# Patient Record
Sex: Female | Born: 1975
Health system: Southern US, Community
[De-identification: ages and names within clinical notes are randomized; demographics above are authoritative.]

## PROBLEM LIST (undated history)

## (undated) DIAGNOSIS — F419 Anxiety disorder, unspecified: Secondary | ICD-10-CM

## (undated) DIAGNOSIS — K648 Other hemorrhoids: Secondary | ICD-10-CM

## (undated) DIAGNOSIS — K219 Gastro-esophageal reflux disease without esophagitis: Secondary | ICD-10-CM

## (undated) DIAGNOSIS — A498 Other bacterial infections of unspecified site: Secondary | ICD-10-CM

## (undated) DIAGNOSIS — F329 Major depressive disorder, single episode, unspecified: Secondary | ICD-10-CM

## (undated) DIAGNOSIS — J45909 Unspecified asthma, uncomplicated: Secondary | ICD-10-CM

## (undated) DIAGNOSIS — N189 Chronic kidney disease, unspecified: Secondary | ICD-10-CM

## (undated) DIAGNOSIS — R87619 Unspecified abnormal cytological findings in specimens from cervix uteri: Secondary | ICD-10-CM

## (undated) DIAGNOSIS — K859 Acute pancreatitis without necrosis or infection, unspecified: Secondary | ICD-10-CM

## (undated) DIAGNOSIS — D219 Benign neoplasm of connective and other soft tissue, unspecified: Secondary | ICD-10-CM

## (undated) DIAGNOSIS — M1990 Unspecified osteoarthritis, unspecified site: Secondary | ICD-10-CM

## (undated) DIAGNOSIS — A879 Viral meningitis, unspecified: Secondary | ICD-10-CM

## (undated) DIAGNOSIS — N390 Urinary tract infection, site not specified: Secondary | ICD-10-CM

## (undated) DIAGNOSIS — T7840XA Allergy, unspecified, initial encounter: Secondary | ICD-10-CM

## (undated) DIAGNOSIS — G43909 Migraine, unspecified, not intractable, without status migrainosus: Secondary | ICD-10-CM

## (undated) DIAGNOSIS — I700 Atherosclerosis of aorta: Secondary | ICD-10-CM

## (undated) DIAGNOSIS — N809 Endometriosis, unspecified: Secondary | ICD-10-CM

## (undated) DIAGNOSIS — N8003 Adenomyosis of the uterus: Secondary | ICD-10-CM

## (undated) DIAGNOSIS — K449 Diaphragmatic hernia without obstruction or gangrene: Secondary | ICD-10-CM

## (undated) DIAGNOSIS — E785 Hyperlipidemia, unspecified: Secondary | ICD-10-CM

## (undated) DIAGNOSIS — I10 Essential (primary) hypertension: Secondary | ICD-10-CM

## (undated) DIAGNOSIS — D649 Anemia, unspecified: Secondary | ICD-10-CM

## (undated) DIAGNOSIS — K5790 Diverticulosis of intestine, part unspecified, without perforation or abscess without bleeding: Secondary | ICD-10-CM

## (undated) DIAGNOSIS — F32A Depression, unspecified: Secondary | ICD-10-CM

## (undated) DIAGNOSIS — M797 Fibromyalgia: Secondary | ICD-10-CM

## (undated) DIAGNOSIS — I639 Cerebral infarction, unspecified: Secondary | ICD-10-CM

## (undated) DIAGNOSIS — D126 Benign neoplasm of colon, unspecified: Secondary | ICD-10-CM

## (undated) DIAGNOSIS — E119 Type 2 diabetes mellitus without complications: Secondary | ICD-10-CM

## (undated) HISTORY — PX: UPPER GASTROINTESTINAL ENDOSCOPY: SHX188

## (undated) HISTORY — DX: Other bacterial infections of unspecified site: A49.8

## (undated) HISTORY — DX: Hyperlipidemia, unspecified: E78.5

## (undated) HISTORY — DX: Unspecified asthma, uncomplicated: J45.909

## (undated) HISTORY — DX: Allergy, unspecified, initial encounter: T78.40XA

## (undated) HISTORY — DX: Atherosclerosis of aorta: I70.0

## (undated) HISTORY — DX: Diverticulosis of intestine, part unspecified, without perforation or abscess without bleeding: K57.90

## (undated) HISTORY — DX: Viral meningitis, unspecified: A87.9

## (undated) HISTORY — DX: Other hemorrhoids: K64.8

## (undated) HISTORY — PX: CERVICAL BIOPSY  W/ LOOP ELECTRODE EXCISION: SUR135

## (undated) HISTORY — DX: Anxiety disorder, unspecified: F41.9

## (undated) HISTORY — DX: Cerebral infarction, unspecified: I63.9

## (undated) HISTORY — DX: Gastro-esophageal reflux disease without esophagitis: K21.9

## (undated) HISTORY — DX: Fibromyalgia: M79.7

## (undated) HISTORY — DX: Endometriosis, unspecified: N80.9

## (undated) HISTORY — DX: Diaphragmatic hernia without obstruction or gangrene: K44.9

## (undated) HISTORY — PX: WISDOM TOOTH EXTRACTION: SHX21

## (undated) HISTORY — DX: Acute pancreatitis without necrosis or infection, unspecified: K85.90

## (undated) HISTORY — PX: COLONOSCOPY: SHX174

## (undated) HISTORY — DX: Anemia, unspecified: D64.9

## (undated) HISTORY — DX: Depression, unspecified: F32.A

## (undated) HISTORY — DX: Major depressive disorder, single episode, unspecified: F32.9

## (undated) HISTORY — DX: Benign neoplasm of colon, unspecified: D12.6

## (undated) HISTORY — DX: Adenomyosis of the uterus: N80.03

## (undated) HISTORY — DX: Benign neoplasm of connective and other soft tissue, unspecified: D21.9

## (undated) HISTORY — DX: Unspecified abnormal cytological findings in specimens from cervix uteri: R87.619

## (undated) HISTORY — DX: Chronic kidney disease, unspecified: N18.9

## (undated) HISTORY — DX: Migraine, unspecified, not intractable, without status migrainosus: G43.909

## (undated) HISTORY — PX: URINARY SURGERY: SHX2626

---
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
## 2000-10-26 HISTORY — PX: OTHER SURGICAL HISTORY: SHX169

## 2009-10-26 HISTORY — PX: CHOLECYSTECTOMY: SHX55

## 2014-06-06 DIAGNOSIS — I10 Essential (primary) hypertension: Secondary | ICD-10-CM | POA: Insufficient documentation

## 2014-06-06 DIAGNOSIS — E559 Vitamin D deficiency, unspecified: Secondary | ICD-10-CM | POA: Insufficient documentation

## 2014-07-03 DIAGNOSIS — R760 Raised antibody titer: Secondary | ICD-10-CM | POA: Insufficient documentation

## 2014-09-12 DIAGNOSIS — IMO0002 Reserved for concepts with insufficient information to code with codable children: Secondary | ICD-10-CM | POA: Insufficient documentation

## 2014-09-12 DIAGNOSIS — E119 Type 2 diabetes mellitus without complications: Secondary | ICD-10-CM | POA: Insufficient documentation

## 2014-09-12 DIAGNOSIS — E1165 Type 2 diabetes mellitus with hyperglycemia: Secondary | ICD-10-CM | POA: Insufficient documentation

## 2016-10-12 ENCOUNTER — Emergency Department (HOSPITAL_COMMUNITY)
Admission: EM | Admit: 2016-10-12 | Discharge: 2016-10-12 | Disposition: A | Discharge status: Home / Self Care | Payer: Self-pay | Attending: Emergency Medicine | Admitting: Emergency Medicine

## 2016-10-12 ENCOUNTER — Telehealth: Payer: Self-pay | Admitting: *Deleted

## 2016-10-12 ENCOUNTER — Encounter (HOSPITAL_COMMUNITY): Payer: Self-pay | Admitting: Emergency Medicine

## 2016-10-12 DIAGNOSIS — L739 Follicular disorder, unspecified: Secondary | ICD-10-CM | POA: Insufficient documentation

## 2016-10-12 DIAGNOSIS — M5431 Sciatica, right side: Secondary | ICD-10-CM | POA: Insufficient documentation

## 2016-10-12 DIAGNOSIS — E119 Type 2 diabetes mellitus without complications: Secondary | ICD-10-CM | POA: Insufficient documentation

## 2016-10-12 DIAGNOSIS — I10 Essential (primary) hypertension: Secondary | ICD-10-CM | POA: Insufficient documentation

## 2016-10-12 DIAGNOSIS — Z79899 Other long term (current) drug therapy: Secondary | ICD-10-CM | POA: Insufficient documentation

## 2016-10-12 DIAGNOSIS — F17210 Nicotine dependence, cigarettes, uncomplicated: Secondary | ICD-10-CM | POA: Insufficient documentation

## 2016-10-12 HISTORY — DX: Essential (primary) hypertension: I10

## 2016-10-12 HISTORY — DX: Type 2 diabetes mellitus without complications: E11.9

## 2016-10-12 HISTORY — DX: Unspecified osteoarthritis, unspecified site: M19.90

## 2016-10-12 HISTORY — DX: Urinary tract infection, site not specified: N39.0

## 2016-10-12 LAB — BASIC METABOLIC PANEL
Anion gap: 7 (ref 5–15)
BUN: 9 mg/dL (ref 6–20)
CO2: 23 mmol/L (ref 22–32)
Calcium: 8.6 mg/dL — ABNORMAL LOW (ref 8.9–10.3)
Chloride: 110 mmol/L (ref 101–111)
Creatinine, Ser: 0.64 mg/dL (ref 0.44–1.00)
GFR calc Af Amer: >60 mL/min (ref >60)
GFR calc non Af Amer: >60 mL/min (ref >60)
Glucose, Bld: 144 mg/dL — ABNORMAL HIGH (ref 65–99)
Potassium: 4.1 mmol/L (ref 3.5–5.1)
Sodium: 140 mmol/L (ref 135–145)

## 2016-10-12 LAB — URINALYSIS, ROUTINE W REFLEX MICROSCOPIC
Bilirubin Urine: NEGATIVE
Glucose, UA: NEGATIVE mg/dL
Hgb urine dipstick: NEGATIVE
Ketones, ur: NEGATIVE mg/dL
Leukocytes, UA: NEGATIVE
Nitrite: NEGATIVE
Protein, ur: NEGATIVE mg/dL
Specific Gravity, Urine: 1.017 (ref 1.005–1.030)
pH: 6 (ref 5.0–8.0)

## 2016-10-12 LAB — CBC WITH DIFFERENTIAL/PLATELET
Basophils Absolute: 0 10*3/uL (ref 0.0–0.1)
Basophils Relative: 0 %
Eosinophils Absolute: 0.1 10*3/uL (ref 0.0–0.7)
Eosinophils Relative: 1 %
HCT: 34.7 % — ABNORMAL LOW (ref 36.0–46.0)
Hemoglobin: 11.5 g/dL — ABNORMAL LOW (ref 12.0–15.0)
Lymphocytes Relative: 30 %
Lymphs Abs: 2.9 10*3/uL (ref 0.7–4.0)
MCH: 28 pg (ref 26.0–34.0)
MCHC: 33.1 g/dL (ref 30.0–36.0)
MCV: 84.6 fL (ref 78.0–100.0)
Monocytes Absolute: 0.6 10*3/uL (ref 0.1–1.0)
Monocytes Relative: 6 %
Neutro Abs: 5.9 10*3/uL (ref 1.7–7.7)
Neutrophils Relative %: 63 %
Platelets: 279 10*3/uL (ref 150–400)
RBC: 4.1 MIL/uL (ref 3.87–5.11)
RDW: 17.2 % — ABNORMAL HIGH (ref 11.5–15.5)
WBC: 9.4 10*3/uL (ref 4.0–10.5)

## 2016-10-12 LAB — I-STAT CG4 LACTIC ACID, ED: Lactic Acid, Venous: 1.34 mmol/L (ref 0.5–1.9)

## 2016-10-12 MED ORDER — OXYCODONE-ACETAMINOPHEN 5-325 MG PO TABS
2.0000 | ORAL_TABLET | Freq: Once | ORAL | Status: AC
  Administered 2016-10-12: 2 via ORAL
  Filled 2016-10-12: qty 2

## 2016-10-12 MED ORDER — FLUCONAZOLE 150 MG PO TABS: 150.0000 mg | ORAL_TABLET | Freq: Once | ORAL | 0 refills | Status: AC

## 2016-10-12 MED ORDER — CHLORHEXIDINE GLUCONATE 4 % EX LIQD: Freq: Every day | CUTANEOUS | 0 refills | Status: DC | PRN

## 2016-10-12 MED ORDER — OXYCODONE-ACETAMINOPHEN 5-325 MG PO TABS: 1.0000 | ORAL_TABLET | Freq: Four times a day (QID) | ORAL | 0 refills | Status: DC | PRN

## 2016-10-12 MED ORDER — DOXYCYCLINE HYCLATE 100 MG PO CAPS: 100.0000 mg | ORAL_CAPSULE | Freq: Two times a day (BID) | ORAL | 0 refills | Status: DC

## 2016-10-12 MED ORDER — CLINDAMYCIN HCL 300 MG PO CAPS: 300.0000 mg | ORAL_CAPSULE | Freq: Three times a day (TID) | ORAL | 0 refills | Status: DC

## 2016-10-12 MED FILL — DOXYCYCLINE 100 MG TABLET: 100 | 7 days supply | Qty: 14 | Fill #0

## 2016-10-12 MED FILL — FLUCONAZOLE 150 MG TABLET: 150 | 1 days supply | Qty: 1 | Fill #0

## 2016-10-12 NOTE — ED Provider Notes (Signed)
TIME SEEN: 5:00 AM  CHIEF COMPLAINT: Multiple complaints  HPI: Pt is a 40 y.o. obese female with history of non-insulin dependent diabetes, hypertension, recurrent urinary tract infections who presents to the emergency department with multiple different complaints. Patient reports that she is here visiting her daughter who is in school here. She states she is here from [place] and states that she thinks that she may establish care here. She is asking for resources to help get her insurance in [place]. States that she had Medicaid in [place].   Patient also complaining of multiple small abscesses to her buttock area that have been present for several months. She has had multiple prescriptions of antibiotics that she has had leftover has tried Bactrim, clindamycin, Cipro. She feels like these areas are getting smaller but that "a new one popped up last night". She denies any fevers, chills. No drainage from these areas. She states that her blood sugars have been under control. She is not on insulin.   Patient also complaining of lower back pain that radiates down her right leg. No numbness, tingling, focal weakness, bowel or bladder incontinence, urinary retention, fever. No history of back surgery or epidural injections. No injury to her back that she is aware of. She is requesting an MRI.   Patient also reports that she has chronic bacterial vaginosis but denies any vaginal discharge or bleeding at this time. She states that metronidazole and clindamycin no longer work for her and she wants another option.  ROS: See HPI Constitutional: no fever  Eyes: no drainage  ENT: no runny nose   Cardiovascular:  no chest pain  Resp: no SOB  GI: no vomiting GU: no hematuria Integumentary: no rash  Allergy: no hives  Musculoskeletal: no leg swelling  Neurological: no slurred speech ROS otherwise negative  PAST MEDICAL HISTORY/PAST SURGICAL HISTORY:  Past Medical History:  Diagnosis Date   . Arthritis   . Chronic UTI   . Diabetes mellitus without complication ([place])   . Hypertension     MEDICATIONS:  Prior to Admission medications   Not on File    ALLERGIES:  Allergies  Allergen Reactions  . Metformin And Related Nausea And Vomiting  . Penicillins   . Xanax [Alprazolam]     SOCIAL HISTORY:  Social History  Substance Use Topics  . Smoking status: Current Every Day Smoker    Packs/day: 1.00    Years: 20.00    Types: Cigarettes  . Smokeless tobacco: Never Used  . Alcohol use Yes    FAMILY HISTORY: History reviewed. No pertinent family history.  EXAM: BP 136/87 (BP Location: Left Arm)   Pulse 83   Temp 98.5 F (36.9 C) (Oral)   Resp 18   Ht 5\' 4"  (1.626 m)   Wt 185 lb (83.9 kg)   LMP 10/02/2016 (Exact Date)   SpO2 100%   BMI 31.76 kg/m  CONSTITUTIONAL: Alert and oriented and responds appropriately to questions. Well-appearing; well-nourished, Obese, afebrile, nontoxic HEAD: Normocephalic EYES: Conjunctivae clear, PERRL, EOMI ENT: normal nose; no rhinorrhea; moist mucous membranes NECK: Supple, no meningismus, no nuchal rigidity, no LAD  CARD: RRR; S1 and S2 appreciated; no murmurs, no clicks, no rubs, no gallops RESP: Normal chest excursion without splinting or tachypnea; breath sounds clear and equal bilaterally; no wheezes, no rhonchi, no rales, no hypoxia or respiratory distress, speaking full sentences ABD/GI: Normal bowel sounds; non-distended; soft, non-tender, no rebound, no guarding, no peritoneal signs, no hepatosplenomegaly BACK:  The back appears normal  and is non-tender to palpation, there is no CVA tenderness EXT: Normal ROM in all joints; non-tender to palpation; no edema; normal capillary refill; no cyanosis, no calf tenderness or swelling    SKIN: Normal color for age and race; warm; no rash, patient has 3 less than 0.5 cm slightly raised erythematous areas to her buttocks with no fluctuance or induration, these areas do not go near  the rectum, no pilonidal cyst NEURO: Moves all extremities equally, sensation to light touch intact diffusely, cranial nerves II through XII intact, normal speech, ambulates with a normal gait without any assistance, no saddle anesthesia PSYCH: The patient's mood and manner are appropriate. Grooming and personal hygiene are appropriate.  MEDICAL DECISION MAKING: Patient here with multiple various complaints that all seem to be nonemergent. Patient states that she is concerned about her back pain. It seems to be sciatica. She has no injury to suggest fracture and no neurologic deficits to suggest cauda equina, epidural abscess or hematoma, discitis, transverse myelitis. I do not feel she needs an emergent MRI but have recommended she follow-up with a PCP for further follow-up. Have advised she continue ibuprofen 800 mg every 8 hours and I will discharge her with a short prescription for Percocet. She drove herself to the emergency department therefore cannot provide her narcotics at this time. She seems very upset that I will not obtain an MRI but have discussed with her at length why she does not need it emergently.   Patient also complaining of these small areas to her buttocks that appear to be folliculitis. She thinks that she is resistant to clindamycin. We'll give her prescription for doxycycline and Hibiclens. There are no areas that need to be incised and drained. No surrounding cellulitis. It does not appear to involve the rectum. There is no soft tissue gas. She is very well-appearing. She has no fevers or systemic symptoms. She denies nausea, vomiting or diarrhea. She states that she thinks that her infection could have spread throughout her body. Discussed with her that we can check her labs today but I have low suspicion for bacteremia.    Patient also asking for something else for her chronic bacterial vaginosis. No current GU complaints. Have recommended over-the-counter boric acid vaginal  suppositories. Abdominal exam is benign.    ED PROGRESS: Patient's labs are reassuring. No leukocytosis. Glucose is 144. Lactate 1.34. I went back into the room to discuss this with patient and reassure her and she now states that she needs her urine evaluated because she thinks she has a UTI. States she has had dysuria but no hematuria or urinary frequency or urgency. We'll add on a urine sample.     Patient's urine shows no ketones, no blood and no sign of infection. Patient's nurse has attempted to discharge her and she has presented with multiple excuses of why she cannot leave including issues with paying for these prescriptions, follow-up. I did discuss with her previously that I will put in a case management and social work consult to help her establish a primary care provider in this area and help her with any outpatient needs. I have given her a list of primary care physicians in this area. Again I suspect that there is no life-threatening illness present and I do not feel she needs further emergent workup. I have previously discussed with her return precautions especially for her back. She verbalized understanding and is comfortable with this plan. I feel she is safe for discharge home.  At this time, I do not feel there is any life-threatening condition present. I have reviewed and discussed all results (EKG, imaging, lab, urine as appropriate) and exam findings with patient/family. I have reviewed nursing notes and appropriate previous records.  I feel the patient is safe to be discharged home without further emergent workup and can continue workup as an outpatient as needed. Discussed usual and customary return precautions. Patient/family verbalize understanding and are comfortable with this plan.  Outpatient follow-up has been provided. All questions have been answered.    [place], DO 10/12/16 [phone]

## 2016-10-12 NOTE — Telephone Encounter (Signed)
Spoke with pt regarding insurance options while visiting [place] and getting Rx filled.  EDCM advised pt that until she becomes a resident to of [place], she can not apply for [place]. [employer]; she may visit the [facility] to have Rx filled.  EDCM advised that she will need to discontinue Medicaid in [place] prior to moving to [facility] and applying for Medicaid in [place].  Pt verbalized understandingby repeating instructions given.

## 2016-10-12 NOTE — Discharge Instructions (Signed)
To find a primary care or specialty doctor please call [phone] or [phone] to access "[facility] Find a Doctor Service." ° °You may also go on the [facility] website at [email or web address] ° °There are also multiple [facility], [facility] and [facility] practices throughout the [facility] that are frequently accepting new patients. You may find a clinic that is close to your home and contact them. ° ° [facility] -  °[address] °[place] [address] °[phone] ° ° °[facility] -  °[address] °[place] [address] °[phone] ° ° °[facility] - °[address]  °[place]  [address] °[phone] ° ° °

## 2016-10-12 NOTE — ED Triage Notes (Signed)
Pt has chronic hx of lower back/R leg pain. Has diabetic sore to R foot and buttocks. Has been on clindamyacin and cipro that have seemed to be helping somewhat for leg pain and one sore to butttocks, but now developing another. Pt has extensive hx and additional complaints. Recently moved from [place].

## 2016-10-20 ENCOUNTER — Emergency Department (HOSPITAL_COMMUNITY)
Admission: EM | Admit: 2016-10-20 | Discharge: 2016-10-20 | Disposition: A | Discharge status: Home / Self Care | Payer: Medicaid - Out of State | Attending: Emergency Medicine | Admitting: Emergency Medicine

## 2016-10-20 DIAGNOSIS — M5441 Lumbago with sciatica, right side: Secondary | ICD-10-CM | POA: Diagnosis not present

## 2016-10-20 DIAGNOSIS — Z760 Encounter for issue of repeat prescription: Secondary | ICD-10-CM | POA: Diagnosis not present

## 2016-10-20 DIAGNOSIS — E119 Type 2 diabetes mellitus without complications: Secondary | ICD-10-CM | POA: Insufficient documentation

## 2016-10-20 DIAGNOSIS — F17210 Nicotine dependence, cigarettes, uncomplicated: Secondary | ICD-10-CM | POA: Diagnosis not present

## 2016-10-20 DIAGNOSIS — I10 Essential (primary) hypertension: Secondary | ICD-10-CM | POA: Diagnosis not present

## 2016-10-20 DIAGNOSIS — M5431 Sciatica, right side: Secondary | ICD-10-CM

## 2016-10-20 MED ORDER — EXENATIDE ER 2 MG ~~LOC~~ PEN: 2.0000 mg | PEN_INJECTOR | SUBCUTANEOUS | 3 refills | Status: DC

## 2016-10-20 MED ORDER — EXENATIDE ER 2 MG/0.85ML ~~LOC~~ AUIJ: 2.0000 mg | AUTO-INJECTOR | SUBCUTANEOUS | 2 refills | Status: DC

## 2016-10-20 MED ORDER — METHYLPREDNISOLONE 4 MG PO TBPK: ORAL_TABLET | ORAL | 0 refills | Status: DC

## 2016-10-20 NOTE — ED Provider Notes (Signed)
[facility] DEPT Provider Note   CSN: [id number] Arrival date & time: 10/20/16  [id number]     History   Chief Complaint Chief Complaint  Patient presents with  . Sciatica    HPI Amber Rose is a 40 y.o. female.  HPI Patient reports she has had several months of lower back pain. It radiates to her right buttock and right leg. It is aching in quality. Pain is worse lying flat. Less painful and the patient is upright and moving about. No weakness numbness to the leg. Patient also has gotten a sporadic lesion on her buttock. She reports she has gotten multiple different antibiotics but it always comes back. Patient is primarily worried about an infection and possibly infection around her spinal cord. She reports that she has had extensive abdominal surgery after a bladder sling that she responded poorly to. She reports after having had such extensive medical problems now she gets concerned about possible complications. The patient has not had any fevers or chills. Blood sugars and A1 C's have been well-controlled. No nausea vomiting diarrhea or abdominal pain. Patient reports recurrent problems with bacterial vaginosis and vaginal yeast. Not acutely having discharge or drainage. No respiratory symptoms. Past Medical History:  Diagnosis Date  . Arthritis   . Chronic UTI   . Diabetes mellitus without complication ([place])   . Hypertension     There are no active problems to display for this patient.   No past surgical history on file.  OB History    No data available       Home Medications    Prior to Admission medications   Medication Sig Start Date End Date Taking? Authorizing Provider  chlorhexidine (HIBICLENS) 4 % external liquid Apply topically daily as needed. 10/12/16   Kristen N Ward, DO  doxycycline (VIBRAMYCIN) 100 MG capsule Take 1 capsule (100 mg total) by mouth 2 (two) times daily. 10/12/16   Kristen N Ward, DO  Exenatide ER (BYDUREON BCISE) 2 MG/0.85ML AUIJ  Inject 2 mg into the skin once a week. 10/20/16   Charlesetta Shanks, MD  Exenatide ER 2 MG PEN Inject 2 mg into the skin once a week. 10/20/16   Charlesetta Shanks, MD  methylPREDNISolone (MEDROL DOSEPAK) 4 MG TBPK tablet Per pack 10/20/16   Charlesetta Shanks, MD  oxyCODONE-acetaminophen (PERCOCET/ROXICET) 5-325 MG tablet Take 1-2 tablets by mouth every 6 (six) hours as needed. 10/12/16   Tama, DO    Family History No family history on file.  Social History Social History  Substance Use Topics  . Smoking status: Current Every Day Smoker    Packs/day: 1.00    Years: 20.00    Types: Cigarettes  . Smokeless tobacco: Never Used  . Alcohol use Yes     Allergies   Metformin and related; Penicillins; and Xanax [alprazolam]   Review of Systems Review of Systems  10 Systems reviewed and are negative for acute change except as noted in the HPI.  Physical Exam Updated Vital Signs BP 114/65 (BP Location: Right Arm)   Pulse 84   Temp 98.5 F (36.9 C)   Resp 16   LMP 10/02/2016 (Exact Date)   SpO2 96%   Physical Exam  Constitutional: She is oriented to person, place, and time. She appears well-developed and well-nourished. No distress.  Patient is alert and well appearance. No respiratory distress.  HENT:  Head: Normocephalic and atraumatic.  Eyes: Conjunctivae and EOM are normal.  Neck: Neck supple.  Cardiovascular: Normal rate, regular  rhythm, normal heart sounds and intact distal pulses.   No murmur heard. Pulmonary/Chest: Effort normal and breath sounds normal. No respiratory distress.  Abdominal: Soft. She exhibits no distension. There is no tenderness. There is no guarding.  Musculoskeletal: Normal range of motion. She exhibits tenderness. She exhibits no edema or deformity.  Patient endorses mild tenderness to palpation at approximately L3 to L5. Also endorses some tenderness to palpation of the paraspinous muscle bodies lateral on the right. No palpable anomaly. The  patient does not seem exquisitely tender. She is able to lie flat without expression of significant discomfort. The patient can go from supine to sitting up without significant discomfort. Strength testing and range of motion of the lower extremities is normal. No peripheral edema or swelling of the extremities. Skin condition of muscle quality of the lower extremities is very good.  Neurological: She is alert and oriented to person, place, and time. She exhibits normal muscle tone. Coordination normal.  Skin: Skin is warm and dry.  Examination of buttocks in the area of the patient's concern, shows only minor areas of hyper pigmentation suggestive of prior folliculitis. All at this time are well-healed without any pustule.  Psychiatric: She has a normal mood and affect.  Nursing note and vitals reviewed.    ED Treatments / Results  Labs (all labs ordered are listed, but only abnormal results are displayed) Labs Reviewed - No data to display  EKG  EKG Interpretation None       Radiology No results found.  Procedures Procedures (including critical care time)  Medications Ordered in ED Medications - No data to display   Initial Impression / Assessment and Plan / ED Course  I have reviewed the triage vital signs and the nursing notes.  Pertinent labs & imaging results that were available during my care of the patient were reviewed by me and considered in my medical decision making (see chart for details).  Clinical Course     Final Clinical Impressions(s) / ED Diagnoses   Final diagnoses:  Sciatica of right side  Medication refill  Patient's pain description is very consistent with sciatica. On physical examination there are no soft tissue anomalies or neurologic anomalies to suggest complications. Patient states primary concern is for infection because she gets this recurrent lesion on her buttock. At this time, there is only hyperpigmentation scarring. Based on the picture  she showed me on the condition of the lesion now, this is very consistent with a recurrent folliculitis. I have very low suspicion for spinal, epidural abscess. The patient has had pain for over several months without deteriorating condition and very well exam at this time. Diagnostic studies were done earlier this week without leukocytosis or UTI. Patient also needed a refill of her diabetes medication. She will try Solu-Medrol. She reports she already has scheduled follow-up on the 27th and the beginning of January.  New Prescriptions New Prescriptions   EXENATIDE ER (BYDUREON BCISE) 2 MG/0.85ML AUIJ    Inject 2 mg into the skin once a week.   EXENATIDE ER 2 MG PEN    Inject 2 mg into the skin once a week.   METHYLPREDNISOLONE (MEDROL DOSEPAK) 4 MG TBPK TABLET    Per pack     Charlesetta Shanks, MD 10/20/16 0830

## 2016-10-23 ENCOUNTER — Ambulatory Visit: Payer: Medicaid - Out of State | Attending: Family Medicine | Admitting: Family Medicine

## 2016-10-23 ENCOUNTER — Encounter: Payer: Self-pay | Admitting: Family Medicine

## 2016-10-23 VITALS — BP 130/82 | HR 74 | Temp 98.5°F | Ht 63.5 in | Wt 185.8 lb

## 2016-10-23 DIAGNOSIS — Z888 Allergy status to other drugs, medicaments and biological substances status: Secondary | ICD-10-CM | POA: Diagnosis not present

## 2016-10-23 DIAGNOSIS — E0800 Diabetes mellitus due to underlying condition with hyperosmolarity without nonketotic hyperglycemic-hyperosmolar coma (NKHHC): Secondary | ICD-10-CM

## 2016-10-23 DIAGNOSIS — E119 Type 2 diabetes mellitus without complications: Secondary | ICD-10-CM | POA: Insufficient documentation

## 2016-10-23 DIAGNOSIS — I10 Essential (primary) hypertension: Secondary | ICD-10-CM | POA: Diagnosis not present

## 2016-10-23 DIAGNOSIS — Z880 Allergy status to penicillin: Secondary | ICD-10-CM | POA: Diagnosis not present

## 2016-10-23 DIAGNOSIS — L0292 Furuncle, unspecified: Secondary | ICD-10-CM | POA: Diagnosis not present

## 2016-10-23 DIAGNOSIS — L0232 Furuncle of buttock: Secondary | ICD-10-CM | POA: Insufficient documentation

## 2016-10-23 DIAGNOSIS — M79604 Pain in right leg: Secondary | ICD-10-CM | POA: Insufficient documentation

## 2016-10-23 DIAGNOSIS — R232 Flushing: Secondary | ICD-10-CM | POA: Insufficient documentation

## 2016-10-23 DIAGNOSIS — L732 Hidradenitis suppurativa: Secondary | ICD-10-CM | POA: Insufficient documentation

## 2016-10-23 LAB — POCT GLYCOSYLATED HEMOGLOBIN (HGB A1C): Hemoglobin A1C: 5.6

## 2016-10-23 LAB — GLUCOSE, POCT (MANUAL RESULT ENTRY): POC Glucose: 117 mg/dl — AB (ref 70–99)

## 2016-10-23 MED ORDER — ACYCLOVIR 400 MG PO TABS: 400.0000 mg | ORAL_TABLET | Freq: Two times a day (BID) | ORAL | 3 refills | Status: DC

## 2016-10-23 MED ORDER — HYDROCHLOROTHIAZIDE 25 MG PO TABS: 25.0000 mg | ORAL_TABLET | Freq: Every day | ORAL | 3 refills | Status: DC

## 2016-10-23 MED ORDER — AMLODIPINE BESYLATE 10 MG PO TABS: 10.0000 mg | ORAL_TABLET | Freq: Every day | ORAL | 3 refills | Status: DC

## 2016-10-23 MED FILL — HYDROCHLOROTHIAZIDE 25 MG T: 25 | 30 days supply | Qty: 30 | Fill #0

## 2016-10-23 NOTE — Progress Notes (Signed)
Subjective:  Patient ID: Amber Rose, female    DOB: 12-May-1976  Age: 40 y.o. MRN: [id number]  CC: Leg Pain (right sided); lesion on left buttocks (present for 2 months); Facial Swelling; Diabetes; and Hypertension   HPI Amber Rose is a 40 year old female with a history of diabetes mellitus (A1c 5.6), hypertension who presents today to get established here; she recently moved from [place]. She was seen in the ED for sciatica and received a Medrol Dosepak which she is yet to commence. Describes the pain as in the right side of her lower back and radiates down her thigh medially.  She also complains of flushing and swelling of her face whenever she lies down and this begins as a sensation of pulsation then warms and subsequent swelling of the face but this resolved with she sits up. This has been intermittent and has been going on for years. She informs me that workup by her previous physicians have been nonrevealing.  Also concerned about a bump on her left butt cheek, left arm which have imprisoned for the last 1 month and seemed to improve when she was on an antibiotic but have not healed completely. She also has a mole on her face which has happened for years but thinks it is getting bigger; she does not want excision of the lesion but just thought she should mention it.  Past Medical History:  Diagnosis Date  . Arthritis   . Chronic UTI   . Diabetes mellitus without complication ([facility])   . Hypertension     History reviewed. No pertinent surgical history.  Allergies  Allergen Reactions  . Metformin And Related Nausea And Vomiting  . Penicillins   . Xanax [Alprazolam]      Outpatient Medications Prior to Visit  Medication Sig Dispense Refill  . Exenatide ER (BYDUREON BCISE) 2 MG/0.85ML AUIJ Inject 2 mg into the skin once a week. 4 pen 2  . chlorhexidine (HIBICLENS) 4 % external liquid Apply topically daily as needed. (Patient not taking: Reported on 10/23/2016)  120 mL 0  . methylPREDNISolone (MEDROL DOSEPAK) 4 MG TBPK tablet Per pack (Patient not taking: Reported on 10/23/2016) 21 tablet 0  . oxyCODONE-acetaminophen (PERCOCET/ROXICET) 5-325 MG tablet Take 1-2 tablets by mouth every 6 (six) hours as needed. (Patient not taking: Reported on 10/23/2016) 15 tablet 0  . doxycycline (VIBRAMYCIN) 100 MG capsule Take 1 capsule (100 mg total) by mouth 2 (two) times daily. 14 capsule 0  . Exenatide ER 2 MG PEN Inject 2 mg into the skin once a week. 4 each 3   No facility-administered medications prior to visit.     ROS Review of Systems  Constitutional: Negative for activity change, appetite change and fatigue.  HENT: Negative for congestion, sinus pressure and sore throat.   Eyes: Negative for visual disturbance.  Respiratory: Negative for cough, chest tightness, shortness of breath and wheezing.   Cardiovascular: Negative for chest pain and palpitations.  Gastrointestinal: Negative for abdominal distention, abdominal pain and constipation.  Endocrine: Negative for polydipsia.  Genitourinary: Negative for dysuria and frequency.  Musculoskeletal: Negative for arthralgias and back pain.  Skin: Positive for rash.  Neurological: Negative for tremors, light-headedness and numbness.  Hematological: Does not bruise/bleed easily.  Psychiatric/Behavioral: Negative for agitation and behavioral problems.    Objective:  BP 130/82 (BP Location: Right Arm, Patient Position: Sitting, Cuff Size: Small)   Pulse 74   Temp 98.5 F (36.9 C) (Oral)   Ht 5' 3.5" (1.613 m)  Wt 185 lb 12.8 oz (84.3 kg)   LMP 10/02/2016 (Exact Date)   SpO2 98%   BMI 32.40 kg/m   BP/Weight 10/23/2016 10/20/2016 [id number]  Systolic BP [id number]  Diastolic BP 82 65 71  Wt. (Lbs) 185.8 - 185  BMI 32.4 - 31.76      Physical Exam  Constitutional: She is oriented to person, place, and time. She appears well-developed and well-nourished.  Cardiovascular: Normal rate, normal  heart sounds and intact distal pulses.   No murmur heard. Pulmonary/Chest: Effort normal and breath sounds normal. She has no wheezes. She has no rales. She exhibits no tenderness.  Abdominal: Soft. Bowel sounds are normal. She exhibits no distension and no mass. There is no tenderness.  Musculoskeletal: Normal range of motion.  Neurological: She is alert and oriented to person, place, and time.  Skin:  Papule on the medial aspect of left butt check and in the upper arm with no discharge Hyperpigmented mole on right side of cheek which is pedunculated and a second smaller mole above that     Lab Results  Component Value Date   HGBA1C 5.6 10/23/2016    Assessment & Plan:   1. Diabetes mellitus due to underlying condition with hyperosmolarity without coma, without long-term current use of insulin (HCC) Controlled with A1c of 5.6 Continue medications and diabetic diet - Glucose (CBG) - HgB A1c - Microalbumin / creatinine urine ratio; Future - Lipid Panel w/reflex Direct LDL; Future - COMPLETE METABOLIC PANEL WITH GFR; Future - acyclovir (ZOVIRAX) 400 MG tablet; Take 1 tablet (400 mg total) by mouth 2 (two) times daily.  Dispense: 60 tablet; Refill: 3  2. Essential hypertension Controlled Continue lisinopril Switched from amlodipine to hydrochlorothiazide due to flushing and edema  3. Furunculosis Patient advised to use warm compress, bleach baths Will need to be monitored to exclude hydradenitis  4. Flushing Could be secondary to amlodipine which I have discontinued Will exclude underlying autoimmune condition. - Sedimentation Rate; Future - Rheumatoid factor; Future - ANA; Future - Anti-DNA antibody, double-stranded; Future   Advised to apply for the [facility] card which will help facilitate referral to dermatology for evaluation of the moles  Meds ordered this encounter  Medications  . DISCONTD: amLODipine (NORVASC) 10 MG tablet    Sig: Take 1 tablet (10 mg total) by  mouth daily.    Dispense:  30 tablet    Refill:  3  . DISCONTD: acyclovir (ZOVIRAX) 400 MG tablet    Sig: Take 1 tablet (400 mg total) by mouth 2 (two) times daily.    Dispense:  60 tablet    Refill:  3  . hydrochlorothiazide (HYDRODIURIL) 25 MG tablet    Sig: Take 1 tablet (25 mg total) by mouth daily.    Dispense:  30 tablet    Refill:  3  . acyclovir (ZOVIRAX) 400 MG tablet    Sig: Take 1 tablet (400 mg total) by mouth 2 (two) times daily.    Dispense:  60 tablet    Refill:  3    Follow-up: Return in about 3 weeks (around 11/13/2016) for follow up of chronic medical conditions.   Arnoldo Morale MD

## 2016-10-28 ENCOUNTER — Ambulatory Visit: Payer: Medicaid Other | Attending: Family Medicine

## 2016-10-28 ENCOUNTER — Other Ambulatory Visit: Payer: Self-pay | Admitting: Family Medicine

## 2016-10-28 ENCOUNTER — Telehealth: Payer: Self-pay | Admitting: Family Medicine

## 2016-10-28 DIAGNOSIS — R232 Flushing: Secondary | ICD-10-CM | POA: Insufficient documentation

## 2016-10-28 DIAGNOSIS — E0800 Diabetes mellitus due to underlying condition with hyperosmolarity without nonketotic hyperglycemic-hyperosmolar coma (NKHHC): Secondary | ICD-10-CM | POA: Insufficient documentation

## 2016-10-28 LAB — COMPLETE METABOLIC PANEL WITH GFR
ALT: 9 U/L (ref 6–29)
AST: 11 U/L (ref 10–30)
Albumin: 4 g/dL (ref 3.6–5.1)
Alkaline Phosphatase: 75 U/L (ref 33–115)
BUN: 13 mg/dL (ref 7–25)
CO2: 23 mmol/L (ref 20–31)
Calcium: 9.1 mg/dL (ref 8.6–10.2)
Chloride: 110 mmol/L (ref 98–110)
Creat: 0.64 mg/dL (ref 0.50–1.10)
GFR, Est African American: >89 mL/min (ref >=60)
GFR, Est Non African American: >89 mL/min (ref >=60)
Glucose, Bld: 95 mg/dL (ref 65–99)
Potassium: 4.5 mmol/L (ref 3.5–5.3)
Sodium: 142 mmol/L (ref 135–146)
Total Bilirubin: 0.2 mg/dL (ref 0.2–1.2)
Total Protein: 6.6 g/dL (ref 6.1–8.1)

## 2016-10-28 LAB — LIPID PANEL W/REFLEX DIRECT LDL
Cholesterol: 198 mg/dL (ref <200)
HDL: 45 mg/dL — ABNORMAL LOW (ref >50)
LDL-Cholesterol: 134 mg/dL — ABNORMAL HIGH
Non-HDL Cholesterol (Calc): 153 mg/dL — ABNORMAL HIGH (ref <130)
Total CHOL/HDL Ratio: 4.4 Ratio (ref <5.0)
Triglycerides: 93 mg/dL (ref <150)

## 2016-10-28 MED ORDER — METHYLPREDNISOLONE 4 MG PO TBPK: ORAL_TABLET | ORAL | 0 refills | Status: DC

## 2016-10-28 MED ORDER — EXENATIDE ER 2 MG/0.85ML ~~LOC~~ AUIJ: 2.0000 mg | AUTO-INJECTOR | SUBCUTANEOUS | 2 refills | Status: DC

## 2016-10-28 MED FILL — METHYLPREDNISOLONE 4 MG TAB: 4 | 6 days supply | Qty: 21 | Fill #0

## 2016-10-28 NOTE — Telephone Encounter (Signed)
Error

## 2016-10-28 NOTE — Progress Notes (Signed)
Patient here for lab visit only 

## 2016-10-29 ENCOUNTER — Ambulatory Visit: Payer: PRIVATE HEALTH INSURANCE

## 2016-10-29 LAB — MICROALBUMIN / CREATININE URINE RATIO
Creatinine, Urine: 158 mg/dL (ref 20–320)
Microalb Creat Ratio: 10 mcg/mg creat (ref <30)
Microalb, Ur: 1.6 mg/dL

## 2016-10-29 LAB — ANA: Anti Nuclear Antibody(ANA): NEGATIVE

## 2016-10-29 LAB — ANTI-DNA ANTIBODY, DOUBLE-STRANDED: ds DNA Ab: 1 IU/mL

## 2016-10-29 LAB — RHEUMATOID FACTOR: Rhuematoid fact SerPl-aCnc: <14 IU/mL (ref <14)

## 2016-10-29 LAB — SEDIMENTATION RATE: Sed Rate: 11 mm/hr (ref 0–20)

## 2016-11-02 ENCOUNTER — Other Ambulatory Visit: Payer: Self-pay | Admitting: Family Medicine

## 2016-11-02 MED ORDER — ATORVASTATIN CALCIUM 40 MG PO TABS: 40.0000 mg | ORAL_TABLET | Freq: Every day | ORAL | 3 refills | Status: DC

## 2016-11-05 ENCOUNTER — Telehealth: Payer: Self-pay

## 2016-11-05 NOTE — Telephone Encounter (Signed)
Writer contacted patient per Dr. Jarold Song to discuss lab results.  Patient stated understanding and will pick medication up and start it.

## 2016-11-05 NOTE — Telephone Encounter (Signed)
-----   Message from Arnoldo Morale, MD sent at 11/02/2016  8:49 AM EST ----- Autoimmune labs are negative; LDL is elevated, which increases her cardiovascular risk and I have sent a rx for atorvastatin to the pharmacy in house for her.

## 2016-11-18 ENCOUNTER — Ambulatory Visit: Payer: Medicaid Other | Attending: Family Medicine | Admitting: Physician Assistant

## 2016-11-18 VITALS — BP 109/66 | HR 74 | Temp 99.0°F | Resp 18 | Ht 63.0 in | Wt 182.6 lb

## 2016-11-18 DIAGNOSIS — R300 Dysuria: Secondary | ICD-10-CM | POA: Insufficient documentation

## 2016-11-18 DIAGNOSIS — E119 Type 2 diabetes mellitus without complications: Secondary | ICD-10-CM | POA: Insufficient documentation

## 2016-11-18 DIAGNOSIS — M1990 Unspecified osteoarthritis, unspecified site: Secondary | ICD-10-CM | POA: Insufficient documentation

## 2016-11-18 DIAGNOSIS — Z880 Allergy status to penicillin: Secondary | ICD-10-CM | POA: Diagnosis not present

## 2016-11-18 DIAGNOSIS — Z79899 Other long term (current) drug therapy: Secondary | ICD-10-CM | POA: Insufficient documentation

## 2016-11-18 DIAGNOSIS — L03317 Cellulitis of buttock: Secondary | ICD-10-CM | POA: Diagnosis not present

## 2016-11-18 DIAGNOSIS — I10 Essential (primary) hypertension: Secondary | ICD-10-CM | POA: Insufficient documentation

## 2016-11-18 DIAGNOSIS — Z888 Allergy status to other drugs, medicaments and biological substances status: Secondary | ICD-10-CM | POA: Insufficient documentation

## 2016-11-18 DIAGNOSIS — J3489 Other specified disorders of nose and nasal sinuses: Secondary | ICD-10-CM | POA: Insufficient documentation

## 2016-11-18 DIAGNOSIS — Z87440 Personal history of urinary (tract) infections: Secondary | ICD-10-CM | POA: Insufficient documentation

## 2016-11-18 DIAGNOSIS — K1370 Unspecified lesions of oral mucosa: Secondary | ICD-10-CM | POA: Diagnosis present

## 2016-11-18 LAB — POCT URINALYSIS DIPSTICK
Bilirubin, UA: NEGATIVE
Glucose, UA: NEGATIVE
Ketones, UA: NEGATIVE
Leukocytes, UA: NEGATIVE
Nitrite, UA: NEGATIVE
Protein, UA: NEGATIVE
Spec Grav, UA: 1.01
Urobilinogen, UA: 0.2
pH, UA: 5.5

## 2016-11-18 MED ORDER — AMLODIPINE BESYLATE 10 MG PO TABS: 10.0000 mg | ORAL_TABLET | Freq: Every day | ORAL | 3 refills | Status: DC

## 2016-11-18 MED ORDER — MUPIROCIN 2 % EX OINT: TOPICAL_OINTMENT | CUTANEOUS | 0 refills | Status: DC

## 2016-11-18 MED FILL — MUPIROCIN 2% OINTMENT: 2 | 22 days supply | Qty: 22 | Fill #0

## 2016-11-18 MED FILL — AMLODIPINE BESYLATE 10 MG T: 10 | 30 days supply | Qty: 30 | Fill #0

## 2016-11-18 MED FILL — BYDUREON 2 MG PEN INJECT: 2 | 30 days supply | Qty: 4 | Fill #0

## 2016-11-18 NOTE — Progress Notes (Signed)
Patient is here for sore throat symptoms  Patient is here for Uti symptoms  Also Patient stated that she has a sore in her buttocks

## 2016-11-18 NOTE — Progress Notes (Signed)
Amber Rose, is a 41 y.o. female  [id number]  [id number]  DOB - 26-Apr-1976  Subjective:  Chief Complaint and HPI: Amber Rose is a 41 y.o. female here today for several concerns. 1) sores in her mouth under the L side of her tongue and L sided ST on and off for about 2 weeks.  Salt water gargles have been helpful.  No f/c.  No other URI s/sx.  Blood sugars controlled-last A1C a few weeks ago and was 5.6.  2)Dysuria on and off for a few days.  She is currently on her period.  She has had a lot of bladder problems over the last year and a half.  She has had a bladder sling  And multiple other surgeries with ultimate removal of the sling.  She has had recurrent UTIs and bladder pain since these problems began.  3) since around November, she has had several boils on her buttocks.  She is s smoker.  She resumed amlodipine because she was not tolerating HCTZ.  She needs a RF on the amlodipine.  She denies any side effects or further facial swelling since resuming it.    PHQ9 and GAD 7 scores are reviewed.  #9=1.  She is not having current SI.  She has passive suicidal thoughts without any plan or intent.  She is actually working with a counselor who has been very helpful for her and has an appointment with her today.  She is not interested in meeting with our social worker today but our services were offered.  There are no weapons in the home.     ROS:   Constitutional:  No f/c, No night sweats, No unexplained weight loss. EENT:  No vision changes, No blurry vision, No hearing changes. +mouth sore and ST Respiratory: No cough, No SOB Cardiac: No CP, no palpitations GI:  No abd pain, No N/V/D. GU: + Urinary s/sx as above Musculoskeletal: No joint pain Neuro: No headache, no dizziness, no motor weakness.  Skin: No rash Endocrine:  No polydipsia. No polyuria.  Psych: Denies SI/HI  No problems updated.  ALLERGIES: Allergies  Allergen Reactions  . Metformin And Related  Nausea And Vomiting  . Penicillins   . Xanax [Alprazolam]     PAST MEDICAL HISTORY: Past Medical History:  Diagnosis Date  . Arthritis   . Chronic UTI   . Diabetes mellitus without complication ([place])   . Hypertension     MEDICATIONS AT HOME: Prior to Admission medications   Medication Sig Start Date End Date Taking? Authorizing Provider  acyclovir (ZOVIRAX) 400 MG tablet Take 1 tablet (400 mg total) by mouth 2 (two) times daily. 10/23/16   Arnoldo Morale, MD  amLODipine (NORVASC) 10 MG tablet Take 1 tablet (10 mg total) by mouth daily. 11/18/16   Argentina Donovan, PA-C  atorvastatin (LIPITOR) 40 MG tablet Take 1 tablet (40 mg total) by mouth daily. 11/02/16   Arnoldo Morale, MD  chlorhexidine (HIBICLENS) 4 % external liquid Apply topically daily as needed. Patient not taking: Reported on 10/23/2016 10/12/16   Kristen N Ward, DO  diazepam (VALIUM) 5 MG tablet Take 5 mg by mouth every 6 (six) hours as needed for anxiety.    Historical Provider, MD  Exenatide ER (BYDUREON BCISE) 2 MG/0.85ML AUIJ Inject 2 mg into the skin once a week. 10/28/16   Arnoldo Morale, MD  hydrochlorothiazide (HYDRODIURIL) 25 MG tablet Take 1 tablet (25 mg total) by mouth daily. 10/23/16   Arnoldo Morale, MD  ibuprofen (ADVIL,MOTRIN) 800 MG tablet Take 800 mg by mouth every 8 (eight) hours as needed.    Historical Provider, MD  lisinopril (PRINIVIL,ZESTRIL) 20 MG tablet Take 20 mg by mouth daily.    Historical Provider, MD  methylPREDNISolone (MEDROL DOSEPAK) 4 MG TBPK tablet Per pack 10/28/16   Arnoldo Morale, MD  mupirocin ointment (BACTROBAN) 2 % Apply to AA bid X 1 week 11/18/16   Argentina Donovan, PA-C  oxyCODONE-acetaminophen (PERCOCET/ROXICET) 5-325 MG tablet Take 1-2 tablets by mouth every 6 (six) hours as needed. Patient not taking: Reported on 10/23/2016 10/12/16   Delice Bison Ward, DO     Objective:  EXAM:   Vitals:   11/18/16 1113  BP: 109/66  Pulse: 74  Resp: 18  Temp: 99 F (37.2 C)  TempSrc: Oral  SpO2:  98%  Weight: 182 lb 9.6 oz (82.8 kg)  Height: 5\' 3"  (1.6 m)    General appearance : A&OX3. NAD. Non-toxic-appearing HEENT: Atraumatic and Normocephalic.  PERRLA. EOM intact.  TM clear B. Mouth-MMM, post pharynx WNL w/o erythema, uvula midline.  Tonsils appear normal.  There is an apthous ulcer under the L tongue.  No PND. Neck: supple, no JVD. No cervical lymphadenopathy. No thyromegaly Chest/Lungs:  Breathing-non-labored, Good air entry bilaterally, breath sounds normal without rales, rhonchi, or wheezing  CVS: S1 S2 regular, no murmurs, gallops, rubs  2 non-infected firm boils in the gluteal cleft. Extremities: Bilateral Lower Ext shows no edema, both legs are warm to touch with = pulse throughout Neurology:  CN II-XII grossly intact, Non focal.   Psych:  TP linear. J/I WNL. Normal speech. Appropriate eye contact and affect.  Skin:  No Rash  Data Review Lab Results  Component Value Date   HGBA1C 5.6 10/23/2016     Assessment & Plan   1. Hypertension, unspecified type Controlled - amLODipine (NORVASC) 10 MG tablet; Take 1 tablet (10 mg total) by mouth daily.  Dispense: 90 tablet; Refill: 3  2. Cellulitis of buttock These are not actively infected.  Appear more as supprative hydradenitis - mupirocin ointment (BACTROBAN) 2 %; Apply to AA bid X 1 week  Dispense: 22 g; Refill: 0 I have advised her to use this on the 2 skin lesions as well as intranasally. Smoking cessation imperative for skin health.  3. Dysuria UA WNL (except blood but on menses) - Urinalysis Dipstick   F/up Dr Jarold Song for DM and Htn in 10 weeks  Patient have been counseled extensively about nutrition and exercise  The patient was given clear instructions to go to ER or return to medical center if symptoms don't improve, worsen or new problems develop. The patient verbalized understanding. The patient was told to call to get lab results if they haven't heard anything in the next week.     Freeman Caldron,  PA-C [facility] and [place], [place]   11/18/2016, 11:37 AMPatient ID: Amber Rose, female   DOB: 1976-07-06, 41 y.o.   MRN: [id number]

## 2016-11-24 ENCOUNTER — Ambulatory Visit: Payer: Self-pay | Admitting: Family Medicine

## 2016-12-07 ENCOUNTER — Encounter: Payer: Self-pay | Admitting: Physician Assistant

## 2016-12-07 ENCOUNTER — Other Ambulatory Visit (INDEPENDENT_AMBULATORY_CARE_PROVIDER_SITE_OTHER): Payer: Self-pay | Admitting: Physician Assistant

## 2016-12-07 ENCOUNTER — Ambulatory Visit: Payer: Medicaid Other | Attending: Physician Assistant | Admitting: Physician Assistant

## 2016-12-07 VITALS — BP 103/68 | HR 108 | Temp 98.6°F | Resp 18 | Ht 63.0 in | Wt 176.2 lb

## 2016-12-07 DIAGNOSIS — X58XXXD Exposure to other specified factors, subsequent encounter: Secondary | ICD-10-CM | POA: Insufficient documentation

## 2016-12-07 DIAGNOSIS — L0232 Furuncle of buttock: Secondary | ICD-10-CM | POA: Diagnosis not present

## 2016-12-07 DIAGNOSIS — T783XXD Angioneurotic edema, subsequent encounter: Secondary | ICD-10-CM

## 2016-12-07 DIAGNOSIS — E119 Type 2 diabetes mellitus without complications: Secondary | ICD-10-CM | POA: Insufficient documentation

## 2016-12-07 DIAGNOSIS — I10 Essential (primary) hypertension: Secondary | ICD-10-CM | POA: Diagnosis not present

## 2016-12-07 DIAGNOSIS — N898 Other specified noninflammatory disorders of vagina: Secondary | ICD-10-CM

## 2016-12-07 LAB — POCT URINE PREGNANCY: Preg Test, Ur: NEGATIVE

## 2016-12-07 MED ORDER — DOXYCYCLINE HYCLATE 100 MG PO TABS
100.0000 mg | ORAL_TABLET | Freq: Two times a day (BID) | ORAL | 0 refills | Status: DC
Start: 2016-12-07 — End: 2016-12-25

## 2016-12-07 MED ORDER — AZITHROMYCIN 500 MG PO TABS: 500.0000 mg | ORAL_TABLET | Freq: Once | ORAL | 0 refills | Status: AC

## 2016-12-07 MED ORDER — METRONIDAZOLE 500 MG PO TABS: 500.0000 mg | ORAL_TABLET | Freq: Three times a day (TID) | ORAL | 0 refills | Status: DC

## 2016-12-07 MED FILL — ?ACYCLOVIR 400 MG TABLET: 400 | 30 days supply | Qty: 60 | Fill #0

## 2016-12-07 NOTE — Progress Notes (Signed)
Patient is here for Sores  Patient complains of site on her buttocks getting bigger. Patient complains of right leg pain being present.  Patient does complain of a pasty white discharge being present for the past week. Patient denies any itching, irritation or odor at this time.  Patient has taken medication today. Patient has eaten today.

## 2016-12-07 NOTE — Patient Instructions (Signed)
Angioedema  Angioedema is the sudden swelling of tissue in the body. Angioedema can affect any part of the body, but it most often affects the deeper parts of the skin, causing red, itchy patches (hives) to appear over the affected area. It often begins during the night and is found in the morning. Depending on the cause, angioedema may happen:  Only once.  Several times. It may come back in unpredictable patterns.  Repeatedly for several years. Over time, it may gradually stop coming back.  Angioedema can be life-threatening if it affects the air passages that you breathe through. What are the causes? This condition may be caused by:  Foods, such as milk, eggs, shellfish, wheat, or nuts.  Certain medicines, such as ACE inhibitors, antibiotics, nonsteroidal anti-inflammatory drugs, birth control pills, or dyes used in X-rays.  Insect stings.  Infections.  Angioedema can be inherited, and episodes can be triggered by:  Mild injury.  Dental work.  Surgery.  Stress.  Sudden changes in temperature.  Exercise.  In some cases, the cause of this condition is not known. What are the signs or symptoms? Symptoms of this condition depend on where the swelling happens. Symptoms may include:  Swollen skin.  Red, itchy patches of skin (hives).  Redness in the affected area.  Pain in the affected area.  Swollen lips or tongue.  Wheezing.  Breathing problems.  If your internal organs are involved, symptoms may also include:  Nausea.  Abdominal pain.  Vomiting.  Difficulty swallowing.  Difficulty passing urine.  How is this diagnosed? This condition may be diagnosed based on:  An exam of the affected area.  Your medical history.  Whether anyone in your family has had this condition before.  A review of any medicines you have been taking.  Tests, including: ? Allergy skin tests to see if the condition was caused by an allergic reaction. ? Blood tests to  see if the condition was caused by a gene. ? Tests to check for underlying diseases that could cause the condition.  How is this treated? Treatment for this condition depends on the cause. It may involve any of the following:  If something triggered the condition, making changes to keep it from triggering the condition again.  If the condition affects your breathing, having tubes placed in your airway to keep it open.  Taking medicines to treat symptoms or prevent future episodes. These may include: ? Antihistamines. ? Epinephrine injections. ? Steroids.  If your condition is severe, you may need to be treated at the hospital. Angioedema usually gets better in 24-48 hours. Follow these instructions at home:  Take over-the-counter and prescription medicines only as told by your health care provider.  If you were given medicines for emergency allergy treatment, always carry them with you.  Wear a medical bracelet as told by your health care provider.  If something triggers your condition, avoid the trigger, if possible.  If your condition is inherited and you are thinking about having children, talk to your health care provider. It is important to discuss the risks of passing on the condition to your children. Contact a health care provider if:  You have repeated episodes of angioedema.  Episodes of angioedema start to happen more often than they used to, even after you take steps to prevent them.  You have episodes of angioedema that are more severe than they have been before, even after you take steps to prevent them.  You are thinking about having children.   severe swelling of your mouth, tongue, or lips.  You have trouble breathing.  You have trouble swallowing.  You faint. This information is not intended to replace advice given to you by your health care provider. Make sure you discuss any questions you have with your health care provider. Document  Released: 12/21/2001 Document Revised: 05/09/2016 Document Reviewed: 04/21/2016 Elsevier Interactive Patient Education  2017 Elsevier Inc.  

## 2016-12-07 NOTE — Progress Notes (Signed)
Patient ID: Dariona Postma, female   DOB: 02-04-1976, 41 y.o.   MRN: [id number]    Subjective:  Patient ID: Venita Seng, female    DOB: [birth year]  Age: 41 y.o. MRN: [id number]  CC:  Lesion on left buttock  HPI Kalisa Girtman is a 41 y.o. female with a PMH of DM and HTN that presents with a skin lesion on the left buttocks that has been resolving slowly over time with the use of oral and topical antibiotics, but is concerned of a new lesion that does not seem to be resolving. The new lesion is nodular and dark in color. Does not endorse surrounding erythema, pain, streaking, or fever. She would also like to have testing to figure out the cause of her angioedema. She has moved here from [place] and has noticed that she has an increased frequency of angioedema to the face approximately once a month which has increased from approximately 1 episode every 3-6 months. Denies anaphylaxis. Has not identified any triggers. Previous ANA and ESR negative/normal. Lastly she is having vaginal discharge and believes she has BV. She has had multiple episodes of BV, however, she notes the vaginal discharge is somewhat different in color and viscosity. Denies fever, chills, bleeding, genital lesions, abdominal pain, rash, GI/GU symptoms   Outpatient Medications Prior to Visit  Medication Sig Dispense Refill  . acyclovir (ZOVIRAX) 400 MG tablet Take 1 tablet (400 mg total) by mouth 2 (two) times daily. 60 tablet 3  . amLODipine (NORVASC) 10 MG tablet Take 1 tablet (10 mg total) by mouth daily. 90 tablet 3  . atorvastatin (LIPITOR) 40 MG tablet Take 1 tablet (40 mg total) by mouth daily. 30 tablet 3  . diazepam (VALIUM) 5 MG tablet Take 5 mg by mouth every 6 (six) hours as needed for anxiety.    . Exenatide ER (BYDUREON BCISE) 2 MG/0.85ML AUIJ Inject 2 mg into the skin once a week. 4 pen 2  . ibuprofen (ADVIL,MOTRIN) 800 MG tablet Take 800 mg by mouth every 8 (eight) hours as needed.    Marland Kitchen lisinopril  (PRINIVIL,ZESTRIL) 20 MG tablet Take 20 mg by mouth daily.    . mupirocin ointment (BACTROBAN) 2 % Apply to AA bid X 1 week 22 g 0  . chlorhexidine (HIBICLENS) 4 % external liquid Apply topically daily as needed. (Patient not taking: Reported on 10/23/2016) 120 mL 0  . hydrochlorothiazide (HYDRODIURIL) 25 MG tablet Take 1 tablet (25 mg total) by mouth daily. (Patient not taking: Reported on 12/07/2016) 30 tablet 3  . methylPREDNISolone (MEDROL DOSEPAK) 4 MG TBPK tablet Per pack (Patient not taking: Reported on 12/07/2016) 21 tablet 0  . oxyCODONE-acetaminophen (PERCOCET/ROXICET) 5-325 MG tablet Take 1-2 tablets by mouth every 6 (six) hours as needed. (Patient not taking: Reported on 10/23/2016) 15 tablet 0   No facility-administered medications prior to visit.      ROS Review of Systems  Constitutional: Negative for chills, fever and malaise/fatigue.  Eyes: Negative for blurred vision.  Respiratory: Negative for cough and shortness of breath.   Cardiovascular: Negative for chest pain and palpitations.  Gastrointestinal: Negative for abdominal pain and nausea.  Genitourinary: Negative for dysuria and hematuria.       Vaginal discharge  Musculoskeletal: Negative for joint pain and myalgias.  Skin: Negative for rash.       Skin lesion on left buttocks  Neurological: Negative for tingling and headaches.  Psychiatric/Behavioral: Negative for depression. The patient is not nervous/anxious.     Objective:  BP 103/68 (BP Location: Left Arm, Patient Position: Sitting, Cuff Size: Large)   Pulse (!) 108   Temp 98.6 F (37 C) (Oral)   Resp 18   Ht '5\' 3"'  (1.6 m)   Wt 176 lb 3.2 oz (79.9 kg)   LMP 11/16/2016   SpO2 99%   BMI 31.21 kg/m   BP/Weight 12/07/2016 11/18/2016 30/06/2329  Systolic BP 076 226 333  Diastolic BP 68 66 82  Wt. (Lbs) 176.2 182.6 185.8  BMI 31.21 32.35 32.4      Physical Exam  Constitutional: She is oriented to person, place, and time.  Well developed, well  nourished, NAD, polite  HENT:  Head: Normocephalic and atraumatic.  Eyes: No scleral icterus.  Neck: Normal range of motion. Neck supple. No thyromegaly present.  Cardiovascular: Normal rate, regular rhythm and normal heart sounds.   Pulmonary/Chest: Effort normal and breath sounds normal.  Abdominal: Soft. Bowel sounds are normal. There is no tenderness.  Genitourinary:  Genitourinary Comments: No genital lesions, vagina normal, yellowish non-odorous discharge that is moderately viscous. No cervicitis or cervical motion tenderness. Mild-to-moderate right sided adnexal tenderness to palpation.  Musculoskeletal: She exhibits no edema.  Neurological: She is alert and oriented to person, place, and time.  Skin: Skin is warm and dry. No rash noted. No erythema. No pallor.  Left buttocks with 2 postinflammatory hyperpigmented lesions. One lesion with a small nodule. No surrounding erythema, increased warmth, or streaking. No suppuration or bleeding  Psychiatric: She has a normal mood and affect. Her behavior is normal. Thought content normal.  Vitals reviewed.    Assessment & Plan:   1. Furuncle of buttock -Patient has used several oral and topical antibiotics. Furuncles are now postinflammatory. Patient requests referral to dermatology. - Ambulatory referral to Dermatology  2. Angioedema, subsequent encounter -No angioedema apparent in clinic today. However, she shows multiple pictures from her cell phone in which her face and eyelids are swollen. Previous ANA and ESR negative/normal. Patient takes antihistamines sporadically. I have advised that she take 1 Zyrtec daily for the next 30 days. - C4 complement - Food Allergy Panel - Allergy profile region II-[place], [place], [place], [place], [place] - POCT urine pregnancy  3. Vaginal discharge -Azithromycin and doxycycline - Cervicovaginal ancillary only - POCT urine pregnancy   Meds ordered this encounter  Medications  . azithromycin (ZITHROMAX) 500 MG  tablet    Sig: Take 1 tablet (500 mg total) by mouth once. Take two tablet at once today.    Dispense:  2 tablet    Refill:  0    Order Specific Question:   Supervising Provider    Answer:   Tresa Garter [id number]  . metroNIDAZOLE (FLAGYL) 500 MG tablet    Sig: Take 1 tablet (500 mg total) by mouth 3 (three) times daily.    Dispense:  21 tablet    Refill:  0    Order Specific Question:   Supervising Provider    Answer:   Tresa Garter [id number]  . doxycycline (VIBRA-TABS) 100 MG tablet    Sig: Take 1 tablet (100 mg total) by mouth 2 (two) times daily.    Dispense:  20 tablet    Refill:  0    Order Specific Question:   Supervising Provider    Answer:   Tresa Garter [id number]    Follow-up: Return in about 1 week (around 12/14/2016) for f/u vag discharge, labs, and furuncle of buttock.Clent Demark PA

## 2016-12-08 LAB — RESPIRATORY ALLERGY PROFILE REGION II ~~LOC~~
Allergen, A. alternata, m6: <0.1 kU/L
Allergen, C. Herbarum, M2: <0.1 kU/L
Allergen, Cedar tree, t12: <0.1 kU/L
Allergen, Comm Silver Birch, t9: <0.1 kU/L
Allergen, Cottonwood, t14: <0.1 kU/L
Allergen, D pternoyssinus,d7: <0.1 kU/L
Allergen, Mouse Urine Protein, e78: <0.1 kU/L
Allergen, Mulberry, t76: <0.1 kU/L
Allergen, Oak,t7: <0.1 kU/L
Allergen, P. notatum, m1: <0.1 kU/L
Aspergillus fumigatus, m3: <0.1 kU/L
Bermuda Grass: <0.1 kU/L
Box Elder IgE: <0.1 kU/L
Cat Dander: <0.1 kU/L
Cockroach: <0.1 kU/L
Common Ragweed: <0.1 kU/L
D. farinae: <0.1 kU/L
Dog Dander: <0.1 kU/L
Elm IgE: <0.1 kU/L
IgE (Immunoglobulin E), Serum: 21 kU/L (ref <115)
Johnson Grass: <0.1 kU/L
Pecan/Hickory Tree IgE: <0.1 kU/L
Rough Pigweed  IgE: <0.1 kU/L
Sheep Sorrel IgE: <0.1 kU/L
Timothy Grass: <0.1 kU/L

## 2016-12-08 LAB — C4 COMPLEMENT: C4 Complement: 36 mg/dL (ref 15–57)

## 2016-12-08 LAB — FOOD ALLERGY PANEL
Clams: <0.1 kU/L
Corn: <0.1 kU/L
Egg White IgE: <0.1 kU/L
Fish Cod: <0.1 kU/L
Milk IgE: <0.1 kU/L
Peanut IgE: <0.1 kU/L
Shrimp IgE: <0.1 kU/L
Soybean IgE: <0.1 kU/L
Walnut: <0.1 kU/L
Wheat IgE: <0.1 kU/L

## 2016-12-08 MED FILL — ?METRONIDAZOLE 500 MG TABLE: 500 | 7 days supply | Qty: 21 | Fill #0

## 2016-12-09 LAB — CERVICOVAGINAL ANCILLARY ONLY
Chlamydia: NEGATIVE
Neisseria Gonorrhea: NEGATIVE
Wet Prep (BD Affirm): POSITIVE — AB

## 2016-12-11 ENCOUNTER — Telehealth: Payer: Self-pay | Admitting: Family Medicine

## 2016-12-11 NOTE — Telephone Encounter (Signed)
I have spent approximately 15-20 minutes speaking to patient about her results. She says that she continues to get sporadic angioedema. Reports that she has "many more" medical records where providers have studied the possible cause of her angioedema. Says she will bring her outside records in for review.

## 2016-12-11 NOTE — Progress Notes (Signed)
I called patient and left a message. I told her to call back or schedule an appointment.

## 2016-12-11 NOTE — Telephone Encounter (Signed)
Pt. Called stating she received a call regarding her results. Please f/u

## 2016-12-14 ENCOUNTER — Other Ambulatory Visit: Payer: Medicaid - Out of State | Admitting: Family Medicine

## 2016-12-25 ENCOUNTER — Other Ambulatory Visit: Payer: Self-pay | Admitting: Pharmacist

## 2016-12-25 ENCOUNTER — Encounter: Payer: Self-pay | Admitting: Family Medicine

## 2016-12-25 ENCOUNTER — Ambulatory Visit: Payer: Medicaid Other | Attending: Family Medicine | Admitting: Family Medicine

## 2016-12-25 VITALS — BP 128/76 | HR 83 | Temp 98.7°F | Resp 18 | Ht 63.0 in | Wt 177.0 lb

## 2016-12-25 DIAGNOSIS — E119 Type 2 diabetes mellitus without complications: Secondary | ICD-10-CM | POA: Insufficient documentation

## 2016-12-25 DIAGNOSIS — L732 Hidradenitis suppurativa: Secondary | ICD-10-CM | POA: Insufficient documentation

## 2016-12-25 DIAGNOSIS — I10 Essential (primary) hypertension: Secondary | ICD-10-CM | POA: Insufficient documentation

## 2016-12-25 DIAGNOSIS — Z124 Encounter for screening for malignant neoplasm of cervix: Secondary | ICD-10-CM | POA: Insufficient documentation

## 2016-12-25 DIAGNOSIS — Z0000 Encounter for general adult medical examination without abnormal findings: Secondary | ICD-10-CM | POA: Insufficient documentation

## 2016-12-25 DIAGNOSIS — Z79899 Other long term (current) drug therapy: Secondary | ICD-10-CM | POA: Diagnosis not present

## 2016-12-25 DIAGNOSIS — R609 Edema, unspecified: Secondary | ICD-10-CM | POA: Insufficient documentation

## 2016-12-25 DIAGNOSIS — N9489 Other specified conditions associated with female genital organs and menstrual cycle: Secondary | ICD-10-CM | POA: Diagnosis not present

## 2016-12-25 DIAGNOSIS — Z1231 Encounter for screening mammogram for malignant neoplasm of breast: Secondary | ICD-10-CM

## 2016-12-25 DIAGNOSIS — Z1239 Encounter for other screening for malignant neoplasm of breast: Secondary | ICD-10-CM

## 2016-12-25 DIAGNOSIS — Z880 Allergy status to penicillin: Secondary | ICD-10-CM | POA: Diagnosis not present

## 2016-12-25 LAB — TSH: TSH: 1.28 mIU/L

## 2016-12-25 MED ORDER — EXENATIDE ER 2 MG ~~LOC~~ PEN: PEN_INJECTOR | SUBCUTANEOUS | 0 refills | Status: DC

## 2016-12-25 MED ORDER — FUROSEMIDE 20 MG PO TABS: 20.0000 mg | ORAL_TABLET | Freq: Every day | ORAL | 3 refills | Status: DC

## 2016-12-25 MED FILL — BYDUREON 2 MG PEN INJECT: 2 | 28 days supply | Qty: 4 | Fill #0

## 2016-12-25 MED FILL — FUROSEMIDE 20 MG TABLET: 20 | 30 days supply | Qty: 30 | Fill #0

## 2016-12-25 NOTE — Progress Notes (Signed)
Patient is here for PAP  Patient denies pain at this time.  Patient has taken medication today. Patient has eaten today.

## 2016-12-25 NOTE — Progress Notes (Signed)
Subjective:  Patient ID: Amber Rose, female    DOB: Oct 25, 1976  Age: 41 y.o. MRN: [id number]  CC: Gynecologic Exam   HPI Amber Rose presents For complete physical exam. She complains of facial edema which occur only during her periods and are worse on lying down but resolves when she sits up but denies shortness of breath on a daily basis, pedal edema or chest pains. She has also noticed that she breaks out in boils a lot.  Past Medical History:  Diagnosis Date  . Arthritis   . Chronic UTI   . Diabetes mellitus without complication ([place])   . Hypertension     History reviewed. No pertinent surgical history.  Allergies  Allergen Reactions  . Metformin And Related Nausea And Vomiting  . Penicillins   . Xanax [Alprazolam]       Outpatient Medications Prior to Visit  Medication Sig Dispense Refill  . acyclovir (ZOVIRAX) 400 MG tablet Take 1 tablet (400 mg total) by mouth 2 (two) times daily. 60 tablet 3  . amLODipine (NORVASC) 10 MG tablet Take 1 tablet (10 mg total) by mouth daily. 90 tablet 3  . atorvastatin (LIPITOR) 40 MG tablet Take 1 tablet (40 mg total) by mouth daily. 30 tablet 3  . diazepam (VALIUM) 5 MG tablet Take 5 mg by mouth every 6 (six) hours as needed for anxiety.    . Exenatide ER (BYDUREON BCISE) 2 MG/0.85ML AUIJ Inject 2 mg into the skin once a week. 4 pen 2  . ibuprofen (ADVIL,MOTRIN) 800 MG tablet Take 800 mg by mouth every 8 (eight) hours as needed.    Marland Kitchen lisinopril (PRINIVIL,ZESTRIL) 20 MG tablet Take 20 mg by mouth daily.    . mupirocin ointment (BACTROBAN) 2 % Apply to AA bid X 1 week 22 g 0  . chlorhexidine (HIBICLENS) 4 % external liquid Apply topically daily as needed. (Patient not taking: Reported on 10/23/2016) 120 mL 0  . doxycycline (VIBRA-TABS) 100 MG tablet Take 1 tablet (100 mg total) by mouth 2 (two) times daily. (Patient not taking: Reported on 12/25/2016) 20 tablet 0  . metroNIDAZOLE (FLAGYL) 500 MG tablet Take 1 tablet (500  mg total) by mouth 3 (three) times daily. 21 tablet 0   No facility-administered medications prior to visit.     ROS Review of Systems  Constitutional: Negative for activity change, appetite change and fatigue.  HENT: Negative for congestion, sinus pressure and sore throat.   Eyes: Negative for visual disturbance.  Respiratory: Negative for cough, chest tightness, shortness of breath and wheezing.   Cardiovascular: Negative for chest pain and palpitations.  Gastrointestinal: Negative for abdominal distention, abdominal pain and constipation.  Endocrine: Negative for polydipsia.  Genitourinary: Negative for dysuria and frequency.  Musculoskeletal: Negative for arthralgias and back pain.  Skin: Positive for rash (boils).  Neurological: Negative for tremors, light-headedness and numbness.  Hematological: Does not bruise/bleed easily.  Psychiatric/Behavioral: Negative for agitation and behavioral problems.    Objective:  BP 128/76 (BP Location: Left Arm, Patient Position: Sitting, Cuff Size: Normal)   Pulse 83   Temp 98.7 F (37.1 C) (Oral)   Resp 18   Ht 5\' 3"  (1.6 m)   Wt 177 lb (80.3 kg)   SpO2 99%   BMI 31.35 kg/m   BP/Weight 12/25/2016 12/07/2016 [id number]  Systolic BP 0000000 [id number]  Diastolic BP 76 68 66  Wt. (Lbs) 177 176.2 182.6  BMI 31.35 31.21 32.35      Physical Exam  Constitutional:  She is oriented to person, place, and time. She appears well-developed and well-nourished. No distress.  HENT:  Head: Normocephalic.  Right Ear: External ear normal.  Left Ear: External ear normal.  Nose: Nose normal.  Mouth/Throat: Oropharynx is clear and moist.  Eyes: Conjunctivae and EOM are normal. Pupils are equal, round, and reactive to light.  Neck: Normal range of motion. No JVD present.  Cardiovascular: Normal rate, regular rhythm, normal heart sounds and intact distal pulses.  Exam reveals no gallop.   No murmur heard. Pulmonary/Chest: Effort normal and breath sounds  normal. No respiratory distress. She has no wheezes. She has no rales. She exhibits no tenderness. Right breast exhibits no mass, no nipple discharge and no tenderness. Left breast exhibits no mass, no nipple discharge and no tenderness.  Abdominal: Soft. Bowel sounds are normal. She exhibits no distension and no mass. There is no tenderness.  Genitourinary:  Genitourinary Comments: Normal external genitalia, whitish discharge in vagina, normal cervix, normal adnexa, no CMT  Musculoskeletal: Normal range of motion. She exhibits no edema or tenderness.  Neurological: She is alert and oriented to person, place, and time. She has normal reflexes.  Skin: She is not diaphoretic.  Hyperpigmented mole on cheek Furuncle on medial aspect of right butt cheek in early stage, no discharge  Psychiatric: She has a normal mood and affect.     Assessment & Plan:   1. Annual physical exam   2. Screening for breast cancer - MM Digital Screening; Future  3. Screening for cervical cancer - Cytology - PAP [place]  4. Menstrual edema Trial of low-dose Lasix only to be taken during her periods for edema  - furosemide (LASIX) 20 MG tablet; Take 1 tablet (20 mg total) by mouth daily.  Dispense: 30 tablet; Refill: 3 - TSH - FSH/LH  5. Hidradenitis No acute flare at this time Advised to use bleach baths.   Meds ordered this encounter  Medications  . furosemide (LASIX) 20 MG tablet    Sig: Take 1 tablet (20 mg total) by mouth daily.    Dispense:  30 tablet    Refill:  3    Follow-up: Return in about 6 weeks (around 02/05/2017) for Follow-up on chronic medical conditions.Arnoldo Morale MD

## 2016-12-26 LAB — FSH/LH
FSH: 5.4 m[IU]/mL
LH: 10.7 m[IU]/mL

## 2016-12-29 ENCOUNTER — Telehealth: Payer: Self-pay

## 2016-12-29 LAB — CYTOLOGY - PAP
Adequacy: ABSENT
Bacterial vaginitis: NEGATIVE
Candida vaginitis: NEGATIVE
Diagnosis: NEGATIVE
HPV: NOT DETECTED

## 2016-12-29 NOTE — Telephone Encounter (Signed)
-----   Message from Arnoldo Morale, MD sent at 12/28/2016  8:22 AM EST ----- Please inform the patient that labs are normal. Thank you.

## 2016-12-29 NOTE — Telephone Encounter (Signed)
Writer called patient and discussed lab results.  Patient stated understanding. 

## 2017-01-01 ENCOUNTER — Telehealth: Payer: Self-pay

## 2017-01-01 NOTE — Telephone Encounter (Signed)
-----   Message from Arnoldo Morale, MD sent at 12/30/2016  1:15 PM EST ----- Please inform the patient that labs are normal. Thank you.

## 2017-01-01 NOTE — Telephone Encounter (Signed)
Writer called patient with lab results.  Patient stated an understanding.

## 2017-01-12 MED FILL — ?ACYCLOVIR 400 MG TABLET: 400 | 30 days supply | Qty: 60 | Fill #1

## 2017-01-15 ENCOUNTER — Ambulatory Visit: Payer: Self-pay | Attending: Family Medicine

## 2017-01-18 ENCOUNTER — Telehealth: Payer: Self-pay | Admitting: Family Medicine

## 2017-01-18 DIAGNOSIS — E0800 Diabetes mellitus due to underlying condition with hyperosmolarity without nonketotic hyperglycemic-hyperosmolar coma (NKHHC): Secondary | ICD-10-CM

## 2017-01-18 MED ORDER — LISINOPRIL 20 MG PO TABS: 20.0000 mg | ORAL_TABLET | Freq: Every day | ORAL | 2 refills | Status: DC

## 2017-01-18 MED FILL — ?LISINOPRIL 20 MG TABLET: 20 | 30 days supply | Qty: 30 | Fill #0

## 2017-01-18 NOTE — Telephone Encounter (Signed)
Pt. Called requesting a refill on Lisinopril. Pt. Would like her Rx sent to [facility] pharmacy. Pt. States she is out of medication.  Please f/u

## 2017-01-18 NOTE — Telephone Encounter (Signed)
Lisinopril refilled.

## 2017-01-21 ENCOUNTER — Other Ambulatory Visit: Payer: Self-pay | Admitting: Family Medicine

## 2017-01-25 ENCOUNTER — Other Ambulatory Visit: Payer: Self-pay

## 2017-01-25 MED ORDER — EXENATIDE ER 2 MG ~~LOC~~ PEN: PEN_INJECTOR | SUBCUTANEOUS | 3 refills | Status: DC

## 2017-01-25 MED FILL — BYDUREON 2 MG PEN INJECT: 2 | 28 days supply | Qty: 4 | Fill #0

## 2017-02-02 ENCOUNTER — Encounter: Payer: Self-pay | Admitting: Family Medicine

## 2017-02-02 ENCOUNTER — Ambulatory Visit: Payer: Medicaid Other | Attending: Family Medicine | Admitting: Family Medicine

## 2017-02-02 VITALS — BP 122/78 | HR 76 | Temp 98.6°F | Ht 63.5 in | Wt 176.8 lb

## 2017-02-02 DIAGNOSIS — M79652 Pain in left thigh: Secondary | ICD-10-CM | POA: Diagnosis not present

## 2017-02-02 DIAGNOSIS — Z720 Tobacco use: Secondary | ICD-10-CM | POA: Diagnosis not present

## 2017-02-02 DIAGNOSIS — R232 Flushing: Secondary | ICD-10-CM | POA: Insufficient documentation

## 2017-02-02 DIAGNOSIS — M79651 Pain in right thigh: Secondary | ICD-10-CM | POA: Insufficient documentation

## 2017-02-02 DIAGNOSIS — F329 Major depressive disorder, single episode, unspecified: Secondary | ICD-10-CM | POA: Insufficient documentation

## 2017-02-02 DIAGNOSIS — M1990 Unspecified osteoarthritis, unspecified site: Secondary | ICD-10-CM | POA: Insufficient documentation

## 2017-02-02 DIAGNOSIS — E1100 Type 2 diabetes mellitus with hyperosmolarity without nonketotic hyperglycemic-hyperosmolar coma (NKHHC): Secondary | ICD-10-CM | POA: Insufficient documentation

## 2017-02-02 DIAGNOSIS — Z87440 Personal history of urinary (tract) infections: Secondary | ICD-10-CM | POA: Diagnosis not present

## 2017-02-02 DIAGNOSIS — Z880 Allergy status to penicillin: Secondary | ICD-10-CM | POA: Diagnosis not present

## 2017-02-02 DIAGNOSIS — Z888 Allergy status to other drugs, medicaments and biological substances status: Secondary | ICD-10-CM | POA: Insufficient documentation

## 2017-02-02 DIAGNOSIS — F419 Anxiety disorder, unspecified: Secondary | ICD-10-CM | POA: Insufficient documentation

## 2017-02-02 DIAGNOSIS — L732 Hidradenitis suppurativa: Secondary | ICD-10-CM | POA: Insufficient documentation

## 2017-02-02 DIAGNOSIS — F17200 Nicotine dependence, unspecified, uncomplicated: Secondary | ICD-10-CM | POA: Insufficient documentation

## 2017-02-02 DIAGNOSIS — F32A Anxiety disorder, unspecified: Secondary | ICD-10-CM

## 2017-02-02 DIAGNOSIS — M79659 Pain in unspecified thigh: Secondary | ICD-10-CM | POA: Insufficient documentation

## 2017-02-02 DIAGNOSIS — I10 Essential (primary) hypertension: Secondary | ICD-10-CM | POA: Diagnosis not present

## 2017-02-02 DIAGNOSIS — F418 Other specified anxiety disorders: Secondary | ICD-10-CM

## 2017-02-02 LAB — GLUCOSE, POCT (MANUAL RESULT ENTRY): POC Glucose: 86 mg/dl (ref 70–99)

## 2017-02-02 LAB — POCT GLYCOSYLATED HEMOGLOBIN (HGB A1C): Hemoglobin A1C: 5.8

## 2017-02-02 MED ORDER — METHOCARBAMOL 750 MG PO TABS: 750.0000 mg | ORAL_TABLET | Freq: Four times a day (QID) | ORAL | 2 refills | Status: DC

## 2017-02-02 MED ORDER — ACYCLOVIR 400 MG PO TABS: 400.0000 mg | ORAL_TABLET | Freq: Two times a day (BID) | ORAL | 3 refills | Status: DC

## 2017-02-02 MED ORDER — TRAZODONE HCL 50 MG PO TABS: 25.0000 mg | ORAL_TABLET | Freq: Every evening | ORAL | 3 refills | Status: DC | PRN

## 2017-02-02 MED ORDER — MELOXICAM 7.5 MG PO TABS: 7.5000 mg | ORAL_TABLET | Freq: Every day | ORAL | 0 refills | Status: DC

## 2017-02-02 MED ORDER — TRAZODONE HCL 100 MG PO TABS: 100.0000 mg | ORAL_TABLET | Freq: Every evening | ORAL | 3 refills | Status: DC | PRN

## 2017-02-02 MED ORDER — SERTRALINE HCL 50 MG PO TABS: 50.0000 mg | ORAL_TABLET | Freq: Every day | ORAL | 3 refills | Status: DC

## 2017-02-02 MED FILL — MELOXICAM 7.5 MG TABLET: 7.5 | 30 days supply | Qty: 30 | Fill #0

## 2017-02-02 MED FILL — SERTRALINE HCL 50 MG TABLET: 50 | 30 days supply | Qty: 30 | Fill #0

## 2017-02-02 MED FILL — METHOCARBAMOL 750 MG TABLET: 750 | 15 days supply | Qty: 60 | Fill #0

## 2017-02-02 MED FILL — AMLODIPINE BESYLATE 10 MG T: 10 | 30 days supply | Qty: 30 | Fill #1

## 2017-02-02 MED FILL — traZODone HCL 50 MG TABS: 50 | 30 days supply | Qty: 30 | Fill #0

## 2017-02-02 NOTE — Progress Notes (Signed)
Subjective:  Patient ID: Amber Rose, female    DOB: 12-May-1976  Age: 41 y.o. MRN: [id number]  CC: Leg Pain (bilateral, bruising, right worse then left); Hypertension; Angioedema; and Diabetes   HPI Amber Rose is a 41 year old female with type 2 diabetes mellitus (A1c 5.8), hypertension, depression and anxiety, hydradenitis, tobacco abuse who presents for a follow-up visit.  She complains of chronic facial edema and flushing which occur only during her periods and are worse on lying down but resolves when she sits up but denies shortness of breath on a daily basis, pedal edema or chest pains. Autoimmune labs so far have been negative; workup from her previous PCP in [place] has been unrevealing. She has pictures to this effect with associated periorbital edema.   Also complains of bilateral thigh pain, right worse than left which is worse at night and feels like an ache. She was previously given diazepam while in [place] for this symptoms. Has also noticed bruises on her thighs with no history of trauma. Symptoms are worse on days when she has been more active and has walked a lot.  She is also frustrated about the recurrent boils which are more frequent in her perineum; she has been using chlorhexidine. Denies fevers.  Currently sees a psychologist for counseling but has been out of her antidepressant which she would like a refill-trazodone and Zoloft; states she did not do well on Cymbalta in the past.  Past Medical History:  Diagnosis Date  . Arthritis   . Chronic UTI   . Diabetes mellitus without complication ([place])   . Hypertension     History reviewed. No pertinent surgical history.  Allergies  Allergen Reactions  . Metformin And Related Nausea And Vomiting  . Penicillins   . Xanax [Alprazolam]      Outpatient Medications Prior to Visit  Medication Sig Dispense Refill  . amLODipine (NORVASC) 10 MG tablet Take 1 tablet (10 mg total) by mouth daily. 90 tablet  3  . chlorhexidine (HIBICLENS) 4 % external liquid Apply topically daily as needed. 120 mL 0  . diazepam (VALIUM) 5 MG tablet Take 5 mg by mouth every 6 (six) hours as needed for anxiety.    . Exenatide ER (BYDUREON) 2 MG PEN INJECT 2 MG INTO THE SKIN ONCE A WEEK. 12 each 3  . lisinopril (PRINIVIL,ZESTRIL) 20 MG tablet Take 1 tablet (20 mg total) by mouth daily. 30 tablet 2  . mupirocin ointment (BACTROBAN) 2 % Apply to AA bid X 1 week 22 g 0  . acyclovir (ZOVIRAX) 400 MG tablet Take 1 tablet (400 mg total) by mouth 2 (two) times daily. 60 tablet 3  . ibuprofen (ADVIL,MOTRIN) 800 MG tablet Take 800 mg by mouth every 8 (eight) hours as needed.    Marland Kitchen atorvastatin (LIPITOR) 40 MG tablet Take 1 tablet (40 mg total) by mouth daily. (Patient not taking: Reported on 02/02/2017) 30 tablet 3  . furosemide (LASIX) 20 MG tablet Take 1 tablet (20 mg total) by mouth daily. (Patient not taking: Reported on 02/02/2017) 30 tablet 3   No facility-administered medications prior to visit.     ROS Review of Systems Constitutional: Negative for activity change, appetite change and fatigue.  HENT: Negative for congestion, sinus pressure and sore throat.   Eyes: Negative for visual disturbance.  Respiratory: Negative for cough, chest tightness, shortness of breath and wheezing.   Cardiovascular: Negative for chest pain and palpitations.  Gastrointestinal: Negative for abdominal distention, abdominal pain and constipation.  Endocrine: Negative for polydipsia.  Genitourinary: Negative for dysuria and frequency.  Musculoskeletal: See history of present illness  Skin: Positive for rash (boils).  Neurological: Negative for tremors, light-headedness and numbness.  Hematological: See history of present illness.  Psychiatric/Behavioral: Negative for agitation and behavioral problems.   Objective:  BP 122/78 (BP Location: Right Arm, Patient Position: Sitting, Cuff Size: Small)   Pulse 76   Temp 98.6 F (37 C) (Oral)    Ht 5' 3.5" (1.613 m)   Wt 176 lb 12.8 oz (80.2 kg)   SpO2 98%   BMI 30.83 kg/m   BP/Weight 02/02/2017 12/25/2016 2/80/0349  Systolic BP 179 150 569  Diastolic BP 78 76 68  Wt. (Lbs) 176.8 177 176.2  BMI 30.83 31.35 31.21      Physical Exam Constitutional: She is oriented to person, place, and time. She appears well-developed and well-nourished. No distress.  HENT:  Head: Normocephalic.  Right Ear: External ear normal.  Left Ear: External ear normal.  Nose: Nose normal.  Mouth/Throat: Oropharynx is clear and moist.  Eyes: Conjunctivae and EOM are normal. Pupils are equal, round, and reactive to light.  Neck: Normal range of motion. No JVD present.  Cardiovascular: Normal rate, regular rhythm, normal heart sounds and intact distal pulses.  Exam reveals no gallop.   No murmur heard. Pulmonary/Chest: Effort normal and breath sounds normal. No respiratory distress. She has no wheezes. She has no rales. She exhibits no tenderness. Right breast exhibits no mass, no nipple discharge and no tenderness. Left breast exhibits no mass, no nipple discharge and no tenderness.  Abdominal: Soft. Bowel sounds are normal. She exhibits no distension and no mass. There is no tenderness.  Musculoskeletal: Normal range of motion. She exhibits no edema or tenderness.  Neurological: She is alert and oriented to person, place, and time. She has normal reflexes.  Skin: She is not diaphoretic.  Hyperpigmented mole on cheek Psychiatric: She has a normal mood and affect.    Lab Results  Component Value Date   HGBA1C 5.8 02/02/2017     Assessment & Plan:   1. Type 2 diabetes mellitus with hyperosmolarity without coma, without long-term current use of insulin (HCC) Controlled with A1c of 5.8 Continue Bydureon - HgB A1c - Glucose (CBG)  2. Hidradenitis Recurrent boils more in perineum Use chlorhexidine as needed  3. Essential hypertension Controlled Continue antihypertensives  4. Pain in both  thighs Labs negative for autoimmune process, unexplainable bruising We'll place on muscle relaxant - meloxicam (MOBIC) 7.5 MG tablet; Take 1 tablet (7.5 mg total) by mouth daily.  Dispense: 30 tablet; Refill: 0 - methocarbamol (ROBAXIN-750) 750 MG tablet; Take 1 tablet (750 mg total) by mouth 4 (four) times daily.  Dispense: 60 tablet; Refill: 2  5. Anxiety and depression Currently undergoing Psychotherapy - sertraline (ZOLOFT) 50 MG tablet; Take 1 tablet (50 mg total) by mouth daily.  Dispense: 30 tablet; Refill: 3 - traZODone (DESYREL) 100 MG tablet; Take 1 tablet (100 mg total) by mouth at bedtime as needed for sleep.  Dispense: 30 tablet; Refill: 3  6. Tobacco abuse Need to r/o pancoast tumor given facial edema and flushing - DG Chest 2 View; Future  7. Facial flushing Will need to rule out SVC syndrome Referred for CXR Will consider carotid dopplers   Meds ordered this encounter  Medications  . DISCONTD: traZODone (DESYREL) 50 MG tablet    Sig: Take 0.5-1 tablets (25-50 mg total) by mouth at bedtime as needed for sleep.  Dispense:  30 tablet    Refill:  3  . methocarbamol (ROBAXIN-750) 750 MG tablet    Sig: Take 1 tablet (750 mg total) by mouth 4 (four) times daily.    Dispense:  60 tablet    Refill:  2  . sertraline (ZOLOFT) 50 MG tablet    Sig: Take 1 tablet (50 mg total) by mouth daily.    Dispense:  30 tablet    Refill:  3  . acyclovir (ZOVIRAX) 400 MG tablet    Sig: Take 1 tablet (400 mg total) by mouth 2 (two) times daily.    Dispense:  60 tablet    Refill:  3  . meloxicam (MOBIC) 7.5 MG tablet    Sig: Take 1 tablet (7.5 mg total) by mouth daily.    Dispense:  30 tablet    Refill:  0  . traZODone (DESYREL) 100 MG tablet    Sig: Take 1 tablet (100 mg total) by mouth at bedtime as needed for sleep.    Dispense:  30 tablet    Refill:  3    Discontinue previous dose    Follow-up: Return in about 1 month (around 03/04/2017) for Follow-up on chronic medical  conditions.Arnoldo Morale MD

## 2017-02-03 MED FILL — ACYCLOVIR 400 MG TABLET: 400 | 30 days supply | Qty: 60 | Fill #0

## 2017-02-24 ENCOUNTER — Ambulatory Visit (HOSPITAL_COMMUNITY)
Admission: RE | Admit: 2017-02-24 | Discharge: 2017-02-24 | Disposition: A | Discharge status: Home / Self Care | Payer: Medicaid Other | Source: Ambulatory Visit | Attending: Family Medicine | Admitting: Family Medicine

## 2017-02-24 ENCOUNTER — Telehealth: Payer: Self-pay

## 2017-02-24 DIAGNOSIS — R600 Localized edema: Secondary | ICD-10-CM | POA: Insufficient documentation

## 2017-02-24 DIAGNOSIS — Z720 Tobacco use: Secondary | ICD-10-CM | POA: Diagnosis not present

## 2017-02-24 NOTE — Telephone Encounter (Signed)
Clld pt - advsd of chest x-ray results. Pt stated she understood.

## 2017-02-24 NOTE — Telephone Encounter (Signed)
-----   Message from Arnoldo Morale, MD sent at 02/24/2017  4:38 PM EDT ----- Chest x-rays normal

## 2017-02-25 ENCOUNTER — Telehealth: Payer: Self-pay | Admitting: Family Medicine

## 2017-02-25 NOTE — Telephone Encounter (Signed)
Do you advise the patient schedule an appointment to discuss the needs of disability paperwork and specifics?

## 2017-02-25 NOTE — Telephone Encounter (Signed)
Patient called the office asking to speak with nurse in regards to documentation and questions  that she needs and has for  PCP to fill out for her disability. Please follow up.  Thank you.

## 2017-02-26 ENCOUNTER — Encounter: Payer: Self-pay | Admitting: Family Medicine

## 2017-02-26 ENCOUNTER — Ambulatory Visit: Payer: Medicaid Other | Attending: Family Medicine | Admitting: Family Medicine

## 2017-02-26 ENCOUNTER — Other Ambulatory Visit: Payer: Self-pay | Admitting: Family Medicine

## 2017-02-26 VITALS — BP 118/76 | HR 72 | Temp 98.6°F | Resp 16 | Ht 63.5 in | Wt 182.2 lb

## 2017-02-26 DIAGNOSIS — F329 Major depressive disorder, single episode, unspecified: Secondary | ICD-10-CM | POA: Diagnosis not present

## 2017-02-26 DIAGNOSIS — Z888 Allergy status to other drugs, medicaments and biological substances status: Secondary | ICD-10-CM | POA: Diagnosis not present

## 2017-02-26 DIAGNOSIS — F419 Anxiety disorder, unspecified: Secondary | ICD-10-CM | POA: Diagnosis not present

## 2017-02-26 DIAGNOSIS — Z880 Allergy status to penicillin: Secondary | ICD-10-CM | POA: Insufficient documentation

## 2017-02-26 DIAGNOSIS — R32 Unspecified urinary incontinence: Secondary | ICD-10-CM | POA: Diagnosis not present

## 2017-02-26 DIAGNOSIS — M1990 Unspecified osteoarthritis, unspecified site: Secondary | ICD-10-CM | POA: Diagnosis not present

## 2017-02-26 DIAGNOSIS — I10 Essential (primary) hypertension: Secondary | ICD-10-CM | POA: Insufficient documentation

## 2017-02-26 DIAGNOSIS — R232 Flushing: Secondary | ICD-10-CM | POA: Insufficient documentation

## 2017-02-26 DIAGNOSIS — Z87440 Personal history of urinary (tract) infections: Secondary | ICD-10-CM | POA: Diagnosis not present

## 2017-02-26 DIAGNOSIS — M79651 Pain in right thigh: Secondary | ICD-10-CM | POA: Diagnosis not present

## 2017-02-26 DIAGNOSIS — R278 Other lack of coordination: Secondary | ICD-10-CM | POA: Diagnosis not present

## 2017-02-26 DIAGNOSIS — E119 Type 2 diabetes mellitus without complications: Secondary | ICD-10-CM | POA: Diagnosis not present

## 2017-02-26 DIAGNOSIS — R760 Raised antibody titer: Secondary | ICD-10-CM

## 2017-02-26 MED ORDER — EXENATIDE ER 2 MG ~~LOC~~ PEN: PEN_INJECTOR | SUBCUTANEOUS | 3 refills | Status: DC

## 2017-02-26 MED ORDER — ACETAMINOPHEN-CODEINE #3 300-30 MG PO TABS: 1.0000 | ORAL_TABLET | Freq: Two times a day (BID) | ORAL | 0 refills | Status: DC | PRN

## 2017-02-26 MED FILL — ?LISINOPRIL 20 MG TABLET: 20 | 30 days supply | Qty: 30 | Fill #1

## 2017-02-26 NOTE — Telephone Encounter (Signed)
Patient verified DOB Patient was informed of an appointment being needed to discuss disability needs. Patient transferred to front desk scheduler.

## 2017-02-26 NOTE — Telephone Encounter (Signed)
Yes

## 2017-02-26 NOTE — Progress Notes (Signed)
Subjective:  Patient ID: Amber Rose, female    DOB: 15-Dec-1975  Age: 41 y.o. MRN: [id number]  CC: Hip Pain (Right - radiating down leg to top of foot x 2 wks)   HPI Amber Rose is a 41 year old female with type 2 diabetes mellitus (A1c 5.8), hypertension, depression and anxiety, hydradenitis, tobacco abuse who presents for An acute visit.  She complains of chronic facial edema and flushing which occur only during her periods and are worse on lying down but resolves when she sits up but denies shortness of breath on a daily basis, pedal edema or chest pains. Autoimmune labs so far have been negative; workup from previous physicians revealed elevated lupus anticoagulant but other hypercoagulable labs were negative. She has pictures to this effect with associated periorbital edema. She complains of coordination problems and sometimes feels her hands cramp up on her at other times she has back pain and sometimes visual disturbances. She does have a history of urinary incontinence status post transurethral intradetrusor botox  injections and trans-obturator sling.   Also complains of right thigh pain described as throbbing which radiates from her hip down to her toe and is worse when she sleeps. She previously complained of bilateral thigh pain, right worse than left which is worse at night and feels like an ache. She was previously given diazepam while in [place] for this symptoms. Has also noticed bruises on her thighs with no history of trauma. Symptoms are worse on days when she has been more active and has walked a lot.  Past Medical History:  Diagnosis Date  . Arthritis   . Chronic UTI   . Diabetes mellitus without complication ([place])   . Hypertension     No past surgical history on file.  Allergies  Allergen Reactions  . Metformin And Related Nausea And Vomiting  . Penicillins   . Xanax [Alprazolam]   . Glyburide Other (See Comments)    "blood sugar dropped  uncontrollably"  . Linagliptin Diarrhea and Other (See Comments)    Stomach pain, sinus infection  . Moxifloxacin Diarrhea     Outpatient Medications Prior to Visit  Medication Sig Dispense Refill  . acyclovir (ZOVIRAX) 400 MG tablet Take 1 tablet (400 mg total) by mouth 2 (two) times daily. 60 tablet 3  . amLODipine (NORVASC) 10 MG tablet Take 1 tablet (10 mg total) by mouth daily. 90 tablet 3  . chlorhexidine (HIBICLENS) 4 % external liquid Apply topically daily as needed. 120 mL 0  . diazepam (VALIUM) 5 MG tablet Take 5 mg by mouth every 6 (six) hours as needed for anxiety.    Marland Kitchen lisinopril (PRINIVIL,ZESTRIL) 20 MG tablet Take 1 tablet (20 mg total) by mouth daily. 30 tablet 2  . meloxicam (MOBIC) 7.5 MG tablet Take 1 tablet (7.5 mg total) by mouth daily. 30 tablet 0  . methocarbamol (ROBAXIN-750) 750 MG tablet Take 1 tablet (750 mg total) by mouth 4 (four) times daily. 60 tablet 2  . mupirocin ointment (BACTROBAN) 2 % Apply to AA bid X 1 week 22 g 0  . sertraline (ZOLOFT) 50 MG tablet Take 1 tablet (50 mg total) by mouth daily. 30 tablet 3  . traZODone (DESYREL) 100 MG tablet Take 1 tablet (100 mg total) by mouth at bedtime as needed for sleep. 30 tablet 3  . Exenatide ER (BYDUREON) 2 MG PEN INJECT 2 MG INTO THE SKIN ONCE A WEEK. 12 each 3  . atorvastatin (LIPITOR) 40 MG tablet Take 1 tablet (  40 mg total) by mouth daily. (Patient not taking: Reported on 02/02/2017) 30 tablet 3  . furosemide (LASIX) 20 MG tablet Take 1 tablet (20 mg total) by mouth daily. (Patient not taking: Reported on 02/02/2017) 30 tablet 3   No facility-administered medications prior to visit.     ROS Review of Systems Constitutional: Negative for activity change, appetite change and fatigue.  HENT: Negative for congestion, sinus pressure and sore throat.   Eyes: Negative for visual disturbance.  Respiratory: Negative for cough, chest tightness, shortness of breath and wheezing.   Cardiovascular: Negative for  chest pain and palpitations.  Gastrointestinal: Negative for abdominal distention, abdominal pain and constipation.  Endocrine: Negative for polydipsia.  Genitourinary: Negative for dysuria and frequency.  Musculoskeletal: See history of present illness  Skin: Positive for rash (boils).  Neurological: Negative for tremors, light-headedness and numbness.  Hematological: See history of present illness.  Psychiatric/Behavioral: Negative for agitation and behavioral problems.   Objective:  BP 118/76 (BP Location: Left Arm, Patient Position: Sitting, Cuff Size: Large)   Pulse 72   Temp 98.6 F (37 C) (Oral)   Resp 16   Ht 5' 3.5" (1.613 m)   Wt 182 lb 3.2 oz (82.6 kg)   LMP 02/03/2017 (Exact Date)   SpO2 98%   BMI 31.77 kg/m   BP/Weight 02/26/2017 3/38/2505 01/02/7672  Systolic BP 419 379 024  Diastolic BP 76 78 76  Wt. (Lbs) 182.2 176.8 177  BMI 31.77 30.83 31.35      Physical Exam  Constitutional: She is oriented to person, place, and time. She appears well-developed and well-nourished.  Cardiovascular: Normal rate, normal heart sounds and intact distal pulses.   No murmur heard. Pulmonary/Chest: Effort normal and breath sounds normal. She has no wheezes. She has no rales. She exhibits no tenderness.  Abdominal: Soft. Bowel sounds are normal. She exhibits no distension and no mass. There is no tenderness.  Musculoskeletal: Normal range of motion.  Neurological: She is alert and oriented to person, place, and time.  Skin: Skin is warm and dry.  Psychiatric: She has a normal mood and affect.    Lab Results  Component Value Date   HGBA1C 5.8 02/02/2017    Assessment & Plan:   1. Pain of right thigh Given coordination problems, urinary incontinence we will need to exclude multiple sclerosis with MRI - acetaminophen-codeine (TYLENOL #3) 300-30 MG tablet; Take 1 tablet by mouth every 12 (twelve) hours as needed for moderate pain.  Dispense: 60 tablet; Refill: 0 - MR Lumbar  Spine Wo Contrast; Future - MR Thoracic Spine Wo Contrast; Future  2. Facial flushing Chest x-ray unrevealing - US Carotid Duplex Bilateral; Future  3. Coordination impairment - MR Cervical Spine Wo Contrast; Future - MR Lumbar Spine Wo Contrast; Future - MR Thoracic Spine Wo Contrast; Future  4. Diabetes mellitus without complication ([place]) Controlled with A1c of 5.8 - Exenatide ER (BYDUREON) 2 MG PEN; INJECT 2 MG INTO THE SKIN ONCE A WEEK.  Dispense: 12 each; Refill: 3   Meds ordered this encounter  Medications  . Exenatide ER (BYDUREON) 2 MG PEN    Sig: INJECT 2 MG INTO THE SKIN ONCE A WEEK.    Dispense:  12 each    Refill:  3  . acetaminophen-codeine (TYLENOL #3) 300-30 MG tablet    Sig: Take 1 tablet by mouth every 12 (twelve) hours as needed for moderate pain.    Dispense:  60 tablet    Refill:  0  Follow-up: Return in about 1 month (around 03/29/2017) for Follow-up of chronic medical conditions.   Arnoldo Morale MD

## 2017-03-01 MED FILL — ACETAMINOPHEN/COD #3 TABLET: 300-30 | 30 days supply | Qty: 60 | Fill #0

## 2017-03-02 ENCOUNTER — Other Ambulatory Visit: Payer: Self-pay | Admitting: Family Medicine

## 2017-03-02 DIAGNOSIS — F329 Major depressive disorder, single episode, unspecified: Secondary | ICD-10-CM

## 2017-03-02 DIAGNOSIS — F32A Anxiety disorder, unspecified: Secondary | ICD-10-CM

## 2017-03-02 DIAGNOSIS — F419 Anxiety disorder, unspecified: Secondary | ICD-10-CM

## 2017-03-02 MED FILL — BYDUREON 2 MG PEN INJECT: 2 | 7 days supply | Qty: 1 | Fill #0

## 2017-03-03 MED FILL — ?MELOXICAM 7.5 MG TABLET: 7.5 | 30 days supply | Qty: 30 | Fill #0

## 2017-03-05 ENCOUNTER — Ambulatory Visit (HOSPITAL_COMMUNITY)
Admission: RE | Admit: 2017-03-05 | Discharge: 2017-03-05 | Disposition: A | Discharge status: Home / Self Care | Payer: Medicaid Other | Source: Ambulatory Visit | Attending: Family Medicine | Admitting: Family Medicine

## 2017-03-05 ENCOUNTER — Ambulatory Visit (HOSPITAL_BASED_OUTPATIENT_CLINIC_OR_DEPARTMENT_OTHER)
Admission: RE | Admit: 2017-03-05 | Discharge: 2017-03-05 | Disposition: A | Discharge status: Home / Self Care | Payer: Medicaid Other | Source: Ambulatory Visit | Attending: Family Medicine | Admitting: Family Medicine

## 2017-03-05 ENCOUNTER — Telehealth: Payer: Self-pay | Admitting: *Deleted

## 2017-03-05 ENCOUNTER — Ambulatory Visit (HOSPITAL_COMMUNITY): Payer: Medicaid Other

## 2017-03-05 ENCOUNTER — Encounter (INDEPENDENT_AMBULATORY_CARE_PROVIDER_SITE_OTHER): Payer: Self-pay

## 2017-03-05 DIAGNOSIS — R232 Flushing: Secondary | ICD-10-CM | POA: Insufficient documentation

## 2017-03-05 DIAGNOSIS — M47894 Other spondylosis, thoracic region: Secondary | ICD-10-CM | POA: Diagnosis not present

## 2017-03-05 DIAGNOSIS — I6523 Occlusion and stenosis of bilateral carotid arteries: Secondary | ICD-10-CM | POA: Diagnosis not present

## 2017-03-05 DIAGNOSIS — R278 Other lack of coordination: Secondary | ICD-10-CM | POA: Insufficient documentation

## 2017-03-05 DIAGNOSIS — M47896 Other spondylosis, lumbar region: Secondary | ICD-10-CM | POA: Diagnosis not present

## 2017-03-05 DIAGNOSIS — M79651 Pain in right thigh: Secondary | ICD-10-CM | POA: Insufficient documentation

## 2017-03-05 DIAGNOSIS — M47892 Other spondylosis, cervical region: Secondary | ICD-10-CM | POA: Diagnosis not present

## 2017-03-05 LAB — VAS US CAROTID
LEFT ECA DIAS: -11 cm/s
LEFT VERTEBRAL DIAS: -13 cm/s
Left CCA dist dias: -21 cm/s
Left CCA dist sys: -68 cm/s
Left CCA prox dias: 16 cm/s
Left CCA prox sys: 98 cm/s
Left ICA dist dias: -30 cm/s
Left ICA dist sys: -77 cm/s
Left ICA prox dias: -22 cm/s
Left ICA prox sys: -70 cm/s
RIGHT ECA DIAS: -8 cm/s
RIGHT VERTEBRAL DIAS: -14 cm/s
Right CCA prox dias: 6 cm/s
Right CCA prox sys: 87 cm/s
Right cca dist sys: -75 cm/s

## 2017-03-05 MED FILL — BYDUREON 2 MG PEN INJECT: 2 | 7 days supply | Qty: 1 | Fill #1

## 2017-03-05 NOTE — Progress Notes (Signed)
VASCULAR LAB PRELIMINARY  PRELIMINARY  PRELIMINARY  PRELIMINARY  Carotid duplex completed.    Preliminary report:  Bilateral:  1-39% ICA stenosis.  Vertebral artery flow is antegrade.     Sharis Keeran, RVS 03/05/2017, 9:49 AM

## 2017-03-05 NOTE — Telephone Encounter (Signed)
Pt arrived to [facility], alert and oriented. Writer noticed 2 hospital identification band on her wrist. Pt states she just left hospital after having a MRI this morning. She wanted to get results. Pt informed that Dr. Brita Romp has not had a chance to look at the results that resulted at 1324. She would review the impression of MRI and give her a call.   She adds she has a new bruise to her left leg.  Noticed that this bruise appeared after working 2 days ago. She c/o left leg weakness and numbeness, leg "locks up" while walking. States she stumbles d/t this and may loose balance but denies falls.  Ecchymosis assessed to left lateral thigh.  She states she has Incontinence episodes and back pain which is constant and not acute She has generalized pain. Pain increases at night, 10/10. Pain medication taken which takes the edge off. She is taking Tylenol #3  VS: 142/92  P: 75  SpO2: 99%  R:16   Pt. Phone number: [phone]  Dr. Jarold Song notified of patient office visit.

## 2017-03-05 NOTE — Telephone Encounter (Signed)
Noted  

## 2017-03-10 ENCOUNTER — Encounter: Payer: Self-pay | Admitting: Family Medicine

## 2017-03-10 ENCOUNTER — Ambulatory Visit: Payer: Medicaid Other | Attending: Family Medicine | Admitting: Family Medicine

## 2017-03-10 VITALS — BP 121/79 | HR 87 | Temp 97.7°F | Wt 180.8 lb

## 2017-03-10 DIAGNOSIS — E119 Type 2 diabetes mellitus without complications: Secondary | ICD-10-CM | POA: Diagnosis present

## 2017-03-10 DIAGNOSIS — F329 Major depressive disorder, single episode, unspecified: Secondary | ICD-10-CM | POA: Insufficient documentation

## 2017-03-10 DIAGNOSIS — M79651 Pain in right thigh: Secondary | ICD-10-CM | POA: Diagnosis not present

## 2017-03-10 DIAGNOSIS — F419 Anxiety disorder, unspecified: Secondary | ICD-10-CM | POA: Diagnosis not present

## 2017-03-10 DIAGNOSIS — I10 Essential (primary) hypertension: Secondary | ICD-10-CM | POA: Diagnosis not present

## 2017-03-10 DIAGNOSIS — R232 Flushing: Secondary | ICD-10-CM

## 2017-03-10 DIAGNOSIS — F32A Anxiety disorder, unspecified: Secondary | ICD-10-CM

## 2017-03-10 LAB — GLUCOSE, POCT (MANUAL RESULT ENTRY): POC Glucose: 119 mg/dl — AB (ref 70–99)

## 2017-03-11 NOTE — Progress Notes (Signed)
Subjective:    Patient ID: Amber Rose, female    DOB: Sep 23, 1976, 41 y.o.   MRN: [id number]  HPI She is a 41 year old female with type 2 diabetes mellitus (A1c 5.8), hypertension, depression and anxiety, hydradenitis, tobacco abuse who presents To follow-up on MRI and carotid Doppler which were performed as a result of complaints of thigh pains and facial flushing and swelling. MRI revealed degenerative changes but no evidence of demyelinating disease , carotid Doppler was negative for stenosis.  She complains of chronic facial edema and flushing which occur only during her periods and are worse on lying down but resolves when she sits up but denies shortness of breath on a daily basis, pedal edema or chest pains. Autoimmune labs so far have been negative; workup from previous physicians revealed elevated lupus anticoagulant but other hypercoagulable labs were negative. She has pictures to this effect with associated periorbital edema. She complains of coordination problems and sometimes feels her hands cramp up on her at other times she has back pain and sometimes visual disturbances. She does have a history of urinary incontinence status post transurethral intradetrusor botox  injections and trans-obturator sling.   Also complains of right thigh pain described as throbbing which radiates from her hip down to her toe and is worse when she sleeps. She previously complained of bilateral thigh pain, right worse than left which is worse at night and feels like an ache. She was previously given diazepam while in [place] for this symptoms. Has also noticed bruises on her thighs with no history of trauma. Symptoms are worse on days when she has been more active and has walked a lot. Use of Tylenol No. 3 haven't helped her symptoms. She is requesting a letter stating her medical conditions and how this affects her job as she walks a lot at the the [facility] but has to work 3 days every other week -  she is trying to apply for disability.  Past Medical History:  Diagnosis Date  . Arthritis   . Chronic UTI   . Diabetes mellitus without complication ([place])   . Hypertension     No past surgical history on file.  Allergies  Allergen Reactions  . Metformin And Related Nausea And Vomiting  . Penicillins   . Xanax [Alprazolam]   . Glyburide Other (See Comments)    "blood sugar dropped uncontrollably"  . Linagliptin Diarrhea and Other (See Comments)    Stomach pain, sinus infection  . Moxifloxacin Diarrhea       Review of Systems Constitutional: Negative for activity change, appetite change and fatigue.  HENT: Negative for congestion, sinus pressure and sore throat.   Eyes: Negative for visual disturbance.  Respiratory: Negative for cough, chest tightness, shortness of breath and wheezing.   Cardiovascular: Negative for chest pain and palpitations.  Gastrointestinal: Negative for abdominal distention, abdominal pain and constipation.  Endocrine: Negative for polydipsia.  Genitourinary: Negative for dysuria and frequency.  Musculoskeletal: See history of present illness  Skin: Negative  Neurological: Negative for tremors, light-headedness and numbness.  Hematological: See history of present illness.  Psychiatric/Behavioral: Negative for agitation and behavioral problems.     Objective: Vitals:   03/10/17 1545  BP: 121/79  Pulse: 87  Temp: 97.7 F (36.5 C)  TempSrc: Oral  SpO2: 98%  Weight: 180 lb 12.8 oz (82 kg)      Physical Exam Constitutional: She is oriented to person, place, and time. She appears well-developed and well-nourished.  Cardiovascular: Normal rate, normal  heart sounds and intact distal pulses.   No murmur heard. Pulmonary/Chest: Effort normal and breath sounds normal. She has no wheezes. She has no rales. She exhibits no tenderness.  Abdominal: Soft. Bowel sounds are normal. She exhibits no distension and no mass. There is no tenderness.    Musculoskeletal: Tenderness on palpation of right thigh muscle  Neurological: She is alert and oriented to person, place, and time.  normal gait and coordination. Skin: Skin is warm and dry. No evidence of coronary bruising  Psychiatric: She has a normal mood and affect.        Assessment & Plan:  1. Pain of right thigh Unclear etiology We'll send off a hypercoagulable panel The plan is to refer her to a rheumatologist especially in the setting of bruising and I have given her the option of self pay given she has no medical coverage. She would like to wait until Medicaid is approved prior to referral. I have provided a letter regarding limitations at her job due to her medical conditions as she is currently hoping to begin the process of disability application. - acetaminophen-codeine (TYLENOL #3) 300-30 MG tablet; Take 1 tablet by mouth every 12 (twelve) hours as needed for moderate pain.  Dispense: 60 tablet; Refill: 0  2. Facial flushing Chest x-ray, carotid Doppler unrevealing.  3. Coordination impairment MRI of cervical, thoracic and lumbar spine negative for demyelinating disease but reveals degenerative changes which have discussed with the patient There is no explanation for her coordination problems which is absent on my exam.   4. Diabetes mellitus without complication ([place]) Controlled with A1c of 5.8 - Exenatide ER (BYDUREON) 2 MG PEN; INJECT 2 MG INTO THE SKIN ONCE A WEEK.  Dispense: 12 each; Refill: 3

## 2017-03-15 MED FILL — ACYCLOVIR 400 MG TABLET: 400 | 30 days supply | Qty: 60 | Fill #1

## 2017-03-15 MED FILL — BYDUREON 2 MG PEN INJECT: 2 | 30 days supply | Qty: 4 | Fill #2

## 2017-03-15 MED FILL — AMLODIPINE BESYLATE 10 MG T: 10 | 30 days supply | Qty: 30 | Fill #2

## 2017-03-17 ENCOUNTER — Encounter: Payer: Self-pay | Admitting: Pharmacist

## 2017-03-17 LAB — FACTOR V LEIDEN

## 2017-03-17 LAB — PROTEIN C ACTIVITY: Protein C Activity: 133 % (ref 73–180)

## 2017-03-17 LAB — HOMOCYSTEINE: Homocysteine: 9.8 umol/L (ref 0.0–15.0)

## 2017-03-17 LAB — LUPUS ANTICOAGULANT PANEL
Dilute Viper Venom Time: 30.3 s (ref 0.0–47.0)
PTT Lupus Anticoagulant: 35.3 s (ref 0.0–51.9)

## 2017-03-17 LAB — PROTEIN C, TOTAL: Protein C Antigen: 103 % (ref 60–150)

## 2017-03-17 LAB — PROTEIN S, TOTAL AND FREE
Protein S Ag, Free: 84 % (ref 57–157)
Protein S Ag, Total: 87 % (ref 60–150)

## 2017-03-17 LAB — FACTOR 2 ASSAY: Factor II Activity: 133 % (ref 50–154)

## 2017-03-17 NOTE — Progress Notes (Signed)
Prior authorization completed for Bydureon through [facility] Medicaid. Approval #[id number]

## 2017-04-05 ENCOUNTER — Encounter: Payer: Self-pay | Admitting: Family Medicine

## 2017-04-05 ENCOUNTER — Other Ambulatory Visit (HOSPITAL_COMMUNITY)
Admission: RE | Admit: 2017-04-05 | Discharge: 2017-04-05 | Disposition: A | Discharge status: Home / Self Care | Payer: Medicaid Other | Source: Ambulatory Visit | Attending: Family Medicine | Admitting: Family Medicine

## 2017-04-05 ENCOUNTER — Ambulatory Visit: Payer: Medicaid Other | Attending: Family Medicine | Admitting: Family Medicine

## 2017-04-05 VITALS — BP 124/83 | HR 70 | Temp 97.9°F | Resp 16 | Wt 183.4 lb

## 2017-04-05 DIAGNOSIS — E119 Type 2 diabetes mellitus without complications: Secondary | ICD-10-CM | POA: Diagnosis not present

## 2017-04-05 DIAGNOSIS — R609 Edema, unspecified: Secondary | ICD-10-CM | POA: Insufficient documentation

## 2017-04-05 DIAGNOSIS — R32 Unspecified urinary incontinence: Secondary | ICD-10-CM | POA: Insufficient documentation

## 2017-04-05 DIAGNOSIS — N898 Other specified noninflammatory disorders of vagina: Secondary | ICD-10-CM | POA: Diagnosis not present

## 2017-04-05 DIAGNOSIS — R0982 Postnasal drip: Secondary | ICD-10-CM | POA: Diagnosis not present

## 2017-04-05 DIAGNOSIS — N393 Stress incontinence (female) (male): Secondary | ICD-10-CM | POA: Diagnosis not present

## 2017-04-05 DIAGNOSIS — F329 Major depressive disorder, single episode, unspecified: Secondary | ICD-10-CM | POA: Diagnosis not present

## 2017-04-05 DIAGNOSIS — Z87440 Personal history of urinary (tract) infections: Secondary | ICD-10-CM | POA: Diagnosis not present

## 2017-04-05 DIAGNOSIS — F419 Anxiety disorder, unspecified: Secondary | ICD-10-CM | POA: Insufficient documentation

## 2017-04-05 DIAGNOSIS — H539 Unspecified visual disturbance: Secondary | ICD-10-CM | POA: Diagnosis not present

## 2017-04-05 DIAGNOSIS — M549 Dorsalgia, unspecified: Secondary | ICD-10-CM | POA: Insufficient documentation

## 2017-04-05 DIAGNOSIS — R600 Localized edema: Secondary | ICD-10-CM | POA: Diagnosis not present

## 2017-04-05 DIAGNOSIS — R05 Cough: Secondary | ICD-10-CM | POA: Insufficient documentation

## 2017-04-05 DIAGNOSIS — Z880 Allergy status to penicillin: Secondary | ICD-10-CM | POA: Insufficient documentation

## 2017-04-05 DIAGNOSIS — I10 Essential (primary) hypertension: Secondary | ICD-10-CM | POA: Diagnosis not present

## 2017-04-05 DIAGNOSIS — R300 Dysuria: Secondary | ICD-10-CM

## 2017-04-05 DIAGNOSIS — M79651 Pain in right thigh: Secondary | ICD-10-CM

## 2017-04-05 DIAGNOSIS — N3946 Mixed incontinence: Secondary | ICD-10-CM | POA: Insufficient documentation

## 2017-04-05 LAB — POCT URINALYSIS DIPSTICK
Bilirubin, UA: NEGATIVE
Glucose, UA: NEGATIVE
Ketones, UA: NEGATIVE
Leukocytes, UA: NEGATIVE
Nitrite, UA: NEGATIVE
Protein, UA: NEGATIVE
Spec Grav, UA: 1.025 (ref 1.010–1.025)
Urobilinogen, UA: 0.2 E.U./dL
pH, UA: 6 (ref 5.0–8.0)

## 2017-04-05 MED ORDER — OXYBUTYNIN CHLORIDE 5 MG PO TABS: 5.0000 mg | ORAL_TABLET | Freq: Two times a day (BID) | ORAL | 3 refills | Status: DC

## 2017-04-05 MED ORDER — CETIRIZINE HCL 10 MG PO TABS: 10.0000 mg | ORAL_TABLET | Freq: Every day | ORAL | 1 refills | Status: DC

## 2017-04-05 MED FILL — ALL DAY ALLERGY 10 MG TAB: 10 | 30 days supply | Qty: 30 | Fill #0

## 2017-04-05 MED FILL — OXYBUTYNIN 5 MG TABLET: 5 | 30 days supply | Qty: 60 | Fill #0

## 2017-04-05 MED FILL — traZODone HCL 100 MG TABS: 100 | 30 days supply | Qty: 30 | Fill #0

## 2017-04-05 MED FILL — SERTRALINE HCL 50 MG TABLET: 50 | 30 days supply | Qty: 30 | Fill #1 | Status: TO

## 2017-04-05 NOTE — Patient Instructions (Signed)
Urinary Incontinence Urinary incontinence is the involuntary loss of urine from your bladder. What are the causes? There are many causes of urinary incontinence. They include:  Medicines.  Infections.  Prostatic enlargement, leading to overflow of urine from your bladder.  Surgery.  Neurological diseases.  Emotional factors.  What are the signs or symptoms? Urinary Incontinence can be divided into four types: 1. Urge incontinence. Urge incontinence is the involuntary loss of urine before you have the opportunity to go to the bathroom. There is a sudden urge to void but not enough time to reach a bathroom. 2. Stress incontinence. Stress incontinence is the sudden loss of urine with any activity that forces urine to pass. It is commonly caused by anatomical changes to the pelvis and sphincter areas of your body. 3. Overflow incontinence. Overflow incontinence is the loss of urine from an obstructed opening to your bladder. This results in a backup of urine and a resultant buildup of pressure within the bladder. When the pressure within the bladder exceeds the closing pressure of the sphincter, the urine overflows, which causes incontinence, similar to water overflowing a dam. 4. Total incontinence. Total incontinence is the loss of urine as a result of the inability to store urine within your bladder.  How is this diagnosed? Evaluating the cause of incontinence may require:  A thorough and complete medical and obstetric history.  A complete physical exam.  Laboratory tests such as a urine culture and sensitivities.  When additional tests are indicated, they can include:  An ultrasound exam.  Kidney and bladder X-rays.  Cystoscopy. This is an exam of the bladder using a narrow scope.  Urodynamic testing to test the nerve function to the bladder and sphincter areas.  How is this treated? Treatment for urinary incontinence depends on the cause:  For urge incontinence caused  by a bacterial infection, antibiotics will be prescribed. If the urge incontinence is related to medicines you take, your health care provider may have you change the medicine.  For stress incontinence, surgery to re-establish anatomical support to the bladder or sphincter, or both, will often correct the condition.  For overflow incontinence caused by an enlarged prostate, an operation to open the channel through the enlarged prostate will allow the flow of urine out of the bladder. In women with fibroids, a hysterectomy may be recommended.  For total incontinence, surgery on your urinary sphincter may help. An artificial urinary sphincter (an inflatable cuff placed around the urethra) may be required. In women who have developed a hole-like passage between their bladder and vagina (vesicovaginal fistula), surgery to close the fistula often is required.  Follow these instructions at home:  Normal daily hygiene and the use of pads or adult diapers that are changed regularly will help prevent odors and skin damage.  Avoid caffeine. It can overstimulate your bladder.  Use the bathroom regularly. Try about every 2-3 hours to go to the bathroom, even if you do not feel the need to do so. Take time to empty your bladder completely. After urinating, wait a minute. Then try to urinate again.  For causes involving nerve dysfunction, keep a log of the medicines you take and a journal of the times you go to the bathroom. Contact a health care provider if:  You experience worsening of pain instead of improvement in pain after your procedure.  Your incontinence becomes worse instead of better. Get help right away if:  You experience fever or shaking chills.  You are unable to   pass your urine.  You have redness spreading into your groin or down into your thighs. This information is not intended to replace advice given to you by your health care provider. Make sure you discuss any questions you have  with your health care provider. Document Released: 11/19/2004 Document Revised: 05/22/2016 Document Reviewed: 03/21/2013 Elsevier Interactive Patient Education  2018 [employer]  

## 2017-04-05 NOTE — Progress Notes (Signed)
Subjective:  Patient ID: Amber Rose, female    DOB: July 24, 1976  Age: 41 y.o. MRN: [id number]  CC: Cough; Dysuria; and Vaginal Discharge   HPI Amber Rose She is a 41 year old female with type 2 diabetes mellitus (A1c 5.8), hypertension, depression and anxiety, hydradenitis, tobacco abuse who presents with acute complains of a four-day history of light yellowish discharge, dysuria and noticed at the end of urination with no vaginal itching ,mild lower abdominal pain. She has one sexual partner. She also complains of a 2 week history of cough which is sometimes productive of light yellowish sputum and she has associated stress incontinence whenever she coughs. She denies facial pain, fever, wheezing or shortness of.   I had completed a disability form for her due to complains of  thigh pains and facial flushing and swelling. MRI revealed degenerative changes but no evidence of demyelinating disease , carotid Doppler was negative for stenosis.  Facial edema and flushing are worse on lying down but resolves when she sits up but denies shortness of breath on a daily basis, pedal edema or chest pains. Autoimmune labs so far have been negative; workup from previous physicians revealed elevated lupus anticoagulant but other hypercoagulable labs were negative. She has pictures to this effect with associated periorbital edema. She complains of coordination problems and sometimes feels her hands cramp up on her at other times she has back pain and sometimes visual disturbances. The plan had been to refer her to rheumatology once her insurance came through.  She does have a history of urinary incontinence status post transurethral intradetrusor botox  injections and trans-obturator sling.   Right thigh pain is described as throbbing and radiates from her hip down to her toe and is worse when she sleeps. She previously complained of bilateral thigh pain, right worse than left which is worse  at night and feels like an ache. She was previously given diazepam while in [place] for this symptoms. Has also noticed bruises on her thighs with no history of trauma. Symptoms are worse on days when she has been more active and has walked a lot.  Past Medical History:  Diagnosis Date  . Arthritis   . Chronic UTI   . Diabetes mellitus without complication ([place])   . Hypertension     No past surgical history on file.  Allergies  Allergen Reactions  . Metformin And Related Nausea And Vomiting  . Penicillins   . Xanax [Alprazolam]   . Glyburide Other (See Comments)    "blood sugar dropped uncontrollably"  . Linagliptin Diarrhea and Other (See Comments)    Stomach pain, sinus infection  . Moxifloxacin Diarrhea     Outpatient Medications Prior to Visit  Medication Sig Dispense Refill  . acetaminophen-codeine (TYLENOL #3) 300-30 MG tablet Take 1 tablet by mouth every 12 (twelve) hours as needed for moderate pain. 60 tablet 0  . acyclovir (ZOVIRAX) 400 MG tablet Take 1 tablet (400 mg total) by mouth 2 (two) times daily. 60 tablet 3  . amLODipine (NORVASC) 10 MG tablet Take 1 tablet (10 mg total) by mouth daily. 90 tablet 3  . diazepam (VALIUM) 5 MG tablet Take 5 mg by mouth every 6 (six) hours as needed for anxiety.    . Exenatide ER (BYDUREON) 2 MG PEN INJECT 2 MG INTO THE SKIN ONCE A WEEK. 12 each 3  . lisinopril (PRINIVIL,ZESTRIL) 20 MG tablet Take 1 tablet (20 mg total) by mouth daily. 30 tablet 2  . meloxicam (MOBIC) 7.5  MG tablet TAKE 1 TABLET BY MOUTH DAILY. 30 tablet 0  . methocarbamol (ROBAXIN-750) 750 MG tablet Take 1 tablet (750 mg total) by mouth 4 (four) times daily. 60 tablet 2  . sertraline (ZOLOFT) 50 MG tablet Take 1 tablet (50 mg total) by mouth daily. 30 tablet 3  . traZODone (DESYREL) 100 MG tablet Take 1 tablet (100 mg total) by mouth at bedtime as needed for sleep. 30 tablet 3  . atorvastatin (LIPITOR) 40 MG tablet Take 1 tablet (40 mg total) by mouth daily.  (Patient not taking: Reported on 02/02/2017) 30 tablet 3  . chlorhexidine (HIBICLENS) 4 % external liquid Apply topically daily as needed. 120 mL 0  . furosemide (LASIX) 20 MG tablet Take 1 tablet (20 mg total) by mouth daily. (Patient not taking: Reported on 04/05/2017) 30 tablet 3  . mupirocin ointment (BACTROBAN) 2 % Apply to AA bid X 1 week 22 g 0   No facility-administered medications prior to visit.     ROS Review of Systems Constitutional: Negative for activity change, appetite change and fatigue.  HENT: Negative for congestion, sinus pressure and sore throat.   Eyes: Negative for visual disturbance.  Respiratory: Positive for cough, negative for chest tightness, shortness of breath and wheezing.   Cardiovascular: Negative for chest pain and palpitations.  Gastrointestinal: Negative for abdominal distention, abdominal pain and constipation.  Endocrine: Negative for polydipsia.  Genitourinary: see hpi.  Musculoskeletal: See history of present illness  Skin: Negative  Neurological: Negative for tremors, light-headedness and numbness.  Hematological: See history of present illness.  Psychiatric/Behavioral: Negative for agitation and behavioral problems.     Objective:  BP 124/83 (BP Location: Left Arm, Patient Position: Sitting, Cuff Size: Large)   Pulse 70   Temp 97.9 F (36.6 C) (Oral)   Resp 16   Wt 183 lb 6.4 oz (83.2 kg)   LMP 03/26/2017   SpO2 98%   BMI 31.98 kg/m   BP/Weight 04/05/2017 3/41/9622 12/05/7987  Systolic BP 211 941 740  Diastolic BP 83 79 76  Wt. (Lbs) 183.4 180.8 182.2  BMI 31.98 31.52 31.77      Physical Exam Constitutional: She is oriented to person, place, and time. She appears well-developed and well-nourished.  HEENT: Periorbital and facial edema. Oropharynx with postnasal drip. Normal tympanic membranes bilaterally Cardiovascular: Normal rate, normal heart sounds and intact distal pulses.   No murmur heard. Pulmonary/Chest: Effort normal and  breath sounds normal. She has no wheezes. She has no rales. She exhibits no tenderness.  Abdominal: Soft. Bowel sounds are normal. She exhibits no distension and no mass. There is no tenderness.  Genitourinary: Normal external genitalia, off whitish discharge in vagina  Musculoskeletal: Tenderness on palpation of right thigh muscle; limited range of motion of both hips Neurological: She is alert and oriented to person, place, and time.  normal gait and coordination. Skin: Skin is warm and dry. No evidence of bruising  Psychiatric: She has a normal mood and affect.   Lab Results  Component Value Date   HGBA1C 5.8 02/02/2017    Assessment & Plan:   1. Dysuria Urinalysis is negative for UTI - POCT urinalysis dipstick  2. Post-nasal drip - cetirizine (ZYRTEC) 10 MG tablet; Take 1 tablet (10 mg total) by mouth daily.  Dispense: 30 tablet; Refill: 1  3. Facial edema Workup so far unrevealing - Ambulatory referral to Rheumatology  4. Pain of right thigh Currently taking muscle relaxants and advised to perform activity as tolerated - Ambulatory  referral to Rheumatology  5. Stress incontinence of urine Status post previous bladder surgeries - oxybutynin (DITROPAN) 5 MG tablet; Take 1 tablet (5 mg total) by mouth 2 (two) times daily.  Dispense: 60 tablet; Refill: 3  6. Vaginal discharge - Cervicovaginal ancillary only  7. Diabetes mellitus without complication ([place]) Controlled with A1c of 5.8 - Ambulatory referral to Ophthalmology   Meds ordered this encounter  Medications  . cetirizine (ZYRTEC) 10 MG tablet    Sig: Take 1 tablet (10 mg total) by mouth daily.    Dispense:  30 tablet    Refill:  1  . oxybutynin (DITROPAN) 5 MG tablet    Sig: Take 1 tablet (5 mg total) by mouth 2 (two) times daily.    Dispense:  60 tablet    Refill:  3    Follow-up: Return in about 3 months (around 07/06/2017) for Follow-up on chronic medical conditions.   Arnoldo Morale MD

## 2017-04-06 ENCOUNTER — Encounter: Payer: Self-pay | Admitting: Family Medicine

## 2017-04-06 ENCOUNTER — Other Ambulatory Visit: Payer: Self-pay | Admitting: Family Medicine

## 2017-04-06 LAB — CERVICOVAGINAL ANCILLARY ONLY
Chlamydia: NEGATIVE
Neisseria Gonorrhea: NEGATIVE
Wet Prep (BD Affirm): POSITIVE — AB

## 2017-04-06 MED ORDER — METRONIDAZOLE 0.75 % VA GEL: 1.0000 | Freq: Every day | VAGINAL | 0 refills | Status: DC

## 2017-04-07 ENCOUNTER — Encounter: Payer: Self-pay | Admitting: Family Medicine

## 2017-04-07 ENCOUNTER — Other Ambulatory Visit: Payer: Self-pay | Admitting: Family Medicine

## 2017-04-07 MED ORDER — CLINDAMYCIN HCL 300 MG PO CAPS: 300.0000 mg | ORAL_CAPSULE | Freq: Two times a day (BID) | ORAL | 0 refills | Status: DC

## 2017-04-07 MED FILL — CLINDAMYCIN HCL 300 MG CAP: 300 | 7 days supply | Qty: 14 | Fill #0

## 2017-04-07 NOTE — Progress Notes (Signed)
Patient complaints of intolerance of metronidazole or MetroGel. We'll place on clindamycin.

## 2017-04-07 NOTE — Telephone Encounter (Signed)
Patient is requesting an alternative antibiotic. Please advise.

## 2017-04-07 NOTE — Telephone Encounter (Signed)
Patient concerns regarding her disability denial over 5 years and new medical conditions causing limitations and concerns. Please advise.

## 2017-04-12 ENCOUNTER — Other Ambulatory Visit: Payer: Self-pay | Admitting: Family Medicine

## 2017-04-12 DIAGNOSIS — E0800 Diabetes mellitus due to underlying condition with hyperosmolarity without nonketotic hyperglycemic-hyperosmolar coma (NKHHC): Secondary | ICD-10-CM

## 2017-04-12 MED FILL — LISINOPRIL 20 MG TABLET: 20 | 30 days supply | Qty: 30 | Fill #2

## 2017-04-12 MED FILL — AMLODIPINE BESYLATE 10 MG T: 10 | 30 days supply | Qty: 30 | Fill #3

## 2017-04-12 MED FILL — METHOCARBAMOL 750 MG TABLET: 750 | 15 days supply | Qty: 60 | Fill #1 | Status: TO

## 2017-04-14 MED FILL — ACYCLOVIR 400 MG TABLET: 400 | 30 days supply | Qty: 60 | Fill #2

## 2017-04-19 ENCOUNTER — Encounter: Payer: Self-pay | Admitting: Family Medicine

## 2017-04-20 ENCOUNTER — Other Ambulatory Visit: Payer: Self-pay | Admitting: Pharmacist

## 2017-04-20 ENCOUNTER — Other Ambulatory Visit: Payer: Self-pay | Admitting: Family Medicine

## 2017-04-20 DIAGNOSIS — E119 Type 2 diabetes mellitus without complications: Secondary | ICD-10-CM

## 2017-04-20 DIAGNOSIS — N393 Stress incontinence (female) (male): Secondary | ICD-10-CM

## 2017-04-20 MED ORDER — EXENATIDE ER 2 MG ~~LOC~~ PEN: PEN_INJECTOR | SUBCUTANEOUS | 3 refills | Status: DC

## 2017-04-20 NOTE — Telephone Encounter (Signed)
Patient request

## 2017-04-21 ENCOUNTER — Encounter: Payer: Self-pay | Admitting: Family Medicine

## 2017-04-21 ENCOUNTER — Ambulatory Visit: Payer: Medicaid Other | Attending: Family Medicine | Admitting: Family Medicine

## 2017-04-21 VITALS — BP 112/68 | HR 76 | Temp 98.2°F | Wt 183.0 lb

## 2017-04-21 DIAGNOSIS — E119 Type 2 diabetes mellitus without complications: Secondary | ICD-10-CM | POA: Diagnosis not present

## 2017-04-21 DIAGNOSIS — F329 Major depressive disorder, single episode, unspecified: Secondary | ICD-10-CM | POA: Diagnosis not present

## 2017-04-21 DIAGNOSIS — Z880 Allergy status to penicillin: Secondary | ICD-10-CM | POA: Insufficient documentation

## 2017-04-21 DIAGNOSIS — Z79899 Other long term (current) drug therapy: Secondary | ICD-10-CM | POA: Diagnosis not present

## 2017-04-21 DIAGNOSIS — G629 Polyneuropathy, unspecified: Secondary | ICD-10-CM | POA: Insufficient documentation

## 2017-04-21 DIAGNOSIS — I10 Essential (primary) hypertension: Secondary | ICD-10-CM | POA: Insufficient documentation

## 2017-04-21 DIAGNOSIS — N760 Acute vaginitis: Secondary | ICD-10-CM | POA: Insufficient documentation

## 2017-04-21 DIAGNOSIS — Z888 Allergy status to other drugs, medicaments and biological substances status: Secondary | ICD-10-CM | POA: Diagnosis not present

## 2017-04-21 DIAGNOSIS — B9689 Other specified bacterial agents as the cause of diseases classified elsewhere: Secondary | ICD-10-CM | POA: Insufficient documentation

## 2017-04-21 DIAGNOSIS — F419 Anxiety disorder, unspecified: Secondary | ICD-10-CM | POA: Insufficient documentation

## 2017-04-21 LAB — GLUCOSE, POCT (MANUAL RESULT ENTRY): POC Glucose: 91 mg/dl (ref 70–99)

## 2017-04-21 MED ORDER — GABAPENTIN 300 MG PO CAPS: 300.0000 mg | ORAL_CAPSULE | Freq: Two times a day (BID) | ORAL | 3 refills | Status: DC

## 2017-04-21 MED ORDER — METRONIDAZOLE 0.75 % VA GEL: 1.0000 | Freq: Every day | VAGINAL | 3 refills | Status: DC

## 2017-04-21 MED FILL — GABAPENTIN 300 MG CAPSULE: 300 | 30 days supply | Qty: 60 | Fill #0 | Status: TO

## 2017-04-21 NOTE — Patient Instructions (Signed)

## 2017-04-21 NOTE — Progress Notes (Signed)
Subjective:  Patient ID: Amber Rose, female    DOB: 01-19-1976  Age: 41 y.o. MRN: [id number]  CC: Hand Pain   HPI Amber Rose is a 41 year old female with type 2 diabetes mellitus (A1c 5.8), hypertension, depression and anxiety, hydradenitis, tobacco abuse who presents today with complaints of recurrent bacterial vaginosis infections.  She had been given a prescription for MetroGel 2 weeks ago but had called the clinic complaining of intolerance to both MetroGel and oral metronidazole. This was switched to oral clindamycin and she was informed that the latter might be less effective. She presents today stating she has had a recurrence of bacterial vaginosis evidenced by a light yellowish discharge which started yesterday prior to her period which she started today.  She also complains of pain on the palmar aspect of the right hand which shoots up onto her shoulder especially when she applies pressure on her right hand or reaches behind to pickup a weight. She noticed this 10 days ago and has had a history of such in the past.  Past Medical History:  Diagnosis Date  . Arthritis   . Chronic UTI   . Diabetes mellitus without complication ([place])   . Hypertension     No past surgical history on file.  Allergies  Allergen Reactions  . Metformin And Related Nausea And Vomiting  . Penicillins   . Xanax [Alprazolam]   . Glyburide Other (See Comments)    "blood sugar dropped uncontrollably"  . Linagliptin Diarrhea and Other (See Comments)    Stomach pain, sinus infection  . Moxifloxacin Diarrhea     Outpatient Medications Prior to Visit  Medication Sig Dispense Refill  . acetaminophen-codeine (TYLENOL #3) 300-30 MG tablet Take 1 tablet by mouth every 12 (twelve) hours as needed for moderate pain. 60 tablet 0  . acyclovir (ZOVIRAX) 400 MG tablet Take 1 tablet (400 mg total) by mouth 2 (two) times daily. 60 tablet 3  . amLODipine (NORVASC) 10 MG tablet Take 1 tablet (10  mg total) by mouth daily. 90 tablet 3  . diazepam (VALIUM) 5 MG tablet Take 5 mg by mouth every 6 (six) hours as needed for anxiety.    . Exenatide ER (BYDUREON) 2 MG PEN INJECT 2 MG INTO THE SKIN ONCE A WEEK. 12 each 3  . lisinopril (PRINIVIL,ZESTRIL) 20 MG tablet Take 1 tablet (20 mg total) by mouth daily. 30 tablet 2  . meloxicam (MOBIC) 7.5 MG tablet TAKE 1 TABLET BY MOUTH DAILY. 30 tablet 0  . methocarbamol (ROBAXIN-750) 750 MG tablet Take 1 tablet (750 mg total) by mouth 4 (four) times daily. 60 tablet 2  . mupirocin ointment (BACTROBAN) 2 % Apply to AA bid X 1 week 22 g 0  . sertraline (ZOLOFT) 50 MG tablet Take 1 tablet (50 mg total) by mouth daily. 30 tablet 3  . traZODone (DESYREL) 100 MG tablet Take 1 tablet (100 mg total) by mouth at bedtime as needed for sleep. 30 tablet 3  . atorvastatin (LIPITOR) 40 MG tablet Take 1 tablet (40 mg total) by mouth daily. (Patient not taking: Reported on 02/02/2017) 30 tablet 3  . cetirizine (ZYRTEC) 10 MG tablet Take 1 tablet (10 mg total) by mouth daily. (Patient not taking: Reported on 04/21/2017) 30 tablet 1  . chlorhexidine (HIBICLENS) 4 % external liquid Apply topically daily as needed. (Patient not taking: Reported on 04/21/2017) 120 mL 0  . oxybutynin (DITROPAN) 5 MG tablet Take 1 tablet (5 mg total) by mouth 2 (two)  times daily. (Patient not taking: Reported on 04/21/2017) 60 tablet 3  . clindamycin (CLEOCIN) 300 MG capsule Take 1 capsule (300 mg total) by mouth 2 (two) times daily. (Patient not taking: Reported on 04/21/2017) 14 capsule 0  . furosemide (LASIX) 20 MG tablet Take 1 tablet (20 mg total) by mouth daily. (Patient not taking: Reported on 04/05/2017) 30 tablet 3  . metroNIDAZOLE (METROGEL VAGINAL) 0.75 % vaginal gel Place 1 Applicatorful vaginally at bedtime. (Patient not taking: Reported on 04/21/2017) 70 g 0   No facility-administered medications prior to visit.     ROS Review of Systems Constitutional: Negative for activity change,  appetite change and fatigue.  HENT: Negative for congestion, sinus pressure and sore throat.   Eyes: Negative for visual disturbance.  Respiratory: Positive for cough, negative for chest tightness, shortness of breath and wheezing.   Cardiovascular: Negative for chest pain and palpitations.  Gastrointestinal: Negative for abdominal distention, abdominal pain and constipation.  Endocrine: Negative for polydipsia.  Genitourinary: see hpi.  Musculoskeletal: See history of present illness  Skin: Negative  Neurological: Negative for tremors, light-headedness and numbness.  Hematological: See history of present illness.  Psychiatric/Behavioral: Negative for agitation and behavioral problems.  Objective:  BP 112/68   Pulse 76   Temp 98.2 F (36.8 C) (Oral)   Wt 183 lb (83 kg)   LMP 03/26/2017   SpO2 97%   BMI 31.91 kg/m   BP/Weight 04/21/2017 04/05/2017 3/49/1791  Systolic BP 505 697 948  Diastolic BP 68 83 79  Wt. (Lbs) 183 183.4 180.8  BMI 31.91 31.98 31.52      Physical Exam Constitutional: She is oriented to person, place, and time. She appears well-developed and well-nourished.  HEENT: Periorbital and facial edema. Oropharynx with postnasal drip. Normal tympanic membranes bilaterally Cardiovascular: Normal rate, normal heart sounds and intact distal pulses.   No murmur heard. Pulmonary/Chest: Effort normal and breath sounds normal. She has no wheezes. She has no rales. She exhibits no tenderness.  Abdominal: Soft. Bowel sounds are normal. She exhibits no distension and no mass. There is no tenderness.  Genitourinary: Normal external genitalia, off whitish discharge in vagina  Musculoskeletal: Tenderness on palpation of right thigh muscle; limited range of motion of both hips Normal appearance of both hand, no thenar or hyperthenar atrophic negative phalen and Tinel signs. Positive Neer Hawkins sign on the right upper extremity.  Neurological: She is alert and oriented to  person, place, and time.  normal gait and coordination. Skin: Skin is warm and dry. No evidence of bruising  Psychiatric: She has a normal mood and affect.    Lab Results  Component Value Date   HGBA1C 5.8 02/02/2017    Assessment & Plan:   1. Diabetes mellitus without complication ([place]) Controlled with A1c of 5.8 Continue current management POCT glucose (manual entry)  2. Bacterial vaginosis Recurrent MetroNIDAZOLE (METROGEL VAGINAL) 0.75 % vaginal gel; Place 1 Applicatorful vaginally at bedtime.  Dispense: 70 g; Refill: 3  3. Neuropathy If symptoms persist, will refer to Orthopedics to evaluate for possible hand injections - gabapentin (NEURONTIN) 300 MG capsule; Take 1 capsule (300 mg total) by mouth 2 (two) times daily.  Dispense: 60 capsule; Refill: 3   Meds ordered this encounter  Medications  . metroNIDAZOLE (METROGEL VAGINAL) 0.75 % vaginal gel    Sig: Place 1 Applicatorful vaginally at bedtime.    Dispense:  70 g    Refill:  3  . gabapentin (NEURONTIN) 300 MG capsule  Sig: Take 1 capsule (300 mg total) by mouth 2 (two) times daily.    Dispense:  60 capsule    Refill:  3    Follow-up: Return for Follow-up of chronic medical conditions, keep previously scheduled appointment.   Arnoldo Morale MD

## 2017-04-22 ENCOUNTER — Encounter: Payer: Self-pay | Admitting: Family Medicine

## 2017-04-26 ENCOUNTER — Encounter: Payer: Self-pay | Admitting: Family Medicine

## 2017-04-26 NOTE — Telephone Encounter (Signed)
Patient request

## 2017-04-27 ENCOUNTER — Other Ambulatory Visit: Payer: Self-pay | Admitting: Family Medicine

## 2017-04-27 DIAGNOSIS — F988 Other specified behavioral and emotional disorders with onset usually occurring in childhood and adolescence: Secondary | ICD-10-CM

## 2017-04-27 LAB — HM DIABETES EYE EXAM

## 2017-04-30 ENCOUNTER — Encounter: Payer: Self-pay | Admitting: Family Medicine

## 2017-04-30 NOTE — Telephone Encounter (Signed)
Please advise on this request

## 2017-05-03 NOTE — Telephone Encounter (Signed)
Please inform patient of letter being available for pickup.

## 2017-05-04 ENCOUNTER — Telehealth: Payer: Self-pay | Admitting: *Deleted

## 2017-05-04 NOTE — Telephone Encounter (Signed)
Pt advised to p/u requested letter.

## 2017-05-07 NOTE — Telephone Encounter (Signed)
Pt was called and LVM inform her that the letter for work is ready for pick up

## 2017-05-14 ENCOUNTER — Encounter: Payer: Self-pay | Admitting: Family Medicine

## 2017-05-14 ENCOUNTER — Other Ambulatory Visit: Payer: Self-pay | Admitting: Family Medicine

## 2017-05-14 ENCOUNTER — Ambulatory Visit: Payer: Medicaid Other | Attending: Family Medicine | Admitting: Family Medicine

## 2017-05-14 VITALS — BP 112/71 | HR 76 | Temp 98.9°F | Resp 18 | Ht 60.0 in | Wt 185.0 lb

## 2017-05-14 DIAGNOSIS — E119 Type 2 diabetes mellitus without complications: Secondary | ICD-10-CM | POA: Diagnosis not present

## 2017-05-14 DIAGNOSIS — F419 Anxiety disorder, unspecified: Secondary | ICD-10-CM | POA: Diagnosis not present

## 2017-05-14 DIAGNOSIS — F32A Depression, unspecified: Secondary | ICD-10-CM

## 2017-05-14 DIAGNOSIS — E0800 Diabetes mellitus due to underlying condition with hyperosmolarity without nonketotic hyperglycemic-hyperosmolar coma (NKHHC): Secondary | ICD-10-CM

## 2017-05-14 DIAGNOSIS — M797 Fibromyalgia: Secondary | ICD-10-CM | POA: Diagnosis not present

## 2017-05-14 DIAGNOSIS — F329 Major depressive disorder, single episode, unspecified: Secondary | ICD-10-CM | POA: Insufficient documentation

## 2017-05-14 DIAGNOSIS — M5136 Other intervertebral disc degeneration, lumbar region: Secondary | ICD-10-CM | POA: Diagnosis not present

## 2017-05-14 DIAGNOSIS — R202 Paresthesia of skin: Secondary | ICD-10-CM

## 2017-05-14 DIAGNOSIS — F988 Other specified behavioral and emotional disorders with onset usually occurring in childhood and adolescence: Secondary | ICD-10-CM

## 2017-05-14 DIAGNOSIS — M5137 Other intervertebral disc degeneration, lumbosacral region: Secondary | ICD-10-CM | POA: Insufficient documentation

## 2017-05-14 DIAGNOSIS — B079 Viral wart, unspecified: Secondary | ICD-10-CM | POA: Diagnosis not present

## 2017-05-14 DIAGNOSIS — I10 Essential (primary) hypertension: Secondary | ICD-10-CM | POA: Diagnosis not present

## 2017-05-14 DIAGNOSIS — M51379 Other intervertebral disc degeneration, lumbosacral region without mention of lumbar back pain or lower extremity pain: Secondary | ICD-10-CM | POA: Insufficient documentation

## 2017-05-14 LAB — GLUCOSE, POCT (MANUAL RESULT ENTRY): POC Glucose: 163 mg/dl — AB (ref 70–99)

## 2017-05-14 LAB — POCT GLYCOSYLATED HEMOGLOBIN (HGB A1C): Hemoglobin A1C: 5.9

## 2017-05-14 MED ORDER — ACCU-CHEK SOFTCLIX LANCET DEV MISC: 5 refills | Status: AC

## 2017-05-14 MED ORDER — DULOXETINE HCL 60 MG PO CPEP
60.0000 mg | ORAL_CAPSULE | Freq: Every day | ORAL | 3 refills | Status: DC
Start: 2017-05-14 — End: 2017-12-13

## 2017-05-14 MED ORDER — PODOPHYLLUM RESIN 25 % EX SOLN: 1.0000 mL | CUTANEOUS | 0 refills | Status: DC

## 2017-05-14 MED ORDER — ACCU-CHEK AVIVA DEVI: 0 refills | Status: DC

## 2017-05-14 MED ORDER — GLUCOSE BLOOD VI STRP: ORAL_STRIP | 12 refills | Status: DC

## 2017-05-14 NOTE — Patient Instructions (Signed)
Myofascial Pain Syndrome and Fibromyalgia Myofascial pain syndrome and fibromyalgia are both pain disorders. This pain may be felt mainly in your muscles.  Myofascial pain syndrome: ? Always has trigger points or tender points in the muscle that will cause pain when pressed. The pain may come and go. ? Usually affects your neck, upper back, and shoulder areas. The pain often radiates into your arms and hands.  Fibromyalgia: ? Has muscle pains and tenderness that come and go. ? Is often associated with fatigue and sleep disturbances. ? Has trigger points. ? Tends to be long-lasting (chronic), but is not life-threatening.  Fibromyalgia and myofascial pain are not the same. However, they often occur together. If you have both conditions, each can make the other worse. Both are common and can cause enough pain and fatigue to make day-to-day activities difficult. What are the causes? The exact causes of fibromyalgia and myofascial pain are not known. People with certain gene types may be more likely to develop fibromyalgia. Some factors can be triggers for both conditions, such as:  Spine disorders.  Arthritis.  Severe injury (trauma) and other physical stressors.  Being under a lot of stress.  A medical illness.  What are the signs or symptoms? Fibromyalgia The main symptom of fibromyalgia is widespread pain and tenderness in your muscles. This can vary over time. Pain is sometimes described as stabbing, shooting, or burning. You may have tingling or numbness, too. You may also have sleep problems and fatigue. You may wake up feeling tired and groggy (fibro fog). Other symptoms may include:  Bowel and bladder problems.  Headaches.  Visual problems.  Problems with odors and noises.  Depression or mood changes.  Painful menstrual periods (dysmenorrhea).  Dry skin or eyes.  Myofascial pain syndrome Symptoms of myofascial pain syndrome include:  Tight, ropy bands of  muscle.  Uncomfortable sensations in muscular areas, such as: ? Aching. ? Cramping. ? Burning. ? Numbness. ? Tingling. ? Muscle weakness.  Trouble moving certain muscles freely (range of motion).  How is this diagnosed? There are no specific tests to diagnose fibromyalgia or myofascial pain syndrome. Both can be hard to diagnose because their symptoms are common in many other conditions. Your health care provider may suspect one or both of these conditions based on your symptoms and medical history. Your health care provider will also do a physical exam. The key to diagnosing fibromyalgia is having pain, fatigue, and other symptoms for more than three months that cannot be explained by another condition. The key to diagnosing myofascial pain syndrome is finding trigger points in muscles that are tender and cause pain elsewhere in your body (referred pain). How is this treated? Treating fibromyalgia and myofascial pain often requires a team of health care providers. This usually starts with your primary provider and a physical therapist. You may also find it helpful to work with alternative health care providers, such as massage therapists or acupuncturists. Treatment for fibromyalgia may include medicines. This may include nonsteroidal anti-inflammatory drugs (NSAIDs), along with other medicines. Treatment for myofascial pain may also include:  NSAIDs.  Cooling and stretching of muscles.  Trigger point injections.  Sound wave (ultrasound) treatments to stimulate muscles.  Follow these instructions at home:  Take medicines only as directed by your health care provider.  Exercise as directed by your health care provider or physical therapist.  Try to avoid stressful situations.  Practice relaxation techniques to control your stress. You may want to try: ? Biofeedback. ? Visual   imagery. ? Hypnosis. ? Muscle relaxation. ? Yoga. ? Meditation.  Talk to your health care provider  about alternative treatments, such as acupuncture or massage treatment.  Maintain a healthy lifestyle. This includes eating a healthy diet and getting enough sleep.  Consider joining a support group.  Do not do activities that stress or strain your muscles. That includes repetitive motions and heavy lifting. Where to find more information:  National Fibromyalgia Association: www.fmaware.org  Arthritis Foundation: www.arthritis.org  American Chronic Pain Association: www.theacpa.org/condition/myofascial-pain Contact a health care provider if:  You have new symptoms.  Your symptoms get worse.  You have side effects from your medicines.  You have trouble sleeping.  Your condition is causing depression or anxiety. This information is not intended to replace advice given to you by your health care provider. Make sure you discuss any questions you have with your health care provider. Document Released: 10/12/2005 Document Revised: 03/19/2016 Document Reviewed: 07/18/2014 Elsevier Interactive Patient Education  2018 Elsevier Inc.  

## 2017-05-14 NOTE — Progress Notes (Signed)
Subjective:  Patient ID: Amber Rose, female    DOB: 07-Aug-1976  Age: 41 y.o. MRN: [id number]  CC: [facility]   HPI Amber Rose is a 41 year old female with type 2 diabetes mellitus (A1c 5.8), hypertension, depression and anxiety, hydradenitis, tobacco abuse Who is currently being worked up for myalgias, arthralgias, chronic back pain, intermittent bruising, paresthesia facial flushing whom I had referred to rheumatology to exclude connective tissue disorder.  She was seen by Dr. Posey Pronto, a rheumatologist with [facility] and was diagnosed with fibromyalgia. She was placed on diazepam and recommendations were for referral to neurology to evaluate her paresthesias and referral for possible epidural spinal injection due to multilevel degenerative disease seen on MRI in the setting of chronic back pain which radiates to her legs. Autoimmune labs have been negative, vitamin D also negative, MRI revealed degenerative changes in cervical, thoracic and lumbar spine.   She has been taking her gabapentin and Robaxin with relief in her symptoms but this is associated with somnolence and so she skips her gabapentin when she has to drive.  She does have a 2 month history of lesion on her left index finger which is not painful but annoying and has not changed in size. She is also concerned about her weight gain but she endorses not exercising and has been less active.  Requests a referral to psych for treatment of ADD as she was previously referred but it went to psychology.  Past Medical History:  Diagnosis Date  . Arthritis   . Chronic UTI   . Diabetes mellitus without complication ([place])   . Hypertension     History reviewed. No pertinent surgical history.  Allergies  Allergen Reactions  . Metformin And Related Nausea And Vomiting  . Penicillins   . Xanax [Alprazolam]   . Glyburide Other (See Comments)    "blood sugar dropped uncontrollably"  . Linagliptin Diarrhea and Other  (See Comments)    Stomach pain, sinus infection  . Moxifloxacin Diarrhea     Outpatient Medications Prior to Visit  Medication Sig Dispense Refill  . acetaminophen-codeine (TYLENOL #3) 300-30 MG tablet Take 1 tablet by mouth every 12 (twelve) hours as needed for moderate pain. 60 tablet 0  . acyclovir (ZOVIRAX) 400 MG tablet Take 1 tablet (400 mg total) by mouth 2 (two) times daily. 60 tablet 3  . amLODipine (NORVASC) 10 MG tablet Take 1 tablet (10 mg total) by mouth daily. 90 tablet 3  . diazepam (VALIUM) 5 MG tablet Take 5 mg by mouth every 6 (six) hours as needed for anxiety.    . Exenatide ER (BYDUREON) 2 MG PEN INJECT 2 MG INTO THE SKIN ONCE A WEEK. 12 each 3  . gabapentin (NEURONTIN) 300 MG capsule Take 1 capsule (300 mg total) by mouth 2 (two) times daily. 60 capsule 3  . meloxicam (MOBIC) 7.5 MG tablet TAKE 1 TABLET BY MOUTH DAILY. 30 tablet 0  . methocarbamol (ROBAXIN-750) 750 MG tablet Take 1 tablet (750 mg total) by mouth 4 (four) times daily. 60 tablet 2  . mupirocin ointment (BACTROBAN) 2 % Apply to AA bid X 1 week 22 g 0  . traZODone (DESYREL) 100 MG tablet Take 1 tablet (100 mg total) by mouth at bedtime as needed for sleep. 30 tablet 3  . lisinopril (PRINIVIL,ZESTRIL) 20 MG tablet Take 1 tablet (20 mg total) by mouth daily. 30 tablet 2  . sertraline (ZOLOFT) 50 MG tablet Take 1 tablet (50 mg total) by mouth daily.  30 tablet 3  . atorvastatin (LIPITOR) 40 MG tablet Take 1 tablet (40 mg total) by mouth daily. (Patient not taking: Reported on 02/02/2017) 30 tablet 3  . cetirizine (ZYRTEC) 10 MG tablet Take 1 tablet (10 mg total) by mouth daily. (Patient not taking: Reported on 04/21/2017) 30 tablet 1  . chlorhexidine (HIBICLENS) 4 % external liquid Apply topically daily as needed. (Patient not taking: Reported on 04/21/2017) 120 mL 0  . oxybutynin (DITROPAN) 5 MG tablet Take 1 tablet (5 mg total) by mouth 2 (two) times daily. (Patient not taking: Reported on 04/21/2017) 60 tablet 3    . metroNIDAZOLE (METROGEL VAGINAL) 0.75 % vaginal gel Place 1 Applicatorful vaginally at bedtime. 70 g 3   No facility-administered medications prior to visit.     ROS Review of Systems  Constitutional: Positive for unexpected weight change. Negative for activity change, appetite change and fatigue.  HENT: Negative for congestion, sinus pressure and sore throat.   Eyes: Negative for visual disturbance.  Respiratory: Negative for cough, chest tightness, shortness of breath and wheezing.   Cardiovascular: Negative for chest pain and palpitations.  Gastrointestinal: Negative for abdominal distention, abdominal pain and constipation.  Endocrine: Negative for polydipsia.  Genitourinary: Negative for dysuria and frequency.  Musculoskeletal:       See hpi  Skin: Positive for rash.  Neurological: Positive for numbness. Negative for tremors and light-headedness.  Hematological: Bruises/bleeds easily.  Psychiatric/Behavioral: Negative for agitation and behavioral problems.    Objective:  BP 112/71 (BP Location: Left Arm, Patient Position: Sitting, Cuff Size: Large)   Pulse 76   Temp 98.9 F (37.2 C) (Oral)   Resp 18   Ht 5' (1.524 m)   Wt 185 lb (83.9 kg)   LMP 04/14/2017   SpO2 99%   BMI 36.13 kg/m   BP/Weight 05/14/2017 04/21/2017 2/33/0076  Systolic BP 226 333 545  Diastolic BP 71 68 83  Wt. (Lbs) 185 183 183.4  BMI 36.13 31.91 31.98      Physical Exam  Skin: Rash (Wart on the medial aspect of left index finger) noted.   Constitutional: She is oriented to person, place, and time. She appears well-developed and well-nourished.  HEENT:  Oropharynx with postnasal drip. Normal tympanic membranes bilaterally Cardiovascular: Normal rate, normal heart sounds and intact distal pulses.   No murmur heard. Pulmonary/Chest: Effort normal and breath sounds normal. She has no wheezes. She has no rales. She exhibits no tenderness.  Abdominal: Soft. Bowel sounds are normal. She exhibits  no distension and no mass. There is no tenderness.  Musculoskeletal: Tenderness on palpation of right thigh muscle; limited range of motion of both hips Normal appearance of both hand, no thenar or hyperthenar atrophic negative phalen and Tinel signs. Positive Neer Hawkins sign on the right upper extremity.  Neurological: She is alert and oriented to person, place, and time.  normal gait and coordination. Psychiatric: She has a normal mood and affect.   Lab Results  Component Value Date   HGBA1C 5.9 05/14/2017   CMP Latest Ref Rng & Units 10/28/2016 10/12/2016  Glucose 65 - 99 mg/dL 95 144(H)  BUN 7 - 25 mg/dL 13 9  Creatinine 0.50 - 1.10 mg/dL 0.64 0.64  Sodium 135 - 146 mmol/L 142 140  Potassium 3.5 - 5.3 mmol/L 4.5 4.1  Chloride 98 - 110 mmol/L 110 110  CO2 20 - 31 mmol/L 23 23  Calcium 8.6 - 10.2 mg/dL 9.1 8.6(L)  Total Protein 6.1 - 8.1 g/dL 6.6 -  Total Bilirubin 0.2 - 1.2 mg/dL 0.2 -  Alkaline Phos 33 - 115 U/L 75 -  AST 10 - 30 U/L 11 -  ALT 6 - 29 U/L 9 -    Lipid Panel     Component Value Date/Time   CHOL 198 10/28/2016 0851   TRIG 93 10/28/2016 0851   HDL 45 (L) 10/28/2016 0851   CHOLHDL 4.4 10/28/2016 0851    Assessment & Plan:   1. Diabetes mellitus without complication ([place]) UnControlled with A1c of 5.9 Encouraged to reduce portion sizes, increase physical activity, lifestyle modification to curb weight gain - POCT A1C - Glucose (CBG) - glucose blood (ACCU-CHEK AVIVA) test strip; Use as instructed daily  Dispense: 100 each; Refill: 12 - Blood Glucose Monitoring Suppl (ACCU-CHEK AVIVA) device; Use as instructed daily.  Dispense: 1 each; Refill: 0 - Lancet Devices (ACCU-CHEK SOFTCLIX) lancets; Use as instructed daily.  Dispense: 1 each; Refill: 5  2. Degenerative disc disease at L5-S1 Continue Gabapentin, Robaxin Cymbalta added to regimen - Ambulatory referral to Spine Surgery  3. 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 Essential hypertension Controlled  6. Anxiety and depression Switched from Zoloft to Cymbalta due to analgesic effect of the latter - DULoxetine (CYMBALTA) 60 MG capsule; Take 1 capsule (60 mg total) by mouth daily.  Dispense: 30 capsule; Refill: 3  7. Paresthesia - Ambulatory referral to Neurology  8. Wart Placed on Podocon - 25  Meds ordered this encounter  Medications  . glucose blood (ACCU-CHEK AVIVA) test strip    Sig: Use as instructed daily    Dispense:  100 each    Refill:  12  . Blood Glucose Monitoring Suppl (ACCU-CHEK AVIVA) device    Sig: Use as instructed daily.    Dispense:  1 each    Refill:  0  . Lancet Devices (ACCU-CHEK SOFTCLIX) lancets    Sig: Use as instructed daily.    Dispense:  1 each    Refill:  5  . DULoxetine (CYMBALTA) 60 MG capsule    Sig: Take 1 capsule (60 mg total) by mouth daily.    Dispense:  30 capsule    Refill:  3    Discontinue Zoloft    Follow-up: Return in about 3 months (around 08/14/2017) for Follow-up on chronic medical conditions.   This note has been created with Surveyor, quantity. Any transcriptional errors are unintentional.     Arnoldo Morale MD

## 2017-06-04 ENCOUNTER — Ambulatory Visit (HOSPITAL_COMMUNITY): Payer: Self-pay | Admitting: Psychiatry

## 2017-06-17 ENCOUNTER — Telehealth: Payer: Self-pay | Admitting: Licensed Clinical Social Worker

## 2017-06-17 ENCOUNTER — Other Ambulatory Visit: Payer: Self-pay | Admitting: Family Medicine

## 2017-06-17 DIAGNOSIS — E1100 Type 2 diabetes mellitus with hyperosmolarity without nonketotic hyperglycemic-hyperosmolar coma (NKHHC): Secondary | ICD-10-CM

## 2017-06-17 NOTE — Telephone Encounter (Signed)
LCSWA attempted to contact pt to follow up on behavioral health. A HIPPA compliant message was left requesting a return call.

## 2017-06-23 ENCOUNTER — Telehealth: Payer: Self-pay | Admitting: Licensed Clinical Social Worker

## 2017-06-23 NOTE — Telephone Encounter (Signed)
LCSWA contacted pt via telephone. LCSWA introduced self and explained role at [facility] disclosed that she received a consult from Dr. Jarold Song to address behavioral health needs.  Pt scheduled an appointment with [facility] for Friday, August 31 to complete behavioral health assessment.

## 2017-06-25 ENCOUNTER — Ambulatory Visit: Payer: Medicaid Other | Attending: Family Medicine | Admitting: Licensed Clinical Social Worker

## 2017-06-25 DIAGNOSIS — F329 Major depressive disorder, single episode, unspecified: Secondary | ICD-10-CM

## 2017-06-25 DIAGNOSIS — F419 Anxiety disorder, unspecified: Secondary | ICD-10-CM

## 2017-06-25 DIAGNOSIS — F32A Anxiety disorder, unspecified: Secondary | ICD-10-CM

## 2017-06-25 NOTE — BH Specialist Note (Signed)
Integrated Behavioral Health Initial Visit  MRN: [id number] Name: Amber Rose   Session Start time: 3:35 PM Session End time: 4:05 PM Total time: 30 minutes  Type of Service: [place] Interpretor:No. Interpretor Name and Language: N/A   Warm Hand Off Completed.       SUBJECTIVE: Amber Rose is a 41 y.o. female accompanied by patient. Patient was referred by Dr. Jarold Song for depression and anxiety. Patient reports the following symptoms/concerns: feelings of sadness and worry, difficulty sleeping, low energy, difficulty concentrating, racing thoughts, chronic pain, panic attacks, and hx of suicidal ideations Duration of problem: "years"; Severity of problem: moderate  OBJECTIVE: Mood: Depressed and Affect: Appropriate Risk of harm to self or others: No plan to harm self or others   LIFE CONTEXT: Family and Social: Pt receives support from her adult niece and her children (39, 57, and 92 yo) She has strained relationship with mother, who resides in [place], [place] School/Work: Pt has applied for disability and has an upcoming hearing scheduled in October. She receives child support and financial assistance from family Self-Care: Pt smokes cigarettes (1 ppd) Life Changes: Pt has ongoing medical concerns that result in chronic pain  GOALS ADDRESSED: Patient will reduce symptoms of: anxiety and depression and increase knowledge and/or ability of: coping skills and also: Increase adequate support systems for patient/family   INTERVENTIONS: Solution-Focused Strategies, Supportive Counseling, Psychoeducation and/or Health Education and Link to [employer]  Standardized Assessments completed: Pt was not provided screenings  ASSESSMENT: Patient currently experiencing depression and anxiety triggered by ongoing medical concerns. She reports feelings of sadness and worry, difficulty sleeping, low energy, difficulty concentrating,  racing thoughts, chronic pain, panic attacks, and hx of suicidal ideations. Pt denies current SI/HI/AVH. She receives emotional and financial support from family. LCSWA educated pt on the correlation between one's physical and mental health. LCSWA discussed benefits of applying healthy coping skills to decrease symptoms. Pt is participating in psychotherapy through [facility] and medication management with PCP. Pt reported that she is having difficulty managing medications due to confusion. LCSWA strongly encouraged pt to schedule appointment with CPP, Theda Sers. Pt was provided resources for crisis intervention, psychotherapy, and medication management to assist with ADD medications. LCSWA will consult with PCP to assess whether referrals to [facility] and [facility] are appropriate.   PLAN: 1. Follow up with behavioral health clinician on : Pt was encouraged to contact LCSWA if symptoms worsen or fail to improve to schedule behavioral appointments at [facility]. 2. Behavioral recommendations: LCSWA recommends that pt apply healthy coping skills discussed, comply with medication management, and utilize provided resources. Pt is encouraged to schedule follow up appointment with LCSWA 3. Referral(s): [facility] (In Clinic), [facility] (LME/Outside Clinic) and Psychiatrist 4. "From scale of 1-10, how likely are you to follow plan?": 9/10  Rebekah Chesterfield, LCSW 06/29/17 4:56 PM

## 2017-07-06 ENCOUNTER — Ambulatory Visit: Payer: Medicaid Other | Attending: Family Medicine | Admitting: Pharmacist

## 2017-07-06 DIAGNOSIS — R609 Edema, unspecified: Secondary | ICD-10-CM | POA: Insufficient documentation

## 2017-07-06 DIAGNOSIS — Z79899 Other long term (current) drug therapy: Secondary | ICD-10-CM | POA: Diagnosis not present

## 2017-07-06 DIAGNOSIS — F419 Anxiety disorder, unspecified: Secondary | ICD-10-CM | POA: Insufficient documentation

## 2017-07-06 DIAGNOSIS — E119 Type 2 diabetes mellitus without complications: Secondary | ICD-10-CM | POA: Insufficient documentation

## 2017-07-06 NOTE — Patient Instructions (Addendum)
Thanks for coming to see Amber Rose  Take the Cymbalta - the sertraline was stopped.  The furosemide was stopped in June so if you feel like you need this, follow up with Dr. Linton Ham can try the oxybutynin to see if it helps with the incontinence. Continue to drink water while you are awake but it is ok if you are asleep and unable to drink water.  Follow up with Dr. Jarold Song next week

## 2017-07-06 NOTE — Progress Notes (Signed)
S:    Patient presents for medication management. She was referred by Christa See, LCSW, due to confusion over medications.   Patient denies adherence with medications. She reports that she only consistently takes her blood pressure medications, her diabetes medication, and her acyclovir.   She reports that she stopped her gabapentin because she didn't feel like it was helping. She has not started the Cymbalta because she didn't know if she was supposed to be Cymbalta or sertraline though she thought Dr. Jarold Song said Cymbalta. She did not start the oxybutynin because she believes it will dehydrate her and she cannot be dehydrated if she has diabetes. She does not take allergy medications because she feels that she does not have allergies.  She reports continued swelling in her face and she doesn't understand what could be causing it.   Medication Adherence Questionnaire (A score of 2 or more points indicates risk for nonadherence)  Do you know what each of your medicines is for? Yes (1 point if no)  Do you ever have trouble remembering to take your medicine? No  (2 points if yes)  Do you ever not take a medicine because you feel you do not need it?  Yes (1 point if yes)  Do you think that any of your medicines is not helping you? Yes (1 point if yes)  Do you have any physical problems such as vision loss that keep you from taking your medicines as prescribed?   No (2 points if yes)  Do you think any of your medicine is causing a side effect? Yes (1 point if yes)  Do you know the names of ALL of your medicines? Yes (1 point if no)  Do you think that you need ALL of your medicines? No(1 point if no)  In the past 6 months, have you missed getting a refill or a new prescription filled on time?  Yes(1 point if yes)  How often do you miss taking a dose of medicine? 2 or more times a week.  Never (0 points), 1 or 2 times a month (0 points), 1 time a week (2 points), 2 or more times a week (2 points).   TOTAL SCORE  7    O:    A/P: 1. Medication Adherence: Patient has known adherence challenges (perceived lack of benefit and anxiety related to side effects and drug-drug interactions). Barriers include: lack of belief in necessity of treatment, concern for more harm than good. Instructed patient to follow up with Dr. Jarold Song concerning her medications but that she needs to tries the medications prescribed by Dr. Jarold Song before assuming that they are not going to work.   2. Medication Reconciliation: medication list reviewed and updated. The following issues were noted - patient not taking Cymbalta, gabapentin, or oxybutynin at all. Intermittent compliance with other medications. Patient was provided with a printed medication list.   3. Medication reactions: patient with swelling in her face. Though lisinopril can cause facial swelling, this is not a typical reaction. Could consider trial off to see if it improves. Encouraged patient to stay hydrated and drink water throughout the day but that she does not need to not take the medications prescribed by Dr. Jarold Song due to fear over dehydration. Dr. Jarold Song will monitor her for dehydration and can adjust her medications if she feels that it is needed. Instructed patient to use caution when taking her medications that are sedative. She should not drive or do any activities that require alertness when  taking these medications.     Overall, reviewed medications with patient and instructed her to take her medications as prescribed and to consult Dr. Jarold Song if she does have any adverse reactions.   Written patient instructions provided.  Total time in face to face counseling 20 minutes.   Follow up with Dr. Jarold Song next week.   Patient seen with Waverly Ferrari, PharmD Candidate

## 2017-07-07 ENCOUNTER — Ambulatory Visit (INDEPENDENT_AMBULATORY_CARE_PROVIDER_SITE_OTHER): Payer: Medicaid Other | Admitting: Diagnostic Neuroimaging

## 2017-07-07 ENCOUNTER — Encounter: Payer: Self-pay | Admitting: Diagnostic Neuroimaging

## 2017-07-07 VITALS — BP 137/95 | HR 78 | Ht 60.0 in | Wt 191.0 lb

## 2017-07-07 DIAGNOSIS — M791 Myalgia, unspecified site: Secondary | ICD-10-CM

## 2017-07-07 DIAGNOSIS — R200 Anesthesia of skin: Secondary | ICD-10-CM

## 2017-07-07 DIAGNOSIS — M2550 Pain in unspecified joint: Secondary | ICD-10-CM | POA: Diagnosis not present

## 2017-07-07 DIAGNOSIS — R202 Paresthesia of skin: Secondary | ICD-10-CM | POA: Diagnosis not present

## 2017-07-07 DIAGNOSIS — R413 Other amnesia: Secondary | ICD-10-CM

## 2017-07-07 DIAGNOSIS — R220 Localized swelling, mass and lump, head: Secondary | ICD-10-CM

## 2017-07-07 NOTE — Progress Notes (Signed)
[facility]  PATIENT: Amber Rose DOB: 1976/03/29  REFERRING CLINICIAN: Raquel James, MD  HISTORY FROM: patient  REASON FOR VISIT: new consult    HISTORICAL  CHIEF COMPLAINT:  Chief Complaint  Patient presents with  . NP AMAO  . Numbness    Pt here for headaches, facila swelling, R>L LE numbness/pain, brain fog. Pharmacist, went over her meds yesterday    HISTORY OF PRESENT ILLNESS:   41 year old female here for evaluation of combination of symptoms including headaches, facial swelling, pain, brain fog, gait difficulty, swallowing difficulty, numbness and pain.  In 2009 patient had diagnosis of diabetes. Her hemoglobin A1c ranged from 8-9 at that time. She was also having intermittent numbness and tingling in her fingers, arms, legs, typically when she placed pressure on her extremities. Over time numbness and tingling became more constant.   In 2010 patient had viral meningitis and was hospitalized for several weeks. Also around the same time she was also diagnosed with gallstone pancreatitis treated with surgery.  In 2011 patient started to have unexplained neck and joint pain. She was evaluated by rheumatology who found no specific cause for symptoms. Patient did have history of positive lupus anticoagulant test but confirmatory testing was negative. She had a positive ANA as well but no specific autoimmune condition was found.  Patient also has had intermittent, position dependent facial swelling. When patient lays down for 30 or 45 minutes, she will develop facial swelling in her eyelids, face and cheeks. This seems to improve if she stands up or sits up. She has been tested for numerous allergies which have been negative.  Patient also have decreased strength in her right arm and right leg. She feels decreased sensation in the right side.  Patient has had urinary tract infections and incontinence.  Patient has family history of multiple sclerosis in  her paternal grandmother. Patient was concerned about similar diagnosis and PCP ordered MRI of the cervical, thoracic and lumbar spine which were negative. Patient has not had MRI of the brain yet.  Patient previously lived in [place] and more recently moved to [place]. I was able to review notes through care everywhere in [facility].   REVIEW OF SYSTEMS: Full 14 system review of systems performed and negative with exception of: Fevers chills weight gain fatigue I pain easy bruising memory loss confusion headache numbness difficult swallowing dizziness sleepiness restless legs depression anxiety suicidal thoughts disinterest after his racing thoughts consistently incontinence rash trouble swallowing.  ALLERGIES: Allergies  Allergen Reactions  . Metformin And Related Nausea And Vomiting  . Penicillins   . Xanax [Alprazolam]   . Glyburide Other (See Comments)    "blood sugar dropped uncontrollably"  . Linagliptin Diarrhea and Other (See Comments)    Stomach pain, sinus infection  . Moxifloxacin Diarrhea    HOME MEDICATIONS: Outpatient Medications Prior to Visit  Medication Sig Dispense Refill  . acetaminophen-codeine (TYLENOL #3) 300-30 MG tablet Take 1 tablet by mouth every 12 (twelve) hours as needed for moderate pain. 60 tablet 0  . acyclovir (ZOVIRAX) 400 MG tablet TAKE 1 TABLET BY MOUTH TWICE A DAY 60 tablet 0  . amLODipine (NORVASC) 10 MG tablet Take 1 tablet (10 mg total) by mouth daily. 90 tablet 3  . Blood Glucose Monitoring Suppl (ACCU-CHEK AVIVA) device Use as instructed daily. 1 each 0  . diazepam (VALIUM) 5 MG tablet Take 5 mg by mouth every 6 (six) hours as needed for anxiety.    . Exenatide ER (BYDUREON)  2 MG PEN INJECT 2 MG INTO THE SKIN ONCE A WEEK. 12 each 3  . glucose blood (ACCU-CHEK AVIVA) test strip Use as instructed daily 100 each 12  . Lancet Devices (ACCU-CHEK SOFTCLIX) lancets Use as instructed daily. 1 each 5  . lisinopril (PRINIVIL,ZESTRIL) 20 MG tablet TAKE  1 TABLET BY MOUTH DAILY. 30 tablet 2  . meloxicam (MOBIC) 7.5 MG tablet TAKE 1 TABLET BY MOUTH DAILY. 30 tablet 0  . methocarbamol (ROBAXIN-750) 750 MG tablet Take 1 tablet (750 mg total) by mouth 4 (four) times daily. 60 tablet 2  . traZODone (DESYREL) 100 MG tablet Take 1 tablet (100 mg total) by mouth at bedtime as needed for sleep. 30 tablet 3  . atorvastatin (LIPITOR) 40 MG tablet Take 1 tablet (40 mg total) by mouth daily. (Patient not taking: Reported on 02/02/2017) 30 tablet 3  . cetirizine (ZYRTEC) 10 MG tablet Take 1 tablet (10 mg total) by mouth daily. (Patient not taking: Reported on 04/21/2017) 30 tablet 1  . chlorhexidine (HIBICLENS) 4 % external liquid Apply topically daily as needed. (Patient not taking: Reported on 04/21/2017) 120 mL 0  . DULoxetine (CYMBALTA) 60 MG capsule Take 1 capsule (60 mg total) by mouth daily. (Patient not taking: Reported on 07/06/2017) 30 capsule 3  . gabapentin (NEURONTIN) 300 MG capsule Take 1 capsule (300 mg total) by mouth 2 (two) times daily. (Patient not taking: Reported on 07/06/2017) 60 capsule 3  . mupirocin ointment (BACTROBAN) 2 % Apply to AA bid X 1 week (Patient not taking: Reported on 07/06/2017) 22 g 0  . oxybutynin (DITROPAN) 5 MG tablet Take 1 tablet (5 mg total) by mouth 2 (two) times daily. (Patient not taking: Reported on 04/21/2017) 60 tablet 3  . podophyllum resin (PODOCON-25) 25 % SOLN Apply 1 mL topically once a week. To wart on L index finger (Patient not taking: Reported on 07/06/2017) 15 mL 0   No facility-administered medications prior to visit.     PAST MEDICAL HISTORY: Past Medical History:  Diagnosis Date  . Anxiety   . Arthritis   . Chronic UTI   . Depression   . Diabetes mellitus without complication ([place])   . Fibromyalgia   . Hypertension   . Meningitis, viral   . Pancreatitis     PAST SURGICAL HISTORY: Past Surgical History:  Procedure Laterality Date  . APPENDECTOMY  1990  . ceasarian  2002  . CHOLECYSTECTOMY   2011  . URINARY SURGERY     urethra sling, removal, and then revision 2016 (4 surgeries)    FAMILY HISTORY: Family History  Problem Relation Age of Onset  . Cancer Mother        cervical, breast, lungm skin  . Diabetes Father   . Multiple sclerosis Paternal Grandmother     SOCIAL HISTORY:  Social History   Social History  . Marital status: Single    Spouse name: N/A  . Number of children: N/A  . Years of education: N/A   Occupational History  . Not on file.   Social History Main Topics  . Smoking status: Current Every Day Smoker    Packs/day: 1.00    Years: 20.00    Types: Cigarettes  . Smokeless tobacco: Never Used  . Alcohol use Yes     Comment: socially  . Drug use: No     Comment: last week hx 07-07-17  . Sexual activity: Not on file   Other Topics Concern  . Not on file  Social History Narrative   Lives home with adult niece.  Not working.  Disability pending.  Pt is single.  Education GED.  3 children.      PHYSICAL EXAM  GENERAL EXAM/CONSTITUTIONAL: Vitals:  Vitals:   07/07/17 1042  BP: (!) 137/95  Pulse: 78  Weight: 191 lb (86.6 kg)  Height: 5' (1.524 m)     Body mass index is 37.3 kg/m.  Visual Acuity Screening   Right eye Left eye Both eyes  Without correction: 20/40 20/50   With correction:        Patient is in no distress; well developed, nourished and groomed; neck is supple  CARDIOVASCULAR:  Examination of carotid arteries is normal; no carotid bruits  Regular rate and rhythm, no murmurs  Examination of peripheral vascular system by observation and palpation is normal  EYES:  Ophthalmoscopic exam of optic discs and posterior segments is normal; no papilledema or hemorrhages  MUSCULOSKELETAL:  Gait, strength, tone, movements noted in Neurologic exam below  NEUROLOGIC: MENTAL STATUS:  No flowsheet data found.  awake, alert, oriented to person, place and time  recent and remote memory intact  normal attention  and concentration  language fluent, comprehension intact, naming intact,   fund of knowledge appropriate  CRANIAL NERVE:   2nd - no papilledema on fundoscopic exam  2nd, 3rd, 4th, 6th - pupils equal and reactive to light, visual fields full to confrontation, extraocular muscles intact, no nystagmus  5th - facial sensation symmetric  7th - facial strength symmetric  8th - hearing intact  9th - palate elevates symmetrically, uvula midline  11th - shoulder shrug symmetric  12th - tongue protrusion midline  MOTOR:   normal bulk and tone, full strength in the BUE, BLE  EXCEPT DECR IN RIGHT SHOULDER AND RIGHT HIP FLEXION  SENSORY:   normal and symmetric to light touch; DECR TEMP IN RIGHT HAND AND FOOT  COORDINATION:   finger-nose-finger, fine finger movements --> MILD POSTURAL / ACTION TREMOR  REFLEXES:   deep tendon reflexes present and symmetric; TRACE AT ANKLES  GAIT/STATION:   narrow based gait; romberg is negative    DIAGNOSTIC DATA (LABS, IMAGING, TESTING) - I reviewed patient records, labs, notes, testing and imaging myself where available.  Lab Results  Component Value Date   WBC 9.4 10/12/2016   HGB 11.5 (L) 10/12/2016   HCT 34.7 (L) 10/12/2016   MCV 84.6 10/12/2016   PLT 279 10/12/2016      Component Value Date/Time   NA 142 10/28/2016 0851   K 4.5 10/28/2016 0851   CL 110 10/28/2016 0851   CO2 23 10/28/2016 0851   GLUCOSE 95 10/28/2016 0851   BUN 13 10/28/2016 0851   CREATININE 0.64 10/28/2016 0851   CALCIUM 9.1 10/28/2016 0851   PROT 6.6 10/28/2016 0851   ALBUMIN 4.0 10/28/2016 0851   AST 11 10/28/2016 0851   ALT 9 10/28/2016 0851   ALKPHOS 75 10/28/2016 0851   BILITOT 0.2 10/28/2016 0851   GFRNONAA >89 10/28/2016 0851   GFRAA >89 10/28/2016 0851   Lab Results  Component Value Date   CHOL 198 10/28/2016   HDL 45 (L) 10/28/2016   TRIG 93 10/28/2016   CHOLHDL 4.4 10/28/2016   Lab Results  Component Value Date   HGBA1C 5.9  05/14/2017   No results found for: [id number] Lab Results  Component Value Date   TSH 1.28 12/25/2016     03/05/17 carotid u/s - Bilateral - 1% to 39% ICA senosis. vertebral  artery flow is antegrade.  03/05/17 MRI cervical / thoracic / lumbar spine (without) [I reviewed images myself and agree with interpretation. -VRP]  - Negative for cord abnormality. Negative for cord compression, cord lesion, or demyelinating disease. - Degenerative changes in the cervical, thoracic, and lumbar spine as described above.     ASSESSMENT AND PLAN  41 y.o. year old female here with constellation of symptoms including headaches, unexplained facial swelling, chronic pain, numbness and tingling on the right side, weakness in the right side, gait difficulty, swallowing difficulty brain fog, memory loss, depression and anxiety. Symptoms started around 2008 but have been progressive over the past 10 years.   H/o lupus anticoagulant + test x 1; follow up testing negative.  H/o positive ANA, but no specific autoimmune condition diagnosed.    Dx:  1. Numbness and tingling   2. Arthralgia, unspecified joint   3. Muscle pain   4. Memory loss   5. Facial swelling      PLAN:  NUMBNESS / PAIN / SWELLING / BRUISING - MRI brain - lab testing  FACIAL SWELLING - unclear etiology; consider allergy / immunology referral  DECONDITIONING / PAIN - encouraged improvements in nutrition, fitness and stress mgmt  Orders Placed This Encounter  Procedures  . MR BRAIN W WO CONTRAST  . Angiotensin converting enzyme  . Pan-ANCA  . Multiple Myeloma Panel (SPEP&IFE w/QIG)  . CK  . Aldolase   Return in about 3 months (around 10/06/2017).  I reviewed images, labs, notes, records myself. I summarized findings and reviewed with patient, for this high risk condition (numbness, weakness, possible autoimmune condition) requiring high complexity decision making.     Penni Bombard, MD 8/73/7308, 16:83  AM Certified in Neurology, Neurophysiology and Neuroimaging  [facility] [address] [place], [place] [address] [phone]

## 2017-07-07 NOTE — Patient Instructions (Signed)
Thank you for coming to see [place] at [facility]. I hope we have been able to provide you high quality care today.  You may receive a patient satisfaction survey over the next few weeks. We would appreciate your feedback and comments so that we may continue to improve ourselves and the health of our patients.  NUMBNESS / PAIN / SWELLING / BRUISING - MRI brain - lab testing  FACIAL SWELLING - consider allergy / immunology referral  DECONDITIONING / PAIN - encouraged improvements in nutrition, fitness and stress mgmt   ~~~~~~~~~~~~~~~~~~~~~~~~~~~~~~~~~~~~~~~~~~~~~~~~~~~~~~~~~~~~~~~~~  DR. Journiee Feldkamp'S GUIDE TO HAPPY AND HEALTHY LIVING These are some of my general health and wellness recommendations. Some of them may apply to you better than others. Please use common sense as you try these suggestions and feel free to ask me any questions.   ACTIVITY/FITNESS Mental, social, emotional and physical stimulation are very important for brain and body health. Try learning a new activity (arts, music, language, sports, games).  Keep moving your body to the best of your abilities. You can do this at home, inside or outside, the park, community center, gym or anywhere you like. Consider a physical therapist or personal trainer to get started. Consider the app Sworkit. Fitness trackers such as smart-watches, smart-phones or Fitbits can help as well.   NUTRITION Eat more plants: colorful vegetables, nuts, seeds and berries.  Eat less sugar, salt, preservatives and processed foods.  Avoid toxins such as cigarettes and alcohol.  Drink water when you are thirsty. Warm water with a slice of lemon is an excellent morning drink to start the day.  Consider these websites for more information The Nutrition Source (https://www.henry-hernandez.biz/) [employer] Nutrition (WindowBlog.ch)   RELAXATION Consider practicing mindfulness meditation  or other relaxation techniques such as deep breathing, prayer, yoga, tai chi, massage. See website mindful.org or the apps Headspace or Calm to help get started.   SLEEP Try to get at least 7-8+ hours sleep per day. Regular exercise and reduced caffeine will help you sleep better. Practice good sleep hygeine techniques. See website sleep.org for more information.   PLANNING Prepare estate planning, living will, healthcare POA documents. Sometimes this is best planned with the help of an attorney. Theconversationproject.org and agingwithdignity.org are excellent resources.

## 2017-07-09 LAB — MULTIPLE MYELOMA PANEL, SERUM
Albumin SerPl Elph-Mcnc: 3.6 g/dL (ref 2.9–4.4)
Albumin/Glob SerPl: 1.1 (ref 0.7–1.7)
Alpha 1: 0.3 g/dL (ref 0.0–0.4)
Alpha2 Glob SerPl Elph-Mcnc: 0.8 g/dL (ref 0.4–1.0)
B-Globulin SerPl Elph-Mcnc: 1.4 g/dL — ABNORMAL HIGH (ref 0.7–1.3)
Gamma Glob SerPl Elph-Mcnc: 0.9 g/dL (ref 0.4–1.8)
Globulin, Total: 3.4 g/dL (ref 2.2–3.9)
IgA/Immunoglobulin A, Serum: 412 mg/dL — ABNORMAL HIGH (ref 87–352)
IgG (Immunoglobin G), Serum: 996 mg/dL (ref 700–1600)
IgM (Immunoglobulin M), Srm: 59 mg/dL (ref 26–217)
Total Protein: 7 g/dL (ref 6.0–8.5)

## 2017-07-09 LAB — ANGIOTENSIN CONVERTING ENZYME: Angio Convert Enzyme: <15 U/L (ref 14–82)

## 2017-07-09 LAB — PAN-ANCA
ANCA Proteinase 3: <3.5 U/mL (ref 0.0–3.5)
Atypical pANCA: <1:20 {titer}
C-ANCA: <1:20 {titer}
Myeloperoxidase Ab: <9 U/mL (ref 0.0–9.0)
P-ANCA: <1:20 {titer}

## 2017-07-09 LAB — CK: Total CK: 66 U/L (ref 24–173)

## 2017-07-09 LAB — ALDOLASE: Aldolase: 4.5 U/L (ref 3.3–10.3)

## 2017-07-12 ENCOUNTER — Encounter: Payer: Self-pay | Admitting: Family Medicine

## 2017-07-12 ENCOUNTER — Telehealth: Payer: Self-pay | Admitting: Diagnostic Neuroimaging

## 2017-07-12 ENCOUNTER — Ambulatory Visit: Payer: Self-pay | Admitting: Family Medicine

## 2017-07-12 ENCOUNTER — Ambulatory Visit: Payer: Medicaid Other | Attending: Family Medicine | Admitting: Family Medicine

## 2017-07-12 VITALS — BP 116/74 | HR 73 | Temp 98.0°F | Ht 60.0 in | Wt 194.8 lb

## 2017-07-12 DIAGNOSIS — Z880 Allergy status to penicillin: Secondary | ICD-10-CM | POA: Insufficient documentation

## 2017-07-12 DIAGNOSIS — F329 Major depressive disorder, single episode, unspecified: Secondary | ICD-10-CM | POA: Insufficient documentation

## 2017-07-12 DIAGNOSIS — K859 Acute pancreatitis without necrosis or infection, unspecified: Secondary | ICD-10-CM | POA: Insufficient documentation

## 2017-07-12 DIAGNOSIS — R51 Headache: Secondary | ICD-10-CM | POA: Insufficient documentation

## 2017-07-12 DIAGNOSIS — Z888 Allergy status to other drugs, medicaments and biological substances status: Secondary | ICD-10-CM | POA: Diagnosis not present

## 2017-07-12 DIAGNOSIS — F419 Anxiety disorder, unspecified: Secondary | ICD-10-CM | POA: Diagnosis not present

## 2017-07-12 DIAGNOSIS — Z9889 Other specified postprocedural states: Secondary | ICD-10-CM | POA: Diagnosis not present

## 2017-07-12 DIAGNOSIS — R220 Localized swelling, mass and lump, head: Secondary | ICD-10-CM | POA: Diagnosis not present

## 2017-07-12 DIAGNOSIS — R600 Localized edema: Secondary | ICD-10-CM

## 2017-07-12 DIAGNOSIS — Z8661 Personal history of infections of the central nervous system: Secondary | ICD-10-CM | POA: Insufficient documentation

## 2017-07-12 DIAGNOSIS — Z79899 Other long term (current) drug therapy: Secondary | ICD-10-CM | POA: Insufficient documentation

## 2017-07-12 DIAGNOSIS — K219 Gastro-esophageal reflux disease without esophagitis: Secondary | ICD-10-CM | POA: Diagnosis not present

## 2017-07-12 DIAGNOSIS — I10 Essential (primary) hypertension: Secondary | ICD-10-CM | POA: Diagnosis not present

## 2017-07-12 DIAGNOSIS — M797 Fibromyalgia: Secondary | ICD-10-CM | POA: Insufficient documentation

## 2017-07-12 DIAGNOSIS — Z9049 Acquired absence of other specified parts of digestive tract: Secondary | ICD-10-CM | POA: Insufficient documentation

## 2017-07-12 DIAGNOSIS — R21 Rash and other nonspecific skin eruption: Secondary | ICD-10-CM | POA: Diagnosis not present

## 2017-07-12 DIAGNOSIS — E119 Type 2 diabetes mellitus without complications: Secondary | ICD-10-CM | POA: Insufficient documentation

## 2017-07-12 LAB — GLUCOSE, POCT (MANUAL RESULT ENTRY): POC Glucose: 108 mg/dl — AB (ref 70–99)

## 2017-07-12 MED ORDER — OMEPRAZOLE 20 MG PO CPDR: 20.0000 mg | DELAYED_RELEASE_CAPSULE | Freq: Every day | ORAL | 3 refills | Status: DC

## 2017-07-12 NOTE — Telephone Encounter (Signed)
Patient calling to get lab results.

## 2017-07-12 NOTE — Progress Notes (Signed)
Subjective:  Patient ID: Amber Rose, female    DOB: [birth year]  Age: 41 y.o. MRN: [id number]  CC: Diabetes and Medication Management   HPI Amber Rose is a 41 year old female with type 2 diabetes mellitus (A1c 5.9), hypertension, depression and anxiety, hydradenitis, tobacco abuse, fibromyalgia here for an acute visit complaining of two non pruritic anterior chest wall rash which she noticed a month ago and has applied topical antibiotics with no relief.  Seen by Neurology last week due to complaints of facial flushing, facial edema, intermittent numbness, tingling , memory loss and labs ordered which revealed slightly elevated Ig A, multiple myeloma panel was neg, MRI brain pending.  She continues to complain of persisting symptoms but thigh pains and bruising have been absent as she has not been as active. Facial swelling and flushing occur with periorbital edema, headaches and are more noticed with her periods.  Past Medical History:  Diagnosis Date  . Anxiety   . Arthritis   . Chronic UTI   . Depression   . Diabetes mellitus without complication ([place])   . Fibromyalgia   . Hypertension   . Meningitis, viral   . Pancreatitis     Past Surgical History:  Procedure Laterality Date  . APPENDECTOMY  1990  . ceasarian  2002  . CHOLECYSTECTOMY  2011  . URINARY SURGERY     urethra sling, removal, and then revision 2016 (4 surgeries)    Allergies  Allergen Reactions  . Metformin And Related Nausea And Vomiting  . Penicillins   . Xanax [Alprazolam]   . Glyburide Other (See Comments)    "blood sugar dropped uncontrollably"  . Linagliptin Diarrhea and Other (See Comments)    Stomach pain, sinus infection  . Moxifloxacin Diarrhea     Outpatient Medications Prior to Visit  Medication Sig Dispense Refill  . acyclovir (ZOVIRAX) 400 MG tablet TAKE 1 TABLET BY MOUTH TWICE A DAY 60 tablet 0  . amLODipine (NORVASC) 10 MG tablet Take 1 tablet (10 mg total) by mouth  daily. 90 tablet 3  . Blood Glucose Monitoring Suppl (ACCU-CHEK AVIVA) device Use as instructed daily. 1 each 0  . diazepam (VALIUM) 5 MG tablet Take 5 mg by mouth every 6 (six) hours as needed for anxiety.    . DULoxetine (CYMBALTA) 60 MG capsule Take 1 capsule (60 mg total) by mouth daily. 30 capsule 3  . Exenatide ER (BYDUREON) 2 MG PEN INJECT 2 MG INTO THE SKIN ONCE A WEEK. 12 each 3  . glucose blood (ACCU-CHEK AVIVA) test strip Use as instructed daily 100 each 12  . Lancet Devices (ACCU-CHEK SOFTCLIX) lancets Use as instructed daily. 1 each 5  . lisinopril (PRINIVIL,ZESTRIL) 20 MG tablet TAKE 1 TABLET BY MOUTH DAILY. 30 tablet 2  . meloxicam (MOBIC) 7.5 MG tablet TAKE 1 TABLET BY MOUTH DAILY. 30 tablet 0  . methocarbamol (ROBAXIN-750) 750 MG tablet Take 1 tablet (750 mg total) by mouth 4 (four) times daily. 60 tablet 2  . oxybutynin (DITROPAN) 5 MG tablet Take 1 tablet (5 mg total) by mouth 2 (two) times daily. 60 tablet 3  . traZODone (DESYREL) 100 MG tablet Take 1 tablet (100 mg total) by mouth at bedtime as needed for sleep. 30 tablet 3  . atorvastatin (LIPITOR) 40 MG tablet Take 1 tablet (40 mg total) by mouth daily. (Patient not taking: Reported on 02/02/2017) 30 tablet 3  . cetirizine (ZYRTEC) 10 MG tablet Take 1 tablet (10 mg total) by mouth daily. (Patient  not taking: Reported on 04/21/2017) 30 tablet 1  . chlorhexidine (HIBICLENS) 4 % external liquid Apply topically daily as needed. (Patient not taking: Reported on 04/21/2017) 120 mL 0  . mupirocin ointment (BACTROBAN) 2 % Apply to AA bid X 1 week (Patient not taking: Reported on 07/06/2017) 22 g 0  . podophyllum resin (PODOCON-25) 25 % SOLN Apply 1 mL topically once a week. To wart on L index finger (Patient not taking: Reported on 07/06/2017) 15 mL 0  . acetaminophen-codeine (TYLENOL #3) 300-30 MG tablet Take 1 tablet by mouth every 12 (twelve) hours as needed for moderate pain. (Patient not taking: Reported on 07/12/2017) 60 tablet 0  .  gabapentin (NEURONTIN) 300 MG capsule Take 1 capsule (300 mg total) by mouth 2 (two) times daily. (Patient not taking: Reported on 07/06/2017) 60 capsule 3   No facility-administered medications prior to visit.     ROS Review of Systems  Constitutional: Negative for activity change, appetite change and fatigue.  HENT: Negative for congestion, sinus pressure and sore throat.   Eyes: Negative for visual disturbance.  Respiratory: Negative for cough, chest tightness, shortness of breath and wheezing.   Cardiovascular: Negative for chest pain and palpitations.  Gastrointestinal: Negative for abdominal distention, abdominal pain and constipation.  Endocrine: Negative for polydipsia.  Genitourinary: Negative for dysuria and frequency.  Musculoskeletal: Positive for myalgias. Negative for arthralgias and back pain.  Skin: Positive for rash.  Neurological: Negative for tremors, light-headedness and numbness.  Hematological: Does not bruise/bleed easily.  Psychiatric/Behavioral: Negative for agitation and behavioral problems.    Objective:  BP 116/74   Pulse 73   Temp 98 F (36.7 C) (Oral)   Ht 5' (1.524 m)   Wt 194 lb 12.8 oz (88.4 kg)   SpO2 99%   BMI 38.04 kg/m   BP/Weight 07/12/2017 07/07/2017 1/68/3729  Systolic BP 021 115 520  Diastolic BP 74 95 71  Wt. (Lbs) 194.8 191 185  BMI 38.04 37.3 36.13      Physical Exam  Constitutional: She is oriented to person, place, and time. She appears well-developed and well-nourished.  Cardiovascular: Normal rate, normal heart sounds and intact distal pulses.   No murmur heard. Pulmonary/Chest: Effort normal and breath sounds normal. She has no wheezes. She has no rales. She exhibits no tenderness.  Abdominal: Soft. Bowel sounds are normal. She exhibits no distension and no mass. There is no tenderness.  Neurological: She is alert and oriented to person, place, and time.  Skin:  Two cyst like lesions on anterior chest wall which is not  tender     Lab Results  Component Value Date   HGBA1C 5.9 05/14/2017    Assessment & Plan:   1. Diabetes mellitus without complication ([place]) Controlled with A1c of 5.9 Diabetic diet, continue Bydureon - POCT glucose (manual entry)  2. Fibromyalgia Discontinued Gabapentin due to intolerance Continue Cymbalta and Robaxin  3. Facial edema Unknown etiology, work up so far unrevealing - Ambulatory referral to Allergy  4. GERD without esophagitis Commence Omeprazole  5. Non speciific rash Suspicious for hardened cysts. Reassurance   Meds ordered this encounter  Medications  . omeprazole (PRILOSEC) 20 MG capsule    Sig: Take 1 capsule (20 mg total) by mouth daily.    Dispense:  30 capsule    Refill:  3    Follow-up: Return for Follow-up of chronic medical conditions, keep previously scheduled appointment.Arnoldo Morale MD

## 2017-07-15 ENCOUNTER — Telehealth: Payer: Self-pay | Admitting: *Deleted

## 2017-07-15 NOTE — Telephone Encounter (Signed)
Spoke with patient and informed her that her lab results are unremarkable. Advised Dr Leta Baptist will continue with her current plan.  Advised that after her MRI next Monday, she will get a call with results when they are available. She verbalized understanding, appreciation.

## 2017-07-18 ENCOUNTER — Other Ambulatory Visit: Payer: Self-pay

## 2017-07-19 ENCOUNTER — Ambulatory Visit
Admission: RE | Admit: 2017-07-19 | Discharge: 2017-07-19 | Disposition: A | Discharge status: Home / Self Care | Payer: Medicaid Other | Source: Ambulatory Visit | Attending: Diagnostic Neuroimaging | Admitting: Diagnostic Neuroimaging

## 2017-07-19 ENCOUNTER — Other Ambulatory Visit: Payer: Self-pay

## 2017-07-19 DIAGNOSIS — R200 Anesthesia of skin: Secondary | ICD-10-CM | POA: Diagnosis not present

## 2017-07-19 DIAGNOSIS — R413 Other amnesia: Secondary | ICD-10-CM | POA: Diagnosis not present

## 2017-07-19 DIAGNOSIS — R202 Paresthesia of skin: Principal | ICD-10-CM

## 2017-07-19 MED ORDER — GADOBENATE DIMEGLUMINE 529 MG/ML IV SOLN
18.0000 mL | Freq: Once | INTRAVENOUS | Status: AC | PRN
  Administered 2017-07-19: 18 mL via INTRAVENOUS

## 2017-07-21 ENCOUNTER — Telehealth: Payer: Self-pay | Admitting: *Deleted

## 2017-07-21 NOTE — Telephone Encounter (Signed)
LMVM for pt on mobile that her MRI brain result was unremarkable imaging results.  She is to call back if questions.

## 2017-07-21 NOTE — Telephone Encounter (Signed)
-----   Message from Penni Bombard, MD sent at 07/20/2017  2:56 PM EDT ----- Unremarkable imaging results. Please call patient. Continue current plan. -VRP

## 2017-07-24 ENCOUNTER — Other Ambulatory Visit: Payer: Self-pay | Admitting: Family Medicine

## 2017-07-24 DIAGNOSIS — E1100 Type 2 diabetes mellitus with hyperosmolarity without nonketotic hyperglycemic-hyperosmolar coma (NKHHC): Secondary | ICD-10-CM

## 2017-07-26 ENCOUNTER — Other Ambulatory Visit: Payer: Self-pay | Admitting: Family Medicine

## 2017-07-26 ENCOUNTER — Ambulatory Visit: Payer: Self-pay | Admitting: Family Medicine

## 2017-07-26 ENCOUNTER — Telehealth: Payer: Self-pay | Admitting: Family Medicine

## 2017-07-26 DIAGNOSIS — E1100 Type 2 diabetes mellitus with hyperosmolarity without nonketotic hyperglycemic-hyperosmolar coma (NKHHC): Secondary | ICD-10-CM

## 2017-07-26 DIAGNOSIS — E119 Type 2 diabetes mellitus without complications: Secondary | ICD-10-CM

## 2017-07-26 NOTE — Telephone Encounter (Signed)
Pt called to request a refill for acyclovir (ZOVIRAX) 400 MG tablet  Please sent it to the [facility] on [address], please follow up

## 2017-07-26 NOTE — Telephone Encounter (Signed)
Refill request

## 2017-07-26 NOTE — Telephone Encounter (Signed)
Will forward to PCP 

## 2017-07-27 MED ORDER — ACCU-CHEK AVIVA DEVI: 0 refills | Status: DC

## 2017-07-27 MED ORDER — ACYCLOVIR 400 MG PO TABS: 400.0000 mg | ORAL_TABLET | Freq: Two times a day (BID) | ORAL | 2 refills | Status: DC

## 2017-07-27 NOTE — Telephone Encounter (Signed)
Refilled

## 2017-07-27 NOTE — Telephone Encounter (Signed)
I was out of the office yesterday and sent in this refill on returning this morning.

## 2017-07-27 NOTE — Telephone Encounter (Signed)
Patient called checking on status of medication refill on acyclovir (ZOVIRAX) 400 MG tablet , pt is upset because her pharmacy has been requesting it with no response. [facility] [address] Please f/up

## 2017-08-27 ENCOUNTER — Telehealth: Payer: Self-pay | Admitting: Family Medicine

## 2017-08-27 DIAGNOSIS — E0800 Diabetes mellitus due to underlying condition with hyperosmolarity without nonketotic hyperglycemic-hyperosmolar coma (NKHHC): Secondary | ICD-10-CM

## 2017-08-27 MED ORDER — LISINOPRIL 20 MG PO TABS: 20.0000 mg | ORAL_TABLET | Freq: Every day | ORAL | 2 refills | Status: DC

## 2017-08-27 NOTE — Telephone Encounter (Signed)
Pt called to request the following referral A GYN A Podiatry exam for Diabetes And Mammogram  please if you need to the the Pt before can be auth the referral let her know, please follow up

## 2017-08-27 NOTE — Telephone Encounter (Signed)
Refilled

## 2017-08-27 NOTE — Telephone Encounter (Signed)
Pt called to request a refill for lisinopril (PRINIVIL,ZESTRIL) 20 MG tablet [  please follow up [facility] [address]

## 2017-08-28 ENCOUNTER — Other Ambulatory Visit: Payer: Self-pay | Admitting: Family Medicine

## 2017-08-28 DIAGNOSIS — M79652 Pain in left thigh: Principal | ICD-10-CM

## 2017-08-28 DIAGNOSIS — M79651 Pain in right thigh: Secondary | ICD-10-CM

## 2017-09-07 ENCOUNTER — Telehealth: Payer: Self-pay | Admitting: Family Medicine

## 2017-09-07 DIAGNOSIS — N393 Stress incontinence (female) (male): Secondary | ICD-10-CM

## 2017-09-07 NOTE — Telephone Encounter (Signed)
Pt called to request a refill for  oxybutynin (DITROPAN) 5 MG tablet [ [facility] [address] Please follow up

## 2017-09-08 MED ORDER — OXYBUTYNIN CHLORIDE 5 MG PO TABS: 5.0000 mg | ORAL_TABLET | Freq: Two times a day (BID) | ORAL | 3 refills | Status: DC

## 2017-09-08 NOTE — Telephone Encounter (Signed)
Refilled

## 2017-09-09 ENCOUNTER — Ambulatory Visit: Payer: Medicaid Other | Admitting: Allergy & Immunology

## 2017-09-09 ENCOUNTER — Encounter: Payer: Self-pay | Admitting: Allergy & Immunology

## 2017-09-09 VITALS — BP 128/82 | HR 84 | Temp 98.3°F | Ht 64.0 in | Wt 194.2 lb

## 2017-09-09 DIAGNOSIS — H938X3 Other specified disorders of ear, bilateral: Secondary | ICD-10-CM

## 2017-09-09 DIAGNOSIS — B370 Candidal stomatitis: Secondary | ICD-10-CM | POA: Diagnosis not present

## 2017-09-09 DIAGNOSIS — F17200 Nicotine dependence, unspecified, uncomplicated: Secondary | ICD-10-CM

## 2017-09-09 DIAGNOSIS — B999 Unspecified infectious disease: Secondary | ICD-10-CM | POA: Diagnosis not present

## 2017-09-09 DIAGNOSIS — T783XXD Angioneurotic edema, subsequent encounter: Secondary | ICD-10-CM

## 2017-09-09 MED ORDER — FLUTICASONE PROPIONATE 50 MCG/ACT NA SUSP: 2.0000 | Freq: Every day | NASAL | 6 refills | Status: DC

## 2017-09-09 MED ORDER — BUPROPION HCL ER (SR) 150 MG PO TB12: 150.0000 mg | ORAL_TABLET | Freq: Every day | ORAL | 1 refills | Status: DC

## 2017-09-09 MED ORDER — NICOTINE 21 MG/24HR TD PT24: 21.0000 mg | MEDICATED_PATCH | Freq: Every day | TRANSDERMAL | 1 refills | Status: DC

## 2017-09-09 MED ORDER — CETIRIZINE HCL 10 MG PO TABS: 10.0000 mg | ORAL_TABLET | Freq: Every day | ORAL | 6 refills | Status: DC

## 2017-09-09 MED ORDER — FLUCONAZOLE 150 MG PO TABS: 150.0000 mg | ORAL_TABLET | ORAL | 0 refills | Status: AC

## 2017-09-09 NOTE — Patient Instructions (Addendum)
1. Angioedema - unknown trigger - I do not think that allergy testing would be useful at this point, especially since you have had two negative blood tests. - We will get some labs to rule out hereditary angioedema. - We will also get some labs to look for autoimmune thyroid disease. - Try taking cetirizine 10mg  daily to see if this suppresses your swelling episodes.  2. Ear fullness - Try using Flonase two sprays per nostril daily to help control inflammation in your nose and allow drainage of the Eustachian tube. - This needs to be used daily for the best effect. - I think an ENT referral would be very helpful.  3. Smoking - I did refill your Wellbutrin and Nicoderm patch for one month. - Talk to your PCP about getting more refills.  4. Oral thrush - We will send in Diflucan to try to clear the thrush in your mouth.  - We will also check your immune status to see if you are prone to yeast infections.  5. Return in about 3 months (around 12/10/2017).   Please inform us of any Emergency Department visits, hospitalizations, or changes in symptoms. Call us before going to the ED for breathing or allergy symptoms since we might be able to fit you in for a sick visit. Feel free to contact us anytime with any questions, problems, or concerns.  It was a pleasure to meet you today! Enjoy the Thanksgiving season!  Websites that have reliable patient information: 1. American Academy of Asthma, Allergy, and Immunology: www.aaaai.org 2. Food Allergy Research and Education (FARE): foodallergy.org 3. Mothers of Asthmatics: http://www.asthmacommunitynetwork.org 4. American College of Allergy, Asthma, and Immunology: www.acaai.org

## 2017-09-09 NOTE — Progress Notes (Signed)
NEW PATIENT  Date of Service/Encounter:  09/09/17  Referring provider: Arnoldo Morale, MD   Assessment:   Angioedema - unknown trigger, but with possible hormonal component  Oral candidiasis - in the setting of type 2 diabetes  Recurrent infections - mostly fungal (thrush) infections  Current smoker  Ear fullness, bilateral  Complicated past medical history, including fibromyalgia and type 2 diabetes  Disabled status  Plan/Recommendations:   1. Angioedema - unknown trigger - I do not think that allergy testing would be useful at this point, especially since you have had two negative blood tests. - We will get some labs to rule out hereditary angioedema, although this seems unlikely.  - We will also get some labs to look for autoimmune thyroid disease; thyroid studies have been normal in the past, but thyroid disease can cause facial swelling. - Try taking cetirizine 10mg  daily to see if this suppresses your swelling episodes, which would rule out a histaminergic process if unsuccessful.  - I do not see a role for Xolair in Ms. Degroote's care since this is not clear angioedema with urticaria.  2. Ear fullness - Try using Flonase two sprays per nostril daily to help control inflammation in your nose and allow drainage of the Eustachian tube. - This needs to be used daily for the best effect. - I think an ENT referral would be very helpful. - Her current smoking is likely contributing to her problems as well, therefore I encouraged her to continue her journey.   3. Smoking - I did refill your Wellbutrin and Nicoderm patch for one month. - Talk to your PCP about getting more refills.  4. Oral thrush - We will send in Diflucan to try to clear the thrush in your mouth.  - We will also check your immune status to see if you are prone to yeast infections. - The rest of her infection history is largely unremarkable.   5. Return in about 3 months (around  12/10/2017).  Subjective:   Bethanne Mule is a 41 y.o. female presenting today for evaluation of  Chief Complaint  Patient presents with  . Facial Swelling    3 yrs  . Headache    5 yrs onset 4 days before menstrual cycle and 4 days after leaves    [name] has a history of the following: Patient Active Problem List   Diagnosis Date Noted  . GERD without esophagitis 07/12/2017  . Degenerative disc disease at L5-S1 level 05/14/2017  . Fibromyalgia 05/14/2017  . ADD (attention deficit disorder) 05/14/2017  . Urinary incontinence 04/05/2017  . Diabetes mellitus without complication ([place]) 09/32/3557  . Thigh pain 02/02/2017  . Anxiety and depression 02/02/2017  . Tobacco abuse 02/02/2017  . Facial flushing 02/02/2017  . Hypertension 10/23/2016  . Hidradenitis 10/23/2016    History obtained from: chart review and patient.  [name] was referred by Arnoldo Morale, MD.     Shyenne is a 41 y.o. female presenting for an evaluation of episodes of swelling. She reports that around five years ago, she developed allergy symptoms with sinus pain and congestion. She reports that she was getting headaches that radiated up to the top of her head from her neck. This is more left sided. After two years of trying to figure these headaches out, she started developing swelling of her face, which occurred at the same time as the neck pain. Over the last three years, she has developed blurry vision and distortion of her face and  head. She also reports cognitive changes as well, which seem to the mostly occurring after the swelling resolves. Symptoms last for around one hour before the symptoms subside. However, then she is left with some "after pressure" and irritation for several more hours. This seems to occur three days before her menstrual cycle and then four days after her cycle ends. She will have little bouts of it, until the next cycle starts. She will have around ten  symptom free days. Therefore, despite the worsening with the menstrual cycle, it seems that she also has outbreaks even in the absence of hormonal factors. She will occasionally have some "irritation" but not itching. Occasionally there is one hive noted, but this is clearly not a dominant symptom. She does endorse erythema, although even she admits that this could be related to her anxiety associated with these symptoms. There are not permanent skin changes noted after the hives and swelling resolve.   She has not tried taking antihistamines to see if this helps. However, she was prescribed Lasix as a means to help improve the swelling; this did not seem to do much when she has tried it. She has never needed epinephrine for this and has never required a hospitalization. This swelling has only ever included her face, and has been noticeably absent from the arms and legs. She does report some esophageal tightening, which she says "might represent internal swelling". However, she denies distinct abdominal pain. She has never had any surgical interventions of her abdomen due to unexplained stomach pain.   As part of this workup, she was sent for an MRI and a cyst was noted on her right side of her nose.  She has not seen an ear nose and throat doctor for this finding yet.  She does report that she has an abscess like nodule on the right side of her nasal bridge.  It appears as a pimple, but never has popped.  She does endorse some problems with breathing through her nose.  There are no other family members with similar symptoms and she is definitely not aware of any other family members who have anything remotely similar to her clinical presentation. She did have a loss of 31 pounds, which was not intentional but possibly secondary to depression and overall health.  However, she is starting to gain weight once again.  Her last thyroid studies were normal earlier in 2018.  She has a history of type 2 diabetes,  which is not insulin-dependent at this time.  Her last hemoglobin A1c was in July and was 5.9.  She reports that she also reacted to metoprolol and developed the "worst sinus infection ever".  She also developed a similar reaction to Xanax. She has been a smoker for a long period of time. She is on Wellbutrin and the patch and is trying to quit smoking.  She ran out of refills recently, and is gone without both of these for about 1 month.  She did have a back slide in his smoking slightly more at this point.  She moved to [place] within the last year, and reports increased sneezing while in [place].  However, when she lived in [place] she had no symptoms consistent with allergic rhinitis.  In fact, she had blood testing for allergies in [place] and this was negative as well.  She has had animals in the past, but currently does not have any.  She does have recurrent yeast infections. She is trying to use probiotics to help  improve these.  She denies sinus infections or pneumonias.  She needs antibiotics 1, maybe 2 times per year.  She does have a history of fibromyalgia as well as a positive ANA.  She is followed by a rheumatologist and has had a workup for lupus in the past year, which thankfully was negative.   Food Panel (Feb 2018)   Environmental Panel (Feb 2018)    MRI Brain (Sept 2018)         Otherwise, there is no history of other atopic diseases, including asthma, drug allergies, stinging insect allergies, or urticaria. There is no significant infectious history. Vaccinations are up to date.    Past Medical History: Patient Active Problem List   Diagnosis Date Noted  . GERD without esophagitis 07/12/2017  . Degenerative disc disease at L5-S1 level 05/14/2017  . Fibromyalgia 05/14/2017  . ADD (attention deficit disorder) 05/14/2017  . Urinary incontinence 04/05/2017  . Diabetes mellitus without complication ([place]) 58/85/0277  . Thigh pain 02/02/2017  . Anxiety and  depression 02/02/2017  . Tobacco abuse 02/02/2017  . Facial flushing 02/02/2017  . Hypertension 10/23/2016  . Hidradenitis 10/23/2016    Medication List:  Allergies as of 09/09/2017      Reactions   Metformin And Related Nausea And Vomiting   Penicillins    Xanax [alprazolam]    Glyburide Other (See Comments)   "blood sugar dropped uncontrollably"   Linagliptin Diarrhea, Other (See Comments)   Stomach pain, sinus infection   Moxifloxacin Diarrhea      Medication List        Accurate as of 09/09/17  2:19 PM. Always use your most recent med list.          ACCU-CHEK AVIVA device Use as instructed daily.   accu-chek softclix lancets Use as instructed daily.   acyclovir 400 MG tablet Commonly known as:  ZOVIRAX Take 1 tablet (400 mg total) by mouth 2 (two) times daily.   amLODipine 10 MG tablet Commonly known as:  NORVASC Take 1 tablet (10 mg total) by mouth daily.   atorvastatin 40 MG tablet Commonly known as:  LIPITOR Take 1 tablet (40 mg total) by mouth daily.   buPROPion 150 MG 12 hr tablet Commonly known as:  WELLBUTRIN SR Take 1 tablet (150 mg total) daily by mouth.   cetirizine 10 MG tablet Commonly known as:  ZYRTEC Take 1 tablet (10 mg total) daily by mouth.   diazepam 5 MG tablet Commonly known as:  VALIUM Take 5 mg by mouth every 6 (six) hours as needed for anxiety.   DULoxetine 60 MG capsule Commonly known as:  CYMBALTA Take 1 capsule (60 mg total) by mouth daily.   Exenatide ER 2 MG Pen Commonly known as:  BYDUREON INJECT 2 MG INTO THE SKIN ONCE A WEEK.   fluconazole 150 MG tablet Commonly known as:  DIFLUCAN Take 1 tablet (150 mg total) once a week for 14 days by mouth.   fluticasone 50 MCG/ACT nasal spray Commonly known as:  FLONASE Place 2 sprays daily into both nostrils.   glucose blood test strip Commonly known as:  ACCU-CHEK AVIVA Use as instructed daily   lisinopril 20 MG tablet Commonly known as:  PRINIVIL,ZESTRIL Take 1  tablet (20 mg total) by mouth daily.   meloxicam 7.5 MG tablet Commonly known as:  MOBIC TAKE 1 TABLET BY MOUTH DAILY.   methocarbamol 750 MG tablet Commonly known as:  ROBAXIN TAKE 1 TABLET BY MOUTH 4 TIMES DAILY  mupirocin ointment 2 % Commonly known as:  BACTROBAN Apply to AA bid X 1 week   nicotine 21 mg/24hr patch Commonly known as:  NICODERM CQ Place 1 patch (21 mg total) daily onto the skin.   omeprazole 20 MG capsule Commonly known as:  PRILOSEC Take 1 capsule (20 mg total) by mouth daily.   oxybutynin 5 MG tablet Commonly known as:  DITROPAN Take 1 tablet (5 mg total) 2 (two) times daily by mouth.   podophyllum resin 25 % Soln Commonly known as:  PODOCON-25 Apply 1 mL topically once a week. To wart on L index finger   sertraline 50 MG tablet Commonly known as:  ZOLOFT Take by mouth.   traZODone 100 MG tablet Commonly known as:  DESYREL Take 1 tablet (100 mg total) by mouth at bedtime as needed for sleep.       Birth History: non-contributory.   Developmental History: non-contributory.   Past Surgical History: Past Surgical History:  Procedure Laterality Date  . APPENDECTOMY  1990  . ceasarian  2002  . CHOLECYSTECTOMY  2011  . URINARY SURGERY     urethra sling, removal, and then revision 2016 (4 surgeries)     Family History: Family History  Problem Relation Age of Onset  . Cancer Mother        cervical, breast, lungm skin  . Diabetes Father   . Multiple sclerosis Paternal Grandmother   . Allergic rhinitis Neg Hx   . Angioedema Neg Hx   . Asthma Neg Hx   . Eczema Neg Hx   . Urticaria Neg Hx      Social History: Jora lives at home in an apartment. She lives in an apartment in [place]. There is carpeting throughout the apartment. There are no mold issues to her knowledge. There are no pets at home. Her apartment in [place] had no isuses either. She does have dust mite coverings in the bedding. There is smoking exposure; she has  smoked since 1990, but is working hard to quit smoking.     Review of Systems: a 14-point review of systems is pertinent for what is mentioned in HPI.  Otherwise, all other systems were negative. Constitutional: negative other than that listed in the HPI Eyes: negative other than that listed in the HPI Ears, nose, mouth, throat, and face: negative other than that listed in the HPI Respiratory: negative other than that listed in the HPI Cardiovascular: negative other than that listed in the HPI Gastrointestinal: negative other than that listed in the HPI Genitourinary: negative other than that listed in the HPI Integument: negative other than that listed in the HPI Hematologic: negative other than that listed in the HPI Musculoskeletal: negative other than that listed in the HPI Neurological: negative other than that listed in the HPI Allergy/Immunologic: negative other than that listed in the HPI    Objective:   Blood pressure 128/82, pulse 84, temperature 98.3 F (36.8 C), temperature source Oral, height 5\' 4"  (1.626 m), weight 194 lb 3.2 oz (88.1 kg). Body mass index is 33.33 kg/m.   Physical Exam:  General: Alert, interactive, in no acute distress. Talkative. Somewhat flight of ideas. Eyes: No conjunctival injection bilaterally, no discharge on the right, no discharge on the left and no Horner-Trantas dots present. PERRL bilaterally. EOMI without pain. No photophobia.  Ears: Right TM pearly gray with normal light reflex, Left TM pearly gray with normal light reflex, Right TM intact without perforation and Left TM intact without perforation.  Nose/Throat: External nose within normal limits and septum midline. Turbinates markedly edematous with clear discharge. Posterior oropharynx mildly erythematous without cobblestoning in the posterior oropharynx. Tonsils 2+ without exudates.  Tongue without thrush. Neck: Supple without thyromegaly. Trachea midline. Adenopathy: shoddy  bilateral anterior cervical lymphadenopathy and no enlarged lymph nodes appreciated in the occipital, axillary, epitrochlear, inguinal, or popliteal regions. Lungs: Clear to auscultation without wheezing, rhonchi or rales. No increased work of breathing. CV: Normal S1/S2. No murmurs. Capillary refill <2 seconds.  Abdomen: Nondistended, nontender. No guarding or rebound tenderness. Bowel sounds present in all fields and hypoactive  Skin: Warm and dry, without lesions or rashes. Extremities:  No clubbing, cyanosis or edema. Neuro:   Grossly intact. No focal deficits appreciated. Responsive to questions.  Diagnostic studies: none      Salvatore Marvel, MD [facility]

## 2017-09-12 ENCOUNTER — Other Ambulatory Visit: Payer: Self-pay | Admitting: Family Medicine

## 2017-09-12 DIAGNOSIS — E119 Type 2 diabetes mellitus without complications: Secondary | ICD-10-CM

## 2017-09-13 ENCOUNTER — Telehealth: Payer: Self-pay | Admitting: Family Medicine

## 2017-09-13 NOTE — Telephone Encounter (Signed)
We will discuss that at her office visit

## 2017-09-13 NOTE — Telephone Encounter (Signed)
Pt. Came to facility requesting a refill on diazepam (VALIUM) 5 MG tablet Please f/u with pt.

## 2017-09-13 NOTE — Telephone Encounter (Signed)
Patient came in asking for [facility] [address]

## 2017-09-13 NOTE — Telephone Encounter (Signed)
Patient came in asking for a referral I made her and appointment for wed at 2

## 2017-09-13 NOTE — Telephone Encounter (Signed)
Called pt. And LVM to return call regarding Ortho referral.

## 2017-09-13 NOTE — Telephone Encounter (Signed)
Noted  

## 2017-09-15 ENCOUNTER — Encounter: Payer: Self-pay | Admitting: Family Medicine

## 2017-09-15 ENCOUNTER — Ambulatory Visit: Payer: Medicaid Other | Attending: Family Medicine | Admitting: Family Medicine

## 2017-09-15 ENCOUNTER — Ambulatory Visit: Payer: Medicaid Other | Admitting: Licensed Clinical Social Worker

## 2017-09-15 VITALS — BP 107/70 | HR 77 | Temp 98.6°F | Ht 64.0 in | Wt 195.8 lb

## 2017-09-15 DIAGNOSIS — M25551 Pain in right hip: Secondary | ICD-10-CM | POA: Diagnosis not present

## 2017-09-15 DIAGNOSIS — Z79899 Other long term (current) drug therapy: Secondary | ICD-10-CM | POA: Insufficient documentation

## 2017-09-15 DIAGNOSIS — J329 Chronic sinusitis, unspecified: Secondary | ICD-10-CM | POA: Insufficient documentation

## 2017-09-15 DIAGNOSIS — F329 Major depressive disorder, single episode, unspecified: Secondary | ICD-10-CM

## 2017-09-15 DIAGNOSIS — N393 Stress incontinence (female) (male): Secondary | ICD-10-CM

## 2017-09-15 DIAGNOSIS — Z888 Allergy status to other drugs, medicaments and biological substances status: Secondary | ICD-10-CM | POA: Diagnosis not present

## 2017-09-15 DIAGNOSIS — Z880 Allergy status to penicillin: Secondary | ICD-10-CM | POA: Insufficient documentation

## 2017-09-15 DIAGNOSIS — K859 Acute pancreatitis without necrosis or infection, unspecified: Secondary | ICD-10-CM | POA: Diagnosis not present

## 2017-09-15 DIAGNOSIS — Z8661 Personal history of infections of the central nervous system: Secondary | ICD-10-CM | POA: Diagnosis not present

## 2017-09-15 DIAGNOSIS — Z9049 Acquired absence of other specified parts of digestive tract: Secondary | ICD-10-CM | POA: Diagnosis not present

## 2017-09-15 DIAGNOSIS — Z791 Long term (current) use of non-steroidal anti-inflammatories (NSAID): Secondary | ICD-10-CM | POA: Diagnosis not present

## 2017-09-15 DIAGNOSIS — E119 Type 2 diabetes mellitus without complications: Secondary | ICD-10-CM | POA: Diagnosis not present

## 2017-09-15 DIAGNOSIS — M797 Fibromyalgia: Secondary | ICD-10-CM | POA: Diagnosis not present

## 2017-09-15 DIAGNOSIS — F32A Anxiety disorder, unspecified: Secondary | ICD-10-CM

## 2017-09-15 DIAGNOSIS — I10 Essential (primary) hypertension: Secondary | ICD-10-CM | POA: Diagnosis not present

## 2017-09-15 DIAGNOSIS — Z1231 Encounter for screening mammogram for malignant neoplasm of breast: Secondary | ICD-10-CM | POA: Diagnosis not present

## 2017-09-15 DIAGNOSIS — E1165 Type 2 diabetes mellitus with hyperglycemia: Secondary | ICD-10-CM | POA: Diagnosis not present

## 2017-09-15 DIAGNOSIS — F419 Anxiety disorder, unspecified: Secondary | ICD-10-CM | POA: Diagnosis not present

## 2017-09-15 DIAGNOSIS — Z794 Long term (current) use of insulin: Secondary | ICD-10-CM | POA: Diagnosis not present

## 2017-09-15 DIAGNOSIS — E1149 Type 2 diabetes mellitus with other diabetic neurological complication: Secondary | ICD-10-CM | POA: Diagnosis not present

## 2017-09-15 DIAGNOSIS — Z1239 Encounter for other screening for malignant neoplasm of breast: Secondary | ICD-10-CM

## 2017-09-15 DIAGNOSIS — J328 Other chronic sinusitis: Secondary | ICD-10-CM | POA: Diagnosis not present

## 2017-09-15 LAB — GLUCOSE, POCT (MANUAL RESULT ENTRY): POC Glucose: 224 mg/dl — AB (ref 70–99)

## 2017-09-15 MED ORDER — FLUTICASONE PROPIONATE 50 MCG/ACT NA SUSP: 2.0000 | Freq: Every day | NASAL | 6 refills | Status: DC

## 2017-09-15 MED ORDER — INSULIN PEN NEEDLE 31G X 5 MM MISC: 1.0000 | Freq: Three times a day (TID) | 1 refills | Status: DC

## 2017-09-15 MED ORDER — ACYCLOVIR 400 MG PO TABS: 400.0000 mg | ORAL_TABLET | Freq: Two times a day (BID) | ORAL | 2 refills | Status: DC

## 2017-09-15 MED ORDER — INSULIN ASPART 100 UNIT/ML FLEXPEN: PEN_INJECTOR | SUBCUTANEOUS | 1 refills | Status: DC

## 2017-09-15 MED ORDER — ATORVASTATIN CALCIUM 40 MG PO TABS: 40.0000 mg | ORAL_TABLET | Freq: Every day | ORAL | 3 refills | Status: DC

## 2017-09-15 MED ORDER — OXYBUTYNIN CHLORIDE 5 MG PO TABS: 5.0000 mg | ORAL_TABLET | Freq: Two times a day (BID) | ORAL | 3 refills | Status: DC

## 2017-09-15 MED ORDER — AMLODIPINE BESYLATE 10 MG PO TABS: 10.0000 mg | ORAL_TABLET | Freq: Every day | ORAL | 3 refills | Status: DC

## 2017-09-15 MED ORDER — MELOXICAM 7.5 MG PO TABS: 7.5000 mg | ORAL_TABLET | Freq: Every day | ORAL | 1 refills | Status: DC

## 2017-09-15 MED ORDER — LISINOPRIL 20 MG PO TABS: 20.0000 mg | ORAL_TABLET | Freq: Every day | ORAL | 2 refills | Status: DC

## 2017-09-15 MED ORDER — OMEPRAZOLE 20 MG PO CPDR: 20.0000 mg | DELAYED_RELEASE_CAPSULE | Freq: Every day | ORAL | 3 refills | Status: DC

## 2017-09-15 MED ORDER — EXENATIDE ER 2 MG ~~LOC~~ PEN: PEN_INJECTOR | SUBCUTANEOUS | 3 refills | Status: DC

## 2017-09-15 NOTE — BH Specialist Note (Signed)
Integrated Behavioral Health Initial Visit  MRN: [id number] Name: Amber Rose   Session Start time: 3:00 PM Session End time: 3:30 PM Total time: 30 minutes  Type of Service: [place] Interpretor:No. Interpretor Name and Language: N/A   Warm Hand Off Completed.       SUBJECTIVE: Amber Rose is a 41 y.o. female accompanied by patient. Patient was referred by Dr. Jarold Rose for depression and anxiety. Patient reports the following symptoms/concerns: feelings of sadness and worry, difficulty sleeping, low energy, difficulty concentrating, racing thoughts, chronic pain, panic attacks, and hx of suicidal ideations Duration of problem: "years"; Severity of problem: severe  OBJECTIVE: Mood: Depressed and Affect: Appropriate Risk of harm to self or others: No plan to harm self or others   LIFE CONTEXT: Family and Social: Pt receives support from her adult niece and her children (22, 74, and 55 yo) She has strained relationship with mother, who resides in [place], [place] School/Work: Pt is unemployed and has applied for disability. Self-Care: Pt stated that she was able to quit smoking for a month; however, recently relapsed due to increased stress Life Changes: Pt has ongoing medical concerns that result in chronic pain. She has been out of medication for approximately a week and requests referral to a psychiatrist  GOALS ADDRESSED: Patient will reduce symptoms of: agitation, anxiety and depression and increase knowledge and/or ability of: coping skills and also: Increase adequate support systems for patient/family and Decrease self-medicating behaviors   INTERVENTIONS: Mindfulness or Relaxation Training, Supportive Counseling and Link to [employer]  Standardized Assessments completed: Pt was not provided screenings  ASSESSMENT: Patient currently experiencing depression and anxiety triggered by ongoing medical concerns. She reports  feelings of sadness and worry, difficulty sleeping, low energy, difficulty concentrating, racing thoughts, chronic pain, panic attacks, and hx of suicidal ideations. Pt denies current SI/HI/AVH. She receives emotional and financial support from family.   Pt reports that she is no longer receiving psychotherapy due to financial strain. She has been having difficulty managing emotions due to chronic pain and returned to smoking cigarettes after being sober for a month. L[facility]WA provided encouragement and validation of feelings. Pt was taught mindfulness interventions to assist in decrease of symptoms and emotion regulation. Pt successfully identified ways to motivate self to meet health goals in the future. Community resources for psychotherapy and medication management was provided to pt.     PLAN: 1. Follow up with behavioral health clinician on : Pt was encouraged to contact [facility]CSWA if symptoms worsen or fail to improve to schedule behavioral appointments at [facility]. 2. Behavioral recommendations: LCSWA recommends that pt apply healthy coping skills discussed and utilize provided resources. Pt is encouraged to schedule follow up appointment with LCSWA 3. Referral(s): [facility] (LME/Outside Clinic) 4. "From scale of 1-10, how likely are you to follow plan?": 9/10  Amber [place], LCSW 09/15/17 6:31 PM

## 2017-09-15 NOTE — Progress Notes (Signed)
Subjective:  Patient ID: Amber Rose, female    DOB: [birth year]  Age: 41 y.o. MRN: [id number]  CC: Medication Refill   HPI Amber Rose  is a 41 year old female with type 2 diabetes mellitus (A1c 5.9), hypertension, depression and anxiety, hydradenitis, tobacco abuse, fibromyalgia here requesting refill of Valium which she said she was prescribed by her previous doctors in [place] for treatment of anxiety.  She currently takes Cymbalta and undergoes counseling sessions.  Cymbalta was placed on hold due to her being commenced on Wellbutrin to aid with smoking cessation.  She was seen by allergy and immunology on 09/09/17 due to concerns for angioedema and urticaria and notes reviewed.  Labs were done to rule out hereditary angioedema and she was commenced on Zyrtec which she has not been taking. Labs reviewed - elevated IGA, slightly elevated neutrophils, other labs pending. ENT referral was also recommended due to complains of ear fullness and smoking cessation therapy commenced. The patient informs me she was informed at her visit that she had a cyst in her right nose and would need to see ENT.  She is requesting a referral [facility] because she was informed she has a 'tilted pelvis' and will need some further scans.  She had presented to [facility] orthopedic urgent care with right hip pain for which she was prescribed a Medrol Dosepak and provided with crutches for ambulation which she does not have with her today. We do not have records available to [place] at this time and the patient reports improvement in his symptoms with the use of her Medrol Dosepak.  She is requesting a referral for a mammogram.  Past Medical History:  Diagnosis Date  . Anxiety   . Arthritis   . Chronic UTI   . Depression   . Diabetes mellitus without complication ([place])   . Fibromyalgia   . Hypertension   . Meningitis, viral   . Pancreatitis     Past Surgical History:    Procedure Laterality Date  . APPENDECTOMY  1990  . ceasarian  2002  . CHOLECYSTECTOMY  2011  . URINARY SURGERY     urethra sling, removal, and then revision 2016 (4 surgeries)    Allergies  Allergen Reactions  . Metformin And Related Nausea And Vomiting  . Penicillins   . Xanax [Alprazolam]   . Glyburide Other (See Comments)    "blood sugar dropped uncontrollably"  . Linagliptin Diarrhea and Other (See Comments)    Stomach pain, sinus infection  . Moxifloxacin Diarrhea     Outpatient Medications Prior to Visit  Medication Sig Dispense Refill  . Blood Glucose Monitoring Suppl (ACCU-CHEK AVIVA PLUS) w/Device KIT USE AS DIRECTED DAILY. E11.9 1 kit 0  . buPROPion (WELLBUTRIN SR) 150 MG 12 hr tablet Take 1 tablet (150 mg total) daily by mouth. 30 tablet 1  . diazepam (VALIUM) 5 MG tablet Take 5 mg by mouth every 6 (six) hours as needed for anxiety.    Marland Kitchen glucose blood (ACCU-CHEK AVIVA) test strip Use as instructed daily 100 each 12  . Lancet Devices (ACCU-CHEK SOFTCLIX) lancets Use as instructed daily. 1 each 5  . methocarbamol (ROBAXIN) 750 MG tablet TAKE 1 TABLET BY MOUTH 4 TIMES DAILY 60 tablet 0  . mupirocin ointment (BACTROBAN) 2 % Apply to AA bid X 1 week 22 g 0  . nicotine (NICODERM CQ) 21 mg/24hr patch Place 1 patch (21 mg total) daily onto the skin. 28 patch 1  . traZODone (DESYREL)  100 MG tablet Take 1 tablet (100 mg total) by mouth at bedtime as needed for sleep. 30 tablet 3  . acyclovir (ZOVIRAX) 400 MG tablet Take 1 tablet (400 mg total) by mouth 2 (two) times daily. 60 tablet 2  . amLODipine (NORVASC) 10 MG tablet Take 1 tablet (10 mg total) by mouth daily. 90 tablet 3  . Exenatide ER (BYDUREON) 2 MG PEN INJECT 2 MG INTO THE SKIN ONCE A WEEK. 12 each 3  . lisinopril (PRINIVIL,ZESTRIL) 20 MG tablet Take 1 tablet (20 mg total) by mouth daily. 30 tablet 2  . meloxicam (MOBIC) 7.5 MG tablet TAKE 1 TABLET BY MOUTH DAILY. 30 tablet 0  . cetirizine (ZYRTEC) 10 MG tablet Take 1  tablet (10 mg total) daily by mouth. (Patient not taking: Reported on 09/15/2017) 30 tablet 6  . DULoxetine (CYMBALTA) 60 MG capsule Take 1 capsule (60 mg total) by mouth daily. (Patient not taking: Reported on 09/15/2017) 30 capsule 3  . fluconazole (DIFLUCAN) 150 MG tablet Take 1 tablet (150 mg total) once a week for 14 days by mouth. (Patient not taking: Reported on 09/15/2017) 2 tablet 0  . podophyllum resin (PODOCON-25) 25 % SOLN Apply 1 mL topically once a week. To wart on L index finger (Patient not taking: Reported on 09/15/2017) 15 mL 0  . sertraline (ZOLOFT) 50 MG tablet Take by mouth.    . atorvastatin (LIPITOR) 40 MG tablet Take 1 tablet (40 mg total) by mouth daily. (Patient not taking: Reported on 09/15/2017) 30 tablet 3  . fluticasone (FLONASE) 50 MCG/ACT nasal spray Place 2 sprays daily into both nostrils. (Patient not taking: Reported on 09/15/2017) 16 g 6  . omeprazole (PRILOSEC) 20 MG capsule Take 1 capsule (20 mg total) by mouth daily. (Patient not taking: Reported on 09/15/2017) 30 capsule 3  . oxybutynin (DITROPAN) 5 MG tablet Take 1 tablet (5 mg total) 2 (two) times daily by mouth. (Patient not taking: Reported on 09/15/2017) 60 tablet 3   No facility-administered medications prior to visit.     ROS Review of Systems  Constitutional: Negative for activity change, appetite change and fatigue.  HENT: Negative for congestion, sinus pressure and sore throat.   Eyes: Negative for visual disturbance.  Respiratory: Negative for cough, chest tightness, shortness of breath and wheezing.   Cardiovascular: Negative for chest pain and palpitations.  Gastrointestinal: Negative for abdominal distention, abdominal pain and constipation.  Endocrine: Negative for polydipsia.  Genitourinary: Negative for dysuria and frequency.  Musculoskeletal:       See hpi  Skin: Negative for rash.  Neurological: Negative for tremors, light-headedness and numbness.  Hematological: Does not  bruise/bleed easily.  Psychiatric/Behavioral: Negative for agitation and behavioral problems.    Objective:  BP 107/70   Pulse 77   Temp 98.6 F (37 C) (Oral)   Ht 5' 4" (1.626 m)   Wt 195 lb 12.8 oz (88.8 kg)   SpO2 98%   BMI 33.61 kg/m   BP/Weight 09/15/2017 09/09/2017 07/12/2017  Systolic BP 107 128 116  Diastolic BP 70 82 74  Wt. (Lbs) 195.8 194.2 194.8  BMI 33.61 33.33 38.04      Physical Exam  Constitutional: She is oriented to person, place, and time. She appears well-developed and well-nourished.  Cardiovascular: Normal rate, normal heart sounds and intact distal pulses.  No murmur heard. Pulmonary/Chest: Effort normal and breath sounds normal. She has no wheezes. She has no rales. She exhibits no tenderness.  Abdominal: Soft. Bowel sounds are   normal. She exhibits no distension and no mass. There is no tenderness.  Musculoskeletal: She exhibits tenderness (TTP and ROM of right hip).  Neurological: She is alert and oriented to person, place, and time.  Skin: Skin is warm and dry.  Psychiatric: She has a normal mood and affect.     Lab Results  Component Value Date   HGBA1C 6.1 (H) 09/09/2017    Assessment & Plan:   1. Type 2 diabetes mellitus with other neurologic complication, without long-term current use of insulin (HCC) Controlled with A1c of 6.1 Placed on NovoLog due to tendency of hyperglycemia with current Medrol Dosepak Diabetic diet - POCT glucose (manual entry) - insulin aspart (NOVOLOG FLEXPEN) 100 UNIT/ML FlexPen; 0-12 units subcutaneously 3 times daily before meals  Dispense: 15 mL; Refill: 1 - acyclovir (ZOVIRAX) 400 MG tablet; Take 1 tablet (400 mg total) by mouth 2 (two) times daily.  Dispense: 60 tablet; Refill: 2 - Exenatide ER (BYDUREON) 2 MG PEN; INJECT 2 MG INTO THE SKIN ONCE A WEEK.  Dispense: 12 each; Refill: 3 - lisinopril (PRINIVIL,ZESTRIL) 20 MG tablet; Take 1 tablet (20 mg total) by mouth daily.  Dispense: 30 tablet; Refill: 2 -  Ambulatory referral to Podiatry - Insulin Pen Needle 31G X 5 MM MISC; 1 each by Does not apply route 3 (three) times daily before meals.  Dispense: 90 each; Refill: 1  2. Hypertension, unspecified type Controlled - amLODipine (NORVASC) 10 MG tablet; Take 1 tablet (10 mg total) by mouth daily.  Dispense: 90 tablet; Refill: 3  3. Anxiety and depression Cymbalta on hold as she is currently taking Wellbutrin to aid with smoking cessation She is requesting refill of Valium for anxiety and I have informed SSRI is indicated for treatment of anxiety and depression than Valium LCSW called in to see the patient is to like to have psych referral for prescription of her Valium  - 4. Stress incontinence of urine - oxybutynin (DITROPAN) 5 MG tablet; Take 1 tablet (5 mg total) by mouth 2 (two) times daily.  Dispense: 60 tablet; Refill: 3  5. Other chronic sinusitis Referral placed to ENT as per patient request - Ambulatory referral to ENT - fluticasone (FLONASE) 50 MCG/ACT nasal spray; Place 2 sprays into both nostrils daily.  Dispense: 16 g; Refill: 6  6. Right hip pain Currently on a Medrol Dosepak which has brought about improvement in her symptoms We do not have any imaging in her chart She is insisting on a referral to [facility] Attempts at obtaining x-ray from [facility] orthopedic urgent care proved futile as they do not open until 5:30 PM at which time our clinic will be closed. The other option we have is to order another x-ray Patient informed to obtain x-ray report after which referral placed to orthopedic.  7. Screening for breast cancer - MM Digital Screening; Future   Meds ordered this encounter  Medications  . insulin aspart (NOVOLOG FLEXPEN) 100 UNIT/ML FlexPen    Sig: 0-12 units subcutaneously 3 times daily before meals    Dispense:  15 mL    Refill:  1  . acyclovir (ZOVIRAX) 400 MG tablet    Sig: Take 1 tablet (400 mg total) by mouth 2 (two) times daily.     Dispense:  60 tablet    Refill:  2  . amLODipine (NORVASC) 10 MG tablet    Sig: Take 1 tablet (10 mg total) by mouth daily.    Dispense:  90 tablet  Refill:  3  . atorvastatin (LIPITOR) 40 MG tablet    Sig: Take 1 tablet (40 mg total) by mouth daily.    Dispense:  30 tablet    Refill:  3  . Exenatide ER (BYDUREON) 2 MG PEN    Sig: INJECT 2 MG INTO THE SKIN ONCE A WEEK.    Dispense:  12 each    Refill:  3  . fluticasone (FLONASE) 50 MCG/ACT nasal spray    Sig: Place 2 sprays into both nostrils daily.    Dispense:  16 g    Refill:  6  . lisinopril (PRINIVIL,ZESTRIL) 20 MG tablet    Sig: Take 1 tablet (20 mg total) by mouth daily.    Dispense:  30 tablet    Refill:  2  . meloxicam (MOBIC) 7.5 MG tablet    Sig: Take 1 tablet (7.5 mg total) by mouth daily.    Dispense:  30 tablet    Refill:  1  . omeprazole (PRILOSEC) 20 MG capsule    Sig: Take 1 capsule (20 mg total) by mouth daily.    Dispense:  30 capsule    Refill:  3  . oxybutynin (DITROPAN) 5 MG tablet    Sig: Take 1 tablet (5 mg total) by mouth 2 (two) times daily.    Dispense:  60 tablet    Refill:  3  . Insulin Pen Needle 31G X 5 MM MISC    Sig: 1 each by Does not apply route 3 (three) times daily before meals.    Dispense:  90 each    Refill:  1    Follow-up: No Follow-up on file.   Enobong Amao MD   

## 2017-09-16 LAB — STREP PNEUMONIAE 23 SEROTYPES IGG
Pneumo Ab Type 1*: <0.1 ug/mL — ABNORMAL LOW (ref >1.3)
Pneumo Ab Type 12 (12F)*: <0.1 ug/mL — ABNORMAL LOW (ref >1.3)
Pneumo Ab Type 14*: 1.2 ug/mL — ABNORMAL LOW (ref >1.3)
Pneumo Ab Type 17 (17F)*: 0.1 ug/mL — ABNORMAL LOW (ref >1.3)
Pneumo Ab Type 19 (19F)*: 3.8 ug/mL (ref >1.3)
Pneumo Ab Type 2*: 0.8 ug/mL — ABNORMAL LOW (ref >1.3)
Pneumo Ab Type 20*: 1.2 ug/mL — ABNORMAL LOW (ref >1.3)
Pneumo Ab Type 22 (22F)*: <0.1 ug/mL — ABNORMAL LOW (ref >1.3)
Pneumo Ab Type 23 (23F)*: <0.1 ug/mL — ABNORMAL LOW (ref >1.3)
Pneumo Ab Type 26 (6B)*: <0.1 ug/mL — ABNORMAL LOW (ref >1.3)
Pneumo Ab Type 3*: 2.2 ug/mL (ref >1.3)
Pneumo Ab Type 34 (10A)*: <0.1 ug/mL — ABNORMAL LOW (ref >1.3)
Pneumo Ab Type 4*: <0.1 ug/mL — ABNORMAL LOW (ref >1.3)
Pneumo Ab Type 43 (11A)*: 1.2 ug/mL — ABNORMAL LOW (ref >1.3)
Pneumo Ab Type 5*: <0.2 ug/mL — ABNORMAL LOW (ref >1.3)
Pneumo Ab Type 51 (7F)*: <0.1 ug/mL — ABNORMAL LOW (ref >1.3)
Pneumo Ab Type 54 (15B)*: 0.6 ug/mL — ABNORMAL LOW (ref >1.3)
Pneumo Ab Type 56 (18C)*: 0.2 ug/mL — ABNORMAL LOW (ref >1.3)
Pneumo Ab Type 57 (19A)*: 2.4 ug/mL (ref >1.3)
Pneumo Ab Type 68 (9V)*: 0.9 ug/mL — ABNORMAL LOW (ref >1.3)
Pneumo Ab Type 70 (33F)*: 0.4 ug/mL — ABNORMAL LOW (ref >1.3)
Pneumo Ab Type 8*: 1.2 ug/mL — ABNORMAL LOW (ref >1.3)
Pneumo Ab Type 9 (9N)*: <0.1 ug/mL — ABNORMAL LOW (ref >1.3)

## 2017-09-16 LAB — T-HELPER CELLS (CD4) COUNT (NOT AT ARMC)
% CD 4 Pos. Lymph.: 57.7 % (ref 30.8–58.5)
Absolute CD 4 Helper: 1385 /uL (ref 359–1519)
Basophils Absolute: 0 10*3/uL (ref 0.0–0.2)
Basos: 0 %
EOS (ABSOLUTE): 0 10*3/uL (ref 0.0–0.4)
Eos: 0 %
Hematocrit: 37.6 % (ref 34.0–46.6)
Hemoglobin: 12.8 g/dL (ref 11.1–15.9)
Immature Grans (Abs): 0 10*3/uL (ref 0.0–0.1)
Immature Granulocytes: 0 %
Lymphocytes Absolute: 2.4 10*3/uL (ref 0.7–3.1)
Lymphs: 23 %
MCH: 29.9 pg (ref 26.6–33.0)
MCHC: 34 g/dL (ref 31.5–35.7)
MCV: 88 fL (ref 79–97)
Monocytes Absolute: 0.6 10*3/uL (ref 0.1–0.9)
Monocytes: 5 %
Neutrophils Absolute: 7.7 10*3/uL — ABNORMAL HIGH (ref 1.4–7.0)
Neutrophils: 72 %
Platelets: 376 10*3/uL (ref 150–379)
RBC: 4.28 x10E6/uL (ref 3.77–5.28)
RDW: 15 % (ref 12.3–15.4)
WBC: 10.8 10*3/uL (ref 3.4–10.8)

## 2017-09-16 LAB — IGG, IGA, IGM
IgA/Immunoglobulin A, Serum: 448 mg/dL — ABNORMAL HIGH (ref 87–352)
IgG (Immunoglobin G), Serum: 1006 mg/dL (ref 700–1600)
IgM (Immunoglobulin M), Srm: 63 mg/dL (ref 26–217)

## 2017-09-16 LAB — DIPHTHERIA / TETANUS ANTIBODY PANEL
Diphtheria Ab: 0.96 IU/mL (ref <0.10)
Tetanus Ab, IgG: 4.05 IU/mL (ref <0.10)

## 2017-09-16 LAB — HEMOGLOBIN A1C
Est. average glucose Bld gHb Est-mCnc: 128 mg/dL
Hgb A1c MFr Bld: 6.1 % — ABNORMAL HIGH (ref 4.8–5.6)

## 2017-09-16 LAB — C1 ESTERASE INHIBITOR, FUNCTIONAL: C1INH Functional/C1INH Total MFr SerPl: 106 %mean normal

## 2017-09-16 LAB — COMPLEMENT COMPONENT C1Q: Complement C1Q: 17.9 mg/dL (ref 11.8–24.4)

## 2017-09-16 LAB — THYROID ANTIBODIES
Thyroglobulin Antibody: <1 IU/mL (ref 0.0–0.9)
Thyroperoxidase Ab SerPl-aCnc: 11 IU/mL (ref 0–34)

## 2017-09-16 LAB — C1 ESTERASE INHIBITOR: C1INH SerPl-mCnc: 39 mg/dL (ref 21–39)

## 2017-09-20 ENCOUNTER — Encounter: Payer: Self-pay | Admitting: Allergy & Immunology

## 2017-09-23 ENCOUNTER — Telehealth: Payer: Self-pay | Admitting: Family Medicine

## 2017-09-23 NOTE — Telephone Encounter (Signed)
Pt was called and pt states that she will try to get the X_rays from urgent care.

## 2017-09-23 NOTE — Telephone Encounter (Signed)
Could you please give this patient a call and remind her of the plan from our last visit.  She was supposed to obtain imaging reports from the urgent care so I could refer her to [facility]ics- [facility]. If she is unable to obtain this report I would be happy to order another x-ray of her pelvis.

## 2017-09-23 NOTE — Telephone Encounter (Signed)
Pt called to speak with the provider or the nurse, she was put on emergency insulin for the last week since she need steroid for her leg that way she can walk, she was promise  She will received a call and so far nothing, please call her back, since is not appt available  With her pcp

## 2017-09-24 ENCOUNTER — Encounter: Payer: Self-pay | Admitting: Family Medicine

## 2017-09-24 ENCOUNTER — Ambulatory Visit: Payer: Medicaid Other | Attending: Family Medicine | Admitting: Family Medicine

## 2017-09-24 VITALS — BP 106/70 | HR 76 | Temp 98.2°F | Ht 64.0 in | Wt 194.2 lb

## 2017-09-24 DIAGNOSIS — Z7951 Long term (current) use of inhaled steroids: Secondary | ICD-10-CM | POA: Diagnosis not present

## 2017-09-24 DIAGNOSIS — Z79899 Other long term (current) drug therapy: Secondary | ICD-10-CM | POA: Diagnosis not present

## 2017-09-24 DIAGNOSIS — F419 Anxiety disorder, unspecified: Secondary | ICD-10-CM | POA: Insufficient documentation

## 2017-09-24 DIAGNOSIS — M1990 Unspecified osteoarthritis, unspecified site: Secondary | ICD-10-CM | POA: Diagnosis not present

## 2017-09-24 DIAGNOSIS — E119 Type 2 diabetes mellitus without complications: Secondary | ICD-10-CM | POA: Insufficient documentation

## 2017-09-24 DIAGNOSIS — Z881 Allergy status to other antibiotic agents status: Secondary | ICD-10-CM | POA: Insufficient documentation

## 2017-09-24 DIAGNOSIS — Z87440 Personal history of urinary (tract) infections: Secondary | ICD-10-CM | POA: Insufficient documentation

## 2017-09-24 DIAGNOSIS — Z880 Allergy status to penicillin: Secondary | ICD-10-CM | POA: Diagnosis not present

## 2017-09-24 DIAGNOSIS — Z8661 Personal history of infections of the central nervous system: Secondary | ICD-10-CM | POA: Diagnosis not present

## 2017-09-24 DIAGNOSIS — Z794 Long term (current) use of insulin: Secondary | ICD-10-CM | POA: Insufficient documentation

## 2017-09-24 DIAGNOSIS — F329 Major depressive disorder, single episode, unspecified: Secondary | ICD-10-CM | POA: Insufficient documentation

## 2017-09-24 DIAGNOSIS — Z9049 Acquired absence of other specified parts of digestive tract: Secondary | ICD-10-CM | POA: Diagnosis not present

## 2017-09-24 DIAGNOSIS — Z888 Allergy status to other drugs, medicaments and biological substances status: Secondary | ICD-10-CM | POA: Insufficient documentation

## 2017-09-24 DIAGNOSIS — M25551 Pain in right hip: Secondary | ICD-10-CM | POA: Diagnosis not present

## 2017-09-24 DIAGNOSIS — M797 Fibromyalgia: Secondary | ICD-10-CM | POA: Diagnosis not present

## 2017-09-24 DIAGNOSIS — I10 Essential (primary) hypertension: Secondary | ICD-10-CM | POA: Insufficient documentation

## 2017-09-24 LAB — GLUCOSE, POCT (MANUAL RESULT ENTRY): POC Glucose: 156 mg/dl — AB (ref 70–99)

## 2017-09-24 NOTE — Patient Instructions (Signed)

## 2017-09-24 NOTE — Progress Notes (Signed)
Subjective:  Patient ID: Amber Rose, female    DOB: 1976-02-12  Age: 41 y.o. MRN: [id number]  CC: Diabetes   HPI Amber Rose  is a 41 year old female with type 2 diabetes mellitus (A1c 6.1), hypertension, depression and anxiety, hydradenitis, tobacco abuse, fibromyalgia here for follow-up of right thigh pain.  She was seen by [facility] orthopedic urgent care for right hip pain recently and orthopedic referral was recommended because she had a 'tilted pelvis' and she was discharged with crutches and a Medrol Dosepak.  She brings in the CD of her imaging but we do not have the radiology report and she is requesting a referral to [facility] Her symptoms have improved but she is currently on a prednisone taper.  She was placed on NovoLog sliding scale due to the Medrol Dosepak she is currently taking.  She is requesting a Pneumovax shot as requested by her allergist whom she is currently seeing for facial edema.  Of note she noticed improvement in her facial edema while on huge doses of her prednisone but symptoms have started to return in the process of tapering down.  Past Medical History:  Diagnosis Date  . Anxiety   . Arthritis   . Chronic UTI   . Depression   . Diabetes mellitus without complication ([place])   . Fibromyalgia   . Hypertension   . Meningitis, viral   . Pancreatitis     Past Surgical History:  Procedure Laterality Date  . APPENDECTOMY  1990  . ceasarian  2002  . CHOLECYSTECTOMY  2011  . URINARY SURGERY     urethra sling, removal, and then revision 2016 (4 surgeries)    Allergies  Allergen Reactions  . Metformin And Related Nausea And Vomiting  . Penicillins   . Xanax [Alprazolam]   . Glyburide Other (See Comments)    "blood sugar dropped uncontrollably"  . Linagliptin Diarrhea and Other (See Comments)    Stomach pain, sinus infection  . Moxifloxacin Diarrhea     Outpatient Medications Prior to Visit  Medication Sig  Dispense Refill  . acyclovir (ZOVIRAX) 400 MG tablet Take 1 tablet (400 mg total) by mouth 2 (two) times daily. 60 tablet 2  . amLODipine (NORVASC) 10 MG tablet Take 1 tablet (10 mg total) by mouth daily. 90 tablet 3  . Blood Glucose Monitoring Suppl (ACCU-CHEK AVIVA PLUS) w/Device KIT USE AS DIRECTED DAILY. E11.9 1 kit 0  . buPROPion (WELLBUTRIN SR) 150 MG 12 hr tablet Take 1 tablet (150 mg total) daily by mouth. 30 tablet 1  . Exenatide ER (BYDUREON) 2 MG PEN INJECT 2 MG INTO THE SKIN ONCE A WEEK. 12 each 3  . fluticasone (FLONASE) 50 MCG/ACT nasal spray Place 2 sprays into both nostrils daily. 16 g 6  . glucose blood (ACCU-CHEK AVIVA) test strip Use as instructed daily 100 each 12  . insulin aspart (NOVOLOG FLEXPEN) 100 UNIT/ML FlexPen 0-12 units subcutaneously 3 times daily before meals 15 mL 1  . Insulin Pen Needle 31G X 5 MM MISC 1 each by Does not apply route 3 (three) times daily before meals. 90 each 1  . Lancet Devices (ACCU-CHEK SOFTCLIX) lancets Use as instructed daily. 1 each 5  . lisinopril (PRINIVIL,ZESTRIL) 20 MG tablet Take 1 tablet (20 mg total) by mouth daily. 30 tablet 2  . methocarbamol (ROBAXIN) 750 MG tablet TAKE 1 TABLET BY MOUTH 4 TIMES DAILY 60 tablet 0  . mupirocin ointment (BACTROBAN) 2 % Apply to AA bid  X 1 week 22 g 0  . nicotine (NICODERM CQ) 21 mg/24hr patch Place 1 patch (21 mg total) daily onto the skin. 28 patch 1  . omeprazole (PRILOSEC) 20 MG capsule Take 1 capsule (20 mg total) by mouth daily. 30 capsule 3  . traZODone (DESYREL) 100 MG tablet Take 1 tablet (100 mg total) by mouth at bedtime as needed for sleep. 30 tablet 3  . atorvastatin (LIPITOR) 40 MG tablet Take 1 tablet (40 mg total) by mouth daily. (Patient not taking: Reported on 09/24/2017) 30 tablet 3  . cetirizine (ZYRTEC) 10 MG tablet Take 1 tablet (10 mg total) daily by mouth. (Patient not taking: Reported on 09/15/2017) 30 tablet 6  . diazepam (VALIUM) 5 MG tablet Take 5 mg by mouth every 6 (six)  hours as needed for anxiety.    . DULoxetine (CYMBALTA) 60 MG capsule Take 1 capsule (60 mg total) by mouth daily. (Patient not taking: Reported on 09/15/2017) 30 capsule 3  . meloxicam (MOBIC) 7.5 MG tablet Take 1 tablet (7.5 mg total) by mouth daily. (Patient not taking: Reported on 09/24/2017) 30 tablet 1  . oxybutynin (DITROPAN) 5 MG tablet Take 1 tablet (5 mg total) by mouth 2 (two) times daily. (Patient not taking: Reported on 09/24/2017) 60 tablet 3  . podophyllum resin (PODOCON-25) 25 % SOLN Apply 1 mL topically once a week. To wart on L index finger (Patient not taking: Reported on 09/15/2017) 15 mL 0  . sertraline (ZOLOFT) 50 MG tablet Take by mouth.     No facility-administered medications prior to visit.     ROS Review of Systems  Constitutional: Negative for activity change, appetite change and fatigue.  HENT: Negative for congestion, sinus pressure and sore throat.   Eyes: Negative for visual disturbance.  Respiratory: Negative for cough, chest tightness, shortness of breath and wheezing.   Cardiovascular: Negative for chest pain and palpitations.  Gastrointestinal: Negative for abdominal distention, abdominal pain and constipation.  Endocrine: Negative for polydipsia.  Genitourinary: Negative for dysuria and frequency.  Musculoskeletal:       See hpi  Skin: Negative for rash.  Neurological: Negative for tremors, light-headedness and numbness.  Hematological: Does not bruise/bleed easily.  Psychiatric/Behavioral: Negative for behavioral problems.    Objective:  BP 106/70   Pulse 76   Temp 98.2 F (36.8 C) (Oral)   Ht '5\' 4"'  (1.626 m)   Wt 194 lb 3.2 oz (88.1 kg)   LMP 09/23/2017   SpO2 98%   BMI 33.33 kg/m   BP/Weight 09/24/2017 09/15/2017 93/90/3009  Systolic BP 233 007 622  Diastolic BP 70 70 82  Wt. (Lbs) 194.2 195.8 194.2  BMI 33.33 33.61 33.33      Physical Exam  Constitutional: She is oriented to person, place, and time. She appears well-developed  and well-nourished.  Cardiovascular: Normal rate, normal heart sounds and intact distal pulses.  No murmur heard. Pulmonary/Chest: Effort normal and breath sounds normal. She has no wheezes. She has no rales. She exhibits no tenderness.  Abdominal: Soft. Bowel sounds are normal. She exhibits no distension and no mass. There is no tenderness.  Musculoskeletal: She exhibits tenderness (slight TTP on palpation and ROM).  Neurological: She is alert and oriented to person, place, and time.  Skin: Skin is warm and dry.  Psychiatric: She has a normal mood and affect.    Lab Results  Component Value Date   HGBA1C 6.1 (H) 09/09/2017     Assessment & Plan:   1.  Diabetes mellitus without complication ([place]) Controlled with A1c of 6.1 Continue Bydureon Currently on Novolog sliding scale due to being on Medrol dose pack Pneumovax 23 unavailable in the clinic today - scheduled to return for nurse visit - POCT glucose (manual entry)  2. Pain of right hip joint She is currently on a medrol dose pack No radiology report available on file but she has her CD with her. - AMB referral to orthopedics   No orders of the defined types were placed in this encounter.   Follow-up: Return in about 3 months (around 12/23/2017) for Follow-up on diabetes mellitus.   Arnoldo Morale MD

## 2017-09-27 ENCOUNTER — Ambulatory Visit: Payer: Medicaid Other | Attending: Family Medicine

## 2017-09-27 DIAGNOSIS — Z23 Encounter for immunization: Secondary | ICD-10-CM | POA: Insufficient documentation

## 2017-09-27 NOTE — Progress Notes (Signed)
Pt arrived at the clinic to get pneumovax 23. Pt received vaccine in right arm.

## 2017-10-01 ENCOUNTER — Encounter: Payer: Self-pay | Admitting: Allergy & Immunology

## 2017-10-01 ENCOUNTER — Other Ambulatory Visit: Payer: Self-pay | Admitting: Internal Medicine

## 2017-10-01 DIAGNOSIS — M79651 Pain in right thigh: Secondary | ICD-10-CM

## 2017-10-01 DIAGNOSIS — M79652 Pain in left thigh: Principal | ICD-10-CM

## 2017-10-11 ENCOUNTER — Ambulatory Visit (INDEPENDENT_AMBULATORY_CARE_PROVIDER_SITE_OTHER): Payer: Medicaid Other | Admitting: Allergy & Immunology

## 2017-10-11 ENCOUNTER — Encounter: Payer: Self-pay | Admitting: Allergy & Immunology

## 2017-10-11 VITALS — BP 146/100 | HR 75 | Ht 64.0 in | Wt 198.2 lb

## 2017-10-11 DIAGNOSIS — B999 Unspecified infectious disease: Secondary | ICD-10-CM

## 2017-10-11 DIAGNOSIS — J341 Cyst and mucocele of nose and nasal sinus: Secondary | ICD-10-CM | POA: Diagnosis not present

## 2017-10-11 DIAGNOSIS — H938X3 Other specified disorders of ear, bilateral: Secondary | ICD-10-CM | POA: Diagnosis not present

## 2017-10-11 DIAGNOSIS — F17200 Nicotine dependence, unspecified, uncomplicated: Secondary | ICD-10-CM

## 2017-10-11 DIAGNOSIS — R1310 Dysphagia, unspecified: Secondary | ICD-10-CM

## 2017-10-11 DIAGNOSIS — T783XXD Angioneurotic edema, subsequent encounter: Secondary | ICD-10-CM | POA: Diagnosis not present

## 2017-10-11 MED ORDER — FEXOFENADINE HCL 180 MG PO TABS: 180.0000 mg | ORAL_TABLET | Freq: Every day | ORAL | 3 refills | Status: DC

## 2017-10-11 NOTE — Progress Notes (Addendum)
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 Angioedema - unknown trigger but responsive to antihistamines - Change to Allegra 180mg  twice daily since you have had problems with sleepiness from the Zyrtec. - We will send in a prescription for the Crisp. - Information on Xolair provided, which is where we might be headed in the future.  - I cannot explain this aside from the possibility that some of this pain was related to keep tissue leakage, which was mitigated by the addition of the antihistamine.   2. Ear fullness and right sided nasal ? cyst  - Continue with Flonase two sprays per nostril daily to help control inflammation in your nose and allow drainage of the Eustachian tube. - We will refer you to ENT for an evaluation.  3. Eye swelling - with dry eyes and enlargement of the lacrimal caruncle - Sample of eye drops provided (Pazeo one drop per eye daily). - We will see if this helps with the swellings on the sides of your eyes.   4. Dysphagia  - We will refer you to GI for an evaluation of the difficulty swallowing.    5. Return in about 2 months (around 12/12/2017). Come back in two weeks (10/25/17) for repeat labs.   Subjective:   Amber Rose is a 41 y.o. female presenting today for follow up of  Chief Complaint  Patient presents with  . Follow-up    Pt is here for follow up, Pt had pneumovax about 3 weeks ago and is here to have titers checked. Pt states she has been doing somewhat better, but has had a few  flares.    Amber Rose has a history of the following: Patient Active Problem List   Diagnosis Date Noted  . GERD without esophagitis 07/12/2017  . Degenerative disc disease at L5-S1 level 05/14/2017  . Fibromyalgia 05/14/2017  . ADD (attention deficit disorder) 05/14/2017  . Urinary incontinence 04/05/2017  . Diabetes mellitus without complication ([place]) 06/08/4817  . Thigh pain 02/02/2017  . Anxiety and depression 02/02/2017  . Tobacco abuse 02/02/2017  . Facial flushing 02/02/2017  . Hypertension 10/23/2016  . Hidradenitis 10/23/2016    History obtained from: chart review and patient.  Amber Rose's Primary Care Provider is Arnoldo Morale, MD.     Shakia is a 41 y.o. female presenting for a follow up visit. I last saw Amber Rose as a new patient in November 2018 as a New Patient. She has a complicated history and had multiple complaints during that visit, chiefly swelling of her bilateral periorbital regions. She had multiple pictures during the visit to show me. We deferred on repeat testing since she had two previous workups that were negative. We did obtain labs which ruled out HAE.  We also started suppressive dosing of antihistamines, which did seem to work well. She was taking cetirizine twice dialy and reported worsening fatigue. Therefore we changed to [place]. She was also endorsing ear fullness bilaterally, and we started fluticasone nasal spray two sprays per nostril daily to help with Eustachian tube dysfunction. She had oral thrush  on exam and we started Diflucan. She has a history of recurrent thrush, therefore we did an immune workup that demonstrated non-protective titers to Streptococcus pneumoniae, therefore she received a Pneumovax two weeks ago and will be obtaining a repeat titer in two more weeks.    Since the last visit, she has done well. She did report improvement in her swelling with the use of cetirizine BID. She continued to have flares, but  they were less frequent, less severe, and did not last as long. She was having some increased sedation from the cetirizine, therefore I recommended changing to Allegra. This has worked well. She is interested in changing to Xolair today.   She comes with some more family history. Evidently, she was allergic to multiple foods when she was younger. She was also on antibiotics for prolonged periods of time. One of her sons also has a history of food allergies and seasonal allergies as well.   Interestingly, she reports that her previous hip pain has decreased from a 10/10 at the last visit to a 7/10 now. She is wondering whether this can be attributed to her initiation of the antihistamine.   Otherwise, there have been no changes to her past medical history, surgical history, family history, or social history.    Review of Systems: a 14-point review of systems is pertinent for what is mentioned in HPI.  Otherwise, all other systems were negative. Constitutional: negative other than that listed in the HPI Eyes: negative other than that listed in the HPI Ears, nose, mouth, throat, and face: negative other than that listed in the HPI Respiratory: negative other than that listed in the HPI Cardiovascular: negative other than that listed in the HPI Gastrointestinal: negative other than that listed in the HPI Genitourinary: negative other than that listed in the HPI Integument: negative other than that listed in the HPI Hematologic: negative other than that listed in the HPI Musculoskeletal: negative other than that listed in the HPI Neurological: negative other than that listed in the HPI Allergy/Immunologic: negative other than that listed in the HPI    Objective:   Blood pressure (!) 146/100, pulse 75, height 5\' 4"  (1.626 m), weight 198 lb 3.2 oz (89.9 kg), last menstrual period 09/23/2017, SpO2 94 %. Body mass index is 34.02 kg/m.   Physical Exam:  General: Alert, interactive, in no  acute distress. Very pleasant. Looks less swollen than the las visit.  Eyes: No conjunctival injection present on the right, No conjunctival injection present on the left, No conjunctival injection bilaterally, no discharge on the left, no Horner-Trantas dots present and allergic shiners present bilaterally. PERRL bilaterally. EOMI without pain. No photophobia.  Ears: Right TM pearly gray with normal light reflex, Left TM pearly gray with normal light reflex, Right TM intact without perforation and Left TM intact without perforation.  Nose/Throat: External nose within normal limits, nasal crease present and septum midline. Turbinates edematous and pale with clear discharge. Posterior oropharynx mildly erythematous without cobblestoning in the posterior oropharynx. Tonsils 2+ without exudates.  Tongue without thrush. Adenopathy: no enlarged lymph nodes appreciated in the anterior cervical, occipital, axillary, epitrochlear, inguinal, or popliteal regions. Lungs: Clear to auscultation without wheezing, rhonchi or rales. No increased work of breathing. CV: Normal S1/S2. No murmurs. Capillary refill <2 seconds.  Skin: Warm and dry, without lesions or rashes. Neuro:   Grossly intact. No focal deficits appreciated. Responsive to questions.  Diagnostic studies: none     Salvatore Marvel, MD FAAAAI [facility]

## 2017-10-11 NOTE — Patient Instructions (Addendum)
1. Angioedema - unknown trigger but responsive to antihistamines - Change to Allegra 180mg  twice daily since you have had problems with sleepiness from the Zyrtec. - We will send in a prescription for the [place]. - Information on Xolair provided, which is where we might be headed in the future.   2. Ear fullness and right sided nasal ? cyst  - Continue with Flonase two sprays per nostril daily to help control inflammation in your nose and allow drainage of the Eustachian tube. - We will refer you to ENT for an evaluation.  3. Eye swelling - Sample of eye drops provided (Pazeo one drop per eye daily). - We will see if this helps with the swellings on the sides of your eyes.   4. Dysphagia  - We will refer you to GI for an evaluation of the difficulty swallowing.    5. Return in about 2 months (around 12/12/2017). Come back in two weeks (10/25/17) for repeat labs.    Please inform us of any Emergency Department visits, hospitalizations, or changes in symptoms. Call us before going to the ED for breathing or allergy symptoms since we might be able to fit you in for a sick visit. Feel free to contact us anytime with any questions, problems, or concerns.  It was a pleasure to see you again today! Enjoy the holiday season! Great job with [employer] education [employer] work!   Websites that have reliable patient information: 1. American Academy of Asthma, Allergy, and Immunology: www.aaaai.org 2. Food Allergy Research and Education (FARE): foodallergy.org 3. Mothers of Asthmatics: http://www.asthmacommunitynetwork.org 4. American College of Allergy, Asthma, and Immunology: www.acaai.org

## 2017-10-13 ENCOUNTER — Ambulatory Visit: Payer: Medicaid Other | Admitting: Diagnostic Neuroimaging

## 2017-10-13 ENCOUNTER — Encounter: Payer: Self-pay | Admitting: Allergy & Immunology

## 2017-10-13 ENCOUNTER — Encounter: Payer: Self-pay | Admitting: Diagnostic Neuroimaging

## 2017-10-13 VITALS — BP 105/68 | HR 80 | Wt 198.0 lb

## 2017-10-13 DIAGNOSIS — M791 Myalgia, unspecified site: Secondary | ICD-10-CM

## 2017-10-13 DIAGNOSIS — R202 Paresthesia of skin: Secondary | ICD-10-CM | POA: Diagnosis not present

## 2017-10-13 DIAGNOSIS — R220 Localized swelling, mass and lump, head: Secondary | ICD-10-CM

## 2017-10-13 DIAGNOSIS — R200 Anesthesia of skin: Secondary | ICD-10-CM | POA: Diagnosis not present

## 2017-10-13 NOTE — Progress Notes (Signed)
Referrals have been sent out to speciality providers

## 2017-10-13 NOTE — Progress Notes (Signed)
[facility]  PATIENT: Amber Rose DOB: 1975/11/05  REFERRING CLINICIAN: Raquel James, MD  HISTORY FROM: patient  REASON FOR VISIT: follow up    HISTORICAL  CHIEF COMPLAINT:  Chief Complaint  Patient presents with  . Numbness    rm 7, "no change in numbness, tingling"  . Follow-up    3 month    HISTORY OF PRESENT ILLNESS:   UPDATE (10/13/17, [facility]): Since last visit, doing about the same. No alleviating or aggravating factors. MRI brain and labs unremarkable. Doing better with facial swelling and hip pain on anti-histamines per allergist.   PRIOR HPI (07/07/17): 41 year old female here for evaluation of combination of symptoms including headaches, facial swelling, pain, brain fog, gait difficulty, swallowing difficulty, numbness and pain.  In 2009 patient had diagnosis of diabetes. Her hemoglobin A1c ranged from 8-9 at that time. She was also having intermittent numbness and tingling in her fingers, arms, legs, typically when she placed pressure on her extremities. Over time numbness and tingling became more constant.   In 2010 patient had viral meningitis and was hospitalized for several weeks. Also around the same time she was also diagnosed with gallstone pancreatitis treated with surgery.  In 2011 patient started to have unexplained neck and joint pain. She was evaluated by rheumatology who found no specific cause for symptoms. Patient did have history of positive lupus anticoagulant test but confirmatory testing was negative. She had a positive ANA as well but no specific autoimmune condition was found.  Patient also has had intermittent, position dependent facial swelling. When patient lays down for 30 or 45 minutes, she will develop facial swelling in her eyelids, face and cheeks. This seems to improve if she stands up or sits up. She has been tested for numerous allergies which have been negative.  Patient also have decreased strength in her right arm and  right leg. She feels decreased sensation in the right side.  Patient has had urinary tract infections and incontinence.  Patient has family history of multiple sclerosis in her paternal grandmother. Patient was concerned about similar diagnosis and PCP ordered MRI of the cervical, thoracic and lumbar spine which were negative. Patient has not had MRI of the brain yet.  Patient previously lived in [place] and more recently moved to [place]. I was able to review notes through care everywhere in [facility].   REVIEW OF SYSTEMS: Full 14 system review of systems performed and negative with exception of: Swelling bruising memory loss numbness weakness agitation depression suicidal thoughts neck pain neck stiffness rash aching muscles joint pain urinary urgency temperature intolerance chills fatigue ringing in ears because patient diarrhea snoring frequent waking eye pain blurred vision light sensitivity.   ALLERGIES: Allergies  Allergen Reactions  . Metformin And Related Nausea And Vomiting  . Penicillins     childhood  . Xanax [Alprazolam]   . Glyburide Other (See Comments)    "blood sugar dropped uncontrollably"  . Linagliptin Diarrhea and Other (See Comments)    Stomach pain, sinus infection  . Moxifloxacin Diarrhea    HOME MEDICATIONS: Outpatient Medications Prior to Visit  Medication Sig Dispense Refill  . acyclovir (ZOVIRAX) 400 MG tablet Take 1 tablet (400 mg total) by mouth 2 (two) times daily. 60 tablet 2  . amLODipine (NORVASC) 10 MG tablet Take 1 tablet (10 mg total) by mouth daily. 90 tablet 3  . atorvastatin (LIPITOR) 40 MG tablet Take 1 tablet (40 mg total) by mouth daily. 30 tablet 3  . diazepam (  VALIUM) 5 MG tablet Take 5 mg by mouth every 6 (six) hours as needed for anxiety.    . DULoxetine (CYMBALTA) 60 MG capsule Take 1 capsule (60 mg total) by mouth daily. 30 capsule 3  . Exenatide ER (BYDUREON) 2 MG PEN INJECT 2 MG INTO THE SKIN ONCE A WEEK. 12 each 3  .  fexofenadine (ALLEGRA) 180 MG tablet Take 1 tablet (180 mg total) by mouth daily. 30 tablet 3  . fluticasone (FLONASE) 50 MCG/ACT nasal spray Place 2 sprays into both nostrils daily. 16 g 6  . insulin aspart (NOVOLOG FLEXPEN) 100 UNIT/ML FlexPen 0-12 units subcutaneously 3 times daily before meals 15 mL 1  . Insulin Pen Needle 31G X 5 MM MISC 1 each by Does not apply route 3 (three) times daily before meals. 90 each 1  . lisinopril (PRINIVIL,ZESTRIL) 20 MG tablet Take 1 tablet (20 mg total) by mouth daily. 30 tablet 2  . meloxicam (MOBIC) 7.5 MG tablet Take 1 tablet (7.5 mg total) by mouth daily. 30 tablet 1  . methocarbamol (ROBAXIN) 750 MG tablet TAKE 1 TABLET BY MOUTH FOUR TIMES A DAY 60 tablet 0  . mupirocin ointment (BACTROBAN) 2 % Apply to AA bid X 1 week 22 g 0  . nicotine (NICODERM CQ) 21 mg/24hr patch Place 1 patch (21 mg total) daily onto the skin. 28 patch 1  . omeprazole (PRILOSEC) 20 MG capsule Take 1 capsule (20 mg total) by mouth daily. 30 capsule 3  . podophyllum resin (PODOCON-25) 25 % SOLN Apply 1 mL topically once a week. To wart on L index finger 15 mL 0  . sertraline (ZOLOFT) 50 MG tablet Take by mouth.    . traZODone (DESYREL) 100 MG tablet Take 1 tablet (100 mg total) by mouth at bedtime as needed for sleep. 30 tablet 3  . Blood Glucose Monitoring Suppl (ACCU-CHEK AVIVA PLUS) w/Device KIT USE AS DIRECTED DAILY. E11.9 (Patient not taking: Reported on 10/13/2017) 1 kit 0  . buPROPion (WELLBUTRIN SR) 150 MG 12 hr tablet Take 1 tablet (150 mg total) daily by mouth. 30 tablet 1  . cetirizine (ZYRTEC) 10 MG tablet Take 1 tablet (10 mg total) daily by mouth. (Patient not taking: Reported on 09/15/2017) 30 tablet 6  . glucose blood (ACCU-CHEK AVIVA) test strip Use as instructed daily (Patient not taking: Reported on 10/13/2017) 100 each 12  . Lancet Devices (ACCU-CHEK SOFTCLIX) lancets Use as instructed daily. (Patient not taking: Reported on 10/13/2017) 1 each 5  . oxybutynin  (DITROPAN) 5 MG tablet Take 1 tablet (5 mg total) by mouth 2 (two) times daily. (Patient not taking: Reported on 09/24/2017) 60 tablet 3   No facility-administered medications prior to visit.     PAST MEDICAL HISTORY: Past Medical History:  Diagnosis Date  . Anxiety   . Arthritis   . Chronic UTI   . Depression   . Diabetes mellitus without complication ([place])   . Fibromyalgia   . Hypertension   . Meningitis, viral   . Pancreatitis     PAST SURGICAL HISTORY: Past Surgical History:  Procedure Laterality Date  . APPENDECTOMY  1990  . ceasarian  2002  . CHOLECYSTECTOMY  2011  . URINARY SURGERY     urethra sling, removal, and then revision 2016 (4 surgeries)    FAMILY HISTORY: Family History  Problem Relation Age of Onset  . Cancer Mother        cervical, breast, lungm skin  . Diabetes Father   .  Multiple sclerosis Paternal Grandmother   . Allergic rhinitis Neg Hx   . Angioedema Neg Hx   . Asthma Neg Hx   . Eczema Neg Hx   . Urticaria Neg Hx     SOCIAL HISTORY:  Social History   Socioeconomic History  . Marital status: Single    Spouse name: Not on file  . Number of children: Not on file  . Years of education: Not on file  . Highest education level: Not on file  Social Needs  . Financial resource strain: Not on file  . Food insecurity - worry: Not on file  . Food insecurity - inability: Not on file  . Transportation needs - medical: Not on file  . Transportation needs - non-medical: Not on file  Occupational History  . Not on file  Tobacco Use  . Smoking status: Current Every Day Smoker    Packs/day: 1.00    Years: 20.00    Pack years: 20.00    Types: Cigarettes  . Smokeless tobacco: Never Used  Substance and Sexual Activity  . Alcohol use: Yes    Comment: socially  . Drug use: No    Comment: last week hx 07-07-17  . Sexual activity: Not on file  Other Topics Concern  . Not on file  Social History Narrative   Lives home with adult niece.  Not  working.  Disability pending.  Pt is single.  Education GED.  3 children.      PHYSICAL EXAM  GENERAL EXAM/CONSTITUTIONAL: Vitals:  Vitals:   10/13/17 1245  BP: 105/68  Pulse: 80  Weight: 198 lb (89.8 kg)   Body mass index is 33.99 kg/m. No exam data present  Patient is in no distress; well developed, nourished and groomed; neck is supple  CARDIOVASCULAR:  Examination of carotid arteries is normal; no carotid bruits  Regular rate and rhythm, no murmurs  Examination of peripheral vascular system by observation and palpation is normal  EYES:  Ophthalmoscopic exam of optic discs and posterior segments is normal; no papilledema or hemorrhages  MUSCULOSKELETAL:  Gait, strength, tone, movements noted in Neurologic exam below  NEUROLOGIC: MENTAL STATUS:  No flowsheet data found.  awake, alert, oriented to person, place and time  recent and remote memory intact  normal attention and concentration  language fluent, comprehension intact, naming intact,   fund of knowledge appropriate  CRANIAL NERVE:   2nd - no papilledema on fundoscopic exam  2nd, 3rd, 4th, 6th - pupils equal and reactive to light, visual fields full to confrontation, extraocular muscles intact, no nystagmus  5th - facial sensation symmetric  7th - facial strength symmetric  8th - hearing intact  9th - palate elevates symmetrically, uvula midline  11th - shoulder shrug symmetric  12th - tongue protrusion midline  MOTOR:   normal bulk and tone, full strength in the BUE, BLE; EXCEPT LIMITED IN RIGHT HF DUE TO PAIN  SENSORY:   normal and symmetric to light touch and vibration and temperature  COORDINATION:   finger-nose-finger, fine finger movements  REFLEXES:   deep tendon reflexes present and symmetric; TRACE AT ANKLES  GAIT/STATION:   narrow based gait; romberg is negative    DIAGNOSTIC DATA (LABS, IMAGING, TESTING) - I reviewed patient records, labs, notes, testing and  imaging myself where available.  Lab Results  Component Value Date   WBC 10.8 09/09/2017   HGB 12.8 09/09/2017   HCT 37.6 09/09/2017   MCV 88 09/09/2017   PLT 376 09/09/2017  Component Value Date/Time   NA 142 10/28/2016 0851   K 4.5 10/28/2016 0851   CL 110 10/28/2016 0851   CO2 23 10/28/2016 0851   GLUCOSE 95 10/28/2016 0851   BUN 13 10/28/2016 0851   CREATININE 0.64 10/28/2016 0851   CALCIUM 9.1 10/28/2016 0851   PROT 7.0 07/07/2017 1239   ALBUMIN 4.0 10/28/2016 0851   AST 11 10/28/2016 0851   ALT 9 10/28/2016 0851   ALKPHOS 75 10/28/2016 0851   BILITOT 0.2 10/28/2016 0851   GFRNONAA >89 10/28/2016 0851   GFRAA >89 10/28/2016 0851   Lab Results  Component Value Date   CHOL 198 10/28/2016   HDL 45 (L) 10/28/2016   TRIG 93 10/28/2016   CHOLHDL 4.4 10/28/2016   Lab Results  Component Value Date   HGBA1C 6.1 (H) 09/09/2017   No results found for: [id number] Lab Results  Component Value Date   TSH 1.28 12/25/2016     03/05/17 carotid u/s - Bilateral - 1% to 39% ICA senosis. vertebral artery flow is antegrade.  03/05/17 MRI cervical / thoracic / lumbar spine (without) [I reviewed images myself and agree with interpretation. -[facility]P]  - Negative for cord abnormality. Negative for cord compression, cord lesion, or demyelinating disease. - Degenerative changes in the cervical, thoracic, and lumbar spine as described above.  07/19/17 normal MRI of the brain with and without contrast. 1.    The brain appears normal before and after contrast. 2.    Minimal chronic inflammatory changes in the right maxillary sinus and some of the ethmoid air cells. 3.    There is a normal enhancement pattern and there are no acute findings      ASSESSMENT AND PLAN  42 y.o. year old female here with constellation of symptoms including headaches, unexplained facial swelling, chronic pain, numbness and tingling on the right side, weakness in the right side, gait difficulty,  swallowing difficulty brain fog, memory loss, depression and anxiety. Symptoms started around 2008 but have been progressive over the past 10 years.   H/o lupus anticoagulant + test x 1; follow up testing negative.  H/o positive ANA, but no specific autoimmune condition diagnosed.    Dx:  1. Numbness and tingling   2. Muscle pain   3. Facial swelling      PLAN:  NUMBNESS / PAIN / SWELLING / BRUISING - no neurologic etiology found - continue fibromyalgia treatments  FACIAL SWELLING - unclear etiology; follow up with PCP  DECONDITIONING / PAIN / DEPRESSION - encouraged improvements in nutrition, fitness and stress mgmt - follow up with psychiatry  Return if symptoms worsen or fail to improve, for return to PCP.     Penni Bombard, MD 75/79/7282, 06:01 PM Certified in Neurology, Neurophysiology and Neuroimaging  [facility] [address], [place], [place] [address] [phone]

## 2017-10-20 ENCOUNTER — Encounter: Payer: Self-pay | Admitting: Gastroenterology

## 2017-10-20 ENCOUNTER — Ambulatory Visit: Payer: Medicaid Other | Admitting: Podiatry

## 2017-10-21 ENCOUNTER — Telehealth: Payer: Self-pay | Admitting: Family Medicine

## 2017-10-22 ENCOUNTER — Ambulatory Visit: Payer: Medicaid Other | Admitting: Podiatry

## 2017-10-22 ENCOUNTER — Encounter: Payer: Self-pay | Admitting: Podiatry

## 2017-10-22 ENCOUNTER — Other Ambulatory Visit: Payer: Self-pay | Admitting: Podiatry

## 2017-10-22 ENCOUNTER — Ambulatory Visit: Payer: Self-pay

## 2017-10-22 DIAGNOSIS — M7750 Other enthesopathy of unspecified foot: Secondary | ICD-10-CM

## 2017-10-22 DIAGNOSIS — M79671 Pain in right foot: Secondary | ICD-10-CM

## 2017-10-22 DIAGNOSIS — M205X1 Other deformities of toe(s) (acquired), right foot: Secondary | ICD-10-CM | POA: Diagnosis not present

## 2017-10-22 DIAGNOSIS — M21371 Foot drop, right foot: Secondary | ICD-10-CM | POA: Diagnosis not present

## 2017-10-22 NOTE — Progress Notes (Signed)
Subjective:    Patient ID: Amber Rose, female    DOB: [birth year], 41 y.o.   MRN: [id number]  HPI this patient presents the office for evaluation of her diabetic feet.  She says that she has difficulty walking due to the pain that is present in her right foot.  She does admit that she has aching toes by the end of the day when she walks and numbness in these toes.  She also says that she has pain in the front of her right ankle.  . She gives a history of being diabetic.  She says she is also been evaluated for hip pain.  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  She says she has taken meloxicam but has been minimally effective.  . She says someone suggested that she have her feet examined and that the pain could be related to her foot and not her back.  She presents the office today for an evaluation and treatment of her right foot.   Chief Complaint  Patient presents with  . Diabetes    A1C  6.1  . Toe Pain    Bilateral, right worse than left  . Foot Pain    bilateral         Review of Systems     Objective:   Physical Exam General Appearance  Alert, conversant and in no acute stress.  Vascular  Dorsalis pedis and posterior pulses are palpable  bilaterally.  Capillary return is within normal limits  bilaterally. Temperature is within normal limits  Bilaterally.  Neurologic  Senn-Weinstein monofilament wire test within normal limits  bilaterally. Muscle power within normal limits except her dorsiflexors on her right foot are very weak.    Nails normal nails noted with no evidence of any bacterial or fungal infection.  Orthopedic  No limitations of motion of motion feet bilaterally.  No crepitus or effusions noted.  No bony pathology or digital deformities noted. Limited range of motion of the first MPJ of the right greater than the left.    Skin  normotropic skin with no porokeratosis noted bilaterally.  No  signs of infections or ulcers noted.     Dropfoot right  . Functional hallux limitus, right greater than the left.     IE  x-rays taken do reveal an elongated and elevated first metatarsal right foot.  Small degenerative changes noted at the head of the first metatarsal right foot.  , I discussed my findings with this patient.   I believe her weakness in her dorsiflexors are due to her back degeneration.  She is also having foot pain which leads to leg and back pathology due to a functional hallux limitus.  I discussed the dropfoot with the Liliane Channel and he suggested I picked up and OTS AFO Anterior Shell from [facility].  RTC 3 weeks for reevaluation.        Assessment & Plan:   . Functional hallux limitus, right greater than the left.  Dropfoot right foot.   IE  x-rays taken do reveal an elongated and elevated first metatarsal right foot.  Small degenerative changes noted at the head of the first metatarsal right foot.  , I discussed my findings with this patient.   I believe her weakness in her dorsiflexors are due to her back degeneration.  She is also having foot pain which leads to leg and back pathology due to a functional hallux  limitus.  I discussed the dropfoot with the Liliane Channel and he suggested the patient  pick up an OTS AFO Anterior Shell from [facility].  RTC 3 weeks for reevaluation.   Gardiner Barefoot DPM

## 2017-10-28 LAB — STREP PNEUMONIAE 23 SEROTYPES IGG
Pneumo Ab Type 1*: 3.7 ug/mL (ref >1.3)
Pneumo Ab Type 12 (12F)*: 0.2 ug/mL — ABNORMAL LOW (ref >1.3)
Pneumo Ab Type 14*: 5.3 ug/mL (ref >1.3)
Pneumo Ab Type 17 (17F)*: 6.6 ug/mL (ref >1.3)
Pneumo Ab Type 19 (19F)*: 7.6 ug/mL (ref >1.3)
Pneumo Ab Type 2*: >35.6 ug/mL (ref >1.3)
Pneumo Ab Type 20*: 18 ug/mL (ref >1.3)
Pneumo Ab Type 22 (22F)*: 2.8 ug/mL (ref >1.3)
Pneumo Ab Type 23 (23F)*: 0.3 ug/mL — ABNORMAL LOW (ref >1.3)
Pneumo Ab Type 26 (6B)*: 0.9 ug/mL — ABNORMAL LOW (ref >1.3)
Pneumo Ab Type 3*: 9.7 ug/mL (ref >1.3)
Pneumo Ab Type 34 (10A)*: 0.2 ug/mL — ABNORMAL LOW (ref >1.3)
Pneumo Ab Type 4*: 0.2 ug/mL — ABNORMAL LOW (ref >1.3)
Pneumo Ab Type 43 (11A)*: 1.5 ug/mL (ref >1.3)
Pneumo Ab Type 5*: 0.4 ug/mL — ABNORMAL LOW (ref >1.3)
Pneumo Ab Type 51 (7F)*: 3.4 ug/mL (ref >1.3)
Pneumo Ab Type 54 (15B)*: 5 ug/mL (ref >1.3)
Pneumo Ab Type 56 (18C)*: 4.2 ug/mL (ref >1.3)
Pneumo Ab Type 57 (19A)*: 3.3 ug/mL (ref >1.3)
Pneumo Ab Type 68 (9V)*: >16.4 ug/mL (ref >1.3)
Pneumo Ab Type 70 (33F)*: 6 ug/mL (ref >1.3)
Pneumo Ab Type 8*: >25.3 ug/mL (ref >1.3)
Pneumo Ab Type 9 (9N)*: 1 ug/mL — ABNORMAL LOW (ref >1.3)

## 2017-10-28 LAB — B-CELL MEMORY AND NAIVE PANEL
B-cells % CD19: 17 % (ref 5–26)
B-cells Absolute CD19: 267 cells/uL (ref 58–558)
Class-switched Abs: 20 cells/uL (ref 4–62)
Class-switched Memory %: 8 % (ref 3–23)
IgM Only Memory %: 0.3 % (ref 0.3–6.0)
IgM Only Memory Abs: 0.8 cells/uL (ref 0.6–16.4)
Naive B-cell %CD19+/CD27-/IgD+: 76 % (ref 29–93)
Naive BCL Abs CD19+/CD27-/IgD+: 204 cells/uL (ref 22–423)
Non-switch Abs: 29 cells/uL (ref 4–66)
Non-switched Memory %: 11 % (ref 2–25)
Tot Mem BCL Absol CD19+/CD27+: 50 cells/uL (ref 13–148)
Total Memory B-cell%CD19/CD27+: 19 % (ref 7–48)

## 2017-10-30 ENCOUNTER — Other Ambulatory Visit: Payer: Self-pay | Admitting: Family Medicine

## 2017-10-30 DIAGNOSIS — M79652 Pain in left thigh: Principal | ICD-10-CM

## 2017-10-30 DIAGNOSIS — M79651 Pain in right thigh: Secondary | ICD-10-CM

## 2017-11-01 ENCOUNTER — Ambulatory Visit
Admission: RE | Admit: 2017-11-01 | Discharge: 2017-11-01 | Disposition: A | Discharge status: Home / Self Care | Payer: Medicaid Other | Source: Ambulatory Visit | Attending: Family Medicine | Admitting: Family Medicine

## 2017-11-01 ENCOUNTER — Ambulatory Visit: Payer: Self-pay

## 2017-11-01 ENCOUNTER — Telehealth: Payer: Self-pay | Admitting: Allergy & Immunology

## 2017-11-01 DIAGNOSIS — Z1239 Encounter for other screening for malignant neoplasm of breast: Secondary | ICD-10-CM

## 2017-11-01 NOTE — Telephone Encounter (Signed)
I have informed patient that the results are back but Dr. Ernst Bowler has not reviewed them. I assured her that once Dr. Ernst Bowler reviewed the labs someone from our office would call her back.

## 2017-11-01 NOTE — Telephone Encounter (Signed)
Pt was calling to see if her labs have come back [phone].

## 2017-11-03 ENCOUNTER — Other Ambulatory Visit: Payer: Self-pay | Admitting: Family Medicine

## 2017-11-03 ENCOUNTER — Encounter: Payer: Self-pay | Admitting: Gastroenterology

## 2017-11-03 ENCOUNTER — Ambulatory Visit: Payer: Medicaid Other | Admitting: Gastroenterology

## 2017-11-03 ENCOUNTER — Ambulatory Visit: Payer: Medicaid Other | Admitting: Allergy & Immunology

## 2017-11-03 VITALS — BP 142/86 | HR 78 | Ht 64.0 in | Wt 197.0 lb

## 2017-11-03 DIAGNOSIS — R1013 Epigastric pain: Secondary | ICD-10-CM | POA: Diagnosis not present

## 2017-11-03 DIAGNOSIS — R1310 Dysphagia, unspecified: Secondary | ICD-10-CM

## 2017-11-03 DIAGNOSIS — K219 Gastro-esophageal reflux disease without esophagitis: Secondary | ICD-10-CM

## 2017-11-03 DIAGNOSIS — R928 Other abnormal and inconclusive findings on diagnostic imaging of breast: Secondary | ICD-10-CM

## 2017-11-03 MED ORDER — OMEPRAZOLE 40 MG PO CPDR: 40.0000 mg | DELAYED_RELEASE_CAPSULE | Freq: Every day | ORAL | 3 refills | Status: DC

## 2017-11-03 NOTE — Progress Notes (Addendum)
11/03/2017 Amber Rose [id number] 1976-03-17   HISTORY OF PRESENT ILLNESS: This is a 42 year old female who is new to our office.  She was referred here by her PCP, Dr. Jarold Song, for evaluation of dysphasia.  The patient tells me that she just moved here from the [place] area a few months ago.  She tells me that she has seen GI in the past and underwent EGD a few years ago and believes that her esophagus was dilated at that time.  She also had a colonoscopy around the same time as well that she believes is normal.  Anyway, she comes in today with dysphasia that she says has been off and on again for a couple of years, but has been getting much more consistent.  This issue with swallowing occurs with both solid food and liquids.  She complains of epigastric abdominal pain that feels like the food is sticking in that area.  Complains of heartburn or reflux with acid taste in her mouth.  She has not been on any PPI recently, but admits that she was on Prilosec in the past that did seem to help with her acid reflux issues.  Complains of bloating and pressure throughout her abdomen.  She is insulin-dependent diabetic and has been having issues controlling her blood sugar, but has not yet seen an endocrinologist.  She does admit that when she had her last EGD if they did in fact dilate her esophagus that it did not help very much.  She admits to some nausea, but no vomiting.  Denies weight loss, has actually gained some weight.  She has really not been using any medications for her GI symptoms lately and it has been probably over a year since she has taking anything regularly.   Past Medical History:  Diagnosis Date  . Anxiety   . Arthritis   . Chronic UTI   . Depression   . Diabetes mellitus without complication ([place])   . Fibromyalgia   . GERD (gastroesophageal reflux disease)   . Hypertension   . Meningitis, viral   . Pancreatitis    Past Surgical History:  Procedure Laterality Date  .  APPENDECTOMY  1990  . ceasarian  2002  . CHOLECYSTECTOMY  2011  . URINARY SURGERY     urethra sling, removal, and then revision 2016 (4 surgeries)    reports that she has been smoking cigarettes.  She has a 20.00 pack-year smoking history. she has never used smokeless tobacco. She reports that she drinks alcohol. She reports that she does not use drugs. family history includes Cancer in her mother; Diabetes in her father; Multiple sclerosis in her paternal grandmother. Allergies  Allergen Reactions  . Metformin And Related Nausea And Vomiting  . Penicillins     childhood  . Xanax [Alprazolam]   . Glyburide Other (See Comments)    "blood sugar dropped uncontrollably"  . Linagliptin Diarrhea and Other (See Comments)    Stomach pain, sinus infection  . Moxifloxacin Diarrhea      Outpatient Encounter Medications as of 11/03/2017  Medication Sig  . acyclovir (ZOVIRAX) 400 MG tablet Take 1 tablet (400 mg total) by mouth 2 (two) times daily.  Marland Kitchen amLODipine (NORVASC) 10 MG tablet Take 1 tablet (10 mg total) by mouth daily.  Marland Kitchen atorvastatin (LIPITOR) 40 MG tablet Take 1 tablet (40 mg total) by mouth daily.  . Blood Glucose Monitoring Suppl (ACCU-CHEK AVIVA PLUS) w/Device KIT USE AS DIRECTED DAILY. E11.9 (Patient not taking: Reported  on 10/13/2017)  . buPROPion (WELLBUTRIN SR) 150 MG 12 hr tablet Take 1 tablet (150 mg total) daily by mouth.  . cetirizine (ZYRTEC) 10 MG tablet Take 1 tablet (10 mg total) daily by mouth. (Patient not taking: Reported on 09/15/2017)  . diazepam (VALIUM) 5 MG tablet Take 5 mg by mouth every 6 (six) hours as needed for anxiety.  . DULoxetine (CYMBALTA) 60 MG capsule Take 1 capsule (60 mg total) by mouth daily.  . Exenatide ER (BYDUREON) 2 MG PEN INJECT 2 MG INTO THE SKIN ONCE A WEEK.  . fexofenadine (ALLEGRA) 180 MG tablet Take 1 tablet (180 mg total) by mouth daily.  . fluticasone (FLONASE) 50 MCG/ACT nasal spray Place 2 sprays into both nostrils daily.  Marland Kitchen glucose  blood (ACCU-CHEK AVIVA) test strip Use as instructed daily (Patient not taking: Reported on 10/13/2017)  . insulin aspart (NOVOLOG FLEXPEN) 100 UNIT/ML FlexPen 0-12 units subcutaneously 3 times daily before meals  . Insulin Pen Needle 31G X 5 MM MISC 1 each by Does not apply route 3 (three) times daily before meals.  [name] Devices (ACCU-CHEK SOFTCLIX) lancets Use as instructed daily. (Patient not taking: Reported on 10/13/2017)  . lisinopril (PRINIVIL,ZESTRIL) 20 MG tablet Take 1 tablet (20 mg total) by mouth daily.  . meloxicam (MOBIC) 7.5 MG tablet Take 1 tablet (7.5 mg total) by mouth daily.  . methocarbamol (ROBAXIN) 750 MG tablet TAKE 1 TABLET BY MOUTH FOUR TIMES A DAY  . mupirocin ointment (BACTROBAN) 2 % Apply to AA bid X 1 week  . nicotine (NICODERM CQ) 21 mg/24hr patch Place 1 patch (21 mg total) daily onto the skin.  Marland Kitchen omeprazole (PRILOSEC) 20 MG capsule Take 1 capsule (20 mg total) by mouth daily.  Marland Kitchen oxybutynin (DITROPAN) 5 MG tablet Take 1 tablet (5 mg total) by mouth 2 (two) times daily. (Patient not taking: Reported on 09/24/2017)  . podophyllum resin (PODOCON-25) 25 % SOLN Apply 1 mL topically once a week. To wart on L index finger  . sertraline (ZOLOFT) 50 MG tablet Take by mouth.  . traZODone (DESYREL) 100 MG tablet Take 1 tablet (100 mg total) by mouth at bedtime as needed for sleep.   No facility-administered encounter medications on file as of 11/03/2017.      REVIEW OF SYSTEMS  : All other systems reviewed and negative except where noted in the History of Present Illness.   PHYSICAL EXAM: BP (!) 142/86   Pulse 78   Ht '5\' 4"'  (1.626 m)   Wt 197 lb (89.4 kg)   LMP 10/26/2017   BMI 33.81 kg/m  General: Well developed female in no acute distress Head: Normocephalic and atraumatic Eyes:  Sclerae anicteric, conjunctiva pink. Ears: Normal auditory acuity Lungs: Clear throughout to auscultation; no increased WOB. Heart: Regular rate and rhythm; no M/R/G. Abdomen:  Soft, non-distended.  Normal bowel sounds.  Mild epigastric TTP.  Musculoskeletal: Symmetrical with no gross deformities  Skin: No lesions on visible extremities Extremities: No edema  Neurological: Alert oriented x 4, grossly non-focal Psychological:  Alert and cooperative. Normal mood and affect  ASSESSMENT AND PLAN: *Dysphagia, epigastric abdominal pain, GERD, bloating:  Reports having EGD with dilation in the past, a few years ago.  We will schedule for EGD with Dr. Hilarie Fredrickson, but I am going to order an esophagram first as well since I am not quite sure how reliable her history is and we do not have records currently.  Also dysphagia is to both solids and liquids  and she reports that if they did dilate her esophagus previously that it did not help much.  Has not been on PPI so will start omeprazole 40 mg daily. *IDDM:  Insulin will be adjusted prior to endoscopic procedure per protocol. Will resume normal dosing after procedure.  **We are trying to request records from her previous GI in [place].   CC:  Valentina Shaggy, *  Addendum: Reviewed and agree with initial management. Pyrtle, Lajuan Lines, MD

## 2017-11-03 NOTE — Patient Instructions (Signed)
We have sent the following medications to your pharmacy for you to pick up at your convenience: Omeprazole 40 mg daily   You have been scheduled for an endoscopy. Please follow written instructions given to you at your visit today. If you use inhalers (even only as needed), please bring them with you on the day of your procedure. Your physician has requested that you go to www.startemmi.com and enter the access code given to you at your visit today. This web site gives a general overview about your procedure. However, you should still follow specific instructions given to you by our office regarding your preparation for the procedure.    You have been scheduled for a Barium Esophogram at [facility] (1st floor of the hospital) on 11-11-17 at 10:30 am. Please arrive 15 minutes prior to your appointment for registration. Make certain not to have anything to eat or drink 6 hours prior to your test. If you need to reschedule for any reason, please contact radiology at [phone] to do so. __________________________________________________________________ A barium swallow is an examination that concentrates on views of the esophagus. This tends to be a double contrast exam (barium and two liquids which, when combined, create a gas to distend the wall of the oesophagus) or single contrast (non-ionic iodine based). The study is usually tailored to your symptoms so a good history is essential. Attention is paid during the study to the form, structure and configuration of the esophagus, looking for functional disorders (such as aspiration, dysphagia, achalasia, motility and reflux) EXAMINATION You may be asked to change into a gown, depending on the type of swallow being performed. A radiologist and radiographer will perform the procedure. The radiologist will advise you of the type of contrast selected for your procedure and direct you during the exam. You will be asked to stand, sit or lie in  several different positions and to hold a small amount of fluid in your mouth before being asked to swallow while the imaging is performed .In some instances you may be asked to swallow barium coated marshmallows to assess the motility of a solid food bolus. The exam can be recorded as a digital or video fluoroscopy procedure. POST PROCEDURE It will take 1-2 days for the barium to pass through your system. To facilitate this, it is important, unless otherwise directed, to increase your fluids for the next 24-48hrs and to resume your normal diet.  This test typically takes about 30 minutes to perform. __________________________________________________________________________________

## 2017-11-04 ENCOUNTER — Encounter: Payer: Self-pay | Admitting: Gastroenterology

## 2017-11-04 DIAGNOSIS — R1310 Dysphagia, unspecified: Secondary | ICD-10-CM | POA: Insufficient documentation

## 2017-11-04 DIAGNOSIS — R1013 Epigastric pain: Secondary | ICD-10-CM | POA: Insufficient documentation

## 2017-11-05 ENCOUNTER — Ambulatory Visit
Admission: RE | Admit: 2017-11-05 | Discharge: 2017-11-05 | Disposition: A | Discharge status: Home / Self Care | Payer: Medicaid Other | Source: Ambulatory Visit | Attending: Family Medicine | Admitting: Family Medicine

## 2017-11-05 DIAGNOSIS — R928 Other abnormal and inconclusive findings on diagnostic imaging of breast: Secondary | ICD-10-CM

## 2017-11-11 ENCOUNTER — Ambulatory Visit (HOSPITAL_COMMUNITY)
Admission: RE | Admit: 2017-11-11 | Discharge: 2017-11-11 | Disposition: A | Discharge status: Home / Self Care | Payer: Medicaid Other | Source: Ambulatory Visit | Attending: Gastroenterology | Admitting: Gastroenterology

## 2017-11-11 DIAGNOSIS — R1310 Dysphagia, unspecified: Secondary | ICD-10-CM | POA: Diagnosis present

## 2017-11-11 DIAGNOSIS — K449 Diaphragmatic hernia without obstruction or gangrene: Secondary | ICD-10-CM | POA: Insufficient documentation

## 2017-11-11 DIAGNOSIS — R1013 Epigastric pain: Secondary | ICD-10-CM

## 2017-11-11 DIAGNOSIS — K219 Gastro-esophageal reflux disease without esophagitis: Secondary | ICD-10-CM

## 2017-11-16 ENCOUNTER — Encounter: Payer: Self-pay | Admitting: Internal Medicine

## 2017-11-16 ENCOUNTER — Ambulatory Visit (AMBULATORY_SURGERY_CENTER): Payer: Medicaid Other | Admitting: Internal Medicine

## 2017-11-16 ENCOUNTER — Other Ambulatory Visit: Payer: Self-pay

## 2017-11-16 VITALS — BP 124/76 | HR 72 | Temp 97.7°F | Resp 21 | Ht 64.0 in | Wt 197.0 lb

## 2017-11-16 DIAGNOSIS — K219 Gastro-esophageal reflux disease without esophagitis: Secondary | ICD-10-CM

## 2017-11-16 DIAGNOSIS — K228 Other specified diseases of esophagus: Secondary | ICD-10-CM | POA: Diagnosis not present

## 2017-11-16 MED ORDER — SODIUM CHLORIDE 0.9 % IV SOLN: 500.0000 mL | Freq: Once | INTRAVENOUS | Status: DC

## 2017-11-16 NOTE — Op Note (Signed)
[facility] Patient Name: Amber Rose Procedure Date: 11/16/2017 9:33 AM MRN: [id number] Endoscopist: Jerene Bears , MD Age: 42 Referring MD:  Date of Birth: 1976/05/20 Gender: Female Account #: [id number] Procedure:                Upper GI endoscopy Indications:              Epigastric abdominal pain, Dysphagia,                            Gastro-esophageal reflux disease, Abdominal bloating Medicines:                Monitored Anesthesia Care Procedure:                Pre-Anesthesia Assessment:                           - Prior to the procedure, a History and Physical                            was performed, and patient medications and                            allergies were reviewed. The patient's tolerance of                            previous anesthesia was also reviewed. The risks                            and benefits of the procedure and the sedation                            options and risks were discussed with the patient.                            All questions were answered, and informed consent                            was obtained. Prior Anticoagulants: The patient has                            taken no previous anticoagulant or antiplatelet                            agents. ASA Grade Assessment: II - A patient with                            mild systemic disease. After reviewing the risks                            and benefits, the patient was deemed in                            satisfactory condition to undergo the procedure.  After obtaining informed consent, the endoscope was                            passed under direct vision. Throughout the                            procedure, the patient's blood pressure, pulse, and                            oxygen saturations were monitored continuously. The                            Endoscope was introduced through the mouth, and                            advanced to  the second part of duodenum. The upper                            GI endoscopy was accomplished without difficulty.                            The patient tolerated the procedure well. Scope In: Scope Out: Findings:                 A 2 cm hiatal hernia was present.                           Normal mucosa was found in the entire esophagus.                            Biopsies were obtained from the proximal and distal                            esophagus with cold forceps for histology of                            suspected eosinophilic esophagitis.                           A large amount of food (residue) was found in the                            gastric body consistent with gastroparesis.                           The stomach mucosa not obscured by retained                            food/fluid was normal. Exam limited by food/fluid.                           The examined duodenum was normal. Complications:            No immediate complications. Estimated Blood Loss:     Estimated blood loss was minimal. Impression:               -  2 cm hiatal hernia.                           - Normal mucosa was found in the entire esophagus.                            Biopsied.                           - A large amount of food (residue) in the stomach                            consistent with gastroparesis.                           - Normal mucosa was found in the stomach.                           - Normal examined duodenum. Recommendation:           - Patient has a contact number available for                            emergencies. The signs and symptoms of potential                            delayed complications were discussed with the                            patient. Return to normal activities tomorrow.                            Written discharge instructions were provided to the                            patient.                           - Gastroparesis diet (Level 3).                            - Continue present medications.                           - Await pathology results.                           - Office follow-up next available with me or in                            about 1-2 months with Alonza Bogus, PA-C (if                            appointments with me not available). Jerene Bears, MD 11/16/2017 9:57:13 AM This report has been signed electronically.

## 2017-11-16 NOTE — Progress Notes (Signed)
Report given to PACU, vss 

## 2017-11-16 NOTE — Progress Notes (Signed)
Called to room to assist during endoscopic procedure.  Patient ID and intended procedure confirmed with present staff. Received instructions for my participation in the procedure from the performing physician.  

## 2017-11-16 NOTE — Patient Instructions (Signed)
YOU HAD AN ENDOSCOPIC PROCEDURE TODAY AT [facility]:   Refer to the procedure report that was given to you for any specific questions about what was found during the examination.  If the procedure report does not answer your questions, please call your gastroenterologist to clarify.  If you requested that your care partner not be given the details of your procedure findings, then the procedure report has been included in a sealed envelope for you to review at your convenience later.  YOU SHOULD EXPECT: Some feelings of bloating in the abdomen. Passage of more gas than usual.  Walking can help get rid of the air that was put into your GI tract during the procedure and reduce the bloating. If you had a lower endoscopy (such as a colonoscopy or flexible sigmoidoscopy) you may notice spotting of blood in your stool or on the toilet paper. If you underwent a bowel prep for your procedure, you may not have a normal bowel movement for a few days.  Please Note:  You might notice some irritation and congestion in your nose or some drainage.  This is from the oxygen used during your procedure.  There is no need for concern and it should clear up in a day or so.  SYMPTOMS TO REPORT IMMEDIATELY:    Following upper endoscopy (EGD)  Vomiting of blood or coffee ground material  New chest pain or pain under the shoulder blades  Painful or persistently difficult swallowing  New shortness of breath  Fever of 100F or higher  Black, tarry-looking stools  For urgent or emergent issues, a gastroenterologist can be reached at any hour by calling [phone].   DIET:  We do recommend a small meal at first, but then you may proceed to your regular diet.  Drink plenty of fluids but you should avoid alcoholic beverages for 24 hours.  ACTIVITY:  You should plan to take it easy for the rest of today and you should NOT DRIVE or use heavy machinery until tomorrow (because of the sedation medicines used  during the test).    FOLLOW UP: Our staff will call the number listed on your records the next business day following your procedure to check on you and address any questions or concerns that you may have regarding the information given to you following your procedure. If we do not reach you, we will leave a message.  However, if you are feeling well and you are not experiencing any problems, there is no need to return our call.  We will assume that you have returned to your regular daily activities without incident.  If any biopsies were taken you will be contacted by phone or by letter within the next 1-3 weeks.  Please call us at [phone] if you have not heard about the biopsies in 3 weeks.    SIGNATURES/CONFIDENTIALITY: You and/or your care partner have signed paperwork which will be entered into your electronic medical record.  These signatures attest to the fact that that the information above on your After Visit Summary has been reviewed and is understood.  Full responsibility of the confidentiality of this discharge information lies with you and/or your care-partner.  Hiatal hernia.  See Dr. Hilarie Fredrickson next available appointment, or follow up with Alonza Bogus in 1-2 months. Gastroparesis diet given with instructions.

## 2017-11-17 ENCOUNTER — Telehealth: Payer: Self-pay

## 2017-11-17 NOTE — Telephone Encounter (Signed)
  Follow up Call-  Call back number 11/16/2017  Post procedure Call Back phone  # #[phone] cell  Permission to leave phone message Yes  Some recent data might be hidden     Patient questions:  Do you have a fever, pain , or abdominal swelling? No. Pain Score  0 *  Have you tolerated food without any problems? Yes.    Have you been able to return to your normal activities? Yes.    Do you have any questions about your discharge instructions: Diet   No. Medications  No. Follow up visit  No.  Do you have questions or concerns about your Care? No.  Actions: * If pain score is 4 or above: No action needed, pain <4.

## 2017-11-18 ENCOUNTER — Encounter: Payer: Self-pay | Admitting: Allergy & Immunology

## 2017-11-18 ENCOUNTER — Other Ambulatory Visit: Payer: Self-pay | Admitting: Family Medicine

## 2017-11-18 ENCOUNTER — Ambulatory Visit: Payer: Medicaid Other | Admitting: Allergy & Immunology

## 2017-11-18 VITALS — BP 120/82 | HR 81 | Resp 17

## 2017-11-18 DIAGNOSIS — H938X3 Other specified disorders of ear, bilateral: Secondary | ICD-10-CM | POA: Diagnosis not present

## 2017-11-18 DIAGNOSIS — M79651 Pain in right thigh: Secondary | ICD-10-CM

## 2017-11-18 DIAGNOSIS — R1310 Dysphagia, unspecified: Secondary | ICD-10-CM

## 2017-11-18 DIAGNOSIS — T783XXD Angioneurotic edema, subsequent encounter: Secondary | ICD-10-CM

## 2017-11-18 DIAGNOSIS — M79652 Pain in left thigh: Principal | ICD-10-CM

## 2017-11-18 DIAGNOSIS — B999 Unspecified infectious disease: Secondary | ICD-10-CM

## 2017-11-18 DIAGNOSIS — J341 Cyst and mucocele of nose and nasal sinus: Secondary | ICD-10-CM

## 2017-11-18 DIAGNOSIS — F17200 Nicotine dependence, unspecified, uncomplicated: Secondary | ICD-10-CM | POA: Diagnosis not present

## 2017-11-18 DIAGNOSIS — R600 Localized edema: Secondary | ICD-10-CM

## 2017-11-18 NOTE — Patient Instructions (Addendum)
1. Angioedema - unknown trigger but responsive to antihistamines - Change to Allegra 180mg  twice daily since you have had problems with sleepiness from the Zyrtec. - I would like to go ahead and start Xolair to help with the swelling episodes.  - We will work on getting this approved.   2. Ear fullness and right sided nasal ? cyst  - Continue with Flonase two sprays per nostril daily to help control inflammation in your nose and allow drainage of the Eustachian tube. - We will refer you to ENT for an evaluation by another provider.   3. Dysphagia  - Continue to follow with the GI doctor.  4. Return in about 3 months (around 02/16/2018).   Please inform us of any Emergency Department visits, hospitalizations, or changes in symptoms. Call us before going to the ED for breathing or allergy symptoms since we might be able to fit you in for a sick visit. Feel free to contact us anytime with any questions, problems, or concerns.  It was a pleasure to see you again today! Happy New Year!   Websites that have reliable patient information: 1. American Academy of Asthma, Allergy, and Immunology: www.aaaai.org 2. Food Allergy Research and Education (FARE): foodallergy.org 3. Mothers of Asthmatics: http://www.asthmacommunitynetwork.org 4. American College of Allergy, Asthma, and Immunology: www.acaai.org

## 2017-11-18 NOTE — Progress Notes (Signed)
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 Thyroid and lupus are main etiologies that come to mind with this, but she has had negative workup for this in the past, most recently in May 2018. Dermatomyositis can present with periorbital edema, therefore we ill get some labs to look into this She had a negative lupus anticoagulant, thyroid studies, and even ANCA panel and ACE level. She did have a positive ANA in the distant past in June 2015. 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 Another syndrome that can present with periorbital edema is IgG4-related disease, therefore we will obtain IgG subclasses. She is being followed by Amber [facility] at [facility].   1. Angioedema - unknown trigger but responsive to antihistamines - Change to Allegra 180mg  twice daily since you have had problems with sleepiness from the Zyrtec. - I would like to go ahead and start Xolair to help with the swelling episodes.  - We will work on getting this approved.  - We will obtain some additional rheumatologic labs to complete her workup, as highlighted above.  2. Concern for immunodeficiency - Immunodeficiency has been normal, and she responded quite nicely to the Pneumovax. - Her infectious history has lately been fairly unremarkable.  - I do not see  a need for more workup at this time.    3. Ear fullness and right sided nasal ? cyst  - Continue with Flonase two sprays per nostril daily to help control inflammation in your nose and allow drainage of the Eustachian tube. - We will refer you to ENT for an evaluation by another provider for an evaluation of the cyst.   4. Dysphagia  - Continue to follow with the GI doctor. - We will follow up on those biopsy results.   4. Return in about 3 months (around 02/16/2018).   Subjective:   Amber Rose is a 42 y.o. female presenting today for follow up of  Chief Complaint  Patient presents with  . Angioedema    taking Zyrtec. Unsure if it is providing relief. Woke up this morning with facial swelling and blurred vision  . Other    Discuss referral results from GI.     Amber Rose has a history of the following: Patient Active Problem List   Diagnosis Date Noted  . Abdominal pain, epigastric 11/04/2017  . Dysphagia 11/04/2017  . Gastroesophageal reflux disease 07/12/2017  . Degenerative disc disease at L5-S1 level 05/14/2017  . Fibromyalgia 05/14/2017  . ADD (attention deficit disorder) 05/14/2017  . Urinary incontinence 04/05/2017  . Diabetes mellitus without complication ([place]) 76/16/0737  . Thigh pain 02/02/2017  . Anxiety and depression 02/02/2017  . Tobacco abuse 02/02/2017  . Facial flushing 02/02/2017  . Hypertension 10/23/2016  . Hidradenitis 10/23/2016    History obtained from: chart review and patient.  Amber Rose's Primary Care Provider is Amber Rakes, MD.     Amber Rose is a 42  y.o. female presenting for a follow up visit. I first saw Amber Rose as a new patient in November 2018. She has a complicated history and had multiple complaints during that visit, chiefly swelling of her bilateral periorbital edema. She had multiple pictures during the visit to show me. We deferred on repeat testing since she had two previous workups that were  negative. We did obtain labs which ruled out HAE.  We also started suppressive dosing of antihistamines, which did seem to work well. She was taking cetirizine twice dialy and reported worsening fatigue. Therefore we changed to [place]. She was also endorsing ear fullness bilaterally, and we started fluticasone nasal spray two sprays per nostril daily to help with Eustachian tube dysfunction. She had oral thrush on exam and we started Diflucan. She has a history of recurrent thrush, therefore we did an immune workup that demonstrated non-protective titers to Streptococcus pneumoniae, therefore she received a Pneumovax with marked improvement in her post-vaccination titers. At the last visit, she was endorsing dysphagia, and we then referred to Gastroenterology for a workup.   Since the last visit, she has mostly done well. She did go to see a gastroenterologist and the results of the endoscopy are pending. She did got to see ENT (Amber Rose), who according to the patient did not do any scoping or evaluation of her right sided cyst. She did have a brain MRI in September 2018 that did incidentally showed a mucous retention cysts in the right maxillary sinus. She would like another referral to a different ENT.   She continues to have these problems with the periorbital swelling. She remains on the cetirizine which does provide some relief. Allegra helped but only had decreased side effects. This was not covered by her insurance so she did not continue with this. There was more pain initially but the antihistamines have helped. The pain has subsided. Her vision has been worst since starting the antihistamines. She has seen an ophthamologist recently, who did not see any major differences.  Infectious history has remained stable. The Pneumovax seems to have helped her frequency of illnesses.    June 2015:     Otherwise, there have been no changes to her past medical history, surgical history, family  history, or social history.    Review of Systems: a 14-point review of systems is pertinent for what is mentioned in HPI.  Otherwise, all other systems were negative. Constitutional: negative other than that listed in the HPI Eyes: negative other than that listed in the HPI Ears, nose, mouth, throat, and face: negative other than that listed in the HPI Respiratory: negative other than that listed in the HPI Cardiovascular: negative other than that listed in the HPI Gastrointestinal: negative other than that listed in the HPI Genitourinary: negative other than that listed in the HPI Integument: negative other than that listed in the HPI Hematologic: negative other than that listed in the HPI Musculoskeletal: negative other than that listed in the HPI Neurological: negative other than that listed in the HPI Allergy/Immunologic: negative other than that listed in the HPI    Objective:   Blood pressure 120/82, pulse 81, resp. rate 17, last menstrual period 10/26/2017, SpO2 97 %. There is no height or weight on file to calculate BMI.   Physical Exam:  General: Alert, interactive, in no acute distress. Pleasant.  Eyes: No conjunctival injection bilaterally, no discharge on the right, no discharge on the left and no Horner-Trantas dots present. PERRL bilaterally. EOMI without pain.  No photophobia.  Ears: Right TM pearly gray with normal light reflex, Left TM pearly gray with normal light reflex, Right TM intact without perforation and Left TM intact without perforation.  Nose/Throat: External nose within normal limits and septum midline. Turbinates edematous and pale with clear discharge. Posterior oropharynx moderately erythematous without cobblestoning in the posterior oropharynx. Tonsils 2+ without exudates.  Tongue without thrush. Adenopathy: no enlarged lymph nodes appreciated in the anterior cervical, occipital, axillary, epitrochlear, inguinal, or popliteal regions. Lungs: Clear to  auscultation without wheezing, rhonchi or rales. No increased work of breathing. CV: Normal S1/S2. No murmurs. Capillary refill <2 seconds.  Skin: Warm and dry, without lesions or rashes. Neuro:   Grossly intact. No focal deficits appreciated. Responsive to questions.  Diagnostic studies: none     Salvatore Marvel, MD [place] [facility] [place]

## 2017-11-19 ENCOUNTER — Encounter: Payer: Self-pay | Admitting: Allergy & Immunology

## 2017-11-19 DIAGNOSIS — J341 Cyst and mucocele of nose and nasal sinus: Secondary | ICD-10-CM | POA: Insufficient documentation

## 2017-11-19 DIAGNOSIS — T783XXA Angioneurotic edema, initial encounter: Secondary | ICD-10-CM | POA: Insufficient documentation

## 2017-11-19 DIAGNOSIS — B999 Unspecified infectious disease: Secondary | ICD-10-CM | POA: Insufficient documentation

## 2017-11-22 ENCOUNTER — Encounter: Payer: Self-pay | Admitting: Internal Medicine

## 2017-11-23 ENCOUNTER — Telehealth: Payer: Self-pay | Admitting: Internal Medicine

## 2017-11-23 NOTE — Telephone Encounter (Signed)
Results reviewed with pt and questions answered.

## 2017-11-26 LAB — CK: Total CK: 63 U/L (ref 24–173)

## 2017-11-26 LAB — ANTINUCLEAR ANTIBODIES, IFA: ANA Titer 1: NEGATIVE

## 2017-11-26 LAB — ANTI-JO 1 ANTIBODY, IGG: Anti JO-1: <0.2 AI (ref 0.0–0.9)

## 2017-11-26 LAB — LIPASE: Lipase: 51 U/L (ref 14–72)

## 2017-11-26 LAB — SJOGRENS SYNDROME-B EXTRACTABLE NUCLEAR ANTIBODY: ENA SSB (LA) Ab: <0.2 AI (ref 0.0–0.9)

## 2017-12-02 NOTE — Telephone Encounter (Signed)
Error

## 2017-12-03 ENCOUNTER — Ambulatory Visit (INDEPENDENT_AMBULATORY_CARE_PROVIDER_SITE_OTHER): Payer: Medicaid Other | Admitting: Allergy & Immunology

## 2017-12-03 ENCOUNTER — Encounter: Payer: Self-pay | Admitting: Allergy & Immunology

## 2017-12-03 VITALS — BP 110/78 | HR 71 | Temp 98.1°F | Resp 16

## 2017-12-03 DIAGNOSIS — T783XXD Angioneurotic edema, subsequent encounter: Secondary | ICD-10-CM

## 2017-12-03 DIAGNOSIS — R600 Localized edema: Secondary | ICD-10-CM

## 2017-12-03 DIAGNOSIS — L732 Hidradenitis suppurativa: Secondary | ICD-10-CM

## 2017-12-03 NOTE — Progress Notes (Signed)
FOLLOW UP  Date of Service/Encounter:  12/03/17   Assessment:   Idiopathic angioedema with urticaria  Hidradenitis  Periorbital edema  Plan/Recommendations:   Amber Rose most concerning symptom at this time include her periorbital edema, which becomes quite severe at times. Thyroid and lupus are main etiologies that come to mind with this, but she has had negative workup for this in the past, most recently in May 2018. Dermatomyositis can present with periorbital edema, therefore we obtained labs to rule this out (these were normal). She had a negative lupus anticoagulant, thyroid studies, and even ANCA panel and ACE level. She did have a positive ANA in the distant past in June 2015, but it was negative last time that we saw her. 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 Another syndrome that can present with periorbital edema is IgG4-related disease, therefore we will obtain IgG subclasses today (we did not have enough blood last time).   1. Angioedema - unknown trigger but responsive to antihistamines - Continue Zyrtec (cetirizine) 10mg  twice daily.  - Come next week for the Xolair injection.  - We are going to get some labs today: serum tryptase to rule out mast cell disease and some other labs to rule out IgG4 related disease (highly unlikely).  - We will rule out chronic granulomatous disease with the history of recurrent abscesses.   2. Ear fullness and right sided nasal ? cyst  - Continue with Flonase two sprays per nostril daily to help control inflammation in your nose and allow drainage of the Eustachian tube. - We will follow up on the other ENT doctor (Dr. Benjamine Mola).   3. Dysphagia  - Continue to follow with the GI doctor.  - I will send the note to your GI doctor.   4. Return in about 3 months (around 03/02/2018).   Subjective:   Amber Rose is a 42 y.o. female presenting today for  follow up of No chief complaint on file.   Amber Rose has a history of the following: Patient Active Problem List   Diagnosis Date Noted  . Recurrent infections 11/19/2017  . Cyst of nasal cavity 11/19/2017  . Angio-edema 11/19/2017  . Abdominal pain, epigastric 11/04/2017  . Dysphagia 11/04/2017  . Gastroesophageal reflux disease 07/12/2017  . Degenerative disc disease at L5-S1 level 05/14/2017  . Fibromyalgia 05/14/2017  . ADD (attention deficit disorder) 05/14/2017  . Urinary incontinence 04/05/2017  . Diabetes mellitus without complication ([place]) 67/09/4579  . Thigh pain 02/02/2017  . Anxiety and depression 02/02/2017  . Current smoker 02/02/2017  . Facial flushing 02/02/2017  . Hypertension 10/23/2016  . Hidradenitis 10/23/2016    History obtained from: chart review and patient.  Amber Rose's Primary Care Provider is Charlott Rakes, MD.     Amber Rose is a 42 y.o. female presenting for a sick visit. Amber Rose is a 42 y.o. female presenting for a follow up visit. I first saw [name] a new patient in November 2018. She has a complicated history and had multiple complaints during that visit, chiefly swelling of her bilateral periorbital edema. She had multiple pictures during the visit to show me. We deferred on repeat testing since she had two previous workups that were negative. We did obtain labs which ruled out HAE. We also started suppressive dosing of antihistamines, which did seem to work well; currently she is on cetirizine one tablet 2-3 times daily (increases during flares). She has a history  of recurrent thrush, therefore we did an immune workup that demonstrated non-protective titers to Streptococcus pneumoniae, therefore she received a Pneumovax with marked improvement in her post-vaccination titers. At the last visit, she was endorsing dry eye therefore we obtained labs to rule out Sjogrens syndrome; this was normal. We discussed starting Xolair  as a means of controlling her angioedema and she did sign paperwork for this. We recommended that she see another ENT provider for evaluation of her nasal cyst and ear fullness. We recommended continued follow up with GI (she has a history of decreased gastric motility of unknown etiology).   Since the last visit, she has developed a swelling episode and requested to be seen. This is one started yesterday with eye pressure and she awoke with swelling. Her attacks always seem to be worse in the mornings after laying supine and she is unsure of the reasoning of this. She did take some extra doses of cetirizine and this seemed to help. She did bring pictures today, which do appear similar to the previous pictures of swelling. Of note, she has been on prednisone, and this did help her swelling. However, it messed up her diabetes control and she prefers not to use it. Although these swelling episodes are pruritic, she has never had much in the way of urticaria (only swelling). The Xolair has been approved, but she has not set up the appointment. She is worried about reactions to the Xolair.   This is seven days prior ot her cyccle, which seems to make things worse. She has bouts even when she is not during her cycle. She has a tubal ligation and does not use any oral contraceptive so there is no exogenous estrogen exposure.   She also developed a "bump on her butt". It never really came to a head. These only seem to flare seven days before her cycle. She did see her PCP and it had popped during that time. This never responds to antibiotics. She was diagnosed as hiadrenitis. She has never needed hospitalization for treatment of these lesions.   She remains on the omeprazole which has improved the burning. She does endorse fullness but overall the omeprazole is improving. She has an appointment on 12/13/17 with her GI provider.   Otherwise, there have been no changes to her past medical history, surgical history,  family history, or social history.    Review of Systems: a 14-point review of systems is pertinent for what is mentioned in HPI.  Otherwise, all other systems were negative. Constitutional: negative other than that listed in the HPI Eyes: negative other than that listed in the HPI Ears, nose, mouth, throat, and face: negative other than that listed in the HPI Respiratory: negative other than that listed in the HPI Cardiovascular: negative other than that listed in the HPI Gastrointestinal: negative other than that listed in the HPI Genitourinary: negative other than that listed in the HPI Integument: negative other than that listed in the HPI Hematologic: negative other than that listed in the HPI Musculoskeletal: negative other than that listed in the HPI Neurological: negative other than that listed in the HPI Allergy/Immunologic: negative other than that listed in the HPI    Objective:   Blood pressure 110/78, pulse 71, temperature 98.1 F (36.7 C), temperature source Oral, resp. rate 16, SpO2 97 %. There is no height or weight on file to calculate BMI.   Physical Exam:  General: Alert, interactive, in no acute distress. Pleasant. Mild periorbital swelling.  Eyes:  No conjunctival injection bilaterally, no discharge on the right, no discharge on the left and no Horner-Trantas dots present. PERRL bilaterally. EOMI without pain. No photophobia.  Ears: Right TM pearly gray with normal light reflex, Left TM pearly gray with normal light reflex, Right TM intact without perforation and Left TM intact without perforation.  Nose/Throat: External nose within normal limits and septum midline. Turbinates edematous with clear discharge. Posterior oropharynx mildly erythematous without cobblestoning in the posterior oropharynx. Tonsils 2+ without exudates.  Tongue without thrush. Adenopathy: no enlarged lymph nodes appreciated in the anterior cervical, occipital, axillary, epitrochlear,  inguinal, or popliteal regions. Lungs: Clear to auscultation without wheezing, rhonchi or rales. No increased work of breathing. CV: Normal S1/S2. No murmurs. Capillary refill <2 seconds.  Skin: Warm and dry, without lesions or rashes. Neuro:   Grossly intact. No focal deficits appreciated. Responsive to questions.  Diagnostic studies: none     Salvatore Marvel, MD [place] [facility] [place]

## 2017-12-03 NOTE — Patient Instructions (Addendum)
1. Angioedema - unknown trigger but responsive to antihistamines - Continue Zyrtec (cetirizine) 10mg  twice daily.  - Come next week for the Xolair injection.  - We are going to get some labs today: serum tryptase to rule out mast cell disease and some other labs to look at very random causes of eye swelling.   2. Ear fullness and right sided nasal ? cyst  - Continue with Flonase two sprays per nostril daily to help control inflammation in your nose and allow drainage of the Eustachian tube. - We will follow up on the other ENT doctor (Dr. Benjamine Mola).   3. Dysphagia  - Continue to follow with the GI doctor.  - I will send the note to your GI doctor.   4. Return in about 3 months (around 03/02/2018).   Please inform us of any Emergency Department visits, hospitalizations, or changes in symptoms. Call us before going to the ED for breathing or allergy symptoms since we might be able to fit you in for a sick visit. Feel free to contact us anytime with any questions, problems, or concerns.  It was a pleasure to see you again today! Happy New Year!   Websites that have reliable patient information: 1. American Academy of Asthma, Allergy, and Immunology: www.aaaai.org 2. Food Allergy Research and Education (FARE): foodallergy.org 3. Mothers of Asthmatics: http://www.asthmacommunitynetwork.org 4. American College of Allergy, Asthma, and Immunology: www.acaai.org

## 2017-12-06 ENCOUNTER — Other Ambulatory Visit: Payer: Self-pay | Admitting: Allergy & Immunology

## 2017-12-06 ENCOUNTER — Ambulatory Visit (INDEPENDENT_AMBULATORY_CARE_PROVIDER_SITE_OTHER): Payer: Medicaid Other | Admitting: *Deleted

## 2017-12-06 DIAGNOSIS — L501 Idiopathic urticaria: Secondary | ICD-10-CM

## 2017-12-06 LAB — TRYPTASE: Tryptase: 5.5 ug/L (ref 2.2–13.2)

## 2017-12-06 LAB — IGG 1, 2, 3, AND 4
IgG (Immunoglobin G), Serum: 907 mg/dL (ref 700–1600)
IgG, Subclass 1: 361 mg/dL (ref 248–810)
IgG, Subclass 2: 413 mg/dL (ref 130–555)
IgG, Subclass 3: 43 mg/dL (ref 15–102)
IgG, Subclass 4: 8 mg/dL (ref 2–96)

## 2017-12-06 MED ORDER — OMALIZUMAB 150 MG ~~LOC~~ SOLR
300.0000 mg | SUBCUTANEOUS | Status: DC
  Administered 2017-12-06 – 2018-01-03 (×2): 300 mg via SUBCUTANEOUS

## 2017-12-08 LAB — IGG 1, 2, 3, AND 4: IgG (Immunoglobin G), Serum: 941 mg/dL (ref 700–1600)

## 2017-12-09 LAB — NEUTROPHIL OXIDATIVE BURST

## 2017-12-10 ENCOUNTER — Ambulatory Visit: Payer: Medicaid Other | Admitting: Gastroenterology

## 2017-12-10 ENCOUNTER — Telehealth: Payer: Self-pay

## 2017-12-10 DIAGNOSIS — B999 Unspecified infectious disease: Secondary | ICD-10-CM

## 2017-12-10 NOTE — Telephone Encounter (Signed)
Spoke with patient and informed her that we need to do a redrawn for her neutrophil oxidative burst lab. Patient stated that she would come in either Monday or Tuesday of next week. Orders have been placed and given to [facility] for redraw.

## 2017-12-10 NOTE — Telephone Encounter (Signed)
Patient has been informed and is agreeable with this.

## 2017-12-10 NOTE — Telephone Encounter (Signed)
After talking with Amber Rose and he speaking with [facility], patient would need to come in before 2 pm so that we can get her labs sent out on time.

## 2017-12-13 ENCOUNTER — Ambulatory Visit: Payer: Medicaid Other | Admitting: Physician Assistant

## 2017-12-13 ENCOUNTER — Telehealth: Payer: Self-pay | Admitting: *Deleted

## 2017-12-13 ENCOUNTER — Other Ambulatory Visit: Payer: Medicaid Other

## 2017-12-13 ENCOUNTER — Ambulatory Visit (INDEPENDENT_AMBULATORY_CARE_PROVIDER_SITE_OTHER): Payer: Medicaid Other | Admitting: Allergy

## 2017-12-13 ENCOUNTER — Encounter: Payer: Self-pay | Admitting: Allergy

## 2017-12-13 ENCOUNTER — Encounter: Payer: Self-pay | Admitting: Physician Assistant

## 2017-12-13 VITALS — BP 124/78 | HR 80 | Ht 63.0 in | Wt 196.1 lb

## 2017-12-13 VITALS — BP 112/78 | HR 94 | Temp 98.5°F | Resp 19

## 2017-12-13 DIAGNOSIS — K219 Gastro-esophageal reflux disease without esophagitis: Secondary | ICD-10-CM | POA: Diagnosis not present

## 2017-12-13 DIAGNOSIS — E109 Type 1 diabetes mellitus without complications: Secondary | ICD-10-CM | POA: Diagnosis not present

## 2017-12-13 DIAGNOSIS — L501 Idiopathic urticaria: Secondary | ICD-10-CM | POA: Diagnosis not present

## 2017-12-13 DIAGNOSIS — K3184 Gastroparesis: Secondary | ICD-10-CM

## 2017-12-13 DIAGNOSIS — T783XXD Angioneurotic edema, subsequent encounter: Secondary | ICD-10-CM

## 2017-12-13 DIAGNOSIS — R062 Wheezing: Secondary | ICD-10-CM | POA: Diagnosis not present

## 2017-12-13 DIAGNOSIS — J019 Acute sinusitis, unspecified: Secondary | ICD-10-CM

## 2017-12-13 MED ORDER — DM-GUAIFENESIN ER 60-1200 MG PO TB12: 1.0000 | ORAL_TABLET | Freq: Two times a day (BID) | ORAL | 0 refills | Status: AC

## 2017-12-13 MED ORDER — ALBUTEROL SULFATE (2.5 MG/3ML) 0.083% IN NEBU
2.5000 mg | INHALATION_SOLUTION | Freq: Once | RESPIRATORY_TRACT | Status: AC
  Administered 2017-12-13: 2.5 mg via RESPIRATORY_TRACT

## 2017-12-13 MED ORDER — ALBUTEROL SULFATE HFA 108 (90 BASE) MCG/ACT IN AERS: 2.0000 | INHALATION_SPRAY | RESPIRATORY_TRACT | 1 refills | Status: DC | PRN

## 2017-12-13 MED ORDER — OMEPRAZOLE 40 MG PO CPDR: DELAYED_RELEASE_CAPSULE | ORAL | 3 refills | Status: DC

## 2017-12-13 MED ORDER — DOXYCYCLINE HYCLATE 100 MG PO TABS: 100.0000 mg | ORAL_TABLET | Freq: Two times a day (BID) | ORAL | 0 refills | Status: AC

## 2017-12-13 NOTE — Progress Notes (Signed)
Follow-up Note  RE: Amber Rose MRN: [id number] DOB: [birth year] Date of Office Visit: 12/13/2017   History of present illness: Amber Rose is a 42 y.o. female presenting today for sinus congestion.  She was last seen in the office by Dr. Ernst Bowler on December 03, 2017.  Amber Rose reports receiving a Xolair injection last week in clinic for angioedema. She reports that after receiving the injection she felt fatigued and that 'something was off' but did well the rest of the day without any issues. The following morning she noted swelling in the 'lymph nodes of her throat' as well as a sore throat. This progressed to shortness of breath, cough with brown phlegm production, and sinus congestion. She denies any fevers but notes episodes of diaphoresis. She says that last night she had difficulty catching her breath and took a leftover prednisone tablet (reports taking one, unsure the dosage) with improvement in her breathing. She woke up last night with worse shortness of breath that responded to another dose of prednisone. She denies any swelling, rashes, hives. She does note that her angioedema has improved after receiving the Xolair injection however she still does have some periorbital swelling occasionally. She denies any history of asthma. Reports having similar symptoms following taking Xanax and a blood pressure medication in the past.    Review of systems: Review of Systems  Constitutional: Positive for diaphoresis and malaise/fatigue. Negative for chills and fever.  HENT: Positive for congestion, ear pain, sinus pain and sore throat. Negative for ear discharge, nosebleeds and tinnitus.   Eyes: Negative for pain, discharge and redness.  Respiratory: Positive for cough, sputum production, shortness of breath and wheezing. Negative for stridor.   Cardiovascular: Negative for chest pain.  Gastrointestinal: Negative for abdominal pain, constipation, diarrhea, heartburn, nausea and  vomiting.  Musculoskeletal: Negative for joint pain and myalgias.  Skin: Negative for itching and rash.  Neurological: Negative for headaches.    All other systems negative unless noted above in HPI  Past medical/social/surgical/family history have been reviewed and are unchanged unless specifically indicated below.  No changes  Medication List: Allergies as of 12/13/2017      Reactions   Metformin And Related Nausea And Vomiting   Penicillins    childhood   Xanax [alprazolam]    "Caused upper respiratory symptoms" per pt   Glyburide Other (See Comments)   "blood sugar dropped uncontrollably"   Linagliptin Diarrhea, Other (See Comments)   Stomach pain, sinus infection   Moxifloxacin Diarrhea      Medication List        Accurate as of 12/13/17  4:59 PM. Always use your most recent med list.          ACCU-CHEK AVIVA PLUS w/Device Kit USE AS DIRECTED DAILY. E11.9   accu-chek softclix lancets Use as instructed daily.   acetaminophen 500 MG tablet Commonly known as:  TYLENOL Take 500 mg by mouth every 6 (six) hours as needed.   acyclovir 400 MG tablet Commonly known as:  ZOVIRAX Take 1 tablet (400 mg total) by mouth 2 (two) times daily.   albuterol 108 (90 Base) MCG/ACT inhaler Commonly known as:  PROAIR HFA Inhale 2 puffs into the lungs every 4 (four) hours as needed for wheezing or shortness of breath.   amLODipine 10 MG tablet Commonly known as:  NORVASC Take 1 tablet (10 mg total) by mouth daily.   cetirizine 10 MG tablet Commonly known as:  ZYRTEC Take 10 mg by mouth daily.  Dextromethorphan-Guaifenesin 60-1200 MG 12hr tablet Take 1 tablet by mouth 2 (two) times daily for 7 days.   doxycycline 100 MG tablet Commonly known as:  VIBRA-TABS Take 1 tablet (100 mg total) by mouth 2 (two) times daily for 7 days.   Exenatide ER 2 MG Pen Commonly known as:  BYDUREON INJECT 2 MG INTO THE SKIN ONCE A WEEK.   fluticasone 50 MCG/ACT nasal spray Commonly  known as:  FLONASE USE 2 SPRAYS IN BOTH NOSTRILS DAILY   glucose blood test strip Commonly known as:  ACCU-CHEK AVIVA Use as instructed daily   ibuprofen 600 MG tablet Commonly known as:  ADVIL,MOTRIN Take 600 mg by mouth every 6 (six) hours as needed.   insulin aspart 100 UNIT/ML FlexPen Commonly known as:  NOVOLOG FLEXPEN 0-12 units subcutaneously 3 times daily before meals   Insulin Pen Needle 31G X 5 MM Misc 1 each by Does not apply route 3 (three) times daily before meals.   lisinopril 20 MG tablet Commonly known as:  PRINIVIL,ZESTRIL Take 1 tablet (20 mg total) by mouth daily.   meloxicam 7.5 MG tablet Commonly known as:  MOBIC Take 1 tablet (7.5 mg total) by mouth daily.   methocarbamol 750 MG tablet Commonly known as:  ROBAXIN TAKE 1 TABLET BY MOUTH FOUR TIMES A DAY   omeprazole 40 MG capsule Commonly known as:  PRILOSEC Take 1 tab every morning.   podophyllum resin 25 % Soln Commonly known as:  PODOCON-25 Apply 1 mL topically once a week. To wart on L index finger   traZODone 100 MG tablet Commonly known as:  DESYREL Take 1 tablet (100 mg total) by mouth at bedtime as needed for sleep.       Known medication allergies: Allergies  Allergen Reactions  . Metformin And Related Nausea And Vomiting  . Penicillins     childhood  . Xanax [Alprazolam]     "Caused upper respiratory symptoms" per pt  . Glyburide Other (See Comments)    "blood sugar dropped uncontrollably"  . Linagliptin Diarrhea and Other (See Comments)    Stomach pain, sinus infection  . Moxifloxacin Diarrhea     Physical examination: Blood pressure 112/78, pulse 94, temperature 98.5 F (36.9 C), resp. rate 19, SpO2 94 %.  General: Alert, interactive, in no acute distress. HEENT: PERRLA, TMs pearly gray, turbinates moderately edematous with clear discharge, post-pharynx non erythematous. Neck: Supple without lymphadenopathy. Lungs: Mildly decreased breath sounds with expiratory  wheezing bilaterally. {no increased work of breathing.  Aeration and wheezing improved with albuterol neb CV: Normal S1, S2 without murmurs. Abdomen: Nondistended, nontender. Skin: Warm and dry, without lesions or rashes. Extremities:  No clubbing, cyanosis or edema. Neuro:   Grossly intact.  Diagnositics/Labs: None today  Assessment and plan:   Acute rhinosinusitis with wheezing  -Symptoms started the day of her Xolair injection.  She has continued to have symptoms that are not improving over the past week with nasal congestion and drainage as well as chest congestion, wheezing, shortness of breath with a productive cough.  Symptoms did improve with prednisone that she had from a another provider in [place].  She states she was even hesitant to take this last night as it usually elevates her blood sugars greatly and she wants to avoid this at all cost.     -She did have improvement in her respiratory symptoms after albuterol thus I have prescribed a rescue inhaler for her for as needed use   -I also recommended that  she take Mucinex DM to help loosen mucus and to help with cough   -I have also provided her with a antibiotic prescription for doxycycline to use if she does not have any improvements in her symptoms by the end of the week.      -I did counsel her today that her symptoms does not sound consistent with reaction to Xolair and that she most likely has a coincidental respiratory illness and this should not interfere with her proceeding with Xolair therapy for angioedema/urticaria control.  However she would discuss this further with her primary allergist Dr. Ernst Bowler.  Angioedema/idiopathic urticaria- unknown trigger but responsive to antihistamines - Continue Zyrtec (cetirizine) 24m twice daily.  -She started Xolair injections last week.  I have advised that Dr. GErnst Bowlerwill be in touch with her in regards to appropriate next steps.  Patient does seem to be amenable to taking next  month Xolair injection -Patient needed to have her labs redrawn which was done today  Return in about 3 months (around 03/02/2018).  I appreciate the opportunity to take part in Amber Rose's care. Please do not hesitate to contact me with questions.  Sincerely,   SPrudy Feeler MD [facility]

## 2017-12-13 NOTE — Patient Instructions (Addendum)
Please go to the basement level to our lab for urine test, 24 hour.   You have been scheduled for a gastric emptying scan at [facility] on Tuesday 2-26 at 7:30 am. Please arrive at least 7:15 am  to your appointment for registration. Please make certain not to have anything to eat or drink after midnight the night before your test. Hold all stomach medications (ex: Zofran, phenergan, Reglan) 48 hours prior to your test. If you need to reschedule your appointment, please contact radiology scheduling at [phone]. _____________________________________________________________________ A gastric-emptying study measures how long it takes for food to move through your stomach. There are several ways to measure stomach emptying. In the most common test, you eat food that contains a small amount of radioactive material. A scanner that detects the movement of the radioactive material is placed over your abdomen to monitor the rate at which food leaves your stomach. This test normally takes about 4 hours to complete.  If you are age 52 or younger, your body mass index should be between 19-25. Your Body mass index is 34.74 kg/m. If this is out of the aformentioned range listed, please consider follow up with your Primary Care Provider.

## 2017-12-13 NOTE — Progress Notes (Addendum)
Subjective:    Patient ID: Amber Rose, female    DOB: 1976-08-11, 42 y.o.   MRN: [id number]  HPI Amber Rose is a 42 year old white female, known to Dr. Hilarie Fredrickson who comes in for office visit after recent EGD. Patient has history of hypertension, GERD, degenerative disc disease, insulin-dependent diabetes mellitus. She had been seen in the office in January 2019 as a new patient with complaints of epigastric pain and bloating and some dysphagia. She initially had barium swallow which showed a small hiatal hernia but was otherwise normal. She then had EGD on 11/16/2017 which showed a 2 cm hiatal hernia, normal-appearing esophagus there is a large amount of residual food in her stomach consistent with a bezoar, Biopsies from the esophagus showed reactive squamous mucosa with increased intraepithelial lymphocytes but no increased eosinophils. She is now on omeprazole 40 mg daily and says that has helped her reflux symptoms. She continues to feel full and bloated most of the time, also complains of having a lot of gas. She is having intermittent episodes of vomiting, sometimes undigested food. She is trying to follow a gastroparesis diet but says that is difficult while also trying to follow a diabetic diet. She also mentions that she has had undigested food obvious in her stool long-term. Another issue that she brings up today is that she has been seeing an allergist and has been having recurrent problems with what they feel is due to an excess of histamine. She's giving recurrent episodes of edema around her eyes and upper face associated with headaches. She apparently was just treated with Xolair which has now caused an upper respiratory infection. She says she's been coughing and wheezing, and congested over the past couple of days. She is frustrated by lack of answers to the cause of this and wonders if there is anything going on in her GI tract that could be triggering this.  Review of Systems  Pertinent positive and negative review of systems were noted in the above HPI section.  All other review of systems was otherwise negative.  Outpatient Encounter Medications as of 12/13/2017  Medication Sig  . acetaminophen (TYLENOL) 500 MG tablet Take 500 mg by mouth every 6 (six) hours as needed.  Marland Kitchen acyclovir (ZOVIRAX) 400 MG tablet Take 1 tablet (400 mg total) by mouth 2 (two) times daily.  Marland Kitchen albuterol (PROAIR HFA) 108 (90 Base) MCG/ACT inhaler Inhale 2 puffs into the lungs every 4 (four) hours as needed for wheezing or shortness of breath.  Marland Kitchen amLODipine (NORVASC) 10 MG tablet Take 1 tablet (10 mg total) by mouth daily.  . Blood Glucose Monitoring Suppl (ACCU-CHEK AVIVA PLUS) w/Device KIT USE AS DIRECTED DAILY. E11.9  . cetirizine (ZYRTEC) 10 MG tablet Take 10 mg by mouth daily.  Marland Kitchen Dextromethorphan-Guaifenesin 60-1200 MG 12hr tablet Take 1 tablet by mouth 2 (two) times daily for 7 days.  Marland Kitchen doxycycline (VIBRA-TABS) 100 MG tablet Take 1 tablet (100 mg total) by mouth 2 (two) times daily for 7 days.  . Exenatide ER (BYDUREON) 2 MG PEN INJECT 2 MG INTO THE SKIN ONCE A WEEK.  . fluticasone (FLONASE) 50 MCG/ACT nasal spray USE 2 SPRAYS IN BOTH NOSTRILS DAILY  . glucose blood (ACCU-CHEK AVIVA) test strip Use as instructed daily  . ibuprofen (ADVIL,MOTRIN) 600 MG tablet Take 600 mg by mouth every 6 (six) hours as needed.  . insulin aspart (NOVOLOG FLEXPEN) 100 UNIT/ML FlexPen 0-12 units subcutaneously 3 times daily before meals (Patient taking differently: 0-12 units subcutaneously 3  times daily before meals, as needed)  . Insulin Pen Needle 31G X 5 MM MISC 1 each by Does not apply route 3 (three) times daily before meals.  . Lancet Devices (ACCU-CHEK SOFTCLIX) lancets Use as instructed daily.  . lisinopril (PRINIVIL,ZESTRIL) 20 MG tablet Take 1 tablet (20 mg total) by mouth daily.  . meloxicam (MOBIC) 7.5 MG tablet Take 1 tablet (7.5 mg total) by mouth daily.  . methocarbamol (ROBAXIN) 750 MG tablet  TAKE 1 TABLET BY MOUTH FOUR TIMES A DAY  . omeprazole (PRILOSEC) 40 MG capsule Take 1 tab every morning.  . podophyllum resin (PODOCON-25) 25 % SOLN Apply 1 mL topically once a week. To wart on L index finger  . traZODone (DESYREL) 100 MG tablet Take 1 tablet (100 mg total) by mouth at bedtime as needed for sleep.  . [DISCONTINUED] omeprazole (PRILOSEC) 40 MG capsule Take 1 capsule (40 mg total) by mouth daily.  . [DISCONTINUED] cetirizine (ZYRTEC) 10 MG tablet Take 1 tablet (10 mg total) daily by mouth. (Patient not taking: Reported on 09/15/2017)  . [DISCONTINUED] DULoxetine (CYMBALTA) 60 MG capsule Take 1 capsule (60 mg total) by mouth daily. (Patient not taking: Reported on 12/13/2017)  . [DISCONTINUED] mupirocin ointment (BACTROBAN) 2 % Apply to AA bid X 1 week (Patient not taking: Reported on 12/13/2017)  . [DISCONTINUED] sertraline (ZOLOFT) 50 MG tablet Take by mouth.   Facility-Administered Encounter Medications as of 12/13/2017  Medication  . 0.9 %  sodium chloride infusion  . [COMPLETED] albuterol (PROVENTIL) (2.5 MG/3ML) 0.083% nebulizer solution 2.5 mg  . omalizumab (XOLAIR) injection 300 mg   Allergies  Allergen Reactions  . Metformin And Related Nausea And Vomiting  . Penicillins     childhood  . Xanax [Alprazolam]     "Caused upper respiratory symptoms" per pt  . Glyburide Other (See Comments)    "blood sugar dropped uncontrollably"  . Linagliptin Diarrhea and Other (See Comments)    Stomach pain, sinus infection  . Moxifloxacin Diarrhea   Patient Active Problem List   Diagnosis Date Noted  . Recurrent infections 11/19/2017  . Cyst of nasal cavity 11/19/2017  . Angio-edema 11/19/2017  . Abdominal pain, epigastric 11/04/2017  . Dysphagia 11/04/2017  . Gastroesophageal reflux disease 07/12/2017  . Degenerative disc disease at L5-S1 level 05/14/2017  . Fibromyalgia 05/14/2017  . ADD (attention deficit disorder) 05/14/2017  . Urinary incontinence 04/05/2017  .  Diabetes mellitus without complication (HCC) 02/26/2017  . Thigh pain 02/02/2017  . Anxiety and depression 02/02/2017  . Current smoker 02/02/2017  . Facial flushing 02/02/2017  . Hypertension 10/23/2016  . Hidradenitis 10/23/2016   Social History   Socioeconomic History  . Marital status: Single    Spouse name: Not on file  . Number of children: Not on file  . Years of education: Not on file  . Highest education level: Not on file  Social Needs  . Financial resource strain: Not on file  . Food insecurity - worry: Not on file  . Food insecurity - inability: Not on file  . Transportation needs - medical: Not on file  . Transportation needs - non-medical: Not on file  Occupational History  . Not on file  Tobacco Use  . Smoking status: Current Every Day Smoker    Packs/day: 1.00    Years: 20.00    Pack years: 20.00    Types: Cigarettes  . Smokeless tobacco: Never Used  Substance and Sexual Activity  . Alcohol use: Yes      Frequency: Never    Comment: rarely  . Drug use: Yes    Types: Marijuana    Comment: last smoker 11-14-17  . Sexual activity: No    Comment: pt on her mentral cycle now began 11-14-17  Other Topics Concern  . Not on file  Social History Narrative   Lives home with adult niece.  Not working.  Disability pending.  Pt is single.  Education GED.  3 children.     Amber Rose's family history includes Cancer in her mother; Diabetes in her father; Multiple sclerosis in her paternal grandmother.      Objective:    Vitals:   12/13/17 1110  BP: 124/78  Pulse: 80    Physical Exam ; well-developed white female in no acute distress, coughing repeatedly. Pleasant blood pressure 124/78 pulse 80, height 5 foot 3, weight 196 BMI 34.7. HEENT; nontraumatic normocephalic EOMI PERRLA, she has some very mild.peri-Orbital edema,, Cardiovascular ;regular rate and rhythm with S1-S2 no murmur or gallop, Pulmonary; scattered rhonchi and wheeze, Abdomen ;soft, no focal  tenderness, no palpable mass or hepatosplenomegaly, no guarding or rebound bowel sounds are present, Rectal; exam not done, Ext; no clubbing cyanosis or edema skin warm and dry, Neuropsych; mood and affect appropriate       Assessment & Plan:   #68 42 year old female diabetic with GERD, and probable gastroparesis with recent EGD showing a lot of residual food in the stomach. Reflux symptoms are improved on omeprazole but continues to be symptomatic with upper abdominal fullness bloating and intermittent vomiting despite step 3 gastroparesis diet. #2 adult-onset diabetes mellitus insulin-dependent  #3 recurrent episodes of peri  orbital edema, headache-of unclear etiology. Workup her allergist negative thus far now with upper respiratory infection triggered by Xolair Question angioedema  Plan;Continue omeprazole 40 mg by mouth every morning Discussed stricter adherence to step to do step 3 gastroparesis diet. We will also set patient up with a nutrition/dietitian consult Schedule for 4 hour gastric emptying scan-if this is positive she will need a trial of prokinetic Will schedule for 24-hour urine for 5-HIAA given her recurrent histamine related symptoms as described above. Also note that she is on lisinopril and Ace inhibitors can be associated with angioedema-type symptoms and this may need to be discontinued. Will plan office follow-up with Dr. Hilarie Fredrickson or myself in one month.   Qasim Diveley S Donalyn Schneeberger PA-C 12/13/2017   Addendum: Reviewed and agree with ongoing management. Pyrtle, Lajuan Lines, MD     Cc: Amber Rakes, MD

## 2017-12-13 NOTE — Telephone Encounter (Signed)
Patient called into the shot room. States that she received her Xolair injection on Dec 06, 2017 and 2 days later started getting sick having nasal congestion which is getting worse day by day. Patient believes she had a reaction to her Xolair injection. Patient given an appt for today to see one of our providers advised to get here at 10 am patient verbalized understanding.

## 2017-12-13 NOTE — Patient Instructions (Addendum)
Acute rhinosinusitis with wheezing  -Symptoms started the day of her Xolair injection.  She has continued to have symptoms that are not improving over the past week with nasal congestion and drainage as well as chest congestion, wheezing, shortness of breath with a productive cough.  Symptoms did improve with prednisone that she had from a another provider [place].  She states she was even hesitant to take this last night as it usually elevates her blood sugars greatly and she wants to avoid this at all cost.     -She did have improvement in her respiratory symptoms after albuterol thus I have prescribed a rescue inhaler for her for as needed use   -I also recommended that she take Mucinex DM to help loosen mucus and to help with cough   -I have also provided her with a antibiotic prescription for doxycycline to use if she does not have any improvements in her symptoms by the end of the week.      -I did counsel her today that her symptoms does not sound consistent with reaction to Xolair and that she most likely has a coincidental respiratory illness and this should not interfere with her proceeding with Xolair therapy for angioedema/urticaria control.  However she would discuss this further with her primary allergist Dr. Ernst Bowler.  Angioedema/idiopathic urticaria- unknown trigger but responsive to antihistamines - Continue Zyrtec (cetirizine) 10mg  twice daily.  -She started Xolair injections last week.  I have advised that Dr. Ernst Bowler will be in touch with her in regards to appropriate next steps.  Patient does seem to be amenable to taking next month Xolair injection -Patient needed to have her labs redrawn which was done today  Return in about 3 months (around 03/02/2018).

## 2017-12-15 ENCOUNTER — Other Ambulatory Visit: Payer: Self-pay | Admitting: Family Medicine

## 2017-12-15 ENCOUNTER — Other Ambulatory Visit: Payer: Medicaid Other

## 2017-12-15 ENCOUNTER — Encounter: Payer: Self-pay | Admitting: Family Medicine

## 2017-12-15 DIAGNOSIS — K219 Gastro-esophageal reflux disease without esophagitis: Secondary | ICD-10-CM

## 2017-12-15 DIAGNOSIS — K3184 Gastroparesis: Secondary | ICD-10-CM

## 2017-12-15 LAB — NEUTROPHIL OXIDATIVE BURST

## 2017-12-15 MED ORDER — CARVEDILOL 6.25 MG PO TABS: 6.2500 mg | ORAL_TABLET | Freq: Two times a day (BID) | ORAL | 3 refills | Status: DC

## 2017-12-15 NOTE — Progress Notes (Signed)
Discontinued lisinopril and substituted with Carvedilol as per patient request to discontinue lisinopril due to ongoing facial swelling.

## 2017-12-18 LAB — 5 HIAA, QUANTITATIVE, URINE, 24 HOUR: 5-HIAA, Ur: 2.8 mg/L

## 2017-12-20 ENCOUNTER — Encounter: Payer: Self-pay | Admitting: Family Medicine

## 2017-12-21 ENCOUNTER — Encounter: Payer: Self-pay | Admitting: Physician Assistant

## 2017-12-21 ENCOUNTER — Telehealth: Payer: Self-pay | Admitting: Allergy & Immunology

## 2017-12-21 ENCOUNTER — Other Ambulatory Visit: Payer: Self-pay

## 2017-12-21 ENCOUNTER — Ambulatory Visit (HOSPITAL_COMMUNITY)
Admission: RE | Admit: 2017-12-21 | Discharge: 2017-12-21 | Disposition: A | Discharge status: Home / Self Care | Payer: Medicaid Other | Source: Ambulatory Visit | Attending: Physician Assistant | Admitting: Physician Assistant

## 2017-12-21 ENCOUNTER — Other Ambulatory Visit: Payer: Medicaid Other

## 2017-12-21 DIAGNOSIS — K219 Gastro-esophageal reflux disease without esophagitis: Secondary | ICD-10-CM | POA: Insufficient documentation

## 2017-12-21 DIAGNOSIS — K3184 Gastroparesis: Secondary | ICD-10-CM | POA: Insufficient documentation

## 2017-12-21 DIAGNOSIS — T783XXS Angioneurotic edema, sequela: Secondary | ICD-10-CM

## 2017-12-21 NOTE — Telephone Encounter (Signed)
[facility] is working on this. Due to 3 blood draws

## 2017-12-21 NOTE — Telephone Encounter (Signed)
Pt calling about her lab results [phone]

## 2017-12-22 NOTE — Telephone Encounter (Signed)
Mark from [facility] is taking care of this due to this patient having multiple blood draws and results are coming back as not resulted.

## 2017-12-28 ENCOUNTER — Encounter: Payer: Self-pay | Admitting: Physician Assistant

## 2017-12-28 ENCOUNTER — Encounter: Payer: Self-pay | Admitting: Family Medicine

## 2017-12-28 ENCOUNTER — Ambulatory Visit: Payer: Medicaid Other | Attending: Family Medicine | Admitting: Family Medicine

## 2017-12-28 VITALS — BP 108/71 | HR 71 | Temp 98.1°F | Ht 63.0 in | Wt 196.4 lb

## 2017-12-28 DIAGNOSIS — K219 Gastro-esophageal reflux disease without esophagitis: Secondary | ICD-10-CM | POA: Diagnosis not present

## 2017-12-28 DIAGNOSIS — Z8661 Personal history of infections of the central nervous system: Secondary | ICD-10-CM | POA: Insufficient documentation

## 2017-12-28 DIAGNOSIS — Z79899 Other long term (current) drug therapy: Secondary | ICD-10-CM | POA: Diagnosis not present

## 2017-12-28 DIAGNOSIS — Z881 Allergy status to other antibiotic agents status: Secondary | ICD-10-CM | POA: Insufficient documentation

## 2017-12-28 DIAGNOSIS — M79651 Pain in right thigh: Secondary | ICD-10-CM

## 2017-12-28 DIAGNOSIS — Z880 Allergy status to penicillin: Secondary | ICD-10-CM | POA: Diagnosis not present

## 2017-12-28 DIAGNOSIS — M797 Fibromyalgia: Secondary | ICD-10-CM | POA: Insufficient documentation

## 2017-12-28 DIAGNOSIS — E119 Type 2 diabetes mellitus without complications: Secondary | ICD-10-CM

## 2017-12-28 DIAGNOSIS — X58XXXD Exposure to other specified factors, subsequent encounter: Secondary | ICD-10-CM | POA: Diagnosis not present

## 2017-12-28 DIAGNOSIS — I10 Essential (primary) hypertension: Secondary | ICD-10-CM | POA: Diagnosis not present

## 2017-12-28 DIAGNOSIS — Z888 Allergy status to other drugs, medicaments and biological substances status: Secondary | ICD-10-CM | POA: Diagnosis not present

## 2017-12-28 DIAGNOSIS — F419 Anxiety disorder, unspecified: Secondary | ICD-10-CM

## 2017-12-28 DIAGNOSIS — E785 Hyperlipidemia, unspecified: Secondary | ICD-10-CM | POA: Insufficient documentation

## 2017-12-28 DIAGNOSIS — T783XXD Angioneurotic edema, subsequent encounter: Secondary | ICD-10-CM | POA: Diagnosis not present

## 2017-12-28 DIAGNOSIS — Z9049 Acquired absence of other specified parts of digestive tract: Secondary | ICD-10-CM | POA: Insufficient documentation

## 2017-12-28 DIAGNOSIS — E1149 Type 2 diabetes mellitus with other diabetic neurological complication: Secondary | ICD-10-CM

## 2017-12-28 DIAGNOSIS — F329 Major depressive disorder, single episode, unspecified: Secondary | ICD-10-CM | POA: Insufficient documentation

## 2017-12-28 DIAGNOSIS — M79652 Pain in left thigh: Secondary | ICD-10-CM

## 2017-12-28 DIAGNOSIS — Z794 Long term (current) use of insulin: Secondary | ICD-10-CM | POA: Diagnosis not present

## 2017-12-28 DIAGNOSIS — Z9889 Other specified postprocedural states: Secondary | ICD-10-CM | POA: Insufficient documentation

## 2017-12-28 DIAGNOSIS — N3281 Overactive bladder: Secondary | ICD-10-CM | POA: Diagnosis not present

## 2017-12-28 DIAGNOSIS — T782XXD Anaphylactic shock, unspecified, subsequent encounter: Secondary | ICD-10-CM | POA: Insufficient documentation

## 2017-12-28 DIAGNOSIS — F32A Anxiety disorder, unspecified: Secondary | ICD-10-CM

## 2017-12-28 LAB — POCT URINALYSIS DIPSTICK
Bilirubin, UA: NEGATIVE
Glucose, UA: NEGATIVE
Ketones, UA: NEGATIVE
Leukocytes, UA: NEGATIVE
Nitrite, UA: NEGATIVE
Spec Grav, UA: 1.025 (ref 1.010–1.025)
Urobilinogen, UA: 0.2 E.U./dL
pH, UA: 6 (ref 5.0–8.0)

## 2017-12-28 LAB — GLUCOSE, POCT (MANUAL RESULT ENTRY): POC Glucose: 205 mg/dl — AB (ref 70–99)

## 2017-12-28 LAB — POCT GLYCOSYLATED HEMOGLOBIN (HGB A1C): Hemoglobin A1C: 6.5

## 2017-12-28 MED ORDER — HYDROXYZINE HCL 25 MG PO TABS: 25.0000 mg | ORAL_TABLET | Freq: Three times a day (TID) | ORAL | 0 refills | Status: DC | PRN

## 2017-12-28 MED ORDER — OXYBUTYNIN CHLORIDE 5 MG PO TABS: 5.0000 mg | ORAL_TABLET | Freq: Three times a day (TID) | ORAL | 3 refills | Status: DC | PRN

## 2017-12-28 MED ORDER — INSULIN ASPART 100 UNIT/ML FLEXPEN: PEN_INJECTOR | SUBCUTANEOUS | 3 refills | Status: DC

## 2017-12-28 MED ORDER — MELOXICAM 7.5 MG PO TABS: 7.5000 mg | ORAL_TABLET | Freq: Every day | ORAL | 1 refills | Status: DC

## 2017-12-28 MED ORDER — AMLODIPINE BESYLATE 10 MG PO TABS: 10.0000 mg | ORAL_TABLET | Freq: Every day | ORAL | 3 refills | Status: DC

## 2017-12-28 MED ORDER — METHOCARBAMOL 750 MG PO TABS: 750.0000 mg | ORAL_TABLET | Freq: Three times a day (TID) | ORAL | 2 refills | Status: DC | PRN

## 2017-12-28 MED ORDER — SERTRALINE HCL 50 MG PO TABS: 50.0000 mg | ORAL_TABLET | Freq: Every day | ORAL | 3 refills | Status: DC

## 2017-12-28 NOTE — Progress Notes (Signed)
Subjective:  Patient ID: Amber Rose, female    DOB: August 19, 1976  Age: 42 y.o. MRN: [id number]  CC: Diabetes   HPI Amber Rose  is a 42 year old female with type 2 diabetes mellitus (A1c 6.5), hypertension, depression and anxiety, hydradenitis, tobacco abuse, fibromyalgia here for follow-up visit.  Her A1c is 6.5 which has trended up from 6.1 previously and she endorses fasting sugars in the 140 range lately but also endorses recent weight gain.  She has been compliant with her Bydureon, denies hypoglycemia or visual concerns. Of note she received steroid therapy in the last 3 months for arthralgia.  Currently followed by allergy and immunology due to angioedema and received Xolair shot which she states she had reactions to but then endorses improvement of her facial edema.  Her lisinopril had also been discontinued and so she is unsure if improvement is secondary to Xolair or discontinuation of ACE inhibitor. I had placed her on carvedilol in place of lisinopril and she had concerns about carvedilol worsening her diabetes mellitus. Currently being seen by GI and due to right foot pain.  Is being worked up for gastroparesis. Also saw the podiatrist recently and has been placed in the right leg boot  She is concerned about urinary symptoms which include frequency and inability to hold her urine, denies dysuria.  She is status post bladder surgeries in the past and was seen by urology however she is frustrated that her insurance will not pay for certain procedures because she is diabetic. Her multiple medical conditions results of anxiety and depression for her and she is currently seeing a therapist.  Past Medical History:  Diagnosis Date  . Allergy   . Anxiety   . Arthritis   . Chronic UTI   . Depression   . Diabetes mellitus without complication ([place])   . Fibromyalgia   . GERD (gastroesophageal reflux disease)   . Hyperlipidemia   . Hypertension   . Meningitis, viral     . Pancreatitis     Past Surgical History:  Procedure Laterality Date  . APPENDECTOMY  1990  . ceasarian  2002  . CESAREAN SECTION     x1  . CHOLECYSTECTOMY  2011  . COLONOSCOPY    . UPPER GASTROINTESTINAL ENDOSCOPY    . URINARY SURGERY     urethra sling, removal, and then revision 2016 (4 surgeries)  . WISDOM TOOTH EXTRACTION      Allergies  Allergen Reactions  . Metformin And Related Nausea And Vomiting  . Penicillins     childhood  . Xanax [Alprazolam]     "Caused upper respiratory symptoms" per pt  . Glyburide Other (See Comments)    "blood sugar dropped uncontrollably"  . Linagliptin Diarrhea and Other (See Comments)    Stomach pain, sinus infection  . Moxifloxacin Diarrhea     Outpatient Medications Prior to Visit  Medication Sig Dispense Refill  . acetaminophen (TYLENOL) 500 MG tablet Take 500 mg by mouth every 6 (six) hours as needed.    Marland Kitchen acyclovir (ZOVIRAX) 400 MG tablet Take 1 tablet (400 mg total) by mouth 2 (two) times daily. 60 tablet 2  . albuterol (PROAIR HFA) 108 (90 Base) MCG/ACT inhaler Inhale 2 puffs into the lungs every 4 (four) hours as needed for wheezing or shortness of breath. 1 Inhaler 1  . Blood Glucose Monitoring Suppl (ACCU-CHEK AVIVA PLUS) w/Device KIT USE AS DIRECTED DAILY. E11.9 1 kit 0  . carvedilol (COREG) 6.25 MG tablet Take 1 tablet (6.25 mg  total) by mouth 2 (two) times daily with a meal. 60 tablet 3  . Exenatide ER (BYDUREON) 2 MG PEN INJECT 2 MG INTO THE SKIN ONCE A WEEK. 12 each 3  . glucose blood (ACCU-CHEK AVIVA) test strip Use as instructed daily 100 each 12  . ibuprofen (ADVIL,MOTRIN) 600 MG tablet Take 600 mg by mouth every 6 (six) hours as needed.    . Insulin Pen Needle 31G X 5 MM MISC 1 each by Does not apply route 3 (three) times daily before meals. 90 each 1  . Lancet Devices (ACCU-CHEK SOFTCLIX) lancets Use as instructed daily. 1 each 5  . omeprazole (PRILOSEC) 40 MG capsule Take 1 tab every morning. 90 capsule 3  .  traZODone (DESYREL) 100 MG tablet Take 1 tablet (100 mg total) by mouth at bedtime as needed for sleep. 30 tablet 3  . amLODipine (NORVASC) 10 MG tablet Take 1 tablet (10 mg total) by mouth daily. 90 tablet 3  . insulin aspart (NOVOLOG FLEXPEN) 100 UNIT/ML FlexPen 0-12 units subcutaneously 3 times daily before meals (Patient taking differently: 0-12 units subcutaneously 3 times daily before meals, as needed) 15 mL 1  . meloxicam (MOBIC) 7.5 MG tablet Take 1 tablet (7.5 mg total) by mouth daily. 30 tablet 1  . methocarbamol (ROBAXIN) 750 MG tablet TAKE 1 TABLET BY MOUTH FOUR TIMES A DAY 30 tablet 0  . cetirizine (ZYRTEC) 10 MG tablet Take 10 mg by mouth daily.    . fluticasone (FLONASE) 50 MCG/ACT nasal spray USE 2 SPRAYS IN BOTH NOSTRILS DAILY  6  . podophyllum resin (PODOCON-25) 25 % SOLN Apply 1 mL topically once a week. To wart on L index finger (Patient not taking: Reported on 12/28/2017) 15 mL 0   Facility-Administered Medications Prior to Visit  Medication Dose Route Frequency Provider Last Rate Last Dose  . 0.9 %  sodium chloride infusion  500 mL Intravenous Once Pyrtle, Lajuan Lines, MD      . omalizumab Arvid Right) injection 300 mg  300 mg Subcutaneous Q28 days Valentina Shaggy, MD   300 mg at 12/06/17 1644    ROS Review of Systems  Constitutional: Negative for activity change, appetite change and fatigue.  HENT: Negative for congestion, sinus pressure and sore throat.   Eyes: Negative for visual disturbance.  Respiratory: Negative for cough, chest tightness, shortness of breath and wheezing.   Cardiovascular: Negative for chest pain and palpitations.  Gastrointestinal: Negative for abdominal distention, abdominal pain and constipation.  Endocrine: Negative for polydipsia.  Genitourinary:       See hpi  Musculoskeletal:       See hpi  Skin: Negative for rash.  Neurological: Negative for tremors, light-headedness and numbness.  Hematological: Does not bruise/bleed easily.    Psychiatric/Behavioral: Negative for agitation and behavioral problems.    Objective:  BP 108/71   Pulse 71   Temp 98.1 F (36.7 C) (Oral)   Ht '5\' 3"'  (1.6 m)   Wt 196 lb 6.4 oz (89.1 kg)   LMP 12/14/2017   SpO2 99%   BMI 34.79 kg/m   BP/Weight 12/28/2017 12/13/2017 1/61/0960  Systolic BP 454 098 119  Diastolic BP 71 78 78  Wt. (Lbs) 196.4 - 196.13  BMI 34.79 - 34.74      Physical Exam  Constitutional: She is oriented to person, place, and time. She appears well-developed and well-nourished.  Cardiovascular: Normal rate, normal heart sounds and intact distal pulses.  No murmur heard. Pulmonary/Chest: Effort normal and breath  sounds normal. She has no wheezes. She has no rales. She exhibits no tenderness.  Abdominal: Soft. Bowel sounds are normal. She exhibits no distension and no mass. There is no tenderness.  Musculoskeletal: Normal range of motion.  Left leg again in Cam boot  Neurological: She is alert and oriented to person, place, and time.     CMP Latest Ref Rng & Units 07/07/2017 10/28/2016 10/12/2016  Glucose 65 - 99 mg/dL - 95 144(H)  BUN 7 - 25 mg/dL - 13 9  Creatinine 0.50 - 1.10 mg/dL - 0.64 0.64  Sodium 135 - 146 mmol/L - 142 140  Potassium 3.5 - 5.3 mmol/L - 4.5 4.1  Chloride 98 - 110 mmol/L - 110 110  CO2 20 - 31 mmol/L - 23 23  Calcium 8.6 - 10.2 mg/dL - 9.1 8.6(L)  Total Protein 6.0 - 8.5 g/dL 7.0 6.6 -  Total Bilirubin 0.2 - 1.2 mg/dL - 0.2 -  Alkaline Phos 33 - 115 U/L - 75 -  AST 10 - 30 U/L - 11 -  ALT 6 - 29 U/L - 9 -    Lipid Panel     Component Value Date/Time   CHOL 198 10/28/2016 0851   TRIG 93 10/28/2016 0851   HDL 45 (L) 10/28/2016 0851   CHOLHDL 4.4 10/28/2016 0851    Lab Results  Component Value Date   HGBA1C 6.5 12/28/2017   . Assessment & Plan:   1. Diabetes mellitus without complication ([place]) Controlled with A1c of 6.5 which has trended up from 6.1 previously She attributes this to recent weight gain and  inactivity Work on Diabetic diet and lifestyle modifications and if A1C trends up further, I will adjust her regimen - did not do well with oral medications in the past - POCT glucose (manual entry) - POCT glycosylated hemoglobin (Hb A1C)  2. Anxiety and depression Due to multiple stressors and underlying medical conditions Commence on Zoloft and Hydroxyzine Continue therapy sessions - sertraline (ZOLOFT) 50 MG tablet; Take 1 tablet (50 mg total) by mouth daily.  Dispense: 30 tablet; Refill: 3 - hydrOXYzine (ATARAX/VISTARIL) 25 MG tablet; Take 1 tablet (25 mg total) by mouth 3 (three) times daily as needed.  Dispense: 30 tablet; Refill: 0 - meloxicam (MOBIC) 7.5 MG tablet; Take 1 tablet (7.5 mg total) by mouth daily.  Dispense: 30 tablet; Refill: 1  3. Hypertension, unspecified type Controlled Counseled on blood pressure goal of less than 130/80, low-sodium, DASH diet, medication compliance, 150 minutes of moderate intensity exercise per week. Discussed medication compliance, adverse effects. - amLODipine (NORVASC) 10 MG tablet; Take 1 tablet (10 mg total) by mouth daily.  Dispense: 90 tablet; Refill: 3  4. Pain in both thighs - methocarbamol (ROBAXIN) 750 MG tablet; Take 1 tablet (750 mg total) by mouth every 8 (eight) hours as needed for muscle spasms.  Dispense: 60 tablet; Refill: 2  5. Type 2 diabetes mellitus with other neurologic complication, without long-term current use of insulin (HCC) - insulin aspart (NOVOLOG FLEXPEN) 100 UNIT/ML FlexPen; 0-12 units subcutaneously 3 times daily before meals, as needed  Dispense: 15 mL; Refill: 3  6. Overactive bladder S/p multiple surgeries in the past UA is negative for UTI Placed on Ditropan, discussed kegel exercises - oxybutynin (DITROPAN) 5 MG tablet; Take 1 tablet (5 mg total) by mouth every 8 (eight) hours as needed for bladder spasms.  Dispense: 60 tablet; Refill: 3 - POCT urinalysis dipstick  7. Angioedema, subsequent  encounter Improved Continue Xolair injection  with Allergy and Immunology.   Meds ordered this encounter  Medications  . oxybutynin (DITROPAN) 5 MG tablet    Sig: Take 1 tablet (5 mg total) by mouth every 8 (eight) hours as needed for bladder spasms.    Dispense:  60 tablet    Refill:  3  . sertraline (ZOLOFT) 50 MG tablet    Sig: Take 1 tablet (50 mg total) by mouth daily.    Dispense:  30 tablet    Refill:  3  . hydrOXYzine (ATARAX/VISTARIL) 25 MG tablet    Sig: Take 1 tablet (25 mg total) by mouth 3 (three) times daily as needed.    Dispense:  30 tablet    Refill:  0  . meloxicam (MOBIC) 7.5 MG tablet    Sig: Take 1 tablet (7.5 mg total) by mouth daily.    Dispense:  30 tablet    Refill:  1  . amLODipine (NORVASC) 10 MG tablet    Sig: Take 1 tablet (10 mg total) by mouth daily.    Dispense:  90 tablet    Refill:  3  . methocarbamol (ROBAXIN) 750 MG tablet    Sig: Take 1 tablet (750 mg total) by mouth every 8 (eight) hours as needed for muscle spasms.    Dispense:  60 tablet    Refill:  2  . insulin aspart (NOVOLOG FLEXPEN) 100 UNIT/ML FlexPen    Sig: 0-12 units subcutaneously 3 times daily before meals, as needed    Dispense:  15 mL    Refill:  3    Follow-up: Return in about 3 months (around 03/30/2018) for follow up on chronic medical conditions.   Charlott Rakes MD

## 2017-12-29 ENCOUNTER — Telehealth: Payer: Self-pay | Admitting: Family Medicine

## 2017-12-29 ENCOUNTER — Encounter: Payer: Self-pay | Admitting: Family Medicine

## 2017-12-29 NOTE — Telephone Encounter (Signed)
Pt called to try to speak with the [facility] coordinator to please call her back

## 2017-12-30 NOTE — Telephone Encounter (Signed)
Call placed to pt. Pt shared that she was recently approved for disability. She is interested in initiating [place] and would like to know if PCP would concur.   Please advise.

## 2018-01-03 ENCOUNTER — Ambulatory Visit (INDEPENDENT_AMBULATORY_CARE_PROVIDER_SITE_OTHER): Payer: Medicaid Other | Admitting: *Deleted

## 2018-01-03 ENCOUNTER — Telehealth: Payer: Self-pay

## 2018-01-03 DIAGNOSIS — L500 Allergic urticaria: Secondary | ICD-10-CM | POA: Diagnosis not present

## 2018-01-03 NOTE — Telephone Encounter (Signed)
Signed PCS referral faxed to [facility] - fax # [phone]

## 2018-01-03 NOTE — Telephone Encounter (Signed)
Could you please refer for PCS services? Thanks.

## 2018-01-04 ENCOUNTER — Telehealth: Payer: Self-pay | Admitting: Family Medicine

## 2018-01-04 ENCOUNTER — Other Ambulatory Visit: Payer: Self-pay | Admitting: Family Medicine

## 2018-01-04 ENCOUNTER — Encounter: Payer: Self-pay | Admitting: Family Medicine

## 2018-01-04 MED ORDER — PREDNISONE 20 MG PO TABS: 20.0000 mg | ORAL_TABLET | Freq: Every day | ORAL | 0 refills | Status: DC

## 2018-01-04 NOTE — Telephone Encounter (Signed)
Call placed to [facility] [phone], regarding patient's referral for [facility] services. Spoke with Tobin Chad and he informed me that referral was not received. Informed him that I would be sending referral again.   Faxed patient's referral to [facility] [phone].

## 2018-01-04 NOTE — Progress Notes (Signed)
Prescription for prednisone sent to patient's pharmacy due to complaint on my chart of pain in thenar eminence and thumb of right hand, difficulty opening jars.

## 2018-01-05 ENCOUNTER — Telehealth: Payer: Self-pay

## 2018-01-05 NOTE — Telephone Encounter (Signed)
Call placed to [facility]. Spoke to [place] who confirmed the [facility] referral was received and they are ready to schedule an assessment.

## 2018-01-07 ENCOUNTER — Encounter: Payer: Self-pay | Admitting: Family Medicine

## 2018-01-07 ENCOUNTER — Encounter: Payer: Medicaid Other | Attending: Family Medicine | Admitting: Dietician

## 2018-01-07 ENCOUNTER — Encounter: Payer: Self-pay | Admitting: Dietician

## 2018-01-07 DIAGNOSIS — E1143 Type 2 diabetes mellitus with diabetic autonomic (poly)neuropathy: Secondary | ICD-10-CM | POA: Insufficient documentation

## 2018-01-07 DIAGNOSIS — Z6834 Body mass index (BMI) 34.0-34.9, adult: Secondary | ICD-10-CM | POA: Diagnosis not present

## 2018-01-07 DIAGNOSIS — K3184 Gastroparesis: Secondary | ICD-10-CM | POA: Diagnosis not present

## 2018-01-07 DIAGNOSIS — E119 Type 2 diabetes mellitus without complications: Secondary | ICD-10-CM

## 2018-01-07 DIAGNOSIS — Z713 Dietary counseling and surveillance: Secondary | ICD-10-CM | POA: Diagnosis not present

## 2018-01-07 NOTE — Patient Instructions (Signed)
Eat slowly.  Chew your food well. Continue to bake and boil.  Continue to limit all added fats. Meat should be very lean.  Most cooking methods are best.  Very tender. Avoid raw vegetables for now unless pureed. Consider making a soup every week and consider pureeing this. Consider a trial without dairy. Resume your smoothies. Start vitamin  D3. 2000 units daily. Consider Vitamin B-12 check.  Small meals.  Avoid overeating.  Avoid eating for 3-4 hours before bed.   You can get more benefit from your SNAP at the [facility] at [address], [place], [place] [address]

## 2018-01-07 NOTE — Progress Notes (Signed)
Medical Nutrition Therapy:  Appt start time: 1010 end time:  1126   Assessment:  Primary concerns today: Patient is here today alone and referred for gastroparesis.  She would like to learn what to eat to help with gastroparesis and type 2 Diabetes as well as any insight into allergies.  She has been having problems with swelling, more recently the face, and was taken off of Lisinopril.  Symptoms improved for two weeks but today woke up with swelling of the face again.  This effects her vision due to the extent of the swelling.  The swelling reduced during her visit with me today. She states that she had a gastric emptying study that low normal and has an appointment with her gastroenterologist April 1.  She has nausea 1-2 times per week and constipation or diarrhea intermittently.  She has been trying to follow many aspects of the gastroparesis diet provided by her doctor but finds this restrictive and does not go along with the previous diabetes guidelines.  Her diet remains high in fat. History includes GDM 2003, type 2 Diabetes 5 years ago, fibromyalgia, joint pian, nerve pain, HTN, hyperlipidemia, HTN and GERD.  She has increased depression and low energy related to the chronic health issues.  Her depression score was 21 today.  She continues to smoke and has taken Wellbutrin in the past and was able to quit.  She has Zoloft prescribed but has not started this yet.  She sees someone for the depression. She has problems sleeping and does not stay asleep.  She states that she may only get 3 hours of sleep per night.  Medications include Exenatide, and Novolog but just takes the Novolog when she is given a steroid.  She has concerns about the exenatide causing gastric issues but is afraid to stop this as it is working well for her blood sugar.  Labs noted to include A1C 5.9% 05/14/17, cholesterol 198, HDL 45, LDL 134, Triglycerides 93 (11/25/17).  Weight hx: 198 lbs today and states that 6 months ago  was 173 lbs.  She is frustrated about the 30 lb weight gain potentially due to inactivity, comfort foods, inflammation, fluid.  Patient lives alone most of the week and her daughter comes home on the weekend.  Her daughter is a Ship broker at [employer] as to have a more normal life away from her mother's illness.  She eats simply and eats out often.  She states that she is usually someone that looks at more natural ways of helping her symptoms but does not currently have the energy.  She was deathly allergic to dairy as a child and had allergies to strawberries and citrus as well but now eats these foods.  Preferred Learning Style:   No preference indicated   Learning Readiness:   Ready  DIETARY INTAKE: Less fried, less butter. Often eats grab and go.  Is trying to have healthier grabs.  Avoided foods include soda, artificial sweeteners   24-hr recall:  B ( AM): boiled egg OR bagel and cream cheese OR crackers OR skips OR juices cucumber, spinach, apple, frozen berries, water, ginger Snk ( AM): none  L ( PM): canned soup OR sauteed squash and zucchini with mashed potatoes or ramen noodles OR rice with hamberger and shrimp or Sandwich OR chips and lunch meat Snk ( PM): fruit or other grazing D ( PM): similar to lunch OR fast food if she eats healthier during the day Snk ( PM): bagel, cream cheese,  black olives (gyro sandwich and salad for dinner). Beverages: water, ginger tea, okra water, ginger water  Usual physical activity: none  Estimated energy needs: 1400 calories 175 g carbohydrates 70 g protein 45 g fat  Progress Towards Goal(s):  In progress.   Nutritional Diagnosis:  NB-1.1 Food and nutrition-related knowledge deficit As related to gastrparesis, diabetes, and food allergies.  As evidenced by patient report.    Intervention:  Nutrition counseling/education related to type 2 diabetes, gastroparesis symptoms, and concern of food allergies.  Discussed keeping a food  journal to document foods eaten and symptoms.  Discussed potential GI side effects of GLP1.  Discussed consideration of avoiding dairy and then adding this back to note any problems related to this.  Discussed importance of chewing food well, avoiding most raw foods for now.  Discussed importance of decreasing fat intake and eating soft meats or alternative protein sources.   Discussed basic diabetic diet guidelines.  Eat slowly.  Chew your food well. Continue to bake and boil.  Continue to limit all added fats. Meat should be very lean.  Most cooking methods are best.  Very tender. Avoid raw vegetables for now unless pureed. Consider making a soup every week and consider pureeing this. Consider a trial without dairy. Resume your smoothies. Start vitamin  D3. 2000 units daily. Consider Vitamin B-12 check. Small meals.  Avoid overeating.  Avoid eating for 3-4 hours before bed. You can get more benefit from your SNAP at the [facility] at [address], [place], [place] [address]  Teaching Method Utilized:  Visual Auditory Hands on  Handouts given during visit include:  Gastroparesis diet therapy from [facility]  Meal plan card and my plate  Elimination diet handout  Barriers to learning/adherence to lifestyle change: chronic illness  Demonstrated degree of understanding via:  Teach Back   Monitoring/Evaluation:  Dietary intake, exercise, and body weight prn.'

## 2018-01-11 ENCOUNTER — Telehealth: Payer: Self-pay | Admitting: Allergy & Immunology

## 2018-01-11 ENCOUNTER — Encounter: Payer: Self-pay | Admitting: *Deleted

## 2018-01-11 DIAGNOSIS — T783XXD Angioneurotic edema, subsequent encounter: Secondary | ICD-10-CM

## 2018-01-11 NOTE — Telephone Encounter (Signed)
Amber Rose contacted me to let me know that she is losing her Medicaid at the end of the month. Therefore, we will refer her to either [facility] or [facility] for evaluation of her angioedema.   Per patient request, we will order thyroid studies. We will work on getting a Publishing rights manager of [employer] (unsure if this will be covered).

## 2018-01-12 ENCOUNTER — Other Ambulatory Visit: Payer: Self-pay | Admitting: Family Medicine

## 2018-01-12 DIAGNOSIS — E1149 Type 2 diabetes mellitus with other diabetic neurological complication: Secondary | ICD-10-CM

## 2018-01-12 NOTE — Addendum Note (Signed)
Addended by: Valentina Shaggy on: 01/12/2018 09:51 AM   Modules accepted: Orders

## 2018-01-12 NOTE — Telephone Encounter (Signed)
I called [facility] and there first appointment time is June 1st. I also contacted [facility] and they had an appt for April 3, but due to the patients medicaid ending at the end of the month, she will become self pay. Self pay patients have to go through financial counseling before they can be scheduled. [facility] stated to have the patient just give them a call through out the next week to see if they have some cancellations before her insurance ends. Patient was informed of this by Lelan Pons. Also received authorization from the PCP to refer the patient out.

## 2018-01-12 NOTE — Telephone Encounter (Signed)
Spoke to patient  Expressed understanding of this referral process

## 2018-01-12 NOTE — Telephone Encounter (Signed)
Re-ordered thyroid studies through [facility]. Amber Rose is also working on getting her into [facility] for a second opinion. Amber Rose will reach out to Ms. Bugh to discuss her options. This is complicated by the fact that she is losing her Medicaid at the end of March.  Salvatore Marvel, MD [facility]

## 2018-01-13 LAB — SPECIMEN STATUS

## 2018-01-13 LAB — THYROID PANEL WITH TSH
Free Thyroxine Index: 2.1 (ref 1.2–4.9)
T3 Uptake Ratio: 27 % (ref 24–39)
T4, Total: 7.8 ug/dL (ref 4.5–12.0)
TSH: 0.853 u[IU]/mL (ref 0.450–4.500)

## 2018-01-17 ENCOUNTER — Encounter: Payer: Self-pay | Admitting: Family Medicine

## 2018-01-17 DIAGNOSIS — Z8619 Personal history of other infectious and parasitic diseases: Secondary | ICD-10-CM | POA: Insufficient documentation

## 2018-01-24 ENCOUNTER — Ambulatory Visit: Payer: Medicaid Other | Admitting: Internal Medicine

## 2018-01-25 ENCOUNTER — Ambulatory Visit: Payer: Medicaid Other | Admitting: Family Medicine

## 2018-01-28 ENCOUNTER — Telehealth: Payer: Self-pay | Admitting: Allergy & Immunology

## 2018-01-28 NOTE — Telephone Encounter (Signed)
Referral has been faxed to [facility]. Will follow back up in a few days if we do not hear from them.

## 2018-01-28 NOTE — Telephone Encounter (Signed)
I received an email from Ms. [place] with concerns about these swelling episodes being associated with her periods, which are rather painful in intensity. She is requesting a referral to Gynecology. Evidently she has requested one from her PCP but has had no response. Therefore we will place a referral to the [facility].   Our referral coordinator is placing the order and referral at this time. Patient updated with the plan.   Salvatore Marvel, MD [facility] [place] [facility] [place]

## 2018-01-31 ENCOUNTER — Ambulatory Visit: Payer: Self-pay

## 2018-02-01 ENCOUNTER — Ambulatory Visit: Payer: Self-pay | Attending: Family Medicine

## 2018-02-04 ENCOUNTER — Other Ambulatory Visit: Payer: Self-pay

## 2018-02-04 MED ORDER — ACYCLOVIR 800 MG PO TABS: 800.0000 mg | ORAL_TABLET | Freq: Once | ORAL | 2 refills | Status: DC

## 2018-02-07 ENCOUNTER — Other Ambulatory Visit: Payer: Self-pay

## 2018-02-09 ENCOUNTER — Ambulatory Visit: Payer: Self-pay | Attending: Family Medicine | Admitting: Family Medicine

## 2018-02-09 ENCOUNTER — Telehealth: Payer: Self-pay | Admitting: Family Medicine

## 2018-02-09 ENCOUNTER — Encounter: Payer: Self-pay | Admitting: Family Medicine

## 2018-02-09 VITALS — BP 114/73 | HR 76 | Temp 98.5°F | Ht 63.0 in | Wt 193.0 lb

## 2018-02-09 DIAGNOSIS — F419 Anxiety disorder, unspecified: Secondary | ICD-10-CM | POA: Insufficient documentation

## 2018-02-09 DIAGNOSIS — Z87440 Personal history of urinary (tract) infections: Secondary | ICD-10-CM | POA: Insufficient documentation

## 2018-02-09 DIAGNOSIS — N946 Dysmenorrhea, unspecified: Secondary | ICD-10-CM

## 2018-02-09 DIAGNOSIS — Z760 Encounter for issue of repeat prescription: Secondary | ICD-10-CM | POA: Insufficient documentation

## 2018-02-09 DIAGNOSIS — E785 Hyperlipidemia, unspecified: Secondary | ICD-10-CM | POA: Insufficient documentation

## 2018-02-09 DIAGNOSIS — T783XXA Angioneurotic edema, initial encounter: Secondary | ICD-10-CM | POA: Insufficient documentation

## 2018-02-09 DIAGNOSIS — Z794 Long term (current) use of insulin: Secondary | ICD-10-CM | POA: Insufficient documentation

## 2018-02-09 DIAGNOSIS — I10 Essential (primary) hypertension: Secondary | ICD-10-CM | POA: Insufficient documentation

## 2018-02-09 DIAGNOSIS — Z8661 Personal history of infections of the central nervous system: Secondary | ICD-10-CM | POA: Insufficient documentation

## 2018-02-09 DIAGNOSIS — F329 Major depressive disorder, single episode, unspecified: Secondary | ICD-10-CM | POA: Insufficient documentation

## 2018-02-09 DIAGNOSIS — K219 Gastro-esophageal reflux disease without esophagitis: Secondary | ICD-10-CM | POA: Insufficient documentation

## 2018-02-09 DIAGNOSIS — J018 Other acute sinusitis: Secondary | ICD-10-CM

## 2018-02-09 DIAGNOSIS — L732 Hidradenitis suppurativa: Secondary | ICD-10-CM | POA: Insufficient documentation

## 2018-02-09 DIAGNOSIS — Z720 Tobacco use: Secondary | ICD-10-CM | POA: Insufficient documentation

## 2018-02-09 DIAGNOSIS — M797 Fibromyalgia: Secondary | ICD-10-CM | POA: Insufficient documentation

## 2018-02-09 DIAGNOSIS — K449 Diaphragmatic hernia without obstruction or gangrene: Secondary | ICD-10-CM | POA: Insufficient documentation

## 2018-02-09 DIAGNOSIS — Z79899 Other long term (current) drug therapy: Secondary | ICD-10-CM | POA: Insufficient documentation

## 2018-02-09 DIAGNOSIS — Z880 Allergy status to penicillin: Secondary | ICD-10-CM | POA: Insufficient documentation

## 2018-02-09 DIAGNOSIS — E119 Type 2 diabetes mellitus without complications: Secondary | ICD-10-CM | POA: Insufficient documentation

## 2018-02-09 LAB — GLUCOSE, POCT (MANUAL RESULT ENTRY): POC Glucose: 127 mg/dl — AB (ref 70–99)

## 2018-02-09 MED ORDER — DOXYCYCLINE HYCLATE 100 MG PO TABS: 100.0000 mg | ORAL_TABLET | Freq: Two times a day (BID) | ORAL | 0 refills | Status: DC

## 2018-02-09 MED ORDER — FLUCONAZOLE 150 MG PO TABS: 150.0000 mg | ORAL_TABLET | Freq: Once | ORAL | 0 refills | Status: AC

## 2018-02-09 MED FILL — DOXYCYCLINE HYCLATE 100 MG: 100 | 10 days supply | Qty: 20 | Fill #0

## 2018-02-09 MED FILL — FLUCONAZOLE 150 MG TABLET: 150 | 1 days supply | Qty: 1 | Fill #0

## 2018-02-09 MED FILL — BYDUREON 2 MG PEN INJECT: 2 | 28 days supply | Qty: 4 | Fill #0

## 2018-02-09 NOTE — Telephone Encounter (Signed)
Could you please check with this patient to see about this referral as she is in the process of relocating to [place]. What would the referral to GYN be if she so desires?  Thank you

## 2018-02-09 NOTE — Progress Notes (Signed)
Patient is here today to inform you that she is moving and would like to discuss how to transition to a new PCP.

## 2018-02-09 NOTE — Telephone Encounter (Signed)
[facility] call to ask the pcp to refer this Pt to a GYN specialist, please follow up, Pt has [employer]

## 2018-02-09 NOTE — Patient Instructions (Signed)

## 2018-02-09 NOTE — Progress Notes (Signed)
Subjective:  Patient ID: Amber Rose, female    DOB: 1975/12/15  Age: 42 y.o. MRN: [id number]  CC: Medication Refill   HPI Amber Rose  is a 42 year old female with type 2 diabetes mellitus (A1c 6.5), hypertension, depression and anxiety, hydradenitis, tobacco abuse, fibromyalgia here for follow-up visit. Currently followed by allergy and immunology due to angioedema and received Xolair shot, endorses improvement of her facial edema.  She presents today to inform me she will be relocating to [place] as her medicaid has been discontinued and she is having a difficult time coping here in [place] but has support in [place]. She would like to obtain a #90 day supply of meds and ensure she receives her medical records prior to establishing with a new PCP there. She complains of nasal congestion, facial edema, sinus pressure, post nasal drip which have been present for the last 2 months. She denies the presence fever. She is currently on Zyrtec and Flonase and informs me she has still noticed her face swelling.  Past Medical History:  Diagnosis Date  . Allergy   . Anxiety   . Arthritis   . Chronic UTI   . Depression   . Diabetes mellitus without complication ([place])   . Diverticulosis   . Fibromyalgia   . GERD (gastroesophageal reflux disease)   . Hiatal hernia   . Hyperlipidemia   . Hypertension   . Internal hemorrhoids   . Meningitis, viral   . Pancreatitis     Past Surgical History:  Procedure Laterality Date  . APPENDECTOMY  1990  . ceasarian  2002  . CESAREAN SECTION     x1  . CHOLECYSTECTOMY  2011  . COLONOSCOPY    . UPPER GASTROINTESTINAL ENDOSCOPY    . URINARY SURGERY     urethra sling, removal, and then revision 2016 (4 surgeries)  . WISDOM TOOTH EXTRACTION      Allergies  Allergen Reactions  . Metformin And Related Nausea And Vomiting  . Penicillins     childhood  . Xanax [Alprazolam]     "Caused upper respiratory symptoms" per pt  . Glyburide  Other (See Comments)    "blood sugar dropped uncontrollably"  . Linagliptin Diarrhea and Other (See Comments)    Stomach pain, sinus infection  . Moxifloxacin Diarrhea     Outpatient Medications Prior to Visit  Medication Sig Dispense Refill  . acetaminophen (TYLENOL) 500 MG tablet Take 500 mg by mouth every 6 (six) hours as needed.    Marland Kitchen acyclovir (ZOVIRAX) 400 MG tablet TAKE 1 TABLET BY MOUTH TWICE A DAY 60 tablet 2  . albuterol (PROAIR HFA) 108 (90 Base) MCG/ACT inhaler Inhale 2 puffs into the lungs every 4 (four) hours as needed for wheezing or shortness of breath. 1 Inhaler 1  . amLODipine (NORVASC) 10 MG tablet Take 1 tablet (10 mg total) by mouth daily. 90 tablet 3  . Blood Glucose Monitoring Suppl (ACCU-CHEK AVIVA PLUS) w/Device KIT USE AS DIRECTED DAILY. E11.9 1 kit 0  . carvedilol (COREG) 6.25 MG tablet Take 1 tablet (6.25 mg total) by mouth 2 (two) times daily with a meal. 60 tablet 3  . cetirizine (ZYRTEC) 10 MG tablet Take 10 mg by mouth daily.    . Exenatide ER (BYDUREON) 2 MG PEN INJECT 2 MG INTO THE SKIN ONCE A WEEK. 12 each 3  . fluticasone (FLONASE) 50 MCG/ACT nasal spray USE 2 SPRAYS IN BOTH NOSTRILS DAILY  6  . glucose blood (ACCU-CHEK AVIVA) test strip Use  as instructed daily 100 each 12  . hydrOXYzine (ATARAX/VISTARIL) 25 MG tablet Take 1 tablet (25 mg total) by mouth 3 (three) times daily as needed. 30 tablet 0  . ibuprofen (ADVIL,MOTRIN) 600 MG tablet Take 600 mg by mouth every 6 (six) hours as needed.    . Insulin Pen Needle 31G X 5 MM MISC 1 each by Does not apply route 3 (three) times daily before meals. 90 each 1  . Lancet Devices (ACCU-CHEK SOFTCLIX) lancets Use as instructed daily. 1 each 5  . meloxicam (MOBIC) 7.5 MG tablet Take 1 tablet (7.5 mg total) by mouth daily. 30 tablet 1  . methocarbamol (ROBAXIN) 750 MG tablet Take 1 tablet (750 mg total) by mouth every 8 (eight) hours as needed for muscle spasms. 60 tablet 2  . omeprazole (PRILOSEC) 40 MG capsule  Take 1 tab every morning. 90 capsule 3  . oxybutynin (DITROPAN) 5 MG tablet Take 1 tablet (5 mg total) by mouth every 8 (eight) hours as needed for bladder spasms. 60 tablet 3  . sertraline (ZOLOFT) 50 MG tablet Take 1 tablet (50 mg total) by mouth daily. 30 tablet 3  . traZODone (DESYREL) 100 MG tablet Take 1 tablet (100 mg total) by mouth at bedtime as needed for sleep. 30 tablet 3  . insulin aspart (NOVOLOG FLEXPEN) 100 UNIT/ML FlexPen 0-12 units subcutaneously 3 times daily before meals, as needed (Patient not taking: Reported on 01/07/2018) 15 mL 3  . podophyllum resin (PODOCON-25) 25 % SOLN Apply 1 mL topically once a week. To wart on L index finger (Patient not taking: Reported on 12/28/2017) 15 mL 0  . predniSONE (DELTASONE) 20 MG tablet Take 1 tablet (20 mg total) by mouth daily with breakfast. (Patient not taking: Reported on 01/07/2018) 5 tablet 0   Facility-Administered Medications Prior to Visit  Medication Dose Route Frequency Provider Last Rate Last Dose  . 0.9 %  sodium chloride infusion  500 mL Intravenous Once Pyrtle, Lajuan Lines, MD      . omalizumab Arvid Right) injection 300 mg  300 mg Subcutaneous Q28 days Valentina Shaggy, MD   300 mg at 01/03/18 1210    ROS Review of Systems  Constitutional: Negative for activity change, appetite change and fatigue.  HENT: Positive for congestion, facial swelling, postnasal drip, sinus pressure and voice change. Negative for sore throat.   Eyes: Negative for visual disturbance.  Respiratory: Negative for cough, chest tightness, shortness of breath and wheezing.   Cardiovascular: Negative for chest pain and palpitations.  Gastrointestinal: Negative for abdominal distention, abdominal pain and constipation.  Endocrine: Negative for polydipsia.  Genitourinary: Negative for dysuria and frequency.  Musculoskeletal: Negative for arthralgias and back pain.  Skin: Negative for rash.  Neurological: Negative for tremors, light-headedness and numbness.    Hematological: Does not bruise/bleed easily.  Psychiatric/Behavioral: Negative for agitation and behavioral problems.    Objective:  BP 114/73   Pulse 76   Temp 98.5 F (36.9 C) (Oral)   Ht '5\' 3"'$  (1.6 m)   Wt 193 lb (87.5 kg)   SpO2 98%   BMI 34.19 kg/m   BP/Weight 02/09/2018 3/38/2505 01/02/7672  Systolic BP 419 - 379  Diastolic BP 73 - 71  Wt. (Lbs) 193 198 196.4  BMI 34.19 34.52 34.79      Physical Exam  Constitutional: She is oriented to person, place, and time. She appears well-developed and well-nourished.  HENT:  Nasal speech Normal TM b/l Slight oropharyngeal erythema with post nasal drip Slight  R maxillary sinus TTP  Cardiovascular: Normal rate, normal heart sounds and intact distal pulses.  No murmur heard. Pulmonary/Chest: Effort normal and breath sounds normal. She has no wheezes. She has no rales. She exhibits no tenderness.  Abdominal: Soft. Bowel sounds are normal. She exhibits no distension and no mass. There is no tenderness.  Musculoskeletal: Normal range of motion.  Neurological: She is alert and oriented to person, place, and time.     Lab Results  Component Value Date   HGBA1C 6.5 12/28/2017    Assessment & Plan:   1. Diabetes mellitus without complication ([place]) Controlled Continue current regimen She will be moving to [facility] on 04/15/18 and has been advised to call the office in the first week of June so a 90 day refill can be provided and she will be discussing the process of transferring her records on discharge today - POCT glucose (manual entry)  2. Acute non-recurrent sinusitis of other sinus Treated with antibiotics given underlying comorbidities - doxycycline (VIBRA-TABS) 100 MG tablet; Take 1 tablet (100 mg total) by mouth 2 (two) times daily.  Dispense: 20 tablet; Refill: 0   Meds ordered this encounter  Medications  . fluconazole (DIFLUCAN) 150 MG tablet    Sig: Take 1 tablet (150 mg total) by mouth once for 1 dose.     Dispense:  1 tablet    Refill:  0  . doxycycline (VIBRA-TABS) 100 MG tablet    Sig: Take 1 tablet (100 mg total) by mouth 2 (two) times daily.    Dispense:  20 tablet    Refill:  0    Follow-up: Return for follow up of chronic medical conditions, patient will establish with new PCP once she relocates.   Charlott Rakes MD

## 2018-02-10 ENCOUNTER — Encounter: Payer: Self-pay | Admitting: Family Medicine

## 2018-02-10 ENCOUNTER — Telehealth: Payer: Self-pay | Admitting: *Deleted

## 2018-02-10 NOTE — Telephone Encounter (Signed)
Amber Rose came into the office asking for her blood test results that she had done three weeks ago for HAE. [name] and I both looked in her chart but didn't see anything other than the tyroid panels. [name] also stated that she feels as though the Zertec is not working but she has tried Furniture conservator/restorer and thinks it works better. She is wondering if you can call some into the [facility] and [facility] for her.   ([address], [address], [place] [address] [phone])

## 2018-02-10 NOTE — Telephone Encounter (Signed)
Sure we can send that in. Please send in loratadine 10mg  twice daily.  I have not received that test back yet. Please check with Rachelle.  Salvatore Marvel, MD [facility] [place]

## 2018-02-10 NOTE — Telephone Encounter (Signed)
Spoke to patient advised to buy otc loratadine due to insurance not paying for it. Also Rachelle lab tech is calling [facility] to check on status. Patient verbalized understanding.

## 2018-02-12 LAB — OTHER LAB TEST

## 2018-02-12 LAB — SPECIMEN STATUS REPORT

## 2018-02-14 NOTE — Telephone Encounter (Signed)
Dr Ernst Bowler spoke to patient in regards to result. Results are being sent to scan center

## 2018-02-14 NOTE — Telephone Encounter (Signed)
Patient was called and patient is aware of referral request and would like to be sent to a GYN.

## 2018-02-14 NOTE — Telephone Encounter (Signed)
Referral has been placed. 

## 2018-02-17 ENCOUNTER — Encounter: Payer: Self-pay | Admitting: Family Medicine

## 2018-02-18 ENCOUNTER — Other Ambulatory Visit: Payer: Self-pay | Admitting: Family Medicine

## 2018-02-18 DIAGNOSIS — R1900 Intra-abdominal and pelvic swelling, mass and lump, unspecified site: Secondary | ICD-10-CM

## 2018-02-21 ENCOUNTER — Ambulatory Visit (HOSPITAL_COMMUNITY): Admission: RE | Admit: 2018-02-21 | Payer: Self-pay | Source: Ambulatory Visit

## 2018-02-21 ENCOUNTER — Other Ambulatory Visit: Payer: Self-pay | Admitting: Podiatry

## 2018-02-21 ENCOUNTER — Ambulatory Visit (HOSPITAL_COMMUNITY)
Admission: RE | Admit: 2018-02-21 | Discharge: 2018-02-21 | Disposition: A | Discharge status: Home / Self Care | Payer: Self-pay | Source: Ambulatory Visit | Attending: Family Medicine | Admitting: Family Medicine

## 2018-02-21 ENCOUNTER — Encounter (HOSPITAL_COMMUNITY): Payer: Self-pay

## 2018-02-21 DIAGNOSIS — R1900 Intra-abdominal and pelvic swelling, mass and lump, unspecified site: Secondary | ICD-10-CM | POA: Insufficient documentation

## 2018-02-22 ENCOUNTER — Telehealth: Payer: Self-pay | Admitting: Allergy & Immunology

## 2018-02-22 ENCOUNTER — Encounter: Payer: Self-pay | Admitting: Physician Assistant

## 2018-02-22 ENCOUNTER — Encounter: Payer: Self-pay | Admitting: Family Medicine

## 2018-02-22 DIAGNOSIS — K567 Ileus, unspecified: Secondary | ICD-10-CM

## 2018-02-22 NOTE — Telephone Encounter (Signed)
I received an email from Ms. Zertuche, copied and pasted below:  Hi there -   That fits with your history since you have had two negative blood environmental allergy tests. I was never convinced that this has anything to do with allergies, but it always seemed responsive to antihistamines so we went with that. This why I thought the Arvid Right would work as well.   That being said, I am not well versed in anything cancer related, so if you are truly concerned about an oncologic process, I think it would be best for you to see your primary care provider for an evaluation. They evaluate this all of the time.   Salvatore Marvel  From: Elsie Amis [email or web address]>  Sent: Sunday, February 20, 2018 4:24 PM To: Salvatore [email or web address]> Subject: [External Email]Not environmental allergies  *Caution - External email - see footer for warnings*  So Doctor today I feel ok as i have the past few days  since coming off my cycle. During the 10 days or so of menstruation I was in a full blown flare up as usual. Today I went on my porch and cleaned all the green pollen off my furniture and no reaction. So this tells me again that this is not allergies. This is related to my menstrual cycle and laying down. I'm including a picture of all the dander. I would be having a reaction if i was allergic right?  Well the reaction is very mild nothing like the head & eye pain or swelling  I have right before during & right after my period. Hormones and the pressure from laying down seem to be the main components of this thing I have, whatever it is. Oh and now let's add the swollen lymph nodes in my abdomen.  I won't be able to go to any big teaching hospitals until August when my Medicare kicks in. Idk why but I fear this is a undiscovered tumor or something. Can that be looked into any deeper?  Honestly I'm ready to give up smh.

## 2018-02-22 NOTE — Telephone Encounter (Signed)
Please schedule this patient for a CT abdomen and pelvis as follow-up to her complaints and please inform her I received her email which was forwarded by her allergist.

## 2018-02-24 ENCOUNTER — Ambulatory Visit: Payer: Self-pay | Attending: Family Medicine

## 2018-02-24 DIAGNOSIS — E119 Type 2 diabetes mellitus without complications: Secondary | ICD-10-CM | POA: Insufficient documentation

## 2018-02-24 NOTE — Telephone Encounter (Signed)
Patient was called and informed of CT scan appointment and patient was also informed that blood work needs to be done prior to CT scan.

## 2018-02-24 NOTE — Progress Notes (Signed)
Patient here for lab visit only 

## 2018-02-25 LAB — CMP14+EGFR
ALT: 12 IU/L (ref 0–32)
AST: 11 IU/L (ref 0–40)
Albumin/Globulin Ratio: 1.4 (ref 1.2–2.2)
Albumin: 3.9 g/dL (ref 3.5–5.5)
Alkaline Phosphatase: 93 IU/L (ref 39–117)
BUN/Creatinine Ratio: 12 (ref 9–23)
BUN: 8 mg/dL (ref 6–24)
Bilirubin Total: <0.2 mg/dL (ref 0.0–1.2)
CO2: 20 mmol/L (ref 20–29)
Calcium: 9.4 mg/dL (ref 8.7–10.2)
Chloride: 104 mmol/L (ref 96–106)
Creatinine, Ser: 0.65 mg/dL (ref 0.57–1.00)
GFR calc Af Amer: 127 mL/min/{1.73_m2} (ref >59)
GFR calc non Af Amer: 110 mL/min/{1.73_m2} (ref >59)
Globulin, Total: 2.8 g/dL (ref 1.5–4.5)
Glucose: 90 mg/dL (ref 65–99)
Potassium: 4.6 mmol/L (ref 3.5–5.2)
Sodium: 141 mmol/L (ref 134–144)
Total Protein: 6.7 g/dL (ref 6.0–8.5)

## 2018-02-28 ENCOUNTER — Encounter (HOSPITAL_COMMUNITY): Payer: Self-pay

## 2018-02-28 ENCOUNTER — Ambulatory Visit (HOSPITAL_COMMUNITY)
Admission: RE | Admit: 2018-02-28 | Discharge: 2018-02-28 | Disposition: A | Discharge status: Home / Self Care | Payer: Self-pay | Source: Ambulatory Visit | Attending: Family Medicine | Admitting: Family Medicine

## 2018-02-28 ENCOUNTER — Encounter: Payer: Self-pay | Admitting: Family Medicine

## 2018-02-28 DIAGNOSIS — K567 Ileus, unspecified: Secondary | ICD-10-CM | POA: Insufficient documentation

## 2018-02-28 DIAGNOSIS — I700 Atherosclerosis of aorta: Secondary | ICD-10-CM | POA: Insufficient documentation

## 2018-02-28 DIAGNOSIS — Z9049 Acquired absence of other specified parts of digestive tract: Secondary | ICD-10-CM | POA: Insufficient documentation

## 2018-02-28 MED ORDER — IOHEXOL 300 MG/ML  SOLN
100.0000 mL | Freq: Once | INTRAMUSCULAR | Status: AC | PRN
  Administered 2018-02-28: 100 mL via INTRAVENOUS

## 2018-03-01 ENCOUNTER — Ambulatory Visit (HOSPITAL_COMMUNITY): Payer: Self-pay

## 2018-03-01 ENCOUNTER — Ambulatory Visit: Payer: Medicaid Other | Admitting: Internal Medicine

## 2018-03-02 MED FILL — CARVEDILOL 6.25 MG TABLET: 6.25 | 30 days supply | Qty: 60 | Fill #0 | Status: TO

## 2018-03-02 MED FILL — ACYCLOVIR 800 MG TABLET: 800 | 30 days supply | Qty: 30 | Fill #0

## 2018-03-03 ENCOUNTER — Encounter: Payer: Self-pay | Admitting: Internal Medicine

## 2018-03-03 ENCOUNTER — Ambulatory Visit (INDEPENDENT_AMBULATORY_CARE_PROVIDER_SITE_OTHER): Payer: Self-pay | Admitting: Internal Medicine

## 2018-03-03 ENCOUNTER — Encounter: Payer: Self-pay | Admitting: Family Medicine

## 2018-03-03 VITALS — BP 130/80 | HR 80 | Ht 63.5 in | Wt 190.5 lb

## 2018-03-03 DIAGNOSIS — K3184 Gastroparesis: Secondary | ICD-10-CM

## 2018-03-03 MED ORDER — METOCLOPRAMIDE HCL 5 MG PO TABS: 5.0000 mg | ORAL_TABLET | Freq: Three times a day (TID) | ORAL | 3 refills | Status: DC

## 2018-03-03 MED FILL — ?METOCLOPRAMIDE 5 MG TABLET: 5 | 30 days supply | Qty: 120 | Fill #0 | Status: TO

## 2018-03-03 NOTE — Progress Notes (Signed)
Subjective:    Patient ID: Amber Rose, female    DOB: 1976-01-12, 42 y.o.   MRN: [id number]  HPI Bridget Woodlawn is a 42 year old female with a history of GERD, probable gastroparesis, diabetes requiring insulin, degenerative disc disease who is seen in follow-up.  She was last seen on 12/13/2017 by Nicoletta Ba, PA-C.  At that visit she had undergone upper endoscopy in January which showed a 2 cm hiatal hernia, and a large amount of food in the stomach consistent with gastroparesis.  Biopsies from the esophagus were negative for EOE.  She has remained on omeprazole which has been helping with reflux.  After this visit with Amy she had a gastric emptying study which was normal.  Despite this she has continued to have issues with frequent upper abdominal bloating, pain, tightness and nausea.  Rare vomiting.  Worse after eating.  She has been trying to follow a gastroparesis diet but this is at times hard for her with her diabetes.  Bowel habits vary from constipation to loose stools.  At times she feels that food comes through in her stool undigested.  Glucoses have been creeping up lately and she has gained some weight.  Her A1c has increased to 6.9.  She has noticed nodules under her skin primarily mid abdomen to the left and right of the umbilicus.  They seem to come and go.  This prompted primary care to order a CT scan of the abdomen and pelvis which was performed 3 days ago. This CT scan showed no acute findings in the abdomen or pelvis.  Status post cholecystectomy.  She is also been dealing with periorbital swelling as well as some facial swelling, emotional liability, cloudy thinking and headaches.  Etiology yet to be determined.  She is undergone multiple allergy assessments which has not proved this to be an allergy.  She states it is very distinctly tied to her menstrual cycle.  Symptoms start 3 days before her menstrual period and and on the last day of her cycle.  She does have a  GYN visit with Dr. Ilda Basset scheduled for June 20.    Review of Systems As per HPI, otherwise negative  Current Medications, Allergies, Past Medical History, Past Surgical History, Family History and Social History were reviewed in [employer] record.     Objective:   Physical Exam  BP 130/80 (BP Location: Left Arm, Patient Position: Sitting, Cuff Size: Normal)   Pulse 80   Ht 5' 3.5" (1.613 m) Comment: height measured without shoes  Wt 190 lb 8 oz (86.4 kg)   LMP 02/23/2018   BMI 33.22 kg/m  Constitutional: Well-developed and well-nourished. No distress. HEENT: Normocephalic and atraumatic.  Conjunctivae are normal.  No scleral icterus. Neck: Neck supple. Trachea midline. Cardiovascular: Normal rate, regular rhythm and intact distal pulses. No M/R/G Pulmonary/chest: Effort normal and breath sounds normal. No wheezing, rales or rhonchi. Abdominal: Soft, nontender, nondistended. Bowel sounds active throughout. There are no masses palpable. No hepatosplenomegaly. Extremities: no clubbing, cyanosis, or edema Neurological: Alert and oriented to person place and time. Skin: Skin is warm and dry. Psychiatric: Normal mood and affect. Behavior is normal.  CMP     Component Value Date/Time   NA 141 02/24/2018 1041   K 4.6 02/24/2018 1041   CL 104 02/24/2018 1041   CO2 20 02/24/2018 1041   GLUCOSE 90 02/24/2018 1041   GLUCOSE 95 10/28/2016 0851   BUN 8 02/24/2018 1041   CREATININE 0.65 02/24/2018 1041  CREATININE 0.64 10/28/2016 0851   CALCIUM 9.4 02/24/2018 1041   PROT 6.7 02/24/2018 1041   ALBUMIN 3.9 02/24/2018 1041   AST 11 02/24/2018 1041   ALT 12 02/24/2018 1041   ALKPHOS 93 02/24/2018 1041   BILITOT <0.2 02/24/2018 1041   GFRNONAA 110 02/24/2018 1041   GFRNONAA >89 10/28/2016 0851   GFRAA 127 02/24/2018 1041   GFRAA >89 10/28/2016 0851     ABDOMEN - 2 VIEW   COMPARISON:  None in PACs   FINDINGS: There is gas within the stomach and a few  loops of minimally distended small bowel to the left of midline. The colonic stool burden appears normal. There is no evidence of a fecal impaction. No free extraluminal gas collections are observed. The bony structures exhibit no acute abnormalities. There are numerous surgical clips to the right of midline in the mid abdomen.   IMPRESSION: Possible mild small bowel ileus. No evidence of obstruction or perforation.     Electronically Signed   By: David  Martinique M.D.   On: 02/22/2018 07:58     CT ABDOMEN AND PELVIS WITH CONTRAST   TECHNIQUE: Multidetector CT imaging of the abdomen and pelvis was performed using the standard protocol following bolus administration of intravenous contrast.   CONTRAST:  198mL OMNIPAQUE IOHEXOL 300 MG/ML  SOLN   COMPARISON:  No priors.   FINDINGS: Lower chest: Unremarkable.   Hepatobiliary: No cystic or solid hepatic lesions. No intra or extrahepatic biliary ductal dilatation. Status post cholecystectomy.   Pancreas: No pancreatic mass. No pancreatic ductal dilatation. No pancreatic or peripancreatic fluid or inflammatory changes.   Spleen: Unremarkable.   Adrenals/Urinary Tract: Right-sided extra renal pelvis (normal anatomical variant) incidentally noted. Bilateral kidneys and bilateral adrenal glands are otherwise unremarkable in appearance. No hydroureteronephrosis. Urinary bladder is normal in appearance.   Stomach/Bowel: Normal appearance of the stomach. No pathologic dilatation of small bowel or colon. The appendix is not confidently identified and may be surgically absent. Regardless, there are no inflammatory changes noted adjacent to the cecum to suggest the presence of an acute appendicitis at this time.   Vascular/Lymphatic: Aortic atherosclerosis, without evidence of aneurysm or dissection in the abdominal or pelvic vasculature. Retroaortic left renal vein (normal anatomical variant) incidentally noted. No lymphadenopathy  noted in the abdomen or pelvis.   Reproductive: Tampon present in the vagina. Uterus and ovaries are unremarkable in appearance.   Other: No significant volume of ascites.  No pneumoperitoneum.   Musculoskeletal: There are no aggressive appearing lytic or blastic lesions noted in the visualized portions of the skeleton.   IMPRESSION: 1. No acute findings are noted in the abdomen or pelvis to account for the patient's symptoms. 2. Aortic atherosclerosis. 3. Status post cholecystectomy. 4. Additional incidental findings, as above.   Aortic Atherosclerosis (ICD10-I70.0).     Electronically Signed   By: Vinnie Langton M.D.   On: 03/01/2018 09:31        Assessment & Plan:  42 year old female with a history of GERD, probable gastroparesis, diabetes requiring insulin, degenerative disc disease who is seen in follow-up.    1.  Gastroparesis --despite her negative gastric emptying study I feel that she has gastroparesis.  This is based on findings at endoscopy along with her classic symptoms (upper abdominal pain, bloating, nausea, early fullness worsened by eating).  She also has diabetes which is a very well known risk factor.  She had a plain film performed of the abdomen about 10 days ago which showed gas  in the stomach and proximal small bowel which likely relates to gastroparesis.  I recommended that she continue gastroparesis diet.  I am going to try her on metoclopramide 5 mg 3 times daily before meals and at bedtime.  We reviewed that metoclopramide can have a long-term risk of neurologic side effects including tardive dyskinesia.  I would like her to use this as infrequently as possible, take drug holidays when possible and remain on the lowest effective dose.  I would like to see her back in 3 months to determine if this has been beneficial for her and if so we can discuss ongoing therapy or even possibly domperidone.  I also encouraged her to work on controlling her blood sugars  as tightly as possible.  2.  Recurrent episodes periorbital edema/headache --tightly related to menstrual cycle.  Raises the question of relation to hormones.  She is seeing GYN as a next step.  Her 24-hour urine 5 HIAA was normal.    3. GERD --well-controlled continue omeprazole 40 mg daily.  25 minutes spent with the patient today. Greater than 50% was spent in counseling and coordination of care with the patient

## 2018-03-03 NOTE — Patient Instructions (Signed)
If you are age 42 or older, your body mass index should be between 23-30. Your Body mass index is 33.22 kg/m. If this is out of the aforementioned range listed, please consider follow up with your Primary Care Provider.  If you are age 24 or younger, your body mass index should be between 19-25. Your Body mass index is 33.22 kg/m. If this is out of the aformentioned range listed, please consider follow up with your Primary Care Provider.   We have sent the following medications to your pharmacy for you to pick up at your convenience: Reglan 5 mg  Stay on Prilosec.  Follow gastric paresis diet.  Follow up with me in three months.  Please call the office in three months for an appointment as the schedule is not available at this time.  Thank you for choosing me and [facility].   Dr. Billie Lade

## 2018-03-04 MED FILL — $BYDUERON 2MG PEN INJECT: 2 | 28 days supply | Qty: 4 | Fill #1

## 2018-03-07 ENCOUNTER — Other Ambulatory Visit: Payer: Self-pay | Admitting: Family Medicine

## 2018-03-07 DIAGNOSIS — R232 Flushing: Secondary | ICD-10-CM

## 2018-03-08 MED FILL — $novoLOG FLEXPEN SYRINGE: 100 | 25 days supply | Qty: 9 | Fill #0 | Status: TO

## 2018-03-18 MED FILL — AMLODIPINE BESYLATE 10 MG T: 10 | 30 days supply | Qty: 30 | Fill #0 | Status: TO

## 2018-03-23 ENCOUNTER — Ambulatory Visit (INDEPENDENT_AMBULATORY_CARE_PROVIDER_SITE_OTHER): Payer: Self-pay | Admitting: Obstetrics & Gynecology

## 2018-03-23 ENCOUNTER — Encounter: Payer: Self-pay | Admitting: Obstetrics & Gynecology

## 2018-03-23 VITALS — BP 174/86 | HR 72 | Wt 194.6 lb

## 2018-03-23 DIAGNOSIS — N920 Excessive and frequent menstruation with regular cycle: Secondary | ICD-10-CM

## 2018-03-23 DIAGNOSIS — R928 Other abnormal and inconclusive findings on diagnostic imaging of breast: Secondary | ICD-10-CM

## 2018-03-23 MED ORDER — FUROSEMIDE 20 MG PO TABS: 20.0000 mg | ORAL_TABLET | Freq: Every day | ORAL | 12 refills | Status: DC

## 2018-03-23 NOTE — Progress Notes (Signed)
   Subjective:    Patient ID: Amber Rose, female    DOB: 13-Feb-1976, 42 y.o.   MRN: [id number]  HPI 42 yo single P3 (27, 75, and 72 yo kids) here today with the unusual symptoms of face swelling, neck pain, "a breakdown of my entire functions, everything goes haywire". This occurs 5 days prior to her period and lasts for the first 4 days of her 8 day period. She has seen allergists, GI and had a negative CT, MRIs. She has tried IBU. She also has uterine cramps with her period, "shuts her down". She is extremely tired. Periods are very heavy.   Review of Systems She had a BTL Abstinent for about 12/18 She takes valtrex for HSV.    Objective:   Physical Exam  She showed me a picture from this morning and facial puffiness is noted.  Her swelling is less now during the exam Abd-benign     Assessment & Plan:  Painful periods, swelling associated with periods- I think that OCPs with drosperidone would help her, however she is 42 years old and smokes.  So, my next option would be a mild diuretic Lasix 20 mg prescribed She will email me through MyChart to let me know if it has worked by next week.

## 2018-03-23 NOTE — Progress Notes (Signed)
Pt declines Integrated Special educational needs teacher.  Pt reports that she is currently seeing someone already.

## 2018-03-24 LAB — CBC
Hematocrit: 37.1 % (ref 34.0–46.6)
Hemoglobin: 12 g/dL (ref 11.1–15.9)
MCH: 27.1 pg (ref 26.6–33.0)
MCHC: 32.3 g/dL (ref 31.5–35.7)
MCV: 84 fL (ref 79–97)
Platelets: 349 10*3/uL (ref 150–450)
RBC: 4.43 x10E6/uL (ref 3.77–5.28)
RDW: 17.5 % — ABNORMAL HIGH (ref 12.3–15.4)
WBC: 10.4 10*3/uL (ref 3.4–10.8)

## 2018-03-25 ENCOUNTER — Ambulatory Visit: Payer: Medicaid Other | Admitting: Family Medicine

## 2018-03-25 ENCOUNTER — Encounter: Payer: Self-pay | Admitting: Endocrinology

## 2018-03-29 ENCOUNTER — Encounter: Payer: Self-pay | Admitting: Family Medicine

## 2018-03-30 ENCOUNTER — Encounter: Payer: Self-pay | Admitting: Endocrinology

## 2018-03-31 ENCOUNTER — Encounter: Payer: Self-pay | Admitting: Obstetrics & Gynecology

## 2018-03-31 ENCOUNTER — Encounter: Payer: Self-pay | Admitting: Physician Assistant

## 2018-03-31 MED FILL — ACYCLOVIR 800 MG TABLET: 800 | 30 days supply | Qty: 30 | Fill #1

## 2018-04-01 MED FILL — $BYDUERON 2MG PEN INJECT: 2 | 28 days supply | Qty: 4 | Fill #2

## 2018-04-04 ENCOUNTER — Encounter: Payer: Self-pay | Admitting: Obstetrics & Gynecology

## 2018-04-05 ENCOUNTER — Ambulatory Visit: Payer: Self-pay | Attending: Family Medicine | Admitting: Family Medicine

## 2018-04-05 ENCOUNTER — Encounter: Payer: Self-pay | Admitting: Family Medicine

## 2018-04-05 VITALS — BP 130/86 | HR 74 | Temp 97.6°F | Ht 63.0 in | Wt 195.4 lb

## 2018-04-05 DIAGNOSIS — R202 Paresthesia of skin: Secondary | ICD-10-CM

## 2018-04-05 DIAGNOSIS — F329 Major depressive disorder, single episode, unspecified: Secondary | ICD-10-CM | POA: Insufficient documentation

## 2018-04-05 DIAGNOSIS — F32A Anxiety disorder, unspecified: Secondary | ICD-10-CM

## 2018-04-05 DIAGNOSIS — K219 Gastro-esophageal reflux disease without esophagitis: Secondary | ICD-10-CM | POA: Insufficient documentation

## 2018-04-05 DIAGNOSIS — Z880 Allergy status to penicillin: Secondary | ICD-10-CM | POA: Insufficient documentation

## 2018-04-05 DIAGNOSIS — M79652 Pain in left thigh: Secondary | ICD-10-CM | POA: Insufficient documentation

## 2018-04-05 DIAGNOSIS — B078 Other viral warts: Secondary | ICD-10-CM | POA: Insufficient documentation

## 2018-04-05 DIAGNOSIS — N3281 Overactive bladder: Secondary | ICD-10-CM | POA: Insufficient documentation

## 2018-04-05 DIAGNOSIS — Z794 Long term (current) use of insulin: Secondary | ICD-10-CM | POA: Insufficient documentation

## 2018-04-05 DIAGNOSIS — E119 Type 2 diabetes mellitus without complications: Secondary | ICD-10-CM

## 2018-04-05 DIAGNOSIS — E1149 Type 2 diabetes mellitus with other diabetic neurological complication: Secondary | ICD-10-CM

## 2018-04-05 DIAGNOSIS — E785 Hyperlipidemia, unspecified: Secondary | ICD-10-CM | POA: Insufficient documentation

## 2018-04-05 DIAGNOSIS — E1140 Type 2 diabetes mellitus with diabetic neuropathy, unspecified: Secondary | ICD-10-CM | POA: Insufficient documentation

## 2018-04-05 DIAGNOSIS — N898 Other specified noninflammatory disorders of vagina: Secondary | ICD-10-CM | POA: Insufficient documentation

## 2018-04-05 DIAGNOSIS — M797 Fibromyalgia: Secondary | ICD-10-CM | POA: Insufficient documentation

## 2018-04-05 DIAGNOSIS — F419 Anxiety disorder, unspecified: Secondary | ICD-10-CM | POA: Insufficient documentation

## 2018-04-05 DIAGNOSIS — M79651 Pain in right thigh: Secondary | ICD-10-CM | POA: Insufficient documentation

## 2018-04-05 DIAGNOSIS — Z79899 Other long term (current) drug therapy: Secondary | ICD-10-CM | POA: Insufficient documentation

## 2018-04-05 DIAGNOSIS — R062 Wheezing: Secondary | ICD-10-CM | POA: Insufficient documentation

## 2018-04-05 DIAGNOSIS — I10 Essential (primary) hypertension: Secondary | ICD-10-CM | POA: Insufficient documentation

## 2018-04-05 DIAGNOSIS — Z8619 Personal history of other infectious and parasitic diseases: Secondary | ICD-10-CM | POA: Insufficient documentation

## 2018-04-05 LAB — POCT URINALYSIS DIPSTICK
Bilirubin, UA: NEGATIVE
Glucose, UA: NEGATIVE
Ketones, UA: NEGATIVE
Leukocytes, UA: NEGATIVE
Nitrite, UA: NEGATIVE
Protein, UA: NEGATIVE
Spec Grav, UA: 1.025 (ref 1.010–1.025)
Urobilinogen, UA: 0.2 E.U./dL
pH, UA: 6 (ref 5.0–8.0)

## 2018-04-05 LAB — POCT GLYCOSYLATED HEMOGLOBIN (HGB A1C): HbA1c, POC (controlled diabetic range): 6.2 % (ref 0.0–7.0)

## 2018-04-05 LAB — GLUCOSE, POCT (MANUAL RESULT ENTRY): POC Glucose: 180 mg/dl — AB (ref 70–99)

## 2018-04-05 MED ORDER — HYDROXYZINE HCL 25 MG PO TABS: 25.0000 mg | ORAL_TABLET | Freq: Three times a day (TID) | ORAL | 0 refills | Status: DC | PRN

## 2018-04-05 MED ORDER — MELOXICAM 7.5 MG PO TABS: 7.5000 mg | ORAL_TABLET | Freq: Every day | ORAL | 1 refills | Status: DC

## 2018-04-05 MED ORDER — ACYCLOVIR 400 MG PO TABS: 400.0000 mg | ORAL_TABLET | Freq: Two times a day (BID) | ORAL | 0 refills | Status: DC

## 2018-04-05 MED ORDER — EXENATIDE ER 2 MG ~~LOC~~ PEN: PEN_INJECTOR | SUBCUTANEOUS | 0 refills | Status: DC

## 2018-04-05 MED ORDER — CARVEDILOL 6.25 MG PO TABS: 6.2500 mg | ORAL_TABLET | Freq: Two times a day (BID) | ORAL | 0 refills | Status: DC

## 2018-04-05 MED ORDER — AMLODIPINE BESYLATE 10 MG PO TABS: 10.0000 mg | ORAL_TABLET | Freq: Every day | ORAL | 0 refills | Status: DC

## 2018-04-05 MED ORDER — ALBUTEROL SULFATE HFA 108 (90 BASE) MCG/ACT IN AERS
2.0000 | INHALATION_SPRAY | RESPIRATORY_TRACT | 0 refills | Status: AC | PRN
Start: 2018-04-05 — End: ?

## 2018-04-05 MED ORDER — METHOCARBAMOL 750 MG PO TABS: 750.0000 mg | ORAL_TABLET | Freq: Three times a day (TID) | ORAL | 0 refills | Status: DC | PRN

## 2018-04-05 MED FILL — !VENTOLIN HFA INHALER: 108 (90 BAS | 16 days supply | Qty: 18 | Fill #0 | Status: TO

## 2018-04-05 MED FILL — METHOCARBAMOL 750 MG TABS: 750 | 30 days supply | Qty: 90 | Fill #0 | Status: TO

## 2018-04-05 MED FILL — MELOXICAM 7.5 MG TABLET: 7.5 | 30 days supply | Qty: 30 | Fill #0 | Status: TO

## 2018-04-05 MED FILL — ?CARVEDILOL 6.25 MG TABLET: 6.25 | 30 days supply | Qty: 60 | Fill #0 | Status: TO

## 2018-04-05 MED FILL — hydrOXYzine HCL 25 MG TABS: 25 | 30 days supply | Qty: 90 | Fill #0

## 2018-04-05 NOTE — Patient Instructions (Signed)
Warts Warts are small growths on the skin. They are common, and they are caused by a type of germ (virus). Warts can occur on many areas of the body. A person may have one wart or more than one wart. Warts can spread if you scratch a wart and then scratch normal skin. Most warts will go away over many months to a couple years. Treatments may be done if needed. Follow these instructions at home:  Apply over-the-counter and prescription medicines only as told by your doctor.  Do not apply over-the-counter wart medicines to your face or genitals before you ask your doctor if it is okay to do that.  Do not scratch or pick at a wart.  Wash your hands after you touch a wart.  Avoid shaving hair that is over a wart.  Keep all follow-up visits as told by your doctor. This is important. Contact a doctor if:  Your warts do not improve after treatment.  You have redness, swelling, or pain at the site of a wart.  You have bleeding from a wart, and the bleeding does not stop when you put light pressure on the wart.  You have diabetes and you get a wart. This information is not intended to replace advice given to you by your health care provider. Make sure you discuss any questions you have with your health care provider. Document Released: 02/12/2011 Document Revised: 03/19/2016 Document Reviewed: 01/07/2015 Elsevier Interactive Patient Education  2018 [employer]  

## 2018-04-05 NOTE — Progress Notes (Signed)
Subjective:  Patient ID: Amber Rose, female    DOB: 05-Mar-1976  Age: 42 y.o. MRN: [id number]  CC: Diabetes and Hypertension   HPI Amber Rose is a 42 year old female with type 2 diabetes mellitus (A1c 6.2), hypertension, depression and anxiety, hydradenitis, tobacco abuse, fibromyalgia here for follow-up visit. Her A1c is 6.2 which has improved from 6.5 previously and she has been compliant with her medications.  She does have diabetic neuropathy but was unable to tolerate gabapentin in the past. She complains of a sensation in her mid back which radiates up her back towards her neck and feels like paresthesia but denies pain.  She was on Cymbalta in the past but then could not tolerate it. Her anxiety and depression have been stable as she has been seeing a therapist and only takes hydroxyzine; her med list reveals she should be on trazodone and Zoloft however she no longer takes them. She is relocating to [place] and would like refills of her medications and plans to establish care with a new PCP and psychiatrist when she gets there. She has also been on oxybutynin which she discontinued as it was not helping her overactive bladder symptoms. She complains of a wart on her left index finger to which Aldara cream has been prescribed in the past with no relief.  She denies the presence of pain.  Past Medical History:  Diagnosis Date  . Allergy   . Anxiety   . Arthritis   . Chronic UTI   . Depression   . Diabetes mellitus without complication ([place])   . Diverticulosis   . Fibromyalgia   . GERD (gastroesophageal reflux disease)   . Hiatal hernia   . Hyperlipidemia   . Hypertension   . Internal hemorrhoids   . Meningitis, viral   . Pancreatitis     Past Surgical History:  Procedure Laterality Date  . APPENDECTOMY  1990  . ceasarian  2002  . CESAREAN SECTION     x1  . CHOLECYSTECTOMY  2011  . COLONOSCOPY    . UPPER GASTROINTESTINAL ENDOSCOPY    . URINARY SURGERY      urethra sling, removal, and then revision 2016 (4 surgeries)  . WISDOM TOOTH EXTRACTION      Allergies  Allergen Reactions  . Metformin And Related Nausea And Vomiting  . Penicillins     childhood  . Xanax [Alprazolam]     "Caused upper respiratory symptoms" per pt  . Glyburide Other (See Comments)    "blood sugar dropped uncontrollably"  . Linagliptin Diarrhea and Other (See Comments)    Stomach pain, sinus infection  . Moxifloxacin Diarrhea     Outpatient Medications Prior to Visit  Medication Sig Dispense Refill  . Blood Glucose Monitoring Suppl (ACCU-CHEK AVIVA PLUS) w/Device KIT USE AS DIRECTED DAILY. E11.9 1 kit 0  . ibuprofen (ADVIL,MOTRIN) 600 MG tablet Take 600 mg by mouth every 6 (six) hours as needed.    . insulin aspart (NOVOLOG FLEXPEN) 100 UNIT/ML FlexPen 0-12 units subcutaneously 3 times daily before meals, as needed 15 mL 3  . Insulin Pen Needle 31G X 5 MM MISC 1 each by Does not apply route 3 (three) times daily before meals. 90 each 1  . Lancet Devices (ACCU-CHEK SOFTCLIX) lancets Use as instructed daily. 1 each 5  . metoCLOPramide (REGLAN) 5 MG tablet Take 1 tablet (5 mg total) by mouth 4 (four) times daily -  before meals and at bedtime. 120 tablet 3  . omeprazole (PRILOSEC) 40  MG capsule Take 1 tab every morning. 90 capsule 3  . podophyllum resin (PODOCON-25) 25 % SOLN Apply 1 mL topically once a week. To wart on L index finger 15 mL 0  . acyclovir (ZOVIRAX) 400 MG tablet TAKE 1 TABLET BY MOUTH TWICE A DAY 60 tablet 2  . albuterol (PROAIR HFA) 108 (90 Base) MCG/ACT inhaler Inhale 2 puffs into the lungs every 4 (four) hours as needed for wheezing or shortness of breath. 1 Inhaler 1  . amLODipine (NORVASC) 10 MG tablet Take 1 tablet (10 mg total) by mouth daily. 90 tablet 3  . carvedilol (COREG) 6.25 MG tablet Take 1 tablet (6.25 mg total) by mouth 2 (two) times daily with a meal. 60 tablet 3  . Exenatide ER (BYDUREON) 2 MG PEN INJECT 2 MG INTO THE SKIN ONCE A  WEEK. 12 each 3  . hydrOXYzine (ATARAX/VISTARIL) 25 MG tablet Take 1 tablet (25 mg total) by mouth 3 (three) times daily as needed. 30 tablet 0  . meloxicam (MOBIC) 7.5 MG tablet Take 1 tablet (7.5 mg total) by mouth daily. 30 tablet 1  . methocarbamol (ROBAXIN) 750 MG tablet Take 1 tablet (750 mg total) by mouth every 8 (eight) hours as needed for muscle spasms. 60 tablet 2  . furosemide (LASIX) 20 MG tablet Take 1 tablet (20 mg total) by mouth daily. (Patient not taking: Reported on 04/05/2018) 30 tablet 12  . sertraline (ZOLOFT) 50 MG tablet Take 1 tablet (50 mg total) by mouth daily. (Patient not taking: Reported on 04/05/2018) 30 tablet 3  . traZODone (DESYREL) 100 MG tablet Take 1 tablet (100 mg total) by mouth at bedtime as needed for sleep. (Patient not taking: Reported on 04/05/2018) 30 tablet 3  . oxybutynin (DITROPAN) 5 MG tablet Take 1 tablet (5 mg total) by mouth every 8 (eight) hours as needed for bladder spasms. (Patient not taking: Reported on 04/05/2018) 60 tablet 3   Facility-Administered Medications Prior to Visit  Medication Dose Route Frequency Provider Last Rate Last Dose  . 0.9 %  sodium chloride infusion  500 mL Intravenous Once Pyrtle, Lajuan Lines, MD      . omalizumab Arvid Right) injection 300 mg  300 mg Subcutaneous Q28 days Valentina Shaggy, MD   300 mg at 01/03/18 1210    ROS Review of Systems  Constitutional: Negative for activity change, appetite change and fatigue.  HENT: Negative for congestion, sinus pressure and sore throat.   Eyes: Negative for visual disturbance.  Respiratory: Negative for cough, chest tightness, shortness of breath and wheezing.   Cardiovascular: Negative for chest pain and palpitations.  Gastrointestinal: Negative for abdominal distention, abdominal pain and constipation.  Endocrine: Negative for polydipsia.  Genitourinary: Negative for dysuria and frequency.  Musculoskeletal: Negative for arthralgias and back pain.  Skin: Positive for rash.    Neurological: Positive for numbness. Negative for tremors and light-headedness.  Hematological: Does not bruise/bleed easily.  Psychiatric/Behavioral: Negative for agitation and behavioral problems.    Objective:  BP 130/86   Pulse 74   Temp 97.6 F (36.4 C) (Oral)   Ht '5\' 3"'  (1.6 m)   Wt 195 lb 6.4 oz (88.6 kg)   SpO2 100%   BMI 34.61 kg/m   BP/Weight 04/05/2018 6/76/7209 01/30/961  Systolic BP 836 629 476  Diastolic BP 86 86 80  Wt. (Lbs) 195.4 194.6 190.5  BMI 34.61 33.93 33.22      Physical Exam  Constitutional: She is oriented to person, place, and time. She  appears well-developed and well-nourished.  Cardiovascular: Normal rate, normal heart sounds and intact distal pulses.  No murmur heard. Pulmonary/Chest: Effort normal and breath sounds normal. She has no wheezes. She has no rales. She exhibits no tenderness.  Abdominal: Soft. Bowel sounds are normal. She exhibits no distension and no mass. There is no tenderness.  Musculoskeletal: Normal range of motion.  Neurological: She is alert and oriented to person, place, and time.  Skin:  Wart on left index finger  Psychiatric: She has a normal mood and affect.     Lab Results  Component Value Date   HGBA1C 6.2 04/05/2018    Assessment & Plan:   1.Type 2 diabetes mellitus with other neurologic complication, without long-term current use of insulin ([facility]) Controlled with A1c of 6.2 Unable to tolerate gabapentin - Exenatide ER (BYDUREON) 2 MG PEN; INJECT 2 MG INTO THE SKIN ONCE A WEEK.  Dispense: 12 each; Refill: 0 Continue current regimen and diabetic diet - POCT glucose (manual entry) - POCT glycosylated hemoglobin (Hb A1C)  2. Pain in both thighs Stable - methocarbamol (ROBAXIN) 750 MG tablet; Take 1 tablet (750 mg total) by mouth every 8 (eight) hours as needed for muscle spasms.  Dispense: 180 tablet; Refill: 0  3. Anxiety and depression Previously taking Zoloft and trazodone which she discontinued Also  seeing a therapist Will be seeking any psychiatrist once she gets to [place] - meloxicam (MOBIC) 7.5 MG tablet; Take 1 tablet (7.5 mg total) by mouth daily.  Dispense: 30 tablet; Refill: 1 - hydrOXYzine (ATARAX/VISTARIL) 25 MG tablet; Take 1 tablet (25 mg total) by mouth 3 (three) times daily as needed.  Dispense: 90 tablet; Refill: 0  4.  History of herpes - acyclovir (ZOVIRAX) 400 MG tablet; Take 1 tablet (400 mg total) by mouth 2 (two) times daily.  Dispense: 180 tablet; Refill: 0  5. Hypertension, unspecified type Controlled - amLODipine (NORVASC) 10 MG tablet; Take 1 tablet (10 mg total) by mouth daily.  Dispense: 90 tablet; Refill: 0  6. Wheezing Stable - albuterol (PROAIR HFA) 108 (90 Base) MCG/ACT inhaler; Inhale 2 puffs into the lungs every 4 (four) hours as needed for wheezing or shortness of breath.  Dispense: 3 Inhaler; Refill: 0  7. Vaginal discharge - Urine cytology ancillary only  8. Other viral warts Previously tried Aldara with no relief Will need dermatology evaluation however she will be moving to [place] and will continue follow-up there  9. Paresthesia Unable to tolerate gabapentin  10. Overactive bladder She has been on oxybutynin with no improvement in symptoms - POCT urinalysis dipstick  11. Fibromyalgia This could explain her multiple somatic complaints Advised that physical therapy, aquatic therapy and massage therapy will be beneficial Continue muscle relaxant Unable to tolerate gabapentin and Cymbalta  Meds ordered this encounter  Medications  . methocarbamol (ROBAXIN) 750 MG tablet    Sig: Take 1 tablet (750 mg total) by mouth every 8 (eight) hours as needed for muscle spasms.    Dispense:  180 tablet    Refill:  0  . meloxicam (MOBIC) 7.5 MG tablet    Sig: Take 1 tablet (7.5 mg total) by mouth daily.    Dispense:  30 tablet    Refill:  1  . hydrOXYzine (ATARAX/VISTARIL) 25 MG tablet    Sig: Take 1 tablet (25 mg total) by mouth 3 (three)  times daily as needed.    Dispense:  90 tablet    Refill:  0  . Exenatide ER (BYDUREON) 2  MG PEN    Sig: INJECT 2 MG INTO THE SKIN ONCE A WEEK.    Dispense:  12 each    Refill:  0  . carvedilol (COREG) 6.25 MG tablet    Sig: Take 1 tablet (6.25 mg total) by mouth 2 (two) times daily with a meal.    Dispense:  180 tablet    Refill:  0  . amLODipine (NORVASC) 10 MG tablet    Sig: Take 1 tablet (10 mg total) by mouth daily.    Dispense:  90 tablet    Refill:  0  . albuterol (PROAIR HFA) 108 (90 Base) MCG/ACT inhaler    Sig: Inhale 2 puffs into the lungs every 4 (four) hours as needed for wheezing or shortness of breath.    Dispense:  3 Inhaler    Refill:  0  . acyclovir (ZOVIRAX) 400 MG tablet    Sig: Take 1 tablet (400 mg total) by mouth 2 (two) times daily.    Dispense:  180 tablet    Refill:  0    DX Code Needed  .Z86.19    Follow-up: Return for Follow-up of chronic medical conditions, she will be establishing care with a PCP in [place].   Charlott Rakes MD

## 2018-04-06 ENCOUNTER — Encounter: Payer: Self-pay | Admitting: Family Medicine

## 2018-04-07 LAB — URINE CYTOLOGY ANCILLARY ONLY
Chlamydia: NEGATIVE
Neisseria Gonorrhea: NEGATIVE
Trichomonas: NEGATIVE

## 2018-04-08 ENCOUNTER — Other Ambulatory Visit: Payer: Self-pay | Admitting: Obstetrics & Gynecology

## 2018-04-08 DIAGNOSIS — R928 Other abnormal and inconclusive findings on diagnostic imaging of breast: Secondary | ICD-10-CM

## 2018-04-08 LAB — URINE CYTOLOGY ANCILLARY ONLY
Bacterial vaginitis: NEGATIVE
Candida vaginitis: NEGATIVE

## 2018-04-14 ENCOUNTER — Encounter: Payer: Medicaid Other | Admitting: Obstetrics and Gynecology

## 2018-04-14 ENCOUNTER — Other Ambulatory Visit: Payer: Self-pay

## 2018-04-14 DIAGNOSIS — T783XXS Angioneurotic edema, sequela: Secondary | ICD-10-CM

## 2018-04-18 ENCOUNTER — Telehealth: Payer: Self-pay | Admitting: Family Medicine

## 2018-04-18 NOTE — Telephone Encounter (Signed)
9 page paperwork received through fax 04-18-18.

## 2018-04-19 ENCOUNTER — Telehealth: Payer: Self-pay | Admitting: Internal Medicine

## 2018-04-19 MED ORDER — EXENATIDE 5 MCG/0.02ML ~~LOC~~ SOPN: 5.0000 ug | PEN_INJECTOR | Freq: Two times a day (BID) | SUBCUTANEOUS | 3 refills | Status: DC

## 2018-04-19 NOTE — Telephone Encounter (Signed)
-----   Message from Frederich Cha, CPhT sent at 04/19/2018  2:12 PM EDT ----- Regarding: PA Denial PA for Bydureon was denied, the insurance prefers Byetta for GLP-1's

## 2018-04-20 NOTE — Telephone Encounter (Signed)
Patient returned nurse call. Please f/u.  

## 2018-04-20 NOTE — Telephone Encounter (Signed)
Patient was called and informed to contact office for medication advice.

## 2018-04-20 NOTE — Telephone Encounter (Signed)
Patient has re-established care with another PCP.

## 2018-04-27 MED ORDER — EXENATIDE 5 MCG/0.02ML ~~LOC~~ SOPN: 5.0000 ug | PEN_INJECTOR | Freq: Two times a day (BID) | SUBCUTANEOUS | 3 refills | Status: DC

## 2018-05-23 ENCOUNTER — Ambulatory Visit: Payer: Medicaid Other | Admitting: Endocrinology

## 2018-05-27 ENCOUNTER — Encounter: Payer: Self-pay | Admitting: Internal Medicine

## 2019-01-29 ENCOUNTER — Other Ambulatory Visit: Payer: Self-pay | Admitting: Family Medicine

## 2019-01-29 DIAGNOSIS — I10 Essential (primary) hypertension: Secondary | ICD-10-CM

## 2019-03-14 ENCOUNTER — Telehealth: Payer: Self-pay | Admitting: *Deleted

## 2019-03-14 NOTE — Telephone Encounter (Signed)
Denied refill for ProAir. Patient needs office visit.

## 2019-06-01 ENCOUNTER — Other Ambulatory Visit (HOSPITAL_COMMUNITY)
Admission: RE | Admit: 2019-06-01 | Discharge: 2019-06-01 | Disposition: A | Discharge status: Home / Self Care | Payer: Medicare HMO | Source: Ambulatory Visit | Attending: Family Medicine | Admitting: Family Medicine

## 2019-06-01 ENCOUNTER — Other Ambulatory Visit: Payer: Self-pay

## 2019-06-01 ENCOUNTER — Ambulatory Visit (HOSPITAL_BASED_OUTPATIENT_CLINIC_OR_DEPARTMENT_OTHER): Payer: Medicare HMO | Admitting: Physician Assistant

## 2019-06-01 VITALS — BP 124/81 | HR 78 | Temp 98.0°F | Wt 190.6 lb

## 2019-06-01 DIAGNOSIS — R399 Unspecified symptoms and signs involving the genitourinary system: Secondary | ICD-10-CM | POA: Diagnosis present

## 2019-06-01 DIAGNOSIS — R300 Dysuria: Secondary | ICD-10-CM

## 2019-06-01 DIAGNOSIS — N898 Other specified noninflammatory disorders of vagina: Secondary | ICD-10-CM | POA: Diagnosis not present

## 2019-06-01 DIAGNOSIS — E1149 Type 2 diabetes mellitus with other diabetic neurological complication: Secondary | ICD-10-CM

## 2019-06-01 LAB — POCT URINALYSIS DIP (CLINITEK)
Bilirubin, UA: NEGATIVE
Glucose, UA: NEGATIVE mg/dL
Ketones, POC UA: NEGATIVE mg/dL
Nitrite, UA: NEGATIVE
Spec Grav, UA: 1.025 (ref 1.010–1.025)
Urobilinogen, UA: 0.2 E.U./dL
pH, UA: 5 (ref 5.0–8.0)

## 2019-06-01 LAB — POCT GLYCOSYLATED HEMOGLOBIN (HGB A1C): HbA1c, POC (controlled diabetic range): 6.6 % (ref 0.0–7.0)

## 2019-06-01 LAB — GLUCOSE, POCT (MANUAL RESULT ENTRY): POC Glucose: 126 mg/dl — AB (ref 70–99)

## 2019-06-01 MED ORDER — METFORMIN HCL 500 MG PO TABS: 1000.0000 mg | ORAL_TABLET | Freq: Two times a day (BID) | ORAL | 1 refills | Status: DC

## 2019-06-01 NOTE — Progress Notes (Signed)
Patient ID: Amber Rose, female   DOB: Jan 26, 1976, 43 y.o.   MRN: [id number]   Amber Rose, is a 43 y.o. female  YBO:[id number]  IDP:[id number]  DOB - 07/14/1976  Subjective:  Chief Complaint and HPI: Amber Rose is a 43 y.o. female here today for vaginal odor/discharge-thinks she has BV.  She has been travelling back and forth bt here and [place].  She will be back in [place] next week.  No  Pelvic pain.  No fever.  No abdominal pain.  +occasional dysuria.  Denies STI risk factors.   Taking metformin 1023m bid.  Struggling with following diabetic diet.    ROS:   Constitutional:  No f/c, No night sweats, No unexplained weight loss. EENT:  No vision changes, No blurry vision, No hearing changes. No mouth, throat, or ear problems.  Respiratory: No cough, No SOB Cardiac: No CP, no palpitations GI:  No abd pain, No N/V/D. GU:see above Musculoskeletal: No joint pain Neuro: No headache, no dizziness, no motor weakness.  Skin: No rash Endocrine:  No polydipsia. No polyuria.  Psych: Denies SI/HI  No problems updated.  ALLERGIES: Allergies  Allergen Reactions  . Metformin And Related Nausea And Vomiting  . Penicillins     childhood  . Xanax [Alprazolam]     "Caused upper respiratory symptoms" per pt  . Glyburide Other (See Comments)    "blood sugar dropped uncontrollably"  . Linagliptin Diarrhea and Other (See Comments)    Stomach pain, sinus infection  . Moxifloxacin Diarrhea    PAST MEDICAL HISTORY: Past Medical History:  Diagnosis Date  . Allergy   . Anxiety   . Arthritis   . Chronic UTI   . Depression   . Diabetes mellitus without complication ([place]   . Diverticulosis   . Fibromyalgia   . GERD (gastroesophageal reflux disease)   . Hiatal hernia   . Hyperlipidemia   . Hypertension   . Internal hemorrhoids   . Meningitis, viral   . Pancreatitis     MEDICATIONS AT HOME: Prior to Admission medications   Medication Sig Start Date End Date  Taking? Authorizing Provider  hydrOXYzine (ATARAX/VISTARIL) 25 MG tablet Take 1 tablet (25 mg total) by mouth 3 (three) times daily as needed. 04/05/18  Yes NCharlott Rakes MD  ibuprofen (ADVIL,MOTRIN) 600 MG tablet Take 600 mg by mouth every 6 (six) hours as needed.   Yes [provider]  metoCLOPramide (REGLAN) 5 MG tablet Take 1 tablet (5 mg total) by mouth 4 (four) times daily -  before meals and at bedtime. 03/03/18  Yes Pyrtle, JLajuan Lines MD  omeprazole (PRILOSEC) 40 MG capsule Take 1 tab every morning. 12/13/17  Yes Esterwood, Amy S, PA-C  acyclovir (ZOVIRAX) 400 MG tablet Take 1 tablet (400 mg total) by mouth 2 (two) times daily. Patient not taking: Reported on 06/01/2019 04/05/18   NCharlott Rakes MD  albuterol (PROAIR HFA) 108 (90 Base) MCG/ACT inhaler Inhale 2 puffs into the lungs every 4 (four) hours as needed for wheezing or shortness of breath. Patient not taking: Reported on 06/01/2019 04/05/18   NCharlott Rakes MD  amLODipine (NORVASC) 10 MG tablet Take 1 tablet (10 mg total) by mouth daily. Patient not taking: Reported on 06/01/2019 04/05/18   NCharlott Rakes MD  Blood Glucose Monitoring Suppl (ACCU-CHEK AVIVA PLUS) w/Device KIT USE AS DIRECTED DAILY. E11.9 Patient not taking: Reported on 06/01/2019 09/13/17   NCharlott Rakes MD  carvedilol (COREG) 6.25 MG tablet Take 1 tablet (6.25 mg total) by mouth  2 (two) times daily with a meal. Patient not taking: Reported on 06/01/2019 04/05/18   Charlott Rakes, MD  exenatide (BYETTA 5 MCG PEN) 5 MCG/0.02ML SOPN injection Inject 0.02 mLs (5 mcg total) into the skin 2 (two) times daily with a meal. Patient not taking: Reported on 06/01/2019 04/27/18   Ladell Pier, MD  furosemide (LASIX) 20 MG tablet Take 1 tablet (20 mg total) by mouth daily. Patient not taking: Reported on 04/05/2018 03/23/18   Emily Filbert, MD  Insulin Pen Needle 31G X 5 MM MISC 1 each by Does not apply route 3 (three) times daily before meals. Patient not taking: Reported on  06/01/2019 09/15/17   Charlott Rakes, MD  Lancet Devices [facility]) lancets Use as instructed daily. Patient not taking: Reported on 06/01/2019 05/14/17   Charlott Rakes, MD  meloxicam (MOBIC) 7.5 MG tablet Take 1 tablet (7.5 mg total) by mouth daily. Patient not taking: Reported on 06/01/2019 04/05/18   Charlott Rakes, MD  metFORMIN (GLUCOPHAGE) 500 MG tablet Take 2 tablets (1,000 mg total) by mouth 2 (two) times daily with a meal. 06/01/19   Lianette Broussard, Dionne Bucy, PA-C  methocarbamol (ROBAXIN) 750 MG tablet Take 1 tablet (750 mg total) by mouth every 8 (eight) hours as needed for muscle spasms. Patient not taking: Reported on 06/01/2019 04/05/18   Charlott Rakes, MD  podophyllum resin (PODOCON-25) 25 % SOLN Apply 1 mL topically once a week. To wart on L index finger Patient not taking: Reported on 06/01/2019 05/14/17   Charlott Rakes, MD  sertraline (ZOLOFT) 50 MG tablet Take 1 tablet (50 mg total) by mouth daily. Patient not taking: Reported on 04/05/2018 12/28/17   Charlott Rakes, MD  traZODone (DESYREL) 100 MG tablet Take 1 tablet (100 mg total) by mouth at bedtime as needed for sleep. Patient not taking: Reported on 04/05/2018 02/02/17   Charlott Rakes, MD     Objective:  EXAM:   Vitals:   06/01/19 1447  BP: 124/81  Pulse: 78  Temp: 98 F (36.7 C)  TempSrc: Oral  SpO2: 100%  Weight: 190 lb 9.6 oz (86.5 kg)    General appearance : A&OX3. NAD. Non-toxic-appearing HEENT: Atraumatic and Normocephalic.  PERRLA. EOM intact.   Chest/Lungs:  Breathing-non-labored, Good air entry bilaterally, breath sounds normal without rales, rhonchi, or wheezing  CVS: S1 S2 regular, no murmurs, gallops, rubs  GU-external genitalia-WNL-speculum inserted.  Vaginal mucosa appears healthy.  Small amount of white thin d/c in vault.  Cervical swabs taken.  Bimanual unremarkable Psych:  TP linear. J/I WNL. Normal speech. Appropriate eye contact and affect.  Skin:  No Rash  Data Review Lab Results  Component  Value Date   HGBA1C 6.6 06/01/2019   HGBA1C 6.2 04/05/2018   HGBA1C 6.5 12/28/2017     Assessment & Plan   1. UTI symptoms Drink 80-100 ounces water daily - POCT glucose (manual entry) - TSH - Cervicovaginal ancillary only - metFORMIN (GLUCOPHAGE) 500 MG tablet; Take 2 tablets (1,000 mg total) by mouth 2 (two) times daily with a meal.  Dispense: 360 tablet; Refill: 1  2. Type 2 diabetes mellitus with other neurologic complication, without long-term current use of insulin ([place]) Uncontrolled-she wants to work on diabetic diet.  Continue current medication regiemn.  - POCT glycosylated hemoglobin (Hb A1C) - POCT URINALYSIS DIP (CLINITEK) - Comprehensive metabolic panel - Lipid panel - TSH - Vitamin D, 25-hydroxy - CBC with Differential/Platelet I have had a lengthy discussion and provided education about insulin resistance  and the intake of too much sugar/refined carbohydrates.  I have advised the patient to work at a goal of eliminating sugary drinks, candy, desserts, sweets, refined sugars, processed foods, and white carbohydrates.  The patient expresses understanding.    Patient have been counseled extensively about nutrition and exercise  Return in about 3 months (around 09/01/2019) for Dr Margarita Rana chronic medical conditions.  The patient was given clear instructions to go to ER or return to medical center if symptoms don't improve, worsen or new problems develop. The patient verbalized understanding. The patient was told to call to get lab results if they haven't heard anything in the next week.     Freeman Caldron, PA-C [facility] [place], [place]   06/01/2019, 3:09 PM

## 2019-06-01 NOTE — Patient Instructions (Addendum)
Drink 80-100 ounces water daily.  If I need to send you meds next week, I will send them to [facility]  [place], [place].   Decrease sugar and white carbohydrate intake.  Exercise.

## 2019-06-02 ENCOUNTER — Telehealth: Payer: Self-pay | Admitting: *Deleted

## 2019-06-02 ENCOUNTER — Other Ambulatory Visit: Payer: Self-pay | Admitting: Physician Assistant

## 2019-06-02 DIAGNOSIS — E559 Vitamin D deficiency, unspecified: Secondary | ICD-10-CM

## 2019-06-02 DIAGNOSIS — E785 Hyperlipidemia, unspecified: Secondary | ICD-10-CM

## 2019-06-02 LAB — CBC WITH DIFFERENTIAL/PLATELET
Basophils Absolute: 0.1 10*3/uL (ref 0.0–0.2)
Basos: 0 %
EOS (ABSOLUTE): 0.1 10*3/uL (ref 0.0–0.4)
Eos: 1 %
Hematocrit: 37.8 % (ref 34.0–46.6)
Hemoglobin: 11.8 g/dL (ref 11.1–15.9)
Immature Grans (Abs): 0 10*3/uL (ref 0.0–0.1)
Immature Granulocytes: 0 %
Lymphocytes Absolute: 2.8 10*3/uL (ref 0.7–3.1)
Lymphs: 20 %
MCH: 25.8 pg — ABNORMAL LOW (ref 26.6–33.0)
MCHC: 31.2 g/dL — ABNORMAL LOW (ref 31.5–35.7)
MCV: 83 fL (ref 79–97)
Monocytes Absolute: 0.9 10*3/uL (ref 0.1–0.9)
Monocytes: 6 %
Neutrophils Absolute: 10 10*3/uL — ABNORMAL HIGH (ref 1.4–7.0)
Neutrophils: 73 %
Platelets: 413 10*3/uL (ref 150–450)
RBC: 4.57 x10E6/uL (ref 3.77–5.28)
RDW: 17.6 % — ABNORMAL HIGH (ref 11.7–15.4)
WBC: 13.8 10*3/uL — ABNORMAL HIGH (ref 3.4–10.8)

## 2019-06-02 LAB — COMPREHENSIVE METABOLIC PANEL
ALT: 10 IU/L (ref 0–32)
AST: 10 IU/L (ref 0–40)
Albumin/Globulin Ratio: 1.4 (ref 1.2–2.2)
Albumin: 4.1 g/dL (ref 3.8–4.8)
Alkaline Phosphatase: 106 IU/L (ref 39–117)
BUN/Creatinine Ratio: 14 (ref 9–23)
BUN: 10 mg/dL (ref 6–24)
Bilirubin Total: <0.2 mg/dL (ref 0.0–1.2)
CO2: 20 mmol/L (ref 20–29)
Calcium: 9.6 mg/dL (ref 8.7–10.2)
Chloride: 101 mmol/L (ref 96–106)
Creatinine, Ser: 0.69 mg/dL (ref 0.57–1.00)
GFR calc Af Amer: 123 mL/min/{1.73_m2} (ref >59)
GFR calc non Af Amer: 107 mL/min/{1.73_m2} (ref >59)
Globulin, Total: 2.9 g/dL (ref 1.5–4.5)
Glucose: 105 mg/dL — ABNORMAL HIGH (ref 65–99)
Potassium: 4.1 mmol/L (ref 3.5–5.2)
Sodium: 138 mmol/L (ref 134–144)
Total Protein: 7 g/dL (ref 6.0–8.5)

## 2019-06-02 LAB — VITAMIN D 25 HYDROXY (VIT D DEFICIENCY, FRACTURES): Vit D, 25-Hydroxy: 15.5 ng/mL — ABNORMAL LOW (ref 30.0–100.0)

## 2019-06-02 LAB — LIPID PANEL
Chol/HDL Ratio: 3.9 ratio (ref 0.0–4.4)
Cholesterol, Total: 188 mg/dL (ref 100–199)
HDL: 48 mg/dL (ref >39)
LDL Calculated: 88 mg/dL (ref 0–99)
Triglycerides: 259 mg/dL — ABNORMAL HIGH (ref 0–149)
VLDL Cholesterol Cal: 52 mg/dL — ABNORMAL HIGH (ref 5–40)

## 2019-06-02 LAB — TSH: TSH: 2.16 u[IU]/mL (ref 0.450–4.500)

## 2019-06-02 MED ORDER — VITAMIN D (ERGOCALCIFEROL) 1.25 MG (50000 UNIT) PO CAPS: 50000.0000 [IU] | ORAL_CAPSULE | ORAL | 0 refills | Status: DC

## 2019-06-02 MED ORDER — ATORVASTATIN CALCIUM 10 MG PO TABS: 10.0000 mg | ORAL_TABLET | Freq: Every day | ORAL | 3 refills | Status: DC

## 2019-06-02 MED FILL — VIT D2 1.25 MG (50,000 UNIT: 1.25 MG | 28 days supply | Qty: 4 | Fill #0

## 2019-06-02 MED FILL — ATORVASTATIN 10 MG TABLET: 10 | 90 days supply | Qty: 90 | Fill #0

## 2019-06-02 NOTE — Telephone Encounter (Signed)
Notes recorded by Carilyn Goodpasture, RN on 06/02/2019 at 9:01 AM EDT  Pt name and DOB verified. Patient aware of results and result note per Freeman Caldron, PA. Patient states she already on Atorvastatin 20 mg. Please advise.  ------   Notes recorded by Argentina Donovan, PA-C on 06/02/2019 at 7:53 AM EDT  Please call patient and let them that their vitamin D is low. This can contribute to muscle aches, anxiety, fatigue, and depression. I have sent a prescription to the pharmacy for them to take once a week. Her triglycerides are high and I am starting her on cholesterol medication. This is another reason to work hard on eliminating sugar bc abnormal lipids add more risk for diabetics. Vaginal swabs not back yet. Other labs are ok. Follow up as planned. Thanks, Freeman Caldron, PA-C

## 2019-06-02 NOTE — Telephone Encounter (Signed)
-----   Message from Argentina Donovan, [place] sent at 06/02/2019  7:53 AM EDT ----- Please call patient and let them that their vitamin D is low.  This can contribute to muscle aches, anxiety, fatigue, and depression.  I have sent a prescription to the pharmacy for them to take once a week.  Her triglycerides are high and I am starting her on cholesterol medication.  This is another reason to work hard on eliminating sugar bc abnormal lipids add more risk for diabetics. Vaginal swabs not back yet.  Other labs are ok.  Follow up as planned.  Thanks, Freeman Caldron, PA-C

## 2019-06-05 NOTE — Telephone Encounter (Signed)
Pt name and DOB verified. Patient aware to resume the atorvastatin per Dr. Wynetta Emery. Patient reminded of his appointment with Dr. Margarita Rana.

## 2019-06-08 ENCOUNTER — Other Ambulatory Visit: Payer: Self-pay | Admitting: Physician Assistant

## 2019-06-08 LAB — CERVICOVAGINAL ANCILLARY ONLY
Candida vaginitis: NEGATIVE
Chlamydia: NEGATIVE
Neisseria Gonorrhea: NEGATIVE
Trichomonas: NEGATIVE

## 2019-06-08 MED ORDER — METRONIDAZOLE 500 MG PO TABS: 500.0000 mg | ORAL_TABLET | Freq: Two times a day (BID) | ORAL | 0 refills | Status: DC

## 2019-07-24 ENCOUNTER — Ambulatory Visit: Payer: Medicare HMO | Attending: Family Medicine | Admitting: Family Medicine

## 2019-07-24 ENCOUNTER — Encounter: Payer: Self-pay | Admitting: Family Medicine

## 2019-07-24 ENCOUNTER — Other Ambulatory Visit: Payer: Self-pay

## 2019-07-24 VITALS — BP 123/79 | HR 76 | Temp 98.2°F | Ht 63.0 in | Wt 184.8 lb

## 2019-07-24 DIAGNOSIS — E1149 Type 2 diabetes mellitus with other diabetic neurological complication: Secondary | ICD-10-CM

## 2019-07-24 DIAGNOSIS — I10 Essential (primary) hypertension: Secondary | ICD-10-CM

## 2019-07-24 DIAGNOSIS — E1143 Type 2 diabetes mellitus with diabetic autonomic (poly)neuropathy: Secondary | ICD-10-CM

## 2019-07-24 DIAGNOSIS — E785 Hyperlipidemia, unspecified: Secondary | ICD-10-CM

## 2019-07-24 DIAGNOSIS — R197 Diarrhea, unspecified: Secondary | ICD-10-CM

## 2019-07-24 DIAGNOSIS — Z8619 Personal history of other infectious and parasitic diseases: Secondary | ICD-10-CM

## 2019-07-24 DIAGNOSIS — F419 Anxiety disorder, unspecified: Secondary | ICD-10-CM

## 2019-07-24 DIAGNOSIS — K3184 Gastroparesis: Secondary | ICD-10-CM

## 2019-07-24 DIAGNOSIS — Z1159 Encounter for screening for other viral diseases: Secondary | ICD-10-CM

## 2019-07-24 DIAGNOSIS — F32A Anxiety disorder, unspecified: Secondary | ICD-10-CM

## 2019-07-24 DIAGNOSIS — F329 Major depressive disorder, single episode, unspecified: Secondary | ICD-10-CM

## 2019-07-24 LAB — POCT GLYCOSYLATED HEMOGLOBIN (HGB A1C): HbA1c, POC (controlled diabetic range): 6.5 % (ref 0.0–7.0)

## 2019-07-24 LAB — GLUCOSE, POCT (MANUAL RESULT ENTRY): POC Glucose: 168 mg/dl — AB (ref 70–99)

## 2019-07-24 MED ORDER — HYDROXYZINE HCL 25 MG PO TABS: 25.0000 mg | ORAL_TABLET | Freq: Three times a day (TID) | ORAL | 1 refills | Status: DC | PRN

## 2019-07-24 MED ORDER — METOCLOPRAMIDE HCL 5 MG PO TABS
5.0000 mg | ORAL_TABLET | Freq: Three times a day (TID) | ORAL | 3 refills | Status: DC
Start: 2019-07-24 — End: 2019-12-05

## 2019-07-24 MED ORDER — AMLODIPINE BESYLATE 10 MG PO TABS: 10.0000 mg | ORAL_TABLET | Freq: Every day | ORAL | 0 refills | Status: DC

## 2019-07-24 MED ORDER — ACYCLOVIR 400 MG PO TABS: 400.0000 mg | ORAL_TABLET | Freq: Two times a day (BID) | ORAL | 0 refills | Status: DC

## 2019-07-24 MED ORDER — ATORVASTATIN CALCIUM 20 MG PO TABS: 20.0000 mg | ORAL_TABLET | Freq: Every day | ORAL | 1 refills | Status: DC

## 2019-07-24 MED ORDER — LISINOPRIL 5 MG PO TABS: 5.0000 mg | ORAL_TABLET | Freq: Every day | ORAL | 1 refills | Status: DC

## 2019-07-24 MED ORDER — METFORMIN HCL 500 MG PO TABS: 500.0000 mg | ORAL_TABLET | Freq: Two times a day (BID) | ORAL | 1 refills | Status: DC

## 2019-07-24 MED ORDER — BUPROPION HCL ER (SR) 150 MG PO TB12: 150.0000 mg | ORAL_TABLET | Freq: Two times a day (BID) | ORAL | 1 refills | Status: DC

## 2019-07-24 MED FILL — VIT D2 1.25 MG (50,000 UNIT: 1.25 MG | 28 days supply | Qty: 4 | Fill #1

## 2019-07-24 NOTE — Progress Notes (Signed)
Patient is having diarrhea she thinks its due to metformin.  Patient stopped metformin 3 days ago.

## 2019-07-24 NOTE — Patient Instructions (Signed)
Gastroparesis ° °Gastroparesis is a condition in which food takes longer than normal to empty from the stomach. The condition is usually long-lasting (chronic). It may also be called delayed gastric emptying. °There is no cure, but there are treatments and things that you can do at home to help relieve symptoms. Treating the underlying condition that causes gastroparesis can also help relieve symptoms. °What are the causes? °In many cases, the cause of this condition is not known. Possible causes include: °· A hormone (endocrine) disorder, such as hypothyroidism or diabetes. °· A nervous system disease, such as Parkinson's disease or multiple sclerosis. °· Cancer, infection, or surgery that affects the stomach or vagus nerve. The vagus nerve runs from your chest, through your neck, to the lower part of your brain. °· A connective tissue disorder, such as scleroderma. °· Certain medicines. °What increases the risk? °You are more likely to develop this condition if you: °· Have certain disorders or diseases, including: °? An endocrine disorder. °? An eating disorder. °? Amyloidosis. °? Scleroderma. °? Parkinson's disease. °? Multiple sclerosis. °? Cancer or infection of the stomach or the vagus nerve. °· Have had surgery on the stomach or vagus nerve. °· Take certain medicines. °· Are female. °What are the signs or symptoms? °Symptoms of this condition include: °· Feeling full after eating very little. °· Nausea. °· Vomiting. °· Heartburn. °· Abdominal bloating. °· Inconsistent blood sugar (glucose) levels on blood tests. °· Lack of appetite. °· Weight loss. °· Acid from the stomach coming up into the esophagus (gastroesophageal reflux). °· Sudden tightening (spasm) of the stomach, which can be painful. °Symptoms may come and go. Some people may not notice any symptoms. °How is this diagnosed? °This condition is diagnosed with tests, such as: °· Tests that check how long it takes food to move through the stomach and  intestines. These tests include: °? Upper gastrointestinal (GI) series. For this test, you drink a liquid that shows up well on X-rays, and then X-rays will be taken of your intestines. °? Gastric emptying scintigraphy. For this test, you eat food that contains a small amount of radioactive material, and then scans are taken. °? Wireless capsule GI monitoring system. For this test, you swallow a pill (capsule) that records information about how foods and fluid move through your stomach. °· Gastric manometry. For this test, a tube is passed down your throat and into your stomach to measure electrical and muscular activity. °· Endoscopy. For this test, a long, thin tube is passed down your throat and into your stomach to check for problems in your stomach lining. °· Ultrasound. This test uses sound waves to create images of inside the body. This can help rule out gallbladder disease or pancreatitis as a cause of your symptoms. °How is this treated? °There is no cure for gastroparesis. Treatment may include: °· Treating the underlying cause. °· Managing your symptoms by making changes to your diet and exercise habits. °· Taking medicines to control nausea and vomiting and to stimulate stomach muscles. °· Getting food through a feeding tube in the hospital. This may be done in severe cases. °· Having surgery to insert a device into your body that helps improve stomach emptying and control nausea and vomiting (gastric neurostimulator). °Follow these instructions at home: °· Take over-the-counter and prescription medicines only as told by your health care provider. °· Follow instructions from your health care provider about eating or drinking restrictions. Your health care provider may recommend that you: °? Eat   smaller meals more often. °? Eat low-fat foods. °? Eat low-fiber forms of high-fiber foods. For example, eat cooked vegetables instead of raw vegetables. °? Have only liquid foods instead of solid foods. Liquid  foods are easier to digest. °· Drink enough fluid to keep your urine pale yellow. °· Exercise as often as told by your health care provider. °· Keep all follow-up visits as told by your health care provider. This is important. °Contact a health care provider if you: °· Notice that your symptoms do not improve with treatment. °· Have new symptoms. °Get help right away if you: °· Have severe abdominal pain that does not improve with treatment. °· Have nausea that is severe or does not go away. °· Cannot drink fluids without vomiting. °Summary °· Gastroparesis is a chronic condition in which food takes longer than normal to empty from the stomach. °· Symptoms include nausea, vomiting, heartburn, abdominal bloating, and loss of appetite. °· Eating smaller portions, and low-fat, low-fiber foods may help you manage your symptoms. °· Get help right away if you have severe abdominal pain. °This information is not intended to replace advice given to you by your health care provider. Make sure you discuss any questions you have with your health care provider. °Document Released: 10/12/2005 Document Revised: 01/10/2018 Document Reviewed: 08/17/2017 °Elsevier Patient Education © 2020 [employer] ° °

## 2019-07-24 NOTE — Progress Notes (Signed)
Subjective:  Patient ID: Amber Rose, female    DOB: [birth year]  Age: 43 y.o. MRN: [id number]  CC: Abdominal Pain and Diabetes   HPI Amber Rose is a 43 year old female with type 2 diabetes mellitus (A1c 6.5), hypertension, depression and anxiety, hydradenitis, tobacco abuse, fibromyalgia here for follow-up visit. She had relocated to [place] last year but returned a month ago and is here to stay. Her Bydureon had been discontinued while in [place] and she was placed on metformin for management of diabetes but she complains of diarrhea with metformin and on discontinuing this for the last 3 days she became "stopped up". She complains of her abdomen stating "nothing is moving"; previously on Reglan for gastroparesis but states she was advised to only use this for a short while and then come off it.  Denies nausea, vomiting, reflux.  Prior to relocating to [facility] she was being followed by [facility]. For her anxiety and depression she is on Wellbutrin and is doing well. She had also been taken off lisinopril due to questionable angioedema however this was restarted after she relocated as she feels her facial swelling is hormone related.  Past Medical History:  Diagnosis Date   Allergy    Anxiety    Arthritis    Chronic UTI    Depression    Diabetes mellitus without complication (HCC)    Diverticulosis    Fibromyalgia    GERD (gastroesophageal reflux disease)    Hiatal hernia    Hyperlipidemia    Hypertension    Internal hemorrhoids    Meningitis, viral    Pancreatitis     Past Surgical History:  Procedure Laterality Date   APPENDECTOMY  1990   ceasarian  2002   CESAREAN SECTION     x1   CHOLECYSTECTOMY  2011   COLONOSCOPY     UPPER GASTROINTESTINAL ENDOSCOPY     URINARY SURGERY     urethra sling, removal, and then revision 2016 (4 surgeries)   WISDOM TOOTH EXTRACTION      Family History  Problem Relation Age of Onset   Cancer  Mother        cervical, breast, lungm skin   Diabetes Father    Multiple sclerosis Paternal Grandmother    Allergic rhinitis Neg Hx    Angioedema Neg Hx    Asthma Neg Hx    Eczema Neg Hx    Urticaria Neg Hx    Colon cancer Neg Hx    Esophageal cancer Neg Hx    Liver disease Neg Hx    Pancreatic cancer Neg Hx    Prostate cancer Neg Hx    Rectal cancer Neg Hx    Stomach cancer Neg Hx     Allergies  Allergen Reactions   Metformin And Related Nausea And Vomiting   Penicillins     childhood   Xanax [Alprazolam]     "Caused upper respiratory symptoms" per pt   Glyburide Other (See Comments)    "blood sugar dropped uncontrollably"   Linagliptin Diarrhea and Other (See Comments)    Stomach pain, sinus infection   Moxifloxacin Diarrhea    Outpatient Medications Prior to Visit  Medication Sig Dispense Refill   ibuprofen (ADVIL,MOTRIN) 600 MG tablet Take 600 mg by mouth every 6 (six) hours as needed.     omeprazole (PRILOSEC) 40 MG capsule Take 1 tab every morning. 90 capsule 3   Vitamin D, Ergocalciferol, (DRISDOL) 1.25 MG (50000 UT) CAPS capsule Take 1 capsule (50,000 Units  total) by mouth every 7 (seven) days. 16 capsule 0   acyclovir (ZOVIRAX) 400 MG tablet Take 1 tablet (400 mg total) by mouth 2 (two) times daily. 180 tablet 0   amLODipine (NORVASC) 10 MG tablet Take 1 tablet (10 mg total) by mouth daily. 90 tablet 0   atorvastatin (LIPITOR) 10 MG tablet Take 1 tablet (10 mg total) by mouth daily. 90 tablet 3   hydrOXYzine (ATARAX/VISTARIL) 25 MG tablet Take 1 tablet (25 mg total) by mouth 3 (three) times daily as needed. 90 tablet 0   lisinopril (ZESTRIL) 5 MG tablet      metFORMIN (GLUCOPHAGE) 500 MG tablet Take 2 tablets (1,000 mg total) by mouth 2 (two) times daily with a meal. 360 tablet 1   albuterol (PROAIR HFA) 108 (90 Base) MCG/ACT inhaler Inhale 2 puffs into the lungs every 4 (four) hours as needed for wheezing or shortness of breath.  (Patient not taking: Reported on 06/01/2019) 3 Inhaler 0   Blood Glucose Monitoring Suppl (ACCU-CHEK AVIVA PLUS) w/Device KIT USE AS DIRECTED DAILY. E11.9 (Patient not taking: Reported on 06/01/2019) 1 kit 0   exenatide (BYETTA 5 MCG PEN) 5 MCG/0.02ML SOPN injection Inject 0.02 mLs (5 mcg total) into the skin 2 (two) times daily with a meal. (Patient not taking: Reported on 06/01/2019) 1.2 mL 3   Insulin Pen Needle 31G X 5 MM MISC 1 each by Does not apply route 3 (three) times daily before meals. (Patient not taking: Reported on 06/01/2019) 90 each 1   Lancet Devices (ACCU-CHEK SOFTCLIX) lancets Use as instructed daily. (Patient not taking: Reported on 06/01/2019) 1 each 5   meloxicam (MOBIC) 7.5 MG tablet Take 1 tablet (7.5 mg total) by mouth daily. (Patient not taking: Reported on 06/01/2019) 30 tablet 1   methocarbamol (ROBAXIN) 750 MG tablet Take 1 tablet (750 mg total) by mouth every 8 (eight) hours as needed for muscle spasms. (Patient not taking: Reported on 06/01/2019) 180 tablet 0   podophyllum resin (PODOCON-25) 25 % SOLN Apply 1 mL topically once a week. To wart on L index finger (Patient not taking: Reported on 06/01/2019) 15 mL 0   carvedilol (COREG) 6.25 MG tablet Take 1 tablet (6.25 mg total) by mouth 2 (two) times daily with a meal. (Patient not taking: Reported on 06/01/2019) 180 tablet 0   furosemide (LASIX) 20 MG tablet Take 1 tablet (20 mg total) by mouth daily. (Patient not taking: Reported on 04/05/2018) 30 tablet 12   metoCLOPramide (REGLAN) 5 MG tablet Take 1 tablet (5 mg total) by mouth 4 (four) times daily -  before meals and at bedtime. (Patient not taking: Reported on 07/24/2019) 120 tablet 3   metroNIDAZOLE (FLAGYL) 500 MG tablet Take 1 tablet (500 mg total) by mouth 2 (two) times daily. (Patient not taking: Reported on 07/24/2019) 14 tablet 0   metroNIDAZOLE (FLAGYL) 500 MG tablet Take 1 tablet (500 mg total) by mouth 2 (two) times daily. (Patient not taking: Reported on 07/24/2019)  14 tablet 0   sertraline (ZOLOFT) 50 MG tablet Take 1 tablet (50 mg total) by mouth daily. (Patient not taking: Reported on 04/05/2018) 30 tablet 3   traZODone (DESYREL) 100 MG tablet Take 1 tablet (100 mg total) by mouth at bedtime as needed for sleep. (Patient not taking: Reported on 04/05/2018) 30 tablet 3   Facility-Administered Medications Prior to Visit  Medication Dose Route Frequency Provider Last Rate Last Dose   0.9 %  sodium chloride infusion  500 mL Intravenous  Once Pyrtle, Lajuan Lines, MD       omalizumab Arvid Right) injection 300 mg  300 mg Subcutaneous Q28 days Valentina Shaggy, MD   300 mg at 01/03/18 1210     ROS Review of Systems  Constitutional: Negative for activity change, appetite change and fatigue.  HENT: Negative for congestion, sinus pressure and sore throat.   Eyes: Negative for visual disturbance.  Respiratory: Negative for cough, chest tightness, shortness of breath and wheezing.   Cardiovascular: Negative for chest pain and palpitations.  Gastrointestinal:       See HPI  Endocrine: Negative for polydipsia.  Genitourinary: Negative for dysuria and frequency.  Musculoskeletal: Negative for arthralgias and back pain.  Skin: Negative for rash.  Neurological: Negative for tremors, light-headedness and numbness.  Hematological: Does not bruise/bleed easily.  Psychiatric/Behavioral: Negative for agitation and behavioral problems.    Objective:  BP 123/79    Pulse 76    Temp 98.2 F (36.8 C) (Oral)    Ht _0  (1.6 m)    Wt 184 lb 12.8 oz (83.8 kg)    SpO2 99%    BMI 32.74 kg/m   BP/Weight 07/24/2019 06/01/2019 01/16/5572  Systolic BP 220 254 270  Diastolic BP 79 81 86  Wt. (Lbs) 184.8 190.6 195.4  BMI 32.74 33.76 34.61      Physical Exam Constitutional:      Appearance: She is well-developed.  Neck:     Vascular: No JVD.  Cardiovascular:     Rate and Rhythm: Normal rate.     Heart sounds: Normal heart sounds. No murmur.  Pulmonary:     Effort:  Pulmonary effort is normal.     Breath sounds: Normal breath sounds. No wheezing or rales.  Chest:     Chest wall: No tenderness.  Abdominal:     General: Bowel sounds are normal. There is no distension.     Palpations: Abdomen is soft. There is no mass.     Tenderness: There is no abdominal tenderness.  Musculoskeletal: Normal range of motion.     Right lower leg: No edema.     Left lower leg: No edema.  Neurological:     Mental Status: She is alert and oriented to person, place, and time.  Psychiatric:        Mood and Affect: Mood normal.     CMP Latest Ref Rng & Units 06/01/2019 02/24/2018 07/07/2017  Glucose 65 - 99 mg/dL 105(H) 90 -  BUN 6 - 24 mg/dL 10 8 -  Creatinine 0.57 - 1.00 mg/dL 0.69 0.65 -  Sodium 134 - 144 mmol/L 138 141 -  Potassium 3.5 - 5.2 mmol/L 4.1 4.6 -  Chloride 96 - 106 mmol/L 101 104 -  CO2 20 - 29 mmol/L 20 20 -  Calcium 8.7 - 10.2 mg/dL 9.6 9.4 -  Total Protein 6.0 - 8.5 g/dL 7.0 6.7 7.0  Total Bilirubin 0.0 - 1.2 mg/dL <0.2 <0.2 -  Alkaline Phos 39 - 117 IU/L 106 93 -  AST 0 - 40 IU/L 10 11 -  ALT 0 - 32 IU/L 10 12 -    Lipid Panel     Component Value Date/Time   CHOL 188 06/01/2019 1532   TRIG 259 (H) 06/01/2019 1532   HDL 48 06/01/2019 1532   CHOLHDL 3.9 06/01/2019 1532   CHOLHDL 4.4 10/28/2016 0851   LDLCALC 88 06/01/2019 1532    CBC    Component Value Date/Time   WBC 13.8 (H) 06/01/2019 1532  WBC 9.4 10/12/2016 0517   RBC 4.57 06/01/2019 1532   RBC 4.10 10/12/2016 0517   HGB 11.8 06/01/2019 1532   HCT 37.8 06/01/2019 1532   PLT 413 06/01/2019 1532   MCV 83 06/01/2019 1532   MCH 25.8 (L) 06/01/2019 1532   MCH 28.0 10/12/2016 0517   MCHC 31.2 (L) 06/01/2019 1532   MCHC 33.1 10/12/2016 0517   RDW 17.6 (H) 06/01/2019 1532   LYMPHSABS 2.8 06/01/2019 1532   MONOABS 0.6 10/12/2016 0517   EOSABS 0.1 06/01/2019 1532   BASOSABS 0.1 06/01/2019 1532    Lab Results  Component Value Date   HGBA1C 6.5 07/24/2019    Assessment &  Plan:   1. Gastroparesis due to DM ([place]) Restarted Reglan She was previously on Bydureon but I will hold off at this time due to gastroparesis Decreased metformin dose due to GI side effects and if blood sugars trend up we will have to discuss glipizide or other forms of therapy - POCT glucose (manual entry) - POCT glycosylated hemoglobin (Hb A1C) - Microalbumin/Creatinine Ratio, Urine - Ambulatory referral to Ophthalmology - Ambulatory referral to Gastroenterology - metoCLOPramide (REGLAN) 5 MG tablet; Take 1 tablet (5 mg total) by mouth 4 (four) times daily -  before meals and at bedtime.  Dispense: 120 tablet; Refill: 3 - metFORMIN (GLUCOPHAGE) 500 MG tablet; Take 1 tablet (500 mg total) by mouth 2 (two) times daily with a meal.  Dispense: 180 tablet; Refill: 1  2. Hypertension, unspecified type Controlled Counseled on blood pressure goal of less than 130/80, low-sodium, DASH diet, medication compliance, 150 minutes of moderate intensity exercise per week. Discussed medication compliance, adverse effects. - amLODipine (NORVASC) 10 MG tablet; Take 1 tablet (10 mg total) by mouth daily.  Dispense: 90 tablet; Refill: 0 - lisinopril (ZESTRIL) 5 MG tablet; Take 1 tablet (5 mg total) by mouth daily.  Dispense: 90 tablet; Refill: 1  3. Hyperlipidemia, unspecified hyperlipidemia type Total cholesterol and LDL are at goal however she has hypertriglyceridemia Advised to commence OTC fish oil capsule If triglycerides remain elevated will consider increasing dose of Lipitor - atorvastatin (LIPITOR) 20 MG tablet; Take 1 tablet (20 mg total) by mouth daily.  Dispense: 90 tablet; Refill: 1  4. Anxiety and depression Stable - buPROPion (WELLBUTRIN SR) 150 MG 12 hr tablet; Take 1 tablet (150 mg total) by mouth 2 (two) times daily.  Dispense: 10 tablet; Refill: 1 - hydrOXYzine (ATARAX/VISTARIL) 25 MG tablet; Take 1 tablet (25 mg total) by mouth 3 (three) times daily as needed.  Dispense: 90 tablet;  Refill: 1  5. Type 2 diabetes mellitus with other neurologic complication, without long-term current use of insulin ([place]) See #1 above  6.  Herpes prophylaxis - acyclovir (ZOVIRAX) 400 MG tablet; Take 1 tablet (400 mg total) by mouth 2 (two) times daily.  Dispense: 180 tablet; Refill: 0  7. Screening for viral disease - HIV Antibody (routine testing w rflx)   Meds ordered this encounter  Medications   metFORMIN (GLUCOPHAGE) 500 MG tablet    Sig: Take 1 tablet (500 mg total) by mouth 2 (two) times daily with a meal.    Dispense:  180 tablet    Refill:  1   amLODipine (NORVASC) 10 MG tablet    Sig: Take 1 tablet (10 mg total) by mouth daily.    Dispense:  90 tablet    Refill:  0   atorvastatin (LIPITOR) 20 MG tablet    Sig: Take 1 tablet (20 mg  total) by mouth daily.    Dispense:  90 tablet    Refill:  1   buPROPion (WELLBUTRIN SR) 150 MG 12 hr tablet    Sig: Take 1 tablet (150 mg total) by mouth 2 (two) times daily.    Dispense:  10 tablet    Refill:  1   metoCLOPramide (REGLAN) 5 MG tablet    Sig: Take 1 tablet (5 mg total) by mouth 4 (four) times daily -  before meals and at bedtime.    Dispense:  120 tablet    Refill:  3   lisinopril (ZESTRIL) 5 MG tablet    Sig: Take 1 tablet (5 mg total) by mouth daily.    Dispense:  90 tablet    Refill:  1   hydrOXYzine (ATARAX/VISTARIL) 25 MG tablet    Sig: Take 1 tablet (25 mg total) by mouth 3 (three) times daily as needed.    Dispense:  90 tablet    Refill:  1   acyclovir (ZOVIRAX) 400 MG tablet    Sig: Take 1 tablet (400 mg total) by mouth 2 (two) times daily.    Dispense:  180 tablet    Refill:  0    DX Code Needed  .Z86.19    Follow-up: Return in about 3 months (around 10/23/2019) for medical conditions.       Charlott Rakes, MD, FAAFP. [facility] and [facility], [place]   07/24/2019, 11:10 AM

## 2019-07-25 LAB — MICROALBUMIN / CREATININE URINE RATIO
Creatinine, Urine: 177 mg/dL
Microalb/Creat Ratio: 5 mg/g creat (ref 0–29)
Microalbumin, Urine: 8.7 ug/mL

## 2019-07-25 LAB — HIV ANTIBODY (ROUTINE TESTING W REFLEX): HIV Screen 4th Generation wRfx: NONREACTIVE

## 2019-07-28 ENCOUNTER — Encounter: Payer: Self-pay | Admitting: Family Medicine

## 2019-07-28 ENCOUNTER — Telehealth: Payer: Self-pay | Admitting: Family Medicine

## 2019-07-28 NOTE — Telephone Encounter (Signed)
Hi Amber Rose, please refer to this patient's message below thanks:  "Doctor, I received a call from [facility] and I'm wondering if you meant for me to start with a new gastro doctor or shld I continue with [facility] where I've gone in the past.  THe only reason I could think you switched me is because of my insurance maybe. Either way I just wanted to check with you. If I go to [place] they said I need a release of care and records from [facility]."

## 2019-07-31 NOTE — Telephone Encounter (Signed)
Hi Dr Margarita Rana  The reason that I sent her referral to [facility] it was because [facility] it was not on call  On September but if she is establish with [facility]  She is ok  It was my mistake  . Thanks

## 2019-08-02 ENCOUNTER — Ambulatory Visit: Payer: Medicare HMO | Admitting: Family Medicine

## 2019-08-08 ENCOUNTER — Telehealth: Payer: Self-pay

## 2019-08-08 ENCOUNTER — Encounter: Payer: Self-pay | Admitting: Family Medicine

## 2019-08-08 NOTE — Telephone Encounter (Signed)
Patient is needing a prior Auth for her Dexcom meter she has new insurance information.   ID [id number] Group [id number] This is [employer] ppo.

## 2019-08-09 ENCOUNTER — Ambulatory Visit (INDEPENDENT_AMBULATORY_CARE_PROVIDER_SITE_OTHER): Payer: Medicare HMO | Admitting: Physician Assistant

## 2019-08-09 ENCOUNTER — Encounter: Payer: Self-pay | Admitting: Physician Assistant

## 2019-08-09 VITALS — BP 120/60 | HR 73 | Temp 98.4°F | Ht 63.5 in | Wt 185.0 lb

## 2019-08-09 DIAGNOSIS — R1310 Dysphagia, unspecified: Secondary | ICD-10-CM | POA: Diagnosis not present

## 2019-08-09 DIAGNOSIS — R194 Change in bowel habit: Secondary | ICD-10-CM

## 2019-08-09 DIAGNOSIS — K3184 Gastroparesis: Secondary | ICD-10-CM | POA: Diagnosis not present

## 2019-08-09 MED ORDER — NA SULFATE-K SULFATE-MG SULF 17.5-3.13-1.6 GM/177ML PO SOLN: ORAL | 0 refills | Status: DC

## 2019-08-09 NOTE — Progress Notes (Signed)
Chief Complaint: Gastroparesis  HPI:    Mrs. Bowell is a 43 year old female with a past medical history as listed below including diabetes and gastroparesis, known to Dr. Hilarie Fredrickson, who was referred to me by Charlott Rakes, MD for a complaint of gastroparesis.      03/03/2018 patient saw Dr. Hilarie Fredrickson in clinic.  At that time discussed she had recently been seen 12/13/2017 undergone EGD which showed a 2 cm hiatal hernia and large amount of food in the stomach consistent with gastroparesis.  After that visit she had a gastric emptying study which was normal.  Despite that patient continue with frequent upper abdominal pain bloating, pain, tightness and nausea with rare vomiting, worse after eating.  She had been abiding by gastroparesis diet also describes some periorbital swelling.  At that time discussed despite her negative gastric emptying study it was felt that she had gastroparesis based on findings from endoscopy along with her classic symptoms.  Was recommended she continue gastroparesis diet.  She was tried on Reglan 5 mg 3 times daily before meals and at bedtime.  Was recommended she come back in 3 months to determine if that is been beneficial for her.  Her periorbital edema/headache was discussed and thought possible related to menstrual cycle and she was seeing GYN as the next step.  GERD is well controlled on omeprazole 40 mg daily.       07/24/2019 hemoglobin A1c 6.5.    Today, the patient tells me that the past year for her is been very hard.  She moved to [place] to be with her mother who lives on the third floor of an assisted living care facility and the patient really never left her room.  Due to this she was not taking care of herself and all of her symptoms have gotten worse.  Tells me that she has a worsened feeling of epigastric pain and bad gas and bloating as well as nausea.  Also has a feeling of dysphagia and tells me it feels as though food gets stuck in her throat at times and then it  just "comes back up".  Tells me this seems worse when she goes from a laying to a sitting position.  Describes episodes of regurgitation and vomiting.    Also complains today that she has started to see food and pills in her stool and it looks like nothing is being digested at all.  A lot of bloating and will feel "all stopped up", this can go on for a couple of days and then she will have terrible diarrhea.  Tells me that when she feels empty she feels nauseous and has terrible gas that can last for minutes at a time.  Recently started Reglan again over the past 2 weeks.  Was prescribed 5 mg 4 times daily by her PCP but tells me that she rarely remembers to take it that often because she just "does not feel like it is helping".  Is also on Omeprazole 40 mg daily.    Patient does admit that she hjas not been keeping good tabs on her diabetes either, given her "year off".  Does smoke marijuana at least 3 to 4 days out of the week and has done this for years of her life.    Denies fever, chills, blood in her stool or symptoms that awaken her from sleep.     Past Medical History:  Diagnosis Date   Allergy    Anxiety    Arthritis  Chronic UTI    Depression    Diabetes mellitus without complication ([place])    Diverticulosis    Fibromyalgia    GERD (gastroesophageal reflux disease)    Hiatal hernia    Hyperlipidemia    Hypertension    Internal hemorrhoids    Meningitis, viral    Pancreatitis     Past Surgical History:  Procedure Laterality Date   APPENDECTOMY  1990   ceasarian  2002   CESAREAN SECTION     x1   CHOLECYSTECTOMY  2011   COLONOSCOPY     UPPER GASTROINTESTINAL ENDOSCOPY     URINARY SURGERY     urethra sling, removal, and then revision 2016 (4 surgeries)   WISDOM TOOTH EXTRACTION      Current Outpatient Medications  Medication Sig Dispense Refill   acyclovir (ZOVIRAX) 400 MG tablet Take 1 tablet (400 mg total) by mouth 2 (two) times daily. 180  tablet 0   albuterol (PROAIR HFA) 108 (90 Base) MCG/ACT inhaler Inhale 2 puffs into the lungs every 4 (four) hours as needed for wheezing or shortness of breath. (Patient not taking: Reported on 06/01/2019) 3 Inhaler 0   amLODipine (NORVASC) 10 MG tablet Take 1 tablet (10 mg total) by mouth daily. 90 tablet 0   atorvastatin (LIPITOR) 20 MG tablet Take 1 tablet (20 mg total) by mouth daily. 90 tablet 1   Blood Glucose Monitoring Suppl (ACCU-CHEK AVIVA PLUS) w/Device KIT USE AS DIRECTED DAILY. E11.9 (Patient not taking: Reported on 06/01/2019) 1 kit 0   buPROPion (WELLBUTRIN SR) 150 MG 12 hr tablet Take 1 tablet (150 mg total) by mouth 2 (two) times daily. 10 tablet 1   exenatide (BYETTA 5 MCG PEN) 5 MCG/0.02ML SOPN injection Inject 0.02 mLs (5 mcg total) into the skin 2 (two) times daily with a meal. (Patient not taking: Reported on 06/01/2019) 1.2 mL 3   hydrOXYzine (ATARAX/VISTARIL) 25 MG tablet Take 1 tablet (25 mg total) by mouth 3 (three) times daily as needed. 90 tablet 1   ibuprofen (ADVIL,MOTRIN) 600 MG tablet Take 600 mg by mouth every 6 (six) hours as needed.     Insulin Pen Needle 31G X 5 MM MISC 1 each by Does not apply route 3 (three) times daily before meals. (Patient not taking: Reported on 06/01/2019) 90 each 1   Lancet Devices (ACCU-CHEK SOFTCLIX) lancets Use as instructed daily. (Patient not taking: Reported on 06/01/2019) 1 each 5   lisinopril (ZESTRIL) 5 MG tablet Take 1 tablet (5 mg total) by mouth daily. 90 tablet 1   meloxicam (MOBIC) 7.5 MG tablet Take 1 tablet (7.5 mg total) by mouth daily. (Patient not taking: Reported on 06/01/2019) 30 tablet 1   metFORMIN (GLUCOPHAGE) 500 MG tablet Take 1 tablet (500 mg total) by mouth 2 (two) times daily with a meal. 180 tablet 1   methocarbamol (ROBAXIN) 750 MG tablet Take 1 tablet (750 mg total) by mouth every 8 (eight) hours as needed for muscle spasms. (Patient not taking: Reported on 06/01/2019) 180 tablet 0   metoCLOPramide (REGLAN)  5 MG tablet Take 1 tablet (5 mg total) by mouth 4 (four) times daily -  before meals and at bedtime. 120 tablet 3   omeprazole (PRILOSEC) 40 MG capsule Take 1 tab every morning. 90 capsule 3   podophyllum resin (PODOCON-25) 25 % SOLN Apply 1 mL topically once a week. To wart on L index finger (Patient not taking: Reported on 06/01/2019) 15 mL 0   Vitamin D, Ergocalciferol, (  DRISDOL) 1.25 MG (50000 UT) CAPS capsule Take 1 capsule (50,000 Units total) by mouth every 7 (seven) days. 16 capsule 0   Current Facility-Administered Medications  Medication Dose Route Frequency Provider Last Rate Last Dose   0.9 %  sodium chloride infusion  500 mL Intravenous Once Pyrtle, Lajuan Lines, MD       omalizumab Arvid Right) injection 300 mg  300 mg Subcutaneous Q28 days Valentina Shaggy, MD   300 mg at 01/03/18 1210    Allergies as of 08/09/2019 - Review Complete 07/24/2019  Allergen Reaction Noted   Metformin and related Nausea And Vomiting 10/12/2016   Penicillins  10/12/2016   Xanax [alprazolam]  10/12/2016   Glyburide Other (See Comments) 02/04/2015   Linagliptin Diarrhea and Other (See Comments) 07/18/2014   Moxifloxacin Diarrhea 04/10/2014    Family History  Problem Relation Age of Onset   Cancer Mother        cervical, breast, lungm skin   Diabetes Father    Multiple sclerosis Paternal Grandmother    Allergic rhinitis Neg Hx    Angioedema Neg Hx    Asthma Neg Hx    Eczema Neg Hx    Urticaria Neg Hx    Colon cancer Neg Hx    Esophageal cancer Neg Hx    Liver disease Neg Hx    Pancreatic cancer Neg Hx    Prostate cancer Neg Hx    Rectal cancer Neg Hx    Stomach cancer Neg Hx     Social History   Socioeconomic History   Marital status: Single    Spouse name: Not on file   Number of children: Not on file   Years of education: Not on file   Highest education level: Not on file  Occupational History   Not on file  Social Needs   Financial resource strain:  Not on file   Food insecurity    Worry: Not on file    Inability: Not on file   Transportation needs    Medical: Not on file    Non-medical: Not on file  Tobacco Use   Smoking status: Current Every Day Smoker    Packs/day: 1.00    Years: 20.00    Pack years: 20.00    Types: Cigarettes   Smokeless tobacco: Never Used  Substance and Sexual Activity   Alcohol use: Yes    Frequency: Never    Comment: rarely   Drug use: Yes    Types: Marijuana    Comment: last smoker 11-14-17   Sexual activity: Never    Comment: pt on her mentral cycle now began 11-14-17  Lifestyle   Physical activity    Days per week: Not on file    Minutes per session: Not on file   Stress: Not on file  Relationships   Social connections    Talks on phone: Not on file    Gets together: Not on file    Attends religious service: Not on file    Active member of club or organization: Not on file    Attends meetings of clubs or organizations: Not on file    Relationship status: Not on file   Intimate partner violence    Fear of current or ex partner: Not on file    Emotionally abused: Not on file    Physically abused: Not on file    Forced sexual activity: Not on file  Other Topics Concern   Not on file  Social History Narrative  Lives home with adult niece.  Not working.  Disability pending.  Pt is single.  Education GED.  3 children.     Review of Systems:    Constitutional: No weight loss, fever or chills Cardiovascular: No chest pain  Respiratory: No SOB  Gastrointestinal: See HPI and otherwise negative   Physical Exam:  Vital signs: BP 120/60    Pulse 73    Temp 98.4 F (36.9 C)    Ht 5' 3.5" (1.613 m)    Wt 185 lb (83.9 kg)    BMI 32.26 kg/m   Constitutional:   Pleasant female appears to be in NAD, Well developed, Well nourished, alert and cooperative Head:  Normocephalic and atraumatic. Eyes:   PEERL, EOMI. No icterus. Conjunctiva pink. Ears:  Normal auditory acuity. Neck:   Supple Throat: Oral cavity and pharynx without inflammation, swelling or lesion.  Respiratory: Respirations even and unlabored. Lungs clear to auscultation bilaterally.   No wheezes, crackles, or rhonchi.  Cardiovascular: Normal S1, S2. No MRG. Regular rate and rhythm. No peripheral edema, cyanosis or pallor.  Gastrointestinal:  Soft, nondistended, mild generalized ttp, worse in the epigastrum. No rebound or guarding. Decreased BS all four quadrants. No appreciable masses or hepatomegaly. Rectal:  Not performed.  Msk:  Symmetrical without gross deformities. Without edema, no deformity or joint abnormality.  Neurologic:  Alert and  oriented x4;  grossly normal neurologically.  Skin:   Dry and intact without significant lesions or rashes. Psychiatric: Demonstrates good judgement and reason without abnormal affect or behaviors.  RELEVANT LABS AND IMAGING: CBC    Component Value Date/Time   WBC 13.8 (H) 06/01/2019 1532   WBC 9.4 10/12/2016 0517   RBC 4.57 06/01/2019 1532   RBC 4.10 10/12/2016 0517   HGB 11.8 06/01/2019 1532   HCT 37.8 06/01/2019 1532   PLT 413 06/01/2019 1532   MCV 83 06/01/2019 1532   MCH 25.8 (L) 06/01/2019 1532   MCH 28.0 10/12/2016 0517   MCHC 31.2 (L) 06/01/2019 1532   MCHC 33.1 10/12/2016 0517   RDW 17.6 (H) 06/01/2019 1532   LYMPHSABS 2.8 06/01/2019 1532   MONOABS 0.6 10/12/2016 0517   EOSABS 0.1 06/01/2019 1532   BASOSABS 0.1 06/01/2019 1532    CMP     Component Value Date/Time   NA 138 06/01/2019 1532   K 4.1 06/01/2019 1532   CL 101 06/01/2019 1532   CO2 20 06/01/2019 1532   GLUCOSE 105 (H) 06/01/2019 1532   GLUCOSE 95 10/28/2016 0851   BUN 10 06/01/2019 1532   CREATININE 0.69 06/01/2019 1532   CREATININE 0.64 10/28/2016 0851   CALCIUM 9.6 06/01/2019 1532   PROT 7.0 06/01/2019 1532   ALBUMIN 4.1 06/01/2019 1532   AST 10 06/01/2019 1532   ALT 10 06/01/2019 1532   ALKPHOS 106 06/01/2019 1532   BILITOT <0.2 06/01/2019 1532   GFRNONAA 107  06/01/2019 1532   GFRNONAA >89 10/28/2016 0851   GFRAA 123 06/01/2019 1532   GFRAA >89 10/28/2016 0851    Assessment: 1.  Gastroparesis: Diagnosed via EGD in February 2019, symptoms have worsened and symptoms seem unhelped by Reglan now or in the past, with description of new dysphagia will repeat EGD 2.  Dysphagia: Food feels like a get stuck in her throat 3.  Change in bowel habits: Worsened variation between constipation and diarrhea, can often see whole food and pills in her feces  Plan: 1.  Scheduled patient for an EGD given description of dysphagia and worsening gastroparesis.  Also scheduled patient for a colonoscopy given her extreme variation in bowel habits which has changed over the past few months.  Scheduled these in the [place] with Dr. Hilarie Fredrickson.  Did discuss risks, benefits, limitations and alternatives the patient agrees proceed. 2.  Continue Reglan 5 mg 4 times daily, 20-30 minutes before meals and at bedtime.  Discussed the patient that if she can be more diligent with this it might actually help her symptoms. 3.  Also discussed that strict control of her glucose will help with the symptoms, she is trying to get back on track with these things. 4.  Reviewed a gastroparesis diet 5.  Discussed with the patient that she could try abstaining from marijuana for 3 to 4 weeks to see if this helps any of her GI symptoms. 6.  Also discussed with the patient that likely there is an element of IBS given her increased stress and anxiety lately. 7.  Patient to follow in clinic per recommendations from Dr. Hilarie Fredrickson after time of procedures.  Ellouise Newer, PA-C [facility] 08/09/2019, 11:38 AM  Cc: Charlott Rakes, MD

## 2019-08-09 NOTE — Patient Instructions (Signed)
If you are age 43 or older, your body mass index should be between 23-30. Your Body mass index is 32.26 kg/m. If this is out of the aforementioned range listed, please consider follow up with your Primary Care Provider.  If you are age 48 or younger, your body mass index should be between 19-25. Your Body mass index is 32.26 kg/m. If this is out of the aformentioned range listed, please consider follow up with your Primary Care Provider.   You have been scheduled for an endoscopy and colonoscopy. Please follow the written instructions given to you at your visit today. Please pick up your prep supplies at the pharmacy within the next 1-3 days. If you use inhalers (even only as needed), please bring them with you on the day of your procedure. Your physician has requested that you go to www.startemmi.com and enter the access code given to you at your visit today. This web site gives a general overview about your procedure. However, you should still follow specific instructions given to you by our office regarding your preparation for the procedure.  We have sent the following medications to your pharmacy for you to pick up at your convenience: Suprep  Continue Reglan 5 mg four times daily.  Take a break from marijuana completely for 3-4 weeks to see if this helps.  Thank you for choosing me and [facility].    Ellouise Newer, PA-C

## 2019-08-12 NOTE — Progress Notes (Signed)
Addendum: Reviewed and agree with assessment and management plan. Faviola Klare M, MD  

## 2019-08-15 ENCOUNTER — Encounter: Payer: Self-pay | Admitting: Internal Medicine

## 2019-08-16 ENCOUNTER — Telehealth: Payer: Self-pay | Admitting: Internal Medicine

## 2019-08-16 MED ORDER — DEXCOM G6 RECEIVER DEVI: 1.0000 | 0 refills | Status: DC

## 2019-08-16 MED ORDER — DEXCOM G6 SENSOR MISC: 1.0000 | 11 refills | Status: DC

## 2019-08-16 MED ORDER — DEXCOM G6 TRANSMITTER MISC: 1.0000 | 3 refills | Status: DC

## 2019-08-16 NOTE — Telephone Encounter (Signed)
Can you please provide education on use of Dexcom meter?  Prescription has been sent to her pharmacy thank you.

## 2019-08-16 NOTE — Telephone Encounter (Signed)
I don't see a Dexcom meter anywhere on her med list?  When I call what exactly am I requesting because they will ask for the specific meter/diagnosis and whatnot.  Did Dr. Margarita Rana send a request for the meter to a pharmacy, and which pharmacy so I can call them and see what the rejection says.

## 2019-08-16 NOTE — Telephone Encounter (Signed)
Patient is requesting a Dexcom meter to check her sugars. Patient is needing a script sent over to her pharmacy.

## 2019-08-16 NOTE — Telephone Encounter (Signed)

## 2019-08-17 ENCOUNTER — Encounter: Payer: Self-pay | Admitting: Internal Medicine

## 2019-08-17 ENCOUNTER — Ambulatory Visit (AMBULATORY_SURGERY_CENTER): Payer: Medicare HMO | Admitting: Internal Medicine

## 2019-08-17 ENCOUNTER — Other Ambulatory Visit: Payer: Self-pay

## 2019-08-17 VITALS — BP 123/87 | HR 67 | Temp 98.5°F | Resp 13 | Ht 63.5 in | Wt 185.0 lb

## 2019-08-17 DIAGNOSIS — I10 Essential (primary) hypertension: Secondary | ICD-10-CM | POA: Diagnosis not present

## 2019-08-17 DIAGNOSIS — D122 Benign neoplasm of ascending colon: Secondary | ICD-10-CM

## 2019-08-17 DIAGNOSIS — R194 Change in bowel habit: Secondary | ICD-10-CM | POA: Diagnosis not present

## 2019-08-17 DIAGNOSIS — K3184 Gastroparesis: Secondary | ICD-10-CM | POA: Diagnosis not present

## 2019-08-17 DIAGNOSIS — K2980 Duodenitis without bleeding: Secondary | ICD-10-CM | POA: Diagnosis not present

## 2019-08-17 DIAGNOSIS — E119 Type 2 diabetes mellitus without complications: Secondary | ICD-10-CM | POA: Diagnosis not present

## 2019-08-17 DIAGNOSIS — R1310 Dysphagia, unspecified: Secondary | ICD-10-CM | POA: Diagnosis not present

## 2019-08-17 MED ORDER — SODIUM CHLORIDE 0.9 % IV SOLN: 500.0000 mL | Freq: Once | INTRAVENOUS | Status: DC

## 2019-08-17 NOTE — Progress Notes (Signed)
Called to room to assist during endoscopic procedure.  Patient ID and intended procedure confirmed with present staff. Received instructions for my participation in the procedure from the performing physician.  

## 2019-08-17 NOTE — Telephone Encounter (Signed)
I will be happy to provide education on the use of the Dexcom.   Claiborne Billings - will you let me know when the PA is approved so I can reach out to the patient?

## 2019-08-17 NOTE — Op Note (Signed)
[facility] Patient Name: Amber Rose Procedure Date: 08/17/2019 8:39 AM MRN: [id number] Endoscopist: Jerene Bears , MD Age: 43 Referring MD:  Date of Birth: 1975/10/28 Gender: Female Account #: [id number] Procedure:                Colonoscopy Indications:              Change in bowel habits Medicines:                Monitored Anesthesia Care Procedure:                Pre-Anesthesia Assessment:                           - Prior to the procedure, a History and Physical                            was performed, and patient medications and                            allergies were reviewed. The patient's tolerance of                            previous anesthesia was also reviewed. The risks                            and benefits of the procedure and the sedation                            options and risks were discussed with the patient.                            All questions were answered, and informed consent                            was obtained. Prior Anticoagulants: The patient has                            taken no previous anticoagulant or antiplatelet                            agents. ASA Grade Assessment: II - A patient with                            mild systemic disease. After reviewing the risks                            and benefits, the patient was deemed in                            satisfactory condition to undergo the procedure.                           After obtaining informed consent, the colonoscope  was passed under direct vision. Throughout the                            procedure, the patient's blood pressure, pulse, and                            oxygen saturations were monitored continuously. The                            Colonoscope was introduced through the anus and                            advanced to the terminal ileum. The colonoscopy was                            performed without difficulty. The  patient tolerated                            the procedure well. The quality of the bowel                            preparation was good. The terminal ileum, ileocecal                            valve, appendiceal orifice, and rectum were                            photographed. Scope In: 8:48:41 AM Scope Out: 9:00:27 AM Scope Withdrawal Time: 0 hours 8 minutes 55 seconds  Total Procedure Duration: 0 hours 11 minutes 46 seconds  Findings:                 The digital rectal exam was normal.                           The terminal ileum appeared normal.                           A 4 mm polyp was found in the ascending colon. The                            polyp was sessile. The polyp was removed with a                            cold snare. Resection and retrieval were complete.                           The exam was otherwise without abnormality on                            direct and retroflexion views. Complications:            No immediate complications. Estimated Blood Loss:     Estimated blood loss was minimal. Impression:               -  The examined portion of the ileum was normal.                           - One 4 mm polyp in the ascending colon, removed                            with a cold snare. Resected and retrieved.                           - The examination was otherwise normal on direct                            and retroflexion views. Recommendation:           - Patient has a contact number available for                            emergencies. The signs and symptoms of potential                            delayed complications were discussed with the                            patient. Return to normal activities tomorrow.                            Written discharge instructions were provided to the                            patient.                           - Resume previous diet.                           - Continue present medications.                            - Await pathology results.                           - Repeat colonoscopy is recommended for                            surveillance. The colonoscopy date will be                            determined after pathology results from today's                            exam become available for review. Jerene Bears, MD 08/17/2019 9:07:53 AM This report has been signed electronically.

## 2019-08-17 NOTE — Patient Instructions (Signed)
HANDOUTS PROVIDED ON: ESOPHAGITIS AND STRICTURE & POLYPS  THE BIOPSY AND POLYPS TAKEN TODAY HAVE BEEN SENT TO PATHOLOGY.  THE RESULTS CAN TAKE 2-3 WEEKS TO RECEIVE.  THE NEXT RECOMMENDED COLONOSCOPY WILL BE BASED ON THE PATHOLOGY RESULTS.  YOU MAY RESUME YOUR PREVIOUS DIET AND MEDICATION SCHEDULE TODAY.  [place] YOU FOR ALLOWING [place] TO CARE FOR YOU TODAY!!  YOU HAD AN ENDOSCOPIC PROCEDURE TODAY AT [facility]:   Refer to the procedure report that was given to you for any specific questions about what was found during the examination.  If the procedure report does not answer your questions, please call your gastroenterologist to clarify.  If you requested that your care partner not be given the details of your procedure findings, then the procedure report has been included in a sealed envelope for you to review at your convenience later.  YOU SHOULD EXPECT: Some feelings of bloating in the abdomen. Passage of more gas than usual.  Walking can help get rid of the air that was put into your GI tract during the procedure and reduce the bloating. If you had a lower endoscopy (such as a colonoscopy or flexible sigmoidoscopy) you may notice spotting of blood in your stool or on the toilet paper. If you underwent a bowel prep for your procedure, you may not have a normal bowel movement for a few days.  Please Note:  You might notice some irritation and congestion in your nose or some drainage.  This is from the oxygen used during your procedure.  There is no need for concern and it should clear up in a day or so.  SYMPTOMS TO REPORT IMMEDIATELY:   Following lower endoscopy (colonoscopy or flexible sigmoidoscopy):  Excessive amounts of blood in the stool  Significant tenderness or worsening of abdominal pains  Swelling of the abdomen that is new, acute  Fever of 100F or higher   Following upper endoscopy (EGD)  Vomiting of blood or coffee ground material  New chest pain or pain under  the shoulder blades  Painful or persistently difficult swallowing  New shortness of breath  Fever of 100F or higher  Black, tarry-looking stools  For urgent or emergent issues, a gastroenterologist can be reached at any hour by calling [phone].   DIET:  We do recommend a small meal at first, but then you may proceed to your regular diet.  Drink plenty of fluids but you should avoid alcoholic beverages for 24 hours.  ACTIVITY:  You should plan to take it easy for the rest of today and you should NOT DRIVE or use heavy machinery until tomorrow (because of the sedation medicines used during the test).    FOLLOW UP: Our staff will call the number listed on your records 48-72 hours following your procedure to check on you and address any questions or concerns that you may have regarding the information given to you following your procedure. If we do not reach you, we will leave a message.  We will attempt to reach you two times.  During this call, we will ask if you have developed any symptoms of COVID 19. If you develop any symptoms (ie: fever, flu-like symptoms, shortness of breath, cough etc.) before then, please call [phone].  If you test positive for Covid 19 in the 2 weeks post procedure, please call and report this information to [place].    If any biopsies were taken you will be contacted by phone or by letter within the  next 1-3 weeks.  Please call us at [phone] if you have not heard about the biopsies in 3 weeks.    SIGNATURES/CONFIDENTIALITY: You and/or your care partner have signed paperwork which will be entered into your electronic medical record.  These signatures attest to the fact that that the information above on your After Visit Summary has been reviewed and is understood.  Full responsibility of the confidentiality of this discharge information lies with you and/or your care-partner.

## 2019-08-17 NOTE — Op Note (Signed)
[place] Patient Name: Amber Rose Procedure Date: 08/17/2019 8:39 AM MRN: [id number] Endoscopist: Jerene Bears , MD Age: 43 Referring MD:  Date of Birth: 12/02/1975 Gender: Female Account #: [id number] Procedure:                Upper GI endoscopy Indications:              Epigastric abdominal pain, Dysphagia, Nausea Medicines:                Monitored Anesthesia Care Procedure:                Pre-Anesthesia Assessment:                           - Prior to the procedure, a History and Physical                            was performed, and patient medications and                            allergies were reviewed. The patient's tolerance of                            previous anesthesia was also reviewed. The risks                            and benefits of the procedure and the sedation                            options and risks were discussed with the patient.                            All questions were answered, and informed consent                            was obtained. Prior Anticoagulants: The patient has                            taken no previous anticoagulant or antiplatelet                            agents. ASA Grade Assessment: II - A patient with                            mild systemic disease. After reviewing the risks                            and benefits, the patient was deemed in                            satisfactory condition to undergo the procedure.                           After obtaining informed consent, the endoscope was  passed under direct vision. Throughout the                            procedure, the patient's blood pressure, pulse, and                            oxygen saturations were monitored continuously. The                            Endoscope was introduced through the mouth, and                            advanced to the second part of duodenum. The upper                            GI  endoscopy was accomplished without difficulty.                            The patient tolerated the procedure well. Scope In: Scope Out: Findings:                 The examined esophagus was normal.                           No endoscopic abnormality was evident in the                            esophagus to explain the patient's complaint of                            dysphagia. It was decided, however, to proceed with                            dilation of the entire esophagus. The scope was                            withdrawn. Dilation was performed with a Maloney                            dilator with mild resistance at 46 Fr.                           The entire examined stomach was normal. Biopsies                            were taken with a cold forceps for histology and                            Helicobacter pylori testing.                           Diffuse moderate inflammation characterized by                            erosions and  erythema was found in the duodenal                            bulb.                           The second portion of the duodenum was normal. Complications:            No immediate complications. Estimated Blood Loss:     Estimated blood loss was minimal. Impression:               - Normal esophagus. Esophagus dilated with 54 Fr                            Maloney.                           - Normal stomach. Biopsied.                           - Acute duodenitis.                           - Normal second portion of the duodenum. Recommendation:           - Patient has a contact number available for                            emergencies. The signs and symptoms of potential                            delayed complications were discussed with the                            patient. Return to normal activities tomorrow.                            Written discharge instructions were provided to the                            patient.                            - Resume previous diet.                           - Continue present medications.                           - Await pathology results. Jerene Bears, MD 08/17/2019 9:05:59 AM This report has been signed electronically.

## 2019-08-17 NOTE — Progress Notes (Signed)
Report given to PACU, vss 

## 2019-08-21 ENCOUNTER — Telehealth: Payer: Self-pay

## 2019-08-21 NOTE — Telephone Encounter (Signed)
  Follow up Call-  Call back number 08/17/2019 11/16/2017  Post procedure Call Back phone  # [phone] cell  Permission to leave phone message Yes Yes  Some recent data might be hidden     Patient questions:  Do you have a fever, pain , or abdominal swelling? No. Pain Score  0 *  Have you tolerated food without any problems? Yes.    Have you been able to return to your normal activities? Yes.    Do you have any questions about your discharge instructions: Diet   No. Medications  No. Follow up visit  No.  Do you have questions or concerns about your Care? No.  Actions: * If pain score is 4 or above: No action needed, pain <4.  1. Have you developed a fever since your procedure? no  2.   Have you had an respiratory symptoms (SOB or cough) since your procedure? no  3.   Have you tested positive for COVID 19 since your procedure no  4.   Have you had any family members/close contacts diagnosed with the COVID 19 since your procedure?  no   If yes to any of these questions please route to Joylene John, RN and Alphonsa Gin, Therapist, sports.

## 2019-08-22 ENCOUNTER — Encounter (HOSPITAL_BASED_OUTPATIENT_CLINIC_OR_DEPARTMENT_OTHER): Payer: Medicare HMO | Admitting: Family Medicine

## 2019-08-22 ENCOUNTER — Other Ambulatory Visit: Payer: Self-pay | Admitting: Family Medicine

## 2019-08-22 DIAGNOSIS — M791 Myalgia, unspecified site: Secondary | ICD-10-CM

## 2019-08-22 DIAGNOSIS — M7542 Impingement syndrome of left shoulder: Secondary | ICD-10-CM

## 2019-08-22 DIAGNOSIS — M7540 Impingement syndrome of unspecified shoulder: Secondary | ICD-10-CM

## 2019-08-22 DIAGNOSIS — Z8619 Personal history of other infectious and parasitic diseases: Secondary | ICD-10-CM

## 2019-08-22 MED ORDER — PREDNISONE 20 MG PO TABS: 20.0000 mg | ORAL_TABLET | Freq: Every day | ORAL | 0 refills | Status: DC

## 2019-08-22 NOTE — Telephone Encounter (Signed)
Still working on the billing-pt is calling [facility] with her Medicare part B card info.  I will continue to follow-up with [facility] and keep you updated.

## 2019-08-22 NOTE — Telephone Encounter (Signed)
Amber Burnettis a 43 year old female with type 2 diabetes mellitus (A1c 6.5), hypertension, depression and anxiety, hydradenitis, tobacco abuse, fibromyalgia with my chart complaints as documented below. In the past she has had myalgia which has resulted in difficulty ambulating and responded well to prednisone. We will prescribe prednisone again and increase Metformin dose. She will need an office visit if symptoms persist.

## 2019-08-24 ENCOUNTER — Encounter: Payer: Self-pay | Admitting: Internal Medicine

## 2019-08-30 ENCOUNTER — Other Ambulatory Visit: Payer: Self-pay

## 2019-08-30 ENCOUNTER — Ambulatory Visit: Payer: Medicare HMO | Attending: Family Medicine | Admitting: Family Medicine

## 2019-08-30 DIAGNOSIS — E1149 Type 2 diabetes mellitus with other diabetic neurological complication: Secondary | ICD-10-CM

## 2019-08-30 DIAGNOSIS — M7542 Impingement syndrome of left shoulder: Secondary | ICD-10-CM | POA: Diagnosis not present

## 2019-08-30 DIAGNOSIS — M7540 Impingement syndrome of unspecified shoulder: Secondary | ICD-10-CM

## 2019-08-30 DIAGNOSIS — M5431 Sciatica, right side: Secondary | ICD-10-CM

## 2019-08-30 MED ORDER — BYDUREON 2 MG ~~LOC~~ PEN: 2.0000 mg | PEN_INJECTOR | SUBCUTANEOUS | 6 refills | Status: DC

## 2019-08-30 NOTE — Progress Notes (Signed)
Virtual Visit via Telephone Note  I connected with Amber Rose, on 08/30/2019 at 2:00 PM by telephone due to the COVID-19 pandemic and verified that I am speaking with the correct person using two identifiers.   Consent: I discussed the limitations, risks, security and privacy concerns of performing an evaluation and management service by telephone and the availability of in person appointments. I also discussed with the patient that there may be a patient responsible charge related to this service. The patient expressed understanding and agreed to proceed.   Location of Patient: Home  Location of Provider: Clinic   Persons participating in Telemedicine visit: Ellisha Bankson Farrington-CMA Dr. Felecia Shelling     History of Present Illness: Amber Rose a 43 year old female with type 2 diabetes mellitus (A1c 6.5), hypertension, depression and anxiety, hydradenitis, tobacco abuse, fibromyalgia here for follow-up visit. She had relocated to [place] last year but returned a month ago and is here to stay.Her Bydureon had been discontinued while in [place] and she was placed on Metformin.   States she is unable to take Metformin which Is causing her to vomit  And is causing loose bowel. She would like to go back on Bydureon even though she has  Gastroparesis. Her right leg is hurting with problems walking for the last one month and difficulty sitting in the car. Her left shoulder pain has improved with the course of steroid she received when she had send me a MyChart message. Pain is between the knee and hip and her hip catches that she feels like she is about to fall. Thigh symptoms are like a 7-8/10.  The symptoms are chronic she had them prior to relocating to [place] last year.  Pain shoots down her right thigh. Gabapentin cause brain fog; she did not well on Cymbalta.  She also wants to mention she  is still having facial swelling, blurry vision, headache and will  be seeing an Ophthalmology. She is doing Xyzal which has been helpful for her headaches. Declines flonase. She thinks it is related to her periods and is hormonal. Seen by Gyn and Allergy in the past with no improvement.  Past Medical History:  Diagnosis Date  . Allergy   . Anxiety   . Arthritis   . Chronic UTI   . Depression   . Diabetes mellitus without complication ([place])   . Diverticulosis   . Fibromyalgia   . GERD (gastroesophageal reflux disease)   . Hiatal hernia   . Hyperlipidemia   . Hypertension   . Internal hemorrhoids   . Meningitis, viral   . Pancreatitis    Allergies  Allergen Reactions  . Metformin And Related Nausea And Vomiting  . Penicillins     childhood  . Xanax [Alprazolam]     "Caused upper respiratory symptoms" per pt  . Glyburide Other (See Comments)    "blood sugar dropped uncontrollably"  . Linagliptin Diarrhea and Other (See Comments)    Stomach pain, sinus infection  . Moxifloxacin Diarrhea    Current Outpatient Medications on File Prior to Visit  Medication Sig Dispense Refill  . acyclovir (ZOVIRAX) 400 MG tablet TAKE 1 TABLET BY MOUTH TWICE A DAY 180 tablet 0  . albuterol (PROAIR HFA) 108 (90 Base) MCG/ACT inhaler Inhale 2 puffs into the lungs every 4 (four) hours as needed for wheezing or shortness of breath. 3 Inhaler 0  . amLODipine (NORVASC) 10 MG tablet Take 1 tablet (10 mg total) by mouth daily. 90 tablet 0  . atorvastatin (  LIPITOR) 20 MG tablet Take 1 tablet (20 mg total) by mouth daily. 90 tablet 1  . buPROPion (WELLBUTRIN SR) 150 MG 12 hr tablet Take 1 tablet (150 mg total) by mouth 2 (two) times daily. 10 tablet 1  . hydrOXYzine (ATARAX/VISTARIL) 25 MG tablet Take 1 tablet (25 mg total) by mouth 3 (three) times daily as needed. 90 tablet 1  . ibuprofen (ADVIL,MOTRIN) 600 MG tablet Take 600 mg by mouth every 6 (six) hours as needed.    Marland Kitchen lisinopril (ZESTRIL) 5 MG tablet Take 1 tablet (5 mg total) by mouth daily. 90 tablet 1  .  methocarbamol (ROBAXIN) 750 MG tablet Take 1 tablet (750 mg total) by mouth every 8 (eight) hours as needed for muscle spasms. 180 tablet 0  . metoCLOPramide (REGLAN) 5 MG tablet Take 1 tablet (5 mg total) by mouth 4 (four) times daily -  before meals and at bedtime. 120 tablet 3  . omeprazole (PRILOSEC) 40 MG capsule Take 1 tab every morning. 90 capsule 3  . Vitamin D, Ergocalciferol, (DRISDOL) 1.25 MG (50000 UT) CAPS capsule Take 1 capsule (50,000 Units total) by mouth every 7 (seven) days. 16 capsule 0  . Blood Glucose Monitoring Suppl (ACCU-CHEK AVIVA PLUS) w/Device KIT USE AS DIRECTED DAILY. E11.9 (Patient not taking: Reported on 08/17/2019) 1 kit 0  . Continuous Blood Gluc Receiver ([place]) [place] 1 each by Does not apply route as directed. (Patient not taking: Reported on 08/17/2019) 1 each 0  . Continuous Blood Gluc Sensor (DEXCOM G6 SENSOR) MISC 1 each by Does not apply route as directed. (Patient not taking: Reported on 08/17/2019) 3 each 11  . Continuous Blood Gluc Transmit (DEXCOM G6 TRANSMITTER) MISC 1 each by Does not apply route as directed. (Patient not taking: Reported on 08/17/2019) 1 each 3  . Lancet Devices (ACCU-CHEK SOFTCLIX) lancets Use as instructed daily. (Patient not taking: Reported on 08/17/2019) 1 each 5  . metFORMIN (GLUCOPHAGE) 500 MG tablet Take 1 tablet (500 mg total) by mouth 2 (two) times daily with a meal. (Patient not taking: Reported on 08/30/2019) 180 tablet 1  . nicotine (NICODERM CQ - DOSED IN MG/24 HOURS) 21 mg/24hr patch Place 21 mg onto the skin daily.    . predniSONE (DELTASONE) 20 MG tablet Take 1 tablet (20 mg total) by mouth daily with breakfast. (Patient not taking: Reported on 08/30/2019) 5 tablet 0   Current Facility-Administered Medications on File Prior to Visit  Medication Dose Route Frequency Provider Last Rate Last Dose  . 0.9 %  sodium chloride infusion  500 mL Intravenous Once Pyrtle, Lajuan Lines, MD        Observations/Objective: Awake,  alert, oriented x3 Not in acute distress  Lab Results  Component Value Date   HGBA1C 6.5 07/24/2019    Assessment and Plan: 1. Sciatica of right side She is willing to try gabapentin and has been advised to take it at night - Ambulatory referral to Physical Therapy - gabapentin (NEURONTIN) 300 MG capsule; Take 1 capsule (300 mg total) by mouth at bedtime.  Dispense: 30 capsule; Refill: 3  2. Type 2 diabetes mellitus with other neurologic complication, without long-term current use of insulin (HCC) Controlled with A1c of 6.5 Discontinue Metformin due to intolerance Commence Bydureon and she will use this along with Reglan for gastroparesis and also consider laxative, MiraLAX as needed Continue lifestyle modifications - Exenatide ER (BYDUREON) 2 MG PEN; 2 mg by Subconjunctival route once a week.  Dispense: 4 each;  Refill: 6  3. Impingement syndrome of shoulder region, unspecified laterality Improved - Ambulatory referral to Physical Therapy   Follow Up Instructions: Return for Medical conditions, keep previously scheduled appointment.    I discussed the assessment and treatment plan with the patient. The patient was provided an opportunity to ask questions and all were answered. The patient agreed with the plan and demonstrated an understanding of the instructions.   The patient was advised to call back or seek an in-person evaluation if the symptoms worsen or if the condition fails to improve as anticipated.     I provided 15 minutes total of non-face-to-face time during this encounter including median intraservice time, reviewing previous notes, labs, imaging, medications, management and patient verbalized understanding.     Charlott Rakes, MD, FAAFP. [facility] and [place], [place]   08/30/2019, 2:00 PM

## 2019-08-30 NOTE — Progress Notes (Signed)
Patient has been called and DOB has been verified. Patient has been screened and transferred to PCP to start phone visit.   Patient is having pain in left shoulder and right leg.  Patient wants to discuss metformin medication.

## 2019-08-31 ENCOUNTER — Encounter: Payer: Self-pay | Admitting: Family Medicine

## 2019-08-31 MED ORDER — GABAPENTIN 300 MG PO CAPS: 300.0000 mg | ORAL_CAPSULE | Freq: Every day | ORAL | 3 refills | Status: DC

## 2019-09-07 ENCOUNTER — Encounter: Payer: Self-pay | Admitting: Family Medicine

## 2019-09-08 ENCOUNTER — Telehealth: Payer: Self-pay | Admitting: Family Medicine

## 2019-09-08 MED ORDER — DEXCOM G6 RECEIVER DEVI: 1.0000 | 0 refills | Status: DC

## 2019-09-08 MED ORDER — DEXCOM G6 TRANSMITTER MISC: 1.0000 | 3 refills | Status: DC

## 2019-09-08 NOTE — Telephone Encounter (Signed)
Opal Sidles could you please assist with the transportation piece and Lurena Joiner with the Glucometer? Here's the patient's message. I appreciate it!  "Dr . Margarita Rana I do not have transportation to physical therapy during the week days. Do you know of anyplace that has evening or weekend hours?  Also I am still waiting for the glucose meter . There was one more part to the device that needed a prescription or something. The scripts are at [facility] on [address]. I believe the pharmacist is waiting for a response from your office". Thanks

## 2019-09-08 NOTE — Telephone Encounter (Signed)
Thank you :)

## 2019-09-08 NOTE — Telephone Encounter (Signed)
Pharmacy reported that they never received the receiver or transmit system, even though these were sent in. I have resent these and I have contacted the patient to let her know.

## 2019-09-08 NOTE — Addendum Note (Signed)
Addended by: Daisy Blossom, Annie Main L on: 09/08/2019 12:05 PM   Modules accepted: Orders

## 2019-09-22 ENCOUNTER — Other Ambulatory Visit: Payer: Self-pay | Admitting: Family Medicine

## 2019-09-22 DIAGNOSIS — F419 Anxiety disorder, unspecified: Secondary | ICD-10-CM

## 2019-09-22 DIAGNOSIS — F329 Major depressive disorder, single episode, unspecified: Secondary | ICD-10-CM

## 2019-09-22 DIAGNOSIS — F32A Anxiety disorder, unspecified: Secondary | ICD-10-CM

## 2019-10-13 ENCOUNTER — Encounter: Payer: Self-pay | Admitting: Family Medicine

## 2019-10-13 ENCOUNTER — Other Ambulatory Visit: Payer: Self-pay | Admitting: Family Medicine

## 2019-10-13 DIAGNOSIS — R232 Flushing: Secondary | ICD-10-CM

## 2019-10-25 ENCOUNTER — Encounter: Payer: Self-pay | Admitting: Family Medicine

## 2019-11-08 ENCOUNTER — Encounter: Payer: Self-pay | Admitting: Family Medicine

## 2019-11-08 ENCOUNTER — Ambulatory Visit: Payer: Medicare HMO | Attending: Family Medicine | Admitting: Family Medicine

## 2019-11-08 ENCOUNTER — Encounter: Payer: Self-pay | Admitting: Licensed Clinical Social Worker

## 2019-11-08 ENCOUNTER — Other Ambulatory Visit: Payer: Self-pay | Admitting: Family Medicine

## 2019-11-08 ENCOUNTER — Other Ambulatory Visit: Payer: Self-pay

## 2019-11-08 VITALS — BP 114/73 | HR 76 | Ht 63.5 in | Wt 193.0 lb

## 2019-11-08 DIAGNOSIS — M79651 Pain in right thigh: Secondary | ICD-10-CM

## 2019-11-08 DIAGNOSIS — L732 Hidradenitis suppurativa: Secondary | ICD-10-CM | POA: Diagnosis not present

## 2019-11-08 DIAGNOSIS — F329 Major depressive disorder, single episode, unspecified: Secondary | ICD-10-CM

## 2019-11-08 DIAGNOSIS — E1149 Type 2 diabetes mellitus with other diabetic neurological complication: Secondary | ICD-10-CM | POA: Diagnosis not present

## 2019-11-08 DIAGNOSIS — E569 Vitamin deficiency, unspecified: Secondary | ICD-10-CM

## 2019-11-08 DIAGNOSIS — F419 Anxiety disorder, unspecified: Secondary | ICD-10-CM

## 2019-11-08 DIAGNOSIS — E559 Vitamin D deficiency, unspecified: Secondary | ICD-10-CM | POA: Diagnosis not present

## 2019-11-08 DIAGNOSIS — R600 Localized edema: Secondary | ICD-10-CM

## 2019-11-08 DIAGNOSIS — E785 Hyperlipidemia, unspecified: Secondary | ICD-10-CM | POA: Diagnosis not present

## 2019-11-08 DIAGNOSIS — I10 Essential (primary) hypertension: Secondary | ICD-10-CM | POA: Diagnosis not present

## 2019-11-08 DIAGNOSIS — R69 Illness, unspecified: Secondary | ICD-10-CM | POA: Diagnosis not present

## 2019-11-08 DIAGNOSIS — M797 Fibromyalgia: Secondary | ICD-10-CM | POA: Diagnosis not present

## 2019-11-08 DIAGNOSIS — M79652 Pain in left thigh: Secondary | ICD-10-CM | POA: Diagnosis not present

## 2019-11-08 DIAGNOSIS — F32A Anxiety disorder, unspecified: Secondary | ICD-10-CM

## 2019-11-08 LAB — POCT GLYCOSYLATED HEMOGLOBIN (HGB A1C): HbA1c, POC (controlled diabetic range): 6.5 % (ref 0.0–7.0)

## 2019-11-08 LAB — GLUCOSE, POCT (MANUAL RESULT ENTRY): POC Glucose: 131 mg/dl — AB (ref 70–99)

## 2019-11-08 MED ORDER — BUPROPION HCL ER (SR) 150 MG PO TB12: 150.0000 mg | ORAL_TABLET | Freq: Two times a day (BID) | ORAL | 1 refills | Status: DC

## 2019-11-08 MED ORDER — METHOCARBAMOL 750 MG PO TABS: 750.0000 mg | ORAL_TABLET | Freq: Three times a day (TID) | ORAL | 1 refills | Status: DC | PRN

## 2019-11-08 MED ORDER — ATORVASTATIN CALCIUM 20 MG PO TABS: 20.0000 mg | ORAL_TABLET | Freq: Every day | ORAL | 1 refills | Status: DC

## 2019-11-08 MED ORDER — CHLORHEXIDINE GLUCONATE 4 % EX LIQD: Freq: Every day | CUTANEOUS | 2 refills | Status: DC | PRN

## 2019-11-08 MED ORDER — LISINOPRIL 5 MG PO TABS: 5.0000 mg | ORAL_TABLET | Freq: Every day | ORAL | 1 refills | Status: DC

## 2019-11-08 MED ORDER — BYDUREON 2 MG ~~LOC~~ PEN: 2.0000 mg | PEN_INJECTOR | SUBCUTANEOUS | 6 refills | Status: DC

## 2019-11-08 MED ORDER — AMLODIPINE BESYLATE 10 MG PO TABS: 10.0000 mg | ORAL_TABLET | Freq: Every day | ORAL | 1 refills | Status: DC

## 2019-11-08 MED ORDER — DULOXETINE HCL 60 MG PO CPEP: 60.0000 mg | ORAL_CAPSULE | Freq: Every day | ORAL | 3 refills | Status: DC

## 2019-11-08 NOTE — Progress Notes (Addendum)
MSW Intern met with patient to provide behavioral health resources per patient's request to re-initiate therapy services in the community. Patient reports she was seeing a therapist regularly for the past 2 years; however, the therapist recently relocated and terminated services.   MSW Intern provided patient with crisis resources in clinic and additionally sent patient a secure e-mail with additional therapy & psychiatry services through [facility] e-mail ([place], a list of private practice clinicians who accept Medicare, [facility], and [facility] contact information).   MSW Intern encouraged patient to schedule a follow up appointment with Christa See, LCSW or this MSW Intern in the mean time. No additional concerns noted.    Idamae Lusher MSW Intern 11/08/2019  12:53pm

## 2019-11-08 NOTE — Progress Notes (Signed)
Patient has bumps on back.

## 2019-11-08 NOTE — Progress Notes (Signed)
Established Patient Office Visit  Subjective:  Patient ID: Amber Rose, female    DOB: [birth year]  Age: 44 y.o. MRN: [id number]  CC:  Chief Complaint  Patient presents with   Diabetes   Rash    HPI Amber Rose is a 44 year old female with a history of hypertension, type 2 diabetes mellitus (A1c 6.5), GERD, depression, anxiety, fibromyalgia, and gastroparesis who presents today for a follow up on chronic conditions, as well as an acute complaint of a rash on her back.  Her hypertension is well controlled on the current regimen of amlodipine and lisinopril.  She is tolerating Bydureon well and her A1c today is 6.5.  She is taking Prilosec for GERD which does cause an improvement in symptoms.  The patient states that she is still experiencing mild constipation but she doesn't feel that she is properly digesting food.  Denies nausea, vomiting, and diarrhea.  Followed by [facility].  She is requesting a referral for a new therapist to help her anxiety and depression symptoms. The patient stopped taking her gabapentin because she didn't feel that it improved her fibromyalgia symptoms and it caused her to wake up feeling groggy.  She has tried yoga and stretching exercises at home to decrease the pain but she still rates the pain in her leg and hip at 7/10.  She also mentioned that the facial swelling that she has been experiencing is still present.  She believes that this symptom has flare ups that coincide with the severity of her hip pain and may be related to an autoimmune or allergic response.  She does have an eye appointment scheduled for 11/11/19.  Past Medical History:  Diagnosis Date   Allergy    Anxiety    Arthritis    Chronic UTI    Depression    Diabetes mellitus without complication (HCC)    Diverticulosis    Fibromyalgia    GERD (gastroesophageal reflux disease)    Hiatal hernia    Hyperlipidemia    Hypertension    Internal hemorrhoids     Meningitis, viral    Pancreatitis     Past Surgical History:  Procedure Laterality Date   APPENDECTOMY  1990   ceasarian  2002   CESAREAN SECTION     x1   CHOLECYSTECTOMY  2011   COLONOSCOPY     UPPER GASTROINTESTINAL ENDOSCOPY     URINARY SURGERY     urethra sling, removal, and then revision 2016 (4 surgeries)   WISDOM TOOTH EXTRACTION      Family History  Problem Relation Age of Onset   Cancer Mother        cervical, breast, lungm skin, bladder-ex smoker   Diabetes Father    Multiple sclerosis Paternal Grandmother    Allergic rhinitis Neg Hx    Angioedema Neg Hx    Asthma Neg Hx    Eczema Neg Hx    Urticaria Neg Hx    Colon cancer Neg Hx    Esophageal cancer Neg Hx    Liver disease Neg Hx    Pancreatic cancer Neg Hx    Prostate cancer Neg Hx    Rectal cancer Neg Hx    Stomach cancer Neg Hx     Social History   Socioeconomic History   Marital status: Single    Spouse name: Not on file   Number of children: 3   Years of education: Not on file   Highest education level: Not on file  Occupational History  Not on file  Tobacco Use   Smoking status: Current Every Day Smoker    Packs/day: 1.00    Years: 20.00    Pack years: 20.00    Types: Cigarettes   Smokeless tobacco: Never Used  Substance and Sexual Activity   Alcohol use: Yes    Comment: rarely   Drug use: Yes    Types: Marijuana    Comment: last smoker 11-14-17   Sexual activity: Never    Comment: pt on her mentral cycle now began 11-14-17  Other Topics Concern   Not on file  Social History Narrative   Lives home with adult niece.  Not working.  Disability pending.  Pt is single.  Education GED.  3 children.    Social Determinants of Health   Financial Resource Strain:    Difficulty of Paying Living Expenses: Not on file  Food Insecurity:    Worried About Charity fundraiser in the Last Year: Not on file   [employer] of Food in the Last Year: Not on file    Transportation Needs:    Lack of Transportation (Medical): Not on file   Lack of Transportation (Non-Medical): Not on file  Physical Activity:    Days of Exercise per Week: Not on file   Minutes of Exercise per Session: Not on file  Stress:    Feeling of Stress : Not on file  Social Connections:    Frequency of Communication with Friends and Family: Not on file   Frequency of Social Gatherings with Friends and Family: Not on file   Attends Religious Services: Not on file   Active Member of [employer] or Organizations: Not on file   Attends Archivist Meetings: Not on file   Marital Status: Not on file  Intimate Partner Violence:    Fear of Current or Ex-Partner: Not on file   Emotionally Abused: Not on file   Physically Abused: Not on file   Sexually Abused: Not on file    Outpatient Medications Prior to Visit  Medication Sig Dispense Refill   acyclovir (ZOVIRAX) 400 MG tablet TAKE 1 TABLET BY MOUTH TWICE A DAY 180 tablet 0   albuterol (PROAIR HFA) 108 (90 Base) MCG/ACT inhaler Inhale 2 puffs into the lungs every 4 (four) hours as needed for wheezing or shortness of breath. 3 Inhaler 0   hydrOXYzine (ATARAX/VISTARIL) 25 MG tablet TAKE 1 TABLET BY MOUTH THREE TIMES A DAY AS NEEDED 270 tablet 0   ibuprofen (ADVIL,MOTRIN) 600 MG tablet Take 600 mg by mouth every 6 (six) hours as needed.     omeprazole (PRILOSEC) 40 MG capsule Take 1 tab every morning. 90 capsule 3   Vitamin D, Ergocalciferol, (DRISDOL) 1.25 MG (50000 UT) CAPS capsule Take 1 capsule (50,000 Units total) by mouth every 7 (seven) days. 16 capsule 0   amLODipine (NORVASC) 10 MG tablet Take 1 tablet (10 mg total) by mouth daily. 90 tablet 0   atorvastatin (LIPITOR) 20 MG tablet Take 1 tablet (20 mg total) by mouth daily. 90 tablet 1   buPROPion (WELLBUTRIN SR) 150 MG 12 hr tablet Take 1 tablet (150 mg total) by mouth 2 (two) times daily. 10 tablet 1   Exenatide ER (BYDUREON) 2 MG PEN 2 mg by  Subconjunctival route once a week. 4 each 6   lisinopril (ZESTRIL) 5 MG tablet Take 1 tablet (5 mg total) by mouth daily. 90 tablet 1   methocarbamol (ROBAXIN) 750 MG tablet Take 1 tablet (750  mg total) by mouth every 8 (eight) hours as needed for muscle spasms. 180 tablet 0   Blood Glucose Monitoring Suppl (ACCU-CHEK AVIVA PLUS) w/Device KIT USE AS DIRECTED DAILY. E11.9 (Patient not taking: Reported on 08/17/2019) 1 kit 0   Continuous Blood Gluc Receiver ([employer]) DEVI 1 each by Does not apply route as directed. (Patient not taking: Reported on 11/08/2019) 1 each 0   Continuous Blood Gluc Sensor (DEXCOM G6 SENSOR) MISC 1 each by Does not apply route as directed. (Patient not taking: Reported on 08/17/2019) 3 each 11   Continuous Blood Gluc Transmit (DEXCOM G6 TRANSMITTER) MISC 1 each by Does not apply route as directed. (Patient not taking: Reported on 11/08/2019) 1 each 3   gabapentin (NEURONTIN) 300 MG capsule Take 1 capsule (300 mg total) by mouth at bedtime. (Patient not taking: Reported on 11/08/2019) 30 capsule 3   Lancet Devices (ACCU-CHEK SOFTCLIX) lancets Use as instructed daily. (Patient not taking: Reported on 08/17/2019) 1 each 5   metoCLOPramide (REGLAN) 5 MG tablet Take 1 tablet (5 mg total) by mouth 4 (four) times daily -  before meals and at bedtime. (Patient not taking: Reported on 11/08/2019) 120 tablet 3   nicotine (NICODERM CQ - DOSED IN MG/24 HOURS) 21 mg/24hr patch Place 21 mg onto the skin daily.     predniSONE (DELTASONE) 20 MG tablet Take 1 tablet (20 mg total) by mouth daily with breakfast. (Patient not taking: Reported on 08/30/2019) 5 tablet 0   metFORMIN (GLUCOPHAGE) 500 MG tablet Take 1 tablet (500 mg total) by mouth 2 (two) times daily with a meal. (Patient not taking: Reported on 08/30/2019) 180 tablet 1   Facility-Administered Medications Prior to Visit  Medication Dose Route Frequency Provider Last Rate Last Admin   0.9 %  sodium chloride infusion   500 mL Intravenous Once Pyrtle, Lajuan Lines, MD        Allergies  Allergen Reactions   Metformin And Related Nausea And Vomiting   Penicillins     childhood   Xanax [Alprazolam]     "Caused upper respiratory symptoms" per pt   Glyburide Other (See Comments)    "blood sugar dropped uncontrollably"   Linagliptin Diarrhea and Other (See Comments)    Stomach pain, sinus infection   Moxifloxacin Diarrhea    ROS Review of Systems  Constitutional: Negative for fatigue, fever and unexpected weight change.  HENT: Positive for facial swelling. Negative for congestion, rhinorrhea, sinus pressure and sinus pain.   Eyes: Negative for visual disturbance.  Respiratory: Negative for cough, chest tightness and shortness of breath.   Cardiovascular: Positive for leg swelling. Negative for chest pain and palpitations.  Gastrointestinal: Positive for constipation. Negative for abdominal distention, abdominal pain, diarrhea, nausea and vomiting.  Endocrine: Negative for polydipsia and polyuria.  Genitourinary: Negative for decreased urine volume, difficulty urinating and dysuria.  Musculoskeletal: Positive for arthralgias and myalgias.       Right lower extremity  Skin: Positive for rash. Negative for color change.  Neurological: Negative for tremors, weakness and numbness.  Hematological: Does not bruise/bleed easily.  Psychiatric/Behavioral: Negative for agitation and behavioral problems.      Objective:    Physical Exam  Constitutional: She is oriented to person, place, and time. She appears well-developed and well-nourished. No distress.  HENT:  Head: Normocephalic and atraumatic.  Eyes: Pupils are equal, round, and reactive to light. Conjunctivae and EOM are normal.  Bilateral periorbital edema  Cardiovascular: Normal rate, regular rhythm, normal heart sounds  and intact distal pulses.  No murmur heard. Pulmonary/Chest: Effort normal and breath sounds normal. No respiratory distress. She  has no wheezes.  Abdominal: Soft. Bowel sounds are normal. She exhibits no distension. There is no abdominal tenderness.  Musculoskeletal:        General: Edema present. Normal range of motion.     Cervical back: Normal range of motion and neck supple.     Comments: +1 BLE  Neurological: She is alert and oriented to person, place, and time.  Skin: Skin is warm and dry. Rash noted.     Psychiatric: She has a normal mood and affect. Her behavior is normal.  Nursing note and vitals reviewed.   BP 114/73    Pulse 76    Ht 5' 3.5" (1.613 m)    Wt 193 lb (87.5 kg)    SpO2 99%    BMI 33.65 kg/m     Health Maintenance Due  Topic Date Due   OPHTHALMOLOGY EXAM  04/27/2018   PAP SMEAR-Modifier  12/26/2019    There are no preventive care reminders to display for this patient.  Lab Results  Component Value Date   TSH 2.160 06/01/2019   Lab Results  Component Value Date   WBC 13.8 (H) 06/01/2019   HGB 11.8 06/01/2019   HCT 37.8 06/01/2019   MCV 83 06/01/2019   PLT 413 06/01/2019   Lab Results  Component Value Date   NA 138 06/01/2019   K 4.1 06/01/2019   CO2 20 06/01/2019   GLUCOSE 105 (H) 06/01/2019   BUN 10 06/01/2019   CREATININE 0.69 06/01/2019   BILITOT <0.2 06/01/2019   ALKPHOS 106 06/01/2019   AST 10 06/01/2019   ALT 10 06/01/2019   PROT 7.0 06/01/2019   ALBUMIN 4.1 06/01/2019   CALCIUM 9.6 06/01/2019   ANIONGAP 7 10/12/2016   Lab Results  Component Value Date   CHOL 188 06/01/2019   Lab Results  Component Value Date   HDL 48 06/01/2019   Lab Results  Component Value Date   LDLCALC 88 06/01/2019   Lab Results  Component Value Date   TRIG 259 (H) 06/01/2019   Lab Results  Component Value Date   CHOLHDL 3.9 06/01/2019   Lab Results  Component Value Date   HGBA1C 6.5 11/08/2019      Assessment & Plan:   1. Type 2 diabetes mellitus with other neurologic complication, without long-term current use of insulin (HCC) Controlled Counseled on  Diabetic diet, my plate method, 400 minutes of moderate intensity exercise/week Keep blood sugar logs with fasting goals of 80-120 mg/dl, random of less than 180 and in the event of sugars less than 60 mg/dl or greater than 400 mg/dl please notify the clinic ASAP. It is recommended that you undergo annual eye exams and annual foot exams. Keep eye exam appointment on 11/11/19 - Glucose (CBG) - HgB A1c - Complete Metabolic Panel with GFR - Lipid panel - Microalbumin/Creatinine Ratio, Urine - Exenatide ER (BYDUREON) 2 MG PEN; 2 mg by Subconjunctival route once a week.  Dispense: 4 each; Refill: 6  2. Facial edema Chronic - Brain natriuretic peptide - Ambulatory referral to Allergy  3. Hypertension, unspecified type Controlled Counseled on blood pressure goal of less than 130/80, low-sodium, DASH diet, medication compliance, 150 minutes of moderate intensity exercise per week. Discussed medication compliance, adverse effects. - amLODipine (NORVASC) 10 MG tablet; Take 1 tablet (10 mg total) by mouth daily.  Dispense: 90 tablet; Refill: 1 - lisinopril (  ZESTRIL) 5 MG tablet; Take 1 tablet (5 mg total) by mouth daily.  Dispense: 90 tablet; Refill: 1  4. Hyperlipidemia, unspecified hyperlipidemia type Stable Continue medication Advised a low cholesterol diet and 150 minutes per week of moderate physical activity - atorvastatin (LIPITOR) 20 MG tablet; Take 1 tablet (20 mg total) by mouth daily.  Dispense: 90 tablet; Refill: 1  5. Anxiety and depression Uncontrolled Therapy referral - buPROPion (WELLBUTRIN SR) 150 MG 12 hr tablet; Take 1 tablet (150 mg total) by mouth 2 (two) times daily.  Dispense: 180 tablet; Refill: 1  6. Pain in both thighs Uncontrolled Continue methocarbamol Start Cymbalta Encouraged exercises that she can complete at home, such as yoga and stretching exercises. - methocarbamol (ROBAXIN) 750 MG tablet; Take 1 tablet (750 mg total) by mouth every 8 (eight) hours as  needed for muscle spasms.  Dispense: 180 tablet; Refill: 1  7. Vitamin deficiency - VITAMIN D 25 Hydroxy (Vit-D Deficiency, Fractures)  8. Hidradenitis suppurativa Chronic - chlorhexidine (HIBICLENS) 4 % external liquid; Apply topically daily as needed.  Dispense: 120 mL; Refill: 2  9. Fibromyalgia See #6 - DULoxetine (CYMBALTA) 60 MG capsule; Take 1 capsule (60 mg total) by mouth daily.  Dispense: 30 capsule; Refill: 3   Meds ordered this encounter  Medications   chlorhexidine (HIBICLENS) 4 % external liquid    Sig: Apply topically daily as needed.    Dispense:  120 mL    Refill:  2   DULoxetine (CYMBALTA) 60 MG capsule    Sig: Take 1 capsule (60 mg total) by mouth daily.    Dispense:  30 capsule    Refill:  3   amLODipine (NORVASC) 10 MG tablet    Sig: Take 1 tablet (10 mg total) by mouth daily.    Dispense:  90 tablet    Refill:  1   atorvastatin (LIPITOR) 20 MG tablet    Sig: Take 1 tablet (20 mg total) by mouth daily.    Dispense:  90 tablet    Refill:  1   buPROPion (WELLBUTRIN SR) 150 MG 12 hr tablet    Sig: Take 1 tablet (150 mg total) by mouth 2 (two) times daily.    Dispense:  180 tablet    Refill:  1   Exenatide ER (BYDUREON) 2 MG PEN    Sig: 2 mg by Subconjunctival route once a week.    Dispense:  4 each    Refill:  6    Discontinue Metformin   lisinopril (ZESTRIL) 5 MG tablet    Sig: Take 1 tablet (5 mg total) by mouth daily.    Dispense:  90 tablet    Refill:  1   methocarbamol (ROBAXIN) 750 MG tablet    Sig: Take 1 tablet (750 mg total) by mouth every 8 (eight) hours as needed for muscle spasms.    Dispense:  180 tablet    Refill:  1    Follow-up: Return for [facility]  - referral for Psychotherapy; 3 months virtual with PCP.Marland Kitchen    Tomasita Morrow, RN

## 2019-11-09 LAB — CMP14+EGFR
ALT: 13 IU/L (ref 0–32)
AST: 12 IU/L (ref 0–40)
Albumin/Globulin Ratio: 1.5 (ref 1.2–2.2)
Albumin: 4 g/dL (ref 3.8–4.8)
Alkaline Phosphatase: 100 IU/L (ref 39–117)
BUN/Creatinine Ratio: 15 (ref 9–23)
BUN: 9 mg/dL (ref 6–24)
Bilirubin Total: <0.2 mg/dL (ref 0.0–1.2)
CO2: 24 mmol/L (ref 20–29)
Calcium: 9.4 mg/dL (ref 8.7–10.2)
Chloride: 103 mmol/L (ref 96–106)
Creatinine, Ser: 0.62 mg/dL (ref 0.57–1.00)
GFR calc Af Amer: 128 mL/min/{1.73_m2} (ref >59)
GFR calc non Af Amer: 111 mL/min/{1.73_m2} (ref >59)
Globulin, Total: 2.6 g/dL (ref 1.5–4.5)
Glucose: 110 mg/dL — ABNORMAL HIGH (ref 65–99)
Potassium: 4.8 mmol/L (ref 3.5–5.2)
Sodium: 140 mmol/L (ref 134–144)
Total Protein: 6.6 g/dL (ref 6.0–8.5)

## 2019-11-09 LAB — MICROALBUMIN / CREATININE URINE RATIO
Creatinine, Urine: 55.6 mg/dL
Microalb/Creat Ratio: <5 mg/g creat (ref 0–29)
Microalbumin, Urine: <3 ug/mL

## 2019-11-09 LAB — LIPID PANEL
Chol/HDL Ratio: 3.1 ratio (ref 0.0–4.4)
Cholesterol, Total: 179 mg/dL (ref 100–199)
HDL: 58 mg/dL (ref >39)
LDL Chol Calc (NIH): 107 mg/dL — ABNORMAL HIGH (ref 0–99)
Triglycerides: 76 mg/dL (ref 0–149)
VLDL Cholesterol Cal: 14 mg/dL (ref 5–40)

## 2019-11-09 LAB — BRAIN NATRIURETIC PEPTIDE: BNP: 25.4 pg/mL (ref 0.0–100.0)

## 2019-11-09 LAB — VITAMIN D 25 HYDROXY (VIT D DEFICIENCY, FRACTURES): Vit D, 25-Hydroxy: 21.9 ng/mL — ABNORMAL LOW (ref 30.0–100.0)

## 2019-11-10 ENCOUNTER — Other Ambulatory Visit: Payer: Self-pay | Admitting: Family Medicine

## 2019-11-10 DIAGNOSIS — E559 Vitamin D deficiency, unspecified: Secondary | ICD-10-CM

## 2019-11-10 MED ORDER — VITAMIN D (ERGOCALCIFEROL) 1.25 MG (50000 UNIT) PO CAPS: 50000.0000 [IU] | ORAL_CAPSULE | ORAL | 0 refills | Status: DC

## 2019-11-16 ENCOUNTER — Ambulatory Visit: Payer: Self-pay | Admitting: Allergy & Immunology

## 2019-11-16 DIAGNOSIS — H40053 Ocular hypertension, bilateral: Secondary | ICD-10-CM | POA: Diagnosis not present

## 2019-11-16 DIAGNOSIS — E119 Type 2 diabetes mellitus without complications: Secondary | ICD-10-CM | POA: Diagnosis not present

## 2019-11-16 DIAGNOSIS — H10413 Chronic giant papillary conjunctivitis, bilateral: Secondary | ICD-10-CM | POA: Diagnosis not present

## 2019-11-16 DIAGNOSIS — H04123 Dry eye syndrome of bilateral lacrimal glands: Secondary | ICD-10-CM | POA: Diagnosis not present

## 2019-11-16 LAB — HM DIABETES EYE EXAM

## 2019-11-22 ENCOUNTER — Encounter: Payer: Self-pay | Admitting: Family Medicine

## 2019-11-23 ENCOUNTER — Other Ambulatory Visit: Payer: Self-pay | Admitting: Family Medicine

## 2019-11-23 MED ORDER — DOXYCYCLINE HYCLATE 100 MG PO TABS: 100.0000 mg | ORAL_TABLET | Freq: Two times a day (BID) | ORAL | 0 refills | Status: DC

## 2019-11-28 ENCOUNTER — Ambulatory Visit: Payer: Self-pay | Admitting: Allergy & Immunology

## 2019-12-01 ENCOUNTER — Other Ambulatory Visit: Payer: Self-pay | Admitting: Family Medicine

## 2019-12-01 ENCOUNTER — Encounter: Payer: Self-pay | Admitting: Family Medicine

## 2019-12-01 DIAGNOSIS — Z20828 Contact with and (suspected) exposure to other viral communicable diseases: Secondary | ICD-10-CM | POA: Diagnosis not present

## 2019-12-01 MED ORDER — FLUCONAZOLE 150 MG PO TABS: 150.0000 mg | ORAL_TABLET | Freq: Once | ORAL | 2 refills | Status: AC

## 2019-12-03 ENCOUNTER — Other Ambulatory Visit: Payer: Self-pay | Admitting: Family Medicine

## 2019-12-03 DIAGNOSIS — M797 Fibromyalgia: Secondary | ICD-10-CM

## 2019-12-05 ENCOUNTER — Other Ambulatory Visit: Payer: Self-pay

## 2019-12-05 ENCOUNTER — Encounter: Payer: Self-pay | Admitting: Allergy & Immunology

## 2019-12-05 ENCOUNTER — Ambulatory Visit (INDEPENDENT_AMBULATORY_CARE_PROVIDER_SITE_OTHER): Payer: Medicare HMO | Admitting: Allergy & Immunology

## 2019-12-05 DIAGNOSIS — T783XXD Angioneurotic edema, subsequent encounter: Secondary | ICD-10-CM

## 2019-12-05 DIAGNOSIS — L501 Idiopathic urticaria: Secondary | ICD-10-CM | POA: Diagnosis not present

## 2019-12-05 NOTE — Progress Notes (Signed)
RE: Amber Rose MRN: [id number] DOB: [birth year] Date of Telemedicine Visit: 12/05/2019  Referring provider: Charlott Rakes, MD Primary care provider: Charlott Rakes, MD  Chief Complaint: Angioedema (facial and leg swelling )   Telemedicine Follow Up Visit via Telephone: I connected with [name] for a follow up on 12/05/19 by telephone and verified that I am speaking with the correct person using two identifiers.   I discussed the limitations, risks, security and privacy concerns of performing an evaluation and management service by telephone and the availability of in person appointments. I also discussed with the patient that there may be a patient responsible charge related to this service. The patient expressed understanding and agreed to proceed.  Patient is at home. .  Provider is at the office.  Visit start time: 10:47 AM Visit end time: 11:16 AM Insurance consent/check in by: Mel Almond Medical consent and medical assistant/nurse: Garlon Hatchet  History of Present Illness:  She is a 44 y.o. female, who is being followed for idiopathic urticaria with angioedema. Her previous allergy office visit was in February 2019 with myself. At that time, we continued her cetirizine 51m BID and finally convinced her to start Xolair. We obtained a serum tryptase to rule out mast cell disease and some other labs to rule out IgG4 related disease (IgG subclasses all normal). 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   Review of her labs: She had a negative ANA and thyroid studies (May 2018). She had a negative lupus anticoagulant, thyroid studies, and even ANCA panel and ACE level. She did have a positive ANA in the distant past in June 2015, but it was negative last time that we saw her. 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 She had a negative Factor XII mutation analysis in March 2019. We did a workup for dermatomyositis, including a CK, which was negative. Due to the history of CREST syndrome (Rheuamtology never confirmed this), we did a Sjogren's syndrome antibody and anti-Jo antibody; these were negative. Earlier in her workup, labs for HAE were all normal. Environmental allergy and food allergy panels in February 2018 were negative.   We did an immune screen including antibody levels and B cell markers; these were normal. She had an excellent response to Pneumovax, going from 3/23 to 16/23 serotypes of Pneumococcus. T cell numbers were excellent as well.   Since the last visit, she has mostly done well. [place] grandmother who passed at 923 Her daughter [place] in [place] 2 years. She is coming home in March. She did great the first year pre COVID, but she has been doing all remote since COVID happened. She turned 18 and has one more year of school due to the timing of the year. She is unsure whether she will Zoom with the school [place]   Ms. BDingwallcontinues to have swelling. It is still in the face but it is also occurring in the legs. She has been having pressure from the swelling and it is affecting her vision. She also gets a headache and feels like she was "hit by a truck" after these episodes.   She has followed up with her PCP (Dr. NMargarita Rana and she had a "mild  attack" when she saw her. She has seen her eye doctor who recommended referral to a tertiary care center. Apparently the thought of an adrenal gland insufficiency was discussed. 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 She is on doxycycline. She never did feel that the Hoffman worked (trialed for 3 months).   Otherwise, there have been no changes to her past medical history, surgical history, family history, or social history.  Assessment  and Plan:  [name] is a 44 y.o. female with:   Idiopathic angioedema with urticaria  Hidradenitis  Periorbital edema   We are going to work on referral to a tertiary care center. Unfortunately, she was uninsured when we attempted to send to [facility] or [facility]. However, how she has Medicare in place so this should not be an issue. She does not need refills on anything at this point. She is using hydroxyzine at night, which provides some improvement of her symptoms.   Diagnostics: None.  Medication List:  Current Outpatient Medications  Medication Sig Dispense Refill  . acyclovir (ZOVIRAX) 400 MG tablet TAKE 1 TABLET BY MOUTH TWICE A DAY 180 tablet 0  . albuterol (PROAIR HFA) 108 (90 Base) MCG/ACT inhaler Inhale 2 puffs into the lungs every 4 (four) hours as needed for wheezing or shortness of breath. 3 Inhaler 0  . amLODipine (NORVASC) 10 MG tablet Take 1 tablet (10 mg total) by mouth daily. 90 tablet 1  . atorvastatin (LIPITOR) 20 MG tablet Take 1 tablet (20 mg total) by mouth daily. 90 tablet 1  . Blood Glucose Monitoring Suppl (ACCU-CHEK AVIVA PLUS) w/Device KIT USE AS DIRECTED DAILY. E11.9 1 kit 0  . doxycycline (VIBRA-TABS) 100 MG tablet Take 1 tablet (100 mg total) by mouth 2 (two) times daily. 20 tablet 0  . DULoxetine (CYMBALTA) 60 MG capsule Take 1 capsule (60 mg total) by mouth daily. 30 capsule 3  . Exenatide ER (BYDUREON) 2 MG PEN 2 mg by Subconjunctival route once a week. 4 each 6  . hydrOXYzine (ATARAX/VISTARIL) 25 MG tablet TAKE 1 TABLET BY MOUTH THREE TIMES A DAY AS NEEDED 270 tablet 0  . ibuprofen (ADVIL,MOTRIN) 600 MG tablet Take 600 mg by mouth every 6 (six) hours as needed.    [name] Devices (ACCU-CHEK SOFTCLIX) lancets Use as instructed daily. 1 each 5  . lisinopril (ZESTRIL) 5 MG tablet Take 1 tablet (5 mg total) by mouth daily. 90 tablet 1  . methocarbamol (ROBAXIN) 750 MG tablet Take 1 tablet (750 mg total) by mouth every 8 (eight) hours as needed for muscle  spasms. 180 tablet 1  . Multiple Vitamin (MULTI-VITAMIN DAILY) TABS Take 1 tablet by mouth daily as needed.    Marland Kitchen omeprazole (PRILOSEC) 40 MG capsule Take 1 tab every morning. 90 capsule 3  . Vitamin D, Ergocalciferol, (DRISDOL) 1.25 MG (50000 UNIT) CAPS capsule Take 1 capsule (50,000 Units total) by mouth every 7 (seven) days. 12 capsule 0  . buPROPion (WELLBUTRIN SR) 150 MG 12 hr tablet Take 1 tablet (150 mg total) by mouth 2 (two) times daily. 180 tablet 1  . chlorhexidine (HIBICLENS) 4 % external liquid Apply topically daily as needed. 120 mL 2  . Continuous Blood Gluc Receiver ([place]) [place] 1 each by Does not apply route as directed. (Patient not taking: Reported on 11/08/2019) 1 each 0  . Continuous Blood Gluc Sensor (DEXCOM G6 SENSOR) MISC 1 each by Does not apply route as directed. (Patient not taking: Reported on 08/17/2019) 3 each  11  . Continuous Blood Gluc Transmit (DEXCOM G6 TRANSMITTER) MISC 1 each by Does not apply route as directed. (Patient not taking: Reported on 11/08/2019) 1 each 3  . nicotine (NICODERM CQ - DOSED IN MG/24 HOURS) 21 mg/24hr patch Place 21 mg onto the skin daily.    . predniSONE (DELTASONE) 20 MG tablet Take 1 tablet (20 mg total) by mouth daily with breakfast. (Patient not taking: Reported on 08/30/2019) 5 tablet 0   Current Facility-Administered Medications  Medication Dose Route Frequency Provider Last Rate Last Admin  . 0.9 %  sodium chloride infusion  500 mL Intravenous Once Pyrtle, Lajuan Lines, MD       Allergies: Allergies  Allergen Reactions  . Metformin And Related Nausea And Vomiting  . Metronidazole Nausea And Vomiting  . Penicillins     childhood  . Xanax [Alprazolam]     "Caused upper respiratory symptoms" per pt  . Glyburide Other (See Comments)    "blood sugar dropped uncontrollably"  . Linagliptin Diarrhea and Other (See Comments)    Stomach pain, sinus infection  . Moxifloxacin Diarrhea   I reviewed her past medical history, social  history, family history, and environmental history and no significant changes have been reported from previous visits.  Review of Systems  Constitutional: Positive for activity change and fatigue. Negative for appetite change, chills, diaphoresis and fever.  HENT: Negative for congestion, postnasal drip, rhinorrhea, sinus pressure and sore throat.   Eyes: Positive for visual disturbance. Negative for pain, discharge, redness and itching.  Respiratory: Negative for shortness of breath, wheezing and stridor.   Gastrointestinal: Negative for diarrhea, nausea and vomiting.  Endocrine: Negative for cold intolerance and heat intolerance.  Musculoskeletal: Negative for arthralgias, joint swelling and myalgias.  Skin: Negative for rash.  Allergic/Immunologic: Negative for environmental allergies and food allergies.  All other systems reviewed and are negative.    Objective:  Physical exam not obtained as encounter was done via telephone.   Previous notes and tests were reviewed.  I discussed the assessment and treatment plan with the patient. The patient was provided an opportunity to ask questions and all were answered. The patient agreed with the plan and demonstrated an understanding of the instructions.   The patient was advised to call back or seek an in-person evaluation if the symptoms worsen or if the condition fails to improve as anticipated.  I provided 29 minutes of non-face-to-face time during this encounter.  It was my pleasure to participate in [place] care today. Please feel free to contact me with any questions or concerns.   Sincerely,  Valentina Shaggy, MD

## 2019-12-06 ENCOUNTER — Encounter: Payer: Self-pay | Admitting: Allergy & Immunology

## 2019-12-07 ENCOUNTER — Telehealth: Payer: Self-pay

## 2019-12-07 NOTE — Telephone Encounter (Signed)
Referrals have been placed to both [facility] and [facility]. They will call the patient for scheduling due to their policy of calling the patient themselves to schedule.. I will let her figure out which one can get her in sooner once they contact her.

## 2019-12-07 NOTE — Telephone Encounter (Signed)
An excellent idea!  Thank you for taking care of that.  Salvatore Marvel, MD [facility]

## 2019-12-21 ENCOUNTER — Other Ambulatory Visit: Payer: Self-pay | Admitting: Family Medicine

## 2019-12-21 DIAGNOSIS — F329 Major depressive disorder, single episode, unspecified: Secondary | ICD-10-CM

## 2019-12-21 DIAGNOSIS — F419 Anxiety disorder, unspecified: Secondary | ICD-10-CM

## 2019-12-21 DIAGNOSIS — F32A Depression, unspecified: Secondary | ICD-10-CM

## 2019-12-26 ENCOUNTER — Encounter: Payer: Self-pay | Admitting: Family Medicine

## 2019-12-26 DIAGNOSIS — M546 Pain in thoracic spine: Secondary | ICD-10-CM | POA: Diagnosis not present

## 2019-12-26 DIAGNOSIS — M5417 Radiculopathy, lumbosacral region: Secondary | ICD-10-CM | POA: Diagnosis not present

## 2019-12-26 DIAGNOSIS — M62830 Muscle spasm of back: Secondary | ICD-10-CM | POA: Diagnosis not present

## 2019-12-26 DIAGNOSIS — M545 Low back pain: Secondary | ICD-10-CM | POA: Diagnosis not present

## 2019-12-26 DIAGNOSIS — M5414 Radiculopathy, thoracic region: Secondary | ICD-10-CM | POA: Diagnosis not present

## 2019-12-26 DIAGNOSIS — M5413 Radiculopathy, cervicothoracic region: Secondary | ICD-10-CM | POA: Diagnosis not present

## 2019-12-26 DIAGNOSIS — M5412 Radiculopathy, cervical region: Secondary | ICD-10-CM | POA: Diagnosis not present

## 2019-12-26 DIAGNOSIS — G479 Sleep disorder, unspecified: Secondary | ICD-10-CM | POA: Diagnosis not present

## 2019-12-26 DIAGNOSIS — M5415 Radiculopathy, thoracolumbar region: Secondary | ICD-10-CM | POA: Diagnosis not present

## 2019-12-26 DIAGNOSIS — M7912 Myalgia of auxiliary muscles, head and neck: Secondary | ICD-10-CM | POA: Diagnosis not present

## 2019-12-29 DIAGNOSIS — M5417 Radiculopathy, lumbosacral region: Secondary | ICD-10-CM | POA: Diagnosis not present

## 2019-12-29 DIAGNOSIS — M5415 Radiculopathy, thoracolumbar region: Secondary | ICD-10-CM | POA: Diagnosis not present

## 2019-12-29 DIAGNOSIS — M62830 Muscle spasm of back: Secondary | ICD-10-CM | POA: Diagnosis not present

## 2019-12-29 DIAGNOSIS — G479 Sleep disorder, unspecified: Secondary | ICD-10-CM | POA: Diagnosis not present

## 2019-12-29 DIAGNOSIS — M7912 Myalgia of auxiliary muscles, head and neck: Secondary | ICD-10-CM | POA: Diagnosis not present

## 2019-12-29 DIAGNOSIS — M5412 Radiculopathy, cervical region: Secondary | ICD-10-CM | POA: Diagnosis not present

## 2019-12-29 DIAGNOSIS — M546 Pain in thoracic spine: Secondary | ICD-10-CM | POA: Diagnosis not present

## 2019-12-29 DIAGNOSIS — M5413 Radiculopathy, cervicothoracic region: Secondary | ICD-10-CM | POA: Diagnosis not present

## 2019-12-29 DIAGNOSIS — M5414 Radiculopathy, thoracic region: Secondary | ICD-10-CM | POA: Diagnosis not present

## 2019-12-29 DIAGNOSIS — M545 Low back pain: Secondary | ICD-10-CM | POA: Diagnosis not present

## 2020-01-01 DIAGNOSIS — M5417 Radiculopathy, lumbosacral region: Secondary | ICD-10-CM | POA: Diagnosis not present

## 2020-01-01 DIAGNOSIS — M546 Pain in thoracic spine: Secondary | ICD-10-CM | POA: Diagnosis not present

## 2020-01-01 DIAGNOSIS — M7912 Myalgia of auxiliary muscles, head and neck: Secondary | ICD-10-CM | POA: Diagnosis not present

## 2020-01-01 DIAGNOSIS — M545 Low back pain: Secondary | ICD-10-CM | POA: Diagnosis not present

## 2020-01-01 DIAGNOSIS — M5414 Radiculopathy, thoracic region: Secondary | ICD-10-CM | POA: Diagnosis not present

## 2020-01-01 DIAGNOSIS — M5412 Radiculopathy, cervical region: Secondary | ICD-10-CM | POA: Diagnosis not present

## 2020-01-01 DIAGNOSIS — G479 Sleep disorder, unspecified: Secondary | ICD-10-CM | POA: Diagnosis not present

## 2020-01-01 DIAGNOSIS — M5415 Radiculopathy, thoracolumbar region: Secondary | ICD-10-CM | POA: Diagnosis not present

## 2020-01-01 DIAGNOSIS — M62830 Muscle spasm of back: Secondary | ICD-10-CM | POA: Diagnosis not present

## 2020-01-01 DIAGNOSIS — M5413 Radiculopathy, cervicothoracic region: Secondary | ICD-10-CM | POA: Diagnosis not present

## 2020-01-04 ENCOUNTER — Ambulatory Visit: Payer: Medicare HMO | Attending: Family Medicine | Admitting: Family Medicine

## 2020-01-04 ENCOUNTER — Other Ambulatory Visit: Payer: Self-pay

## 2020-01-04 ENCOUNTER — Encounter: Payer: Self-pay | Admitting: Family Medicine

## 2020-01-04 DIAGNOSIS — G8929 Other chronic pain: Secondary | ICD-10-CM

## 2020-01-04 DIAGNOSIS — R933 Abnormal findings on diagnostic imaging of other parts of digestive tract: Secondary | ICD-10-CM | POA: Diagnosis not present

## 2020-01-04 DIAGNOSIS — Z712 Person consulting for explanation of examination or test findings: Secondary | ICD-10-CM

## 2020-01-04 NOTE — Progress Notes (Signed)
Patient has been called and DOB has been verified. Patient has been screened and transferred to PCP to start phone visit.     

## 2020-01-04 NOTE — Progress Notes (Signed)
Virtual Visit via Telephone Note  I connected with Amber Rose, on 01/04/2020 at 12:06 PM by telephone due to the COVID-19 pandemic and verified that I am speaking with the correct person using two identifiers.   Consent: I discussed the limitations, risks, security and privacy concerns of performing an evaluation and management service by telephone and the availability of in person appointments. I also discussed with the patient that there may be a patient responsible charge related to this service. The patient expressed understanding and agreed to proceed.   Location of Patient: Home  Location of Provider: Clinic   Persons participating in Telemedicine visit: Lorielle Boehning Farrington-CMA Dr. Margarita Rana     History of Present Illness: Amber Rose a 44 year old female with type 2 diabetes mellitus (A1c 6.5), hypertension, depression and anxiety, hydradenitis, tobacco abuse, fibromyalgia here for follow-up visit.  She was seen by her chiropractor and underwent an x-ray which revealed right side was suspicious for kidney stone; radiology report is not available to me at this time. She has chronic pain and so is unable to tell if she has right flank pain but she has no urinary symptoms, no hematuria, no fever.  For her angioedema, she was seen by allergy and immunology and is in the process of being referred to [facility].  Past Medical History:  Diagnosis Date  . Allergy   . Anxiety   . Arthritis   . Chronic UTI   . Depression   . Diabetes mellitus without complication ([place])   . Diverticulosis   . Fibromyalgia   . GERD (gastroesophageal reflux disease)   . Hiatal hernia   . Hyperlipidemia   . Hypertension   . Internal hemorrhoids   . Meningitis, viral   . Pancreatitis    Allergies  Allergen Reactions  . Metformin And Related Nausea And Vomiting  . Metronidazole Nausea And Vomiting  . Penicillins     childhood  . Xanax [Alprazolam]     "Caused  upper respiratory symptoms" per pt  . Glyburide Other (See Comments)    "blood sugar dropped uncontrollably"  . Linagliptin Diarrhea and Other (See Comments)    Stomach pain, sinus infection  . Moxifloxacin Diarrhea    Current Outpatient Medications on File Prior to Visit  Medication Sig Dispense Refill  . acyclovir (ZOVIRAX) 400 MG tablet TAKE 1 TABLET BY MOUTH TWICE A DAY 180 tablet 0  . albuterol (PROAIR HFA) 108 (90 Base) MCG/ACT inhaler Inhale 2 puffs into the lungs every 4 (four) hours as needed for wheezing or shortness of breath. 3 Inhaler 0  . amLODipine (NORVASC) 10 MG tablet Take 1 tablet (10 mg total) by mouth daily. 90 tablet 1  . atorvastatin (LIPITOR) 20 MG tablet Take 1 tablet (20 mg total) by mouth daily. 90 tablet 1  . Blood Glucose Monitoring Suppl (ACCU-CHEK AVIVA PLUS) w/Device KIT USE AS DIRECTED DAILY. E11.9 1 kit 0  . buPROPion (WELLBUTRIN SR) 150 MG 12 hr tablet Take 1 tablet (150 mg total) by mouth 2 (two) times daily. 180 tablet 1  . chlorhexidine (HIBICLENS) 4 % external liquid Apply topically daily as needed. 120 mL 2  . doxycycline (VIBRA-TABS) 100 MG tablet Take 1 tablet (100 mg total) by mouth 2 (two) times daily. 20 tablet 0  . DULoxetine (CYMBALTA) 60 MG capsule TAKE 1 CAPSULE BY MOUTH EVERY DAY 90 capsule 0  . Exenatide ER (BYDUREON) 2 MG PEN 2 mg by Subconjunctival route once a week. 4 each 6  . hydrOXYzine (  ATARAX/VISTARIL) 25 MG tablet TAKE 1 TABLET BY MOUTH THREE TIMES A DAY AS NEEDED 270 tablet 0  . ibuprofen (ADVIL,MOTRIN) 600 MG tablet Take 600 mg by mouth every 6 (six) hours as needed.    [name] Devices (ACCU-CHEK SOFTCLIX) lancets Use as instructed daily. 1 each 5  . lisinopril (ZESTRIL) 5 MG tablet Take 1 tablet (5 mg total) by mouth daily. 90 tablet 1  . methocarbamol (ROBAXIN) 750 MG tablet Take 1 tablet (750 mg total) by mouth every 8 (eight) hours as needed for muscle spasms. 180 tablet 1  . Multiple Vitamin (MULTI-VITAMIN DAILY) TABS Take 1  tablet by mouth daily as needed.    . nicotine (NICODERM CQ - DOSED IN MG/24 HOURS) 21 mg/24hr patch Place 21 mg onto the skin daily.    Marland Kitchen omeprazole (PRILOSEC) 40 MG capsule Take 1 tab every morning. 90 capsule 3  . Vitamin D, Ergocalciferol, (DRISDOL) 1.25 MG (50000 UNIT) CAPS capsule Take 1 capsule (50,000 Units total) by mouth every 7 (seven) days. 12 capsule 0  . Continuous Blood Gluc Receiver ([place]) [place] 1 each by Does not apply route as directed. (Patient not taking: Reported on 11/08/2019) 1 each 0  . Continuous Blood Gluc Sensor (DEXCOM G6 SENSOR) MISC 1 each by Does not apply route as directed. (Patient not taking: Reported on 08/17/2019) 3 each 11  . Continuous Blood Gluc Transmit (DEXCOM G6 TRANSMITTER) MISC 1 each by Does not apply route as directed. (Patient not taking: Reported on 11/08/2019) 1 each 3  . predniSONE (DELTASONE) 20 MG tablet Take 1 tablet (20 mg total) by mouth daily with breakfast. (Patient not taking: Reported on 08/30/2019) 5 tablet 0   Current Facility-Administered Medications on File Prior to Visit  Medication Dose Route Frequency Provider Last Rate Last Admin  . 0.9 %  sodium chloride infusion  500 mL Intravenous Once Pyrtle, Lajuan Lines, MD        Observations/Objective: Alert, awake, oriented x3 Not in acute distress  Assessment and Plan: 1. Abnormal findings on diagnostic imaging of other parts of digestive tract X-ray ordered by chiropractor revealed findings suspicious for renal calculi She is asymptomatic with regards to the renal calculi We will proceed with ordering CT renal stone study - CT RENAL STONE STUDY; Future   Follow Up Instructions: For chronic medical conditions keep previously scheduled appointment   I discussed the assessment and treatment plan with the patient. The patient was provided an opportunity to ask questions and all were answered. The patient agreed with the plan and demonstrated an understanding of the  instructions.   The patient was advised to call back or seek an in-person evaluation if the symptoms worsen or if the condition fails to improve as anticipated.     I provided 9 minutes total of non-face-to-face time during this encounter including median intraservice time, reviewing previous notes, investigations, ordering medications, medical decision making, coordinating care and patient verbalized understanding at the end of the visit.     Charlott Rakes, MD, FAAFP. [facility] and [place], [place]   01/04/2020, 12:06 PM

## 2020-01-05 ENCOUNTER — Encounter (INDEPENDENT_AMBULATORY_CARE_PROVIDER_SITE_OTHER): Payer: Self-pay

## 2020-01-05 DIAGNOSIS — G479 Sleep disorder, unspecified: Secondary | ICD-10-CM | POA: Diagnosis not present

## 2020-01-05 DIAGNOSIS — M546 Pain in thoracic spine: Secondary | ICD-10-CM | POA: Diagnosis not present

## 2020-01-05 DIAGNOSIS — M5414 Radiculopathy, thoracic region: Secondary | ICD-10-CM | POA: Diagnosis not present

## 2020-01-05 DIAGNOSIS — M5417 Radiculopathy, lumbosacral region: Secondary | ICD-10-CM | POA: Diagnosis not present

## 2020-01-05 DIAGNOSIS — M5415 Radiculopathy, thoracolumbar region: Secondary | ICD-10-CM | POA: Diagnosis not present

## 2020-01-05 DIAGNOSIS — M7912 Myalgia of auxiliary muscles, head and neck: Secondary | ICD-10-CM | POA: Diagnosis not present

## 2020-01-05 DIAGNOSIS — M62830 Muscle spasm of back: Secondary | ICD-10-CM | POA: Diagnosis not present

## 2020-01-05 DIAGNOSIS — M545 Low back pain: Secondary | ICD-10-CM | POA: Diagnosis not present

## 2020-01-05 DIAGNOSIS — M5413 Radiculopathy, cervicothoracic region: Secondary | ICD-10-CM | POA: Diagnosis not present

## 2020-01-05 DIAGNOSIS — M5412 Radiculopathy, cervical region: Secondary | ICD-10-CM | POA: Diagnosis not present

## 2020-01-10 ENCOUNTER — Other Ambulatory Visit: Payer: Self-pay

## 2020-01-11 ENCOUNTER — Other Ambulatory Visit: Payer: Self-pay

## 2020-01-11 ENCOUNTER — Ambulatory Visit (HOSPITAL_COMMUNITY)
Admission: RE | Admit: 2020-01-11 | Discharge: 2020-01-11 | Disposition: A | Discharge status: Home / Self Care | Payer: Medicare HMO | Source: Ambulatory Visit | Attending: Family Medicine | Admitting: Family Medicine

## 2020-01-11 DIAGNOSIS — R933 Abnormal findings on diagnostic imaging of other parts of digestive tract: Secondary | ICD-10-CM | POA: Insufficient documentation

## 2020-01-11 DIAGNOSIS — R319 Hematuria, unspecified: Secondary | ICD-10-CM | POA: Diagnosis not present

## 2020-01-16 DIAGNOSIS — M2550 Pain in unspecified joint: Secondary | ICD-10-CM | POA: Diagnosis not present

## 2020-01-16 DIAGNOSIS — R600 Localized edema: Secondary | ICD-10-CM | POA: Diagnosis not present

## 2020-01-22 DIAGNOSIS — M545 Low back pain: Secondary | ICD-10-CM | POA: Diagnosis not present

## 2020-01-22 DIAGNOSIS — M5415 Radiculopathy, thoracolumbar region: Secondary | ICD-10-CM | POA: Diagnosis not present

## 2020-01-22 DIAGNOSIS — M5413 Radiculopathy, cervicothoracic region: Secondary | ICD-10-CM | POA: Diagnosis not present

## 2020-01-22 DIAGNOSIS — M7912 Myalgia of auxiliary muscles, head and neck: Secondary | ICD-10-CM | POA: Diagnosis not present

## 2020-01-22 DIAGNOSIS — M5412 Radiculopathy, cervical region: Secondary | ICD-10-CM | POA: Diagnosis not present

## 2020-01-22 DIAGNOSIS — M546 Pain in thoracic spine: Secondary | ICD-10-CM | POA: Diagnosis not present

## 2020-01-22 DIAGNOSIS — M5414 Radiculopathy, thoracic region: Secondary | ICD-10-CM | POA: Diagnosis not present

## 2020-01-22 DIAGNOSIS — G479 Sleep disorder, unspecified: Secondary | ICD-10-CM | POA: Diagnosis not present

## 2020-01-22 DIAGNOSIS — M5417 Radiculopathy, lumbosacral region: Secondary | ICD-10-CM | POA: Diagnosis not present

## 2020-01-22 DIAGNOSIS — M62830 Muscle spasm of back: Secondary | ICD-10-CM | POA: Diagnosis not present

## 2020-01-23 ENCOUNTER — Encounter: Payer: Self-pay | Admitting: Family Medicine

## 2020-01-26 ENCOUNTER — Other Ambulatory Visit: Payer: Self-pay | Admitting: Family Medicine

## 2020-01-26 DIAGNOSIS — E559 Vitamin D deficiency, unspecified: Secondary | ICD-10-CM

## 2020-01-29 DIAGNOSIS — Z20828 Contact with and (suspected) exposure to other viral communicable diseases: Secondary | ICD-10-CM | POA: Diagnosis not present

## 2020-01-30 ENCOUNTER — Other Ambulatory Visit: Payer: Self-pay | Admitting: Family Medicine

## 2020-01-30 DIAGNOSIS — E559 Vitamin D deficiency, unspecified: Secondary | ICD-10-CM

## 2020-02-02 DIAGNOSIS — M62830 Muscle spasm of back: Secondary | ICD-10-CM | POA: Diagnosis not present

## 2020-02-02 DIAGNOSIS — M546 Pain in thoracic spine: Secondary | ICD-10-CM | POA: Diagnosis not present

## 2020-02-02 DIAGNOSIS — M5417 Radiculopathy, lumbosacral region: Secondary | ICD-10-CM | POA: Diagnosis not present

## 2020-02-02 DIAGNOSIS — G479 Sleep disorder, unspecified: Secondary | ICD-10-CM | POA: Diagnosis not present

## 2020-02-02 DIAGNOSIS — M5413 Radiculopathy, cervicothoracic region: Secondary | ICD-10-CM | POA: Diagnosis not present

## 2020-02-02 DIAGNOSIS — M545 Low back pain: Secondary | ICD-10-CM | POA: Diagnosis not present

## 2020-02-02 DIAGNOSIS — M5414 Radiculopathy, thoracic region: Secondary | ICD-10-CM | POA: Diagnosis not present

## 2020-02-02 DIAGNOSIS — M5415 Radiculopathy, thoracolumbar region: Secondary | ICD-10-CM | POA: Diagnosis not present

## 2020-02-02 DIAGNOSIS — M7912 Myalgia of auxiliary muscles, head and neck: Secondary | ICD-10-CM | POA: Diagnosis not present

## 2020-02-02 DIAGNOSIS — M5412 Radiculopathy, cervical region: Secondary | ICD-10-CM | POA: Diagnosis not present

## 2020-02-08 ENCOUNTER — Encounter: Payer: Self-pay | Admitting: Family Medicine

## 2020-02-11 ENCOUNTER — Other Ambulatory Visit: Payer: Self-pay | Admitting: Family Medicine

## 2020-02-19 ENCOUNTER — Other Ambulatory Visit: Payer: Self-pay | Admitting: Family Medicine

## 2020-02-19 DIAGNOSIS — Z8619 Personal history of other infectious and parasitic diseases: Secondary | ICD-10-CM

## 2020-02-21 DIAGNOSIS — M5412 Radiculopathy, cervical region: Secondary | ICD-10-CM | POA: Diagnosis not present

## 2020-02-21 DIAGNOSIS — M7912 Myalgia of auxiliary muscles, head and neck: Secondary | ICD-10-CM | POA: Diagnosis not present

## 2020-02-21 DIAGNOSIS — M62830 Muscle spasm of back: Secondary | ICD-10-CM | POA: Diagnosis not present

## 2020-02-21 DIAGNOSIS — M5414 Radiculopathy, thoracic region: Secondary | ICD-10-CM | POA: Diagnosis not present

## 2020-02-21 DIAGNOSIS — M5417 Radiculopathy, lumbosacral region: Secondary | ICD-10-CM | POA: Diagnosis not present

## 2020-02-21 DIAGNOSIS — G479 Sleep disorder, unspecified: Secondary | ICD-10-CM | POA: Diagnosis not present

## 2020-02-21 DIAGNOSIS — M5413 Radiculopathy, cervicothoracic region: Secondary | ICD-10-CM | POA: Diagnosis not present

## 2020-02-21 DIAGNOSIS — M546 Pain in thoracic spine: Secondary | ICD-10-CM | POA: Diagnosis not present

## 2020-02-21 DIAGNOSIS — M545 Low back pain: Secondary | ICD-10-CM | POA: Diagnosis not present

## 2020-02-21 DIAGNOSIS — M5415 Radiculopathy, thoracolumbar region: Secondary | ICD-10-CM | POA: Diagnosis not present

## 2020-02-26 DIAGNOSIS — M5417 Radiculopathy, lumbosacral region: Secondary | ICD-10-CM | POA: Diagnosis not present

## 2020-02-26 DIAGNOSIS — M546 Pain in thoracic spine: Secondary | ICD-10-CM | POA: Diagnosis not present

## 2020-02-26 DIAGNOSIS — M5414 Radiculopathy, thoracic region: Secondary | ICD-10-CM | POA: Diagnosis not present

## 2020-02-26 DIAGNOSIS — M7912 Myalgia of auxiliary muscles, head and neck: Secondary | ICD-10-CM | POA: Diagnosis not present

## 2020-02-26 DIAGNOSIS — M545 Low back pain: Secondary | ICD-10-CM | POA: Diagnosis not present

## 2020-02-26 DIAGNOSIS — M62830 Muscle spasm of back: Secondary | ICD-10-CM | POA: Diagnosis not present

## 2020-02-26 DIAGNOSIS — G479 Sleep disorder, unspecified: Secondary | ICD-10-CM | POA: Diagnosis not present

## 2020-02-26 DIAGNOSIS — M5415 Radiculopathy, thoracolumbar region: Secondary | ICD-10-CM | POA: Diagnosis not present

## 2020-02-26 DIAGNOSIS — M5413 Radiculopathy, cervicothoracic region: Secondary | ICD-10-CM | POA: Diagnosis not present

## 2020-02-26 DIAGNOSIS — M5412 Radiculopathy, cervical region: Secondary | ICD-10-CM | POA: Diagnosis not present

## 2020-02-28 ENCOUNTER — Ambulatory Visit: Payer: Medicare HMO | Admitting: Family Medicine

## 2020-03-01 ENCOUNTER — Other Ambulatory Visit: Payer: Self-pay | Admitting: Family Medicine

## 2020-03-01 DIAGNOSIS — M797 Fibromyalgia: Secondary | ICD-10-CM

## 2020-03-04 ENCOUNTER — Encounter: Payer: Self-pay | Admitting: Family Medicine

## 2020-03-11 DIAGNOSIS — Z20828 Contact with and (suspected) exposure to other viral communicable diseases: Secondary | ICD-10-CM | POA: Diagnosis not present

## 2020-03-12 ENCOUNTER — Other Ambulatory Visit: Payer: Self-pay | Admitting: Family Medicine

## 2020-03-13 ENCOUNTER — Encounter: Payer: Self-pay | Admitting: Family Medicine

## 2020-03-20 ENCOUNTER — Telehealth: Payer: Self-pay

## 2020-03-20 DIAGNOSIS — M7540 Impingement syndrome of unspecified shoulder: Secondary | ICD-10-CM

## 2020-03-20 NOTE — Telephone Encounter (Signed)
Replied to patient via mychart

## 2020-03-20 NOTE — Telephone Encounter (Signed)
Patient is need ing referral to physical therapy, one was placed in 08/2019 and she was not able to attend.

## 2020-03-21 DIAGNOSIS — M546 Pain in thoracic spine: Secondary | ICD-10-CM | POA: Diagnosis not present

## 2020-03-21 DIAGNOSIS — M5414 Radiculopathy, thoracic region: Secondary | ICD-10-CM | POA: Diagnosis not present

## 2020-03-21 DIAGNOSIS — M5417 Radiculopathy, lumbosacral region: Secondary | ICD-10-CM | POA: Diagnosis not present

## 2020-03-21 DIAGNOSIS — M545 Low back pain: Secondary | ICD-10-CM | POA: Diagnosis not present

## 2020-03-21 DIAGNOSIS — G479 Sleep disorder, unspecified: Secondary | ICD-10-CM | POA: Diagnosis not present

## 2020-03-21 DIAGNOSIS — M62830 Muscle spasm of back: Secondary | ICD-10-CM | POA: Diagnosis not present

## 2020-03-21 DIAGNOSIS — M5415 Radiculopathy, thoracolumbar region: Secondary | ICD-10-CM | POA: Diagnosis not present

## 2020-03-26 DIAGNOSIS — M5412 Radiculopathy, cervical region: Secondary | ICD-10-CM | POA: Diagnosis not present

## 2020-03-26 DIAGNOSIS — M62830 Muscle spasm of back: Secondary | ICD-10-CM | POA: Diagnosis not present

## 2020-03-26 DIAGNOSIS — M5413 Radiculopathy, cervicothoracic region: Secondary | ICD-10-CM | POA: Diagnosis not present

## 2020-03-26 DIAGNOSIS — M7912 Myalgia of auxiliary muscles, head and neck: Secondary | ICD-10-CM | POA: Diagnosis not present

## 2020-03-26 DIAGNOSIS — M545 Low back pain: Secondary | ICD-10-CM | POA: Diagnosis not present

## 2020-03-26 DIAGNOSIS — G479 Sleep disorder, unspecified: Secondary | ICD-10-CM | POA: Diagnosis not present

## 2020-03-26 DIAGNOSIS — M5415 Radiculopathy, thoracolumbar region: Secondary | ICD-10-CM | POA: Diagnosis not present

## 2020-03-26 DIAGNOSIS — M5417 Radiculopathy, lumbosacral region: Secondary | ICD-10-CM | POA: Diagnosis not present

## 2020-03-26 DIAGNOSIS — M5414 Radiculopathy, thoracic region: Secondary | ICD-10-CM | POA: Diagnosis not present

## 2020-03-26 DIAGNOSIS — M546 Pain in thoracic spine: Secondary | ICD-10-CM | POA: Diagnosis not present

## 2020-03-28 DIAGNOSIS — R5383 Other fatigue: Secondary | ICD-10-CM | POA: Diagnosis not present

## 2020-04-03 ENCOUNTER — Other Ambulatory Visit: Payer: Self-pay

## 2020-04-03 ENCOUNTER — Ambulatory Visit: Payer: Medicare HMO | Attending: Family Medicine | Admitting: Family Medicine

## 2020-04-03 ENCOUNTER — Encounter: Payer: Self-pay | Admitting: Family Medicine

## 2020-04-03 DIAGNOSIS — R600 Localized edema: Secondary | ICD-10-CM | POA: Diagnosis not present

## 2020-04-03 DIAGNOSIS — M791 Myalgia, unspecified site: Secondary | ICD-10-CM | POA: Diagnosis not present

## 2020-04-03 DIAGNOSIS — R220 Localized swelling, mass and lump, head: Secondary | ICD-10-CM

## 2020-04-03 MED ORDER — PREDNISONE 20 MG PO TABS: 20.0000 mg | ORAL_TABLET | Freq: Every day | ORAL | 0 refills | Status: DC

## 2020-04-03 NOTE — Progress Notes (Signed)
Wants to discuss recent referral.  Having leg pain and states that her mobility is off due to pain.

## 2020-04-03 NOTE — Progress Notes (Signed)
Virtual Visit via Telephone Note  I connected with Amber Rose, on 04/03/2020 at 2:43 PM by telephone due to the COVID-19 pandemic and verified that I am speaking with the correct person using two identifiers.   Consent: I discussed the limitations, risks, security and privacy concerns of performing an evaluation and management service by telephone and the availability of in person appointments. I also discussed with the patient that there may be a patient responsible charge related to this service. The patient expressed understanding and agreed to proceed.   Location of Patient: Home  Location of Provider: Clinic   Persons participating in Telemedicine visit: Maryfer Tauzin Farrington-CMA Dr. Margarita Rana     History of Present Illness: [name] a 44 year old female with type 2 diabetes mellitus (A1c 6.5), hypertension, depression and anxiety, hydradenitis, tobacco abuse, fibromyalgia seen for an acute visit.  Her gait is abnormal and she has tripping, having difficulty going up the steps, getting into the car and she has to lift her legs to make them move. Her legs feel heavy and stiff. Symptoms have been on for a month She saw the Chiropractor with no relief She has scheduled PT for next week. Similar symptoms have occurred in the past and responded well to prednisone.  I had referred her to several specialist in the past including rheumatology who had declined referral.  She has also seen orthopedics. 1 week prior to her periods she develops facial edema, dyspnea. Followed by allergy and immunology and seen by [facility] and angioedema ruled out but notes suggest work-up for SVC syndrome.   Currently on disability but recently received a letter stating her case will be reevaluated as she currently works for a few hours to earn minimal wages so she can support herself. She will also need completion of a handicap form as she is unable to  ambulate for long distances when her legs flare up. Past Medical History:  Diagnosis Date  . Allergy   . Anxiety   . Arthritis   . Chronic UTI   . Depression   . Diabetes mellitus without complication ([place])   . Diverticulosis   . Fibromyalgia   . GERD (gastroesophageal reflux disease)   . Hiatal hernia   . Hyperlipidemia   . Hypertension   . Internal hemorrhoids   . Meningitis, viral   . Pancreatitis    Allergies  Allergen Reactions  . Metformin And Related Nausea And Vomiting  . Metronidazole Nausea And Vomiting  . Penicillins     childhood  . Xanax [Alprazolam]     "Caused upper respiratory symptoms" per pt  . Glyburide Other (See Comments)    "blood sugar dropped uncontrollably"  . Linagliptin Diarrhea and Other (See Comments)    Stomach pain, sinus infection  . Moxifloxacin Diarrhea    Current Outpatient Medications on File Prior to Visit  Medication Sig Dispense Refill  . acyclovir (ZOVIRAX) 400 MG tablet TAKE 1 TABLET BY MOUTH TWICE A DAY 180 tablet 0  . albuterol (PROAIR HFA) 108 (90 Base) MCG/ACT inhaler Inhale 2 puffs into the lungs every 4 (four) hours as needed for wheezing or shortness of breath. 3 Inhaler 0  . amLODipine (NORVASC) 10 MG tablet Take 1 tablet (10 mg total) by mouth daily. 90 tablet 1  . atorvastatin (LIPITOR) 20 MG tablet Take 1 tablet (20 mg total) by mouth daily. 90 tablet 1  . Blood Glucose Monitoring Suppl (ACCU-CHEK AVIVA PLUS) w/Device KIT USE AS DIRECTED DAILY. E11.9  1 kit 0  . BYDUREON BCISE 2 MG/0.85ML AUIJ INJECT 2 MG BY SUBQ ROUTE ONCE A WEEK 4 pen 1  . chlorhexidine (HIBICLENS) 4 % external liquid Apply topically daily as needed. 120 mL 2  . doxycycline (VIBRA-TABS) 100 MG tablet Take 1 tablet (100 mg total) by mouth 2 (two) times daily. 20 tablet 0  . DULoxetine (CYMBALTA) 60 MG capsule TAKE 1 CAPSULE BY MOUTH EVERY DAY 90 capsule 0  . hydrOXYzine (ATARAX/VISTARIL) 25 MG tablet TAKE 1 TABLET BY MOUTH THREE TIMES A DAY AS NEEDED 270  tablet 0  . ibuprofen (ADVIL,MOTRIN) 600 MG tablet Take 600 mg by mouth every 6 (six) hours as needed.    [name] Devices (ACCU-CHEK SOFTCLIX) lancets Use as instructed daily. 1 each 5  . lisinopril (ZESTRIL) 5 MG tablet Take 1 tablet (5 mg total) by mouth daily. 90 tablet 1  . methocarbamol (ROBAXIN) 750 MG tablet Take 1 tablet (750 mg total) by mouth every 8 (eight) hours as needed for muscle spasms. 180 tablet 1  . Multiple Vitamin (MULTI-VITAMIN DAILY) TABS Take 1 tablet by mouth daily as needed.    . nicotine (NICODERM CQ - DOSED IN MG/24 HOURS) 21 mg/24hr patch Place 21 mg onto the skin daily.    Marland Kitchen omeprazole (PRILOSEC) 40 MG capsule Take 1 tab every morning. 90 capsule 3  . Vitamin D, Ergocalciferol, (DRISDOL) 1.25 MG (50000 UNIT) CAPS capsule Take 1 capsule (50,000 Units total) by mouth every 7 (seven) days. 12 capsule 0  . buPROPion (WELLBUTRIN SR) 150 MG 12 hr tablet Take 1 tablet (150 mg total) by mouth 2 (two) times daily. (Patient not taking: Reported on 04/03/2020) 180 tablet 1  . Continuous Blood Gluc Receiver ([place]) [place] 1 each by Does not apply route as directed. (Patient not taking: Reported on 11/08/2019) 1 each 0  . Continuous Blood Gluc Sensor (DEXCOM G6 SENSOR) MISC 1 each by Does not apply route as directed. (Patient not taking: Reported on 08/17/2019) 3 each 11  . Continuous Blood Gluc Transmit (DEXCOM G6 TRANSMITTER) MISC 1 each by Does not apply route as directed. (Patient not taking: Reported on 11/08/2019) 1 each 3  . Exenatide ER (BYDUREON) 2 MG PEN 2 mg by Subconjunctival route once a week. 4 each 6  . predniSONE (DELTASONE) 20 MG tablet Take 1 tablet (20 mg total) by mouth daily with breakfast. (Patient not taking: Reported on 08/30/2019) 5 tablet 0   Current Facility-Administered Medications on File Prior to Visit  Medication Dose Route Frequency Provider Last Rate Last Admin  . 0.9 %  sodium chloride infusion  500 mL Intravenous Once Pyrtle, Lajuan Lines, MD         Observations/Objective: Awake, alert, oriented x3 No acute distress  Assessment and Plan: 1. Localized swelling, mass, and lump of head Facial edema Concern for SVC syndrome from allergy and immunology is noted We will order CT chest - CT Chest W Contrast; Future  2. Myalgia Recurrent with unknown etiology Symptoms typically respond to steroids hence I will place her on a short course I would love her to be evaluated for a rheumatologic condition however rheumatology declined referral in the past - predniSONE (DELTASONE) 20 MG tablet; Take 1 tablet (20 mg total) by mouth daily with breakfast.  Dispense: 5 tablet; Refill: 0  3. Facial edema Symptoms related to menstrual cycles Will need to exclude SVC syndrome   Follow Up Instructions: Return in about 1 month (around 05/03/2020) for complete  physical.    I discussed the assessment and treatment plan with the patient. The patient was provided an opportunity to ask questions and all were answered. The patient agreed with the plan and demonstrated an understanding of the instructions.   The patient was advised to call back or seek an in-person evaluation if the symptoms worsen or if the condition fails to improve as anticipated.     I provided 24 minutes total of non-face-to-face time during this encounter including median intraservice time, reviewing previous notes, investigations, ordering medications, medical decision making, coordinating care and patient verbalized understanding at the end of the visit.     Charlott Rakes, MD, FAAFP. [facility] and [facility], [place]   04/03/2020, 2:43 PM

## 2020-04-04 DIAGNOSIS — Z20828 Contact with and (suspected) exposure to other viral communicable diseases: Secondary | ICD-10-CM | POA: Diagnosis not present

## 2020-04-08 ENCOUNTER — Other Ambulatory Visit: Payer: Self-pay

## 2020-04-08 ENCOUNTER — Ambulatory Visit: Payer: Medicare HMO | Attending: Internal Medicine | Admitting: Physical Therapy

## 2020-04-08 ENCOUNTER — Telehealth: Payer: Self-pay

## 2020-04-08 ENCOUNTER — Encounter: Payer: Self-pay | Admitting: Physical Therapy

## 2020-04-08 DIAGNOSIS — M25511 Pain in right shoulder: Secondary | ICD-10-CM | POA: Insufficient documentation

## 2020-04-08 DIAGNOSIS — G8929 Other chronic pain: Secondary | ICD-10-CM | POA: Insufficient documentation

## 2020-04-08 DIAGNOSIS — M791 Myalgia, unspecified site: Secondary | ICD-10-CM

## 2020-04-08 NOTE — Therapy (Deleted)
[place], [place], [address] Phone: [phone]   Fax:  [phone]  Patient Details  Name: Kjersti Dittmer MRN: [id number] Date of Birth: 15-Jan-1976 Referring Provider:  Ladell Pier, MD  Encounter Date: 04/08/2020   Starr Lake 04/08/2020, 12:03 PM  [facility] [address] [place], [place], [address] Phone: [phone]   Fax:  [phone]

## 2020-04-08 NOTE — Telephone Encounter (Signed)
Referral has been placed. 

## 2020-04-08 NOTE — Telephone Encounter (Signed)
Patient is needing a new referral for her leg pain to PT

## 2020-04-08 NOTE — Telephone Encounter (Signed)
Referral to physical therapy was made in [place] for shoulder. pt states physical therapy was supposed to be for bilateral leg pain. Pt states difficulty ambulating. Pt says PCP told her she would put in referral for physical therapy.  Please make new referral for bilateral leg pain.

## 2020-04-08 NOTE — Therapy (Signed)
[place], [place], [address] Phone: [phone]   Fax:  [phone]  Physical Therapy Evaluation  Patient Details  Name: Amber Rose MRN: [id number] Date of Birth: 11-Feb-1976 No data recorded  Encounter Date: 04/08/2020   PT End of Session - 04/08/20 1202    Visit Number 1    PT Start Time 1146           Past Medical History:  Diagnosis Date  . Allergy   . Anxiety   . Arthritis   . Chronic UTI   . Depression   . Diabetes mellitus without complication ([place])   . Diverticulosis   . Fibromyalgia   . GERD (gastroesophageal reflux disease)   . Hiatal hernia   . Hyperlipidemia   . Hypertension   . Internal hemorrhoids   . Meningitis, viral   . Pancreatitis     Past Surgical History:  Procedure Laterality Date  . APPENDECTOMY  1990  . ceasarian  2002  . CESAREAN SECTION     x1  . CHOLECYSTECTOMY  2011  . COLONOSCOPY    . UPPER GASTROINTESTINAL ENDOSCOPY    . URINARY SURGERY     urethra sling, removal, and then revision 2016 (4 surgeries)  . WISDOM TOOTH EXTRACTION      There were no vitals filed for this visit.                    Objective measurements completed on examination: See above findings.                            Plan - 04/08/20 1159    Clinical Impression Statement pt arrived with referral for shoulder impingement but reports that it wasn't her shoulder it was her legs she was coming for. Further chart review she does have a referral for Sciatica that was sent in Novemeber of 2020. Unfortunatley, referrals expire after 6 months and that referral being sent over 6 months ago a new / updated referral would need to be sent. pt verbalized understanding and opted to go through the evaluation at another time with the correct referral.           Patient will benefit from skilled therapeutic intervention in order to improve the  following deficits and impairments:     Visit Diagnosis: Chronic right shoulder pain     Problem List Patient Active Problem List   Diagnosis Date Noted  . History of herpes zoster 01/17/2018  . Overactive bladder 12/28/2017  . Recurrent infections 11/19/2017  . Cyst of nasal cavity 11/19/2017  . Angio-edema 11/19/2017  . Abdominal pain, epigastric 11/04/2017  . Dysphagia 11/04/2017  . Gastroesophageal reflux disease 07/12/2017  . Degenerative disc disease at L5-S1 level 05/14/2017  . Fibromyalgia 05/14/2017  . ADD (attention deficit disorder) 05/14/2017  . Urinary incontinence 04/05/2017  . Diabetes mellitus without complication ([place]) 41/66/0630  . Thigh pain 02/02/2017  . Anxiety and depression 02/02/2017  . Current smoker 02/02/2017  . Facial flushing 02/02/2017  . Hypertension 10/23/2016  . Hidradenitis 10/23/2016    Starr Lake PT, DPT, LAT, ATC  04/08/20  12:03 PM      [facility] [address] [place], [place], [address] Phone: [phone]   Fax:  [phone]  Name: Amber Rose MRN: [id number] Date of Birth: 03-14-1976

## 2020-04-09 ENCOUNTER — Telehealth: Payer: Self-pay | Admitting: Family Medicine

## 2020-04-09 DIAGNOSIS — E1149 Type 2 diabetes mellitus with other diabetic neurological complication: Secondary | ICD-10-CM

## 2020-04-09 DIAGNOSIS — E559 Vitamin D deficiency, unspecified: Secondary | ICD-10-CM

## 2020-04-09 NOTE — Telephone Encounter (Signed)
Tedra Coupe from the imaging department called saying that the patient is going to have a CT done on 04/12/20 and needs an order for an I-STAT creatinine. Please f/u

## 2020-04-09 NOTE — Telephone Encounter (Signed)
Orders are in for labs for when she is ready for labs after insurance approves imaging.

## 2020-04-09 NOTE — Telephone Encounter (Signed)
Will route to PCP for review.  Case has not yet been approved by her insurance.

## 2020-04-09 NOTE — Telephone Encounter (Signed)
Patient was called and informed of referral being placed. 

## 2020-04-10 ENCOUNTER — Ambulatory Visit (HOSPITAL_COMMUNITY): Admission: RE | Admit: 2020-04-10 | Payer: Medicare HMO | Source: Ambulatory Visit

## 2020-04-11 ENCOUNTER — Other Ambulatory Visit: Payer: Self-pay | Admitting: Family Medicine

## 2020-04-12 ENCOUNTER — Ambulatory Visit (HOSPITAL_COMMUNITY): Admission: RE | Admit: 2020-04-12 | Payer: Medicare HMO | Source: Ambulatory Visit

## 2020-04-16 ENCOUNTER — Other Ambulatory Visit: Payer: Self-pay | Admitting: Family Medicine

## 2020-04-16 DIAGNOSIS — F419 Anxiety disorder, unspecified: Secondary | ICD-10-CM

## 2020-04-16 DIAGNOSIS — Z8619 Personal history of other infectious and parasitic diseases: Secondary | ICD-10-CM

## 2020-04-16 DIAGNOSIS — F32A Anxiety disorder, unspecified: Secondary | ICD-10-CM

## 2020-04-16 DIAGNOSIS — E559 Vitamin D deficiency, unspecified: Secondary | ICD-10-CM

## 2020-04-17 ENCOUNTER — Ambulatory Visit (HOSPITAL_COMMUNITY): Payer: Medicare HMO

## 2020-04-17 MED ORDER — HYDROXYZINE HCL 25 MG PO TABS: 25.0000 mg | ORAL_TABLET | Freq: Three times a day (TID) | ORAL | 0 refills | Status: DC | PRN

## 2020-04-17 MED ORDER — ACYCLOVIR 400 MG PO TABS: 400.0000 mg | ORAL_TABLET | Freq: Two times a day (BID) | ORAL | 0 refills | Status: DC

## 2020-04-17 MED ORDER — VITAMIN D (ERGOCALCIFEROL) 1.25 MG (50000 UNIT) PO CAPS: 50000.0000 [IU] | ORAL_CAPSULE | ORAL | 0 refills | Status: DC

## 2020-04-22 ENCOUNTER — Telehealth: Payer: Self-pay | Admitting: Family Medicine

## 2020-04-22 ENCOUNTER — Telehealth: Payer: Self-pay

## 2020-04-22 NOTE — Telephone Encounter (Signed)
Tedra Coupe from the [facility] department form [facility] called saying patient has an upcoming appt and needs an order for an ISTAT creatinine. Please f/u

## 2020-04-22 NOTE — Telephone Encounter (Signed)
Patient wants to know what are her next steps since her CT scan has been denied.

## 2020-04-22 NOTE — Telephone Encounter (Signed)
I have referred her to different specialists including Allergy and Immunology, Gyn, Rheumatology, Endocrinology and CT was the last resort as recommended by [facility]. Unfortunately I have nothing additional to offer at this point. She might benefit from receiving care at a tertiary Center like [facility], [facility] or [facility].

## 2020-04-23 DIAGNOSIS — R5383 Other fatigue: Secondary | ICD-10-CM | POA: Diagnosis not present

## 2020-04-24 ENCOUNTER — Ambulatory Visit (HOSPITAL_COMMUNITY): Admission: RE | Admit: 2020-04-24 | Payer: Medicare HMO | Source: Ambulatory Visit

## 2020-04-25 ENCOUNTER — Encounter: Payer: Self-pay | Admitting: Family Medicine

## 2020-04-25 NOTE — Telephone Encounter (Signed)
Patient sent a mychart message saying that she feel better after taking the recent prescribed medications

## 2020-04-26 ENCOUNTER — Other Ambulatory Visit: Payer: Self-pay

## 2020-04-26 ENCOUNTER — Encounter: Payer: Self-pay | Admitting: Rehabilitative and Restorative Service Providers"

## 2020-04-26 ENCOUNTER — Ambulatory Visit: Payer: Medicare HMO | Attending: Family Medicine | Admitting: Rehabilitative and Restorative Service Providers"

## 2020-04-26 DIAGNOSIS — M25511 Pain in right shoulder: Secondary | ICD-10-CM | POA: Diagnosis not present

## 2020-04-26 DIAGNOSIS — G8929 Other chronic pain: Secondary | ICD-10-CM | POA: Insufficient documentation

## 2020-04-26 DIAGNOSIS — M25551 Pain in right hip: Secondary | ICD-10-CM | POA: Diagnosis not present

## 2020-04-26 DIAGNOSIS — M25552 Pain in left hip: Secondary | ICD-10-CM | POA: Diagnosis not present

## 2020-04-26 DIAGNOSIS — R2681 Unsteadiness on feet: Secondary | ICD-10-CM | POA: Diagnosis not present

## 2020-04-26 DIAGNOSIS — M6281 Muscle weakness (generalized): Secondary | ICD-10-CM | POA: Insufficient documentation

## 2020-04-26 NOTE — Therapy (Signed)
[place], [place], [address] Phone: [phone]   Fax:  [phone]  Physical Therapy Evaluation  Patient Details  Name: Amber Rose MRN: [id number] Date of Birth: 11-Jul-1976 Referring Provider (PT): Dr. Charlott Rakes   Encounter Date: 04/26/2020   PT End of Session - 04/26/20 1234    Visit Number 1    Number of Visits 12    Date for PT Re-Evaluation 06/07/20    Authorization Type [employer] MCR    PT Start Time 1050    PT Stop Time 1135    PT Time Calculation (min) 45 min    Activity Tolerance Patient tolerated treatment well;Patient limited by pain    Behavior During Therapy [facility] for tasks assessed/performed           Past Medical History:  Diagnosis Date  . Allergy   . Anxiety   . Arthritis   . Chronic UTI   . Depression   . Diabetes mellitus without complication ([place])   . Diverticulosis   . Fibromyalgia   . GERD (gastroesophageal reflux disease)   . Hiatal hernia   . Hyperlipidemia   . Hypertension   . Internal hemorrhoids   . Meningitis, viral   . Pancreatitis     Past Surgical History:  Procedure Laterality Date  . APPENDECTOMY  1990  . ceasarian  2002  . CESAREAN SECTION     x1  . CHOLECYSTECTOMY  2011  . COLONOSCOPY    . UPPER GASTROINTESTINAL ENDOSCOPY    . URINARY SURGERY     urethra sling, removal, and then revision 2016 (4 surgeries)  . WISDOM TOOTH EXTRACTION      There were no vitals filed for this visit.    Subjective Assessment - 04/26/20 1057    Subjective Pt presents with diagnosis of myalgia with bil LE weakness with difficulty in walking. Both legs painful but which is worse alternates but primarily L now. [place] is a grabbing pain and is sharp going down the inner thigh leg.    Pertinent History has been on Steroids recently but finishing dosage. burning is in the thigh. been going on for several years without known onset. Diabetic    Limitations  Lifting;Standing;Walking    How long can you sit comfortably? 1 hour    How long can you stand comfortably? 10 min    How long can you walk comfortably? 1/2 block    Patient Stated Goals move without pain    Currently in Pain? Yes    Pain Score 7     Pain Location Leg    Pain Orientation Left;Right    Pain Descriptors / Indicators Grimacing;Constant;Burning;Shooting    Pain Type Chronic pain    Pain Radiating Towards inner thigh    Pain Onset More than a month ago    Pain Frequency Constant    Aggravating Factors  any form of mobility    Pain Relieving Factors rest    Effect of Pain on Daily Activities hinders all activities/movement    Multiple Pain Sites No              [facility] PT Assessment - 04/26/20 0001      Assessment   Medical Diagnosis myalgia    Referring Provider (PT) Dr. Charlott Rakes    Onset Date/Surgical Date 03/03/20    Hand Dominance Right    Next MD Visit not set    Prior Therapy evaluated for shoulder recently  Precautions   Precautions None      Restrictions   Weight Bearing Restrictions No      Balance Screen   Has the patient fallen in the past 6 months No    Has the patient had a decrease in activity level because of a fear of falling?  Yes    Is the patient reluctant to leave their home because of a fear of falling?  No      Home Ecologist residence      Prior Function   Level of Independence Independent      Cognition   Overall Cognitive Status Within Functional Limits for tasks assessed      Observation/Other Assessments   Focus on Therapeutic Outcomes (FOTO)  62% limitation    Other Surveys  --   [place] -     Editor, commissioning Impaired by gross assessment   decreased throughout L LE except S1 dermatome   Additional Comments pt noted to spasm bil LEs with all movements      Coordination   Gross Motor Movements are Fluid and Coordinated No      ROM / Strength   AROM / PROM / Strength  AROM;PROM;Strength      AROM   Overall AROM  Within functional limits for tasks performed    Overall AROM Comments seated IR R 10 and L 6 degrees prior to compensation      PROM   Overall PROM Comments Bil hip flexion with tightness noted end range of motion supine at 90-95 degrees, ER normal but IR limited supine with tightness felt and pt inability to relax. . Same feel on contralateral side.       Strength   Overall Strength Comments R: hip abdct 4-, hip flex 3+, knee ext 3, knee flex 3, DF 4. L  hip abdct 4-/5,  hip flx 2-, knee ext 2, knee flex 3-, DF 3+. Bil hip ext 4-      Palpation   Spinal mobility good; no reproduction of pain or burning in legs    SI assessment  SI level; oscillation does not reproduce pain    Palpation comment Piriformis palpation equal but pt does report a "pinching" sensation to deep Piriformis palpation on L       Functional Gait  Assessment   Gait assessed  --   limited trunk rotation, increased L toeing out                     Objective measurements completed on examination: See above findings.                 PT Short Term Goals - 04/26/20 1240      PT SHORT TERM GOAL #1   Title Pt will be I with HEP    Baseline not issued    Time 3    Period Weeks    Status New    Target Date 05/17/20      PT SHORT TERM GOAL #2   Title Pt will report at least 25% improvement in getting her legs in and out of the car    Baseline she has to use her hands to help get her legs in and out of the car    Time 3    Period Weeks    Status New    Target Date 05/17/20      PT SHORT TERM GOAL #3  Title Pt will be able to roll over in bed with 25% less difficulty    Baseline unable    Time 3    Period Weeks    Status New    Target Date 05/17/20             PT Long Term Goals - 04/26/20 1232      PT LONG TERM GOAL #1   Title Foto will demonstrate 50% limitation    Baseline 62% limitation    Time 6    Period Weeks    Status  New    Target Date 06/07/20      PT LONG TERM GOAL #2   Title Pt will demo improved bil LE strength >/= 4/5 all major muscle groups to assist with walking safely.    Baseline R: hip abdct 4-, hip flex 3+, knee ext 3, knee flex 3, DF 4. L  hip abdct 4-/5,  hip flx 2-, knee ext 2, knee flex 3-, DF 3+. Bil hip ext 4-    Time 6    Period Weeks    Status New    Target Date 06/07/20      PT LONG TERM GOAL #3   Title Pt will report stability when pivoting when she is walking 75% of the time    Baseline unmet    Time 6    Period Weeks    Status New    Target Date 06/07/20      PT LONG TERM GOAL #4   Title Pt will report improved functional strength to sit on the toilet with 75% less difficulty    Baseline 0%    Time 6    Period Weeks    Status New    Target Date 06/07/20      PT LONG TERM GOAL #5   Title Pt will demo improvement in bil hip IR >/= 15 to assist with mobility    Baseline R 10 degrees and L 6 degrees    Time 6    Period Weeks    Status New    Target Date 06/07/20                  Plan - 04/26/20 1236    Clinical Impression Statement Pt with new order for myalgia with bil LE weakness with difficulty in walking. pt with weakness bil LEs L > R and with bil hip stiffness L > R. She reports stubbing her toes with ambulation at times and does not report time of day or fatigue as being a factor. Pt would benefit from PT for LE strengthening and balance training to improve mobility along with hip flexibility exercises to assist with function. Modalities and manual therapy will be used as needed. Pt with recent referral for shoulder impingement which had expired. She reports her leg pain to be her most concerning factor at this time.    Examination-Activity Limitations Bathing;Squat;Bed Mobility;Stairs;Locomotion Level;Bend;Stand;Toileting;Transfers;Sit;Dressing    Examination-Participation Restrictions Laundry;Shop;Cleaning;Community Activity;Driving     Stability/Clinical Decision Making Stable/Uncomplicated    Clinical Decision Making Low    Rehab Potential Fair    PT Frequency 2x / week    PT Duration 6 weeks    PT Treatment/Interventions ADLs/Self Care Home Management;Cryotherapy;Ultrasound;Traction;Moist Heat;Electrical Stimulation;Gait training;Stair training;Functional mobility training;Neuromuscular re-education;Balance training;Therapeutic exercise;Therapeutic activities;Patient/family education;Manual techniques;Passive range of motion;Dry needling    PT Next Visit Plan Issue HEP for bil LE strengthening; perform Berg    Consulted and Agree with Plan of Care Patient  Patient will benefit from skilled therapeutic intervention in order to improve the following deficits and impairments:  Abnormal gait, Decreased balance, Decreased endurance, Decreased mobility, Difficulty walking, Impaired sensation, Decreased range of motion, Decreased activity tolerance, Decreased strength, Impaired flexibility, Pain  Visit Diagnosis: Muscle weakness (generalized)  Pain in left hip  Pain in right hip  Unsteadiness on feet     Problem List Patient Active Problem List   Diagnosis Date Noted  . History of herpes zoster 01/17/2018  . Overactive bladder 12/28/2017  . Recurrent infections 11/19/2017  . Cyst of nasal cavity 11/19/2017  . Angio-edema 11/19/2017  . Abdominal pain, epigastric 11/04/2017  . Dysphagia 11/04/2017  . Gastroesophageal reflux disease 07/12/2017  . Degenerative disc disease at L5-S1 level 05/14/2017  . Fibromyalgia 05/14/2017  . ADD (attention deficit disorder) 05/14/2017  . Urinary incontinence 04/05/2017  . Diabetes mellitus without complication ([place]) 40/81/4481  . Thigh pain 02/02/2017  . Anxiety and depression 02/02/2017  . Current smoker 02/02/2017  . Facial flushing 02/02/2017  . Hypertension 10/23/2016  . Hidradenitis 10/23/2016    Myra Rude, PT 04/26/2020, 12:49 PM  [facility] [address] [place], [place], [address] Phone: [phone]   Fax:  [phone]  Name: Amber Rose MRN: [id number] Date of Birth: Apr 14, 1976

## 2020-04-26 NOTE — Therapy (Signed)
[place], [place], [address] Phone: [phone]   Fax:  [phone]  Physical Therapy Evaluation  Patient Details  Name: Amber Rose MRN: [id number] Date of Birth: [birth year] Referring Provider (PT): Dr. Charlott Rakes   Encounter Date: 04/26/2020   PT End of Session - 04/26/20 1234    Visit Number 1    Number of Visits 12    Date for PT Re-Evaluation 06/07/20    Authorization Type Aetna MCR    PT Start Time 1050    PT Stop Time 1135    PT Time Calculation (min) 45 min    Activity Tolerance Patient tolerated treatment well;Patient limited by pain    Behavior During Therapy [facility] for tasks assessed/performed           Past Medical History:  Diagnosis Date  . Allergy   . Anxiety   . Arthritis   . Chronic UTI   . Depression   . Diabetes mellitus without complication ([place])   . Diverticulosis   . Fibromyalgia   . GERD (gastroesophageal reflux disease)   . Hiatal hernia   . Hyperlipidemia   . Hypertension   . Internal hemorrhoids   . Meningitis, viral   . Pancreatitis     Past Surgical History:  Procedure Laterality Date  . APPENDECTOMY  1990  . ceasarian  2002  . CESAREAN SECTION     x1  . CHOLECYSTECTOMY  2011  . COLONOSCOPY    . UPPER GASTROINTESTINAL ENDOSCOPY    . URINARY SURGERY     urethra sling, removal, and then revision 2016 (4 surgeries)  . WISDOM TOOTH EXTRACTION      There were no vitals filed for this visit.    Subjective Assessment - 04/26/20 1057    Subjective Pt presents with diagnosis of myalgia with bil LE weakness with difficulty in walking. Both legs painful but which is worse alternates but primarily L now. [place] is a grabbing pain and is sharp going down the inner thigh leg.    Pertinent History has been on Steroids recently but finishing dosage. burning is in the thigh. been going on for several years without known onset. Diabetic    Limitations  Lifting;Standing;Walking    How long can you sit comfortably? 1 hour    How long can you stand comfortably? 10 min    How long can you walk comfortably? 1/2 block    Patient Stated Goals move without pain    Currently in Pain? Yes    Pain Score 7     Pain Location Leg    Pain Orientation Left;Right    Pain Descriptors / Indicators Grimacing;Constant;Burning;Shooting    Pain Type Chronic pain    Pain Radiating Towards inner thigh    Pain Onset More than a month ago    Pain Frequency Constant    Aggravating Factors  any form of mobility    Pain Relieving Factors rest    Effect of Pain on Daily Activities hinders all activities/movement    Multiple Pain Sites No              [facility] PT Assessment - 04/26/20 0001      Assessment   Medical Diagnosis myalgia    Referring Provider (PT) Dr. Charlott Rakes    Onset Date/Surgical Date 03/03/20    Hand Dominance Right    Next MD Visit not set    Prior Therapy evaluated for shoulder recently  Precautions   Precautions None      Restrictions   Weight Bearing Restrictions No      Balance Screen   Has the patient fallen in the past 6 months No    Has the patient had a decrease in activity level because of a fear of falling?  Yes    Is the patient reluctant to leave their home because of a fear of falling?  No      Home [employer] residence      Prior Function   Level of Independence Independent      Cognition   Overall Cognitive Status Within Functional Limits for tasks assessed      Sensation   Light Touch Impaired by gross assessment   decreased throughout L LE except S1 dermatome     Coordination   Gross Motor Movements are Fluid and Coordinated No      ROM / Strength   AROM / PROM / Strength AROM;PROM;Strength      AROM   Overall AROM  Within functional limits for tasks performed    Overall AROM Comments seated IR R 10 and L 6 degrees prior to compensation      PROM   Overall  PROM Comments Bil hip flexion with tightness noted end range of motion supine at 90-95 degrees, ER normal but IR limited supine with tightness felt and pt inability to relax. . Same feel on contralateral side.       Strength   Overall Strength Comments R: hip abdct 4-, hip flex 3+, knee ext 3, knee flex 3, DF 4. L  hip abdct 4-/5,  hip flx 2-, knee ext 2, knee flex 3-, DF 3+. Bil hip ext 4-      Palpation   Spinal mobility good; no reproduction of pain or burning in legs    SI assessment  SI level; oscillation does not reproduce pain    Palpation comment Piriformis palpation equal but pt does report a "pinching" sensation to deep Piriformis palpation on L       Functional Gait  Assessment   Gait assessed  --   limited trunk rotation, increased L toeing out                     Objective measurements completed on examination: See above findings.                 PT Short Term Goals - 04/26/20 1240      PT SHORT TERM GOAL #1   Title Pt will be I with HEP    Baseline not issued    Time 3    Period Weeks    Status New    Target Date 05/17/20      PT SHORT TERM GOAL #2   Title Pt will report at least 25% improvement in getting her legs in and out of the car    Baseline she has to use her hands to help get her legs in and out of the car    Time 3    Period Weeks    Status New    Target Date 05/17/20      PT SHORT TERM GOAL #3   Title Pt will be able to roll over in bed with 25% less difficulty    Baseline unable    Time 3    Period Weeks    Status New    Target Date 05/17/20  PT Long Term Goals - 04/26/20 1232      PT LONG TERM GOAL #1   Title Foto will demonstrate 50% limitation    Baseline 62% limitation    Time 6    Period Weeks    Status New    Target Date 06/07/20      PT LONG TERM GOAL #2   Title Pt will demo improved bil LE strength >/= 4/5 all major muscle groups to assist with walking safely.    Baseline R: hip abdct  4-, hip flex 3+, knee ext 3, knee flex 3, DF 4. L  hip abdct 4-/5,  hip flx 2-, knee ext 2, knee flex 3-, DF 3+. Bil hip ext 4-    Time 6    Period Weeks    Status New    Target Date 06/07/20      PT LONG TERM GOAL #3   Title Pt will report stability when pivoting when she is walking 75% of the time    Baseline unmet    Time 6    Period Weeks    Status New    Target Date 06/07/20      PT LONG TERM GOAL #4   Title Pt will report improved functional strength to sit on the toilet with 75% less difficulty    Baseline 0%    Time 6    Period Weeks    Status New    Target Date 06/07/20      PT LONG TERM GOAL #5   Title Pt will demo improvement in bil hip IR >/= 15 to assist with mobility    Baseline R 10 degrees and L 6 degrees    Time 6    Period Weeks    Status New    Target Date 06/07/20                  Plan - 04/26/20 1236    Clinical Impression Statement Pt with new order for myalgia with bil LE weakness with difficulty in walking. pt with weakness bil LEs L > R and with bil hip stiffness L > R. She reports stubbing her toes with ambulation at times and does not report time of day or fatigue as being a factor. Pt would benefit from PT for LE strengthening and balance training to improve mobility along with hip flexibility exercises to assist with function. Modalities and manual therapy will be used as needed. Pt with recent referral for shoulder impingement which had expired. She reports her leg pain to be her most concerning factor at this time.    Examination-Activity Limitations Bathing;Squat;Bed Mobility;Stairs;Locomotion Level;Bend;Stand;Toileting;Transfers;Sit;Dressing    Examination-Participation Restrictions Laundry;Shop;Cleaning;Community Activity;Driving    Stability/Clinical Decision Making Stable/Uncomplicated    Clinical Decision Making Low    Rehab Potential Fair    PT Frequency 2x / week    PT Duration 6 weeks    PT Treatment/Interventions ADLs/Self Care  Home Management;Cryotherapy;Ultrasound;Traction;Moist Heat;Electrical Stimulation;Gait training;Stair training;Functional mobility training;Neuromuscular re-education;Balance training;Therapeutic exercise;Therapeutic activities;Patient/family education;Manual techniques;Passive range of motion;Dry needling    PT Next Visit Plan Issue HEP for bil LE strengthening; perform Berg    Consulted and Agree with Plan of Care Patient           Patient will benefit from skilled therapeutic intervention in order to improve the following deficits and impairments:  Abnormal gait, Decreased balance, Decreased endurance, Decreased mobility, Difficulty walking, Impaired sensation, Decreased range of motion, Decreased activity tolerance, Decreased strength, Impaired flexibility, Pain  Visit Diagnosis: Muscle weakness (generalized)  Pain in left hip  Pain in right hip  Unsteadiness on feet     Problem List Patient Active Problem List   Diagnosis Date Noted  . History of herpes zoster 01/17/2018  . Overactive bladder 12/28/2017  . Recurrent infections 11/19/2017  . Cyst of nasal cavity 11/19/2017  . Angio-edema 11/19/2017  . Abdominal pain, epigastric 11/04/2017  . Dysphagia 11/04/2017  . Gastroesophageal reflux disease 07/12/2017  . Degenerative disc disease at L5-S1 level 05/14/2017  . Fibromyalgia 05/14/2017  . ADD (attention deficit disorder) 05/14/2017  . Urinary incontinence 04/05/2017  . Diabetes mellitus without complication ([place]) 74/09/8785  . Thigh pain 02/02/2017  . Anxiety and depression 02/02/2017  . Current smoker 02/02/2017  . Facial flushing 02/02/2017  . Hypertension 10/23/2016  . Hidradenitis 10/23/2016    Amber Rose 04/26/2020, 12:47 PM  [facility] [address] [place], [place], [address] Phone: [phone]   Fax:  [phone]  Name: Amber Rose MRN: [id number] Date of Birth: 08/26/1976

## 2020-04-26 NOTE — Addendum Note (Signed)
Addended by: Bernerd Pho on: 04/26/2020 12:52 PM   Modules accepted: Orders

## 2020-05-08 ENCOUNTER — Ambulatory Visit: Payer: Medicare HMO | Admitting: Physical Therapy

## 2020-05-08 ENCOUNTER — Encounter: Payer: Self-pay | Admitting: Physical Therapy

## 2020-05-08 ENCOUNTER — Other Ambulatory Visit: Payer: Self-pay

## 2020-05-08 DIAGNOSIS — G8929 Other chronic pain: Secondary | ICD-10-CM

## 2020-05-08 DIAGNOSIS — M25552 Pain in left hip: Secondary | ICD-10-CM | POA: Diagnosis not present

## 2020-05-08 DIAGNOSIS — M25511 Pain in right shoulder: Secondary | ICD-10-CM | POA: Diagnosis not present

## 2020-05-08 DIAGNOSIS — M25551 Pain in right hip: Secondary | ICD-10-CM

## 2020-05-08 DIAGNOSIS — R2681 Unsteadiness on feet: Secondary | ICD-10-CM | POA: Diagnosis not present

## 2020-05-08 DIAGNOSIS — M6281 Muscle weakness (generalized): Secondary | ICD-10-CM | POA: Diagnosis not present

## 2020-05-08 NOTE — Therapy (Signed)
[place], [place], [address] Phone: [phone]   Fax:  [phone]  Physical Therapy Treatment  Patient Details  Name: Amber Rose MRN: [id number] Date of Birth: [birth year] Referring Provider (PT): Dr. Charlott Rakes   Encounter Date: 05/08/2020   PT End of Session - 05/08/20 0953    Visit Number 2    Number of Visits 12    Date for PT Re-Evaluation 06/07/20    Authorization Type Aetna [facility]    PT Start Time 8563   Patient came at 75 but therapy not paged   PT Stop Time 1015    PT Time Calculation (min) 26 min    Activity Tolerance Patient tolerated treatment well;Patient limited by pain    Behavior During Therapy [facility] for tasks assessed/performed           Past Medical History:  Diagnosis Date  . Allergy   . Anxiety   . Arthritis   . Chronic UTI   . Depression   . Diabetes mellitus without complication ([place])   . Diverticulosis   . Fibromyalgia   . GERD (gastroesophageal reflux disease)   . Hiatal hernia   . Hyperlipidemia   . Hypertension   . Internal hemorrhoids   . Meningitis, viral   . Pancreatitis     Past Surgical History:  Procedure Laterality Date  . APPENDECTOMY  1990  . ceasarian  2002  . CESAREAN SECTION     x1  . CHOLECYSTECTOMY  2011  . COLONOSCOPY    . UPPER GASTROINTESTINAL ENDOSCOPY    . URINARY SURGERY     urethra sling, removal, and then revision 2016 (4 surgeries)  . WISDOM TOOTH EXTRACTION      There were no vitals filed for this visit.   Subjective Assessment - 05/08/20 0955    Subjective Patient reports her pain is a little better today. She reports her pain can reach a 12/10 but today it is not as bad. The pain generally goes down the front of her legs.    Pertinent History has been on Steroids recently but finishing dosage. burning is in the thigh. been going on for several years without known onset. Diabetic    Limitations  Lifting;Standing;Walking    How long can you sit comfortably? 1 hour    How long can you stand comfortably? 10 min    How long can you walk comfortably? 1/2 block    Currently in Pain? Yes    Pain Score 6     Pain Location Leg    Pain Orientation Left;Right    Pain Descriptors / Indicators Aching    Pain Type Chronic pain    Pain Onset More than a month ago    Pain Frequency Constant    Aggravating Factors  any mobility    Pain Relieving Factors rest    Effect of Pain on Daily Activities hinders activity    Multiple Pain Sites No                             [facility] Adult PT Treatment/Exercise - 05/08/20 0001      Self-Care   Self-Care Other Self-Care Comments    Other Self-Care Comments  symptom management; trigger point release with tennis ball      Exercises   Exercises Lumbar      Lumbar Exercises: Stretches   Lower Trunk Rotation --   20 reps  Hip Flexor Stretch 30 seconds;Right;Left    Piriformis Stretch 2 reps;30 seconds;Right;Left    Other Lumbar Stretch Exercise Seated hamstring stretch 30 sec bilat      Lumbar Exercises: Supine   Ab Set 10 reps   cueing for tight core   Other Supine Lumbar Exercises marches x 10 each leg                  PT Education - 05/08/20 1634    Education Details updated HEP. Improtance of stretching    Person(s) Educated Patient    Methods Explanation;Demonstration;Tactile cues;Handout    Comprehension Verbalized understanding;Returned demonstration;Need further instruction            PT Short Term Goals - 04/26/20 1240      PT SHORT TERM GOAL #1   Title Pt will be I with HEP    Baseline not issued    Time 3    Period Weeks    Status New    Target Date 05/17/20      PT SHORT TERM GOAL #2   Title Pt will report at least 25% improvement in getting her legs in and out of the car    Baseline she has to use her hands to help get her legs in and out of the car    Time 3    Period Weeks    Status New     Target Date 05/17/20      PT SHORT TERM GOAL #3   Title Pt will be able to roll over in bed with 25% less difficulty    Baseline unable    Time 3    Period Weeks    Status New    Target Date 05/17/20             PT Long Term Goals - 04/26/20 1232      PT LONG TERM GOAL #1   Title Foto will demonstrate 50% limitation    Baseline 62% limitation    Time 6    Period Weeks    Status New    Target Date 06/07/20      PT LONG TERM GOAL #2   Title Pt will demo improved bil LE strength >/= 4/5 all major muscle groups to assist with walking safely.    Baseline R: hip abdct 4-, hip flex 3+, knee ext 3, knee flex 3, DF 4. L  hip abdct 4-/5,  hip flx 2-, knee ext 2, knee flex 3-, DF 3+. Bil hip ext 4-    Time 6    Period Weeks    Status New    Target Date 06/07/20      PT LONG TERM GOAL #3   Title Pt will report stability when pivoting when she is walking 75% of the time    Baseline unmet    Time 6    Period Weeks    Status New    Target Date 06/07/20      PT LONG TERM GOAL #4   Title Pt will report improved functional strength to sit on the toilet with 75% less difficulty    Baseline 0%    Time 6    Period Weeks    Status New    Target Date 06/07/20      PT LONG TERM GOAL #5   Title Pt will demo improvement in bil hip IR >/= 15 to assist with mobility    Baseline R 10 degrees and L 6 degrees  Time 6    Period Weeks    Status New    Target Date 06/07/20                 Plan - 05/08/20 1422    Clinical Impression Statement Pt's leg and hip pain is her biggest concern today. Therapy began working on stretching and strenghtening to help decrease pain in her hips and inner leg. Therapy educated patient to try these stretches when her pain increases and assess which ones help. Presents with hip flexor tightness when in the thomas test position. Ab sets and supine marches were given to began working on core strengthening to provide stability for her lower back. Pt  was instructed on using a tennis ball for self trigger point release.    Examination-Activity Limitations Bathing;Squat;Bed Mobility;Stairs;Locomotion Level;Bend;Stand;Toileting;Transfers;Sit;Dressing    Examination-Participation Restrictions Laundry;Shop;Cleaning;Community Activity;Driving    Stability/Clinical Decision Making Stable/Uncomplicated    Clinical Decision Making Low    Rehab Potential Fair    PT Frequency 2x / week    PT Duration 6 weeks    PT Treatment/Interventions ADLs/Self Care Home Management;Cryotherapy;Ultrasound;Traction;Moist Heat;Electrical Stimulation;Gait training;Stair training;Functional mobility training;Neuromuscular re-education;Balance training;Therapeutic exercise;Therapeutic activities;Patient/family education;Manual techniques;Passive range of motion;Dry needling    PT Next Visit Plan assess tolerance to stretches; bil LE strengthening; perform Berg    PT Home Exercise Plan lower trunk rotation, piriformis stretch, hip flexor stretch, seated hamstring stretch, ab sets, supine marches, self trigger point release with tennis ball    Consulted and Agree with Plan of Care Patient           Patient will benefit from skilled therapeutic intervention in order to improve the following deficits and impairments:  Abnormal gait, Decreased balance, Decreased endurance, Decreased mobility, Difficulty walking, Impaired sensation, Decreased range of motion, Decreased activity tolerance, Decreased strength, Impaired flexibility, Pain  Visit Diagnosis: Muscle weakness (generalized)  Pain in left hip  Pain in right hip  Unsteadiness on feet  Chronic right shoulder pain     Problem List Patient Active Problem List   Diagnosis Date Noted  . History of herpes zoster 01/17/2018  . Overactive bladder 12/28/2017  . Recurrent infections 11/19/2017  . Cyst of nasal cavity 11/19/2017  . Angio-edema 11/19/2017  . Abdominal pain, epigastric 11/04/2017  . Dysphagia  11/04/2017  . Gastroesophageal reflux disease 07/12/2017  . Degenerative disc disease at L5-S1 level 05/14/2017  . Fibromyalgia 05/14/2017  . ADD (attention deficit disorder) 05/14/2017  . Urinary incontinence 04/05/2017  . Diabetes mellitus without complication ([place]) 43/56/8616  . Thigh pain 02/02/2017  . Anxiety and depression 02/02/2017  . Current smoker 02/02/2017  . Facial flushing 02/02/2017  . Hypertension 10/23/2016  . Hidradenitis 10/23/2016    Carney Living PT DPT  05/08/2020, 4:35 PM   Minna Merritts SPT  05/08/2020  [facility] [place], [place], [address] Phone: [phone]   Fax:  [phone]  Name: Amber Rose MRN: [id number] Date of Birth: 14-Feb-1976

## 2020-05-13 ENCOUNTER — Other Ambulatory Visit: Payer: Self-pay | Admitting: Family Medicine

## 2020-05-13 DIAGNOSIS — F32A Anxiety disorder, unspecified: Secondary | ICD-10-CM

## 2020-05-13 DIAGNOSIS — F419 Anxiety disorder, unspecified: Secondary | ICD-10-CM

## 2020-05-16 ENCOUNTER — Other Ambulatory Visit: Payer: Self-pay | Admitting: Family Medicine

## 2020-05-16 ENCOUNTER — Other Ambulatory Visit: Payer: Self-pay

## 2020-05-16 ENCOUNTER — Ambulatory Visit: Payer: Medicare HMO | Admitting: Rehabilitative and Restorative Service Providers"

## 2020-05-16 ENCOUNTER — Encounter: Payer: Self-pay | Admitting: Rehabilitative and Restorative Service Providers"

## 2020-05-16 DIAGNOSIS — G8929 Other chronic pain: Secondary | ICD-10-CM | POA: Diagnosis not present

## 2020-05-16 DIAGNOSIS — M6281 Muscle weakness (generalized): Secondary | ICD-10-CM

## 2020-05-16 DIAGNOSIS — M25552 Pain in left hip: Secondary | ICD-10-CM | POA: Diagnosis not present

## 2020-05-16 DIAGNOSIS — M25551 Pain in right hip: Secondary | ICD-10-CM | POA: Diagnosis not present

## 2020-05-16 DIAGNOSIS — R2681 Unsteadiness on feet: Secondary | ICD-10-CM | POA: Diagnosis not present

## 2020-05-16 DIAGNOSIS — M25511 Pain in right shoulder: Secondary | ICD-10-CM | POA: Diagnosis not present

## 2020-05-16 DIAGNOSIS — R5383 Other fatigue: Secondary | ICD-10-CM | POA: Diagnosis not present

## 2020-05-16 DIAGNOSIS — I10 Essential (primary) hypertension: Secondary | ICD-10-CM

## 2020-05-16 NOTE — Therapy (Signed)
[place], [place], [address] Phone: [phone]   Fax:  [phone]  Physical Therapy Treatment  Patient Details  Name: Amber Rose MRN: [id number] Date of Birth: 02-22-1976 Referring Provider (PT): Dr. Charlott Rakes   Encounter Date: 05/16/2020   PT End of Session - 05/16/20 0923    Visit Number 3    Number of Visits 12    Date for PT Re-Evaluation 06/07/20    Authorization Type Aetna [facility]    PT Start Time 5956    PT Stop Time 0935    PT Time Calculation (min) 40 min    Activity Tolerance Patient tolerated treatment well;Patient limited by pain    Behavior During Therapy [facility] for tasks assessed/performed           Past Medical History:  Diagnosis Date  . Allergy   . Anxiety   . Arthritis   . Chronic UTI   . Depression   . Diabetes mellitus without complication ([place])   . Diverticulosis   . Fibromyalgia   . GERD (gastroesophageal reflux disease)   . Hiatal hernia   . Hyperlipidemia   . Hypertension   . Internal hemorrhoids   . Meningitis, viral   . Pancreatitis     Past Surgical History:  Procedure Laterality Date  . APPENDECTOMY  1990  . ceasarian  2002  . CESAREAN SECTION     x1  . CHOLECYSTECTOMY  2011  . COLONOSCOPY    . UPPER GASTROINTESTINAL ENDOSCOPY    . URINARY SURGERY     urethra sling, removal, and then revision 2016 (4 surgeries)  . WISDOM TOOTH EXTRACTION      There were no vitals filed for this visit.   Subjective Assessment - 05/16/20 0856    Subjective Stiff and hurt. 6/10 pain    Currently in Pain? Yes    Pain Score 6     Pain Location Hip    Pain Orientation Left    Pain Descriptors / Indicators Aching    Pain Type Chronic pain    Pain Onset More than a month ago    Pain Frequency Constant    Multiple Pain Sites No                             [facility] Adult PT Treatment/Exercise - 05/16/20 0001      Lumbar Exercises: Standing    Other Standing Lumbar Exercises hip flexor stretch holding rail 2x30 sec bil      Lumbar Exercises: Seated   Other Seated Lumbar Exercises lumbar forward bend stretch 3 x 15 sec followed by diagonally 3x15 sec      Lumbar Exercises: Supine   Other Supine Lumbar Exercises tilt with maximal verbal cues for technique x 20; tilt with march x 20; LTR 3x15 sec each side; Piriformis stretch 2x30 sec; L knee to opposite shoulder with towel 2x30 sec with pt diffficulty going into that diagonal pattern due to tightness and stiffness; L figure 4 position with L hip adduction x 15; tilt with bridge x 20; ball squeeze with tilt x 20; tilt with ball squeeze/bridge combo x 20; butterfly stretch 3x30 sec; long axis LE distraction x 10 with 5 sec hold alternating; LTR 3x30 sec bil      Lumbar Exercises: Sidelying   Other Sidelying Lumbar Exercises hip abdct with knees flexed x 10 with tilt      Lumbar Exercises: Prone  Other Prone Lumbar Exercises glute set x 20; glute set with alt hip ext x 10; donkey kick unilat bil x 10; prone heel squeeze 5 sec hold x 20      Lumbar Exercises: Quadruped   Other Quadruped Lumbar Exercises prayer stretch 1x30 sec,2x15 sec with pt difficulty maintaining at 3rd rep; cat/camel x 10                    PT Short Term Goals - 05/16/20 0917      PT SHORT TERM GOAL #1   Title Pt will be I with HEP    Time 3    Period Weeks    Status On-going    Target Date 05/17/20      PT SHORT TERM GOAL #2   Title Pt will report at least 25% improvement in getting her legs in and out of the car    Time 3    Period Weeks    Status On-going      PT SHORT TERM GOAL #3   Title Pt will be able to roll over in bed with 25% less difficulty    Period Weeks    Status On-going    Target Date 05/17/20             PT Long Term Goals - 04/26/20 1232      PT LONG TERM GOAL #1   Title Foto will demonstrate 50% limitation    Baseline 62% limitation    Time 6    Period  Weeks    Status New    Target Date 06/07/20      PT LONG TERM GOAL #2   Title Pt will demo improved bil LE strength >/= 4/5 all major muscle groups to assist with walking safely.    Baseline R: hip abdct 4-, hip flex 3+, knee ext 3, knee flex 3, DF 4. L  hip abdct 4-/5,  hip flx 2-, knee ext 2, knee flex 3-, DF 3+. Bil hip ext 4-    Time 6    Period Weeks    Status New    Target Date 06/07/20      PT LONG TERM GOAL #3   Title Pt will report stability when pivoting when she is walking 75% of the time    Baseline unmet    Time 6    Period Weeks    Status New    Target Date 06/07/20      PT LONG TERM GOAL #4   Title Pt will report improved functional strength to sit on the toilet with 75% less difficulty    Baseline 0%    Time 6    Period Weeks    Status New    Target Date 06/07/20      PT LONG TERM GOAL #5   Title Pt will demo improvement in bil hip IR >/= 15 to assist with mobility    Baseline R 10 degrees and L 6 degrees    Time 6    Period Weeks    Status New    Target Date 06/07/20                 Plan - 05/16/20 0912    Clinical Impression Statement Pt presents to PT with continued c/o hip stiffiness L > R. She reports no significant change in pain. She prefers prone positioning. Pt does not have a lot of hip mobility of either hip but left is more  limited than right especially with knee to opp shoulder stretch. Pt would continue to benefit from PT for hip flexibility and pain reduction L > R along with lumbar flexibility. She had difficulty with cat/camel and prayer stretch due to flexibility limitations.    Rehab Potential Fair    PT Frequency 2x / week    PT Duration 6 weeks    PT Treatment/Interventions ADLs/Self Care Home Management;Cryotherapy;Ultrasound;Traction;Moist Heat;Electrical Stimulation;Gait training;Stair training;Functional mobility training;Neuromuscular re-education;Balance training;Therapeutic exercise;Therapeutic activities;Patient/family  education;Manual techniques;Passive range of motion;Dry needling    PT Next Visit Plan Berg; continue to work on lumbar/hip flexibility and strengthening with emphasis on L > R    Consulted and Agree with Plan of Care Patient           Patient will benefit from skilled therapeutic intervention in order to improve the following deficits and impairments:  Abnormal gait, Decreased balance, Decreased endurance, Decreased mobility, Difficulty walking, Impaired sensation, Decreased range of motion, Decreased activity tolerance, Decreased strength, Impaired flexibility, Pain  Visit Diagnosis: Muscle weakness (generalized)     Problem List Patient Active Problem List   Diagnosis Date Noted  . History of herpes zoster 01/17/2018  . Overactive bladder 12/28/2017  . Recurrent infections 11/19/2017  . Cyst of nasal cavity 11/19/2017  . Angio-edema 11/19/2017  . Abdominal pain, epigastric 11/04/2017  . Dysphagia 11/04/2017  . Gastroesophageal reflux disease 07/12/2017  . Degenerative disc disease at L5-S1 level 05/14/2017  . Fibromyalgia 05/14/2017  . ADD (attention deficit disorder) 05/14/2017  . Urinary incontinence 04/05/2017  . Diabetes mellitus without complication ([place]) 05/39/7673  . Thigh pain 02/02/2017  . Anxiety and depression 02/02/2017  . Current smoker 02/02/2017  . Facial flushing 02/02/2017  . Hypertension 10/23/2016  . Hidradenitis 10/23/2016    Myra Rude, PT 05/16/2020, 9:36 AM  [facility] [address] [place], [place], [address] Phone: [phone]   Fax:  [phone]  Name: Ki Luckman MRN: [id number] Date of Birth: 25-Oct-1976

## 2020-05-24 ENCOUNTER — Encounter: Payer: Self-pay | Admitting: Rehabilitative and Restorative Service Providers"

## 2020-05-24 ENCOUNTER — Other Ambulatory Visit: Payer: Self-pay

## 2020-05-24 ENCOUNTER — Ambulatory Visit: Payer: Medicare HMO | Admitting: Rehabilitative and Restorative Service Providers"

## 2020-05-24 DIAGNOSIS — G8929 Other chronic pain: Secondary | ICD-10-CM | POA: Diagnosis not present

## 2020-05-24 DIAGNOSIS — M6281 Muscle weakness (generalized): Secondary | ICD-10-CM | POA: Diagnosis not present

## 2020-05-24 DIAGNOSIS — M25551 Pain in right hip: Secondary | ICD-10-CM | POA: Diagnosis not present

## 2020-05-24 DIAGNOSIS — M25552 Pain in left hip: Secondary | ICD-10-CM

## 2020-05-24 DIAGNOSIS — R2681 Unsteadiness on feet: Secondary | ICD-10-CM | POA: Diagnosis not present

## 2020-05-24 DIAGNOSIS — M25511 Pain in right shoulder: Secondary | ICD-10-CM | POA: Diagnosis not present

## 2020-05-24 NOTE — Therapy (Signed)
[name], [place], [address] Phone: [phone]   Fax:  [phone]  Physical Therapy Treatment  Patient Details  Name: Amber Rose MRN: [id number] Date of Birth: 1976/07/09 Referring Provider (PT): Dr. Charlott Rakes   Encounter Date: 05/24/2020   PT End of Session - 05/24/20 1029    Visit Number 4    Number of Visits 12    Date for PT Re-Evaluation 06/07/20    Authorization Type [employer] [facility]    PT Start Time 1019    PT Stop Time 1102    PT Time Calculation (min) 43 min    Activity Tolerance Patient tolerated treatment well;No increased pain    Behavior During Therapy WFL for tasks assessed/performed           Past Medical History:  Diagnosis Date  . Allergy   . Anxiety   . Arthritis   . Chronic UTI   . Depression   . Diabetes mellitus without complication ([place])   . Diverticulosis   . Fibromyalgia   . GERD (gastroesophageal reflux disease)   . Hiatal hernia   . Hyperlipidemia   . Hypertension   . Internal hemorrhoids   . Meningitis, viral   . Pancreatitis     Past Surgical History:  Procedure Laterality Date  . APPENDECTOMY  1990  . ceasarian  2002  . CESAREAN SECTION     x1  . CHOLECYSTECTOMY  2011  . COLONOSCOPY    . UPPER GASTROINTESTINAL ENDOSCOPY    . URINARY SURGERY     urethra sling, removal, and then revision 2016 (4 surgeries)  . WISDOM TOOTH EXTRACTION      There were no vitals filed for this visit.   Subjective Assessment - 05/24/20 1030    Subjective It's better but it is definitely still there. I have flareups. The Dr wants a second opinion    How long can you sit comfortably? 1 hour    How long can you stand comfortably? 10 min    How long can you walk comfortably? 1/2 block    Patient Stated Goals move without pain    Currently in Pain? Yes    Pain Score 5     Pain Location Hip    Pain Orientation Left    Pain Descriptors / Indicators Aching    Pain  Type Chronic pain    Pain Radiating Towards inner thigh    Pain Onset More than a month ago    Pain Frequency Intermittent    Aggravating Factors  any mobility    Pain Relieving Factors rest    Effect of Pain on Daily Activities hinders activity    Multiple Pain Sites No                             [facility] Adult PT Treatment/Exercise - 05/24/20 0001      Lumbar Exercises: Standing   Other Standing Lumbar Exercises hip flexor stretch 2x20 sec; standing ITB stretch against the wall 2x20 sec each      Lumbar Exercises: Supine   Other Supine Lumbar Exercises tilt x 20; tilt with march x 20; tilt with alternating march with iso hold with legs up at 90 degrees; butterfly stretch 3x30 sec; tilt with SLR x 20 unilat bil; butterfly stretch with oblique reach down while maintaining tilt; tilt with ball squeeze x 20      Lumbar Exercises: Prone  Other Prone Lumbar Exercises prone heel squeeze x 20; 3 lb hamstring curl unilat bil x 20; 3 lb donkey kick unil bil x 20 each; graded cobra on elbows 2x30 sec       Lumbar Exercises: Quadruped   Other Quadruped Lumbar Exercises cat/camel x 10 with PT verbal cues for cat technique; prayer stretch in increments for total of about 3 minutes with stretch and graded relaxation                  PT Education - 05/24/20 1035    Education Details discussed symptoms and rheumatology/neurology referral; pt to contact MD    Person(s) Educated Patient    Methods Explanation    Comprehension Verbalized understanding            PT Short Term Goals - 05/24/20 1040      PT SHORT TERM GOAL #1   Title Pt will be I with HEP    Time 3    Period Weeks    Status Achieved      PT SHORT TERM GOAL #2   Title Pt will report at least 25% improvement in getting her legs in and out of the car    Status Achieved      PT SHORT TERM GOAL #3   Title Pt will be able to roll over in bed with 25% less difficulty    Time 3    Period Weeks     Status On-going    Target Date 06/07/20             PT Long Term Goals - 04/26/20 1232      PT LONG TERM GOAL #1   Title Foto will demonstrate 50% limitation    Baseline 62% limitation    Time 6    Period Weeks    Status New    Target Date 06/07/20      PT LONG TERM GOAL #2   Title Pt will demo improved bil LE strength >/= 4/5 all major muscle groups to assist with walking safely.    Baseline R: hip abdct 4-, hip flex 3+, knee ext 3, knee flex 3, DF 4. L  hip abdct 4-/5,  hip flx 2-, knee ext 2, knee flex 3-, DF 3+. Bil hip ext 4-    Time 6    Period Weeks    Status New    Target Date 06/07/20      PT LONG TERM GOAL #3   Title Pt will report stability when pivoting when she is walking 75% of the time    Baseline unmet    Time 6    Period Weeks    Status New    Target Date 06/07/20      PT LONG TERM GOAL #4   Title Pt will report improved functional strength to sit on the toilet with 75% less difficulty    Baseline 0%    Time 6    Period Weeks    Status New    Target Date 06/07/20      PT LONG TERM GOAL #5   Title Pt will demo improvement in bil hip IR >/= 15 to assist with mobility    Baseline R 10 degrees and L 6 degrees    Time 6    Period Weeks    Status New    Target Date 06/07/20                 Plan -  05/24/20 1036    Clinical Impression Statement Pt continues to present to PT with c/o hip stiffness and achiness L > R with flareups. She reports doing water aerobics 2x/week as well. Pt would benefit from further PT for tilt therex, hip flexibility therex L > R, and pain relief modalities as needed especially to hips L > R. Continue to work on cat/camel and prayer stretch flexibility as needed.    Rehab Potential Fair    PT Frequency 2x / week    PT Duration 6 weeks    PT Treatment/Interventions ADLs/Self Care Home Management;Cryotherapy;Ultrasound;Traction;Moist Heat;Electrical Stimulation;Gait training;Stair training;Functional mobility  training;Neuromuscular re-education;Balance training;Therapeutic exercise;Therapeutic activities;Patient/family education;Manual techniques;Passive range of motion;Dry needling    PT Next Visit Plan Berg; continue to work on lumbar/hip flexibility and strengthening with emphasis on L > R    Consulted and Agree with Plan of Care Patient           Patient will benefit from skilled therapeutic intervention in order to improve the following deficits and impairments:  Abnormal gait, Decreased balance, Decreased endurance, Decreased mobility, Difficulty walking, Impaired sensation, Decreased range of motion, Decreased activity tolerance, Decreased strength, Impaired flexibility, Pain  Visit Diagnosis: Muscle weakness (generalized)  Pain in left hip     Problem List Patient Active Problem List   Diagnosis Date Noted  . History of herpes zoster 01/17/2018  . Overactive bladder 12/28/2017  . Recurrent infections 11/19/2017  . Cyst of nasal cavity 11/19/2017  . Angio-edema 11/19/2017  . Abdominal pain, epigastric 11/04/2017  . Dysphagia 11/04/2017  . Gastroesophageal reflux disease 07/12/2017  . Degenerative disc disease at L5-S1 level 05/14/2017  . Fibromyalgia 05/14/2017  . ADD (attention deficit disorder) 05/14/2017  . Urinary incontinence 04/05/2017  . Diabetes mellitus without complication ([place]) 59/74/1638  . Thigh pain 02/02/2017  . Anxiety and depression 02/02/2017  . Current smoker 02/02/2017  . Facial flushing 02/02/2017  . Hypertension 10/23/2016  . Hidradenitis 10/23/2016    Myra Rude, PT 05/24/2020, 11:04 AM  [facility] [address] [place], [place], [address] Phone: [phone]   Fax:  [phone]  Name: Amber Rose MRN: [id number] Date of Birth: [birth year]

## 2020-05-29 ENCOUNTER — Ambulatory Visit: Payer: Medicare HMO | Admitting: Rehabilitative and Restorative Service Providers"

## 2020-06-14 ENCOUNTER — Other Ambulatory Visit: Payer: Self-pay

## 2020-06-14 ENCOUNTER — Ambulatory Visit: Payer: Medicare HMO | Attending: Family Medicine | Admitting: Physical Therapy

## 2020-06-14 DIAGNOSIS — R2681 Unsteadiness on feet: Secondary | ICD-10-CM | POA: Insufficient documentation

## 2020-06-14 DIAGNOSIS — M25511 Pain in right shoulder: Secondary | ICD-10-CM | POA: Insufficient documentation

## 2020-06-14 DIAGNOSIS — G8929 Other chronic pain: Secondary | ICD-10-CM | POA: Insufficient documentation

## 2020-06-14 DIAGNOSIS — M6281 Muscle weakness (generalized): Secondary | ICD-10-CM | POA: Diagnosis not present

## 2020-06-14 DIAGNOSIS — M25551 Pain in right hip: Secondary | ICD-10-CM | POA: Insufficient documentation

## 2020-06-14 DIAGNOSIS — M25552 Pain in left hip: Secondary | ICD-10-CM | POA: Diagnosis not present

## 2020-06-14 NOTE — Therapy (Signed)
[place], [place], [address] Phone: [phone]   Fax:  [phone]  Physical Therapy Treatment/Recert   Patient Details  Name: Amber Rose MRN: [id number] Date of Birth: 1976/10/01 Referring Provider (PT): Dr. Charlott Rakes   Encounter Date: 06/14/2020   PT End of Session - 06/14/20 1102    Visit Number 5    Number of Visits 12    Date for PT Re-Evaluation 07/12/20    Authorization Type [employer] MCR    PT Start Time 1103    PT Stop Time 4166    PT Time Calculation (min) 42 min    Activity Tolerance Patient tolerated treatment well;No increased pain    Behavior During Therapy WFL for tasks assessed/performed           Past Medical History:  Diagnosis Date  . Allergy   . Anxiety   . Arthritis   . Chronic UTI   . Depression   . Diabetes mellitus without complication ([place])   . Diverticulosis   . Fibromyalgia   . GERD (gastroesophageal reflux disease)   . Hiatal hernia   . Hyperlipidemia   . Hypertension   . Internal hemorrhoids   . Meningitis, viral   . Pancreatitis     Past Surgical History:  Procedure Laterality Date  . APPENDECTOMY  1990  . ceasarian  2002  . CESAREAN SECTION     x1  . CHOLECYSTECTOMY  2011  . COLONOSCOPY    . UPPER GASTROINTESTINAL ENDOSCOPY    . URINARY SURGERY     urethra sling, removal, and then revision 2016 (4 surgeries)  . WISDOM TOOTH EXTRACTION      There were no vitals filed for this visit.       [facility] PT Assessment - 06/14/20 0001      Observation/Other Assessments   Focus on Therapeutic Outcomes (FOTO)  61% LIMITATION       Sensation   Additional Comments CONTINUES TO HAVE SPASMS INTO HER LEGS       AROM   AROM Assessment Site Lumbar      PROM   Overall PROM Comments left hip is the same; right hip has improved toi full with pain at end range.       Strength   Strength Assessment Site Hip;Knee    Right/Left Hip Right;Left     Right Hip Flexion 3+/5    Right Hip ABduction 4+/5    Left Hip Flexion 3+/5    Left Hip ABduction 4+/5    Right/Left Knee Right;Left    Right Knee Flexion 3+/5    Right Knee Extension 3+/5    Left Knee Flexion 3+/5    Left Knee Extension 3+/5                         [facility] Adult PT Treatment/Exercise - 06/14/20 0001      Self-Care   Other Self-Care Comments  reviewed POC going forwar. Reviewed results of testing and where we are going to focus on the next 4 weeks       Lumbar Exercises: Stretches   Other Lumbar Stretch Exercise single knee to chest 3x20 sec hold       Lumbar Exercises: Standing   Other Standing Lumbar Exercises standing hip flexion 2x10;        Lumbar Exercises: Seated   Other Seated Lumbar Exercises seated clamshell 2x10 red       Manual Therapy  Manual Therapy Manual Traction;Joint mobilization    Joint Mobilization PA mobilization grade III and IV     Manual Traction Grade II and III left leg glide with occilations                     PT Short Term Goals - 05/24/20 1040      PT SHORT TERM GOAL #1   Title Pt will be I with HEP    Time 3    Period Weeks    Status Achieved      PT SHORT TERM GOAL #2   Title Pt will report at least 25% improvement in getting her legs in and out of the car    Status Achieved      PT SHORT TERM GOAL #3   Title Pt will be able to roll over in bed with 25% less difficulty    Time 3    Period Weeks    Status On-going    Target Date 06/07/20             PT Long Term Goals - 04/26/20 1232      PT LONG TERM GOAL #1   Title Foto will demonstrate 50% limitation    Baseline 62% limitation    Time 6    Period Weeks    Status New    Target Date 06/07/20      PT LONG TERM GOAL #2   Title Pt will demo improved bil LE strength >/= 4/5 all major muscle groups to assist with walking safely.    Baseline R: hip abdct 4-, hip flex 3+, knee ext 3, knee flex 3, DF 4. L  hip abdct 4-/5,  hip flx 2-,  knee ext 2, knee flex 3-, DF 3+. Bil hip ext 4-    Time 6    Period Weeks    Status New    Target Date 06/07/20      PT LONG TERM GOAL #3   Title Pt will report stability when pivoting when she is walking 75% of the time    Baseline unmet    Time 6    Period Weeks    Status New    Target Date 06/07/20      PT LONG TERM GOAL #4   Title Pt will report improved functional strength to sit on the toilet with 75% less difficulty    Baseline 0%    Time 6    Period Weeks    Status New    Target Date 06/07/20      PT LONG TERM GOAL #5   Title Pt will demo improvement in bil hip IR >/= 15 to assist with mobility    Baseline R 10 degrees and L 6 degrees    Time 6    Period Weeks    Status New    Target Date 06/07/20                 Plan - 06/14/20 1152    Clinical Impression Statement Patient has made progress with strength and right hip motion. Her left hip continues to be painful and limited with flexion. Therapy worked on manual therapy to improve hip flexion,. She was able to move past 90 degrees with manual therapy. She was given a hip flexion stretch to work on at home. Her strength has progressed. She would benefit from further skilled therapy 1W4    Personal Factors and Comorbidities Social Background  Examination-Activity Limitations Bathing;Squat;Bed Mobility;Stairs;Locomotion Level;Bend;Stand;Toileting;Transfers;Sit;Dressing    Examination-Participation Restrictions Laundry;Shop;Cleaning;Community Activity;Driving    Stability/Clinical Decision Making Stable/Uncomplicated    Clinical Decision Making Low    Rehab Potential Fair    PT Frequency 2x / week    PT Duration 6 weeks    PT Treatment/Interventions ADLs/Self Care Home Management;Cryotherapy;Ultrasound;Traction;Moist Heat;Electrical Stimulation;Gait training;Stair training;Functional mobility training;Neuromuscular re-education;Balance training;Therapeutic exercise;Therapeutic activities;Patient/family  education;Manual techniques;Passive range of motion;Dry needling    PT Next Visit Plan Berg; continue to work on lumbar/hip flexibility and strengthening with emphasis on L > R    PT Home Exercise Plan lower trunk rotation, piriformis stretch, hip flexor stretch, seated hamstring stretch, ab sets, supine marches, self trigger point release with tennis ball    Consulted and Agree with Plan of Care Patient           Patient will benefit from skilled therapeutic intervention in order to improve the following deficits and impairments:  Abnormal gait, Decreased balance, Decreased endurance, Decreased mobility, Difficulty walking, Impaired sensation, Decreased range of motion, Decreased activity tolerance, Decreased strength, Impaired flexibility, Pain  Visit Diagnosis: Muscle weakness (generalized)  Pain in left hip  Pain in right hip  Unsteadiness on feet  Chronic right shoulder pain     Problem List Patient Active Problem List   Diagnosis Date Noted  . History of herpes zoster 01/17/2018  . Overactive bladder 12/28/2017  . Recurrent infections 11/19/2017  . Cyst of nasal cavity 11/19/2017  . Angio-edema 11/19/2017  . Abdominal pain, epigastric 11/04/2017  . Dysphagia 11/04/2017  . Gastroesophageal reflux disease 07/12/2017  . Degenerative disc disease at L5-S1 level 05/14/2017  . Fibromyalgia 05/14/2017  . ADD (attention deficit disorder) 05/14/2017  . Urinary incontinence 04/05/2017  . Diabetes mellitus without complication ([place]) 16/60/6301  . Thigh pain 02/02/2017  . Anxiety and depression 02/02/2017  . Current smoker 02/02/2017  . Facial flushing 02/02/2017  . Hypertension 10/23/2016  . Hidradenitis 10/23/2016    Carney Living PT DPT  06/14/2020, 12:13 PM  [facility] [address] [place], [place], [address] Phone: [phone]   Fax:  [phone]  Name: Amber Rose MRN: [id number] Date of Birth:  12-Dec-1975

## 2020-06-17 ENCOUNTER — Other Ambulatory Visit: Payer: Self-pay | Admitting: Family Medicine

## 2020-06-17 DIAGNOSIS — I10 Essential (primary) hypertension: Secondary | ICD-10-CM

## 2020-06-18 ENCOUNTER — Other Ambulatory Visit: Payer: Self-pay | Admitting: Family Medicine

## 2020-06-19 ENCOUNTER — Ambulatory Visit: Payer: Medicare HMO | Admitting: Physical Therapy

## 2020-06-19 ENCOUNTER — Other Ambulatory Visit: Payer: Self-pay

## 2020-06-19 DIAGNOSIS — M6281 Muscle weakness (generalized): Secondary | ICD-10-CM | POA: Diagnosis not present

## 2020-06-19 DIAGNOSIS — M25551 Pain in right hip: Secondary | ICD-10-CM | POA: Diagnosis not present

## 2020-06-19 DIAGNOSIS — R2681 Unsteadiness on feet: Secondary | ICD-10-CM | POA: Diagnosis not present

## 2020-06-19 DIAGNOSIS — M25511 Pain in right shoulder: Secondary | ICD-10-CM | POA: Diagnosis not present

## 2020-06-19 DIAGNOSIS — M25552 Pain in left hip: Secondary | ICD-10-CM | POA: Diagnosis not present

## 2020-06-19 DIAGNOSIS — G8929 Other chronic pain: Secondary | ICD-10-CM

## 2020-06-19 NOTE — Therapy (Addendum)
[place], [place], [address] Phone: [phone]   Fax:  [phone]  Physical Therapy Treatment/Discharge   Patient Details  Name: Amber Rose MRN: [id number] Date of Birth: 01/26/1976 Referring Provider (PT): Dr. Charlott Rakes   Encounter Date: 06/19/2020   PT End of Session - 06/19/20 1016    Visit Number 6    Number of Visits 12    Date for PT Re-Evaluation 07/12/20    Authorization Type [employer] M[employer]    PT Start Time 4585    PT Stop Time 1055    PT Time Calculation (min) 40 min    Activity Tolerance Patient tolerated treatment well;No increased pain    Behavior During Therapy WFL for tasks assessed/performed           Past Medical History:  Diagnosis Date  . Allergy   . Anxiety   . Arthritis   . Chronic UTI   . Depression   . Diabetes mellitus without complication ([place])   . Diverticulosis   . Fibromyalgia   . GERD (gastroesophageal reflux disease)   . Hiatal hernia   . Hyperlipidemia   . Hypertension   . Internal hemorrhoids   . Meningitis, viral   . Pancreatitis     Past Surgical History:  Procedure Laterality Date  . APPENDECTOMY  1990  . ceasarian  2002  . CESAREAN SECTION     x1  . CHOLECYSTECTOMY  2011  . COLONOSCOPY    . UPPER GASTROINTESTINAL ENDOSCOPY    . URINARY SURGERY     urethra sling, removal, and then revision 2016 (4 surgeries)  . WISDOM TOOTH EXTRACTION      There were no vitals filed for this visit.   Subjective Assessment - 06/19/20 1020    Subjective Patient feels like she is tight when she turns certain ways. She feels loike it causes pain down her legs but overall it is better.    Pertinent History has been on Steroids recently but finishing dosage. burning is in the thigh. been going on for several years without known onset. Diabetic    How long can you sit comfortably? 1 hour    How long can you stand comfortably? 10 min    How long can you  walk comfortably? 1/2 block    Patient Stated Goals move without pain    Currently in Pain? Yes   not really pain just tightness   Pain Score 5     Pain Location Hip    Pain Orientation Left    Pain Descriptors / Indicators Aching    Pain Type Chronic pain    Pain Onset More than a month ago    Pain Frequency Intermittent    Aggravating Factors  any mobility    Pain Relieving Factors rest    Effect of Pain on Daily Activities hinders activity                             [facility] Adult PT Treatment/Exercise - 06/19/20 0001      Lumbar Exercises: Stretches   Lower Trunk Rotation Limitations x30 sec     Piriformis Stretch 2 reps;30 seconds;Right;Left      Lumbar Exercises: Standing   Other Standing Lumbar Exercises standing hip flexion 2x10;      Other Standing Lumbar Exercises standing hip abduction 2x10; standing hip extension 2x10 bilateral cuing for technique with each.  Lumbar Exercises: Supine   Bridge Limitations 2x10     Other Supine Lumbar Exercises supine march with core contraction x15 each leg       Manual Therapy   Joint Mobilization PA mobilization grade III and IV     Manual Traction Grade II and III left leg glide with occilations                     PT Short Term Goals - 05/24/20 1040      PT SHORT TERM GOAL #1   Title Pt will be I with HEP    Time 3    Period Weeks    Status Achieved      PT SHORT TERM GOAL #2   Title Pt will report at least 25% improvement in getting her legs in and out of the car    Status Achieved      PT SHORT TERM GOAL #3   Title Pt will be able to roll over in bed with 25% less difficulty    Time 3    Period Weeks    Status On-going    Target Date 06/07/20             PT Long Term Goals - 04/26/20 1232      PT LONG TERM GOAL #1   Title Foto will demonstrate 50% limitation    Baseline 62% limitation    Time 6    Period Weeks    Status New    Target Date 06/07/20      PT LONG TERM  GOAL #2   Title Pt will demo improved bil LE strength >/= 4/5 all major muscle groups to assist with walking safely.    Baseline R: hip abdct 4-, hip flex 3+, knee ext 3, knee flex 3, DF 4. L  hip abdct 4-/5,  hip flx 2-, knee ext 2, knee flex 3-, DF 3+. Bil hip ext 4-    Time 6    Period Weeks    Status New    Target Date 06/07/20      PT LONG TERM GOAL #3   Title Pt will report stability when pivoting when she is walking 75% of the time    Baseline unmet    Time 6    Period Weeks    Status New    Target Date 06/07/20      PT LONG TERM GOAL #4   Title Pt will report improved functional strength to sit on the toilet with 75% less difficulty    Baseline 0%    Time 6    Period Weeks    Status New    Target Date 06/07/20      PT LONG TERM GOAL #5   Title Pt will demo improvement in bil hip IR >/= 15 to assist with mobility    Baseline R 10 degrees and L 6 degrees    Time 6    Period Weeks    Status New    Target Date 06/07/20                 Plan - 06/19/20 1049    Clinical Impression Statement Patient hip mobility has progressed significantly since last visit. Per visual inspection she had nearly full mobility.of her left hip today. She was given more advanced standing exercises to work on at home. Therapy will continue to progress the patient as tolerated. She was given an updated HEP.  Personal Factors and Comorbidities Social Background    Examination-Activity Limitations Bathing;Squat;Bed Mobility;Stairs;Locomotion Level;Bend;Stand;Toileting;Transfers;Sit;Dressing    Stability/Clinical Decision Making Stable/Uncomplicated    Clinical Decision Making Low    Rehab Potential Fair    PT Frequency 2x / week    PT Duration 6 weeks    PT Treatment/Interventions ADLs/Self Care Home Management;Cryotherapy;Ultrasound;Traction;Moist Heat;Electrical Stimulation;Gait training;Stair training;Functional mobility training;Neuromuscular re-education;Balance training;Therapeutic  exercise;Therapeutic activities;Patient/family education;Manual techniques;Passive range of motion;Dry needling    PT Next Visit Plan [place]; continue to work on lumbar/hip flexibility and strengthening with emphasis on L > R    PT Home Exercise Plan lower trunk rotation, piriformis stretch, hip flexor stretch, seated hamstring stretch, ab sets, supine marches, self trigger point release with tennis ball    Consulted and Agree with Plan of Care Patient           Patient will benefit from skilled therapeutic intervention in order to improve the following deficits and impairments:  Abnormal gait, Decreased balance, Decreased endurance, Decreased mobility, Difficulty walking, Impaired sensation, Decreased range of motion, Decreased activity tolerance, Decreased strength, Impaired flexibility, Pain  Visit Diagnosis: Muscle weakness (generalized)  Pain in left hip  Pain in right hip  Unsteadiness on feet  Chronic right shoulder pain     Problem List Patient Active Problem List   Diagnosis Date Noted  . History of herpes zoster 01/17/2018  . Overactive bladder 12/28/2017  . Recurrent infections 11/19/2017  . Cyst of nasal cavity 11/19/2017  . Angio-edema 11/19/2017  . Abdominal pain, epigastric 11/04/2017  . Dysphagia 11/04/2017  . Gastroesophageal reflux disease 07/12/2017  . Degenerative disc disease at L5-S1 level 05/14/2017  . Fibromyalgia 05/14/2017  . ADD (attention deficit disorder) 05/14/2017  . Urinary incontinence 04/05/2017  . Diabetes mellitus without complication ([place]) 26/20/3559  . Thigh pain 02/02/2017  . Anxiety and depression 02/02/2017  . Current smoker 02/02/2017  . Facial flushing 02/02/2017  . Hypertension 10/23/2016  . Hidradenitis 10/23/2016    PHYSICAL THERAPY DISCHARGE SUMMARY  Visits from Start of Care: 6  Current functional level related to goals / functional outcomes: Improved pain but still having pain reaching   Remaining deficits: Pain  with reaching    Education / Equipment: HEP   Plan: Patient agrees to discharge.  Patient goals were not met. Patient is being discharged due to not returning since the last visit.  ?????       Carney Living PT DPT  06/19/2020, 10:59 AM  [facility] [address] [place], [place], [address] Phone: [phone]   Fax:  [phone]  Name: Amber Rose MRN: [id number] Date of Birth: 11/15/1975

## 2020-06-28 ENCOUNTER — Ambulatory Visit: Payer: Medicare HMO | Admitting: Physical Therapy

## 2020-07-02 ENCOUNTER — Encounter: Payer: Self-pay | Admitting: Family Medicine

## 2020-07-02 ENCOUNTER — Other Ambulatory Visit: Payer: Self-pay

## 2020-07-02 ENCOUNTER — Ambulatory Visit: Payer: Medicare HMO | Attending: Family Medicine | Admitting: Family Medicine

## 2020-07-02 VITALS — BP 116/73 | HR 87 | Ht 63.0 in | Wt 192.0 lb

## 2020-07-02 DIAGNOSIS — E1149 Type 2 diabetes mellitus with other diabetic neurological complication: Secondary | ICD-10-CM

## 2020-07-02 DIAGNOSIS — R69 Illness, unspecified: Secondary | ICD-10-CM | POA: Diagnosis not present

## 2020-07-02 DIAGNOSIS — Z1159 Encounter for screening for other viral diseases: Secondary | ICD-10-CM | POA: Diagnosis not present

## 2020-07-02 DIAGNOSIS — Z1231 Encounter for screening mammogram for malignant neoplasm of breast: Secondary | ICD-10-CM

## 2020-07-02 DIAGNOSIS — Z8619 Personal history of other infectious and parasitic diseases: Secondary | ICD-10-CM | POA: Diagnosis not present

## 2020-07-02 DIAGNOSIS — M79651 Pain in right thigh: Secondary | ICD-10-CM

## 2020-07-02 DIAGNOSIS — F32A Depression, unspecified: Secondary | ICD-10-CM

## 2020-07-02 DIAGNOSIS — E559 Vitamin D deficiency, unspecified: Secondary | ICD-10-CM | POA: Diagnosis not present

## 2020-07-02 DIAGNOSIS — I10 Essential (primary) hypertension: Secondary | ICD-10-CM | POA: Diagnosis not present

## 2020-07-02 DIAGNOSIS — F329 Major depressive disorder, single episode, unspecified: Secondary | ICD-10-CM

## 2020-07-02 DIAGNOSIS — E785 Hyperlipidemia, unspecified: Secondary | ICD-10-CM | POA: Diagnosis not present

## 2020-07-02 DIAGNOSIS — R202 Paresthesia of skin: Secondary | ICD-10-CM | POA: Diagnosis not present

## 2020-07-02 DIAGNOSIS — Z0000 Encounter for general adult medical examination without abnormal findings: Secondary | ICD-10-CM

## 2020-07-02 DIAGNOSIS — M79652 Pain in left thigh: Secondary | ICD-10-CM

## 2020-07-02 DIAGNOSIS — F419 Anxiety disorder, unspecified: Secondary | ICD-10-CM

## 2020-07-02 DIAGNOSIS — M797 Fibromyalgia: Secondary | ICD-10-CM | POA: Diagnosis not present

## 2020-07-02 LAB — POCT GLYCOSYLATED HEMOGLOBIN (HGB A1C): HbA1c, POC (controlled diabetic range): 6.8 % (ref 0.0–7.0)

## 2020-07-02 LAB — GLUCOSE, POCT (MANUAL RESULT ENTRY): POC Glucose: 156 mg/dL — AB (ref 70–99)

## 2020-07-02 MED ORDER — ATORVASTATIN CALCIUM 20 MG PO TABS: 20.0000 mg | ORAL_TABLET | Freq: Every day | ORAL | 1 refills | Status: DC

## 2020-07-02 MED ORDER — ACYCLOVIR 400 MG PO TABS: 400.0000 mg | ORAL_TABLET | Freq: Two times a day (BID) | ORAL | 1 refills | Status: DC

## 2020-07-02 MED ORDER — PREGABALIN 75 MG PO CAPS: 75.0000 mg | ORAL_CAPSULE | Freq: Two times a day (BID) | ORAL | 3 refills | Status: DC

## 2020-07-02 MED ORDER — METHOCARBAMOL 750 MG PO TABS: 750.0000 mg | ORAL_TABLET | Freq: Three times a day (TID) | ORAL | 1 refills | Status: DC | PRN

## 2020-07-02 MED ORDER — HYDROXYZINE HCL 25 MG PO TABS: 25.0000 mg | ORAL_TABLET | Freq: Three times a day (TID) | ORAL | 1 refills | Status: DC | PRN

## 2020-07-02 MED ORDER — DULOXETINE HCL 60 MG PO CPEP: ORAL_CAPSULE | ORAL | 1 refills | Status: DC

## 2020-07-02 MED ORDER — AMLODIPINE BESYLATE 10 MG PO TABS: 10.0000 mg | ORAL_TABLET | Freq: Every day | ORAL | 1 refills | Status: DC

## 2020-07-02 MED ORDER — BYDUREON BCISE 2 MG/0.85ML ~~LOC~~ AUIJ: AUTO-INJECTOR | SUBCUTANEOUS | 1 refills | Status: DC

## 2020-07-02 NOTE — Progress Notes (Signed)
Referral to GYN for pap smear.  Wants to get mammogram done.

## 2020-07-02 NOTE — Patient Instructions (Signed)
Myofascial Pain Syndrome and Fibromyalgia Myofascial pain syndrome and fibromyalgia are both pain disorders. This pain may be felt mainly in your muscles.  Myofascial pain syndrome: ? Always has tender points in the muscle that will cause pain when pressed (trigger points). The pain may come and go. ? Usually affects your neck, upper back, and shoulder areas. The pain often radiates into your arms and hands.  Fibromyalgia: ? Has muscle pains and tenderness that come and go. ? Is often associated with fatigue and sleep problems. ? Has trigger points. ? Tends to be long-lasting (chronic), but is not life-threatening. Fibromyalgia and myofascial pain syndrome are not the same. However, they often occur together. If you have both conditions, each can make the other worse. Both are common and can cause enough pain and fatigue to make day-to-day activities difficult. Both can be hard to diagnose because their symptoms are common in many other conditions. What are the causes? The exact causes of these conditions are not known. What increases the risk? You are more likely to develop this condition if:  You have a family history of the condition.  You have certain triggers, such as: ? Spine disorders. ? An injury (trauma) or other physical stressors. ? Being under a lot of stress. ? Medical conditions such as osteoarthritis, rheumatoid arthritis, or lupus. What are the signs or symptoms? Fibromyalgia The main symptom of fibromyalgia is widespread pain and tenderness in your muscles. Pain is sometimes described as stabbing, shooting, or burning. You may also have:  Tingling or numbness.  Sleep problems and fatigue.  Problems with attention and concentration (fibro fog). Other symptoms may include:  Bowel and bladder problems.  Headaches.  Visual problems.  Problems with odors and noises.  Depression or mood changes.  Painful menstrual periods (dysmenorrhea).  Dry skin or  eyes. These symptoms can vary over time. Myofascial pain syndrome Symptoms of myofascial pain syndrome include:  Tight, ropy bands of muscle.  Uncomfortable sensations in muscle areas. These may include aching, cramping, burning, numbness, tingling, and weakness.  Difficulty moving certain parts of the body freely (poor range of motion). How is this diagnosed? This condition may be diagnosed by your symptoms and medical history. You will also have a physical exam. In general:  Fibromyalgia is diagnosed if you have pain, fatigue, and other symptoms for more than 3 months, and symptoms cannot be explained by another condition.  Myofascial pain syndrome is diagnosed if you have trigger points in your muscles, and those trigger points are tender and cause pain elsewhere in your body (referred pain). How is this treated? Treatment for these conditions depends on the type that you have.  For fibromyalgia: ? Pain medicines, such as NSAIDs. ? Medicines for treating depression. ? Medicines for treating seizures. ? Medicines that relax the muscles.  For myofascial pain: ? Pain medicines, such as NSAIDs. ? Cooling and stretching of muscles. ? Trigger point injections. ? Sound wave (ultrasound) treatments to stimulate muscles. Treating these conditions often requires a team of health care providers. These may include:  Your primary care provider.  Physical therapist.  Complementary health care providers, such as massage therapists or acupuncturists.  Psychiatrist for cognitive behavioral therapy. Follow these instructions at home: Medicines  Take over-the-counter and prescription medicines only as told by your health care provider.  Do not drive or use heavy machinery while taking prescription pain medicine.  If you are taking prescription pain medicine, take actions to prevent or treat constipation. Your health care   provider may recommend that you: ? Drink enough fluid to keep  your urine pale yellow. ? Eat foods that are high in fiber, such as fresh fruits and vegetables, whole grains, and beans. ? Limit foods that are high in fat and processed sugars, such as fried or sweet foods. ? Take an over-the-counter or prescription medicine for constipation. Lifestyle   Exercise as directed by your health care provider or physical therapist.  Practice relaxation techniques to control your stress. You may want to try: ? Biofeedback. ? Visual imagery. ? Hypnosis. ? Muscle relaxation. ? Yoga. ? Meditation.  Maintain a healthy lifestyle. This includes eating a healthy diet and getting enough sleep.  Do not use any products that contain nicotine or tobacco, such as cigarettes and e-cigarettes. If you need help quitting, ask your health care provider. General instructions  Talk to your health care provider about complementary treatments, such as acupuncture or massage.  Consider joining a support group with others who are diagnosed with this condition.  Do not do activities that stress or strain your muscles. This includes repetitive motions and heavy lifting.  Keep all follow-up visits as told by your health care provider. This is important. Where to find more information  National Fibromyalgia Association: www.fmaware.org  Arthritis Foundation: www.arthritis.org  American Chronic Pain Association: www.theacpa.org Contact a health care provider if:  You have new symptoms.  Your symptoms get worse or your pain is severe.  You have side effects from your medicines.  You have trouble sleeping.  Your condition is causing depression or anxiety. Summary  Myofascial pain syndrome and fibromyalgia are pain disorders.  Myofascial pain syndrome has tender points in the muscle that will cause pain when pressed (trigger points). Fibromyalgia also has muscle pains and tenderness that come and go, but this condition is often associated with fatigue and sleep  disturbances.  Fibromyalgia and myofascial pain syndrome are not the same but often occur together, causing pain and fatigue that make day-to-day activities difficult.  Treatment for fibromyalgia includes taking medicines to relax the muscles and medicines for pain, depression, or seizures. Treatment for myofascial pain syndrome includes taking medicines for pain, cooling and stretching of muscles, and injecting medicines into trigger points.  Follow your health care provider's instructions for taking medicines and maintaining a healthy lifestyle. This information is not intended to replace advice given to you by your health care provider. Make sure you discuss any questions you have with your health care provider. Document Revised: 02/03/2019 Document Reviewed: 10/27/2017 Elsevier Patient Education  2020 [employer]  

## 2020-07-02 NOTE — Progress Notes (Signed)
Subjective:  Patient ID: Amber Rose, female    DOB: [birth year]  Age: 44 y.o. MRN: [id number]  CC: Diabetes   HPI Amber Rose is a 44 year old female with type 2 diabetes mellitus (A1c 6.8), hypertension, depression and anxiety, hydradenitis, tobacco abuse, fibromyalgia seen for a follow up visit. She is starting to trip and is using a brace prescribed by physical therapy due to the fact that she has pain in her thighs and legs and her gait has been unstable.  Medications have not provided relief and she just continues physical therapy for the sake of 'doing something'.  Request completion of a handicap placard today. Her facial flushing also continues and symptoms are worse at the time of her cycles.  And she informs me some inflammation is going on. Her symptoms continue to remain frustrating to her and she has seen neurology, rheumatology, allergy and immunology with no improvement in her symptoms.  Anxiety and Depression are worsening with her pain and other symptoms which are uncontrolled Tingling, numbness weakness occur in her L hand; she is R handed. Gabapentin made her have brain fog in the past and she has been prescribed Cymbalta which seemed she last filled in 02/2019. We had discussed a second opinion and possibly referring her to [facility] pain management to see a specialist.  Her diabetes mellitus is controlled with her current medication and she denies hypoglycemic episodes. She requests a GYN referral for a Pap smear and would also like to be referred for mammogram. Past Medical History:  Diagnosis Date  . Allergy   . Anxiety   . Arthritis   . Chronic UTI   . Depression   . Diabetes mellitus without complication ([place])   . Diverticulosis   . Fibromyalgia   . GERD (gastroesophageal reflux disease)   . Hiatal hernia   . Hyperlipidemia   . Hypertension   . Internal hemorrhoids   . Meningitis, viral   . Pancreatitis     Past Surgical History:  Procedure  Laterality Date  . APPENDECTOMY  1990  . ceasarian  2002  . CESAREAN SECTION     x1  . CHOLECYSTECTOMY  2011  . COLONOSCOPY    . UPPER GASTROINTESTINAL ENDOSCOPY    . URINARY SURGERY     urethra sling, removal, and then revision 2016 (4 surgeries)  . WISDOM TOOTH EXTRACTION      Family History  Problem Relation Age of Onset  . Cancer Mother        cervical, breast, lungm skin, bladder-ex smoker  . Diabetes Father   . Multiple sclerosis Paternal Grandmother   . Allergic rhinitis Neg Hx   . Angioedema Neg Hx   . Asthma Neg Hx   . Eczema Neg Hx   . Urticaria Neg Hx   . Colon cancer Neg Hx   . Esophageal cancer Neg Hx   . Liver disease Neg Hx   . Pancreatic cancer Neg Hx   . Prostate cancer Neg Hx   . Rectal cancer Neg Hx   . Stomach cancer Neg Hx     Allergies  Allergen Reactions  . Metformin And Related Nausea And Vomiting  . Metronidazole Nausea And Vomiting  . Penicillins     childhood  . Xanax [Alprazolam]     "Caused upper respiratory symptoms" per pt  . Glyburide Other (See Comments)    "blood sugar dropped uncontrollably"  . Linagliptin Diarrhea and Other (See Comments)    Stomach pain, sinus infection  .  Moxifloxacin Diarrhea    Outpatient Medications Prior to Visit  Medication Sig Dispense Refill  . BYDUREON BCISE 2 MG/0.85ML AUIJ INJECT 2 MG BY SUBQ ROUTE ONCE A WEEK 8 mL 1  . hydrOXYzine (ATARAX/VISTARIL) 25 MG tablet Take 1 tablet (25 mg total) by mouth 3 (three) times daily as needed. 270 tablet 0  . ibuprofen (ADVIL,MOTRIN) 600 MG tablet Take 600 mg by mouth every 6 (six) hours as needed.    [name] Devices (ACCU-CHEK SOFTCLIX) lancets Use as instructed daily. 1 each 5  . lisinopril (ZESTRIL) 5 MG tablet TAKE 1 TABLET BY MOUTH EVERY DAY 90 tablet 1  . methocarbamol (ROBAXIN) 750 MG tablet Take 1 tablet (750 mg total) by mouth every 8 (eight) hours as needed for muscle spasms. 180 tablet 1  . Multiple Vitamin (MULTI-VITAMIN DAILY) TABS Take 1 tablet  by mouth daily as needed.    Marland Kitchen omeprazole (PRILOSEC) 40 MG capsule Take 1 tab every morning. 90 capsule 3  . acyclovir (ZOVIRAX) 400 MG tablet Take 1 tablet (400 mg total) by mouth 2 (two) times daily. 180 tablet 0  . albuterol (PROAIR HFA) 108 (90 Base) MCG/ACT inhaler Inhale 2 puffs into the lungs every 4 (four) hours as needed for wheezing or shortness of breath. 3 Inhaler 0  . amLODipine (NORVASC) 10 MG tablet Take 1 tablet (10 mg total) by mouth daily. 90 tablet 1  . atorvastatin (LIPITOR) 20 MG tablet Take 1 tablet (20 mg total) by mouth daily. 90 tablet 1  . Blood Glucose Monitoring Suppl (ACCU-CHEK AVIVA PLUS) w/Device KIT USE AS DIRECTED DAILY. E11.9 1 kit 0  . buPROPion (WELLBUTRIN SR) 150 MG 12 hr tablet TAKE 1 TABLET BY MOUTH TWICE A DAY (Patient not taking: Reported on 07/02/2020) 180 tablet 1  . chlorhexidine (HIBICLENS) 4 % external liquid Apply topically daily as needed. (Patient not taking: Reported on 07/02/2020) 120 mL 2  . Continuous Blood Gluc Receiver ([employer]) [place] 1 each by Does not apply route as directed. (Patient not taking: Reported on 11/08/2019) 1 each 0  . Continuous Blood Gluc Sensor (DEXCOM G6 SENSOR) MISC 1 each by Does not apply route as directed. (Patient not taking: Reported on 08/17/2019) 3 each 11  . Continuous Blood Gluc Transmit (DEXCOM G6 TRANSMITTER) MISC 1 each by Does not apply route as directed. (Patient not taking: Reported on 11/08/2019) 1 each 3  . doxycycline (VIBRA-TABS) 100 MG tablet Take 1 tablet (100 mg total) by mouth 2 (two) times daily. (Patient not taking: Reported on 07/02/2020) 20 tablet 0  . DULoxetine (CYMBALTA) 60 MG capsule TAKE 1 CAPSULE BY MOUTH EVERY DAY (Patient not taking: Reported on 07/02/2020) 90 capsule 0  . Exenatide ER (BYDUREON) 2 MG PEN 2 mg by Subconjunctival route once a week. 4 each 6  . nicotine (NICODERM CQ - DOSED IN MG/24 HOURS) 21 mg/24hr patch Place 21 mg onto the skin daily. (Patient not taking: Reported on  07/02/2020)    . predniSONE (DELTASONE) 20 MG tablet Take 1 tablet (20 mg total) by mouth daily with breakfast. (Patient not taking: Reported on 07/02/2020) 5 tablet 0  . Vitamin D, Ergocalciferol, (DRISDOL) 1.25 MG (50000 UNIT) CAPS capsule Take 1 capsule (50,000 Units total) by mouth every 7 (seven) days. (Patient not taking: Reported on 07/02/2020) 12 capsule 0   Facility-Administered Medications Prior to Visit  Medication Dose Route Frequency Provider Last Rate Last Admin  . 0.9 %  sodium chloride infusion  500 mL Intravenous  Once Pyrtle, Lajuan Lines, MD         ROS Review of Systems  Constitutional: Negative for activity change, appetite change and fatigue.  HENT: Negative for congestion, sinus pressure and sore throat.   Eyes: Negative for visual disturbance.  Respiratory: Negative for cough, chest tightness, shortness of breath and wheezing.   Cardiovascular: Negative for chest pain and palpitations.  Gastrointestinal: Negative for abdominal distention, abdominal pain and constipation.  Endocrine: Negative for polydipsia.  Genitourinary: Negative for dysuria and frequency.  Musculoskeletal:       See HPI  Skin: Negative for rash.  Neurological: Positive for numbness. Negative for tremors and light-headedness.  Hematological: Does not bruise/bleed easily.  Psychiatric/Behavioral: Positive for dysphoric mood. Negative for agitation and behavioral problems.    Objective:  BP 116/73   Pulse 87   Ht _0  (1.6 m)   Wt 192 lb (87.1 kg)   SpO2 98%   BMI 34.01 kg/m   BP/Weight 07/02/2020 11/08/2019 51/07/2110  Systolic BP 735 670 141  Diastolic BP 73 73 87  Wt. (Lbs) 192 193 185  BMI 34.01 33.65 32.26      Physical Exam Constitutional:      Appearance: She is well-developed.  Neck:     Vascular: No JVD.  Cardiovascular:     Rate and Rhythm: Normal rate.     Heart sounds: Normal heart sounds. No murmur heard.   Pulmonary:     Effort: Pulmonary effort is normal.     Breath  sounds: Normal breath sounds. No wheezing or rales.  Chest:     Chest wall: No tenderness.  Abdominal:     General: Bowel sounds are normal. There is no distension.     Palpations: Abdomen is soft. There is no mass.     Tenderness: There is no abdominal tenderness.  Musculoskeletal:        General: Normal range of motion.     Right lower leg: No edema.     Left lower leg: No edema.  Neurological:     Mental Status: She is alert and oriented to person, place, and time.     Comments: Reduced left hand grip; normal right hand grip Normal strength in bilateral lower extremities  Psychiatric:     Comments: Dysphoric mood     CMP Latest Ref Rng & Units 11/08/2019 06/01/2019 02/24/2018  Glucose 65 - 99 mg/dL 110(H) 105(H) 90  BUN 6 - 24 mg/dL _1 Creatinine 0.57 - 1.00 mg/dL 0.62 0.69 0.65  Sodium 134 - 144 mmol/L 140 138 141  Potassium 3.5 - 5.2 mmol/L 4.8 4.1 4.6  Chloride 96 - 106 mmol/L 103 101 104  CO2 20 - 29 mmol/L _2 Calcium 8.7 - 10.2 mg/dL 9.4 9.6 9.4  Total Protein 6.0 - 8.5 g/dL 6.6 7.0 6.7  Total Bilirubin 0.0 - 1.2 mg/dL <0.2 <0.2 <0.2  Alkaline Phos 39 - 117 IU/L 100 106 93  AST 0 - 40 IU/L _3 ALT 0 - 32 IU/L _4 Lipid Panel     Component Value Date/Time   CHOL 179 11/08/2019 1019   TRIG 76 11/08/2019 1019   HDL 58 11/08/2019 1019   CHOLHDL 3.1 11/08/2019 1019   CHOLHDL 4.4 10/28/2016 0851   LDLCALC 107 (H) 11/08/2019 1019    CBC    Component Value Date/Time   WBC 13.8 (H) 06/01/2019 1532   WBC 9.4 10/12/2016 0517  RBC 4.57 06/01/2019 1532   RBC 4.10 10/12/2016 0517   HGB 11.8 06/01/2019 1532   HCT 37.8 06/01/2019 1532   PLT 413 06/01/2019 1532   MCV 83 06/01/2019 1532   MCH 25.8 (L) 06/01/2019 1532   MCH 28.0 10/12/2016 0517   MCHC 31.2 (L) 06/01/2019 1532   MCHC 33.1 10/12/2016 0517   RDW 17.6 (H) 06/01/2019 1532   LYMPHSABS 2.8 06/01/2019 1532   MONOABS 0.6 10/12/2016 0517   EOSABS 0.1 06/01/2019 1532   BASOSABS 0.1  06/01/2019 1532    Lab Results  Component Value Date   HGBA1C 6.8 07/02/2020    Assessment & Plan:  1. Type 2 diabetes mellitus with other neurologic complication, without long-term current use of insulin (HCC) Controlled with A1c of 6.8 Continue current regimen Counseled on Diabetic diet, my plate method, 774 minutes of moderate intensity exercise/week Blood sugar logs with fasting goals of 80-120 mg/dl, random of less than 180 and in the event of sugars less than 60 mg/dl or greater than 400 mg/dl encouraged to notify the clinic. Advised on the need for annual eye exams, annual foot exams, Pneumonia vaccine. - POCT glucose (manual entry) - POCT glycosylated hemoglobin (Hb A1C) - Exenatide ER (BYDUREON BCISE) 2 MG/0.85ML AUIJ; INJECT 2 MG BY SUBQ ROUTE ONCE A WEEK  Dispense: 8 mL; Refill: 1 - CMP14+EGFR  2. Healthcare maintenance Requests referral to GYN for Pap smear-referral placed - Ambulatory referral to Gynecology  3. Encounter for screening mammogram for malignant neoplasm of breast - MM 3D SCREEN BREAST BILATERAL; Future  4. History of herpes zoster - acyclovir (ZOVIRAX) 400 MG tablet; Take 1 tablet (400 mg total) by mouth 2 (two) times daily.  Dispense: 180 tablet; Refill: 1  5. Hypertension, unspecified type Controlled - amLODipine (NORVASC) 10 MG tablet; Take 1 tablet (10 mg total) by mouth daily.  Dispense: 90 tablet; Refill: 1  6. Hyperlipidemia, unspecified hyperlipidemia type Controlled Low-cholesterol diet - atorvastatin (LIPITOR) 20 MG tablet; Take 1 tablet (20 mg total) by mouth daily.  Dispense: 90 tablet; Refill: 1  7. Anxiety and depression Uncontrolled Worsened by underlying symptoms Advised to restart Cymbalta Continue with her therapist - hydrOXYzine (ATARAX/VISTARIL) 25 MG tablet; Take 1 tablet (25 mg total) by mouth 3 (three) times daily as needed.  Dispense: 270 tablet; Refill: 1  8. Pain in both thighs This could be manifestations of her  fibromyalgia Unfortunately this is affecting her gait and physical therapy has been ineffective Continue with brace for right leg Referred to rheumatology in the past but referral was declined; will refer to [facility] - methocarbamol (ROBAXIN) 750 MG tablet; Take 1 tablet (750 mg total) by mouth every 8 (eight) hours as needed for muscle spasms.  Dispense: 180 tablet; Refill: 1 - Ambulatory referral to Rheumatology  9. Fibromyalgia Advised of mindfulness-based therapies, aquatic therapy, massage Also to be scheduled for support groups  Placed on Lyrica. - pregabalin (LYRICA) 75 MG capsule; Take 1 capsule (75 mg total) by mouth 2 (two) times daily.  Dispense: 60 capsule; Refill: 3 - DULoxetine (CYMBALTA) 60 MG capsule; TAKE 1 CAPSULE BY MOUTH EVERY DAY  Dispense: 90 capsule; Refill: 1  10. Paresthesias Referred to neurology in the setting of worsening paresthesias, weakness and gait abnormality - pregabalin (LYRICA) 75 MG capsule; Take 1 capsule (75 mg total) by mouth 2 (two) times daily.  Dispense: 60 capsule; Refill: 3 - Ambulatory referral to Neurology  11. Vitamin D deficiency - VITAMIN D 25 Hydroxy (Vit-D  Deficiency, Fractures)  12. Need for hepatitis C screening test - HCV RNA quant rflx ultra or genotyp([facility])   No orders of the defined types were placed in this encounter.   Follow-up:   Return in about 3 months (around 10/01/2020) for chronic disease management.      Charlott Rakes, MD, FAAFP. [facility], [place]   07/02/2020, 4:37 PM

## 2020-07-03 ENCOUNTER — Other Ambulatory Visit: Payer: Self-pay | Admitting: Family Medicine

## 2020-07-03 ENCOUNTER — Ambulatory Visit: Payer: Medicare HMO | Admitting: Physical Therapy

## 2020-07-03 ENCOUNTER — Telehealth: Payer: Self-pay

## 2020-07-03 DIAGNOSIS — N6489 Other specified disorders of breast: Secondary | ICD-10-CM

## 2020-07-03 NOTE — Telephone Encounter (Signed)
PREGABALIN 75MG  [place] APPROVED THRU 10/25/20

## 2020-07-04 ENCOUNTER — Other Ambulatory Visit: Payer: Self-pay | Admitting: Family Medicine

## 2020-07-04 LAB — HCV RNA QUANT RFLX ULTRA OR GENOTYP: HCV Quant Baseline: NOT DETECTED IU/mL

## 2020-07-04 LAB — CMP14+EGFR
ALT: 9 IU/L (ref 0–32)
AST: 9 IU/L (ref 0–40)
Albumin/Globulin Ratio: 1.4 (ref 1.2–2.2)
Albumin: 3.7 g/dL — ABNORMAL LOW (ref 3.8–4.8)
Alkaline Phosphatase: 105 IU/L (ref 48–121)
BUN/Creatinine Ratio: 20 (ref 9–23)
BUN: 14 mg/dL (ref 6–24)
Bilirubin Total: <0.2 mg/dL (ref 0.0–1.2)
CO2: 24 mmol/L (ref 20–29)
Calcium: 8.9 mg/dL (ref 8.7–10.2)
Chloride: 106 mmol/L (ref 96–106)
Creatinine, Ser: 0.7 mg/dL (ref 0.57–1.00)
GFR calc Af Amer: 122 mL/min/{1.73_m2} (ref >59)
GFR calc non Af Amer: 106 mL/min/{1.73_m2} (ref >59)
Globulin, Total: 2.7 g/dL (ref 1.5–4.5)
Glucose: 158 mg/dL — ABNORMAL HIGH (ref 65–99)
Potassium: 3.9 mmol/L (ref 3.5–5.2)
Sodium: 141 mmol/L (ref 134–144)
Total Protein: 6.4 g/dL (ref 6.0–8.5)

## 2020-07-04 LAB — VITAMIN D 25 HYDROXY (VIT D DEFICIENCY, FRACTURES): Vit D, 25-Hydroxy: 14.9 ng/mL — ABNORMAL LOW (ref 30.0–100.0)

## 2020-07-04 MED ORDER — ERGOCALCIFEROL 1.25 MG (50000 UT) PO CAPS: 50000.0000 [IU] | ORAL_CAPSULE | ORAL | 0 refills | Status: DC

## 2020-07-05 DIAGNOSIS — Z20828 Contact with and (suspected) exposure to other viral communicable diseases: Secondary | ICD-10-CM | POA: Diagnosis not present

## 2020-07-09 ENCOUNTER — Telehealth: Payer: Self-pay | Admitting: Family Medicine

## 2020-07-09 DIAGNOSIS — Z20828 Contact with and (suspected) exposure to other viral communicable diseases: Secondary | ICD-10-CM | POA: Diagnosis not present

## 2020-07-09 NOTE — Telephone Encounter (Signed)
Pt states co worker at workplace tested positive for covid. Pt is not sure when she was exposed as workplace will not give that information. States they plan on rapid testing employees tomorrow. Pt works in group home. Pt reports runny nose. Advised to quarantine and have regular covid testing done. Also advised [facility] as resourse for her and employer. Pt verbalizes understanding.

## 2020-07-09 NOTE — Telephone Encounter (Signed)
Pt is not vaccinated.

## 2020-07-10 ENCOUNTER — Ambulatory Visit: Payer: Medicare HMO | Admitting: Physical Therapy

## 2020-07-10 NOTE — Telephone Encounter (Signed)
Will route to PCP for review. 

## 2020-07-10 NOTE — Telephone Encounter (Signed)
If she tests positive we can refer her for the monoclonal ab infusion if she so desires

## 2020-07-17 ENCOUNTER — Ambulatory Visit: Payer: Medicare HMO

## 2020-07-17 ENCOUNTER — Ambulatory Visit
Admission: RE | Admit: 2020-07-17 | Discharge: 2020-07-17 | Disposition: A | Discharge status: Home / Self Care | Payer: Medicare HMO | Source: Ambulatory Visit | Attending: Family Medicine | Admitting: Family Medicine

## 2020-07-17 ENCOUNTER — Ambulatory Visit: Payer: Medicare HMO | Admitting: Physical Therapy

## 2020-07-17 ENCOUNTER — Other Ambulatory Visit: Payer: Self-pay

## 2020-07-17 DIAGNOSIS — R922 Inconclusive mammogram: Secondary | ICD-10-CM | POA: Diagnosis not present

## 2020-07-17 DIAGNOSIS — N6489 Other specified disorders of breast: Secondary | ICD-10-CM

## 2020-08-02 ENCOUNTER — Encounter: Payer: Self-pay | Admitting: Family Medicine

## 2020-09-03 ENCOUNTER — Other Ambulatory Visit: Payer: Self-pay | Admitting: Family Medicine

## 2020-09-03 ENCOUNTER — Encounter: Payer: Self-pay | Admitting: Family Medicine

## 2020-09-03 DIAGNOSIS — M791 Myalgia, unspecified site: Secondary | ICD-10-CM

## 2020-09-03 DIAGNOSIS — Z20822 Contact with and (suspected) exposure to covid-19: Secondary | ICD-10-CM | POA: Diagnosis not present

## 2020-09-03 MED ORDER — ERGOCALCIFEROL 1.25 MG (50000 UT) PO CAPS: 50000.0000 [IU] | ORAL_CAPSULE | ORAL | 0 refills | Status: DC

## 2020-09-03 MED ORDER — IBUPROFEN 600 MG PO TABS: 600.0000 mg | ORAL_TABLET | Freq: Three times a day (TID) | ORAL | 1 refills | Status: DC | PRN

## 2020-09-03 MED ORDER — PREDNISONE 20 MG PO TABS: 20.0000 mg | ORAL_TABLET | Freq: Every day | ORAL | 0 refills | Status: DC

## 2020-09-11 ENCOUNTER — Ambulatory Visit: Payer: Medicare HMO | Admitting: Family Medicine

## 2020-09-13 ENCOUNTER — Encounter: Payer: Self-pay | Admitting: Obstetrics and Gynecology

## 2020-09-13 ENCOUNTER — Other Ambulatory Visit: Payer: Self-pay

## 2020-09-13 ENCOUNTER — Ambulatory Visit (INDEPENDENT_AMBULATORY_CARE_PROVIDER_SITE_OTHER): Payer: Medicare HMO | Admitting: Obstetrics and Gynecology

## 2020-09-13 ENCOUNTER — Other Ambulatory Visit (HOSPITAL_COMMUNITY)
Admission: RE | Admit: 2020-09-13 | Discharge: 2020-09-13 | Disposition: A | Discharge status: Home / Self Care | Payer: Medicare HMO | Source: Ambulatory Visit | Attending: Obstetrics and Gynecology | Admitting: Obstetrics and Gynecology

## 2020-09-13 VITALS — BP 131/82 | HR 80 | Wt 190.0 lb

## 2020-09-13 DIAGNOSIS — Z01419 Encounter for gynecological examination (general) (routine) without abnormal findings: Secondary | ICD-10-CM | POA: Diagnosis not present

## 2020-09-13 DIAGNOSIS — F419 Anxiety disorder, unspecified: Secondary | ICD-10-CM | POA: Diagnosis not present

## 2020-09-13 DIAGNOSIS — N920 Excessive and frequent menstruation with regular cycle: Secondary | ICD-10-CM

## 2020-09-13 DIAGNOSIS — F32A Anxiety disorder, unspecified: Secondary | ICD-10-CM

## 2020-09-13 DIAGNOSIS — Z803 Family history of malignant neoplasm of breast: Secondary | ICD-10-CM | POA: Diagnosis not present

## 2020-09-13 DIAGNOSIS — Z1159 Encounter for screening for other viral diseases: Secondary | ICD-10-CM | POA: Diagnosis not present

## 2020-09-13 DIAGNOSIS — R69 Illness, unspecified: Secondary | ICD-10-CM | POA: Diagnosis not present

## 2020-09-13 NOTE — Patient Instructions (Addendum)
Endometrial Ablation:  novasure Tranexamic acid oral tablets What is this medicine? TRANEXAMIC ACID (TRAN ex AM ik AS id) slows down or stops blood clots from being broken down. This medicine is used to treat heavy monthly menstrual bleeding. This medicine may be used for other purposes; ask your health care provider or pharmacist if you have questions. COMMON BRAND NAME(S): Cyklokapron, Lysteda What should I tell my health care provider before I take this medicine? They need to know if you have any of these conditions:  bleeding in the brain  blood clotting problems  kidney disease  vision problems  an unusual allergic reaction to tranexamic acid, other medicines, foods, dyes, or preservatives  pregnant or trying to get pregnant  breast-feeding How should I use this medicine? Take this medicine by mouth with a glass of water. Follow the directions on the prescription label. Do not cut, crush, or chew this medicine. You can take it with or without food. If it upsets your stomach, take it with food. Take your medicine at regular intervals. Do not take it more often than directed. Do not stop taking except on your doctor's advice. Do not take this medicine until your period has started. Do not take it for more than 5 days in a row. Do not take this medicine when you do not have your period. Talk to your pediatrician regarding the use of this medicine in children. While this drug may be prescribed for female children as young as 89 years of age for selected conditions, precautions do apply. Overdosage: If you think you have taken too much of this medicine contact a poison control center or emergency room at once. NOTE: This medicine is only for you. Do not share this medicine with others. What if I miss a dose? If you miss a dose, take it when you remember, and then take your next dose at least 6 hours later. Do not take more than 2 tablets at a time to make up for missed doses. What may  interact with this medicine? Do not take this medicine with any of the following medications:  estrogens  birth control pills, patches, injections, rings or other devices that contain both an estrogen and a progestin This medicine may also interact with the following medications:  certain medicines used to help your blood clot  tretinoin (taken by mouth) This list may not describe all possible interactions. Give your health care provider a list of all the medicines, herbs, non-prescription drugs, or dietary supplements you use. Also tell them if you smoke, drink alcohol, or use illegal drugs. Some items may interact with your medicine. What should I watch for while using this medicine? Tell your doctor or healthcare professional if your symptoms do not start to get better or if they get worse. Tell your doctor or healthcare professional if you notice any eye problems while taking this medicine. Your doctor will refer you to an eye doctor who will examine your eyes. What side effects may I notice from receiving this medicine? Side effects that you should report to your doctor or health care professional as soon as possible:  allergic reactions like skin rash, itching or hives, swelling of the face, lips, or tongue  breathing difficulties  changes in vision  sudden or severe pain in the chest, legs, head, or groin  unusually weak or tired Side effects that usually do not require medical attention (report to your doctor or health care professional if they continue or are bothersome):  back pain  headache  muscle or joint aches  sinus and nasal problems  stomach pain  tiredness This list may not describe all possible side effects. Call your doctor for medical advice about side effects. You may report side effects to FDA at 1-800-FDA-1088. Where should I keep my medicine? Keep out of the reach of children. Store at room temperature between 15 and 30 degrees C (59 and 86 degrees  F). Throw away any unused medicine after the expiration date. NOTE: This sheet is a summary. It may not cover all possible information. If you have questions about this medicine, talk to your doctor, pharmacist, or health care provider.  2020 Elsevier/Gold Standard (2015-11-14 09:12:15)  Endometrial ablation is a procedure that destroys the thin inner layer of the lining of the uterus (endometrium). This procedure may be done:  To stop heavy periods.  To stop bleeding that is causing anemia.  To control irregular bleeding.  To treat bleeding caused by small tumors (fibroids) in the endometrium. This procedure is often an alternative to major surgery, such as removal of the uterus and cervix (hysterectomy). As a result of this procedure:  You may not be able to have children. However, if you are premenopausal (you have not gone through menopause): ? You may still have a small chance of getting pregnant. ? You will need to use a reliable method of birth control after the procedure to prevent pregnancy.  You may stop having a menstrual period, or you may have only a small amount of bleeding during your period. Menstruation may return several years after the procedure. Tell a health care provider about:  Any allergies you have.  All medicines you are taking, including vitamins, herbs, eye drops, creams, and over-the-counter medicines.  Any problems you or family members have had with the use of anesthetic medicines.  Any blood disorders you have.  Any surgeries you have had.  Any medical conditions you have. What are the risks? Generally, this is a safe procedure. However, problems may occur, including:  A hole (perforation) in the uterus or bowel.  Infection of the uterus, bladder, or vagina.  Bleeding.  Damage to other structures or organs.  An air bubble in the lung (air embolus).  Problems with pregnancy after the procedure.  Failure of the procedure.  Decreased  ability to diagnose cancer in the endometrium. What happens before the procedure?  You will have tests of your endometrium to make sure there are no pre-cancerous cells or cancer cells present.  You may have an ultrasound of the uterus.  You may be given medicines to thin the endometrium.  Ask your health care provider about: ? Changing or stopping your regular medicines. This is especially important if you take diabetes medicines or blood thinners. ? Taking medicines such as aspirin and ibuprofen. These medicines can thin your blood. Do not take these medicines before your procedure if your doctor tells you not to.  Plan to have someone take you home from the hospital or clinic. What happens during the procedure?   You will lie on an exam table with your feet and legs supported as in a pelvic exam.  To lower your risk of infection: ? Your health care team will wash or sanitize their hands and put on germ-free (sterile) gloves. ? Your genital area will be washed with soap.  An IV tube will be inserted into one of your veins.  You will be given a medicine to help you relax (sedative).  A surgical instrument with a light and camera (resectoscope) will be inserted into your vagina and moved into your uterus. This allows your surgeon to see inside your uterus.  Endometrial tissue will be removed using one of the following methods: ? Radiofrequency. This method uses a radiofrequency-alternating electric current to remove the endometrium. ? Cryotherapy. This method uses extreme cold to freeze the endometrium. ? Heated-free liquid. This method uses a heated saltwater (saline) solution to remove the endometrium. ? Microwave. This method uses high-energy microwaves to heat up the endometrium and remove it. ? Thermal balloon. This method involves inserting a catheter with a balloon tip into the uterus. The balloon tip is filled with heated fluid to remove the endometrium. The procedure may  vary among health care providers and hospitals. What happens after the procedure?  Your blood pressure, heart rate, breathing rate, and blood oxygen level will be monitored until the medicines you were given have worn off.  As tissue healing occurs, you may notice vaginal bleeding for 4-6 weeks after the procedure. You may also experience: ? Cramps. ? Thin, watery vaginal discharge that is light pink or brown in color. ? A need to urinate more frequently than usual. ? Nausea.  Do not drive for 24 hours if you were given a sedative.  Do not have sex or insert anything into your vagina until your health care provider approves. Summary  Endometrial ablation is done to treat the many causes of heavy menstrual bleeding.  The procedure may be done only after medications have been tried to control the bleeding.  Plan to have someone take you home from the hospital or clinic. This information is not intended to replace advice given to you by your health care provider. Make sure you discuss any questions you have with your health care provider. Document Revised: 03/29/2018 Document Reviewed: 10/29/2016 Elsevier Patient Education  [place]. Levonorgestrel intrauterine device (IUD) What is this medicine? LEVONORGESTREL IUD (LEE voe nor jes trel) is a contraceptive (birth control) device. The device is placed inside the uterus by a healthcare professional. It is used to prevent pregnancy. This device can also be used to treat heavy bleeding that occurs during your period. This medicine may be used for other purposes; ask your health care provider or pharmacist if you have questions. COMMON BRAND NAME(S): Minette [place] What should I tell my health care provider before I take this medicine? They need to know if you have any of these conditions:  abnormal Pap smear  cancer of the breast, uterus, or cervix  diabetes  endometritis  genital or pelvic infection now or  in the past  have more than one sexual partner or your partner has more than one partner  heart disease  history of an ectopic or tubal pregnancy  immune system problems  IUD in place  liver disease or tumor  problems with blood clots or take blood-thinners  seizures  use intravenous drugs  uterus of unusual shape  vaginal bleeding that has not been explained  an unusual or allergic reaction to levonorgestrel, other hormones, silicone, or polyethylene, medicines, foods, dyes, or preservatives  pregnant or trying to get pregnant  breast-feeding How should I use this medicine? This device is placed inside the uterus by a health care professional. Talk to your pediatrician regarding the use of this medicine in children. Special care may be needed. Overdosage: If you think you have taken too much of this medicine contact a poison control center  or emergency room at once. NOTE: This medicine is only for you. Do not share this medicine with others. What if I miss a dose? This does not apply. Depending on the brand of device you have inserted, the device will need to be replaced every 3 to 6 years if you wish to continue using this type of birth control. What may interact with this medicine? Do not take this medicine with any of the following medications:  amprenavir  bosentan  fosamprenavir This medicine may also interact with the following medications:  aprepitant  armodafinil  barbiturate medicines for inducing sleep or treating seizures  bexarotene  boceprevir  griseofulvin  medicines to treat seizures like carbamazepine, ethotoin, felbamate, oxcarbazepine, phenytoin, topiramate  modafinil  pioglitazone  rifabutin  rifampin  rifapentine  some medicines to treat HIV infection like atazanavir, efavirenz, indinavir, lopinavir, nelfinavir, tipranavir, ritonavir  St. John's wort  warfarin This list may not describe all possible interactions. Give  your health care provider a list of all the medicines, herbs, non-prescription drugs, or dietary supplements you use. Also tell them if you smoke, drink alcohol, or use illegal drugs. Some items may interact with your medicine. What should I watch for while using this medicine? Visit your doctor or health care professional for regular check ups. See your doctor if you or your partner has sexual contact with others, becomes HIV positive, or gets a sexual transmitted disease. This product does not protect you against HIV infection (AIDS) or other sexually transmitted diseases. You can check the placement of the IUD yourself by reaching up to the top of your vagina with clean fingers to feel the threads. Do not pull on the threads. It is a good habit to check placement after each menstrual period. Call your doctor right away if you feel more of the IUD than just the threads or if you cannot feel the threads at all. The IUD may come out by itself. You may become pregnant if the device comes out. If you notice that the IUD has come out use a backup birth control method like condoms and call your health care provider. Using tampons will not change the position of the IUD and are okay to use during your period. This IUD can be safely scanned with magnetic resonance imaging (MRI) only under specific conditions. Before you have an MRI, tell your healthcare provider that you have an IUD in place, and which type of IUD you have in place. What side effects may I notice from receiving this medicine? Side effects that you should report to your doctor or health care professional as soon as possible:  allergic reactions like skin rash, itching or hives, swelling of the face, lips, or tongue  fever, flu-like symptoms  genital sores  high blood pressure  no menstrual period for 6 weeks during use  pain, swelling, warmth in the leg  pelvic pain or tenderness  severe or sudden headache  signs of  pregnancy  stomach cramping  sudden shortness of breath  trouble with balance, talking, or walking  unusual vaginal bleeding, discharge  yellowing of the eyes or skin Side effects that usually do not require medical attention (report to your doctor or health care professional if they continue or are bothersome):  acne  breast pain  change in sex drive or performance  changes in weight  cramping, dizziness, or faintness while the device is being inserted  headache  irregular menstrual bleeding within first 3 to 6 months of use  nausea This list may not describe all possible side effects. Call your doctor for medical advice about side effects. You may report side effects to FDA at 1-800-FDA-1088. Where should I keep my medicine? This does not apply. NOTE: This sheet is a summary. It may not cover all possible information. If you have questions about this medicine, talk to your doctor, pharmacist, or health care provider.  2020 Elsevier/Gold Standard (2018-08-23 13:22:01) Menorrhagia Menorrhagia is when your menstrual periods are heavy or last longer than normal. Follow these instructions at home: Medicines   Take over-the-counter and prescription medicines exactly as told by your doctor. This includes iron pills.  Do not change or switch medicines without asking your doctor.  Do not take aspirin or medicines that contain aspirin 1 week before or during your period. Aspirin may make bleeding worse. General instructions  If you need to change your pad or tampon more than once every 2 hours, limit your activity until the bleeding stops.  Iron pills can cause problems when pooping (constipation). To prevent or treat pooping problems while taking prescription iron pills, your doctor may suggest that you: ? Drink enough fluid to keep your pee (urine) clear or pale yellow. ? Take over-the-counter or prescription medicines. ? Eat foods that are high in fiber. These foods  include:  Fresh fruits and vegetables.  Whole grains.  Beans. ? Limit foods that are high in fat and processed sugars. This includes fried and sweet foods.  Eat healthy meals and foods that are high in iron. Foods that have a lot of iron include: ? Leafy green vegetables. ? Meat. ? Liver. ? Eggs. ? Whole grain breads and cereals.  Do not try to lose weight until your heavy bleeding has stopped and you have normal amounts of iron in your blood. If you need to lose weight, work with your doctor.  Keep all follow-up visits as told by your doctor. This is important. Contact a doctor if:  You soak through a pad or tampon every 1 or 2 hours, and this happens every time you have a period.  You need to use pads and tampons at the same time because you are bleeding so much.  You are taking medicine and you: ? Feel sick to your stomach (nauseous). ? Throw up (vomit). ? Have watery poop (diarrhea).  You have other problems that may be related to the medicine you are taking. Get help right away if:  You soak through more than a pad or tampon in 1 hour.  You pass clots bigger than 1 inch (2.5 cm) wide.  You feel short of breath.  You feel like your heart is beating too fast.  You feel dizzy or you pass out (faint).  You feel very weak or tired. Summary  Menorrhagia is when your menstrual periods are heavy or last longer than normal.  Take over-the-counter and prescription medicines exactly as told by your doctor. This includes iron pills.  Contact a doctor if you soak through more than a pad or tampon in 1 hour or are passing large clots. This information is not intended to replace advice given to you by your health care provider. Make sure you discuss any questions you have with your health care provider. Document Revised: 01/19/2018 Document Reviewed: 11/02/2016 Elsevier Patient Education  [place].

## 2020-09-14 NOTE — Progress Notes (Signed)
Subjective:  CC: pap smear and heavy menses  Patient ID: Amber Rose, female    DOB: [birth year], 44 y.o.   MRN: [id number]  HPI  44 yo G4P3, SVD x 2, c/s x 1 seen at [facility].  She is seen for pap smear.  Per pt her last pap smear was 2-3 years ago.  She was not sure of the duration.  The patient is also concerned about heavy regular menses.  Menses last 7 days and are heavy 5-6 days.  Menarche was at age 20.  The patient denies any syncope or dizziness.      Review of Systems  Constitutional: Negative.   HENT: Negative.   Respiratory: Negative.   Cardiovascular: Negative.   Gastrointestinal: Negative.   Genitourinary: Positive for menstrual problem.  Musculoskeletal: Negative.   Neurological: Negative.   Psychiatric/Behavioral: Negative.    Past Surgical History:  Procedure Laterality Date  . APPENDECTOMY  1990  . ceasarian  2002  . CESAREAN SECTION     x1  . CHOLECYSTECTOMY  2011  . COLONOSCOPY    . UPPER GASTROINTESTINAL ENDOSCOPY    . URINARY SURGERY     urethra sling, removal, and then revision 2016 (4 surgeries)  . WISDOM TOOTH EXTRACTION      Past Medical History:  Diagnosis Date  . Allergy   . Anxiety   . Arthritis   . Chronic UTI   . Depression   . Diabetes mellitus without complication ([place])   . Diverticulosis   . Fibromyalgia   . GERD (gastroesophageal reflux disease)   . Hiatal hernia   . Hyperlipidemia   . Hypertension   . Internal hemorrhoids   . Meningitis, viral   . Pancreatitis    Social History   Socioeconomic History  . Marital status: Single    Spouse name: Not on file  . Number of children: 3  . Years of education: Not on file  . Highest education level: Not on file  Occupational History  . Not on file  Tobacco Use  . Smoking status: Current Every Day Smoker    Packs/day: 1.00    Years: 20.00    Pack years: 20.00    Types: Cigarettes  . Smokeless tobacco: Never Used  Vaping Use  . Vaping Use: Never used  Substance and  Sexual Activity  . Alcohol use: Yes    Comment: rarely  . Drug use: Yes    Types: Marijuana    Comment: last smoker 11-14-17  . Sexual activity: Never    Comment: pt on her mentral cycle now began 11-14-17  Other Topics Concern  . Not on file  Social History Narrative   Lives home with adult niece.  Not working.  Disability pending.  Pt is single.  Education GED.  3 children.    Social Determinants of Health   Financial Resource Strain:   . Difficulty of Paying Living Expenses: Not on file  Food Insecurity: Food Insecurity Present  . Worried About Charity fundraiser in the Last Year: Sometimes true  . Ran Out of Food in the Last Year: Sometimes true  Transportation Needs: No Transportation Needs  . Lack of Transportation (Medical): No  . Lack of Transportation (Non-Medical): No  Physical Activity:   . Days of Exercise per Week: Not on file  . Minutes of Exercise per Session: Not on file  Stress:   . Feeling of Stress : Not on file  Social Connections:   . Frequency of Communication  with Friends and Family: Not on file  . Frequency of Social Gatherings with Friends and Family: Not on file  . Attends Religious Services: Not on file  . Active Member of Clubs or Organizations: Not on file  . Attends Archivist Meetings: Not on file  . Marital Status: Not on file  Intimate Partner Violence:   . Fear of Current or Ex-Partner: Not on file  . Emotionally Abused: Not on file  . Physically Abused: Not on file  . Sexually Abused: Not on file       Objective:   Physical Exam Vitals reviewed.  Constitutional:      Appearance: Normal appearance.  HENT:     Head: Normocephalic and atraumatic.  Cardiovascular:     Rate and Rhythm: Normal rate and regular rhythm.     Heart sounds: Normal heart sounds.  Pulmonary:     Effort: Pulmonary effort is normal.     Breath sounds: Normal breath sounds.  Abdominal:     General: Abdomen is flat. There is no distension.      Palpations: Abdomen is soft.     Tenderness: There is no abdominal tenderness.  Genitourinary:    Comments: SSE: cervix WNL, pap taken without incident, vagina WNL, uterus normal size Musculoskeletal:        General: Normal range of motion.  Neurological:     Mental Status: She is alert.    Vitals:   09/13/20 0939  BP: 131/82  Pulse: 80         Assessment & Plan:   1. Well woman exam with routine gynecological exam - Cytology - PAP( [place]) - Ambulatory referral to [facility]  2. Anxiety and depression - Ambulatory referral to [place]  3. Menorrhagia with regular cycle The patient was offered endometrial biopsy today, but she declined, she would like to premedicate before the procedure, biopsy in 3 weeks.  Pelvic u/s ordered in 3 weeks  Discussed treatment options including lysteda, progesterone iud, uterine ablation or hysterectomy. - US PELVIC COMPLETE WITH TRANSVAGINAL; Future  4. Encounter for cervical Pap smear with pelvic exam  5. Family history of malignant neoplasm of breast  - Genetic Screening  F/u in 3 weeks for endometrial biopsy  Griffin Basil, MD Faculty Attending, [facility]

## 2020-09-17 LAB — CYTOLOGY - PAP
Comment: NEGATIVE
Diagnosis: NEGATIVE
Diagnosis: REACTIVE
High risk HPV: NEGATIVE

## 2020-09-24 ENCOUNTER — Other Ambulatory Visit: Payer: Self-pay | Admitting: Family Medicine

## 2020-09-24 DIAGNOSIS — E1149 Type 2 diabetes mellitus with other diabetic neurological complication: Secondary | ICD-10-CM

## 2020-09-30 ENCOUNTER — Encounter: Payer: Self-pay | Admitting: *Deleted

## 2020-09-30 NOTE — BH Specialist Note (Signed)
Integrated Behavioral Health via Telemedicine Visit  09/30/2020 Amber Rose [id number]  Number of Raymond visits: 1 Session Start time: 9:18  Session End time: 10:18 Total time: 60  Referring Provider: Lynnda Shields, MD Patient/Family location: Home [facility] Provider location: Center for [facility] at [facility]  All persons participating in visit: Patient Amber Rose and Amber Rose   Types of Service: Individual psychotherapy  I connected with Amber Rose and/or Amber Rose's n/a by Telephone and verified that I am speaking with the correct person using two identifiers.    Discussed confidentiality: Yes   I discussed the limitations of telemedicine and the availability of in person appointments.  Discussed there is a possibility of technology failure and discussed alternative modes of communication if that failure occurs.  I discussed that engaging in this telemedicine visit, they consent to the provision of behavioral healthcare and the services will be billed under their insurance.  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  Duration of problem: Ongoing; Severity of problem: moderately severe  Patient and/or Family's Strengths/Protective Factors: Social connections and Sense of  purpose  Goals Addressed: Patient will: 1.  Reduce symptoms of: anxiety and depression  2.  Increase knowledge and/or ability of: stress reduction  3.  Demonstrate ability to: Increase healthy adjustment to current life circumstances, Increase adequate support systems for patient/family and Continue healthy grieving over loss  Progress towards Goals: Ongoing  Interventions: Interventions utilized:  Solution-Focused Strategies, Psychoeducation and/or Health Education and Link to [employer] Assessments completed: none today  Patient and/or Family Response: Pt agrees to treatment plan  Assessment: Patient currently experiencing Anxiety disorder, unspecified and Psychosocial stress.   Patient may benefit from psychoeducation and brief therapeutic interventions regarding coping with symptoms of anxiety w panic and depression   Plan: 1. Follow up with behavioral health clinician on : Three weeks 2. Behavioral recommendations:  -Continue taking BH medication as prescribed by PCP; discuss with PCP any side effects that continue -Read educational materials regarding coping with symptoms of anxiety with panic attacks -Consider grief and caregiver support, and additional community resources (on AVS) as needed  -Accept referral to [employer] 3. Referral(s): [place] (In Clinic) and [employer] Resources:  Food and grief and caregiver support  I discussed the assessment and treatment plan with the patient and/or parent/guardian. They were provided an opportunity to ask questions and all were answered. They agreed with the plan and demonstrated an understanding of the instructions.   They were advised to call back or seek an in-person evaluation if the symptoms worsen or if the condition fails to improve as anticipated.  Garlan Fair, LCSW   Depression screen [facility] 2/9 09/13/2020 07/02/2020 11/08/2019 07/24/2019 06/01/2019  Decreased  Interest 1 1 2 2 2   Down, Depressed, Hopeless 2 2 2  - 2  PHQ - 2 Score 3 3 4 2 4   Altered  sleeping 2 2 2 2 2   Tired, decreased energy 2 3 2 2 2   Change in appetite 0 1 1 1 2   Feeling bad or failure about yourself  1 2 2 2 2   Trouble concentrating 2 3 3 2 2   Moving slowly or fidgety/restless 0 1 0 0 0  Suicidal thoughts 1 2 1 2 2   PHQ-9 Score 11 17 15 13 16   Some recent data might be hidden   GAD 7 : Generalized Anxiety Score 09/13/2020 07/02/2020 11/08/2019 07/24/2019  Nervous, Anxious, on Edge 3 3 3 3   Control/stop worrying 3 3 3 3   Worry too much - different things 3 3 3 3   Trouble relaxing 3 3 3 3   Restless 1 1 1 2   Easily annoyed or irritable 3 3 3 3   Afraid - awful might happen 3 3 3 3   Total GAD 7 Score 19 19 19 20   Anxiety Difficulty - - - -

## 2020-10-02 ENCOUNTER — Telehealth: Payer: Self-pay | Admitting: Lactation Services

## 2020-10-02 NOTE — Telephone Encounter (Signed)
Called patient to inform her that her [facility] results were Negative. Patient voiced understanding. She has no further questions or concerns at this time.

## 2020-10-03 ENCOUNTER — Ambulatory Visit: Payer: Medicare HMO | Admitting: Obstetrics and Gynecology

## 2020-10-09 ENCOUNTER — Other Ambulatory Visit: Payer: Self-pay | Admitting: Family Medicine

## 2020-10-09 ENCOUNTER — Telehealth: Payer: Self-pay | Admitting: Pharmacist

## 2020-10-09 ENCOUNTER — Encounter: Payer: Self-pay | Admitting: Family Medicine

## 2020-10-09 DIAGNOSIS — E1149 Type 2 diabetes mellitus with other diabetic neurological complication: Secondary | ICD-10-CM

## 2020-10-09 MED ORDER — PREGABALIN 75 MG PO CAPS: 75.0000 mg | ORAL_CAPSULE | Freq: Two times a day (BID) | ORAL | 3 refills | Status: DC

## 2020-10-09 NOTE — Telephone Encounter (Signed)
Amber Rose,   Patient is requesting PA approval for Lyrica. Are we able to start this?

## 2020-10-09 NOTE — Telephone Encounter (Signed)
   Notes to clinic pt was given rx two weeks ago, pharmacy asking for new rx for 90 day supply, please assess.

## 2020-10-10 ENCOUNTER — Other Ambulatory Visit: Payer: Self-pay

## 2020-10-10 ENCOUNTER — Ambulatory Visit (INDEPENDENT_AMBULATORY_CARE_PROVIDER_SITE_OTHER): Payer: Medicare HMO | Admitting: Clinical

## 2020-10-10 DIAGNOSIS — R69 Illness, unspecified: Secondary | ICD-10-CM | POA: Diagnosis not present

## 2020-10-10 DIAGNOSIS — Z658 Other specified problems related to psychosocial circumstances: Secondary | ICD-10-CM

## 2020-10-10 DIAGNOSIS — F419 Anxiety disorder, unspecified: Secondary | ICD-10-CM | POA: Diagnosis not present

## 2020-10-10 NOTE — Patient Instructions (Addendum)
Center for [facility] at [facility] [place], [place] [address] [phone] (main office) [phone] [facility] office)  Caregiver Support https://www.well-springsolutions.org/new-online-caregiver-programming-covid-19/  Grief support [facility] Author[facility].org [phone]  Coping with Panic Attacks What is a panic attack?  You may have had a panic attack if you experienced four or more of the symptoms listed below coming on abruptly and peaking in about 10 minutes.  Panic Symptoms   . Pounding heart  . Sweating  . Trembling or shaking  . Shortness of breath  . Feeling of choking  . Chest pain  . Nausea or abdominal distress    . Feeling dizzy, unsteady, lightheaded, or faint  . Feelings of unreality or being detached from yourself  . Fear of losing control or going crazy  . Fear of dying  . Numbness or tingling  . Chills or hot flashes      Panic attacks are sometimes accompanied by avoidance of certain places or situations. These are often situations that would be difficult to escape from or in which help might not be available. Examples might include crowded shopping malls, public transportation, restaurants, or driving.   Why do panic attacks occur?   Panic attacks are the body's alarm system gone awry. All of [place] have a built-in alarm system, powered by adrenaline, which increases our heart rate, breathing, and blood flow in response to danger. Ordinarily, this 'danger response system' works well. In some people, however, the response is either out of proportion to whatever stress is going on, or may come out of the blue without any stress at all.   For example, if you are walking in the woods and see a bear coming your way, a variety of changes occur in your body to prepare you to either fight the danger or flee from the situation. Your heart rate will increase to get more blood flow around your body, your breathing rate will  quicken so that more oxygen is available, and your muscles will tighten in order to be ready to fight or run. You may feel nauseated as blood flow leaves your stomach area and moves into your limbs. These bodily changes are all essential to helping you survive the dangerous situation. After the danger has passed, your body functions will begin to go back to normal. This is because your body also has a system for "recovering" by bringing your body back down to a normal state when the danger is over.   As you can see, the emergency response system is adaptive when there is, in fact, a "true" or "real" danger (e.g., bear). However, sometimes people find that their emergency response system is triggered in "everyday" situations where there really is no true physical danger (e.g., in a meeting, in the grocery store, while driving in normal traffic, etc.).   What triggers a panic attack?  Sometimes particularly stressful situations can trigger a panic attack. For example, an argument with your spouse or stressors at work can cause a stress response (activating the emergency response system) because you perceive it as threatening or overwhelming, even if there is no direct risk to your survival.  Sometimes panic attacks don't seem to be triggered by anything in particular- they may "come out of the blue". Somehow, the natural "fight or flight" emergency response system has gotten activated when there is no real danger. Why does the body go into "emergency mode" when there is no real danger?   Often, people with panic attacks are  frightened or alarmed by the physical sensations of the emergency response system. First, unexpected physical sensations are experienced (tightness in your chest or some shortness of breath). This then leads to feeling fearful or alarmed by these symptoms ("Something's wrong!", "Am I having a heart attack?", "Am I going to faint?") The mind perceives that there is a danger even though no  real danger exists. This, in turn, activates the emergency response system ("fight or flight"), leading to a "full blown" panic attack. In summary, panic attacks occur when we misinterpret physical symptoms as signs of impending death, craziness, loss of control, embarrassment, or fear of fear. Sometimes you may be aware of thoughts of danger that activate the emergency response system (for example, thinking "I'm having a heart attack" when you feel chest pressure or increased heart rate). At other times, however, you may not be aware of such thoughts. After several incidences of being afraid of physical sensations, anxiety and panic can occur in response to the initial sensations without conscious thoughts of danger. Instead, you just feel afraid or alarmed. In other words, the panic or fear may seem to occur "automatically" without you consciously telling yourself anything.   After having had one or more panic attacks, you may also become more focused on what is going on inside your body. You may scan your body and be more vigilant about noticing any symptoms that might signal the start of a panic attack. This makes it easier for panic attacks to happen again because you pick up on sensations you might otherwise not have noticed, and misinterpret them as something dangerous. A panic attack may then result.      How do I cope with panic attacks?  An important part of overcoming panic attacks involves re-interpreting your body's physical reactions and teaching yourself ways to decrease the physical arousal. This can be done through practicing the cognitive and behavioral interventions below.   Research has found that over half of people who have panic attacks show some signs of hyperventilation or overbreathing. This can produce initial sensations that alarm you and lead to a panic attack. Overbreathing can also develop as part of the panic attack and make the symptoms worse. When people hyperventilate,  certain blood vessels in the body become narrower. In particular, the brain may get slightly less oxygen. This can lead to the symptoms of dizziness, confusion, and lightheadedness that often occur during panic attacks. Other parts of the body may also get a bit less oxygen, which may lead to numbness or tingling in the hands or feet or the sensation of cold, clammy hands. It also may lead the heart to pump harder. Although these symptoms may be frightening and feel unpleasant, it is important to remember that hyperventilating is not dangerous. However, you can help overcome the unpleasantness of overbreathing by practicing Breathing Retraining.   Practice this basic technique three times a day, every day:  . Inhale. With your shoulders relaxed, inhale as slowly and deeply as you can while you count to six. If you can, use your diaphragm to fill your lungs with air.  . Hold. Keep the air in your lungs as you slowly count to four.  . Exhale. Slowly breath out as you count to six.  . Repeat. Do the inhale-hold-exhale cycle several times. Each time you do it, exhale for longer counts.  Like any new skill, Breathing Retraining requires practice. Try practicing this skill twice a day for several minutes. Initially, do not try  this technique in specific situations or when you become frightened or have a panic attack. Begin by practicing in a quiet environment to build up your skill level so that you can later use it in time of "emergency."   2. Decreasing Avoidance  Regardless of whether you can identify why you began having panic attacks or whether they seemed to come out of the blue, the places where you began having panic attacks often can become triggers themselves. It is not uncommon for individuals to begin to avoid the places where they have had panic attacks. Over time, the individual may begin to avoid more and more places, thereby decreasing their activities and often negatively impacting their  quality of life. To break the cycle of avoidance, it is important to first identify the places or situations that are being avoided, and then to do some "relearning."  To begin this intervention, first create a list of locations or situations that you tend to avoid. Then choose an avoided location or situation that you would like to target first. Now develop an "exposure hierarchy" for this situation or location. An "exposure hierarchy" is a list of actions that make you feel anxious in this situation. Order these actions from least to most anxiety-producing. It is often helpful to have the first item on your hierarchy involve thinking or imagining part of the feared/avoided situation.   Here is an example of an exposure hierarchy for decreasing avoidance of the grocery store. Note how it is ordered from the least amount of anxiety (at the top) to the most anxiety (at the bottom):  Marland Kitchen Think about going to the grocery store alone.  . Go to the grocery store with a friend or family member.  . Go to the grocery store alone to pick up a few small items (5-10 minutes in the store).  . Shopping for 10-20 minutes in the store alone.  . Doing the shopping for the week by myself (20-30 minutes in the store).   Your homework is to "expose" yourself to the lowest item on your hierarchy and use your breathing relaxation and coping statements (see below) to help you remain in the situation. Practice this several times during the upcoming week. Once you have mastered each item with minimal anxiety, move on to the next higher action on your list.   Cognitive Interventions  1. Identify your negative self-talk Anxious thoughts can increase anxiety symptoms and panic. The first step in changing anxious thinking is to identify your own negative, alarming self-talk. Some common alarming thoughts:  . I'm having a heart attack.            . I must be going crazy. . I think I'm dying. Marland Kitchen People will think I'm  crazy. . I'm going to pass our.  . Oh no- here it comes.  . I can't stand this.  Rene Paci got to get out of here!  2. Use positive coping statements Changing or disrupting a pattern of anxious thoughts by replacing them with more calming or supportive statements can help to divert a panic attack. Some common helpful coping statements:  . This is not an emergency.  . I don't like feeling this way, but I can accept it.  . I can feel like this and still be okay.  . This has happened before, and I was okay. I'll be okay this time, too.  . I can be anxious and still deal with this situation.   /Emotional Wellbeing  Apps and Websites Here are a few free apps meant to help you to help yourself.  To find, try searching on the internet to see if the app is offered on Apple/Android devices. If your first choice doesn't come up on your device, the good news is that there are many choices! Play around with different apps to see which ones are helpful to you.    Calm This is an app meant to help increase calm feelings. Includes info, strategies, and tools for tracking your feelings.      Calm Harm  This app is meant to help with self-harm. Provides many 5-minute or 15-min coping strategies for doing instead of hurting yourself.       Iota is a problem-solving tool to help deal with emotions and cope with stress you encounter wherever you are.      MindShift This app can help people cope with anxiety. Rather than trying to avoid anxiety, you can make an important shift and face it.      MY3  MY3 features a support system, safety plan and resources with the goal of offering a tool to use in a time of need.       My Life My Voice  This mood journal offers a simple solution for tracking your thoughts, feelings and moods. Animated emoticons can help identify your mood.       Relax Melodies Designed to help with sleep, on this app you can mix sounds and meditations  for relaxation.      Smiling Mind Smiling Mind is meditation made easy: it's a simple tool that helps put a smile on your mind.        Stop, Breathe & Think  A friendly, simple guide for people through meditations for mindfulness and compassion.  Stop, Breathe and Think Kids Enter your current feelings and choose a "mission" to help you cope. Offers videos for certain moods instead of just sound recordings.       Team Orange The goal of this tool is to help teens change how they think, act, and react. This app helps you focus on your own good feelings and experiences.      The Ashland Box The Ashland Box (VHB) contains simple tools to help patients with coping, relaxation, distraction, and positive thinking.     [place] [address], [place], [place] [address] [phone]   or  www.http://james-garner.info/ **SNAP/EBT/ Other nutritional benefits  [place] [address], [place], [place] [address] [phone]  or  https://palmer-smith.com/ **WIC for  women who are pregnant and postpartum, infants and children up to 60 years old  [place] [address], [place], [place] [address] [phone]   or   www.theblessedtable.org  **Food pantry  [facility] [place], [place], [place] [address] [phone]   or   https://brotherkolbes.godaddysites.com  **Emergency food and prepared meals  [place] [address] [place], [place] [address] [phone]   or   www.cedargrovetop.us **Food pantry  [place] [facility] [address], [place], [place] [address] [phone]   or   www.https://hartman-jones.net/ **Food  pantry  [employer] [facility] [employer] [facility] [address] [place], [place] [address] [phone] **Food pantry  [facility] [address] [place], [place] [address] [phone]   or   www.greensborourbanministry.org  **Food pantry  and prepared meals  [facility] [place], [place], [place], [place] [address] DomainerFinder.be  **Food pantry  [employer] [address], [place], [place] [address] [phone]   or   www.lbcnow.org  **Food pantry  [facility] [address] [place], [place] [address] [phone]   or   http://patterson-parker.net/ **Food pantry, nutrition education, gardening activities  [place] [address] [place], [place] [address] [phone] **Food pantry  [facility] [place] [address], [place], [place] [address] [phone]   or   www.salvationarmyofgreensboro.[place] Cliche of [place], [place], [place] [address] [phone]   or   http://senior-resources-guilford.org [facility] [place] [address], [place], [place] [address] [phone]   or   www.stmattchurch.com  **Food pantry  [place] [address] [place], [place] [address] [phone]   or   vandaliapresbyterianchurch.org **[place] (serves [place], [place], [place], [place], [place], [place], [place], [place], [place], [place], [place], [place], and [place] counties) [address] [place], [place] [address] [phone] http://dawson-may.[email or web address]  **Rental assistance, Home Rehabilitation,Weatherization Assistance Program, Forensic psychologist, Housing Voucher Program   [facility] [place] [address], [place], [place] [address] [phone] www.gha-Segundo.[place] [facility] [address] [place], [place] [address] [phone] https://manning.com/ **Programs include: Engineer, building services and Housing Counseling, Healthy Doctor, general practice, Homeless Prevention and [place] [address], [place], [place], [place] [address] [phone] www.https://www.farmer-stevens.info/ **housing applications/recertification; tax payment relief/exemption under specific qualifications  [employer] [address] [place], [place] [address] www.onlinegreensboro.com/~maryshouse **transitional housing for women in recovery who have minor children or are pregnant  [place], [place], [place] [address] ArtistMovie.se  **emergency shelter and support services for families facing homelessness  [place] [address], [place], [place] [address] [phone] www.youthfocus.org **transitional housing to pregnant women; emergency housing for youth who have run away, are experiencing a family crisis, are victims of abuse or neglect, or are homeless  [facility] [address], [place], [place] [address] [phone] ircgso.org **Drop-in center for people experiencing homelessness; overnight warming center when temperature is 25 degrees or below  [employer] [place], [place], [place] [address] [phone] https://reentrystaffingagency.org/ **help with affordable housing to people experiencing homelessness or unemployment due to incarceration  [facility] [address], [place], [place] [address] [phone] www.greensborourbanministry.org  **emergency and transitional housing, rent/mortgage assistance, utility assistance  [facility] [address] [place], [place]  [address] [phone] www.salvationarmyofgreensboro.org **emergency and transitional housing  [employer] [place], [place], [place] [address] [phone] Www.habitatgreensboro.[place], [place], [place] [address] [phone] https://chshousing.org **[place] [facility]  [facility] [employer] [facility] [address] [place] [address], [place], [place] [address] [phone] arrivance.com **home buyer education courses, foreclosure prevention  [place], [place], [place] [address] [phone] WirelessNovelties.no **Environmental Exposure Assessment (investigation of homes where either children or pregnant women with a confirmed elevated blood lead level reside)  [employer] of [facility] [place]  [place], [place] [address] [phone] http://www.perez.com/ **Home Expense Assistance/Repairs Program; offers home accessibility updates, such as ramps or bars in the bathroom  [facility] [address] [place] [address] [place], [place] [address] [phone] https://www.self-help.org/locations/[place]-branch **Offers credit-building and banking services to people unable to use traditional banking

## 2020-10-15 ENCOUNTER — Encounter: Payer: Self-pay | Admitting: Family Medicine

## 2020-10-28 NOTE — BH Specialist Note (Signed)
Integrated Behavioral Health via Telemedicine Visit  10/28/2020 Amber Rose [id number]   Less than 5 minutes by phone; pt is feeling better after recent medication change by her PCP, has continued to have panic attacks, after positive covid in household, but "they feel mellowed", not as severe, and manageable. Pt requests call-back from [facility] [employer] about picking up food.   [name] Antoneo Ghrist, LCSW

## 2020-10-30 ENCOUNTER — Ambulatory Visit: Payer: Medicare HMO | Attending: Family Medicine | Admitting: Family Medicine

## 2020-10-30 ENCOUNTER — Other Ambulatory Visit: Payer: Self-pay

## 2020-10-30 DIAGNOSIS — I10 Essential (primary) hypertension: Secondary | ICD-10-CM

## 2020-10-30 DIAGNOSIS — G8929 Other chronic pain: Secondary | ICD-10-CM

## 2020-10-30 DIAGNOSIS — M797 Fibromyalgia: Secondary | ICD-10-CM

## 2020-10-30 DIAGNOSIS — Z20822 Contact with and (suspected) exposure to covid-19: Secondary | ICD-10-CM

## 2020-10-30 DIAGNOSIS — M7540 Impingement syndrome of unspecified shoulder: Secondary | ICD-10-CM

## 2020-10-30 MED ORDER — PREGABALIN 100 MG PO CAPS: 100.0000 mg | ORAL_CAPSULE | Freq: Two times a day (BID) | ORAL | 3 refills | Status: DC

## 2020-10-30 MED ORDER — LISINOPRIL 10 MG PO TABS: 10.0000 mg | ORAL_TABLET | Freq: Every day | ORAL | 1 refills | Status: DC

## 2020-10-30 NOTE — Progress Notes (Signed)
Virtual Visit via Telephone Note  I connected with Amber Rose, on 10/30/2020 at 2:57 PM by telephone due to the COVID-19 pandemic and verified that I am speaking with the correct person using two identifiers.   Consent: I discussed the limitations, risks, security and privacy concerns of performing an evaluation and management service by telephone and the availability of in person appointments. I also discussed with the patient that there may be a patient responsible charge related to this service. The patient expressed understanding and agreed to proceed.   Location of Patient: Home  Location of Provider: Clinic   Persons participating in Telemedicine visit: Aleria Maheu Farrington-CMA Dr. Margarita Rana     History of Present Illness: Amber Rose is a 45 year old female with type 2 diabetes mellitus (A1c 6.8), hypertension, depression and anxiety, hydradenitis, tobacco abuse, fibromyalgiaseen for a follow up visit. Her room mate tested positive for COVID 3 days ago and she plans on getting tested in 3 days. She is congested but has no fever, myalgias. 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  Her L shoulder hurts radiating down her neck and upper back and Lyrica provided mild relief and she decided to add Gabapentin to Lyrica with greater relief experienced. Currently undergoing PT.  She is concerned about side effects of Amlodipine and Atorvastatin as she read they could cause joint pains and muscle pains, swelling given she has been experiencing a lot of that. She stopped Atorvastatin a few days ago and is wondering if Amlodipine can be changed Past Medical History:  Diagnosis Date  . Allergy   . Anxiety   . Arthritis   . Chronic UTI   . Depression   . Diabetes mellitus without complication ([place])   . Diverticulosis   . Fibromyalgia   . GERD (gastroesophageal reflux disease)   . Hiatal hernia   . Hyperlipidemia   . Hypertension   .  Internal hemorrhoids   . Meningitis, viral   . Pancreatitis    Allergies  Allergen Reactions  . Metformin And Related Nausea And Vomiting  . Metronidazole Nausea And Vomiting  . Penicillins     childhood  . Xanax [Alprazolam]     "Caused upper respiratory symptoms" per pt  . Glyburide Other (See Comments)    "blood sugar dropped uncontrollably"  . Linagliptin Diarrhea and Other (See Comments)    Stomach pain, sinus infection  . Moxifloxacin Diarrhea    Current Outpatient Medications on File Prior to Visit  Medication Sig Dispense Refill  . acyclovir (ZOVIRAX) 400 MG tablet Take 1 tablet (400 mg total) by mouth 2 (two) times daily. 180 tablet 1  . albuterol (PROAIR HFA) 108 (90 Base) MCG/ACT inhaler Inhale 2 puffs into the lungs every 4 (four) hours as needed for wheezing or shortness of breath. 3 Inhaler 0  . amLODipine (NORVASC) 10 MG tablet Take 1 tablet (10 mg total) by mouth daily. 90 tablet 1  . atorvastatin (LIPITOR) 20 MG tablet Take 1 tablet (20 mg total) by mouth daily. 90 tablet 1  . Blood Glucose Monitoring Suppl (ACCU-CHEK AVIVA PLUS) w/Device KIT USE AS DIRECTED DAILY. E11.9 1 kit 0  . ergocalciferol (DRISDOL) 1.25 MG (50000 UT) capsule Take 1 capsule (50,000 Units total) by mouth once a week. 12 capsule 0  . Exenatide ER (BYDUREON BCISE) 2 MG/0.85ML AUIJ INJECT 2 MG BY SUBCUTANEOUS ROUTE ONCE A WEEK 8 mL 1  . hydrOXYzine (ATARAX/VISTARIL) 25 MG tablet Take 1 tablet (25 mg total)  by mouth 3 (three) times daily as needed. 270 tablet 1  . ibuprofen (ADVIL) 600 MG tablet Take 1 tablet (600 mg total) by mouth every 8 (eight) hours as needed. 60 tablet 1  . Lancet Devices (ACCU-CHEK SOFTCLIX) lancets Use as instructed daily. 1 each 5  . lisinopril (ZESTRIL) 5 MG tablet TAKE 1 TABLET BY MOUTH EVERY DAY 90 tablet 1  . methocarbamol (ROBAXIN) 750 MG tablet Take 1 tablet (750 mg total) by mouth every 8 (eight) hours as needed for muscle spasms. 180 tablet 1  . Multiple Vitamin  (MULTI-VITAMIN DAILY) TABS Take 1 tablet by mouth daily as needed.    Amber Rose omeprazole (PRILOSEC) 40 MG capsule Take 1 tab every morning. 90 capsule 3  . pregabalin (LYRICA) 75 MG capsule Take 1 capsule (75 mg total) by mouth 2 (two) times daily. 60 capsule 3  . Continuous Blood Gluc Receiver ([place]) [place] 1 each by Does not apply route as directed. (Patient not taking: No sig reported) 1 each 0  . Continuous Blood Gluc Sensor (DEXCOM G6 SENSOR) MISC 1 each by Does not apply route as directed. (Patient not taking: No sig reported) 3 each 11  . Continuous Blood Gluc Transmit (DEXCOM G6 TRANSMITTER) MISC 1 each by Does not apply route as directed. (Patient not taking: No sig reported) 1 each 3  . nicotine (NICODERM CQ - DOSED IN MG/24 HOURS) 21 mg/24hr patch Place 21 mg onto the skin daily. (Patient not taking: No sig reported)    . predniSONE (DELTASONE) 20 MG tablet Take 1 tablet (20 mg total) by mouth daily with breakfast. (Patient not taking: Reported on 10/30/2020) 5 tablet 0   Current Facility-Administered Medications on File Prior to Visit  Medication Dose Route Frequency Provider Last Rate Last Admin  . 0.9 %  sodium chloride infusion  500 mL Intravenous Once Pyrtle, Lajuan Lines, MD        Observations/Objective: Awake, alert, oriented x3 Not in acute distress  Assessment and Plan: 1. Hypertension, unspecified type Controlled from last office visit Discontinue Amlodipine and increase Lisinopril dose per request Counseled on blood pressure goal of less than 130/80, low-sodium, DASH diet, medication compliance, 150 minutes of moderate intensity exercise per week. Discussed medication compliance, adverse effects. Will reasses BP at next visit alokng with labs - lisinopril (ZESTRIL) 10 MG tablet; Take 1 tablet (10 mg total) by mouth daily.  Dispense: 90 tablet; Refill: 1  2. Fibromyalgia Uncontrolled Could explain her chronic pain Advised against combining Lyrica with  Gabapentin Increased Lyrica dose Continue PT Hold off on statin due to myalgia - pregabalin (LYRICA) 100 MG capsule; Take 1 capsule (100 mg total) by mouth 2 (two) times daily.  Dispense: 60 capsule; Refill: 3  3. Impingement syndrome of shoulder region, unspecified laterality Uncontrolled Undergoing PT Consider referral to rehab med if increased Lyrica dose is ineffective - pregabalin (LYRICA) 100 MG capsule; Take 1 capsule (100 mg total) by mouth 2 (two) times daily.  Dispense: 60 capsule; Refill: 3  4. Other chronic pain See #3 and 4 - pregabalin (LYRICA) 100 MG capsule; Take 1 capsule (100 mg total) by mouth 2 (two) times daily.  Dispense: 60 capsule; Refill: 3  5. Exposure to confirmed case of Sars-CoV2 She will be getting tested in 3 days Advised to notify the clinic if she is symptomatic Unfortunately her living condition does does not allow for proper quarantine  Follow Up Instructions: 3 weeks BP and follow up of pain  I discussed the assessment and treatment plan with the patient. The patient was provided an opportunity to ask questions and all were answered. The patient agreed with the plan and demonstrated an understanding of the instructions.   The patient was advised to call back or seek an in-person evaluation if the symptoms worsen or if the condition fails to improve as anticipated.     I provided 22 minutes total of non-face-to-face time during this encounter including median intraservice time, reviewing previous notes, investigations, ordering medications, medical decision making, coordinating care and patient verbalized understanding at the end of the visit.     Charlott Rakes, MD, FAAFP. [facility] and [facility], [place]   10/30/2020, 2:57 PM

## 2020-10-30 NOTE — Progress Notes (Signed)
Wants to discuss amlodipine and Lipitor. Has been in close contact with a covid positive person.

## 2020-11-01 ENCOUNTER — Encounter: Payer: Self-pay | Admitting: Family Medicine

## 2020-11-04 ENCOUNTER — Other Ambulatory Visit: Payer: Self-pay

## 2020-11-04 ENCOUNTER — Ambulatory Visit: Payer: Medicare HMO | Admitting: Clinical

## 2020-11-04 DIAGNOSIS — Z658 Other specified problems related to psychosocial circumstances: Secondary | ICD-10-CM

## 2020-11-04 DIAGNOSIS — F419 Anxiety disorder, unspecified: Secondary | ICD-10-CM

## 2020-11-05 ENCOUNTER — Encounter: Payer: Self-pay | Admitting: Family Medicine

## 2020-11-06 ENCOUNTER — Encounter: Payer: Self-pay | Admitting: Family Medicine

## 2020-11-07 ENCOUNTER — Other Ambulatory Visit: Payer: Self-pay | Admitting: Family Medicine

## 2020-11-07 ENCOUNTER — Telehealth: Payer: Self-pay | Admitting: Pharmacist

## 2020-11-07 DIAGNOSIS — I10 Essential (primary) hypertension: Secondary | ICD-10-CM

## 2020-11-07 NOTE — Telephone Encounter (Signed)
Dr. Margarita Rana,   Touched base with the patient. She is not familiar with how her [place] works. She has already applied for aid and been denied d/t her current financial status.   I called her insurance and found the following:  1. Her high copays are not d/t her not meeting a deductible.  2. Her copays are high d/t mediations being in high tiers, specifically, her [place] and Lyrica.  I called the patient and discussed this with her. At this point, we will have to substitute flower tier generics since she cannot afford the copay. I discussed with her the option to change insurance but she doesn't feel that this is a good option for her.

## 2020-11-25 ENCOUNTER — Encounter: Payer: Self-pay | Admitting: General Practice

## 2020-11-25 ENCOUNTER — Other Ambulatory Visit: Payer: Self-pay

## 2020-11-25 ENCOUNTER — Other Ambulatory Visit: Payer: Self-pay | Admitting: Family Medicine

## 2020-11-25 ENCOUNTER — Ambulatory Visit: Payer: Medicare HMO | Attending: Family Medicine | Admitting: Family Medicine

## 2020-11-25 ENCOUNTER — Encounter: Payer: Self-pay | Admitting: Family Medicine

## 2020-11-25 ENCOUNTER — Telehealth: Payer: Self-pay

## 2020-11-25 VITALS — BP 154/82 | HR 76 | Ht 63.0 in | Wt 197.6 lb

## 2020-11-25 DIAGNOSIS — R399 Unspecified symptoms and signs involving the genitourinary system: Secondary | ICD-10-CM | POA: Diagnosis not present

## 2020-11-25 DIAGNOSIS — E1169 Type 2 diabetes mellitus with other specified complication: Secondary | ICD-10-CM | POA: Diagnosis not present

## 2020-11-25 DIAGNOSIS — I152 Hypertension secondary to endocrine disorders: Secondary | ICD-10-CM

## 2020-11-25 DIAGNOSIS — M791 Myalgia, unspecified site: Secondary | ICD-10-CM

## 2020-11-25 DIAGNOSIS — E785 Hyperlipidemia, unspecified: Secondary | ICD-10-CM | POA: Diagnosis not present

## 2020-11-25 DIAGNOSIS — E1159 Type 2 diabetes mellitus with other circulatory complications: Secondary | ICD-10-CM | POA: Diagnosis not present

## 2020-11-25 DIAGNOSIS — E1149 Type 2 diabetes mellitus with other diabetic neurological complication: Secondary | ICD-10-CM | POA: Diagnosis not present

## 2020-11-25 LAB — POCT URINALYSIS DIP (CLINITEK)
Bilirubin, UA: NEGATIVE
Glucose, UA: NEGATIVE mg/dL
Ketones, POC UA: NEGATIVE mg/dL
Leukocytes, UA: NEGATIVE
Nitrite, UA: NEGATIVE
POC PROTEIN,UA: NEGATIVE
Spec Grav, UA: 1.025 (ref 1.010–1.025)
Urobilinogen, UA: 0.2 E.U./dL
pH, UA: 7 (ref 5.0–8.0)

## 2020-11-25 LAB — POCT GLYCOSYLATED HEMOGLOBIN (HGB A1C): HbA1c, POC (controlled diabetic range): 6.8 % (ref 0.0–7.0)

## 2020-11-25 LAB — GLUCOSE, POCT (MANUAL RESULT ENTRY): POC Glucose: 207 mg/dl — AB (ref 70–99)

## 2020-11-25 MED ORDER — TRULICITY 0.75 MG/0.5ML ~~LOC~~ SOAJ: 0.7500 mg | SUBCUTANEOUS | 6 refills | Status: DC

## 2020-11-25 MED ORDER — HYDROCHLOROTHIAZIDE 25 MG PO TABS: 25.0000 mg | ORAL_TABLET | Freq: Every day | ORAL | 1 refills | Status: DC

## 2020-11-25 MED ORDER — GABAPENTIN 300 MG PO CAPS: 300.0000 mg | ORAL_CAPSULE | Freq: Three times a day (TID) | ORAL | 3 refills | Status: DC

## 2020-11-25 NOTE — Patient Instructions (Signed)
Muscle Pain, Adult Muscle pain, also called myalgia, is a condition in which a person has pain in one or more muscles in the body. Muscle pain may be mild, moderate, or severe. It may feel sharp, achy, or burning. In most cases, the pain lasts only a short time and goes away without treatment. Muscle pain can result from using muscles in a new or different way or after a period of inactivity. It is normal to feel some muscle pain after starting an exercise program. Muscles that have not been used often will be sore at first. What are the causes? This condition is caused by using muscles in a new or different way after a period of inactivity. Other causes may include:  Overuse or muscle strain, especially if you are not in shape. This is the most common cause of muscle pain.  Injury or bruising.  Infectious diseases, including diseases caused by viruses, such as the flu (influenza).  Fibromyalgia.This is a long-term, or chronic, condition that causes muscle tenderness, tiredness (fatigue), and headache.  Autoimmune or rheumatologic diseases. These are conditions, such as lupus, in which the body's defense system (immunesystem) attacks areas in the body.  Certain medicines, including ACE inhibitors and statins. What are the signs or symptoms? The main symptom of this condition is sore or painful muscles, including during activity and when stretching. You may also have slight swelling. How is this diagnosed? This condition is diagnosed with a physical exam. Your health care provider will ask questions about your pain and when it began. If you have not had muscle pain for very long, your health care provider may want to wait before doing much testing. If your muscle pain has lasted a long time, tests may be done right away. In some cases, this may include tests to rule out certain conditions or illnesses. How is this treated? Treatment for this condition depends on the cause. Home care is often  enough to relieve muscle pain. Your health care provider may also prescribe NSAIDs, such as ibuprofen. Follow these instructions at home: Medicines  Take over-the-counter and prescription medicines only as told by your health care provider.  Ask your health care provider if the medicine prescribed to you requires you to avoid driving or using machinery. Managing pain, swelling, and discomfort  If directed, put ice on the painful area. To do this: ? Put ice in a plastic bag. ? Place a towel between your skin and the bag. ? Leave the ice on for 20 minutes, 2-3 times a day.  For the first 2 days of muscle soreness, or if there is swelling: ? Do not soak in hot baths. ? Do not use a hot tub, steam room, sauna, heating pad, or other heat source.  After 48-72 hours, you may alternate between applying ice and applying heat as told by your health care provider. If directed, apply heat to the affected area as often as told by your health care provider. Use the heat source that your health care provider recommends, such as a moist heat pack or a heating pad. ? Place a towel between your skin and the heat source. ? Leave the heat on for 20-30 minutes. ? Remove the heat if your skin turns bright red. This is especially important if you are unable to feel pain, heat, or cold. You may have a greater risk of getting burned.  If you have an injury, raise (elevate) the injured area above the level of your heart while you  are sitting or lying down.      Activity  If overuse is causing your muscle pain: ? Slow down your activities until the pain goes away. ? Do regular, gentle exercises if you are not usually active. ? Warm up before exercising. Stretch before and after exercising. This can help lower the risk of muscle pain.  Do not continue working out if the pain is severe. Severe pain could mean that you have injured a muscle.  Do not lift anything that is heavier than 5-10 lb (2.3-4.5 kg), or  the limit that you are told, until your health care provider says that it is safe.  Return to your normal activities as told by your health care provider. Ask your health care provider what activities are safe for you.   General instructions  Do not use any products that contain nicotine or tobacco, such as cigarettes, e-cigarettes, and chewing tobacco. These can delay healing. If you need help quitting, ask your health care provider.  Keep all follow-up visits as told by your health care provider. This is important. Contact a health care provider if you have:  Muscle pain that gets worse and medicines do not help.  Muscle pain that lasts longer than 3 days.  A rash or fever along with muscle pain.  Muscle pain after a tick bite.  Muscle pain while working out, even though you are in good physical condition.  Redness, soreness, or swelling along with muscle pain.  Muscle pain after starting a new medicine or changing the dose of a medicine. Get help right away if you have:  Trouble breathing.  Trouble swallowing.  Muscle pain along with a stiff neck, fever, and vomiting.  Severe muscle weakness or you cannot move part of your body. These symptoms may represent a serious problem that is an emergency. Do not wait to see if the symptoms will go away. Get medical help right away. Call your local emergency services (911 in the [place] Do not drive yourself to the hospital. Summary  Muscle pain usually lasts only a short time and goes away without treatment.  This condition is caused by using muscles in a new or different way after a period of inactivity.  If your muscle pain lasts longer than 3 days, tell your health care provider. This information is not intended to replace advice given to you by your health care provider. Make sure you discuss any questions you have with your health care provider. Document Revised: 07/21/2019 Document Reviewed: 07/21/2019 Elsevier Patient  Education  2021 [employer].

## 2020-11-25 NOTE — Progress Notes (Signed)
Subjective:  Patient ID: Amber Rose, female    DOB: 04-22-1976  Age: 45 y.o. MRN: [id number]  CC: Diabetes   HPI Amber Rose is a 45 year old female with type 2 diabetes mellitus (A1c 6.8), hypertension, depression and anxiety, hydradenitis, tobacco abuse, fibromyalgiaseen fora follow up visit.  She was advised to hold off on her statin due to her myalgias at her last visit. She thinks she has had some improvement in her symptoms especially leg throbbing She has restless legs, electric shock down her legs, her legs won't move sometimes and feel like 'brick'.  Symptoms were initially thought to be secondary to her fibromyalgia. Her symptoms worsened after her birthday weekend when she did a lot of walking. Last night and today symptoms have been a bit better. She had been on Lyrica which was helping but she had issues with co-pay for her Lyrica which was in a higher tier and had to revert back to gabapentin which she used in the past. Her autoimmune labs so far have been negative, I had referred her to rheumatology in the past with declined referral.  Her symptoms typically improve after a short course of prednisone.  She had dried lips and slight swelling after Lisinopril was dose was increased but then states this has decreased.  She had requested discontinuation of amlodipine as she had read that amlodipine would cause muscle pain. I had referred her to the hypertension clinic due to intolerance of her antihypertensives.  With regards to her diabetes mellitus Bydureon will cost her a lot and she is wondering if she can be switched to Trulicity.  The pharmacy has verified this can be obtained to the patient assistance program. Past Medical History:  Diagnosis Date  . Allergy   . Anxiety   . Arthritis   . Chronic UTI   . Depression   . Diabetes mellitus without complication ([place])   . Diverticulosis   . Fibromyalgia   . GERD (gastroesophageal reflux disease)   .  Hiatal hernia   . Hyperlipidemia   . Hypertension   . Internal hemorrhoids   . Meningitis, viral   . Pancreatitis     Past Surgical History:  Procedure Laterality Date  . APPENDECTOMY  1990  . ceasarian  2002  . CESAREAN SECTION     x1  . CHOLECYSTECTOMY  2011  . COLONOSCOPY    . UPPER GASTROINTESTINAL ENDOSCOPY    . URINARY SURGERY     urethra sling, removal, and then revision 2016 (4 surgeries)  . WISDOM TOOTH EXTRACTION      Family History  Problem Relation Age of Onset  . Cancer Mother        cervical, breast, lungm skin, bladder-ex smoker  . Diabetes Father   . Multiple sclerosis Paternal Grandmother   . Allergic rhinitis Neg Hx   . Angioedema Neg Hx   . Asthma Neg Hx   . Eczema Neg Hx   . Urticaria Neg Hx   . Colon cancer Neg Hx   . Esophageal cancer Neg Hx   . Liver disease Neg Hx   . Pancreatic cancer Neg Hx   . Prostate cancer Neg Hx   . Rectal cancer Neg Hx   . Stomach cancer Neg Hx     Allergies  Allergen Reactions  . Metformin And Related Nausea And Vomiting  . Metronidazole Nausea And Vomiting  . Penicillins     childhood  . Xanax [Alprazolam]     "Caused upper respiratory symptoms" per  pt  . Glyburide Other (See Comments)    "blood sugar dropped uncontrollably"  . Linagliptin Diarrhea and Other (See Comments)    Stomach pain, sinus infection  . Moxifloxacin Diarrhea    Outpatient Medications Prior to Visit  Medication Sig Dispense Refill  . acyclovir (ZOVIRAX) 400 MG tablet Take 1 tablet (400 mg total) by mouth 2 (two) times daily. 180 tablet 1  . albuterol (PROAIR HFA) 108 (90 Base) MCG/ACT inhaler Inhale 2 puffs into the lungs every 4 (four) hours as needed for wheezing or shortness of breath. 3 Inhaler 0  . Blood Glucose Monitoring Suppl (ACCU-CHEK AVIVA PLUS) w/Device KIT USE AS DIRECTED DAILY. E11.9 1 kit 0  . ergocalciferol (DRISDOL) 1.25 MG (50000 UT) capsule Take 1 capsule (50,000 Units total) by mouth once a week. 12 capsule 0  .  hydrOXYzine (ATARAX/VISTARIL) 25 MG tablet Take 1 tablet (25 mg total) by mouth 3 (three) times daily as needed. 270 tablet 1  . ibuprofen (ADVIL) 600 MG tablet Take 1 tablet (600 mg total) by mouth every 8 (eight) hours as needed. 60 tablet 1  . Lancet Devices (ACCU-CHEK SOFTCLIX) lancets Use as instructed daily. 1 each 5  . lisinopril (ZESTRIL) 10 MG tablet Take 1 tablet (10 mg total) by mouth daily. 90 tablet 1  . methocarbamol (ROBAXIN) 750 MG tablet Take 1 tablet (750 mg total) by mouth every 8 (eight) hours as needed for muscle spasms. 180 tablet 1  . Multiple Vitamin (MULTI-VITAMIN DAILY) TABS Take 1 tablet by mouth daily as needed.    Marland Kitchen omeprazole (PRILOSEC) 40 MG capsule Take 1 tab every morning. 90 capsule 3  . Exenatide ER (BYDUREON BCISE) 2 MG/0.85ML AUIJ INJECT 2 MG BY SUBCUTANEOUS ROUTE ONCE A WEEK 8 mL 1  . atorvastatin (LIPITOR) 20 MG tablet Take 1 tablet (20 mg total) by mouth daily. (Patient not taking: Reported on 11/25/2020) 90 tablet 1  . Continuous Blood Gluc Receiver ([place]) [place] 1 each by Does not apply route as directed. (Patient not taking: No sig reported) 1 each 0  . Continuous Blood Gluc Sensor (DEXCOM G6 SENSOR) MISC 1 each by Does not apply route as directed. (Patient not taking: No sig reported) 3 each 11  . Continuous Blood Gluc Transmit (DEXCOM G6 TRANSMITTER) MISC 1 each by Does not apply route as directed. (Patient not taking: No sig reported) 1 each 3  . nicotine (NICODERM CQ - DOSED IN MG/24 HOURS) 21 mg/24hr patch Place 21 mg onto the skin daily. (Patient not taking: No sig reported)    . predniSONE (DELTASONE) 20 MG tablet Take 1 tablet (20 mg total) by mouth daily with breakfast. (Patient not taking: No sig reported) 5 tablet 0  . pregabalin (LYRICA) 100 MG capsule Take 1 capsule (100 mg total) by mouth 2 (two) times daily. (Patient not taking: Reported on 11/25/2020) 60 capsule 3   Facility-Administered Medications Prior to Visit  Medication Dose  Route Frequency Provider Last Rate Last Admin  . 0.9 %  sodium chloride infusion  500 mL Intravenous Once Pyrtle, Lajuan Lines, MD         ROS Review of Systems  Constitutional: Negative for activity change, appetite change and fatigue.  HENT: Negative for congestion, sinus pressure and sore throat.   Eyes: Negative for visual disturbance.  Respiratory: Negative for cough, chest tightness, shortness of breath and wheezing.   Cardiovascular: Negative for chest pain and palpitations.  Gastrointestinal: Negative for abdominal distention, abdominal pain and constipation.  Endocrine: Negative for polydipsia.  Genitourinary: Negative for dysuria and frequency.  Musculoskeletal:       See HPI  Skin: Negative for rash.  Neurological: Negative for tremors, light-headedness and numbness.  Hematological: Does not bruise/bleed easily.  Psychiatric/Behavioral: Negative for agitation and behavioral problems.    Objective:  BP (!) 154/82   Pulse 76   Ht _0  (1.6 m)   Wt 197 lb 9.6 oz (89.6 kg)   SpO2 99%   BMI 35.00 kg/m   BP/Weight 11/25/2020 26/83/4196 11/27/2977  Systolic BP 892 119 417  Diastolic BP 82 82 73  Wt. (Lbs) 197.6 190 192  BMI 35 33.66 34.01      Physical Exam Constitutional:      Appearance: She is well-developed.  Neck:     Vascular: No JVD.  Cardiovascular:     Rate and Rhythm: Normal rate.     Heart sounds: Normal heart sounds. No murmur heard.   Pulmonary:     Effort: Pulmonary effort is normal.     Breath sounds: Normal breath sounds. No wheezing or rales.  Chest:     Chest wall: No tenderness.  Abdominal:     General: Bowel sounds are normal. There is no distension.     Palpations: Abdomen is soft. There is no mass.     Tenderness: There is no abdominal tenderness.  Musculoskeletal:        General: Tenderness (out of proportion to pressure applied to thighs) present. Normal range of motion.     Right lower leg: No edema.     Left lower leg: No edema.   Neurological:     Mental Status: She is alert and oriented to person, place, and time.  Psychiatric:        Mood and Affect: Mood normal.     CMP Latest Ref Rng & Units 07/02/2020 11/08/2019 06/01/2019  Glucose 65 - 99 mg/dL 158(H) 110(H) 105(H)  BUN 6 - 24 mg/dL _1 Creatinine 0.57 - 1.00 mg/dL 0.70 0.62 0.69  Sodium 134 - 144 mmol/L 141 140 138  Potassium 3.5 - 5.2 mmol/L 3.9 4.8 4.1  Chloride 96 - 106 mmol/L 106 103 101  CO2 20 - 29 mmol/L _2 Calcium 8.7 - 10.2 mg/dL 8.9 9.4 9.6  Total Protein 6.0 - 8.5 g/dL 6.4 6.6 7.0  Total Bilirubin 0.0 - 1.2 mg/dL <0.2 <0.2 <0.2  Alkaline Phos 48 - 121 IU/L 105 100 106  AST 0 - 40 IU/L _3 ALT 0 - 32 IU/L _4 Lipid Panel     Component Value Date/Time   CHOL 179 11/08/2019 1019   TRIG 76 11/08/2019 1019   HDL 58 11/08/2019 1019   CHOLHDL 3.1 11/08/2019 1019   CHOLHDL 4.4 10/28/2016 0851   LDLCALC 107 (H) 11/08/2019 1019    CBC    Component Value Date/Time   WBC 13.8 (H) 06/01/2019 1532   WBC 9.4 10/12/2016 0517   RBC 4.57 06/01/2019 1532   RBC 4.10 10/12/2016 0517   HGB 11.8 06/01/2019 1532   HCT 37.8 06/01/2019 1532   PLT 413 06/01/2019 1532   MCV 83 06/01/2019 1532   MCH 25.8 (L) 06/01/2019 1532   MCH 28.0 10/12/2016 0517   MCHC 31.2 (L) 06/01/2019 1532   MCHC 33.1 10/12/2016 0517   RDW 17.6 (H) 06/01/2019 1532   LYMPHSABS 2.8 06/01/2019 1532   MONOABS 0.6 10/12/2016 0517   EOSABS 0.1  06/01/2019 1532   BASOSABS 0.1 06/01/2019 1532    Lab Results  Component Value Date   HGBA1C 6.8 11/25/2020    Assessment & Plan:  1. Type 2 diabetes mellitus with other neurologic complication, without long-term current use of insulin (HCC) Controlled with A1c of 6.8 Bydureon substituted with Trulicity due to cost as she is able to obtain the latter through the patient assistance program Counseled on Diabetic diet, my plate method, 621 minutes of moderate intensity exercise/week Blood sugar logs with  fasting goals of 80-120 mg/dl, random of less than 180 and in the event of sugars less than 60 mg/dl or greater than 400 mg/dl encouraged to notify the clinic. Advised on the need for annual eye exams, annual foot exams, Pneumonia vaccine. - POCT glucose (manual entry) - POCT glycosylated hemoglobin (Hb A1C) - gabapentin (NEURONTIN) 300 MG capsule; Take 1 capsule (300 mg total) by mouth 3 (three) times daily. Discontinue once Lyrica is obtained.  Dispense: 90 capsule; Refill: 3 - CMP14+EGFR; Future - Lipid panel; Future - Dulaglutide (TRULICITY) 3.08 MV/7.8IO SOPN; Inject 0.75 mg into the skin once a week. For 4 weeks then increase to 1.5 mg once a week thereafter.  Dispense: 2 mL; Refill: 6  2. Myalgia She states symptoms have improved with discontinuation of her statin Autoimmune labs previously done came back negative Underlying Fibromyalgia - gabapentin (NEURONTIN) 300 MG capsule; Take 1 capsule (300 mg total) by mouth 3 (three) times daily. Discontinue once Lyrica is obtained.  Dispense: 90 capsule; Refill: 3 - Ambulatory referral to Rheumatology  3. Hyperlipidemia associated with type 2 diabetes mellitus ([place]) LDL is above goal  She is unable to tolerate Statin Might need to be placed on a PCSK9 inhibitor  4. Hypertension associated with diabetes ([place]) Uncontrolled She has had complaints about several antihypertensives Currently taking Lisinopril She has an upcoming appointment with the Hypertension Clinic HCTZ added Counseled on blood pressure goal of less than 130/80, low-sodium, DASH diet, medication compliance, 150 minutes of moderate intensity exercise per week. Discussed medication compliance, adverse effects. - hydrochlorothiazide (HYDRODIURIL) 25 MG tablet; Take 1 tablet (25 mg total) by mouth daily.  Dispense: 90 tablet; Refill: 1  5. UTI symptoms UA negative for UTI - POCT URINALYSIS DIP (CLINITEK)    Meds ordered this encounter  Medications  .  hydrochlorothiazide (HYDRODIURIL) 25 MG tablet    Sig: Take 1 tablet (25 mg total) by mouth daily.    Dispense:  90 tablet    Refill:  1  . DISCONTD: Dulaglutide (TRULICITY) 9.62 XB/2.8UX SOPN    Sig: Inject 0.75 mg into the skin once a week. For 4 weeks then increase to 1.5 mg once a week thereafter.    Dispense:  2 mL    Refill:  6    Discontinue Bydureon  . gabapentin (NEURONTIN) 300 MG capsule    Sig: Take 1 capsule (300 mg total) by mouth 3 (three) times daily. Discontinue once Lyrica is obtained.    Dispense:  90 capsule    Refill:  3  . Dulaglutide (TRULICITY) 3.24 MW/1.0UV SOPN    Sig: Inject 0.75 mg into the skin once a week. For 4 weeks then increase to 1.5 mg once a week thereafter.    Dispense:  2 mL    Refill:  6    Discontinue Bydureon    Follow-up: Return in about 3 months (around 02/22/2021) for Chronic disease management.       Charlott Rakes, MD, FAAFP. [facility] [place], [place]   11/25/2020, 12:20 PM

## 2020-11-25 NOTE — Progress Notes (Signed)
Having pains in both legs.

## 2020-11-25 NOTE — Telephone Encounter (Signed)
After further investigation, Amber Rose would qualify for assistance for Bydureon Bcise through the [place] Patient Savings Program.  I spoke with Amber Rose over the phone and she would prefer to stay on the Bydureon at this time.  She states she will come to [facility] to sign the patient portion of the application on 62/03/55 and then the application will be submitted to [facility] for processing at that time.

## 2020-11-26 ENCOUNTER — Ambulatory Visit: Payer: Medicare HMO | Attending: Family Medicine

## 2020-11-26 DIAGNOSIS — E1149 Type 2 diabetes mellitus with other diabetic neurological complication: Secondary | ICD-10-CM

## 2020-11-26 NOTE — Telephone Encounter (Signed)
Trulicity has been switched to Bydureon.

## 2020-11-27 LAB — CMP14+EGFR
ALT: 15 IU/L (ref 0–32)
AST: 13 IU/L (ref 0–40)
Albumin/Globulin Ratio: 1.5 (ref 1.2–2.2)
Albumin: 3.8 g/dL (ref 3.8–4.8)
Alkaline Phosphatase: 83 IU/L (ref 44–121)
BUN/Creatinine Ratio: 18 (ref 9–23)
BUN: 13 mg/dL (ref 6–24)
Bilirubin Total: <0.2 mg/dL (ref 0.0–1.2)
CO2: 23 mmol/L (ref 20–29)
Calcium: 8.9 mg/dL (ref 8.7–10.2)
Chloride: 106 mmol/L (ref 96–106)
Creatinine, Ser: 0.72 mg/dL (ref 0.57–1.00)
GFR calc Af Amer: 117 mL/min/{1.73_m2} (ref >59)
GFR calc non Af Amer: 101 mL/min/{1.73_m2} (ref >59)
Globulin, Total: 2.5 g/dL (ref 1.5–4.5)
Glucose: 142 mg/dL — ABNORMAL HIGH (ref 65–99)
Potassium: 4.9 mmol/L (ref 3.5–5.2)
Sodium: 139 mmol/L (ref 134–144)
Total Protein: 6.3 g/dL (ref 6.0–8.5)

## 2020-11-27 LAB — LIPID PANEL
Chol/HDL Ratio: 4.1 ratio (ref 0.0–4.4)
Cholesterol, Total: 244 mg/dL — ABNORMAL HIGH (ref 100–199)
HDL: 59 mg/dL (ref >39)
LDL Chol Calc (NIH): 169 mg/dL — ABNORMAL HIGH (ref 0–99)
Triglycerides: 94 mg/dL (ref 0–149)
VLDL Cholesterol Cal: 16 mg/dL (ref 5–40)

## 2020-11-29 ENCOUNTER — Telehealth: Payer: Self-pay

## 2020-11-29 NOTE — Telephone Encounter (Signed)
PATIENT ASSISTANCE FOR BYDUREON BCISE HAS BEEN APPROVED UNTIL 10/25/21.  MEDICATION WILL BE SHIPPED TO [place] IN 5-7 BUSINESS DAYS.

## 2020-12-05 MED FILL — $BYDUREON BCise 2MG/0.85ML: 2 | 56 days supply | Qty: 7 | Fill #0

## 2020-12-12 ENCOUNTER — Ambulatory Visit (INDEPENDENT_AMBULATORY_CARE_PROVIDER_SITE_OTHER): Payer: Medicare HMO | Admitting: Cardiovascular Disease

## 2020-12-12 ENCOUNTER — Other Ambulatory Visit: Payer: Self-pay

## 2020-12-12 ENCOUNTER — Encounter: Payer: Self-pay | Admitting: Cardiovascular Disease

## 2020-12-12 VITALS — BP 124/80 | HR 74 | Ht 63.5 in | Wt 188.0 lb

## 2020-12-12 DIAGNOSIS — E7800 Pure hypercholesterolemia, unspecified: Secondary | ICD-10-CM

## 2020-12-12 DIAGNOSIS — I10 Essential (primary) hypertension: Secondary | ICD-10-CM

## 2020-12-12 DIAGNOSIS — E785 Hyperlipidemia, unspecified: Secondary | ICD-10-CM | POA: Diagnosis not present

## 2020-12-12 DIAGNOSIS — Z5181 Encounter for therapeutic drug level monitoring: Secondary | ICD-10-CM | POA: Diagnosis not present

## 2020-12-12 MED ORDER — ROSUVASTATIN CALCIUM 5 MG PO TABS: 5.0000 mg | ORAL_TABLET | Freq: Every day | ORAL | 3 refills | Status: DC

## 2020-12-12 NOTE — Progress Notes (Signed)
Hypertension Clinic Initial Assessment:    Date:  12/23/2020   ID:  Amber Rose, DOB 1976/03/30, MRN [id number]  PCP:  Charlott Rakes, MD  Cardiologist:  No primary care provider on file.  Nephrologist:  Referring MD: Charlott Rakes, MD   CC: Hypertension  History of Present Illness:    Amber Rose is a 45 y.o. female with a hx of hypertension, diabetes, tobacco abuse, aorta atherosclerosis, fibromyalgia, depression, and anxiety here to establish care in the hypertension clinic.  She was first diagnosed with hypertension in her 46. Her BP was well-controlled but she stopped taking amlodipine.  She last saw her PCP on 10/2020.  At that appointment her blood pressure was 154/82.  Hydrochlorothiazide was added to her regimen at that time.  For the last five years she has struggled with joint aches and pain.  The pain was so bad she was unable to walk at times.  She exercise daily with Youtube videos for 30 minutes daily.  She gets spasms in her back about twice per week.  She gets short of breath if she exerts too much.  She feels tightness and itchiness in her legs but no observable swelling.  She sometimes has facial swelling around her eyes at times.  She notices it more the week before her menstrual cycle.  She has no perioral edema.  It didn't improve when she was off lisinopril.  She both eats out and cooks.  She struggles with salt intake.  She has no caffeine or EtOH.  She uses ibuprofen every other day, up to every 8 hours for a week.  She is unsure if she snores.  She does report daytime somnolence.  She has smoked for 25 years.  She was able to quit when she was pregnant.  She has tried Wellbutrin which works for about a month.  She also tried patches.  She didn't tolerate atorvastatin in the past due to myalgias   Previous antihypertensives: Amlodipine   Past Medical History:  Diagnosis Date  . Allergy   . Anxiety   . Arthritis   . Chronic UTI   . Depression   .  Diabetes mellitus without complication ([place])   . Diverticulosis   . Fibromyalgia   . GERD (gastroesophageal reflux disease)   . Hiatal hernia   . Hyperlipidemia   . Hypertension   . Internal hemorrhoids   . Meningitis, viral   . Pancreatitis   . Pure hypercholesterolemia 12/23/2020    Past Surgical History:  Procedure Laterality Date  . APPENDECTOMY  1990  . ceasarian  2002  . CESAREAN SECTION     x1  . CHOLECYSTECTOMY  2011  . COLONOSCOPY    . UPPER GASTROINTESTINAL ENDOSCOPY    . URINARY SURGERY     urethra sling, removal, and then revision 2016 (4 surgeries)  . WISDOM TOOTH EXTRACTION      Current Medications: Current Meds  Medication Sig  . acyclovir (ZOVIRAX) 400 MG tablet Take 1 tablet (400 mg total) by mouth 2 (two) times daily.  Marland Kitchen albuterol (PROAIR HFA) 108 (90 Base) MCG/ACT inhaler Inhale 2 puffs into the lungs every 4 (four) hours as needed for wheezing or shortness of breath.  . Blood Glucose Monitoring Suppl (ACCU-CHEK AVIVA PLUS) w/Device KIT USE AS DIRECTED DAILY. E11.9  . ergocalciferol (DRISDOL) 1.25 MG (50000 UT) capsule Take 1 capsule (50,000 Units total) by mouth once a week.  . Exenatide ER (BYDUREON) 2 MG PEN Inject into the skin once a  week.  . gabapentin (NEURONTIN) 300 MG capsule Take 1 capsule (300 mg total) by mouth 3 (three) times daily. Discontinue once Lyrica is obtained.  . hydrOXYzine (ATARAX/VISTARIL) 25 MG tablet Take 1 tablet (25 mg total) by mouth 3 (three) times daily as needed.  Marland Kitchen ibuprofen (ADVIL) 600 MG tablet Take 1 tablet (600 mg total) by mouth every 8 (eight) hours as needed.  [name] Devices (ACCU-CHEK SOFTCLIX) lancets Use as instructed daily.  . methocarbamol (ROBAXIN) 750 MG tablet Take 1 tablet (750 mg total) by mouth every 8 (eight) hours as needed for muscle spasms.  . Multiple Vitamin (MULTI-VITAMIN DAILY) TABS Take 1 tablet by mouth daily as needed.  Marland Kitchen omeprazole (PRILOSEC) 40 MG capsule Take 1 tab every morning.  .  rosuvastatin (CRESTOR) 5 MG tablet Take 1 tablet (5 mg total) by mouth daily.  . [DISCONTINUED] hydrochlorothiazide (HYDRODIURIL) 25 MG tablet Take 1 tablet (25 mg total) by mouth daily.  . [DISCONTINUED] lisinopril (ZESTRIL) 10 MG tablet Take 1 tablet (10 mg total) by mouth daily.   Current Facility-Administered Medications for the 12/12/20 encounter (Office Visit) with Skeet Latch, MD  Medication  . 0.9 %  sodium chloride infusion     Allergies:   Atorvastatin, Metformin and related, Metronidazole, Penicillins, Xanax [alprazolam], Glyburide, Linagliptin, and Moxifloxacin   Social History   Socioeconomic History  . Marital status: Single    Spouse name: Not on file  . Number of children: 3  . Years of education: Not on file  . Highest education level: Not on file  Occupational History  . Not on file  Tobacco Use  . Smoking status: Current Every Day Smoker    Packs/day: 1.00    Years: 20.00    Pack years: 20.00    Types: Cigarettes  . Smokeless tobacco: Never Used  Vaping Use  . Vaping Use: Never used  Substance and Sexual Activity  . Alcohol use: Yes    Comment: rarely  . Drug use: Yes    Types: Marijuana    Comment: last smoker 11-14-17  . Sexual activity: Never    Comment: pt on her mentral cycle now began 11-14-17  Other Topics Concern  . Not on file  Social History Narrative   Lives home with adult niece.  Not working.  Disability pending.  Pt is single.  Education GED.  3 children.    Social Determinants of Health   Financial Resource Strain: Not on file  Food Insecurity: Food Insecurity Present  . Worried About Charity fundraiser in the Last Year: Sometimes true  . Ran Out of Food in the Last Year: Sometimes true  Transportation Needs: No Transportation Needs  . Lack of Transportation (Medical): No  . Lack of Transportation (Non-Medical): No  Physical Activity: Not on file  Stress: Not on file  Social Connections: Not on file     Family  History: The patient's family history includes Cancer in her mother; Diabetes in her father; Heart attack in her maternal grandfather; Hypertension in her mother; Multiple sclerosis in her paternal grandmother. There is no history of Allergic rhinitis, Angioedema, Asthma, Eczema, Urticaria, Colon cancer, Esophageal cancer, Liver disease, Pancreatic cancer, Prostate cancer, Rectal cancer, or Stomach cancer.  ROS:   Please see the history of present illness.    All other systems reviewed and are negative.  EKGs/Labs/Other Studies Reviewed:    EKG:  EKG is ordered today.  The ekg ordered today demonstrates sinus rhythm.  Rate 74 bpm.  Recent Labs: 11/26/2020: ALT 15 12/12/2020: BUN 11; Creatinine, Ser 0.61; Magnesium 2.1; Potassium 4.3; Sodium 138; TSH 1.090   Recent Lipid Panel    Component Value Date/Time   CHOL 244 (H) 11/26/2020 0945   TRIG 94 11/26/2020 0945   HDL 59 11/26/2020 0945   CHOLHDL 4.1 11/26/2020 0945   CHOLHDL 4.4 10/28/2016 0851   LDLCALC 169 (H) 11/26/2020 0945    Physical Exam:    VS:  BP 124/80 (BP Location: Left Arm, Patient Position: Sitting, Cuff Size: Large)   Pulse 74   Ht 5' 3.5" (1.613 m)   Wt 188 lb (85.3 kg)   SpO2 97%   BMI 32.78 kg/m  , BMI Body mass index is 32.78 kg/m. GENERAL:  Well appearing HEENT: Pupils equal round and reactive, fundi not visualized, oral mucosa unremarkable NECK:  No jugular venous distention, waveform within normal limits, carotid upstroke brisk and symmetric, no bruits LUNGS:  Clear to auscultation bilaterally HEART:  RRR.  PMI not displaced or sustained,S1 and S2 within normal limits, no S3, no S4, no clicks, no rubs, no murmurs ABD:  Flat, positive bowel sounds normal in frequency in pitch, no bruits, no rebound, no guarding, no midline pulsatile mass, no hepatomegaly, no splenomegaly EXT:  2 plus pulses throughout, no edema, no cyanosis no clubbing SKIN:  No rashes no nodules NEURO:  Cranial nerves II through XII  grossly intact, motor grossly intact throughout PSYCH:  Cognitively intact, oriented to person place and time   ASSESSMENT:    1. Benign essential hypertension   2. Hyperlipidemia, unspecified hyperlipidemia type   3. Therapeutic drug monitoring   4. Pure hypercholesterolemia     PLAN:    # Essential hypertension:  Blood pressure today is actually well-controlled on lisinopril and HCTZ.  Will focus on diet and exercise interventions.  Work on reducing sodium intake.  She was given information about the [employer].    She is interested in enrolling in the PREP exercise and nutrition program through the [facility].  Work on smoking cessation.  Screening for Secondary Hypertension:  Causes 12/23/2020  Drugs/Herbals Screened     - Comments NSAIDS  Renovascular HTN Screened  Sleep Apnea Screened  Hyperthyroidism Screened     - Comments check TSH  Hypothyroidism Screened     - Comments check TSH  Hyperaldosteronism Not Screened     - Comments CT w/o adenoma 2021  Pheochromocytoma Not Screened  Cushing's Syndrome Not Screened  Hyperparathyroidism Not Screened  Coarctation of the Aorta Screened     - Comments BP symmetric  Compliance Screened    Relevant Labs/Studies: Basic Labs Latest Ref Rng & Units 12/12/2020 11/26/2020 07/02/2020  Sodium 134 - 144 mmol/L 138 139 141  Potassium 3.5 - 5.2 mmol/L 4.3 4.9 3.9  Creatinine 0.57 - 1.00 mg/dL 0.61 0.72 0.70    Thyroid  Latest Ref Rng & Units 06/01/2019 12/12/2020  TSH 0.450 - 4.500 uIU/mL 2.160 1.090               #Hyperlipidemia: Did not tolerate atorvastatin in the past.  We will start rosuvastatin 5 mg daily.  Check fasting lipids and a CMP in 3 months.  # Tobacco abuse:   Referred to care guide for smoking cessation  Disposition:    FU with MD/PharmD in 3 months   Medication Adjustments/Labs and Tests Ordered: Current medicines are reviewed at length with the patient today.  Concerns regarding medicines are outlined above.   Orders Placed This Encounter  Procedures  . TSH  . Basic metabolic panel  . Magnesium  . Lipid panel  . Comprehensive metabolic panel  . EKG 12-Lead   Meds ordered this encounter  Medications  . rosuvastatin (CRESTOR) 5 MG tablet    Sig: Take 1 tablet (5 mg total) by mouth daily.    Dispense:  90 tablet    Refill:  3     Signed, Skeet Latch, MD  12/23/2020 10:48 AM    [place]

## 2020-12-12 NOTE — Patient Instructions (Signed)
Medication Instructions:  START ROSUVASTATIN 5 MG DAILY    Labwork: TSH/BMET/MAGNESIUM TODAY   FASTING LP/CMET IN 3 MONTHS    Testing/Procedures: NONE   Follow-Up: 02/10/2021 AT 10:30 AM WITH PHARM D   You will receive a phone call from the [facility] exercise and nutrition program to schedule an initial assessment.   Special Instructions:   MONITOR YOU BLOOD PRESSURE DAILY AND BRING READINGS TO FOLLOW UP   AMY WILL REACH OUT TO YOU TO DISCUSS SMOKING   DASH Eating Plan DASH stands for "Dietary Approaches to Stop Hypertension." The DASH eating plan is a healthy eating plan that has been shown to reduce high blood pressure (hypertension). It may also reduce your risk for type 2 diabetes, heart disease, and stroke. The DASH eating plan may also help with weight loss. What are tips for following this plan?  General guidelines  Avoid eating more than 2,300 mg (milligrams) of salt (sodium) a day. If you have hypertension, you may need to reduce your sodium intake to 1,500 mg a day.  Limit alcohol intake to no more than 1 drink a day for nonpregnant women and 2 drinks a day for men. One drink equals 12 oz of beer, 5 oz of wine, or 1 oz of hard liquor.  Work with your health care provider to maintain a healthy body weight or to lose weight. Ask what an ideal weight is for you.  Get at least 30 minutes of exercise that causes your heart to beat faster (aerobic exercise) most days of the week. Activities may include walking, swimming, or biking.  Work with your health care provider or diet and nutrition specialist (dietitian) to adjust your eating plan to your individual calorie needs. Reading food labels   Check food labels for the amount of sodium per serving. Choose foods with less than 5 percent of the Daily Value of sodium. Generally, foods with less than 300 mg of sodium per serving fit into this eating plan.  To find whole grains, look for the word "whole" as the first word in the  ingredient list. Shopping  Buy products labeled as "low-sodium" or "no salt added."  Buy fresh foods. Avoid canned foods and premade or frozen meals. Cooking  Avoid adding salt when cooking. Use salt-free seasonings or herbs instead of table salt or sea salt. Check with your health care provider or pharmacist before using salt substitutes.  Do not fry foods. Cook foods using healthy methods such as baking, boiling, grilling, and broiling instead.  Cook with heart-healthy oils, such as olive, canola, soybean, or sunflower oil. Meal planning  Eat a balanced diet that includes: ? 5 or more servings of fruits and vegetables each day. At each meal, try to fill half of your plate with fruits and vegetables. ? Up to 6-8 servings of whole grains each day. ? Less than 6 oz of lean meat, poultry, or fish each day. A 3-oz serving of meat is about the same size as a deck of cards. One egg equals 1 oz. ? 2 servings of low-fat dairy each day. ? A serving of nuts, seeds, or beans 5 times each week. ? Heart-healthy fats. Healthy fats called Omega-3 fatty acids are found in foods such as flaxseeds and coldwater fish, like sardines, salmon, and mackerel.  Limit how much you eat of the following: ? Canned or prepackaged foods. ? Food that is high in trans fat, such as fried foods. ? Food that is high in saturated fat, such  as fatty meat. ? Sweets, desserts, sugary drinks, and other foods with added sugar. ? Full-fat dairy products.  Do not salt foods before eating.  Try to eat at least 2 vegetarian meals each week.  Eat more home-cooked food and less restaurant, buffet, and fast food.  When eating at a restaurant, ask that your food be prepared with less salt or no salt, if possible. What foods are recommended? The items listed may not be a complete list. Talk with your dietitian about what dietary choices are best for you. Grains Whole-grain or whole-wheat bread. Whole-grain or whole-wheat  pasta. Brown rice. [place]. Bulgur. Whole-grain and low-sodium cereals. Pita bread. Low-fat, low-sodium crackers. Whole-wheat flour tortillas. Vegetables Fresh or frozen vegetables (raw, steamed, roasted, or grilled). Low-sodium or reduced-sodium tomato and vegetable juice. Low-sodium or reduced-sodium tomato sauce and tomato paste. Low-sodium or reduced-sodium canned vegetables. Fruits All fresh, dried, or frozen fruit. Canned fruit in natural juice (without added sugar). Meat and other protein foods Skinless chicken or [place]. Ground chicken or [place]. Pork with fat trimmed off. Fish and seafood. Egg whites. Dried beans, peas, or lentils. Unsalted nuts, nut butters, and seeds. Unsalted canned beans. Lean cuts of beef with fat trimmed off. Low-sodium, lean deli meat. Dairy Low-fat (1%) or fat-free (skim) milk. Fat-free, low-fat, or reduced-fat cheeses. Nonfat, low-sodium ricotta or cottage cheese. Low-fat or nonfat yogurt. Low-fat, low-sodium cheese. Fats and oils Soft margarine without trans fats. Vegetable oil. Low-fat, reduced-fat, or light mayonnaise and salad dressings (reduced-sodium). Canola, safflower, olive, soybean, and sunflower oils. Avocado. Seasoning and other foods Herbs. Spices. Seasoning mixes without salt. Unsalted popcorn and pretzels. Fat-free sweets. What foods are not recommended? The items listed may not be a complete list. Talk with your dietitian about what dietary choices are best for you. Grains Baked goods made with fat, such as croissants, muffins, or some breads. Dry pasta or rice meal packs. Vegetables Creamed or fried vegetables. Vegetables in a cheese sauce. Regular canned vegetables (not low-sodium or reduced-sodium). Regular canned tomato sauce and paste (not low-sodium or reduced-sodium). Regular tomato and vegetable juice (not low-sodium or reduced-sodium). Angie Fava. Olives. Fruits Canned fruit in a light or heavy syrup. Fried fruit. Fruit in cream or  butter sauce. Meat and other protein foods Fatty cuts of meat. Ribs. Fried meat. Berniece Salines. Sausage. Bologna and other processed lunch meats. Salami. Fatback. Hotdogs. Bratwurst. Salted nuts and seeds. Canned beans with added salt. Canned or smoked fish. Whole eggs or egg yolks. Chicken or [place] with skin. Dairy Whole or 2% milk, cream, and half-and-half. Whole or full-fat cream cheese. Whole-fat or sweetened yogurt. Full-fat cheese. Nondairy creamers. Whipped toppings. Processed cheese and cheese spreads. Fats and oils Butter. Stick margarine. Lard. Shortening. Ghee. Bacon fat. Tropical oils, such as coconut, palm kernel, or palm oil. Seasoning and other foods Salted popcorn and pretzels. Onion salt, garlic salt, seasoned salt, table salt, and sea salt. Worcestershire sauce. Tartar sauce. Barbecue sauce. Teriyaki sauce. Soy sauce, including reduced-sodium. Steak sauce. Canned and packaged gravies. Fish sauce. Oyster sauce. Cocktail sauce. Horseradish that you find on the shelf. Ketchup. Mustard. Meat flavorings and tenderizers. Bouillon cubes. Hot sauce and Tabasco sauce. Premade or packaged marinades. Premade or packaged taco seasonings. Relishes. Regular salad dressings. Where to find more information:  National Heart, Lung, and [place]: https://wilson-eaton.com/  American Heart Association: www.heart.org Summary  The DASH eating plan is a healthy eating plan that has been shown to reduce high blood pressure (hypertension). It may also reduce your risk  for type 2 diabetes, heart disease, and stroke.  With the DASH eating plan, you should limit salt (sodium) intake to 2,300 mg a day. If you have hypertension, you may need to reduce your sodium intake to 1,500 mg a day.  When on the DASH eating plan, aim to eat more fresh fruits and vegetables, whole grains, lean proteins, low-fat dairy, and heart-healthy fats.  Work with your health care provider or diet and nutrition specialist (dietitian) to  adjust your eating plan to your individual calorie needs. This information is not intended to replace advice given to you by your health care provider. Make sure you discuss any questions you have with your health care provider. Document Released: 10/01/2011 Document Revised: 09/24/2017 Document Reviewed: 10/05/2016 Elsevier Patient Education  2020 [employer].

## 2020-12-13 ENCOUNTER — Telehealth: Payer: Self-pay

## 2020-12-13 LAB — BASIC METABOLIC PANEL WITH GFR
BUN/Creatinine Ratio: 18 (ref 9–23)
BUN: 11 mg/dL (ref 6–24)
CO2: 21 mmol/L (ref 20–29)
Calcium: 9.4 mg/dL (ref 8.7–10.2)
Chloride: 98 mmol/L (ref 96–106)
Creatinine, Ser: 0.61 mg/dL (ref 0.57–1.00)
GFR calc Af Amer: 127 mL/min/1.73 (ref >59)
GFR calc non Af Amer: 110 mL/min/1.73 (ref >59)
Glucose: 101 mg/dL — ABNORMAL HIGH (ref 65–99)
Potassium: 4.3 mmol/L (ref 3.5–5.2)
Sodium: 138 mmol/L (ref 134–144)

## 2020-12-13 LAB — TSH: TSH: 1.09 u[IU]/mL (ref 0.450–4.500)

## 2020-12-13 LAB — MAGNESIUM: Magnesium: 2.1 mg/dL (ref 1.6–2.3)

## 2020-12-13 NOTE — Telephone Encounter (Signed)
Call to patient reference PRE[facility] referral.  Requested I call back later (phone battery low). Will contact her next week to discuss.

## 2020-12-14 ENCOUNTER — Encounter: Payer: Self-pay | Admitting: Family Medicine

## 2020-12-16 DIAGNOSIS — I10 Essential (primary) hypertension: Secondary | ICD-10-CM

## 2020-12-19 MED ORDER — LISINOPRIL 10 MG PO TABS: 20.0000 mg | ORAL_TABLET | Freq: Every day | ORAL | 0 refills | Status: DC

## 2020-12-19 NOTE — Telephone Encounter (Signed)
The cramping in her hands and legs, as well as the back pain could be related to hctz, though not common - it could be due to poor hydration.  The menstrual issues are probably not related.   Have her d/c the hctz for now and increase lisinopril to 20 mg qd

## 2020-12-23 ENCOUNTER — Encounter: Payer: Self-pay | Admitting: Cardiovascular Disease

## 2020-12-23 ENCOUNTER — Telehealth: Payer: Self-pay

## 2020-12-23 DIAGNOSIS — E7800 Pure hypercholesterolemia, unspecified: Secondary | ICD-10-CM

## 2020-12-23 DIAGNOSIS — Z0000 Encounter for general adult medical examination without abnormal findings: Secondary | ICD-10-CM

## 2020-12-23 HISTORY — DX: Pure hypercholesterolemia, unspecified: E78.00

## 2020-12-23 NOTE — Telephone Encounter (Signed)
Called patient to discuss health coaching for smoking cessation per Dr. Blenda Mounts referral. Patient is interested and has been scheduled for an in-person appointment on 12/26/20 at 1:00pm.

## 2020-12-26 ENCOUNTER — Other Ambulatory Visit: Payer: Self-pay

## 2020-12-26 ENCOUNTER — Ambulatory Visit (INDEPENDENT_AMBULATORY_CARE_PROVIDER_SITE_OTHER): Payer: Self-pay

## 2020-12-26 DIAGNOSIS — Z0000 Encounter for general adult medical examination without abnormal findings: Secondary | ICD-10-CM

## 2020-12-26 NOTE — Patient Instructions (Signed)
Steps to Quit Smoking Smoking tobacco is the leading cause of preventable death. It can affect almost every organ in the body. Smoking puts you and people around you at risk for many serious, long-lasting (chronic) diseases. Quitting smoking can be hard, but it is one of the best things that you can do for your health. It is never too late to quit. How do I get ready to quit? When you decide to quit smoking, make a plan to help you succeed. Before you quit:  Pick a date to quit. Set a date within the next 2 weeks to give you time to prepare.  Write down the reasons why you are quitting. Keep this list in places where you will see it often.  Tell your family, friends, and co-workers that you are quitting. Their support is important.  Talk with your doctor about the choices that may help you quit.  Find out if your health insurance will pay for these treatments.  Know the people, places, things, and activities that make you want to smoke (triggers). Avoid them. What first steps can I take to quit smoking?  Throw away all cigarettes at home, at work, and in your car.  Throw away the things that you use when you smoke, such as ashtrays and lighters.  Clean your car. Make sure to empty the ashtray.  Clean your home, including curtains and carpets. What can I do to help me quit smoking? Talk with your doctor about taking medicines and seeing a counselor at the same time. You are more likely to succeed when you do both.  If you are pregnant or breastfeeding, talk with your doctor about counseling or other ways to quit smoking. Do not take medicine to help you quit smoking unless your doctor tells you to do so. To quit smoking: Quit right away  Quit smoking totally, instead of slowly cutting back on how much you smoke over a period of time.  Go to counseling. You are more likely to quit if you go to counseling sessions regularly. Take medicine You may take medicines to help you quit. Some  medicines need a prescription, and some you can buy over-the-counter. Some medicines may contain a drug called nicotine to replace the nicotine in cigarettes. Medicines may:  Help you to stop having the desire to smoke (cravings).  Help to stop the problems that come when you stop smoking (withdrawal symptoms). Your doctor may ask you to use:  Nicotine patches, gum, or lozenges.  Nicotine inhalers or sprays.  Non-nicotine medicine that is taken by mouth. Find resources Find resources and other ways to help you quit smoking and remain smoke-free after you quit. These resources are most helpful when you use them often. They include:  Online chats with a counselor.  Phone quitlines.  Printed self-help materials.  Support groups or group counseling.  Text messaging programs.  Mobile phone apps. Use apps on your mobile phone or tablet that can help you stick to your quit plan. There are many free apps for mobile phones and tablets as well as websites. Examples include Quit Guide from the CDC and smokefree.gov   What things can I do to make it easier to quit?  Talk to your family and friends. Ask them to support and encourage you.  Call a phone quitline (1-800-QUIT-NOW), reach out to support groups, or work with a counselor.  Ask people who smoke to not smoke around you.  Avoid places that make you want to smoke,   such as: ? Bars. ? Parties. ? Smoke-break areas at work.  Spend time with people who do not smoke.  Lower the stress in your life. Stress can make you want to smoke. Try these things to help your stress: ? Getting regular exercise. ? Doing deep-breathing exercises. ? Doing yoga. ? Meditating. ? Doing a body scan. To do this, close your eyes, focus on one area of your body at a time from head to toe. Notice which parts of your body are tense. Try to relax the muscles in those areas.   How will I feel when I quit smoking? Day 1 to 3 weeks Within the first 24 hours,  you may start to have some problems that come from quitting tobacco. These problems are very bad 2-3 days after you quit, but they do not often last for more than 2-3 weeks. You may get these symptoms:  Mood swings.  Feeling restless, nervous, angry, or annoyed.  Trouble concentrating.  Dizziness.  Strong desire for high-sugar foods and nicotine.  Weight gain.  Trouble pooping (constipation).  Feeling like you may vomit (nausea).  Coughing or a sore throat.  Changes in how the medicines that you take for other issues work in your body.  Depression.  Trouble sleeping (insomnia). Week 3 and afterward After the first 2-3 weeks of quitting, you may start to notice more positive results, such as:  Better sense of smell and taste.  Less coughing and sore throat.  Slower heart rate.  Lower blood pressure.  Clearer skin.  Better breathing.  Fewer sick days. Quitting smoking can be hard. Do not give up if you fail the first time. Some people need to try a few times before they succeed. Do your best to stick to your quit plan, and talk with your doctor if you have any questions or concerns. Summary  Smoking tobacco is the leading cause of preventable death. Quitting smoking can be hard, but it is one of the best things that you can do for your health.  When you decide to quit smoking, make a plan to help you succeed.  Quit smoking right away, not slowly over a period of time.  When you start quitting, seek help from your doctor, family, or friends. This information is not intended to replace advice given to you by your health care provider. Make sure you discuss any questions you have with your health care provider. Document Revised: 07/07/2019 Document Reviewed: 12/31/2018 Elsevier Patient Education  2021 Elsevier Inc.  

## 2020-12-26 NOTE — Progress Notes (Signed)
Appointment Outcome:  Completed, Session #: Initial  AGREEMENTS SECTION Overall Goal(s): Smoking cessation                                           Agreement/Action Steps:  Aim for 18-20 cigarettes daily No smoking while driving (on short trips) Writing in journal during the day Contact non-smoking friend for support Call 1800QuitNow   Progress Notes:  Patient stated that she is a habitual quitter. Patient has quit and maintained smoking cessation for one month on Wellbutrin. Patient shared that her challenge was being consistent with taking the medication. Patient stated that she noticed a positive change in her mood as well when taking the medicine. Patient has tried Chantix but do not like the side effects. Patient has tried nicotine patches but stated that she was unsuccessful during that time.   Patient has suffered some trauma that aids in her smoking habit. Patient also mentioned that being alone is a trigger for her smoking because of boredom. Patient stated that her smoking is connected to everything that she does: cooking, listening to music, watching tv, getting ready for the day, eating, etc. Patient stated that she copes with stress by smoking. Patient stated that another trigger for her is being around others that smoke. Patient smokes [place] 100s and smoke a whole cigarette at a time.   Patient is concerned about her eating habits as well because of her diabetes and after quitting smoking. This may be a potential goal the patient wants to work towards later.   Coaching Outcomes: Patient has identified her why for quitting: patient wants to improve her overall health, especially diabetes, circulation, and to prevent cancer. The patient's goal is to disrupt her current smoking pattern and stop connecting smoking with everything she does.  Patient is interested in getting involved in activities in the community to give her something to do away from home that is safe (e.g.,  cooking classes). Patient stated that being busy will help her not think about smoking and give her a chance to cut back on how much she smokes.   Patient is interested in taking Wellbutrin and the nicotine patches. Care Guide will inform Dr. Oval Linsey about a prescription for Wellbutrin. Patient will call 1800QuitNow for support and nicotine patches.   Patient is aware that if counseling services are needed that a referral can be made for behavioral health services.   Over the next two weeks, the patient will implement the following action steps as co-created:  Agreement/Action Steps:  Aim for 18-20 cigarettes daily No smoking while driving (on short trips) Writing in journal during the day Contact non-smoking friend for support Call 1800QuitNow   Patient has received a signed copy of the health coaching agreement with the Code of Ethics.

## 2021-01-01 ENCOUNTER — Telehealth: Payer: Self-pay | Admitting: *Deleted

## 2021-01-01 DIAGNOSIS — I10 Essential (primary) hypertension: Secondary | ICD-10-CM

## 2021-01-01 MED ORDER — LISINOPRIL 20 MG PO TABS: 20.0000 mg | ORAL_TABLET | Freq: Every day | ORAL | 3 refills | Status: DC

## 2021-01-01 MED ORDER — BUPROPION HCL ER (SR) 150 MG PO TB12: ORAL_TABLET | ORAL | 1 refills | Status: DC

## 2021-01-01 MED ORDER — LISINOPRIL 10 MG PO TABS: 20.0000 mg | ORAL_TABLET | Freq: Every day | ORAL | 3 refills | Status: DC

## 2021-01-01 NOTE — Addendum Note (Signed)
Addended by: Alvina Filbert B on: 01/01/2021 05:32 PM   Modules accepted: Orders

## 2021-01-01 NOTE — Telephone Encounter (Signed)
-----   Message from Skeet Latch, MD sent at 12/30/2020  6:27 PM EST ----- Regarding: RE: Wellbutrin Hi Amy,  Yes she can have Wellbutrin.  Rip Harbour can you please send Wellbutrin to the pharmacy for her?    Thanks! ----- Message ----- From: Avelino Leeds Sent: 12/26/2020   2:01 PM EST To: Skeet Latch, MD Subject: Wellbutrin                                     Hi Dr. Oval Linsey,  I am working with this patient on smoking cessation. She informed me that she has taken Wellbutrin previously to quit smoking for and she was successful for some time when using it. She is interested in taking that again. Is this medication a good fit for her, and if so, can she get a prescription written?  Thanks, Amy

## 2021-01-01 NOTE — Telephone Encounter (Addendum)
START WELLBUTRIN 150 MG DAILY FOR 3 DAYS AND THEN INCREASE TO 1 TABLET TWICE A DAY FOR A TOTAL OF 12 WEEKS. STOP SMOKING 5-7 DAYS AFTER STARTING [facility]  Advised patient, verbalized understanding.   Per patient she started the Crestor 5 mg daily and after 3 days she started having joint pain. She stopped, pain improved Will forward to Dr Oval Linsey for review

## 2021-01-02 ENCOUNTER — Other Ambulatory Visit: Payer: Self-pay | Admitting: Family Medicine

## 2021-01-02 DIAGNOSIS — I10 Essential (primary) hypertension: Secondary | ICD-10-CM

## 2021-01-02 NOTE — Telephone Encounter (Signed)
Requested medications are due for refill today.  unknown  Requested medications are on the active medications list.  No - Removed on 10/31/2019 by Dr. Margarita Rana  Last refill. 12/21  Future visit scheduled.   Yes  Notes to clinic.  Pe chart med was discontinued on 10/30/2020. Please advise.

## 2021-01-07 NOTE — Telephone Encounter (Signed)
Thank you :)

## 2021-01-09 ENCOUNTER — Ambulatory Visit: Payer: Medicare HMO

## 2021-01-13 ENCOUNTER — Ambulatory Visit: Payer: Medicare HMO

## 2021-01-13 ENCOUNTER — Telehealth: Payer: Self-pay

## 2021-01-13 DIAGNOSIS — Z0000 Encounter for general adult medical examination without abnormal findings: Secondary | ICD-10-CM

## 2021-01-13 NOTE — Telephone Encounter (Signed)
Patient has been called and rescheduled for 01/22/21 at 11:30am.

## 2021-01-13 NOTE — Telephone Encounter (Signed)
Patient states she would like to reschedule her appointment today due to not feeling well.

## 2021-01-13 NOTE — Telephone Encounter (Signed)
Called patient back as requested to reschedule health coaching session for today. Patient has been scheduled for 01/22/21 at 11:30am. Patient will call if anything changes.

## 2021-01-20 ENCOUNTER — Other Ambulatory Visit: Payer: Self-pay

## 2021-01-20 ENCOUNTER — Ambulatory Visit (INDEPENDENT_AMBULATORY_CARE_PROVIDER_SITE_OTHER): Payer: Medicare HMO

## 2021-01-20 ENCOUNTER — Other Ambulatory Visit (HOSPITAL_COMMUNITY)
Admission: RE | Admit: 2021-01-20 | Discharge: 2021-01-20 | Disposition: A | Discharge status: Home / Self Care | Payer: Medicare HMO | Source: Ambulatory Visit | Attending: Family Medicine | Admitting: Family Medicine

## 2021-01-20 VITALS — BP 133/82 | HR 82 | Wt 192.6 lb

## 2021-01-20 DIAGNOSIS — B373 Candidiasis of vulva and vagina: Secondary | ICD-10-CM | POA: Insufficient documentation

## 2021-01-20 DIAGNOSIS — Z113 Encounter for screening for infections with a predominantly sexual mode of transmission: Secondary | ICD-10-CM | POA: Diagnosis not present

## 2021-01-20 DIAGNOSIS — N898 Other specified noninflammatory disorders of vagina: Secondary | ICD-10-CM | POA: Insufficient documentation

## 2021-01-20 DIAGNOSIS — R69 Illness, unspecified: Secondary | ICD-10-CM | POA: Diagnosis not present

## 2021-01-20 NOTE — Progress Notes (Signed)
Here today for normal appearing vaginal discharge with odor and itching. This began over 1 week ago. Reports using boric acid for 3 days with no improvement. Reports itching a few days ago, but none today. Recent sexual encounter, not with new partner. Pt would like vaginal swab to include STI testing. Self swab instructions given and specimen obtained. Pt states she cannot take Flagyl, including vaginal gel form. Will need alternative if positive for BV. Pt also requests prophylactic Diflucan treatment for yeast infection if there is a need for antibiotic.  Positive PHQ-9 and GAD-7; endorses some SI, no thoughts of harm. Pt verbalizes she does not have any plan for harm. Pt states she has not followed up with [facility] due to financial barrier, visits not covered by insurance. Jamie to bedside to offer appt with interns at no cost, pt agreeable.   Apolonio Schneiders RN 01/20/21

## 2021-01-21 ENCOUNTER — Other Ambulatory Visit: Payer: Self-pay | Admitting: Family Medicine

## 2021-01-21 DIAGNOSIS — B373 Candidiasis of vulva and vagina: Secondary | ICD-10-CM

## 2021-01-21 DIAGNOSIS — B3731 Acute candidiasis of vulva and vagina: Secondary | ICD-10-CM

## 2021-01-21 LAB — CERVICOVAGINAL ANCILLARY ONLY
Bacterial Vaginitis (gardnerella): NEGATIVE
Candida Glabrata: NEGATIVE
Candida Vaginitis: POSITIVE — AB
Chlamydia: NEGATIVE
Comment: NEGATIVE
Comment: NEGATIVE
Comment: NEGATIVE
Comment: NEGATIVE
Comment: NEGATIVE
Comment: NORMAL
Neisseria Gonorrhea: NEGATIVE
Trichomonas: NEGATIVE

## 2021-01-21 MED ORDER — DIFLUCAN 150 MG PO TABS
150.0000 mg | ORAL_TABLET | Freq: Once | ORAL | 0 refills | Status: AC
Start: 2021-01-21 — End: 2021-01-21

## 2021-01-21 NOTE — Progress Notes (Signed)
Rx for diflucan sent to patient's pharmacy. No evidence of BV on wet prep.  Arrie Senate, MD OB Fellow, [place] [facility] 01/21/2021 10:33 PM

## 2021-01-22 ENCOUNTER — Ambulatory Visit: Payer: Self-pay

## 2021-01-23 ENCOUNTER — Other Ambulatory Visit: Payer: Self-pay

## 2021-01-23 ENCOUNTER — Ambulatory Visit (INDEPENDENT_AMBULATORY_CARE_PROVIDER_SITE_OTHER): Payer: Medicare HMO

## 2021-01-23 ENCOUNTER — Ambulatory Visit: Payer: Medicare HMO | Admitting: Clinical

## 2021-01-23 ENCOUNTER — Telehealth: Payer: Self-pay | Admitting: Clinical

## 2021-01-23 DIAGNOSIS — Z5329 Procedure and treatment not carried out because of patient's decision for other reasons: Secondary | ICD-10-CM

## 2021-01-23 DIAGNOSIS — Z91199 Patient's noncompliance with other medical treatment and regimen due to unspecified reason: Secondary | ICD-10-CM

## 2021-01-23 DIAGNOSIS — Z0000 Encounter for general adult medical examination without abnormal findings: Secondary | ICD-10-CM

## 2021-01-23 NOTE — BH Specialist Note (Signed)
Pt did not arrive to video visit and Intern Left HIPPA-compliant message to call back Roselyn Reef from [employer] for [employer] at [facility] at [phone] [facility] office).  ; left MyChart message for patient.    Gertha Calkin (Supervisor: Vesta Mixer)

## 2021-01-23 NOTE — Progress Notes (Signed)
Appointment Outcome:  Completed, Session #: 1  AGREEMENTS SECTION   Overall Goal(s): Smoking Cessation  Agreement/Action Steps:   Aim for 18-20 cigarettes daily  No smoking while driving (on short trips)  Writing in journal during the day  Contact non-smoking friend for support  Call 1800QuitNow  Progress Notes:  Patient stated that she wrote in her journal on one occasion, but not since then because writing does not come natural for her. Patient expressed that she would have to put in effort to make it a habit to write. She believes this step is helpful and will try it again. Patient has utilized her family and friends as her support system, and everyone is encouraging/supporting her efforts to quit.  Patient reported that she has been taking Wellbutrin for approximately 1.5 weeks and is currently smoking 2 cigarettes per day. Patient shared that cigarettes taste horrible now and she isn't sure why she is still smoking. Prior, patient was able to maintain the range of cigarettes agreed upon per day. Patient found it challenging not to smoke while driving on short trips. Patient questions herself of who she will be without identifying as a smoker.   Patient stated that cravings come occasionally. To deal with this, she expressed that she encourages herself to complete her errands first, or talk to herself for encouragement not to smoke by saying things such as I'm not a smoker, I don't like the smell in my clothes/home, etc. She also reminds herself of her ultimate reasons for wanting to quit now. Patient also has implemented brushing her teeth to deal with cravings. Patient has been working out at home by following a YouTube video almost every day.   Patient mentioned that she has not purchased a full pack of cigarettes in two weeks. However, patient has purchased single cigarettes. Patient is interested in nicotine patches to help her get through the next phase of smoking  cessation.  Patient stated that she knows her mindset is different from other times that she has quit before. Patient mentioned that she is really paying attention and her self-awareness has increased. Patient stated this time she is holding herself 100% accountable  . Indicators of Success and Accountability:  Patient has taken Wellbutrin as prescribed and was able to cut back to 2 cigarettes per day. . Readiness: Patient is in the action phase of smoking cessation. . Strengths and Supports: Patient's strengths are determination and being focused. Patient is being supported by her family and children. . Challenges and Barriers: Patient does not foresee any challenges over the next two weeks to implementing her steps.   Coaching Outcomes: Patient will call 1800QuitNow to inquire about nicotine patches and inform the Care Guide of the outcome to determine if a prescription is needed instead. Patient is aware that she cannot smoke and wear nicotine patches.  Patient will resume writing in her journal when cravings/stress occurs. Patient was provided two different prompts to determine which would be most beneficial to guide her writing during that time.   Patient is interested in engaging in a hobby/activity to stay busy. Patient is looking within the community for free opportunities to become active in.   It was suggested that patient try to incorporate drinking more water to manage cravings. Patient stated that she would try and see if it helps.  Patient's new agreement/action steps are outlined below.  Agreement/Action Steps:   Calling 1800QuitNow [phone]) for nicotine patches  Drinking water (as much and as often as possible) when  cravings hit  Continuing to follow YouTube video/walking daily  Take Wellbutrin as prescribed  Utilize support system  Brush Teeth during cravings  Self-check-ins: Asking self why I even need this cigarette before lighting it, while reminded self  of your reasons for quitting in the first place  Positive self-talk: Telling self you're not a smoker, why you don't like smoking, that you have overcome smoking, and you that you no longer need a cigarette to relax/calm down  Writing in journal when cravings hit or something stressful happens to avoid smoking   Engage in a hobby/activity to stay busy  Patient was encouraged to celebrate the progress she has already accomplished.   Attempted: Marland Kitchen Fulfilled - Patient was able to meet cigarette goal per day. Patient has connected with family and children to create support system. . Partial - Patient attempted to journal once. Patient attempted to not smoke and drive on short trips.   Not Met - Patient did not contact 1800QuitNow for nicotine patches yet.

## 2021-01-23 NOTE — Telephone Encounter (Signed)
Intern called Pt regarding virtual appt. Intern informed a link had been sent and the Pt is able to join. Intern informed she will wait for 15 minutes and provided the Pt a phone number to reschedule [phone].  Gertha Calkin (Supervisor: Vesta Mixer)

## 2021-01-24 ENCOUNTER — Other Ambulatory Visit: Payer: Self-pay | Admitting: Family Medicine

## 2021-01-24 ENCOUNTER — Other Ambulatory Visit: Payer: Self-pay | Admitting: Pharmacist

## 2021-01-24 MED ORDER — CANDESARTAN CILEXETIL 32 MG PO TABS: 32.0000 mg | ORAL_TABLET | Freq: Every day | ORAL | 0 refills | Status: DC

## 2021-01-28 ENCOUNTER — Other Ambulatory Visit: Payer: Self-pay | Admitting: Family Medicine

## 2021-01-28 DIAGNOSIS — Z8619 Personal history of other infectious and parasitic diseases: Secondary | ICD-10-CM

## 2021-01-28 DIAGNOSIS — I10 Essential (primary) hypertension: Secondary | ICD-10-CM

## 2021-01-28 MED ORDER — EXENATIDE ER 2 MG/0.85ML ~~LOC~~ AUIJ
2.0000 mg | AUTO-INJECTOR | SUBCUTANEOUS | 2 refills | Status: DC
  Filled 2021-02-27: qty 6.8, 56d supply, fill #0
  Filled 2021-04-22 – 2021-04-30 (×2): qty 3.4, 28d supply, fill #1

## 2021-01-28 NOTE — Telephone Encounter (Signed)
Requested medications are due for refill today yes  Requested medications are on the active medication list yes  Last refill 3/4  Last visit 06/2020  Future visit scheduled 02/2021  Notes to clinic Not Delegated.

## 2021-01-28 NOTE — Telephone Encounter (Signed)
Requested medication (s) are due for refill today: -  Requested medication (s) are on the active medication list: historical med  Last refill:  09/24/20  Future visit scheduled: no  Notes to clinic:  historical med- no refill protocol for this order   Requested Prescriptions  Pending Prescriptions Disp Refills   Exenatide ER 2 MG/0.85ML AUIJ 3.4 mL 1    Sig: INJECT 2 MG SUBCUTANEOUSLY ONCE EVERY WEEK      There is no refill protocol information for this order

## 2021-01-28 NOTE — Telephone Encounter (Signed)
Copied from [place] [id number]. Topic: Quick Communication - Rx Refill/Question >> Jan 28, 2021 11:36 AM Tessa Lerner A wrote: Medication: Exenatide ER (BYDUREON) 2 MG PEN   Has the patient contacted their pharmacy? Patient has attempted to contact their pharmacy but been unsuccessful  Preferred Pharmacy (with phone number or street name): [id number] - [place], [place]  Phone:  [phone]  Agent: Please be advised that RX refills may take up to 3 business days. We ask that you follow-up with your pharmacy.

## 2021-02-03 MED ORDER — LISINOPRIL 20 MG PO TABS: 20.0000 mg | ORAL_TABLET | Freq: Every day | ORAL | 3 refills | Status: DC

## 2021-02-03 MED ORDER — AMLODIPINE BESYLATE 10 MG PO TABS: 10.0000 mg | ORAL_TABLET | Freq: Every day | ORAL | 3 refills | Status: DC

## 2021-02-05 ENCOUNTER — Telehealth: Payer: Self-pay

## 2021-02-05 DIAGNOSIS — Z0000 Encounter for general adult medical examination without abnormal findings: Secondary | ICD-10-CM

## 2021-02-05 NOTE — Telephone Encounter (Signed)
Returned patient's call to reschedule appointment. Patient has been rescheduled for 02/13/21 at 10:30am. Care Guide will call patient at this time to hold health coaching session over the phone.

## 2021-02-05 NOTE — Telephone Encounter (Signed)
Patient called to reschedule her appointment with Avelino Leeds.

## 2021-02-05 NOTE — Telephone Encounter (Signed)
I have called the patient and reschedule her health coaching session as requested for 02/13/21 at 10:30am over the phone.

## 2021-02-06 ENCOUNTER — Ambulatory Visit: Payer: Medicare HMO

## 2021-02-10 ENCOUNTER — Ambulatory Visit: Payer: Medicare HMO

## 2021-02-13 ENCOUNTER — Ambulatory Visit (INDEPENDENT_AMBULATORY_CARE_PROVIDER_SITE_OTHER): Payer: Self-pay

## 2021-02-13 ENCOUNTER — Other Ambulatory Visit: Payer: Self-pay

## 2021-02-13 DIAGNOSIS — Z0000 Encounter for general adult medical examination without abnormal findings: Secondary | ICD-10-CM

## 2021-02-13 NOTE — Progress Notes (Signed)
Appointment Outcome:  Completed, Session #: 2  AGREEMENTS SECTION  Overall Goal(s): Smoking cessation                                             Agreement/Action Steps:   Calling 1800[facility]uitNow [phone]) for nicotine patches  Drinking water (as much and as often as possible) when cravings hit  Continuing to follow YouTube video/walking daily  Take Wellbutrin as prescribed  Utilize support system  Brush Teeth during cravings  Self-check-ins: Asking self why I even need this cigarette before lighting it, while reminded self of your reasons for quitting in the first place  Positive self-talk: Telling self you're not a smoker, why you don't like smoking, that you have overcome smoking, and you that you no longer need a cigarette to relax/calm down  Writing in journal when cravings hit or something stressful happens to avoid smoking   Engage in a hobby/activity to stay busy   Progress Notes:  Patient stated that she has called 1800QuitNow and she is in the process of receiving nicotine patches and hope they help with cravings. Patient stated that she did smoke over the weekend when she went out of town. Patient stated that she was triggered to smoke because others where smoking and she is a social smoker. Patient did not buy a pack of cigarettes but got them from someone else. Patient stated that the individuals that she was with are supportive of her quitting and would not allow her to smoke and entire cigarette or short. Patient believes that her next vacation that she will struggle with smoking as well. Patient stated that cutting back is better than nothing if she were to slip up again.  Patient reported that she has not smoked since Tuesday. Patient stated that she does not smoke while at work and must fight the urge. Patient mentioned that she has been using pep talks/positive self-talk to work through cravings to remain smoke free. Patient feels that this technique was the  most beneficial. Patient stated that these pep talks help with giving herself alternative options to smoking and create changes in her environment. Patient stated that she occasionally used self-check-ins.   Patient did receive the email with the [facility] sponsored activities ad believe that such activities would be beneficial but haven't engaged with them. Patient stated that she keeps herself busy during the day. She stated that every hour that goes by the less she wants a cigarette. Either something comes up to distract her or she does something and don't think about smoking.   Patient shared that her daughter and roommate are supportive of her quitting. Patient stated that they go outside to smoke. Patient stated that they try to work with her to deter her from getting cigarettes from them if she asks. Patient has been drinking water and stated that it does help with the cravings.   Patient stated that she has not written in a journal, because she doesn't have the time and it's one more thing to do. Instead, the patient continues to use the pep talks/positive self-talk every day to remind herself that she is not a smoker. Patient is taking Wellbutrin as prescribed and stated that it is helping with her mood but feel as though it hasn't really worked for smoking after the first two weeks. Patient stated that she will continue to take it as  prescribed regardless because it makes it easier to deal with things that may contribute to smoking.   Patient has been exercising 5 days/week. The time varies depending on the amount of time she has, the video she is following, and the mood she is in. Patient stated that her exercise session last anywhere from 15 mins to one hour. Patient states that this helps with not smoking as well. Patient is interested in incorporating stretching one a week at some point.  . Indicators of Success and Accountability:  What are the mile markers along your path to reaching your desired  changes  . Readiness: How ready are you to make the changes you have identified?  Rate your readiness on a 1-5 scale with 5 being the readiest.  . Strengths and Supports: Who will support you? What can you rely on? Personal strengths that you will tap into? . Challenges and Barriers: What could block you or prevent you meeting your goal?  Coaching Outcomes: Patient shared that she will be attending a graduation in two weeks and after this session has decided that she will use pep talking/positive self-talk to remind herself that she is not a smoker.   On her next vacation, the patient will think about fun activities that she can do with her girlfriends that does not include smoking or remove herself from around them when they smoke.   Patient identified her why for not smoking. Patient stated that she does not want to get to the point where she cannot breathe, she wants her skin to get better, and she wants to improve her health for her age.   Patient will focus on the following action steps over the next two weeks:  Agreement/Action Steps:   Not smoking any cigarettes  Not buying a pack of cigarettes or asking for one  Drinking water (as much and as often as possible) when cravings hit  Continuing to follow YouTube video/walking 5 days/week  Take Wellbutrin as prescribed  Utilize support system

## 2021-02-18 ENCOUNTER — Ambulatory Visit: Payer: Medicare HMO

## 2021-02-18 NOTE — Progress Notes (Deleted)
Patient ID: Amber Rose                 DOB: 10-03-1976                      MRN: [id number]     HPI: Amber Rose is a 45 y.o. female referred by Dr. Oval Linsey to HTN clinic. PMH includes hypertension, diabetes, tobacco use, aorta atherosclerosis, fibromyalgia, depression, and anxiety. Diagnosed with HTN 30 years ago. Uses ibuprofen every 8 hours for up to a week. Working with Avelino Leeds on health coaching for smoking cessation.  Current HTN meds:  Amlodipine 19m daily Lisinopril 218mdaily  Previously tried:   BP goal: 130/80  Family History:   Social History: denies caffeine or alcohol use. Smoker?  Diet: 50/50 eats out and home cook meals? But struggle with salt intake  Exercise: She exercise daily with Youtube videos for 30 minutes daily.  Home BP readings:   Wt Readings from Last 3 Encounters:  01/20/21 192 lb 9.6 oz (87.4 kg)  12/12/20 188 lb (85.3 kg)  11/25/20 197 lb 9.6 oz (89.6 kg)   BP Readings from Last 3 Encounters:  01/20/21 133/82  12/12/20 124/80  11/25/20 (!) 154/82   Pulse Readings from Last 3 Encounters:  01/20/21 82  12/12/20 74  11/25/20 76    Renal function: CrCl cannot be calculated (Patient's most recent lab result is older than the maximum 21 days allowed.).  Past Medical History:  Diagnosis Date  . Allergy   . Anxiety   . Arthritis   . Chronic UTI   . Depression   . [place] mellitus without [place]  . Diverticulosis   . Fibromyalgia   . GERD (gastroesophageal reflux disease)   . Hiatal hernia   . Hyperlipidemia   . Hypertension   . Internal hemorrhoids   . Meningitis, viral   . Pancreatitis   . Pure hypercholesterolemia 12/23/2020    Current Outpatient Medications on File Prior to Visit  Medication Sig Dispense Refill  . acyclovir (ZOVIRAX) 400 MG tablet TAKE 1 TABLET BY MOUTH TWICE A DAY 180 tablet 0  . albuterol (PROAIR HFA) 108 (90 Base) MCG/ACT inhaler Inhale 2 puffs into the lungs every 4 (four)  hours as needed for wheezing or shortness of breath. 3 Inhaler 0  . amLODipine (NORVASC) 10 MG tablet Take 1 tablet (10 mg total) by mouth daily. 90 tablet 3  . Blood Glucose Monitoring Suppl (ACCU-CHEK AVIVA PLUS) w/Device KIT USE AS DIRECTED DAILY. E11.9 (Patient not taking: Reported on 01/20/2021) 1 kit 0  . buPROPion (WELLBUTRIN SR) 150 MG 12 hr tablet 1 tablet daily for 3 days and then twice a day for 12 weeks 30 tablet 1  . ergocalciferol (DRISDOL) 1.25 MG (50000 UT) capsule Take 1 capsule (50,000 Units total) by mouth once a week. 12 capsule 0  . Exenatide ER 2 MG/0.85ML AUIJ Inject 2 mg into the skin once a week. 3.4 mL 2  . gabapentin (NEURONTIN) 300 MG capsule Take 1 capsule (300 mg total) by mouth 3 (three) times daily. Discontinue once Lyrica is obtained. 90 capsule 3  . hydrOXYzine (ATARAX/VISTARIL) 25 MG tablet Take 1 tablet (25 mg total) by mouth 3 (three) times daily as needed. 270 tablet 1  . ibuprofen (ADVIL) 600 MG tablet TAKE 1 TABLET BY MOUTH EVERY 8 HOURS AS NEEDED. 60 tablet 1  . Lancet Devices (ACCU-CHEK SOFTCLIX) lancets Use as instructed daily. 1 each 5  .  lisinopril (ZESTRIL) 20 MG tablet Take 1 tablet (20 mg total) by mouth daily. 90 tablet 3  . methocarbamol (ROBAXIN) 750 MG tablet Take 1 tablet (750 mg total) by mouth every 8 (eight) hours as needed for muscle spasms. 180 tablet 1  . Multiple Vitamin (MULTI-VITAMIN DAILY) TABS Take 1 tablet by mouth daily as needed. (Patient not taking: Reported on 01/20/2021)    . omeprazole (PRILOSEC) 40 MG capsule Take 1 tab every morning. 90 capsule 3   No current facility-administered medications on file prior to visit.    Allergies  Allergen Reactions  . Atorvastatin Other (See Comments)     Joint pain  . Crestor [Rosuvastatin]     Joint pain   . Metformin And Related Nausea And Vomiting  . Metronidazole Nausea And Vomiting  . Penicillins     childhood  . Xanax [Alprazolam]     "Caused upper respiratory symptoms" per pt   . Glyburide Other (See Comments)    "blood sugar dropped uncontrollably"  . Linagliptin Diarrhea and Other (See Comments)    Stomach pain, sinus infection  . Moxifloxacin Diarrhea    There were no vitals taken for this visit.  No problem-specific Assessment & Plan notes found for this encounter.    Avanish Cerullo Rodriguez-Guzman PharmD, BCPS, [place] [address] 02/18/2021 9:06 AM

## 2021-02-24 ENCOUNTER — Ambulatory Visit: Payer: Medicare HMO | Admitting: Family Medicine

## 2021-02-25 ENCOUNTER — Other Ambulatory Visit: Payer: Self-pay

## 2021-02-27 ENCOUNTER — Other Ambulatory Visit: Payer: Self-pay

## 2021-02-27 ENCOUNTER — Ambulatory Visit: Payer: Medicare HMO

## 2021-02-27 ENCOUNTER — Telehealth: Payer: Self-pay

## 2021-02-27 DIAGNOSIS — Z0000 Encounter for general adult medical examination without abnormal findings: Secondary | ICD-10-CM

## 2021-02-27 NOTE — Telephone Encounter (Signed)
Patient was called because she arrived at 10:21am for health coaching session at 10:30am. When patient was called from the lobby, patient was not present. Left patient a message to return call to Care Guide to hold session later today or to reschedule.

## 2021-02-27 NOTE — Telephone Encounter (Signed)
LVMT pt requesting call back to discuss PREP classes starting in May.

## 2021-03-10 DIAGNOSIS — M545 Low back pain, unspecified: Secondary | ICD-10-CM | POA: Diagnosis not present

## 2021-03-10 DIAGNOSIS — M160 Bilateral primary osteoarthritis of hip: Secondary | ICD-10-CM | POA: Diagnosis not present

## 2021-03-12 ENCOUNTER — Telehealth: Payer: Self-pay

## 2021-03-12 DIAGNOSIS — Z0000 Encounter for general adult medical examination without abnormal findings: Secondary | ICD-10-CM

## 2021-03-12 NOTE — Telephone Encounter (Signed)
Called patient to follow up on rescheduling health coaching session. Left patient a message to return call to Care Guide at [phone].

## 2021-03-13 ENCOUNTER — Ambulatory Visit: Payer: Medicare HMO

## 2021-03-16 ENCOUNTER — Other Ambulatory Visit: Payer: Self-pay | Admitting: Family Medicine

## 2021-03-16 DIAGNOSIS — U071 COVID-19: Secondary | ICD-10-CM | POA: Diagnosis not present

## 2021-03-16 DIAGNOSIS — N760 Acute vaginitis: Secondary | ICD-10-CM | POA: Diagnosis not present

## 2021-03-16 DIAGNOSIS — Z8619 Personal history of other infectious and parasitic diseases: Secondary | ICD-10-CM

## 2021-03-16 NOTE — Telephone Encounter (Signed)
Requested Prescriptions  Pending Prescriptions Disp Refills  . Vitamin D, Ergocalciferol, (DRISDOL) 1.25 MG (50000 UNIT) CAPS capsule [Pharmacy Med Name: VITAMIN D2 1.25MG (50,000 UNIT)] 12 capsule     Sig: TAKE 1 CAPSULE BY MOUTH ONE TIME PER WEEK     Endocrinology:  Vitamins - Vitamin D Supplementation Failed - 03/16/2021 11:56 AM      Failed - 50,000 IU strengths are not delegated      Failed - Phosphate in normal range and within 360 days    No results found for: PHOS       Failed - Vitamin D in normal range and within 360 days    Vit D, 25-Hydroxy  Date Value Ref Range Status  07/02/2020 14.9 (L) 30.0 - 100.0 ng/mL Final    Comment:    Vitamin D deficiency has been defined by the Institute of Medicine and an Endocrine Society practice guideline as a level of serum 25-OH vitamin D less than 20 ng/mL (1,2). The Endocrine Society went on to further define vitamin D insufficiency as a level between 21 and 29 ng/mL (2). 1. IOM (Institute of Medicine). 2010. Dietary reference    intakes for calcium and D. [place]: The    [employer]. 2. Holick MF, Binkley [place], Bischoff-Ferrari HA, et al.    Evaluation, treatment, and prevention of vitamin D    deficiency: an Endocrine Society clinical practice    guideline. JCEM. 2011 Jul; 96(7):1911-30.          Passed - Ca in normal range and within 360 days    Calcium  Date Value Ref Range Status  12/12/2020 9.4 8.7 - 10.2 mg/dL Final         Passed - Valid encounter within last 12 months    Recent Outpatient Visits          3 months ago Type 2 diabetes mellitus with other neurologic complication, without long-term current use of insulin ([place])   [place], Charlane Ferretti, MD   4 months ago [place], Charlane Ferretti, MD   8 months ago Type 2 diabetes mellitus with other neurologic complication, without long-term current use of insulin ([place])    [place], Enobong, MD   11 months ago [place], Enobong, MD   1 year ago Abnormal findings on diagnostic imaging of other parts of digestive tract   [place], Enobong, MD      Future Appointments            In 2 weeks Charlott Rakes, MD [place]   In 3 weeks  [place], [place]           . acyclovir (ZOVIRAX) 400 MG tablet [Pharmacy Med Name: ACYCLOVIR 400 MG TABLET] 180 tablet 0    Sig: TAKE 1 TABLET BY MOUTH TWICE A DAY     Antimicrobials:  Antiviral Agents - Anti-Herpetic Passed - 03/16/2021 11:56 AM      Passed - Valid encounter within last 12 months    Recent Outpatient Visits          3 months ago Type 2 diabetes mellitus with other neurologic complication, without long-term current use of insulin ([place])   [place], Enobong, MD   4 months ago [place], [place], MD   8 months ago Type 2 diabetes mellitus with other neurologic complication, without long-term current use of insulin ([place])   [place], Enobong, MD   11 months ago [place], Enobong, MD   1 year ago Abnormal findings on diagnostic imaging of other parts of digestive tract   [place], Enobong, MD      Future Appointments            In 2 weeks Charlott Rakes, MD [place]   In 3 weeks  [place], [place]

## 2021-03-16 NOTE — Telephone Encounter (Signed)
Requested medication (s) are due for refill today: yes  Requested medication (s) are on the active medication list: yes  Last refill:  09/13/20  Future visit scheduled: yes  Notes to clinic:  med not delegated to NT to RF   Requested Prescriptions  Pending Prescriptions Disp Refills   Vitamin D, Ergocalciferol, (DRISDOL) 1.25 MG (50000 UNIT) CAPS capsule [Pharmacy Med Name: VITAMIN D2 1.25MG (50,000 UNIT)] 12 capsule     Sig: TAKE 1 CAPSULE BY MOUTH ONE TIME PER WEEK      Endocrinology:  Vitamins - Vitamin D Supplementation Failed - 03/16/2021 11:56 AM      Failed - 50,000 IU strengths are not delegated      Failed - Phosphate in normal range and within 360 days    No results found for: PHOS        Failed - Vitamin D in normal range and within 360 days    Vit D, 25-Hydroxy  Date Value Ref Range Status  07/02/2020 14.9 (L) 30.0 - 100.0 ng/mL Final    Comment:    Vitamin D deficiency has been defined by the Institute of Medicine and an Endocrine Society practice guideline as a level of serum 25-OH vitamin D less than 20 ng/mL (1,2). The Endocrine Society went on to further define vitamin D insufficiency as a level between 21 and 29 ng/mL (2). 1. IOM (Institute of Medicine). 2010. Dietary reference    intakes for calcium and D. [place]: The    [employer]. 2. Holick MF, Binkley [place], Bischoff-Ferrari HA, et al.    Evaluation, treatment, and prevention of vitamin D    deficiency: an Endocrine Society clinical practice    guideline. JCEM. 2011 Jul; 96(7):1911-30.           Passed - Ca in normal range and within 360 days    Calcium  Date Value Ref Range Status  12/12/2020 9.4 8.7 - 10.2 mg/dL Final          Passed - Valid encounter within last 12 months    Recent Outpatient Visits           3 months ago Type 2 diabetes mellitus with other neurologic complication, without long-term current use of insulin ([place])   [place], Charlane Ferretti, MD   4 months ago [place], [place], MD   8 months ago Type 2 diabetes mellitus with other neurologic complication, without long-term current use of insulin ([place])   [place], Enobong, MD   11 months ago [place], Enobong, MD   1 year ago Abnormal findings on diagnostic imaging of other parts of digestive tract   [place], Enobong, MD       Future Appointments             In 2 weeks Charlott Rakes, MD [place]   In 3 weeks  [facility] [place], [place]              Signed Prescriptions Disp Refills   acyclovir (ZOVIRAX) 400 MG tablet 180 tablet 0    Sig: TAKE 1 TABLET BY MOUTH TWICE A DAY      Antimicrobials:  Antiviral Agents - Anti-Herpetic Passed - 03/16/2021 11:56 AM      Passed - Valid encounter within last 12 months  Recent Outpatient Visits           3 months ago Type 2 diabetes mellitus with other neurologic complication, without long-term current use of insulin ([place])   [place], Charlane Ferretti, MD   4 months ago [place], Charlane Ferretti, MD   8 months ago Type 2 diabetes mellitus with other neurologic complication, without long-term current use of insulin [facility])   [place], Enobong, MD   11 months ago [place], Enobong, MD   1 year ago Abnormal findings on diagnostic imaging of other parts of digestive tract   [place], Enobong, MD       Future Appointments             In 2 weeks Charlott Rakes, MD [place]   In 3 weeks  [place], [place]

## 2021-03-31 ENCOUNTER — Ambulatory Visit: Payer: Medicare HMO | Admitting: Family Medicine

## 2021-04-01 ENCOUNTER — Other Ambulatory Visit: Payer: Self-pay

## 2021-04-02 ENCOUNTER — Other Ambulatory Visit: Payer: Self-pay

## 2021-04-02 ENCOUNTER — Ambulatory Visit: Payer: Medicare HMO | Attending: Family Medicine | Admitting: Family Medicine

## 2021-04-02 VITALS — BP 120/78 | Wt 194.2 lb

## 2021-04-02 DIAGNOSIS — E1169 Type 2 diabetes mellitus with other specified complication: Secondary | ICD-10-CM

## 2021-04-02 DIAGNOSIS — Z6833 Body mass index (BMI) 33.0-33.9, adult: Secondary | ICD-10-CM | POA: Diagnosis not present

## 2021-04-02 DIAGNOSIS — F339 Major depressive disorder, recurrent, unspecified: Secondary | ICD-10-CM

## 2021-04-02 DIAGNOSIS — E1159 Type 2 diabetes mellitus with other circulatory complications: Secondary | ICD-10-CM

## 2021-04-02 DIAGNOSIS — I152 Hypertension secondary to endocrine disorders: Secondary | ICD-10-CM | POA: Diagnosis not present

## 2021-04-02 DIAGNOSIS — F17210 Nicotine dependence, cigarettes, uncomplicated: Secondary | ICD-10-CM | POA: Diagnosis not present

## 2021-04-02 DIAGNOSIS — M791 Myalgia, unspecified site: Secondary | ICD-10-CM | POA: Diagnosis not present

## 2021-04-02 DIAGNOSIS — F1290 Cannabis use, unspecified, uncomplicated: Secondary | ICD-10-CM

## 2021-04-02 DIAGNOSIS — E785 Hyperlipidemia, unspecified: Secondary | ICD-10-CM | POA: Diagnosis not present

## 2021-04-02 DIAGNOSIS — Z0000 Encounter for general adult medical examination without abnormal findings: Secondary | ICD-10-CM

## 2021-04-02 DIAGNOSIS — F17200 Nicotine dependence, unspecified, uncomplicated: Secondary | ICD-10-CM

## 2021-04-02 DIAGNOSIS — R69 Illness, unspecified: Secondary | ICD-10-CM | POA: Diagnosis not present

## 2021-04-02 NOTE — Patient Instructions (Signed)
  Amber Rose , Thank you for taking time to come for your Medicare Wellness Visit. I appreciate your ongoing commitment to your health goals. Please review the following plan we discussed and let me know if I can assist you in the future.   These are the goals we discussed: Goals    . HEMOGLOBIN A1C < 7.0    . Quit Smoking     Patient is engaged in health coaching for smoking cessation. Patient is currently working on the following steps:   Agreement/Action Steps:  Smoking 0-2 cigarettes per day Writing in journal daily Non-smoking support group Call [phone] for nicotine patches       This is a list of the screening recommended for you and due dates:  Health Maintenance  Topic Date Due  . COVID-19 Vaccine (1) Never done  . Pneumococcal Vaccination (1 of 2 - PPSV23) Never done  . Complete foot exam   07/23/2020  . Eye exam for diabetics  11/15/2020  . Hemoglobin A1C  05/25/2021  . Flu Shot  05/26/2021  . Pap Smear  09/14/2023  . Tetanus Vaccine  07/08/2024  . Zoster (Shingles) Vaccine (1 of 2) 11/10/2025  . Colon Cancer Screening  08/16/2026  . Pneumococcal vaccine  Completed  . Hepatitis C Screening: USPSTF Recommendation to screen - Ages 6-79 yo.  Completed  . HIV Screening  Completed  . HPV Vaccine  Aged Out

## 2021-04-03 ENCOUNTER — Encounter: Payer: Self-pay | Admitting: Family Medicine

## 2021-04-07 ENCOUNTER — Telehealth: Payer: Self-pay

## 2021-04-07 NOTE — Telephone Encounter (Signed)
Please call patient and give her an appointment with any available provider or mobile unit.

## 2021-04-08 ENCOUNTER — Ambulatory Visit: Payer: Medicare HMO

## 2021-04-08 NOTE — Progress Notes (Deleted)
04/08/2021 Lance Galas 1975/11/22 [id number]   HPI:  Amber Rose is a 45 y.o. female patient of Dr. Oval Linsey, with a PMH below who presents today for hypertension clinic follow up.  She was seen by Dr. Oval Linsey back in February, at which time her pressure was at goal, 124/80 on lisinopril and hctz.  Patient noted she was first diagnosed with hypertension when she was in her 83's.  Since her first visit we have made several changes, as she appears not to tolerate multiple medications (see list below)  Past Medical History: diabetes   hyperlipidemia Intolerant to multiple statins  depression   GERD   Chronic UTI      Blood Pressure Goal:  130/80  Current Medications:   Family Hx:  Social Hx:   Diet:   Exercise:   Home BP readings:   Intolerances: losartan - sinus infection, hctz - cramping, lisinopril - cough(?) in doses > 5 mg,   Did not try candesartan because of concerns for losartan  Labs:    Wt Readings from Last 3 Encounters:  04/02/21 194 lb 3.2 oz (88.1 kg)  01/20/21 192 lb 9.6 oz (87.4 kg)  12/12/20 188 lb (85.3 kg)   BP Readings from Last 3 Encounters:  04/02/21 120/78  01/20/21 133/82  12/12/20 124/80   Pulse Readings from Last 3 Encounters:  01/20/21 82  12/12/20 74  11/25/20 76    Current Outpatient Medications  Medication Sig Dispense Refill   acyclovir (ZOVIRAX) 400 MG tablet TAKE 1 TABLET BY MOUTH TWICE A DAY 180 tablet 0   albuterol (PROAIR HFA) 108 (90 Base) MCG/ACT inhaler Inhale 2 puffs into the lungs every 4 (four) hours as needed for wheezing or shortness of breath. 3 Inhaler 0   amLODipine (NORVASC) 10 MG tablet Take 1 tablet (10 mg total) by mouth daily. 90 tablet 3   Blood Glucose Monitoring Suppl (ACCU-CHEK AVIVA PLUS) w/Device KIT USE AS DIRECTED DAILY. E11.9 (Patient not taking: Reported on 01/20/2021) 1 kit 0   buPROPion (WELLBUTRIN SR) 150 MG 12 hr tablet 1 tablet daily for 3 days and then twice a day for 12 weeks  30 tablet 1   Exenatide ER 2 MG/0.85ML AUIJ Inject 2 mg into the skin once a week. 3.4 mL 2   gabapentin (NEURONTIN) 300 MG capsule Take 1 capsule (300 mg total) by mouth 3 (three) times daily. Discontinue once Lyrica is obtained. 90 capsule 3   hydrOXYzine (ATARAX/VISTARIL) 25 MG tablet Take 1 tablet (25 mg total) by mouth 3 (three) times daily as needed. 270 tablet 1   ibuprofen (ADVIL) 600 MG tablet TAKE 1 TABLET BY MOUTH EVERY 8 HOURS AS NEEDED. 60 tablet 1   Lancet Devices (ACCU-CHEK SOFTCLIX) lancets Use as instructed daily. 1 each 5   lisinopril (ZESTRIL) 20 MG tablet Take 1 tablet (20 mg total) by mouth daily. 90 tablet 3   methocarbamol (ROBAXIN) 750 MG tablet Take 1 tablet (750 mg total) by mouth every 8 (eight) hours as needed for muscle spasms. 180 tablet 1   Multiple Vitamin (MULTI-VITAMIN DAILY) TABS Take 1 tablet by mouth daily as needed. (Patient not taking: Reported on 01/20/2021)     omeprazole (PRILOSEC) 40 MG capsule Take 1 tab every morning. 90 capsule 3   Vitamin D, Ergocalciferol, (DRISDOL) 1.25 MG (50000 UNIT) CAPS capsule TAKE 1 CAPSULE BY MOUTH ONE TIME PER WEEK 4 capsule 0   No current facility-administered medications for this visit.    Allergies  Allergen Reactions   Atorvastatin Other (See Comments)     Joint pain   Crestor [Rosuvastatin]     Joint pain    Metformin And Related Nausea And Vomiting   Metronidazole Nausea And Vomiting   Penicillins     childhood   Xanax [Alprazolam]     "Caused upper respiratory symptoms" per pt   Glyburide Other (See Comments)    "blood sugar dropped uncontrollably"   Linagliptin Diarrhea and Other (See Comments)    Stomach pain, sinus infection   Moxifloxacin Diarrhea    Past Medical History:  Diagnosis Date   Allergy    Anxiety    Arthritis    Chronic UTI    Depression    Diabetes mellitus without complication (HCC)    Diverticulosis    Fibromyalgia    GERD (gastroesophageal reflux disease)    Hiatal hernia     Hyperlipidemia    Hypertension    Internal hemorrhoids    Meningitis, viral    Pancreatitis    Pure hypercholesterolemia 12/23/2020    There were no vitals taken for this visit.  No problem-specific Assessment & Plan notes found for this encounter.   Tommy Medal PharmD CPP [facility] [address] [place], [place] [address] [phone]

## 2021-04-08 NOTE — Telephone Encounter (Signed)
Patient scheduled.

## 2021-04-09 DIAGNOSIS — F339 Major depressive disorder, recurrent, unspecified: Secondary | ICD-10-CM | POA: Insufficient documentation

## 2021-04-09 DIAGNOSIS — Z6833 Body mass index (BMI) 33.0-33.9, adult: Secondary | ICD-10-CM | POA: Insufficient documentation

## 2021-04-09 NOTE — Progress Notes (Signed)
Subjective:   Amber Rose is a 45 y.o. female who presents for Medicare Annual (Subsequent) preventive wellness exam.   Active Problem List: Amber Rose  has Hypertension; Hidradenitis; Anxiety,tobacco abuse; Fibromyalgia; ADD (attention deficit disorder); Gastroesophageal reflux disease; Type II diabetes mellitus, uncontrolled (Amber Rose); Lupus anticoagulant positive; Benign essential hypertension; Pure hypercholesterolemia; Episode of recurrent major depressive disorder ([place]); and BMI 33.0-33.9,adult on their problem list.    Review of Systems    Pertinent negatives listed in HPI        Objective:    Today's Vitals   04/02/21 1030  BP: 120/78  Weight: 194 lb 3.2 oz (88.1 kg)   Body mass index is 33.86 kg/m.  Advanced Directives 04/26/2020 07/12/2017 05/14/2017 04/21/2017 03/10/2017 02/02/2017 12/25/2016  Does Patient Have a Medical Advance Directive? _0  No No  Would patient like information on creating a medical advance directive? - - - - - No - Patient declined -    Current Medications (verified) Outpatient Encounter Medications as of 04/02/2021  Medication Sig   acyclovir (ZOVIRAX) 400 MG tablet TAKE 1 TABLET BY MOUTH TWICE A DAY   albuterol (PROAIR HFA) 108 (90 Base) MCG/ACT inhaler Inhale 2 puffs into the lungs every 4 (four) hours as needed for wheezing or shortness of breath.   amLODipine (NORVASC) 10 MG tablet Take 1 tablet (10 mg total) by mouth daily.   Blood Glucose Monitoring Suppl (ACCU-CHEK AVIVA PLUS) w/Device KIT USE AS DIRECTED DAILY. E11.9 (Patient not taking: Reported on 01/20/2021)   buPROPion (WELLBUTRIN SR) 150 MG 12 hr tablet 1 tablet daily for 3 days and then twice a day for 12 weeks   Exenatide ER 2 MG/0.85ML AUIJ Inject 2 mg into the skin once a week.   gabapentin (NEURONTIN) 300 MG capsule Take 1 capsule (300 mg total) by mouth 3 (three) times daily. Discontinue once Lyrica is obtained.   hydrOXYzine (ATARAX/VISTARIL) 25 MG tablet Take 1  tablet (25 mg total) by mouth 3 (three) times daily as needed.   ibuprofen (ADVIL) 600 MG tablet TAKE 1 TABLET BY MOUTH EVERY 8 HOURS AS NEEDED.   Lancet Devices (ACCU-CHEK SOFTCLIX) lancets Use as instructed daily.   lisinopril (ZESTRIL) 20 MG tablet Take 1 tablet (20 mg total) by mouth daily.   methocarbamol (ROBAXIN) 750 MG tablet Take 1 tablet (750 mg total) by mouth every 8 (eight) hours as needed for muscle spasms.   Multiple Vitamin (MULTI-VITAMIN DAILY) TABS Take 1 tablet by mouth daily as needed. (Patient not taking: Reported on 01/20/2021)   omeprazole (PRILOSEC) 40 MG capsule Take 1 tab every morning.   Vitamin D, Ergocalciferol, (DRISDOL) 1.25 MG (50000 UNIT) CAPS capsule TAKE 1 CAPSULE BY MOUTH ONE TIME PER WEEK   No facility-administered encounter medications on file as of 04/02/2021.    Allergies (verified) Atorvastatin, Crestor [rosuvastatin], Metformin and related, Metronidazole, Penicillins, Xanax [alprazolam], Glyburide, Linagliptin, and Moxifloxacin   History: Past Medical History:  Diagnosis Date   Allergy    Anxiety    Arthritis    Chronic UTI    Depression    Diabetes mellitus without complication ([place])    Diverticulosis    Fibromyalgia    GERD (gastroesophageal reflux disease)    Hiatal hernia    Hyperlipidemia    Hypertension    Internal hemorrhoids    Meningitis, viral    Pancreatitis    Pure hypercholesterolemia 12/23/2020   Past Surgical History:  Procedure Laterality Date   APPENDECTOMY  1990  ceasarian  2002   CESAREAN SECTION     x1   CHOLECYSTECTOMY  2011   COLONOSCOPY     UPPER GASTROINTESTINAL ENDOSCOPY     URINARY SURGERY     urethra sling, removal, and then revision 2016 (4 surgeries)   WISDOM TOOTH EXTRACTION     Family History  Problem Relation Age of Onset   Cancer Mother        cervical, breast, lungm skin, bladder-ex smoker   Hypertension Mother    Diabetes Father    Multiple sclerosis Paternal Grandmother    Heart attack  Maternal Grandfather    Allergic rhinitis Neg Hx    Angioedema Neg Hx    Asthma Neg Hx    Eczema Neg Hx    Urticaria Neg Hx    Colon cancer Neg Hx    Esophageal cancer Neg Hx    Liver disease Neg Hx    Pancreatic cancer Neg Hx    Prostate cancer Neg Hx    Rectal cancer Neg Hx    Stomach cancer Neg Hx    Social History   Socioeconomic History   Marital status: Single    Spouse name: Not on file   Number of children: 3   Years of education: Not on file   Highest education level: Not on file  Occupational History   Not on file  Tobacco Use   Smoking status: Light Smoker    Packs/day: 0.10    Years: 20.00    Pack years: 2.00    Types: Cigarettes   Smokeless tobacco: Never   Tobacco comments:    Patient is engaged in health coaching for smoking cessation as of 12/26/20  Vaping Use   Vaping Use: Never used  Substance and Sexual Activity   Alcohol use: Yes    Comment: rarely   Drug use: Yes    Types: Marijuana    Comment: last smoker 11-14-17   Sexual activity: Never    Comment: pt on her mentral cycle now began 11-14-17  Other Topics Concern   Not on file  Social History Narrative   Lives home with adult niece.  Not working.  Disability pending.  Pt is single.  Education GED.  3 children.    Social Determinants of Health   Financial Resource Strain: Not on file  Food Insecurity: Food Insecurity Present   Worried About Charity fundraiser in the Last Year: Often true   Arboriculturist in the Last Year: Sometimes true  Transportation Needs: No Transportation Needs   Lack of Transportation (Medical): No   Lack of Transportation (Non-Medical): No  Physical Activity: Not on file  Stress: Not on file  Social Connections: Not on file    Tobacco Counseling Ready to quit: Not Answered Counseling given: Not Answered Tobacco comments: Patient is engaged in health coaching for smoking cessation as of 12/26/20   Diabetic, yes: A1C 6.8, six months prior.    Activities of  Daily Living No flowsheet data found.  Patient Care Team: Amber Rakes, MD as PCP - General (Family Medicine) Amber Leeds  Rose   Assessment:   This is a routine wellness examination for Amber Rose.   Dietary issues and exercise activities discussed:   Encouraged efforts to reduce weight include engaging in physical activity as tolerated with goal of 150 minutes per week. Improve dietary choices and eat a meal regimen consistent with a Mediterranean or DASH diet. Reduce simple carbohydrates. Do not skip meals and eat  healthy snacks throughout the day to avoid over-eating at dinner. Set a goal weight loss that is achievable for you.    Goals Addressed   Weight loss, goal, BMI <30 Consider Smoking Cessation Remission Depression     Depression Screen PHQ 2/9 Scores 04/09/2021 01/20/2021 09/13/2020 07/02/2020 11/08/2019 07/24/2019 06/01/2019  PHQ - 2 Score _0 PHQ- 9 Score _1 Fall Risk Fall Risk  11/25/2020 10/30/2020 07/02/2020 01/04/2020 11/08/2019  Falls in the past year? 0 0 0 0 0  Number falls in past yr: 0 0 - - -  Injury with Fall? 0 0 - - -  Risk for fall due to : - - No Fall Risks - -      TIMED UP AND GO:  Was the test performed? Yes .  Length of time to ambulate 10 feet: 2 sec.   Gait steady and fast without use of assistive device  Cognitive Function: No deficits         Immunizations Immunization History  Administered Date(s) Administered   Pneumococcal Polysaccharide-23 09/27/2017   Tdap 07/08/2014    TDAP status: Up to date    Pneumococcal vaccine status: Up to date  Covid-19 vaccine status: Declined, Education has been provided regarding the importance of this vaccine but patient still declined. Advised may receive this vaccine at local pharmacy or Health Dept.or vaccine clinic. Aware to provide a copy of the vaccination record if obtained from local pharmacy or Health Dept. Verbalized acceptance and understanding.  Qualifies  for Shingles Vaccine? No     Screening Tests Health Maintenance  Topic Date Due   COVID-19 Vaccine (1) Never done   Pneumococcal Vaccine 20-18 Years old (1 - PCV) Never done   FOOT EXAM  07/23/2020   OPHTHALMOLOGY EXAM  11/15/2020   HEMOGLOBIN A1C  05/25/2021   INFLUENZA VACCINE  05/26/2021   PAP SMEAR-Modifier  09/14/2023   TETANUS/TDAP  07/08/2024   Zoster Vaccines- Shingrix (1 of 2) 11/10/2025   COLONOSCOPY (Pts 45-22yr Insurance coverage will need to be confirmed)  08/16/2026   PNEUMOCOCCAL POLYSACCHARIDE VACCINE AGE 14-64 HIGH RISK  Completed   Hepatitis C Screening  Completed   HIV Screening  Completed   HPV VACCINES  Aged Out    Health Maintenance  Health Maintenance Due  Topic Date Due   COVID-19 Vaccine (1) Never done   Pneumococcal Vaccine 037622Years old (1 - PCV) Never done   FOOT EXAM  07/23/2020   OPHTHALMOLOGY EXAM  11/15/2020    Colorectal cancer screening, according to new guidelines patient may be eligible for screening. Defer to PCP to discuss and scheduled.  Mammogram status: Completed 2021. Repeat every year    Additional Screening:  Hepatitis C Screening: does qualify; Completed   Vision Screening: Recommended annual ophthalmology exams for early detection of glaucoma and other disorders of the eye. Vision  Dental Screening: Recommended annual dental exams for proper oral hygiene      Plan:    [facility]. Prefers to continue PCP management of GAD and Depression.   Rose have personally reviewed and noted the following in the patient's chart:   Medical and social history Use of alcohol, tobacco or illicit drugs  Current medications and supplements including opioid prescriptions.  Functional ability and status Nutritional status Physical activity Advanced directives List of other physicians Hospitalizations, surgeries, and ER visits in previous 12 months Vitals Screenings to include  cognitive, depression, and  falls Referrals and appointments  In addition, Rose have reviewed and discussed with patient certain preventive protocols, quality metrics, and best practice recommendations. A written personalized care plan for preventive services as well as general preventive health recommendations were provided to patient.     Molli Barrows, FNP   04/09/2021

## 2021-04-10 ENCOUNTER — Other Ambulatory Visit: Payer: Self-pay

## 2021-04-10 ENCOUNTER — Ambulatory Visit: Payer: Medicare HMO | Admitting: Internal Medicine

## 2021-04-10 ENCOUNTER — Ambulatory Visit: Payer: Self-pay | Admitting: *Deleted

## 2021-04-10 NOTE — Telephone Encounter (Signed)
Patient had  appointment for 2:50p today. She is in the ED with her daughter who is having an emergency so she will not make today's appointment. I have cancelled today's appointment. Patient stating doctor was very concerned with her unexplained bruising and excess protein in her urine. Explained no appointments available until August.  I am unsure of the urgency which she should be seen-definitely not UC level.  Routing to clinic.

## 2021-04-11 NOTE — Telephone Encounter (Signed)
Noted  

## 2021-04-14 ENCOUNTER — Other Ambulatory Visit: Payer: Self-pay

## 2021-04-15 ENCOUNTER — Ambulatory Visit (INDEPENDENT_AMBULATORY_CARE_PROVIDER_SITE_OTHER): Payer: Medicare HMO | Admitting: Pharmacist

## 2021-04-15 ENCOUNTER — Other Ambulatory Visit: Payer: Self-pay

## 2021-04-15 VITALS — BP 120/80 | HR 74 | Resp 17 | Ht 63.0 in | Wt 193.8 lb

## 2021-04-15 DIAGNOSIS — I10 Essential (primary) hypertension: Secondary | ICD-10-CM | POA: Diagnosis not present

## 2021-04-15 DIAGNOSIS — E7800 Pure hypercholesterolemia, unspecified: Secondary | ICD-10-CM | POA: Diagnosis not present

## 2021-04-15 MED ORDER — IRBESARTAN 150 MG PO TABS: 150.0000 mg | ORAL_TABLET | Freq: Every day | ORAL | 1 refills | Status: DC

## 2021-04-15 NOTE — Patient Instructions (Addendum)
Return for a follow up appointment in 6-8 weeks  *CALL [facility] for PREP exercise program at [phone]*  Check your blood pressure at home daily (if able) and keep record of the readings.  Take your BP meds as follows: *STOP taking lisinopril* *START taking irbesartan 150mg  daily(start 24 hours after last dose of lisinopril)  Bring all of your meds, your BP cuff and your record of home blood pressures to your next appointment.  Exercise as you're able, try to walk approximately 30 minutes per day.  Keep salt intake to a minimum, especially watch canned and prepared boxed foods.  Eat more fresh fruits and vegetables and fewer canned items.  Avoid eating in fast food restaurants.    HOW TO TAKE YOUR BLOOD PRESSURE: Rest 5 minutes before taking your blood pressure.  Don't smoke or drink caffeinated beverages for at least 30 minutes before. Take your blood pressure before (not after) you eat. Sit comfortably with your back supported and both feet on the floor (don't cross your legs). Elevate your arm to heart level on a table or a desk. Use the proper sized cuff. It should fit smoothly and snugly around your bare upper arm. There should be enough room to slip a fingertip under the cuff. The bottom edge of the cuff should be 1 inch above the crease of the elbow. Ideally, take 3 measurements at one sitting and record the average.    Your Results:             Your most recent labs Goal  Total Cholesterol 244 < 200  Triglycerides 94 < 150  HDL (good cholesterol) 59 > 40  LDL (bad cholesterol) 169 < 70      Medication changes: *Start PCSK9i (repatha/praluent) every 14 days  Lab orders: *Repeat fasting blood work after 4 dose of new medication*  Patient Assistance:  The [employer] foundation offers assistance to help pay for medication copays.  They will cover copays for all cholesterol lowering meds, including statins, fibrates, omega-3 oils, ezetimibe, Repatha, Praluent, Nexletol,  Nexlizet.  The cards are usually good for $2,500 or 12 months, whichever comes first. Go to healthwellfoundation.org Click on "Apply Now" Answer questions as to whom is applying (patient or representative) Your disease fund will be "hypercholesterolemia - Medicare access" They will ask questions about finances and which medications you are taking for cholesterol When you submit, the approval is usually within minutes.  You will need to print the card information from the site You will need to show this information to your pharmacy, they will bill your Medicare Part D plan first -then bill [employer] Well --for the copay.   You can also call them at [phone], although the hold times can be quite long.   Thank you for choosing [facility]

## 2021-04-15 NOTE — Progress Notes (Signed)
Patient ID: Amber Rose                 DOB: [birth year]                      MRN: [id number]     HPI: Amber Rose is a 45 y.o. female referred by Dr. Oval Linsey to pharmacist clinic for HTN and lipid management. PMH includes hypertension, diabetes, tobacco abuse, aorta atherosclerosis, fibromyalgia, depression, and anxiety. Noted smoking cessation counseling plus medication was provided during last OV, with Dr Oval Linsey. Patient was referred to counseling with Avelino Leeds to work on positive lifestyle modifications. Referral for PREP also in place, and Dash Diet recommended.   Patient present to clinic for overdue follow up.  She still interested on PREP program and request phone number to call Pam. Will like to place smoking cessation on HOLD for now, and reports getting most food from the pantry and unable to follow up Dash Diet recommendations. Patient report compliance with medication, but noticed dry cough since place on lisinopril.  Current HTN meds:  Lisinopril 81m daily - 4pm experiencing dry cough Amlodipine 158mdaily - 4pm  Cholesterol medication: none  Intolerance: Atorvastatin  Rosuvastatin   Previously tried:  Amlodipine HCTZ - leg cramps  BP goal: <130/80  Family History: family history includes Cancer in her mother; Diabetes in her father; Heart attack in her maternal grandfather; Hypertension in her mother; Multiple sclerosis in her paternal grandmother. There is no history of Allergic rhinitis, Angioedema, Asthma, Eczema, Urticaria, Colon cancer, Esophageal cancer, Liver disease, Pancreatic cancer, Prostate cancer, Rectal cancer, or Stomach cancer.  Social History: She has no caffeine or alcohol. Marihuana?  Diet: struggles with sodium intake  Exercise: She exercise daily with Youtube videos for 30 minutes daily  Home BP readings: not checking at home  Wt Readings from Last 3 Encounters:  04/15/21 193 lb 12.8 oz (87.9 kg)  04/02/21 194 lb 3.2 oz (88.1 kg)   01/20/21 192 lb 9.6 oz (87.4 kg)   BP Readings from Last 3 Encounters:  04/15/21 120/80  04/02/21 120/78  01/20/21 133/82   Pulse Readings from Last 3 Encounters:  04/15/21 74  01/20/21 82  12/12/20 74    Past Medical History:  Diagnosis Date   Allergy    Anxiety    Arthritis    Chronic UTI    Depression    Diabetes mellitus without complication (HCC)    Diverticulosis    Fibromyalgia    GERD (gastroesophageal reflux disease)    Hiatal hernia    Hyperlipidemia    Hypertension    Internal hemorrhoids    Meningitis, viral    Pancreatitis    Pure hypercholesterolemia 12/23/2020    Current Outpatient Medications on File Prior to Visit  Medication Sig Dispense Refill   acyclovir (ZOVIRAX) 400 MG tablet TAKE 1 TABLET BY MOUTH TWICE A DAY 180 tablet 0   albuterol (PROAIR HFA) 108 (90 Base) MCG/ACT inhaler Inhale 2 puffs into the lungs every 4 (four) hours as needed for wheezing or shortness of breath. 3 Inhaler 0   amLODipine (NORVASC) 10 MG tablet Take 1 tablet (10 mg total) by mouth daily. 90 tablet 3   Blood Glucose Monitoring Suppl (ACCU-CHEK AVIVA PLUS) w/Device KIT USE AS DIRECTED DAILY. E11.9 1 kit 0   buPROPion (WELLBUTRIN SR) 150 MG 12 hr tablet 1 tablet daily for 3 days and then twice a day for 12 weeks 30 tablet 1   Exenatide ER  2 MG/0.85ML AUIJ Inject 2 mg into the skin once a week. 3.4 mL 2   fluconazole (DIFLUCAN) 150 MG tablet Take 150 mg by mouth daily as needed.     gabapentin (NEURONTIN) 300 MG capsule Take 1 capsule (300 mg total) by mouth 3 (three) times daily. Discontinue once Lyrica is obtained. 90 capsule 3   hydrOXYzine (ATARAX/VISTARIL) 25 MG tablet Take 1 tablet (25 mg total) by mouth 3 (three) times daily as needed. 270 tablet 1   Lancet Devices (ACCU-CHEK SOFTCLIX) lancets Use as instructed daily. 1 each 5   methocarbamol (ROBAXIN) 750 MG tablet Take 1 tablet (750 mg total) by mouth every 8 (eight) hours as needed for muscle spasms. 180 tablet 1    Multiple Vitamin (MULTI-VITAMIN DAILY) TABS Take 1 tablet by mouth daily as needed.     omeprazole (PRILOSEC) 40 MG capsule Take 1 tab every morning. 90 capsule 3   predniSONE (DELTASONE) 20 MG tablet Take 20 mg by mouth daily as needed.     Vitamin D, Ergocalciferol, (DRISDOL) 1.25 MG (50000 UNIT) CAPS capsule TAKE 1 CAPSULE BY MOUTH ONE TIME PER WEEK 4 capsule 0   No current facility-administered medications on file prior to visit.    Allergies  Allergen Reactions   Atorvastatin Other (See Comments)     Joint pain   Crestor [Rosuvastatin]     Joint pain    Metformin And Related Nausea And Vomiting   Metronidazole Nausea And Vomiting   Penicillins     childhood   Xanax [Alprazolam]     "Caused upper respiratory symptoms" per pt   Glyburide Other (See Comments)    "blood sugar dropped uncontrollably"   Linagliptin Diarrhea and Other (See Comments)    Stomach pain, sinus infection   Moxifloxacin Diarrhea    Blood pressure 120/80, pulse 74, resp. rate 17, height '5\' 3"'  (1.6 m), weight 193 lb 12.8 oz (87.9 kg), last menstrual period 03/29/2021, SpO2 92 %.  No problem-specific Assessment & Plan notes found for this encounter.   Seamus Warehime Rodriguez-Guzman PharmD, BCPS, [place] [address] [place] [id number] 04/21/2021 1:54 PM

## 2021-04-17 ENCOUNTER — Other Ambulatory Visit: Payer: Self-pay | Admitting: Family Medicine

## 2021-04-21 ENCOUNTER — Telehealth: Payer: Self-pay

## 2021-04-21 ENCOUNTER — Encounter: Payer: Self-pay | Admitting: Pharmacist

## 2021-04-21 DIAGNOSIS — E7800 Pure hypercholesterolemia, unspecified: Secondary | ICD-10-CM

## 2021-04-21 MED ORDER — PRALUENT 150 MG/ML ~~LOC~~ SOAJ: 150.0000 mg | SUBCUTANEOUS | 11 refills | Status: DC

## 2021-04-21 NOTE — Telephone Encounter (Signed)
Lmomed the pt that the praluent 150mg  was approved, rx sent, pt instructed to call back if unaffordable, told pt to complete fasting lipid 4th dose (ordered and released).

## 2021-04-21 NOTE — Assessment & Plan Note (Signed)
Blood pressure at goal today, but patient is experiencing ADR to lisinopril. Will discontinue lisinopril, and start irbesartan 150mg  daily. Patient encouraged to call [facility] as soon as possible to schedule start day for exercise program (telephone number provided). Tips given to decrease sodium when continue to use food pantry. Will follow up in 6-8 weeks to assess progress and further titrate irbesartan if needed.

## 2021-04-21 NOTE — Assessment & Plan Note (Addendum)
LDL remains above goal for secondary prevention, and patient was unable to tolerate atorvastatin or rosuvastatin. PCSK9i therapy discussed with patient.   Prior authorization process, storage, administration, common side effects, and monitoring were discussed during OV. Will start process to obtain PCSK9i and follow up as needed. Plan to repeat fasting blood work after 4rd dose of new medication.

## 2021-04-22 ENCOUNTER — Other Ambulatory Visit: Payer: Self-pay

## 2021-04-30 ENCOUNTER — Other Ambulatory Visit: Payer: Self-pay

## 2021-05-04 ENCOUNTER — Encounter: Payer: Self-pay | Admitting: Family Medicine

## 2021-05-04 ENCOUNTER — Other Ambulatory Visit: Payer: Self-pay | Admitting: Family Medicine

## 2021-05-04 DIAGNOSIS — Z8619 Personal history of other infectious and parasitic diseases: Secondary | ICD-10-CM

## 2021-05-05 ENCOUNTER — Other Ambulatory Visit: Payer: Self-pay | Admitting: Internal Medicine

## 2021-05-05 ENCOUNTER — Other Ambulatory Visit: Payer: Self-pay | Admitting: Pharmacist

## 2021-05-05 MED ORDER — FLUCONAZOLE 150 MG PO TABS: 150.0000 mg | ORAL_TABLET | Freq: Every day | ORAL | 1 refills | Status: DC | PRN

## 2021-05-09 ENCOUNTER — Other Ambulatory Visit: Payer: Self-pay | Admitting: Cardiovascular Disease

## 2021-06-02 ENCOUNTER — Other Ambulatory Visit: Payer: Self-pay

## 2021-06-02 ENCOUNTER — Other Ambulatory Visit: Payer: Self-pay | Admitting: Family Medicine

## 2021-06-02 NOTE — Telephone Encounter (Signed)
Requested medication (s) are due for refill today - yes  Requested medication (s) are on the active medication list -yes  Future visit scheduled -yes  Last refill: 04/30/21  Notes to clinic: Rx states changes made to Rx- only change apparent- subcutaneous not in directions- patient does have appointment scheduled for tomorrow   Requested Prescriptions  Pending Prescriptions Disp Refills   Exenatide ER (BYDUREON BCISE) 2 MG/0.85ML AUIJ 3.4 mL 2    Sig: Inject 2 mg into the skin once a week.      Endocrinology:  Diabetes - GLP-1 Receptor Agonists - exenatide Failed - 06/02/2021 10:44 AM      Failed - HBA1C is between 0 and 7.9 and within 180 days    HbA1c, POC (controlled diabetic range)  Date Value Ref Range Status  11/25/2020 6.8 0.0 - 7.0 % Final          Failed - Valid encounter within last 6 months    Recent Outpatient Visits           2 months ago Medicare annual wellness visit, subsequent   [place], Carroll Sage, FNP   6 months ago Type 2 diabetes mellitus with other neurologic complication, without long-term current use of insulin ([place])   [place], [place], MD   7 months ago [place], Charlane Ferretti, MD   11 months ago Type 2 diabetes mellitus with other neurologic complication, without long-term current use of insulin ([place])   [place], [place], MD   1 year ago [place], [place], MD       Future Appointments             Tomorrow Charlott Rakes, MD [place]   In 1 week  [facility]             Passed - Cr in normal range and within 360 days    Creat  Date Value Ref Range Status  10/28/2016 0.64 0.50 - 1.10 mg/dL Final   Creatinine, Ser  Date Value Ref Range Status  12/12/2020 0.61 0.57 -  1.00 mg/dL Final   Creatinine, Urine  Date Value Ref Range Status  10/28/2016 158 20 - 320 mg/dL Final          Passed - AA eGFR in normal range and within 360 days    GFR, Est African American  Date Value Ref Range Status  10/28/2016 >89 >=60 mL/min Final   GFR calc Af Amer  Date Value Ref Range Status  12/12/2020 127 >59 mL/min/1.73 Final    Comment:    **In accordance with recommendations from the NKF-ASN Task force,**   [facility] is in the process of updating its eGFR calculation to the   2021 CKD-EPI creatinine equation that estimates kidney function   without a race variable.    GFR, Est Non African American  Date Value Ref Range Status  10/28/2016 >89 >=60 mL/min Final   GFR calc non Af Amer  Date Value Ref Range Status  12/12/2020 110 >59 mL/min/1.73 Final              Requested Prescriptions  Pending Prescriptions Disp Refills   Exenatide ER (BYDUREON BCISE) 2 MG/0.85ML AUIJ 3.4 mL 2    Sig: Inject 2 mg into the skin once a week.  Endocrinology:  Diabetes - GLP-1 Receptor Agonists - exenatide Failed - 06/02/2021 10:44 AM      Failed - HBA1C is between 0 and 7.9 and within 180 days    HbA1c, POC (controlled diabetic range)  Date Value Ref Range Status  11/25/2020 6.8 0.0 - 7.0 % Final          Failed - Valid encounter within last 6 months    Recent Outpatient Visits           2 months ago Medicare annual wellness visit, subsequent   [place], Carroll Sage, FNP   6 months ago Type 2 diabetes mellitus with other neurologic complication, without long-term current use of insulin ([place])   [place], [place], MD   7 months ago [place], Charlane Ferretti, MD   11 months ago Type 2 diabetes mellitus with other neurologic complication, without long-term current use of insulin ([place])   [place],  [place], MD   1 year ago [place], [place], MD       Future Appointments             Tomorrow Charlott Rakes, MD [place]   In 1 week  [facility]             Passed - Cr in normal range and within 360 days    Creat  Date Value Ref Range Status  10/28/2016 0.64 0.50 - 1.10 mg/dL Final   Creatinine, Ser  Date Value Ref Range Status  12/12/2020 0.61 0.57 - 1.00 mg/dL Final   Creatinine, Urine  Date Value Ref Range Status  10/28/2016 158 20 - 320 mg/dL Final          Passed - AA eGFR in normal range and within 360 days    GFR, Est African American  Date Value Ref Range Status  10/28/2016 >89 >=60 mL/min Final   GFR calc Af Amer  Date Value Ref Range Status  12/12/2020 127 >59 mL/min/1.73 Final    Comment:    **In accordance with recommendations from the NKF-ASN Task force,**   [facility] is in the process of updating its eGFR calculation to the   2021 CKD-EPI creatinine equation that estimates kidney function   without a race variable.    GFR, Est Non African American  Date Value Ref Range Status  10/28/2016 >89 >=60 mL/min Final   GFR calc non Af Amer  Date Value Ref Range Status  12/12/2020 110 >59 mL/min/1.73 Final

## 2021-06-03 ENCOUNTER — Ambulatory Visit: Payer: Medicare HMO | Attending: Family Medicine | Admitting: Family Medicine

## 2021-06-03 ENCOUNTER — Encounter: Payer: Self-pay | Admitting: Family Medicine

## 2021-06-03 ENCOUNTER — Other Ambulatory Visit: Payer: Self-pay

## 2021-06-03 DIAGNOSIS — E1149 Type 2 diabetes mellitus with other diabetic neurological complication: Secondary | ICD-10-CM | POA: Diagnosis not present

## 2021-06-03 DIAGNOSIS — M791 Myalgia, unspecified site: Secondary | ICD-10-CM | POA: Diagnosis not present

## 2021-06-03 DIAGNOSIS — B373 Candidiasis of vulva and vagina: Secondary | ICD-10-CM | POA: Diagnosis not present

## 2021-06-03 DIAGNOSIS — E785 Hyperlipidemia, unspecified: Secondary | ICD-10-CM | POA: Diagnosis not present

## 2021-06-03 DIAGNOSIS — E1169 Type 2 diabetes mellitus with other specified complication: Secondary | ICD-10-CM

## 2021-06-03 DIAGNOSIS — B3731 Acute candidiasis of vulva and vagina: Secondary | ICD-10-CM

## 2021-06-03 MED ORDER — FLUCONAZOLE 150 MG PO TABS: 150.0000 mg | ORAL_TABLET | Freq: Every day | ORAL | 4 refills | Status: DC | PRN

## 2021-06-03 MED ORDER — BYDUREON BCISE 2 MG/0.85ML ~~LOC~~ AUIJ
2.0000 mg | AUTO-INJECTOR | SUBCUTANEOUS | 2 refills | Status: DC
  Filled 2021-06-03: qty 3.4, 28d supply, fill #0
  Filled 2021-06-25: qty 6.8, 56d supply, fill #0

## 2021-06-03 NOTE — Progress Notes (Signed)
Virtual Visit via Telephone Note  I connected with Amber Rose, on 06/03/2021 at 3:13 PM by telephone due to the COVID-19 pandemic and verified that I am speaking with the correct person using two identifiers.   Consent: I discussed the limitations, risks, security and privacy concerns of performing an evaluation and management service by telephone and the availability of in person appointments. I also discussed with the patient that there may be a patient responsible charge related to this service. The patient expressed understanding and agreed to proceed.   Location of Patient: Home  Location of Provider: Clinic   Persons participating in Telemedicine visit: [name] Dr. Margarita Rana     History of Present Illness: Amber Rose is a 45 y.o. year old female with type 2 diabetes mellitus (A1c 6.8), hypertension, depression and anxiety, hydradenitis, tobacco abuse, fibromyalgia seen for a follow up visit.   She has been off her Statin and noticed improvement in her leg symptoms including resolution of her facial swelling.The pain from her neck to her back and cramping of her hands ave subsided. Her legs still hurt but she has better mobility. She still has bruising in her legs and intermittent pedal edema. Gabapentin has been ineffective; she was unable to obtain Lyrica due to cost. Chart review she was placed on a PCSK9 inhibitor by cardiology but she is yet to pick this up as she is traumatized from the symptoms she experienced as a result of statin.  I had referred her to [facility] rheumatology early on in the year but she is yet to receive an appointment from them. Concerned about proteinuria which she was informed of during an office visit and would like this evaluated. She is compliant with Trulicity for her diabetes.  She requests refills of Diflucan as she has found she has recurrent yeast infections. Past Medical History:  Diagnosis Date   Allergy     Anxiety    Arthritis    Chronic UTI    Depression    Diabetes mellitus without complication (HCC)    Diverticulosis    Fibromyalgia    GERD (gastroesophageal reflux disease)    Hiatal hernia    Hyperlipidemia    Hypertension    Internal hemorrhoids    Meningitis, viral    Pancreatitis    Pure hypercholesterolemia 12/23/2020   Allergies  Allergen Reactions   Atorvastatin Other (See Comments)     Joint pain   Crestor [Rosuvastatin]     Joint pain    Metformin And Related Nausea And Vomiting   Metronidazole Nausea And Vomiting   Penicillins     childhood   Xanax [Alprazolam]     "Caused upper respiratory symptoms" per pt   Glyburide Other (See Comments)    "blood sugar dropped uncontrollably"   Linagliptin Diarrhea and Other (See Comments)    Stomach pain, sinus infection   Moxifloxacin Diarrhea    Current Outpatient Medications on File Prior to Visit  Medication Sig Dispense Refill   acyclovir (ZOVIRAX) 400 MG tablet TAKE 1 TABLET BY MOUTH TWICE A DAY 180 tablet 0   albuterol (PROAIR HFA) 108 (90 Base) MCG/ACT inhaler Inhale 2 puffs into the lungs every 4 (four) hours as needed for wheezing or shortness of breath. 3 Inhaler 0   Alirocumab (PRALUENT) 150 MG/ML SOAJ Inject 150 mg into the skin every 14 (fourteen) days. 2 mL 11   amLODipine (NORVASC) 10 MG tablet Take 1 tablet (10 mg total) by mouth daily. 90 tablet 3  Blood Glucose Monitoring Suppl (ACCU-CHEK AVIVA PLUS) w/Device KIT USE AS DIRECTED DAILY. E11.9 1 kit 0   buPROPion (WELLBUTRIN SR) 150 MG 12 hr tablet 1 tablet daily for 3 days and then twice a day for 12 weeks 30 tablet 1   Exenatide ER (BYDUREON BCISE) 2 MG/0.85ML AUIJ Inject 2 mg into the skin once a week. 3.4 mL 2   fluconazole (DIFLUCAN) 150 MG tablet Take 1 tablet (150 mg total) by mouth daily as needed. 2 tablet 1   gabapentin (NEURONTIN) 300 MG capsule Take 1 capsule (300 mg total) by mouth 3 (three) times daily. Discontinue once Lyrica is obtained. 90  capsule 3   hydrOXYzine (ATARAX/VISTARIL) 25 MG tablet Take 1 tablet (25 mg total) by mouth 3 (three) times daily as needed. 270 tablet 1   ibuprofen (ADVIL) 600 MG tablet TAKE 1 TABLET BY MOUTH EVERY 8 HOURS AS NEEDED 60 tablet 1   irbesartan (AVAPRO) 150 MG tablet TAKE 1 TABLET DAILY. TO REPLACE LISINOPRIL. START TAKING 24 HOUR AFTER LAST LISINOPRIL DOSE. 30 tablet 11   Lancet Devices (ACCU-CHEK SOFTCLIX) lancets Use as instructed daily. 1 each 5   methocarbamol (ROBAXIN) 750 MG tablet Take 1 tablet (750 mg total) by mouth every 8 (eight) hours as needed for muscle spasms. 180 tablet 1   Multiple Vitamin (MULTI-VITAMIN DAILY) TABS Take 1 tablet by mouth daily as needed.     omeprazole (PRILOSEC) 40 MG capsule Take 1 tab every morning. 90 capsule 3   predniSONE (DELTASONE) 20 MG tablet Take 20 mg by mouth daily as needed.     Vitamin D, Ergocalciferol, (DRISDOL) 1.25 MG (50000 UNIT) CAPS capsule TAKE 1 CAPSULE BY MOUTH ONE TIME PER WEEK 4 capsule 0   No current facility-administered medications on file prior to visit.    ROS: See HPI  Observations/Objective: Awake, alert, ranted x3 Not in acute distress Normal mood  CMP Latest Ref Rng & Units 12/12/2020 11/26/2020 07/02/2020  Glucose 65 - 99 mg/dL 101(H) 142(H) 158(H)  BUN 6 - 24 mg/dL '11 13 14  ' Creatinine 0.57 - 1.00 mg/dL 0.61 0.72 0.70  Sodium 134 - 144 mmol/L 138 139 141  Potassium 3.5 - 5.2 mmol/L 4.3 4.9 3.9  Chloride 96 - 106 mmol/L 98 106 106  CO2 20 - 29 mmol/L '21 23 24  ' Calcium 8.7 - 10.2 mg/dL 9.4 8.9 8.9  Total Protein 6.0 - 8.5 g/dL - 6.3 6.4  Total Bilirubin 0.0 - 1.2 mg/dL - <0.2 <0.2  Alkaline Phos 44 - 121 IU/L - 83 105  AST 0 - 40 IU/L - 13 9  ALT 0 - 32 IU/L - 15 9    Lipid Panel     Component Value Date/Time   CHOL 244 (H) 11/26/2020 0945   TRIG 94 11/26/2020 0945   HDL 59 11/26/2020 0945   CHOLHDL 4.1 11/26/2020 0945   CHOLHDL 4.4 10/28/2016 0851   LDLCALC 169 (H) 11/26/2020 0945   LABVLDL 16 11/26/2020  0945    Lab Results  Component Value Date   HGBA1C 6.8 11/25/2020    Assessment and Plan: 1. Type 2 diabetes mellitus with other neurologic complication, without long-term current use of insulin (HCC) Controlled with A1c of 6.8 Will check A1c again and also microalbumin Continue current regimen Counseled on Diabetic diet, my plate method, 333 minutes of moderate intensity exercise/week Blood sugar logs with fasting goals of 80-120 mg/dl, random of less than 180 and in the event of sugars less than 60  mg/dl or greater than 400 mg/dl encouraged to notify the clinic. Advised on the need for annual eye exams, annual foot exams, Pneumonia vaccine. - Hemoglobin A1c; Future - Microalbumin / creatinine urine ratio; Future  2. Hyperlipidemia associated with type 2 diabetes mellitus (HCC) Statin intolerant Advised to pick up PCSK9 inhibitor prescribed by cardiology  3. Myalgia Improved after statin was discontinued She does have some residual pain and bruising Provided the number to [facility] rheumatology to set up an appointment  4. Vaginal candidiasis Diflucan prescribed   Follow Up Instructions: 6 months   I discussed the assessment and treatment plan with the patient. The patient was provided an opportunity to ask questions and all were answered. The patient agreed with the plan and demonstrated an understanding of the instructions.   The patient was advised to call back or seek an in-person evaluation if the symptoms worsen or if the condition fails to improve as anticipated.     I provided 17 minutes total of non-face-to-face time during this encounter.   Charlott Rakes, MD, FAAFP. [facility] [place], [place]   06/03/2021, 3:13 PM

## 2021-06-04 ENCOUNTER — Encounter: Payer: Self-pay | Admitting: Family Medicine

## 2021-06-04 ENCOUNTER — Other Ambulatory Visit: Payer: Self-pay

## 2021-06-04 DIAGNOSIS — E1149 Type 2 diabetes mellitus with other diabetic neurological complication: Secondary | ICD-10-CM

## 2021-06-05 LAB — MICROALBUMIN / CREATININE URINE RATIO
Creatinine, Urine: 120.5 mg/dL
Microalb/Creat Ratio: 26 mg/g creat (ref 0–29)
Microalbumin, Urine: 30.9 ug/mL

## 2021-06-05 LAB — HEMOGLOBIN A1C
Est. average glucose Bld gHb Est-mCnc: 163 mg/dL
Hgb A1c MFr Bld: 7.3 % — ABNORMAL HIGH (ref 4.8–5.6)

## 2021-06-06 ENCOUNTER — Other Ambulatory Visit: Payer: Self-pay | Admitting: Family Medicine

## 2021-06-06 MED ORDER — DAPAGLIFLOZIN PROPANEDIOL 5 MG PO TABS: 5.0000 mg | ORAL_TABLET | Freq: Every day | ORAL | 3 refills | Status: DC

## 2021-06-10 ENCOUNTER — Ambulatory Visit: Payer: Medicare HMO

## 2021-06-10 NOTE — Progress Notes (Deleted)
Patient ID: Amber Rose                 DOB: 1976/01/11                      MRN: [id number]     HPI: Amber Rose is a 45 y.o. female referred by Dr. Oval Linsey to pharmacist clinic for HTN and lipid management. PMH includes hypertension, diabetes, tobacco abuse, aorta atherosclerosis, fibromyalgia, depression, and anxiety. Noted smoking cessation counseling plus medication was provided during last OV, with Dr Oval Linsey. Patient was referred to counseling with Avelino Leeds to work on positive lifestyle modifications. Referral for PREP also in place, and Dash Diet recommended.   Patient present to clinic for overdue follow up.  She still interested on PREP program and request phone number to call [facility]. Will like to place smoking cessation on HOLD for now, and reports getting most food from the pantry and unable to follow up Dash Diet recommendations. Patient report compliance with medication, but noticed dry cough since place on lisinopril.  At last visit was started on alirocumab 150 mg (04/15/21), needs lipid panel if on dose 4 or greater  Current HTN meds:  Lisinopril 62m daily - 4pm experiencing dry cough Amlodipine 14mdaily - 4pm  Cholesterol medication: none  Intolerance: Atorvastatin  Rosuvastatin   Previously tried:  Amlodipine HCTZ - leg cramps  BP goal: <130/80  Family History: family history includes Cancer in her mother; Diabetes in her father; Heart attack in her maternal grandfather; Hypertension in her mother; Multiple sclerosis in her paternal grandmother. There is no history of Allergic rhinitis, Angioedema, Asthma, Eczema, Urticaria, Colon cancer, Esophageal cancer, Liver disease, Pancreatic cancer, Prostate cancer, Rectal cancer, or Stomach cancer.  Social History: She has no caffeine or alcohol.   Diet: struggles with sodium intake  Exercise: She exercise daily with Youtube videos for 30 minutes daily  Home BP readings: not checking at home  Wt Readings  from Last 3 Encounters:  04/15/21 193 lb 12.8 oz (87.9 kg)  04/02/21 194 lb 3.2 oz (88.1 kg)  01/20/21 192 lb 9.6 oz (87.4 kg)   BP Readings from Last 3 Encounters:  04/15/21 120/80  04/02/21 120/78  01/20/21 133/82   Pulse Readings from Last 3 Encounters:  04/15/21 74  01/20/21 82  12/12/20 74    Past Medical History:  Diagnosis Date   Allergy    Anxiety    Arthritis    Chronic UTI    Depression    Diabetes mellitus without complication (HCC)    Diverticulosis    Fibromyalgia    GERD (gastroesophageal reflux disease)    Hiatal hernia    Hyperlipidemia    Hypertension    Internal hemorrhoids    Meningitis, viral    Pancreatitis    Pure hypercholesterolemia 12/23/2020    Current Outpatient Medications on File Prior to Visit  Medication Sig Dispense Refill   acyclovir (ZOVIRAX) 400 MG tablet TAKE 1 TABLET BY MOUTH TWICE A DAY 180 tablet 0   albuterol (PROAIR HFA) 108 (90 Base) MCG/ACT inhaler Inhale 2 puffs into the lungs every 4 (four) hours as needed for wheezing or shortness of breath. 3 Inhaler 0   Alirocumab (PRALUENT) 150 MG/ML SOAJ Inject 150 mg into the skin every 14 (fourteen) days. 2 mL 11   amLODipine (NORVASC) 10 MG tablet Take 1 tablet (10 mg total) by mouth daily. 90 tablet 3   Blood Glucose Monitoring Suppl (ACCU-CHEK AVIVA PLUS) w/Device  KIT USE AS DIRECTED DAILY. E11.9 1 kit 0   buPROPion (WELLBUTRIN SR) 150 MG 12 hr tablet 1 tablet daily for 3 days and then twice a day for 12 weeks 30 tablet 1   dapagliflozin propanediol (FARXIGA) 5 MG TABS tablet Take 1 tablet (5 mg total) by mouth daily before breakfast. 30 tablet 3   Exenatide ER (BYDUREON BCISE) 2 MG/0.85ML AUIJ Inject 2 mg into the skin once a week. 3.4 mL 2   fluconazole (DIFLUCAN) 150 MG tablet Take 1 tablet (150 mg total) by mouth daily as needed. 2 tablet 4   gabapentin (NEURONTIN) 300 MG capsule Take 1 capsule (300 mg total) by mouth 3 (three) times daily. Discontinue once Lyrica is obtained. 90  capsule 3   hydrOXYzine (ATARAX/VISTARIL) 25 MG tablet Take 1 tablet (25 mg total) by mouth 3 (three) times daily as needed. 270 tablet 1   ibuprofen (ADVIL) 600 MG tablet TAKE 1 TABLET BY MOUTH EVERY 8 HOURS AS NEEDED 60 tablet 1   irbesartan (AVAPRO) 150 MG tablet TAKE 1 TABLET DAILY. TO REPLACE LISINOPRIL. START TAKING 24 HOUR AFTER LAST LISINOPRIL DOSE. 30 tablet 11   Lancet Devices (ACCU-CHEK SOFTCLIX) lancets Use as instructed daily. 1 each 5   methocarbamol (ROBAXIN) 750 MG tablet Take 1 tablet (750 mg total) by mouth every 8 (eight) hours as needed for muscle spasms. 180 tablet 1   Multiple Vitamin (MULTI-VITAMIN DAILY) TABS Take 1 tablet by mouth daily as needed.     omeprazole (PRILOSEC) 40 MG capsule Take 1 tab every morning. 90 capsule 3   predniSONE (DELTASONE) 20 MG tablet Take 20 mg by mouth daily as needed.     Vitamin D, Ergocalciferol, (DRISDOL) 1.25 MG (50000 UNIT) CAPS capsule TAKE 1 CAPSULE BY MOUTH ONE TIME PER WEEK 4 capsule 0   No current facility-administered medications on file prior to visit.    Allergies  Allergen Reactions   Atorvastatin Other (See Comments)     Joint pain   Crestor [Rosuvastatin]     Joint pain    Metformin And Related Nausea And Vomiting   Metronidazole Nausea And Vomiting   Penicillins     childhood   Xanax [Alprazolam]     "Caused upper respiratory symptoms" per pt   Glyburide Other (See Comments)    "blood sugar dropped uncontrollably"   Linagliptin Diarrhea and Other (See Comments)    Stomach pain, sinus infection   Moxifloxacin Diarrhea    There were no vitals taken for this visit.  No problem-specific Assessment & Plan notes found for this encounter.   Raquel Rodriguez-Guzman PharmD, BCPS, CPP [place] [id number] 06/10/2021 7:28 AM

## 2021-06-11 ENCOUNTER — Telehealth: Payer: Self-pay

## 2021-06-11 ENCOUNTER — Other Ambulatory Visit: Payer: Self-pay

## 2021-06-11 NOTE — Telephone Encounter (Signed)
LMOM TO R/S 

## 2021-06-25 ENCOUNTER — Other Ambulatory Visit: Payer: Self-pay

## 2021-06-26 ENCOUNTER — Other Ambulatory Visit: Payer: Self-pay

## 2021-06-27 ENCOUNTER — Ambulatory Visit: Payer: Medicare HMO | Attending: Family | Admitting: Family

## 2021-06-27 ENCOUNTER — Other Ambulatory Visit: Payer: Self-pay

## 2021-06-27 ENCOUNTER — Encounter: Payer: Self-pay | Admitting: Family

## 2021-06-27 ENCOUNTER — Ambulatory Visit: Payer: Self-pay

## 2021-06-27 VITALS — Ht 63.5 in | Wt 190.0 lb

## 2021-06-27 DIAGNOSIS — N3000 Acute cystitis without hematuria: Secondary | ICD-10-CM | POA: Diagnosis not present

## 2021-06-27 MED ORDER — NITROFURANTOIN MONOHYD MACRO 100 MG PO CAPS
100.0000 mg | ORAL_CAPSULE | Freq: Two times a day (BID) | ORAL | 0 refills | Status: DC
  Filled 2021-06-27: qty 14, 7d supply, fill #0

## 2021-06-27 NOTE — Telephone Encounter (Signed)
Pt has been seen today.

## 2021-06-27 NOTE — Progress Notes (Signed)
Amber Rose, is a 45 y.o. female  SNK:[id number]  PFX:[id number]  DOB - 1976/04/26  Subjective:  -Patient agreed for telephone visit -Patient was at home -Provider was at the clinic -The visit lasted 15 minutes   Chief Complaint and HPI: Amber Rose is a 45 y.o. female who was visited this morning via telephone mode for her complaints of urgency, burning on urination, lower abdominal pain, lower back pain, and change in color of her urine to dark orange.  This has been going on for 7 days according to the patient.  She denies fever, chills, nausea or vomiting, chest pain, or shortness of breath.   ED/Hospital notes reviewed.     ROS:   Constitutional:  No f/c, No night sweats, No unexplained weight loss. Respiratory: No cough, No SOB Cardiac: No CP, no palpitations GI: Some lower abd pain, No N/V/D. GU: Frequencies, urgencies, dysuria Endocrine:  No polydipsia. No polyuria.   No problems updated.  ALLERGIES: Allergies  Allergen Reactions   Atorvastatin Other (See Comments)     Joint pain   Crestor [Rosuvastatin]     Joint pain    Metformin And Related Nausea And Vomiting   Metronidazole Nausea And Vomiting   Penicillins     childhood   Xanax [Alprazolam]     "Caused upper respiratory symptoms" per pt   Glyburide Other (See Comments)    "blood sugar dropped uncontrollably"   Linagliptin Diarrhea and Other (See Comments)    Stomach pain, sinus infection   Moxifloxacin Diarrhea    PAST MEDICAL HISTORY: Past Medical History:  Diagnosis Date   Allergy    Anxiety    Arthritis    Chronic UTI    Depression    Diabetes mellitus without complication (HCC)    Diverticulosis    Fibromyalgia    GERD (gastroesophageal reflux disease)    Hiatal hernia    Hyperlipidemia    Hypertension    Internal hemorrhoids    Meningitis, viral    Pancreatitis    Pure hypercholesterolemia 12/23/2020    MEDICATIONS AT HOME: Prior to Admission medications    Medication Sig Start Date End Date Taking? Authorizing Provider  acyclovir (ZOVIRAX) 400 MG tablet TAKE 1 TABLET BY MOUTH TWICE A DAY 05/05/21  Yes Newlin, Enobong, MD  albuterol (PROAIR HFA) 108 (90 Base) MCG/ACT inhaler Inhale 2 puffs into the lungs every 4 (four) hours as needed for wheezing or shortness of breath. 04/05/18  Yes Newlin, Charlane Ferretti, MD  Alirocumab (PRALUENT) 150 MG/ML SOAJ Inject 150 mg into the skin every 14 (fourteen) days. 04/21/21  Yes Skeet Latch, MD  Blood Glucose Monitoring Suppl (ACCU-CHEK AVIVA PLUS) w/Device KIT USE AS DIRECTED DAILY. E11.9 09/13/17  Yes Charlott Rakes, MD  buPROPion (WELLBUTRIN SR) 150 MG 12 hr tablet 1 tablet daily for 3 days and then twice a day for 12 weeks 01/01/21  Yes Skeet Latch, MD  dapagliflozin propanediol (FARXIGA) 5 MG TABS tablet Take 1 tablet (5 mg total) by mouth daily before breakfast. 06/06/21  Yes Newlin, Enobong, MD  Exenatide ER (BYDUREON BCISE) 2 MG/0.85ML AUIJ Inject 2 mg into the skin once a week. 06/03/21 06/03/22 Yes Charlott Rakes, MD  fluconazole (DIFLUCAN) 150 MG tablet Take 1 tablet (150 mg total) by mouth daily as needed. 06/03/21  Yes Charlott Rakes, MD  gabapentin (NEURONTIN) 300 MG capsule Take 1 capsule (300 mg total) by mouth 3 (three) times daily. Discontinue once Lyrica is obtained. 11/25/20  Yes Charlott Rakes, MD  hydrOXYzine (ATARAX/VISTARIL)  25 MG tablet Take 1 tablet (25 mg total) by mouth 3 (three) times daily as needed. 07/02/20  Yes Newlin, Charlane Ferretti, MD  ibuprofen (ADVIL) 600 MG tablet TAKE 1 TABLET BY MOUTH EVERY 8 HOURS AS NEEDED 04/17/21  Yes Newlin, Enobong, MD  irbesartan (AVAPRO) 150 MG tablet TAKE 1 TABLET DAILY. TO REPLACE LISINOPRIL. START TAKING 24 HOUR AFTER LAST LISINOPRIL DOSE. 05/12/21  Yes Skeet Latch, MD  Lancet Devices [facility]) lancets Use as instructed daily. 05/14/17  Yes Charlott Rakes, MD  methocarbamol (ROBAXIN) 750 MG tablet Take 1 tablet (750 mg total) by mouth every 8  (eight) hours as needed for muscle spasms. 07/02/20  Yes Charlott Rakes, MD  Multiple Vitamin (MULTI-VITAMIN DAILY) TABS Take 1 tablet by mouth daily as needed.   Yes [provider]  nitrofurantoin, macrocrystal-monohydrate, (MACROBID) 100 MG capsule Take 1 capsule (100 mg total) by mouth 2 (two) times daily. 06/27/21  Yes Feliberto Gottron, FNP  omeprazole (PRILOSEC) 40 MG capsule Take 1 tab every morning. 12/13/17  Yes Esterwood, Amy S, PA-C  predniSONE (DELTASONE) 20 MG tablet Take 20 mg by mouth daily as needed.   Yes [provider]  Vitamin D, Ergocalciferol, (DRISDOL) 1.25 MG (50000 UNIT) CAPS capsule TAKE 1 CAPSULE BY MOUTH ONE TIME PER WEEK 05/05/21  Yes Newlin, Enobong, MD  amLODipine (NORVASC) 10 MG tablet Take 1 tablet (10 mg total) by mouth daily. 02/03/21 05/04/21  Skeet Latch, MD     Objective:  Jasmine December:   Vitals:   06/27/21 0849  Weight: 190 lb (86.2 kg)  Height: 5' 3.5" (1.613 m)   Physical examination was deferred due to the nature of the visit.   Data Review Lab Results  Component Value Date   HGBA1C 7.3 (H) 06/04/2021   HGBA1C 6.8 11/25/2020   HGBA1C 6.8 07/02/2020     Assessment & Plan   1. Acute cystitis without hematuria -Macrobid 100 mg take 1 tablet by mouth twice a day for 7 days -Increase fluid intake -Take medications as prescribed -Come to the lab for urine test - POCT URINALYSIS DIP (CLINITEK) - Urine Culture -Reports new or worsening symptoms to the clinic or call 911 as needed.  Verbalized understanding.    Patient have been counseled extensively about nutrition and exercise  No follow-ups on file.  The patient was given clear instructions to go to ER or return to medical center if symptoms don't improve, worsen or new problems develop. The patient verbalized understanding. The patient was told to call to get lab results if they haven't heard anything in the next week.     Feliberto Gottron, APRN, FNP-C [facility] and [facility] [place], [place]   06/27/2021, 9:33 AM

## 2021-06-27 NOTE — Progress Notes (Signed)
Burning and urgency x 1 week dark orange urine.

## 2021-06-27 NOTE — Telephone Encounter (Signed)
Message from Lennox Solders sent at 06/27/2021  7:24 AM EDT  Pt has been having uti symptoms for about a week. Pt is having burning when urinating and frequency and orangish urine color. Pt would like an appt today no openings     Call History   Type Contact Phone/Fax User  06/27/2021 07:21 AM EDT Phone (Incoming) Amber Rose, Amber Rose (Self) [phone] Lemmie Evens) Andrena Mews J  Pt. Started having burning,urinary frequency last week. Also has orange colored urine. Warm transfer to [facility] in the practice for appointment. Answer Assessment - Initial Assessment Questions 1. SYMPTOM: "What's the main symptom you're concerned about?" (e.g., frequency, incontinence)     Burning 2. ONSET: "When did the  symptoms  start?"     Last week 3. PAIN: "Is there any pain?" If Yes, ask: "How bad is it?" (Scale: 1-10; mild, moderate, severe)     Yes 4. CAUSE: "What do you think is causing the symptoms?"     UTI 5. OTHER SYMPTOMS: "Do you have any other symptoms?" (e.g., fever, flank pain, blood in urine, pain with urination)     Orange urine 6. PREGNANCY: "Is there any chance you are pregnant?" "When was your last menstrual period?"     No  Protocols used: Urinary Symptoms-A-AH

## 2021-06-27 NOTE — Patient Instructions (Signed)
-  Macrobid 100 mg take 1 tablet by mouth twice a day for 7 days -Increase fluid intake -Take medications as prescribed -Come to the lab for urine test - POCT URINALYSIS DIP (CLINITEK) - Urine Culture -Reports new or worsening symptoms to the clinic or call 911 as needed.  Verbalized understanding.

## 2021-07-03 LAB — URINE CULTURE

## 2021-07-21 ENCOUNTER — Other Ambulatory Visit: Payer: Self-pay | Admitting: Family Medicine

## 2021-07-21 DIAGNOSIS — Z1231 Encounter for screening mammogram for malignant neoplasm of breast: Secondary | ICD-10-CM

## 2021-07-24 DIAGNOSIS — M1611 Unilateral primary osteoarthritis, right hip: Secondary | ICD-10-CM | POA: Diagnosis not present

## 2021-07-24 DIAGNOSIS — M1612 Unilateral primary osteoarthritis, left hip: Secondary | ICD-10-CM | POA: Diagnosis not present

## 2021-08-09 ENCOUNTER — Other Ambulatory Visit: Payer: Self-pay | Admitting: Family Medicine

## 2021-08-09 NOTE — Telephone Encounter (Signed)
Requested medication (s) are due for refill today: yes  Requested medication (s) are on the active medication list: yes  Last refill:  05/05/21   #4  Future visit scheduled: no  Notes to clinic:  med not delegated to NT to RF  50,000 IU strengths are not delegated   Phosphate in normal range and within 360 days   Vitamin D in normal range and within 360 days    Requested Prescriptions  Pending Prescriptions Disp Refills   Vitamin D, Ergocalciferol, (DRISDOL) 1.25 MG (50000 UNIT) CAPS capsule [Pharmacy Med Name: VITAMIN D2 1.25MG (50,000 UNIT)] 4 capsule 0    Sig: TAKE 1 CAPSULE BY MOUTH ONE TIME PER WEEK     Endocrinology:  Vitamins - Vitamin D Supplementation Failed - 08/09/2021  5:35 PM      Failed - 50,000 IU strengths are not delegated      Failed - Phosphate in normal range and within 360 days    No results found for: PHOS        Failed - Vitamin D in normal range and within 360 days    Vit D, 25-Hydroxy  Date Value Ref Range Status  07/02/2020 14.9 (L) 30.0 - 100.0 ng/mL Final    Comment:    Vitamin D deficiency has been defined by the Institute of Medicine and an Endocrine Society practice guideline as a level of serum 25-OH vitamin D less than 20 ng/mL (1,2). The Endocrine Society went on to further define vitamin D insufficiency as a level between 21 and 29 ng/mL (2). 1. IOM (Institute of Medicine). 2010. Dietary reference    intakes for calcium and D. [place]: The    [employer]. 2. Holick MF, Binkley [place], Bischoff-Ferrari HA, et al.    Evaluation, treatment, and prevention of vitamin D    deficiency: an Endocrine Society clinical practice    guideline. JCEM. 2011 Jul; 96(7):1911-30.           Passed - Ca in normal range and within 360 days    Calcium  Date Value Ref Range Status  12/12/2020 9.4 8.7 - 10.2 mg/dL Final          Passed - Valid encounter within last 12 months    Recent Outpatient Visits           1 month ago Acute  cystitis without hematuria   [place], Broadus John, FNP   2 months ago Type 2 diabetes mellitus with other neurologic complication, without long-term current use of insulin ([place])   [place], Enobong, MD   4 months ago Medicare annual wellness visit, subsequent   [place], Carroll Sage, FNP   8 months ago Type 2 diabetes mellitus with other neurologic complication, without long-term current use of insulin ([place])   [place], Enobong, MD   9 months ago Fibromyalgia   [facility] Charlott Rakes, MD

## 2021-08-14 ENCOUNTER — Other Ambulatory Visit: Payer: Self-pay

## 2021-08-15 ENCOUNTER — Encounter: Payer: Self-pay | Admitting: Family Medicine

## 2021-08-17 ENCOUNTER — Other Ambulatory Visit: Payer: Self-pay | Admitting: Family Medicine

## 2021-08-17 DIAGNOSIS — G8929 Other chronic pain: Secondary | ICD-10-CM

## 2021-08-19 ENCOUNTER — Other Ambulatory Visit: Payer: Self-pay | Admitting: Family Medicine

## 2021-08-19 ENCOUNTER — Encounter: Payer: Self-pay | Admitting: Family Medicine

## 2021-08-19 NOTE — Telephone Encounter (Signed)
Requested medication (s) are due for refill today: no  Requested medication (s) are on the active medication list: yes  Last refill:  07/26/21 #4  Future visit scheduled: no  Notes to clinic:  med not delegated to NT to refill or refuse this med Called [facility] on file. [place] (pharmacist) stated to disregard request   Requested Prescriptions  Pending Prescriptions Disp Refills   Vitamin D, Ergocalciferol, (DRISDOL) 1.25 MG (50000 UNIT) CAPS capsule [Pharmacy Med Name: VITAMIN D2 1.25MG (50,000 UNIT)] 4 capsule 0    Sig: TAKE 1 CAPSULE BY MOUTH ONE TIME PER WEEK     Endocrinology:  Vitamins - Vitamin D Supplementation Failed - 08/19/2021 10:31 AM      Failed - 50,000 IU strengths are not delegated      Failed - Phosphate in normal range and within 360 days    No results found for: PHOS        Failed - Vitamin D in normal range and within 360 days    Vit D, 25-Hydroxy  Date Value Ref Range Status  07/02/2020 14.9 (L) 30.0 - 100.0 ng/mL Final    Comment:    Vitamin D deficiency has been defined by the Institute of Medicine and an Endocrine Society practice guideline as a level of serum 25-OH vitamin D less than 20 ng/mL (1,2). The Endocrine Society went on to further define vitamin D insufficiency as a level between 21 and 29 ng/mL (2). 1. IOM (Institute of Medicine). 2010. Dietary reference    intakes for calcium and D. [place]: The    [employer]. 2. Holick MF, Binkley [place], Bischoff-Ferrari HA, et al.    Evaluation, treatment, and prevention of vitamin D    deficiency: an Endocrine Society clinical practice    guideline. JCEM. 2011 Jul; 96(7):1911-30.           Passed - Ca in normal range and within 360 days    Calcium  Date Value Ref Range Status  12/12/2020 9.4 8.7 - 10.2 mg/dL Final          Passed - Valid encounter within last 12 months    Recent Outpatient Visits           1 month ago Acute cystitis without hematuria   [place], Broadus John, FNP   2 months ago Type 2 diabetes mellitus with other neurologic complication, without long-term current use of insulin ([place])   [place], Enobong, MD   4 months ago Medicare annual wellness visit, subsequent   [place], Carroll Sage, FNP   8 months ago Type 2 diabetes mellitus with other neurologic complication, without long-term current use of insulin ([place])   [place], Enobong, MD   9 months ago Fibromyalgia   [facility] Charlott Rakes, MD

## 2021-08-20 ENCOUNTER — Other Ambulatory Visit: Payer: Self-pay | Admitting: Pharmacist

## 2021-08-20 ENCOUNTER — Other Ambulatory Visit: Payer: Self-pay

## 2021-08-20 MED ORDER — BYDUREON BCISE 2 MG/0.85ML ~~LOC~~ AUIJ: 2.0000 mg | AUTO-INJECTOR | SUBCUTANEOUS | 2 refills | Status: DC

## 2021-08-21 ENCOUNTER — Telehealth: Payer: Self-pay | Admitting: Pharmacist

## 2021-08-21 ENCOUNTER — Other Ambulatory Visit: Payer: Self-pay

## 2021-08-21 NOTE — Telephone Encounter (Signed)
Kelly,   Pt reached out to Dr. Margarita Rana about financial assistance with Bydureon. We sent this to [employer] on 08/20/2021. Do they coordinate patient assistance for her?

## 2021-08-22 ENCOUNTER — Other Ambulatory Visit: Payer: Self-pay

## 2021-08-22 ENCOUNTER — Ambulatory Visit
Admission: RE | Admit: 2021-08-22 | Discharge: 2021-08-22 | Disposition: A | Discharge status: Home / Self Care | Payer: Medicare HMO | Source: Ambulatory Visit | Attending: Family Medicine | Admitting: Family Medicine

## 2021-08-22 DIAGNOSIS — Z1231 Encounter for screening mammogram for malignant neoplasm of breast: Secondary | ICD-10-CM | POA: Diagnosis not present

## 2021-08-24 ENCOUNTER — Other Ambulatory Visit: Payer: Self-pay | Admitting: Family Medicine

## 2021-08-24 DIAGNOSIS — Z8619 Personal history of other infectious and parasitic diseases: Secondary | ICD-10-CM

## 2021-08-24 NOTE — Telephone Encounter (Signed)
Requested Prescriptions  Pending Prescriptions Disp Refills  . acyclovir (ZOVIRAX) 400 MG tablet [Pharmacy Med Name: ACYCLOVIR 400 MG TABLET] 180 tablet 0    Sig: TAKE 1 TABLET BY MOUTH TWICE A DAY     Antimicrobials:  Antiviral Agents - Anti-Herpetic Passed - 08/24/2021  4:21 PM      Passed - Valid encounter within last 12 months    Recent Outpatient Visits          1 month ago Acute cystitis without hematuria   [place], Broadus John, FNP   2 months ago Type 2 diabetes mellitus with other neurologic complication, without long-term current use of insulin ([place])   [place], Enobong, MD   4 months ago Medicare annual wellness visit, subsequent   [place], Carroll Sage, FNP   9 months ago Type 2 diabetes mellitus with other neurologic complication, without long-term current use of insulin ([place])   [place], Enobong, MD   9 months ago Fibromyalgia   [facility] Charlott Rakes, MD

## 2021-08-26 ENCOUNTER — Other Ambulatory Visit: Payer: Self-pay

## 2021-08-26 ENCOUNTER — Other Ambulatory Visit: Payer: Self-pay | Admitting: Pharmacist

## 2021-08-26 MED ORDER — BYDUREON BCISE 2 MG/0.85ML ~~LOC~~ AUIJ
2.0000 mg | AUTO-INJECTOR | SUBCUTANEOUS | 2 refills | Status: DC
  Filled 2021-08-26: qty 3.4, 28d supply, fill #0

## 2021-08-27 ENCOUNTER — Encounter: Payer: Self-pay | Admitting: Family Medicine

## 2021-08-28 DIAGNOSIS — M9901 Segmental and somatic dysfunction of cervical region: Secondary | ICD-10-CM | POA: Diagnosis not present

## 2021-08-28 DIAGNOSIS — M25551 Pain in right hip: Secondary | ICD-10-CM | POA: Diagnosis not present

## 2021-08-28 DIAGNOSIS — M9902 Segmental and somatic dysfunction of thoracic region: Secondary | ICD-10-CM | POA: Diagnosis not present

## 2021-08-28 DIAGNOSIS — M9904 Segmental and somatic dysfunction of sacral region: Secondary | ICD-10-CM | POA: Diagnosis not present

## 2021-08-28 DIAGNOSIS — M9903 Segmental and somatic dysfunction of lumbar region: Secondary | ICD-10-CM | POA: Diagnosis not present

## 2021-08-28 DIAGNOSIS — M4727 Other spondylosis with radiculopathy, lumbosacral region: Secondary | ICD-10-CM | POA: Diagnosis not present

## 2021-09-01 ENCOUNTER — Encounter: Payer: Self-pay | Admitting: Pharmacist

## 2021-09-01 DIAGNOSIS — E785 Hyperlipidemia, unspecified: Secondary | ICD-10-CM

## 2021-09-01 DIAGNOSIS — E7800 Pure hypercholesterolemia, unspecified: Secondary | ICD-10-CM

## 2021-09-01 MED ORDER — PRALUENT 150 MG/ML ~~LOC~~ SOAJ: 150.0000 mg | SUBCUTANEOUS | 11 refills | Status: DC

## 2021-09-03 DIAGNOSIS — M9903 Segmental and somatic dysfunction of lumbar region: Secondary | ICD-10-CM | POA: Diagnosis not present

## 2021-09-03 DIAGNOSIS — M9901 Segmental and somatic dysfunction of cervical region: Secondary | ICD-10-CM | POA: Diagnosis not present

## 2021-09-03 DIAGNOSIS — M25551 Pain in right hip: Secondary | ICD-10-CM | POA: Diagnosis not present

## 2021-09-03 DIAGNOSIS — M4727 Other spondylosis with radiculopathy, lumbosacral region: Secondary | ICD-10-CM | POA: Diagnosis not present

## 2021-09-03 DIAGNOSIS — M9904 Segmental and somatic dysfunction of sacral region: Secondary | ICD-10-CM | POA: Diagnosis not present

## 2021-09-03 DIAGNOSIS — M9902 Segmental and somatic dysfunction of thoracic region: Secondary | ICD-10-CM | POA: Diagnosis not present

## 2021-09-08 ENCOUNTER — Other Ambulatory Visit: Payer: Self-pay

## 2021-09-08 ENCOUNTER — Ambulatory Visit: Payer: Medicare HMO | Admitting: Family Medicine

## 2021-09-10 DIAGNOSIS — M9902 Segmental and somatic dysfunction of thoracic region: Secondary | ICD-10-CM | POA: Diagnosis not present

## 2021-09-10 DIAGNOSIS — M9903 Segmental and somatic dysfunction of lumbar region: Secondary | ICD-10-CM | POA: Diagnosis not present

## 2021-09-10 DIAGNOSIS — M9901 Segmental and somatic dysfunction of cervical region: Secondary | ICD-10-CM | POA: Diagnosis not present

## 2021-09-10 DIAGNOSIS — M4727 Other spondylosis with radiculopathy, lumbosacral region: Secondary | ICD-10-CM | POA: Diagnosis not present

## 2021-09-10 DIAGNOSIS — M9904 Segmental and somatic dysfunction of sacral region: Secondary | ICD-10-CM | POA: Diagnosis not present

## 2021-09-10 DIAGNOSIS — M25551 Pain in right hip: Secondary | ICD-10-CM | POA: Diagnosis not present

## 2021-09-15 DIAGNOSIS — M25551 Pain in right hip: Secondary | ICD-10-CM | POA: Diagnosis not present

## 2021-09-15 DIAGNOSIS — M4727 Other spondylosis with radiculopathy, lumbosacral region: Secondary | ICD-10-CM | POA: Diagnosis not present

## 2021-09-15 DIAGNOSIS — M9904 Segmental and somatic dysfunction of sacral region: Secondary | ICD-10-CM | POA: Diagnosis not present

## 2021-09-15 DIAGNOSIS — M9903 Segmental and somatic dysfunction of lumbar region: Secondary | ICD-10-CM | POA: Diagnosis not present

## 2021-09-15 DIAGNOSIS — M9901 Segmental and somatic dysfunction of cervical region: Secondary | ICD-10-CM | POA: Diagnosis not present

## 2021-09-15 DIAGNOSIS — M9902 Segmental and somatic dysfunction of thoracic region: Secondary | ICD-10-CM | POA: Diagnosis not present

## 2021-09-17 ENCOUNTER — Encounter: Payer: Self-pay | Admitting: Family Medicine

## 2021-09-17 ENCOUNTER — Other Ambulatory Visit: Payer: Self-pay

## 2021-09-17 ENCOUNTER — Ambulatory Visit: Payer: Medicare HMO | Attending: Family Medicine | Admitting: Family Medicine

## 2021-09-17 ENCOUNTER — Other Ambulatory Visit: Payer: Self-pay | Admitting: Family Medicine

## 2021-09-17 ENCOUNTER — Other Ambulatory Visit (HOSPITAL_COMMUNITY)
Admission: RE | Admit: 2021-09-17 | Discharge: 2021-09-17 | Disposition: A | Discharge status: Home / Self Care | Payer: Medicare HMO | Source: Ambulatory Visit | Attending: Family Medicine | Admitting: Family Medicine

## 2021-09-17 VITALS — BP 125/84 | HR 85 | Ht 63.5 in | Wt 192.8 lb

## 2021-09-17 DIAGNOSIS — I152 Hypertension secondary to endocrine disorders: Secondary | ICD-10-CM

## 2021-09-17 DIAGNOSIS — E1169 Type 2 diabetes mellitus with other specified complication: Secondary | ICD-10-CM

## 2021-09-17 DIAGNOSIS — M25559 Pain in unspecified hip: Secondary | ICD-10-CM

## 2021-09-17 DIAGNOSIS — N760 Acute vaginitis: Secondary | ICD-10-CM | POA: Diagnosis not present

## 2021-09-17 DIAGNOSIS — R202 Paresthesia of skin: Secondary | ICD-10-CM | POA: Diagnosis not present

## 2021-09-17 DIAGNOSIS — M79652 Pain in left thigh: Secondary | ICD-10-CM

## 2021-09-17 DIAGNOSIS — E1149 Type 2 diabetes mellitus with other diabetic neurological complication: Secondary | ICD-10-CM

## 2021-09-17 DIAGNOSIS — M79651 Pain in right thigh: Secondary | ICD-10-CM | POA: Diagnosis not present

## 2021-09-17 DIAGNOSIS — R300 Dysuria: Secondary | ICD-10-CM | POA: Diagnosis present

## 2021-09-17 DIAGNOSIS — E1159 Type 2 diabetes mellitus with other circulatory complications: Secondary | ICD-10-CM | POA: Diagnosis not present

## 2021-09-17 DIAGNOSIS — B3731 Acute candidiasis of vulva and vagina: Secondary | ICD-10-CM

## 2021-09-17 DIAGNOSIS — E785 Hyperlipidemia, unspecified: Secondary | ICD-10-CM

## 2021-09-17 DIAGNOSIS — B9689 Other specified bacterial agents as the cause of diseases classified elsewhere: Secondary | ICD-10-CM

## 2021-09-17 LAB — POCT URINALYSIS DIP (CLINITEK)
Bilirubin, UA: NEGATIVE
Glucose, UA: NEGATIVE mg/dL
Ketones, POC UA: NEGATIVE mg/dL
Leukocytes, UA: NEGATIVE
Nitrite, UA: NEGATIVE
POC PROTEIN,UA: NEGATIVE
Spec Grav, UA: 1.01 (ref 1.010–1.025)
Urobilinogen, UA: 0.2 E.U./dL
pH, UA: 6 (ref 5.0–8.0)

## 2021-09-17 LAB — POCT GLYCOSYLATED HEMOGLOBIN (HGB A1C): HbA1c, POC (controlled diabetic range): 7.1 % — AB (ref 0.0–7.0)

## 2021-09-17 LAB — GLUCOSE, POCT (MANUAL RESULT ENTRY): POC Glucose: 121 mg/dl — AB (ref 70–99)

## 2021-09-17 MED ORDER — TRULICITY 3 MG/0.5ML ~~LOC~~ SOAJ: 3.0000 mg | SUBCUTANEOUS | 3 refills | Status: DC

## 2021-09-17 MED ORDER — OMEPRAZOLE 40 MG PO CPDR: DELAYED_RELEASE_CAPSULE | ORAL | 3 refills | Status: DC

## 2021-09-17 MED ORDER — TRULICITY 3 MG/0.5ML ~~LOC~~ SOAJ
3.0000 mg | SUBCUTANEOUS | 3 refills | Status: DC
  Filled 2021-09-17: qty 2, 28d supply, fill #0

## 2021-09-17 MED ORDER — METHOCARBAMOL 750 MG PO TABS: 750.0000 mg | ORAL_TABLET | Freq: Three times a day (TID) | ORAL | 1 refills | Status: DC | PRN

## 2021-09-17 MED ORDER — MELOXICAM 7.5 MG PO TABS: 7.5000 mg | ORAL_TABLET | Freq: Every day | ORAL | 1 refills | Status: DC

## 2021-09-17 MED ORDER — TRULICITY 1.5 MG/0.5ML ~~LOC~~ SOAJ: 1.5000 mg | SUBCUTANEOUS | 0 refills | Status: DC

## 2021-09-17 MED ORDER — BUPROPION HCL ER (SR) 150 MG PO TB12
150.0000 mg | ORAL_TABLET | Freq: Two times a day (BID) | ORAL | 3 refills | Status: DC
Start: 2021-09-17 — End: 2022-01-23

## 2021-09-17 MED ORDER — TRULICITY 1.5 MG/0.5ML ~~LOC~~ SOAJ
1.5000 mg | SUBCUTANEOUS | 0 refills | Status: DC
  Filled 2021-09-17 – 2021-09-24 (×4): qty 2, 28d supply, fill #0

## 2021-09-17 MED ORDER — FLUCONAZOLE 150 MG PO TABS: 150.0000 mg | ORAL_TABLET | Freq: Every day | ORAL | 4 refills | Status: DC | PRN

## 2021-09-17 MED ORDER — PREGABALIN 75 MG PO CAPS
75.0000 mg | ORAL_CAPSULE | Freq: Two times a day (BID) | ORAL | 3 refills | Status: DC
Start: 2021-09-17 — End: 2021-10-29

## 2021-09-17 MED ORDER — AMLODIPINE BESYLATE 10 MG PO TABS: 10.0000 mg | ORAL_TABLET | Freq: Every day | ORAL | 3 refills | Status: DC

## 2021-09-17 MED ORDER — CLINDAMYCIN HCL 300 MG PO CAPS: 300.0000 mg | ORAL_CAPSULE | Freq: Two times a day (BID) | ORAL | 0 refills | Status: DC

## 2021-09-17 NOTE — Progress Notes (Signed)
Subjective:  Patient ID: Amber Rose, female    DOB: [birth year]  Age: 45 y.o. MRN: [id number]  CC: Diabetes   HPI Elizah Lydon is a 45 y.o. year old female with a history of type 2 diabetes mellitus (A1c 7.1), hypertension, depression and anxiety, hydradenitis, tobacco abuse, fibromyalgia seen for a follow up visit.  Interval History: Prior to this visit she had called complaining of wanting to discontinue Farxiga due to recurrent yeast infections.  She has been compliant with her Bydureon. She has been having a lot of yeast infections and BV which has picked up in the last 3 months and it is unrelated to her cycles She states Metronidazole has been ineffective and causes nausea and she usually has to take clindamycin.  She would like to have an xray of her hips given she has ongoing legs   She has pain in her groin, hips which shoot down her legs.  This affects her gait.  Currently under the care of a chiropractor. Saw [facility] and one of the specialist recommended surgery while the others did not She would like to try Lyrica (in the past she did have problem with insurance covering this) Using Gabapentin which is ineffective and she uses Ibuprofen and has to take a lot.  Past Medical History:  Diagnosis Date   Allergy    Anxiety    Arthritis    Chronic UTI    Depression    Diabetes mellitus without complication (HCC)    Diverticulosis    Fibromyalgia    GERD (gastroesophageal reflux disease)    Hiatal hernia    Hyperlipidemia    Hypertension    Internal hemorrhoids    Meningitis, viral    Pancreatitis    Pure hypercholesterolemia 12/23/2020    Past Surgical History:  Procedure Laterality Date   APPENDECTOMY  1990   ceasarian  2002   CESAREAN SECTION     x1   CHOLECYSTECTOMY  2011   COLONOSCOPY     UPPER GASTROINTESTINAL ENDOSCOPY     URINARY SURGERY     urethra sling, removal, and then revision 2016 (4 surgeries)   WISDOM TOOTH  EXTRACTION      Family History  Problem Relation Age of Onset   Cancer Mother        cervical, breast, lungm skin, bladder-ex smoker   Hypertension Mother    Diabetes Father    Multiple sclerosis Paternal Grandmother    Heart attack Maternal Grandfather    Allergic rhinitis Neg Hx    Angioedema Neg Hx    Asthma Neg Hx    Eczema Neg Hx    Urticaria Neg Hx    Colon cancer Neg Hx    Esophageal cancer Neg Hx    Liver disease Neg Hx    Pancreatic cancer Neg Hx    Prostate cancer Neg Hx    Rectal cancer Neg Hx    Stomach cancer Neg Hx     Allergies  Allergen Reactions   Atorvastatin Other (See Comments)     Joint pain   Crestor [Rosuvastatin]     Joint pain    Metformin And Related Nausea And Vomiting   Metronidazole Nausea And Vomiting   Penicillins     childhood   Xanax [Alprazolam]     "Caused upper respiratory symptoms" per pt   Glyburide Other (See Comments)    "blood sugar dropped uncontrollably"   Linagliptin Diarrhea and Other (See Comments)    Stomach pain,  sinus infection   Moxifloxacin Diarrhea    Outpatient Medications Prior to Visit  Medication Sig Dispense Refill   acyclovir (ZOVIRAX) 400 MG tablet TAKE 1 TABLET BY MOUTH TWICE A DAY 180 tablet 0   albuterol (PROAIR HFA) 108 (90 Base) MCG/ACT inhaler Inhale 2 puffs into the lungs every 4 (four) hours as needed for wheezing or shortness of breath. 3 Inhaler 0   Alirocumab (PRALUENT) 150 MG/ML SOAJ Inject 150 mg into the skin every 14 (fourteen) days. 2 mL 11   Blood Glucose Monitoring Suppl (ACCU-CHEK AVIVA PLUS) w/Device KIT USE AS DIRECTED DAILY. E11.9 1 kit 0   hydrOXYzine (ATARAX/VISTARIL) 25 MG tablet Take 1 tablet (25 mg total) by mouth 3 (three) times daily as needed. 270 tablet 1   irbesartan (AVAPRO) 150 MG tablet TAKE 1 TABLET DAILY. TO REPLACE LISINOPRIL. START TAKING 24 HOUR AFTER LAST LISINOPRIL DOSE. 30 tablet 11   Lancet Devices (ACCU-CHEK SOFTCLIX) lancets Use as instructed daily. 1 each 5    Multiple Vitamin (MULTI-VITAMIN DAILY) TABS Take 1 tablet by mouth daily as needed.     nitrofurantoin, macrocrystal-monohydrate, (MACROBID) 100 MG capsule Take 1 capsule (100 mg total) by mouth 2 (two) times daily. 14 capsule 0   predniSONE (DELTASONE) 20 MG tablet Take 20 mg by mouth daily as needed.     Vitamin D, Ergocalciferol, (DRISDOL) 1.25 MG (50000 UNIT) CAPS capsule TAKE 1 CAPSULE BY MOUTH ONE TIME PER WEEK 4 capsule 0   buPROPion (WELLBUTRIN SR) 150 MG 12 hr tablet 1 tablet daily for 3 days and then twice a day for 12 weeks 30 tablet 1   dapagliflozin propanediol (FARXIGA) 5 MG TABS tablet Take 1 tablet (5 mg total) by mouth daily before breakfast. 30 tablet 3   Exenatide ER (BYDUREON BCISE) 2 MG/0.85ML AUIJ Inject 2 mg into the skin once a week. 3.4 mL 2   fluconazole (DIFLUCAN) 150 MG tablet Take 1 tablet (150 mg total) by mouth daily as needed. 2 tablet 4   gabapentin (NEURONTIN) 300 MG capsule Take 1 capsule (300 mg total) by mouth 3 (three) times daily. Discontinue once Lyrica is obtained. 90 capsule 3   ibuprofen (ADVIL) 600 MG tablet TAKE 1 TABLET BY MOUTH EVERY 8 HOURS AS NEEDED 60 tablet 1   methocarbamol (ROBAXIN) 750 MG tablet Take 1 tablet (750 mg total) by mouth every 8 (eight) hours as needed for muscle spasms. 180 tablet 1   omeprazole (PRILOSEC) 40 MG capsule Take 1 tab every morning. 90 capsule 3   amLODipine (NORVASC) 10 MG tablet Take 1 tablet (10 mg total) by mouth daily. 90 tablet 3   No facility-administered medications prior to visit.     ROS Review of Systems  Constitutional:  Negative for activity change, appetite change and fatigue.  HENT:  Negative for congestion, sinus pressure and sore throat.   Eyes:  Negative for visual disturbance.  Respiratory:  Negative for cough, chest tightness, shortness of breath and wheezing.   Cardiovascular:  Negative for chest pain and palpitations.  Gastrointestinal:  Negative for abdominal distention, abdominal pain and  constipation.  Endocrine: Negative for polydipsia.  Genitourinary:  Negative for dysuria and frequency.  Musculoskeletal:        See HPI  Skin:  Negative for rash.  Neurological:  Negative for tremors, light-headedness and numbness.  Hematological:  Does not bruise/bleed easily.  Psychiatric/Behavioral:  Negative for agitation and behavioral problems.    Objective:  BP 125/84   Pulse 85  Ht 5' 3.5" (1.613 m)   Wt 192 lb 12.8 oz (87.5 kg)   SpO2 99%   BMI 33.62 kg/m   BP/Weight 09/17/2021 06/27/2021 1/61/0960  Systolic BP 454 - 098  Diastolic BP 84 - 80  Wt. (Lbs) 192.8 190 193.8  BMI 33.62 33.13 34.33      Physical Exam Constitutional:      Appearance: She is well-developed.  Cardiovascular:     Rate and Rhythm: Normal rate.     Heart sounds: Normal heart sounds. No murmur heard. Pulmonary:     Effort: Pulmonary effort is normal.     Breath sounds: Normal breath sounds. No wheezing or rales.  Chest:     Chest wall: No tenderness.  Abdominal:     General: Bowel sounds are normal. There is no distension.     Palpations: Abdomen is soft. There is no mass.     Tenderness: There is no abdominal tenderness.  Musculoskeletal:        General: Normal range of motion.     Right lower leg: No edema.     Left lower leg: No edema.     Comments: Tenderness in hip joint on range of motion  Neurological:     Mental Status: She is alert and oriented to person, place, and time.  Psychiatric:        Mood and Affect: Mood normal.    CMP Latest Ref Rng & Units 12/12/2020 11/26/2020 07/02/2020  Glucose 65 - 99 mg/dL 101(H) 142(H) 158(H)  BUN 6 - 24 mg/dL _0 Creatinine 0.57 - 1.00 mg/dL 0.61 0.72 0.70  Sodium 134 - 144 mmol/L 138 139 141  Potassium 3.5 - 5.2 mmol/L 4.3 4.9 3.9  Chloride 96 - 106 mmol/L 98 106 106  CO2 20 - 29 mmol/L _1 Calcium 8.7 - 10.2 mg/dL 9.4 8.9 8.9  Total Protein 6.0 - 8.5 g/dL - 6.3 6.4  Total Bilirubin 0.0 - 1.2 mg/dL - <0.2 <0.2  Alkaline  Phos 44 - 121 IU/L - 83 105  AST 0 - 40 IU/L - 13 9  ALT 0 - 32 IU/L - 15 9    Lipid Panel     Component Value Date/Time   CHOL 244 (H) 11/26/2020 0945   TRIG 94 11/26/2020 0945   HDL 59 11/26/2020 0945   CHOLHDL 4.1 11/26/2020 0945   CHOLHDL 4.4 10/28/2016 0851   LDLCALC 169 (H) 11/26/2020 0945    CBC    Component Value Date/Time   WBC 13.8 (H) 06/01/2019 1532   WBC 9.4 10/12/2016 0517   RBC 4.57 06/01/2019 1532   RBC 4.10 10/12/2016 0517   HGB 11.8 06/01/2019 1532   HCT 37.8 06/01/2019 1532   PLT 413 06/01/2019 1532   MCV 83 06/01/2019 1532   MCH 25.8 (L) 06/01/2019 1532   MCH 28.0 10/12/2016 0517   MCHC 31.2 (L) 06/01/2019 1532   MCHC 33.1 10/12/2016 0517   RDW 17.6 (H) 06/01/2019 1532   LYMPHSABS 2.8 06/01/2019 1532   MONOABS 0.6 10/12/2016 0517   EOSABS 0.1 06/01/2019 1532   BASOSABS 0.1 06/01/2019 1532    Lab Results  Component Value Date   HGBA1C 7.1 (A) 09/17/2021    Assessment & Plan:  1. Type 2 diabetes mellitus with other neurologic complication, without long-term current use of insulin (HCC) Controlled with A1c of 7.1 She has been without Farxiga due to concerns about recurrent yeast infections Switched from [employer] to [employer] which will  provide more room with regards to upward titration Counseled on Diabetic diet, my plate method, 836 minutes of moderate intensity exercise/week Blood sugar logs with fasting goals of 80-120 mg/dl, random of less than 180 and in the event of sugars less than 60 mg/dl or greater than 400 mg/dl encouraged to notify the clinic. Advised on the need for annual eye exams, annual foot exams, Pneumonia vaccine. - POCT glucose (manual entry) - POCT glycosylated hemoglobin (Hb O2H) - Basic Metabolic Panel  2. Burning with urination UA negative for UTI - POCT URINALYSIS DIP (CLINITEK) - Cervicovaginal ancillary only  3. Pain in both thighs Uncontrolled Switched from gabapentin to Lyrica Switched from ibuprofen to  meloxicam Will order pelvic x-rays - pregabalin (LYRICA) 75 MG capsule; Take 1 capsule (75 mg total) by mouth 2 (two) times daily.  Dispense: 30 capsule; Refill: 3 - meloxicam (MOBIC) 7.5 MG tablet; Take 1 tablet (7.5 mg total) by mouth daily.  Dispense: 30 tablet; Refill: 1 - methocarbamol (ROBAXIN) 750 MG tablet; Take 1 tablet (750 mg total) by mouth every 8 (eight) hours as needed for muscle spasms.  Dispense: 180 tablet; Refill: 1 - DG Pelvis 1-2 Views; Future - Ambulatory referral to Physical Therapy  4. Hip pain See #3 above Followed by orthopedic - pregabalin (LYRICA) 75 MG capsule; Take 1 capsule (75 mg total) by mouth 2 (two) times daily.  Dispense: 30 capsule; Refill: 3 - meloxicam (MOBIC) 7.5 MG tablet; Take 1 tablet (7.5 mg total) by mouth daily.  Dispense: 30 tablet; Refill: 1 - DG Pelvis 1-2 Views; Future - Ambulatory referral to Physical Therapy  5. Vaginal candidiasis Diflucan has been refilled  6. Paresthesias She does have underlying fibromyalgia and possibly diabetic neuropathy Due to request for Lyrica have discontinued gabapentin - pregabalin (LYRICA) 75 MG capsule; Take 1 capsule (75 mg total) by mouth 2 (two) times daily.  Dispense: 30 capsule; Refill: 3  7. Bacterial vaginosis Unable to tolerate metronidazole - clindamycin (CLEOCIN) 300 MG capsule; Take 1 capsule (300 mg total) by mouth in the morning and at bedtime.  Dispense: 14 capsule; Refill: 0  8. Hyperlipidemia associated with type 2 diabetes mellitus ([place]) Uncontrolled Unable to tolerate statin due to myalgias She was placed on Repatha by cardiology but has not been adherent with it due to cost Advised to speak with the cardiology clinic regarding this as she finds her coupon has been ineffective  9. Hypertension associated with diabetes ([place]) Controlled Counseled on blood pressure goal of less than 130/80, low-sodium, DASH diet, medication compliance, 150 minutes of moderate intensity exercise per  week. Discussed medication compliance, adverse effects. - amLODipine (NORVASC) 10 MG tablet; Take 1 tablet (10 mg total) by mouth daily.  Dispense: 90 tablet; Refill: 3   Meds ordered this encounter  Medications   DISCONTD: Dulaglutide (TRULICITY) 1.5 UT/6.5YY SOPN    Sig: Inject 1.5 mg into the skin once a week. For 1 month then increase to 3.0 mg once a week thereafter    Dispense:  2 mL    Refill:  0    Discontinue Bydureon   DISCONTD: Dulaglutide (TRULICITY) 3 TK/3.5WS SOPN    Sig: Inject 3 mg as directed once a week.    Dispense:  2 mL    Refill:  3   pregabalin (LYRICA) 75 MG capsule    Sig: Take 1 capsule (75 mg total) by mouth 2 (two) times daily.    Dispense:  30 capsule    Refill:  3  Discontinue gabapentin   meloxicam (MOBIC) 7.5 MG tablet    Sig: Take 1 tablet (7.5 mg total) by mouth daily.    Dispense:  30 tablet    Refill:  1   clindamycin (CLEOCIN) 300 MG capsule    Sig: Take 1 capsule (300 mg total) by mouth in the morning and at bedtime.    Dispense:  14 capsule    Refill:  0   omeprazole (PRILOSEC) 40 MG capsule    Sig: Take 1 tab every morning.    Dispense:  90 capsule    Refill:  3   amLODipine (NORVASC) 10 MG tablet    Sig: Take 1 tablet (10 mg total) by mouth daily.    Dispense:  90 tablet    Refill:  3   buPROPion (WELLBUTRIN SR) 150 MG 12 hr tablet    Sig: Take 1 tablet (150 mg total) by mouth 2 (two) times daily. 1 tablet daily for 3 days and then twice a day for 12 weeks    Dispense:  60 tablet    Refill:  3   methocarbamol (ROBAXIN) 750 MG tablet    Sig: Take 1 tablet (750 mg total) by mouth every 8 (eight) hours as needed for muscle spasms.    Dispense:  180 tablet    Refill:  1   fluconazole (DIFLUCAN) 150 MG tablet    Sig: Take 1 tablet (150 mg total) by mouth daily as needed.    Dispense:  2 tablet    Refill:  4   Dulaglutide (TRULICITY) 1.5 BX/0.3YB SOPN    Sig: Inject 1.5 mg into the skin once a week. For 1 month then increase to  3.0 mg once a week thereafter    Dispense:  2 mL    Refill:  0    Discontinue Bydureon   Dulaglutide (TRULICITY) 3 FX/8.3AN SOPN    Sig: Inject 3 mg as directed once a week.    Dispense:  2 mL    Refill:  3    49 minutes of total face to face time spent including median intraservice time reviewing previous notes and test results, counseling patient on management options for her diabetes mellitus, addressing her chronic hip and thigh condition in addition to management of chronic medical conditions.Time also spent ordering medications, investigations and documenting in the chart.  All questions were answered to the patient's satisfaction   Follow-up: Return in about 3 months (around 12/18/2021) for Chronic medical conditions.       Charlott Rakes, MD, FAAFP. [facility] and [place], [place]   09/17/2021, 12:52 PM

## 2021-09-17 NOTE — Telephone Encounter (Signed)
Requested medication (s) are due for refill today - no  Requested medication (s) are on the active medication list -no  Future visit scheduled -no  Last refill: 09/17/21  Notes to clinic: Pharmacy is requesting alternative Rx- non delegated Rx  Requested Prescriptions  Pending Prescriptions Disp Refills   tiZANidine (ZANAFLEX) 4 MG tablet [Pharmacy Med Name: TIZANIDINE HCL 4 MG TABLET]  0     Not Delegated - Cardiovascular:  Alpha-2 Agonists - tizanidine Failed - 09/17/2021 11:00 AM      Failed - This refill cannot be delegated      Passed - Valid encounter within last 6 months    Recent Outpatient Visits           Today Type 2 diabetes mellitus with other neurologic complication, without long-term current use of insulin ([place])   [place], [place], MD   2 months ago Acute cystitis without hematuria   [place], Broadus John, FNP   3 months ago Type 2 diabetes mellitus with other neurologic complication, without long-term current use of insulin ([place])   [place], Enobong, MD   5 months ago Medicare annual wellness visit, subsequent   [place], Carroll Sage, FNP   9 months ago Type 2 diabetes mellitus with other neurologic complication, without long-term current use of insulin ([place])   [place], Enobong, MD                 Requested Prescriptions  Pending Prescriptions Disp Refills   tiZANidine (ZANAFLEX) 4 MG tablet [Pharmacy Med Name: TIZANIDINE HCL 4 MG TABLET]  0     Not Delegated - Cardiovascular:  Alpha-2 Agonists - tizanidine Failed - 09/17/2021 11:00 AM      Failed - This refill cannot be delegated      Passed - Valid encounter within last 6 months    Recent Outpatient Visits           Today Type 2 diabetes mellitus with other neurologic complication, without long-term  current use of insulin ([place])   [place], [place], MD   2 months ago Acute cystitis without hematuria   [place], Broadus John, FNP   3 months ago Type 2 diabetes mellitus with other neurologic complication, without long-term current use of insulin ([place])   [place], Enobong, MD   5 months ago Medicare annual wellness visit, subsequent   [place], Carroll Sage, FNP   9 months ago Type 2 diabetes mellitus with other neurologic complication, without long-term current use of insulin [facility])   [place] [facility] Charlott Rakes, MD

## 2021-09-17 NOTE — Patient Instructions (Signed)
Dulaglutide Injection What is this medication? DULAGLUTIDE (DOO la GLOO tide) treats type 2 diabetes. It works by increasing insulin levels in your body, which decreases your blood sugar (glucose). It also reduces the amount of sugar released into your blood and slows down your digestion. It can also be used to lower the risk of heart attack and stroke in people with type 2 diabetes. Changes to diet and exercise are often combined with this medication. This medicine may be used for other purposes; ask your health care provider or pharmacist if you have questions. COMMON BRAND NAME(S): Trulicity What should I tell my care team before I take this medication? They need to know if you have any of these conditions: Endocrine tumors (MEN 2) or if someone in your family had these tumors Eye disease, vision problems History of pancreatitis Kidney disease Liver disease Stomach or intestine problems Thyroid cancer or if someone in your family had thyroid cancer An unusual or allergic reaction to dulaglutide, other medications, foods, dyes, or preservatives Pregnant or trying to get pregnant Breast-feeding How should I use this medication? This medication is injected under the skin. You will be taught how to prepare and give it. Take it as directed on the prescription label on the same day of each week. Do NOT prime the pen. Keep taking it unless your care team tells you to stop. If you use this medication with insulin, you should inject this medication and the insulin separately. Do not mix them together. Do not give the injections right next to each other. Change (rotate) injection sites with each injection. This medication comes with INSTRUCTIONS FOR USE. Ask your pharmacist for directions on how to use this medication. Read the information carefully. Talk to your pharmacist or care team if you have questions. It is important that you put your used needles and syringes in a special sharps container. Do  not put them in a trash can. If you do not have a sharps container, call your pharmacist or care team to get one. A special MedGuide will be given to you by the pharmacist with each prescription and refill. Be sure to read this information carefully each time. Talk to your care team about the use of this medication in children. Special care may be needed. Overdosage: If you think you have taken too much of this medicine contact a poison control center or emergency room at once. NOTE: This medicine is only for you. Do not share this medicine with others. What if I miss a dose? If you miss a dose, take it as soon as you can unless it is more than 3 days late. If it is more than 3 days late, skip the missed dose. Take the next dose at the normal time. What may interact with this medication? Other medications for diabetes Many medications may cause changes in blood sugar, these include: Alcohol containing beverages Antiviral medications for HIV or AIDS Aspirin and aspirin-like medications Certain medications for blood pressure, heart disease, irregular heart beat Chromium Diuretics Female hormones, such as estrogens or progestins, birth control pills Fenofibrate Gemfibrozil Isoniazid Lanreotide Female hormones or anabolic steroids MAOIs like Carbex, Eldepryl, Marplan, Nardil, and Parnate Medications for allergies, asthma, cold, or cough Medications for depression, anxiety, or psychotic disturbances Medications for weight loss Niacin Nicotine NSAIDs, medications for pain and inflammation, like ibuprofen or naproxen Octreotide Pasireotide Pentamidine Phenytoin Probenecid Quinolone antibiotics such as ciprofloxacin, levofloxacin, ofloxacin Some herbal dietary supplements Steroid medications such as prednisone or cortisone Sulfamethoxazole;   trimethoprim Thyroid hormones Some medications can hide the warning symptoms of low blood sugar (hypoglycemia). You may need to monitor your blood  sugar more closely if you are taking one of these medications. These include: Beta-blockers, often used for high blood pressure or heart problems (examples include atenolol, metoprolol, propranolol) Clonidine Guanethidine Reserpine This list may not describe all possible interactions. Give your health care provider a list of all the medicines, herbs, non-prescription drugs, or dietary supplements you use. Also tell them if you smoke, drink alcohol, or use illegal drugs. Some items may interact with your medicine. What should I watch for while using this medication? Visit your care team for regular checks on your progress. Check with your care team if you have severe diarrhea, nausea, and vomiting, or if you sweat a lot. The loss of too much body fluid may make it dangerous for you to take this medication. A test called the HbA1C (A1C) will be monitored. This is a simple blood test. It measures your blood sugar control over the last 2 to 3 months. You will receive this test every 3 to 6 months. Learn how to check your blood sugar. Learn the symptoms of low and high blood sugar and how to manage them. Always carry a quick-source of sugar with you in case you have symptoms of low blood sugar. Examples include hard sugar candy or glucose tablets. Make sure others know that you can choke if you eat or drink when you develop serious symptoms of low blood sugar, such as seizures or unconsciousness. Get medical help at once. Tell your care team if you have high blood sugar. You might need to change the dose of your medication. If you are sick or exercising more than usual, you may need to change the dose of your medication. Do not skip meals. Ask your care team if you should avoid alcohol. Many nonprescription cough and cold products contain sugar or alcohol. These can affect blood sugar. Pens should never be shared. Even if the needle is changed, sharing may result in passing of viruses like hepatitis or  HIV. Wear a medical ID bracelet or chain. Carry a card that describes your condition. List the medications and doses you take on the card. What side effects may I notice from receiving this medication? Side effects that you should report to your care team as soon as possible: Allergic reactions--skin rash, itching, hives, swelling of the face, lips, tongue, or throat Change in vision Dehydration--increased thirst, dry mouth, feeling faint or lightheaded, headache, dark yellow or brown urine Kidney injury--decrease in the amount of urine, swelling of the ankles, hands, or feet Pancreatitis--severe stomach pain that spreads to your back or gets worse after eating or when touched, fever, nausea, vomiting Thyroid cancer--new mass or lump in the neck, pain or trouble swallowing, trouble breathing, hoarseness Side effects that usually do not require medical attention (report to your care team if they continue or are bothersome): Diarrhea Loss of appetite Nausea Stomach pain Vomiting This list may not describe all possible side effects. Call your doctor for medical advice about side effects. You may report side effects to FDA at 1-800-FDA-1088. Where should I keep my medication? Keep out of the reach of children and pets. Refrigeration (preferred): Store unopened pens in a refrigerator between 2 and 8 degrees C (36 and 46 degrees F). Keep it in the original carton until you are ready to take it. Do not freeze or use if the medication has been frozen.  Protect from light. Get rid of any unused medication after the expiration date on the label. Room Temperature: The pen may be stored at room temperature below 30 degrees C (86 degrees F) for up to a total of 14 days if needed. Protect from light. Avoid exposure to extreme heat. If it is stored at room temperature, throw away any unused medication after 14 days or after it expires, whichever is first. To get rid of medications that are no longer needed or  have expired: Take the medication to a medication take-back program. Check with your pharmacy or law enforcement to find a location. If you cannot return the medication, ask your pharmacist or care team how to get rid of this medication safely. NOTE: This sheet is a summary. It may not cover all possible information. If you have questions about this medicine, talk to your doctor, pharmacist, or health care provider.  2022 Elsevier/Gold Standard (2021-01-16 00:00:00)

## 2021-09-17 NOTE — Progress Notes (Signed)
Think she has a UTI a lot of yeast and bv.

## 2021-09-18 LAB — BASIC METABOLIC PANEL
BUN/Creatinine Ratio: 10 (ref 9–23)
BUN: 6 mg/dL (ref 6–24)
CO2: 24 mmol/L (ref 20–29)
Calcium: 8.7 mg/dL (ref 8.7–10.2)
Chloride: 101 mmol/L (ref 96–106)
Creatinine, Ser: 0.59 mg/dL (ref 0.57–1.00)
Glucose: 97 mg/dL (ref 70–99)
Potassium: 3.8 mmol/L (ref 3.5–5.2)
Sodium: 137 mmol/L (ref 134–144)
eGFR: 113 mL/min/{1.73_m2} (ref >59)

## 2021-09-19 LAB — CERVICOVAGINAL ANCILLARY ONLY
Bacterial Vaginitis (gardnerella): POSITIVE — AB
Candida Glabrata: NEGATIVE
Candida Vaginitis: NEGATIVE
Chlamydia: NEGATIVE
Comment: NEGATIVE
Comment: NEGATIVE
Comment: NEGATIVE
Comment: NEGATIVE
Comment: NEGATIVE
Comment: NORMAL
Neisseria Gonorrhea: NEGATIVE
Trichomonas: NEGATIVE

## 2021-09-23 ENCOUNTER — Other Ambulatory Visit: Payer: Self-pay

## 2021-09-24 ENCOUNTER — Other Ambulatory Visit: Payer: Self-pay

## 2021-09-24 ENCOUNTER — Telehealth: Payer: Self-pay

## 2021-09-24 NOTE — Telephone Encounter (Signed)
PA for Pregabalin approved until 10/25/2021. Pharmacy and patient notified.  Patient asked when she would be receiving free Trulicity.  When asked whether or not she had submitted an application she stated she had not, she assumed that it would have been done when MD switched from [place].  I explained that we can help her with the application process as a courtesy, that Bydureon and Trulicity are through different patient assistance programs and have different application processes.  She would have to complete an application and the medication would not be received until the first of the year.  Same with Bydureon-she asked about refilling that until the Trulicity came in-we would not receive for 7-10 business days.  Patient agreed to transferring Trulicity to [facility] and charging to her account and making payments.  She will sign paperwork when she picks up the medication.

## 2021-09-26 ENCOUNTER — Telehealth: Payer: Self-pay | Admitting: Family Medicine

## 2021-09-26 DIAGNOSIS — R2232 Localized swelling, mass and lump, left upper limb: Secondary | ICD-10-CM | POA: Diagnosis not present

## 2021-09-26 NOTE — Telephone Encounter (Signed)
Copied from [place] [id number]. Topic: General - Other >> Sep 24, 2021 10:11 AM Parke Poisson wrote: Reason for CRM: Pt states that she was in a program that reduces the price of some of her medications.She would like to know if she can get Claiborne Billings to call her concerning  Dulaglutide (TRULICITY) 1.5 YB/7.1WH SOPN and pregabalin (LYRICA) 75 MG capsule

## 2021-09-29 ENCOUNTER — Encounter: Payer: Self-pay | Admitting: Family Medicine

## 2021-09-29 ENCOUNTER — Other Ambulatory Visit: Payer: Self-pay | Admitting: Family Medicine

## 2021-09-29 DIAGNOSIS — L819 Disorder of pigmentation, unspecified: Secondary | ICD-10-CM

## 2021-10-01 ENCOUNTER — Other Ambulatory Visit: Payer: Self-pay

## 2021-10-01 ENCOUNTER — Ambulatory Visit: Payer: Medicare HMO | Admitting: Rehabilitative and Restorative Service Providers"

## 2021-10-01 DIAGNOSIS — B078 Other viral warts: Secondary | ICD-10-CM | POA: Diagnosis not present

## 2021-10-01 DIAGNOSIS — D2239 Melanocytic nevi of other parts of face: Secondary | ICD-10-CM | POA: Diagnosis not present

## 2021-10-02 ENCOUNTER — Encounter: Payer: Self-pay | Admitting: Family Medicine

## 2021-10-02 ENCOUNTER — Ambulatory Visit: Payer: Medicare HMO | Attending: Family Medicine

## 2021-10-02 ENCOUNTER — Other Ambulatory Visit: Payer: Self-pay

## 2021-10-02 DIAGNOSIS — M6281 Muscle weakness (generalized): Secondary | ICD-10-CM | POA: Insufficient documentation

## 2021-10-02 DIAGNOSIS — M79651 Pain in right thigh: Secondary | ICD-10-CM | POA: Diagnosis not present

## 2021-10-02 DIAGNOSIS — R262 Difficulty in walking, not elsewhere classified: Secondary | ICD-10-CM | POA: Insufficient documentation

## 2021-10-02 DIAGNOSIS — M25551 Pain in right hip: Secondary | ICD-10-CM | POA: Diagnosis not present

## 2021-10-02 DIAGNOSIS — M79652 Pain in left thigh: Secondary | ICD-10-CM | POA: Insufficient documentation

## 2021-10-02 DIAGNOSIS — M25559 Pain in unspecified hip: Secondary | ICD-10-CM | POA: Diagnosis not present

## 2021-10-02 DIAGNOSIS — M25552 Pain in left hip: Secondary | ICD-10-CM | POA: Insufficient documentation

## 2021-10-02 NOTE — Therapy (Signed)
[place] @ [address] [place], [place], [address] Phone: [phone]   Fax:  [phone]  Physical Therapy Evaluation  Patient Details  Name: Amber Rose MRN: [id number] Date of Birth: 08-15-1976 Referring Provider (PT): Dr. Charlott Rakes   Encounter Date: 10/02/2021   PT End of Session - 10/02/21 1015     Visit Number 1    Date for PT Re-Evaluation 11/27/21    Authorization Type MC    Progress Note Due on Visit 10    PT Start Time 0848    PT Stop Time 0933    PT Time Calculation (min) 45 min    Activity Tolerance Patient tolerated treatment well    Behavior During Therapy [facility] for tasks assessed/performed             Past Medical History:  Diagnosis Date   Allergy    Anxiety    Arthritis    Chronic UTI    Depression    Diabetes mellitus without complication ([place])    Diverticulosis    Fibromyalgia    GERD (gastroesophageal reflux disease)    Hiatal hernia    Hyperlipidemia    Hypertension    Internal hemorrhoids    Meningitis, viral    Pancreatitis    Pure hypercholesterolemia 12/23/2020    Past Surgical History:  Procedure Laterality Date   APPENDECTOMY  1990   ceasarian  2002   CESAREAN SECTION     x1   CHOLECYSTECTOMY  2011   COLONOSCOPY     UPPER GASTROINTESTINAL ENDOSCOPY     URINARY SURGERY     urethra sling, removal, and then revision 2016 (4 surgeries)   WISDOM TOOTH EXTRACTION      There were no vitals filed for this visit.    Subjective Assessment - 10/02/21 0859     Subjective Patient is referred by MD for thigh and hip pain bilaterally.  She explains that she went to multiple MD's in the two states she lived and no one was able to diagnose the origin of her pain.  She had to change jobs and ran out of her statin meds. When she came off of her meds, she noticed a significant decrease in her pain but now she is having some residual hip pain and thigh pain.    Limitations  Walking;Standing    How long can you sit comfortably? 20 min    How long can you stand comfortably? 15 min    How long can you walk comfortably? 10 min    Patient Stated Goals To eliminate pain    Currently in Pain? Yes    Pain Score 5     Pain Location Leg    Pain Orientation Right;Left    Pain Descriptors / Indicators Aching;Nagging;Constant    Pain Type Chronic pain    Pain Onset More than a month ago    Pain Frequency Constant    Aggravating Factors  Standing, walking, prolonged sitting    Pain Relieving Factors lyrica, meloxicam                [facility] PT Assessment - 10/02/21 0001       Assessment   Medical Diagnosis Pain in both hips    Referring Provider (PT) Dr. Charlott Rakes    Onset Date/Surgical Date 10/26/17    Hand Dominance Right    Next MD Visit as needed    Prior Therapy yes at [facility]  Precautions   Precautions Fall      Restrictions   Weight Bearing Restrictions No      Balance Screen   Has the patient fallen in the past 6 months No    Has the patient had a decrease in activity level because of a fear of falling?  No    Is the patient reluctant to leave their home because of a fear of falling?  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                PT Education - 10/02/21 0941     Education Details Initiated ZOX:[id number] Code: [id number]    Person(s) Educated Patient    Methods Explanation;Demonstration;Verbal cues;Handout    Comprehension Verbalized understanding;Returned demonstration;Verbal cues required              PT Short Term Goals - 10/02/21 1231       PT SHORT TERM GOAL #1   Title Pt will be I with initial HEP    Time 4    Period Weeks    Status New    Target Date 10/30/21      PT SHORT TERM GOAL #2   Title Pt will report at least 25% improvement in getting her legs in and out of the car    Baseline she has to use her hands to help get her legs in and out of the car    Time 4    Period Weeks    Status New    Target Date 10/30/21               PT Long Term Goals - 10/02/21 1233       PT LONG TERM GOAL #1   Title Foto will 54    Baseline 36    Time 8    Period Weeks    Status New    Target Date 11/27/21      PT LONG TERM GOAL #2   Title Pt will demo improved bil LE strength >/= 4 to 4+/5 all major muscle groups to assist with walking safely.    Time 8    Period Weeks    Status New    Target Date 11/27/21      PT LONG TERM GOAL #3   Title Patient will be able to sit for at least 30 min without need to get up and move due to pain    Baseline 20  min    Time 8    Period Weeks    Status New    Target Date 11/27/21      PT LONG TERM GOAL #4   Title Patient will be able to negotiate steps in/out of her home with ease and safely    Time 8    Period Weeks    Status New    Target Date 11/27/21      PT LONG TERM GOAL #5   Title Pain no greater than 2/10 throughout bilateral LE's    Time 8    Period Weeks    Status New    Target Date 11/27/21                    Plan - 10/02/21 1017     Clinical Impression Statement Patient is a 45 y.o. female who is  referred for hip thigh and general leg pain due to long term use of statins.  She went to multiple MD's who were unable to diagnose her leg pain and weakness.  She inadvertantly had to stop her statin medication due to change in insurance when she realized her legs felt 80% better and she regained function.  She met with her MD locally and he suggests a different statin per her report.  But she is fearful of the pain returning and has declined the medication.  She presents with limited hip ROM and decreased flexibility in hamstrings, quads/hip flexors, and hip rotators.  Strength in left hip flexor and left quad 4-/5.  She has generally 4 to 4+/5 in all other LE strength tests.  Gait pattern includes short step length and narrow base of support.  She would benefit from LE strengthening, flexibility and hip ROM exercises to improve hip arthrokinematics and reduce pain.  Her goal is to eliminate pain and be able to return to her normal level of function.    Personal Factors and Comorbidities Comorbidity 1;Time since onset of injury/illness/exacerbation    Comorbidities diabetes, elevated cholesterol    Examination-Activity Limitations Lift;Squat;Stand;Stairs    Examination-Participation Restrictions Cleaning;Driving;Occupation;Yard Work;Laundry;Shop    Stability/Clinical Decision Making Evolving/Moderate complexity    Clinical Decision Making Moderate    Rehab Potential Good    PT Frequency 2x / week    PT Duration 8 weeks    PT Treatment/Interventions ADLs/Self Care Home Management;Aquatic Therapy;Traction;Moist Heat;Iontophoresis 23m/ml Dexamethasone;Electrical Stimulation;Cryotherapy;Ultrasound;DME Instruction;Gait training;Therapeutic exercise;Therapeutic activities;Functional mobility training;Stair training;Balance training;Neuromuscular re-education;Patient/family education;Manual techniques;Passive range of motion;Vestibular;Vasopneumatic Device;Taping;Energy conservation;Dry needling    PT Next  Visit Plan Review HEP, add hip strengthening    PT Home Exercise Plan Access Code: [email or web address] URL: [email or web address]  Date: 10/02/2021  Prepared by: JCandyce Churn   Exercises  Standing Hamstring Stretch on Chair - 2 x daily - 7 x weekly - 1 sets - 3 reps - 30 sec hold  Standing Quad Stretch with Table and Chair Support - 2 x daily - 7 x weekly - 1 sets - 3 reps - 30 sec hold  Supine Piriformis Stretch Pulling Heel to Hip - 2 x daily - 7 x weekly - 1 sets - 3 reps - 30 hold  Supine Hip External Rotation Stretch - 2 x daily - 7 x weekly - 1 sets - 3 reps - 30 hold  Supine Bilateral Hip Internal Rotation Stretch - 2 x daily - 7 x weekly - 1 sets - 3 reps - 30 hold    Consulted and Agree with Plan of Care Patient  Patient will benefit from skilled therapeutic intervention in order to improve the following deficits and impairments:  Abnormal gait, Decreased balance, Decreased endurance, Decreased mobility, Difficulty walking, Hypomobility, Increased muscle spasms, Decreased range of motion, Decreased activity tolerance, Decreased strength, Increased fascial restricitons, Impaired flexibility, Pain  Visit Diagnosis: Muscle weakness (generalized) - Plan: PT plan of care cert/re-cert  Pain in left hip - Plan: PT plan of care cert/re-cert  Pain in right hip - Plan: PT plan of care cert/re-cert  Difficulty in walking, not elsewhere classified - Plan: PT plan of care cert/re-cert     Problem List Patient Active Problem List   Diagnosis Date Noted   Episode of recurrent major depressive disorder ([place]) 04/09/2021   BMI 33.0-33.9,adult 04/09/2021   Pure hypercholesterolemia 12/23/2020   Menorrhagia with regular cycle 09/13/2020   Encounter for cervical Pap smear with pelvic exam 09/13/2020   History of herpes zoster 01/17/2018   Overactive bladder 12/28/2017   Recurrent infections 11/19/2017   Cyst of nasal cavity 11/19/2017   Angio-edema 11/19/2017   Abdominal  pain, epigastric 11/04/2017   Dysphagia 11/04/2017   Gastroesophageal reflux disease 07/12/2017   Degenerative disc disease at L5-S1 level 05/14/2017   Fibromyalgia 05/14/2017   ADD (attention deficit disorder) 05/14/2017   Urinary incontinence 04/05/2017   Thigh pain 02/02/2017   Anxiety and depression 02/02/2017   Tobacco abuse 02/02/2017   Facial flushing 02/02/2017   Hypertension 10/23/2016   Hidradenitis 10/23/2016   Type II diabetes mellitus, uncontrolled 09/12/2014   Lupus anticoagulant positive 07/03/2014   Vitamin D deficiency 06/06/2014   Benign essential hypertension 06/06/2014    Anderson [place] B. Wilder Kurowski, PT 12/08/221:47 PM   [place] @ [address] [place], [place], [address] Phone: [phone]   Fax:  [phone]  Name: Amber Rose MRN: [id number] Date of Birth: Mar 05, 1976

## 2021-10-02 NOTE — Patient Instructions (Signed)
Access Code: [email or web address] URL: [email or web address] Date: 10/02/2021 Prepared by: Candyce Churn  Exercises Standing Hamstring Stretch on Chair - 2 x daily - 7 x weekly - 1 sets - 3 reps - 30 sec hold Standing Quad Stretch with Table and Chair Support - 2 x daily - 7 x weekly - 1 sets - 3 reps - 30 sec hold Supine Piriformis Stretch Pulling Heel to Hip - 2 x daily - 7 x weekly - 1 sets - 3 reps - 30 hold Supine Hip External Rotation Stretch - 2 x daily - 7 x weekly - 1 sets - 3 reps - 30 hold Supine Bilateral Hip Internal Rotation Stretch - 2 x daily - 7 x weekly - 1 sets - 3 reps - 30 hold

## 2021-10-08 ENCOUNTER — Other Ambulatory Visit: Payer: Self-pay | Admitting: Family Medicine

## 2021-10-08 ENCOUNTER — Encounter: Payer: Self-pay | Admitting: Family Medicine

## 2021-10-08 DIAGNOSIS — N926 Irregular menstruation, unspecified: Secondary | ICD-10-CM

## 2021-10-14 ENCOUNTER — Other Ambulatory Visit: Payer: Self-pay

## 2021-10-14 ENCOUNTER — Other Ambulatory Visit: Payer: Self-pay | Admitting: Pharmacist

## 2021-10-14 MED ORDER — TRULICITY 1.5 MG/0.5ML ~~LOC~~ SOAJ
1.5000 mg | SUBCUTANEOUS | 2 refills | Status: DC
Start: 2021-10-14 — End: 2022-02-20
  Filled 2021-10-14 – 2021-10-23 (×3): qty 2, 28d supply, fill #0

## 2021-10-15 ENCOUNTER — Other Ambulatory Visit: Payer: Self-pay

## 2021-10-16 ENCOUNTER — Other Ambulatory Visit: Payer: Self-pay

## 2021-10-17 ENCOUNTER — Other Ambulatory Visit (HOSPITAL_BASED_OUTPATIENT_CLINIC_OR_DEPARTMENT_OTHER): Payer: Self-pay

## 2021-10-17 DIAGNOSIS — E1159 Type 2 diabetes mellitus with other circulatory complications: Secondary | ICD-10-CM

## 2021-10-17 MED ORDER — AMLODIPINE BESYLATE 10 MG PO TABS: 10.0000 mg | ORAL_TABLET | Freq: Every day | ORAL | 0 refills | Status: DC

## 2021-10-17 MED ORDER — IRBESARTAN 150 MG PO TABS: 150.0000 mg | ORAL_TABLET | Freq: Every day | ORAL | 0 refills | Status: DC

## 2021-10-22 ENCOUNTER — Other Ambulatory Visit: Payer: Self-pay

## 2021-10-23 ENCOUNTER — Other Ambulatory Visit: Payer: Self-pay

## 2021-10-24 ENCOUNTER — Other Ambulatory Visit: Payer: Self-pay | Admitting: Family Medicine

## 2021-10-24 NOTE — Telephone Encounter (Signed)
Requested medication (s) are due for refill today: yes  Requested medication (s) are on the active medication list: yes  Last refill:  08/12/21  Future visit scheduled: no  Notes to clinic:  This medication can not be delegated, please assess.  Requested Prescriptions  Pending Prescriptions Disp Refills   Vitamin D, Ergocalciferol, (DRISDOL) 1.25 MG (50000 UNIT) CAPS capsule [Pharmacy Med Name: VITAMIN D2 1.25MG (50,000 UNIT)] 4 capsule 0    Sig: TAKE 1 CAPSULE BY MOUTH ONE TIME PER WEEK     Endocrinology:  Vitamins - Vitamin D Supplementation Failed - 10/24/2021  2:50 PM      Failed - 50,000 IU strengths are not delegated      Failed - Phosphate in normal range and within 360 days    No results found for: PHOS        Failed - Vitamin D in normal range and within 360 days    Vit D, 25-Hydroxy  Date Value Ref Range Status  07/02/2020 14.9 (L) 30.0 - 100.0 ng/mL Final    Comment:    Vitamin D deficiency has been defined by the Institute of Medicine and an Endocrine Society practice guideline as a level of serum 25-OH vitamin D less than 20 ng/mL (1,2). The Endocrine Society went on to further define vitamin D insufficiency as a level between 21 and 29 ng/mL (2). 1. IOM (Institute of Medicine). 2010. Dietary reference    intakes for calcium and D. [place]: The    [employer]. 2. Holick MF, Binkley [place], Bischoff-Ferrari HA, et al.    Evaluation, treatment, and prevention of vitamin D    deficiency: an Endocrine Society clinical practice    guideline. JCEM. 2011 Jul; 96(7):1911-30.           Passed - Ca in normal range and within 360 days    Calcium  Date Value Ref Range Status  09/17/2021 8.7 8.7 - 10.2 mg/dL Final          Passed - Valid encounter within last 12 months    Recent Outpatient Visits           1 month ago Type 2 diabetes mellitus with other neurologic complication, without long-term current use of insulin ([facility])   [place], Ali Chukson, MD   3 months ago Acute cystitis without hematuria   [place], Broadus John, FNP   4 months ago Type 2 diabetes mellitus with other neurologic complication, without long-term current use of insulin ([place])   [place], Enobong, MD   6 months ago Medicare annual wellness visit, subsequent   [place], Carroll Sage, FNP   11 months ago Type 2 diabetes mellitus with other neurologic complication, without long-term current use of insulin [facility])   [place] [facility] Charlott Rakes, MD

## 2021-10-28 ENCOUNTER — Encounter: Payer: Self-pay | Admitting: Family Medicine

## 2021-10-29 ENCOUNTER — Other Ambulatory Visit: Payer: Self-pay | Admitting: Family Medicine

## 2021-10-29 DIAGNOSIS — M25559 Pain in unspecified hip: Secondary | ICD-10-CM

## 2021-10-29 DIAGNOSIS — R202 Paresthesia of skin: Secondary | ICD-10-CM

## 2021-10-29 DIAGNOSIS — M79651 Pain in right thigh: Secondary | ICD-10-CM

## 2021-10-29 MED ORDER — PREGABALIN 100 MG PO CAPS: 100.0000 mg | ORAL_CAPSULE | Freq: Two times a day (BID) | ORAL | 3 refills | Status: DC

## 2021-10-29 MED ORDER — MELOXICAM 15 MG PO TABS: 15.0000 mg | ORAL_TABLET | Freq: Every day | ORAL | 2 refills | Status: DC

## 2021-11-18 ENCOUNTER — Other Ambulatory Visit: Payer: Self-pay | Admitting: Cardiovascular Disease

## 2021-11-18 DIAGNOSIS — I152 Hypertension secondary to endocrine disorders: Secondary | ICD-10-CM

## 2021-11-28 ENCOUNTER — Ambulatory Visit (INDEPENDENT_AMBULATORY_CARE_PROVIDER_SITE_OTHER): Payer: Medicare HMO | Admitting: Family Medicine

## 2021-11-28 ENCOUNTER — Other Ambulatory Visit (HOSPITAL_COMMUNITY)
Admission: RE | Admit: 2021-11-28 | Discharge: 2021-11-28 | Disposition: A | Discharge status: Home / Self Care | Payer: Medicare HMO | Source: Ambulatory Visit | Attending: Family Medicine | Admitting: Family Medicine

## 2021-11-28 ENCOUNTER — Ambulatory Visit: Payer: Medicare HMO | Admitting: Clinical

## 2021-11-28 ENCOUNTER — Other Ambulatory Visit: Payer: Self-pay

## 2021-11-28 ENCOUNTER — Encounter: Payer: Self-pay | Admitting: Family Medicine

## 2021-11-28 VITALS — BP 131/89 | HR 79 | Ht 63.0 in | Wt 191.9 lb

## 2021-11-28 DIAGNOSIS — Z1331 Encounter for screening for depression: Secondary | ICD-10-CM

## 2021-11-28 DIAGNOSIS — Z720 Tobacco use: Secondary | ICD-10-CM

## 2021-11-28 DIAGNOSIS — N926 Irregular menstruation, unspecified: Secondary | ICD-10-CM

## 2021-11-28 DIAGNOSIS — Z01419 Encounter for gynecological examination (general) (routine) without abnormal findings: Secondary | ICD-10-CM

## 2021-11-28 DIAGNOSIS — Z113 Encounter for screening for infections with a predominantly sexual mode of transmission: Secondary | ICD-10-CM

## 2021-11-28 DIAGNOSIS — F32A Depression, unspecified: Secondary | ICD-10-CM

## 2021-11-28 DIAGNOSIS — Z3202 Encounter for pregnancy test, result negative: Secondary | ICD-10-CM

## 2021-11-28 DIAGNOSIS — N951 Menopausal and female climacteric states: Secondary | ICD-10-CM

## 2021-11-28 DIAGNOSIS — F4323 Adjustment disorder with mixed anxiety and depressed mood: Secondary | ICD-10-CM

## 2021-11-28 DIAGNOSIS — F419 Anxiety disorder, unspecified: Secondary | ICD-10-CM | POA: Diagnosis not present

## 2021-11-28 LAB — POCT PREGNANCY, URINE: Preg Test, Ur: NEGATIVE

## 2021-11-28 MED ORDER — NICOTINE 21-14-7 MG/24HR TD KIT: 1.0000 | PACK | TRANSDERMAL | 0 refills | Status: DC

## 2021-11-28 MED ORDER — NICOTINE POLACRILEX 2 MG MT GUM: 2.0000 mg | CHEWING_GUM | OROMUCOSAL | 2 refills | Status: DC | PRN

## 2021-11-28 NOTE — Assessment & Plan Note (Signed)
Clinical signs of perimenopause, no other bothersome symptoms. Per patient request will do basic lab workup. She also discussed ongoing desire to reduce perimenstrual symptoms, we had a long discussion about hormonal IUD's, side effects, and usual expectations for bleeding pattern and symptoms. She is unsure but will call us if she decides to go that route.

## 2021-11-28 NOTE — Progress Notes (Signed)
GYNECOLOGY OFFICE VISIT NOTE  History:   Amber Rose is a 46 y.o. [phone] here today for several issues.  Reports her period has become somewhat irregular Previously very regular up to about six months ago Says that in general she would say that her period has gotten less frequent, not having more bleeding than usual for the most part She is unsure when her mother went through menopause She had previously discussed having an endometrial ablation but did not want to have an EMB because of worries about pain control Would like to have reduced period but worried about co morbid medical conditions and what her options are Would like to get testing for menopause Also wondering if this could be related to her leg pain that she has had chronically for years and gets worse with her menstrual cycle Followed extensively with other physicians for this, including PCP, orthopedic surgeon Has been recommended to have hip surgery but is worried about it  She is currently sexually active, relatively new partner Would like STI screening  Reports low mood, high stress Endorsed passive SI with CMA, but when we discussed she denied Would be interested in [place] Maintenance Due  Topic Date Due   COVID-19 Vaccine (1) Never done   OPHTHALMOLOGY EXAM  11/15/2020    Past Medical History:  Diagnosis Date   Allergy    Anxiety    Arthritis    Chronic UTI    Depression    Diabetes mellitus without complication ([place])    Diverticulosis    Fibromyalgia    GERD (gastroesophageal reflux disease)    Hiatal hernia    Hyperlipidemia    Hypertension    Internal hemorrhoids    Meningitis, viral    Pancreatitis    Pure hypercholesterolemia 12/23/2020    Past Surgical History:  Procedure Laterality Date   APPENDECTOMY  1990   ceasarian  2002   CESAREAN SECTION     x1   CHOLECYSTECTOMY  2011   COLONOSCOPY     UPPER GASTROINTESTINAL ENDOSCOPY     URINARY SURGERY     urethra sling,  removal, and then revision 2016 (4 surgeries)   WISDOM TOOTH EXTRACTION      The following portions of the patient's history were reviewed and updated as appropriate: allergies, current medications, past family history, past medical history, past social history, past surgical history and problem list.   Health Maintenance:   Last pap: Lab Results  Component Value Date   DIAGPAP  09/13/2020    - Negative for intraepithelial lesion or malignancy (NILM)   DIAGPAP - Benign reactive/reparative changes 09/13/2020   HPV NOT DETECTED 12/25/2016   [place] Negative 09/13/2020    Last mammogram:  08/22/2021 - BIRADS-1   Review of Systems:  Pertinent items noted in HPI and remainder of comprehensive ROS otherwise negative.  Physical Exam:  BP 131/89    Pulse 79    Ht 5' 3" (1.6 m)    Wt 191 lb 14.4 oz (87 kg)    LMP 11/08/2021    BMI 33.99 kg/m  CONSTITUTIONAL: Well-developed, well-nourished female in no acute distress.  HEENT:  Normocephalic, atraumatic. External right and left ear normal. No scleral icterus.  NECK: Normal range of motion, supple, no masses noted on observation SKIN: No rash noted. Not diaphoretic. No erythema. No pallor. MUSCULOSKELETAL: Normal range of motion. No edema noted. NEUROLOGIC: Alert and oriented to person, place, and time. Normal muscle tone coordination.  PSYCHIATRIC: Normal mood  and affect. Normal behavior. Normal judgment and thought content. RESPIRATORY: Effort normal, no problems with respiration noted   Labs and Imaging Results for orders placed or performed in visit on 11/28/21 (from the past 168 hour(s))  Pregnancy, urine POC   Collection Time: 11/28/21 10:12 AM  Result Value Ref Range   Preg Test, Ur NEGATIVE NEGATIVE   No results found.    Assessment and Plan:   Problem List Items Addressed This Visit       Other   Anxiety and depression    Warm hand off to [facility] at end of visit.       Tobacco abuse    Reports desire to quit. NRT sent  to her pharmacy.       Relevant Medications   Nicotine 21-14-7 MG/24HR KIT   nicotine polacrilex (NICORETTE) 2 MG gum   Well woman exam - Primary    Cancer screening: - Pap: up to date - Mammogram: up to date - Colonoscopy: up to date  Contraception: Declines, but long conversation regarding hormonal IUD, she will consider.   Mood: High PHQ9 score Endorsed passive SI to CMA, denied with me in the room Warm hand off given to [facility] who saw patient after our visit  Vaccinations: - Flu: up to date  - HPV: n/a - COVID: recommended but we did not discuss due to time constraints - PPSV23: recommended, she reports she has already received it in the past - Other: n/a  Metabolic: - DM: follows w PCP - Lipids: follows w PCP  ID: - STI: offered, accepts vaginal and serum screening      Irregular menses   Perimenopause    Clinical signs of perimenopause, no other bothersome symptoms. Per patient request will do basic lab workup. She also discussed ongoing desire to reduce perimenstrual symptoms, we had a long discussion about hormonal IUD's, side effects, and usual expectations for bleeding pattern and symptoms. She is unsure but will call us if she decides to go that route.       Relevant Orders   TSH   Prolactin   FSH   Other Visit Diagnoses     Positive depression screening       Relevant Orders   Ambulatory referral to [facility] [place]   Screening examination for sexually transmitted disease       Relevant Orders   RPR+HBsAg+HCVAb+...   Cervicovaginal ancillary only( [place])       Routine preventative health maintenance measures emphasized. Please refer to After Visit Summary for other counseling recommendations.   Return in about 1 year (around 11/28/2022) for Annual Wellness Visit.    Total face-to-face time with patient: 30 minutes.  Over 50% of encounter was spent on counseling and coordination of care.   Clarnce Flock, MD/MPH Attending  Family Medicine Physician, [facility] for [facility], [place]

## 2021-11-28 NOTE — Progress Notes (Signed)
Integrated Behavioral Health Initial In-Person Visit  MRN: [id number] Name: Loreal Schuessler  Number of [place] Clinician visits:: 1/6 Session Start time: 11:20  Session End time: 11:46 Total time:  26  minutes  Types of Service: Collaborative care and Prevention  Interpretor:No. Interpretor Name and Language: na   Warm Hand Off Completed.      I reviewed patient visit with the [facility] Intern, and I concur with the treatment plan, as documented in the [facility] Intern note.   No charge for this visit due to [facility] Intern seeing patient.  Vesta Mixer, MSW, [place] for [facility] at [facility]    Subjective: Sahmya Arai is a 46 y.o. female accompanied by  self Patient was referred by Clayton Lefort for High phq9 which indicated suicidal thoughts. Patient reports the following symptoms/concerns: Suicidal thoughts, with no plan or intent.  Duration of problem: ongoing; Severity of problem: mild  Objective: Mood: Euthymic and Affect: Appropriate Risk of harm to self or others: Suicidal ideation  Life Context: Family and Social: Took care of grandmother until she passed, mother currently has cancer. One child lives here in [place], other two are out of state.  School/Work: na Self-Care: Journaling, discovering new hobbies to make herself happy, finding companionship.  Life Changes: NA  Patient and/or Family's Strengths/Protective Factors: Sense of purpose  Goals Addressed: Patient will: Reduce symptoms of: anxiety, depression, and stress Increase knowledge and/or ability of: coping skills and stress reduction  Demonstrate ability to: Increase healthy adjustment to current life circumstances and Begin healthy grieving over loss  Progress towards Goals: Ongoing  Interventions: Interventions utilized: Supportive Counseling  Standardized Assessments completed: Not Needed  Patient  and/or Family Response: Pt agrees to current treatment plan  Patient Centered Plan: Patient is on the following Treatment Plan(s):  Call [facility] if Suicidal thoughts become too overwhelming or thoughts turn into a plan. Follow up scheduled with Roselyn Reef on 2/10  Assessment: Patient currently experiencing Suicidal thoughts with no intent to act.   Patient may benefit from referral to [facility] to continue seeking mental health services and continuing medication management.  Plan: Follow up with behavioral health clinician on : 2/10 Behavioral recommendations: [facility] card given to PT Referral(s): [place] (In Clinic) and [employer]:  Food "From scale of 1-10, how likely are you to follow plan?":   Tarri Abernethy

## 2021-11-28 NOTE — Assessment & Plan Note (Signed)
Warm hand off to [facility] at end of visit.

## 2021-11-28 NOTE — Progress Notes (Signed)
Pt wants to talk about Menopause & how she been feeling.

## 2021-11-28 NOTE — Assessment & Plan Note (Signed)
Reports desire to quit. NRT sent to her pharmacy.

## 2021-11-28 NOTE — Assessment & Plan Note (Signed)
Cancer screening: - Pap: up to date - Mammogram: up to date - Colonoscopy: up to date  Contraception: Declines, but long conversation regarding hormonal IUD, she will consider.   Mood: High PHQ9 score Endorsed passive SI to CMA, denied with me in the room Warm hand off given to [facility] who saw patient after our visit  Vaccinations: - Flu: up to date  - HPV: n/a - COVID: recommended but we did not discuss due to time constraints - PPSV23: recommended, she reports she has already received it in the past - Other: n/a  Metabolic: - DM: follows w PCP - Lipids: follows w PCP  ID: - STI: offered, accepts vaginal and serum screening

## 2021-11-28 NOTE — BH Specialist Note (Signed)
Integrated Behavioral Health Initial In-Person Visit  MRN: [id number] Name: Amber Rose  Number of [place] Clinician visits:: 1/6 Session Start time: 11:20  Session End time: 11:46 Total time: 26 minutes  Types of Service: Collaborative care and Prevention  Interpretor:No. Interpretor Name and Language: na   Warm Hand Off Completed.      I reviewed patient visit with the [facility] Intern, and I concur with the treatment plan, as documented in the [facility] Intern note.   No charge for this visit due to [facility] Intern seeing patient.  Vesta Mixer, MSW, [place] for [facility] Healthcare at [facility]    Subjective: Amber Rose is a 46 y.o. female accompanied by self Patient was referred by Clayton Lefort for High phq9 which indicated suicidal thoughts. Patient reports the following symptoms/concerns: Suicidal thoughts, with no plan or intent.  Duration of problem: ongoing; Severity of problem: mild  Objective: Mood: Euthymic and Affect: Appropriate Risk of harm to self or others: Suicidal ideation  Life Context: Family and Social: Took care of grandmother until she passed, mother currently has cancer. One child lives here in [place], other two are out of state.  School/Work: na Self-Care: Journaling, discovering new hobbies to make herself happy, finding companionship.  Life Changes: NA  Patient and/or Family's Strengths/Protective Factors: Sense of purpose  Goals Addressed: Patient will: Reduce symptoms of: anxiety, depression, and stress Increase knowledge and/or ability of: coping skills and stress reduction  Demonstrate ability to: Increase healthy adjustment to current life circumstances and Begin healthy grieving over loss  Progress towards Goals: Ongoing  Interventions: Interventions utilized: Supportive Counseling  Standardized Assessments completed: Not Needed  Patient  and/or Family Response: Pt agrees to current treatment plan  Patient Centered Plan: Patient is on the following Treatment Plan(s):  Call [facility] if Suicidal thoughts become too overwhelming or thoughts turn into a plan. Follow up scheduled with Roselyn Reef on 2/10  Assessment: Patient currently experiencing Suicidal thoughts with no intent to act.   Patient may benefit from referral to [facility] to continue seeking mental health services and continuing medication management.  Plan: Follow up with behavioral health clinician on : 2/10 Behavioral recommendations: [facility] card given to PT Referral(s): [place] (In Clinic) and [employer]:  Food "From scale of 1-10, how likely are you to follow plan?":   Amber Rose

## 2021-11-29 LAB — RPR+HBSAG+HCVAB+...
HIV Screen 4th Generation wRfx: NONREACTIVE
Hep C Virus Ab: <0.1 s/co ratio (ref 0.0–0.9)
Hepatitis B Surface Ag: NEGATIVE
RPR Ser Ql: NONREACTIVE

## 2021-11-29 LAB — TSH: TSH: 1.13 u[IU]/mL (ref 0.450–4.500)

## 2021-11-29 LAB — PROLACTIN: Prolactin: 11.2 ng/mL (ref 4.8–23.3)

## 2021-11-29 LAB — FOLLICLE STIMULATING HORMONE: FSH: 7.3 m[IU]/mL

## 2021-12-01 LAB — CERVICOVAGINAL ANCILLARY ONLY
Bacterial Vaginitis (gardnerella): NEGATIVE
Candida Glabrata: NEGATIVE
Candida Vaginitis: NEGATIVE
Chlamydia: NEGATIVE
Comment: NEGATIVE
Comment: NEGATIVE
Comment: NEGATIVE
Comment: NEGATIVE
Comment: NEGATIVE
Comment: NORMAL
Neisseria Gonorrhea: NEGATIVE
Trichomonas: NEGATIVE

## 2021-12-01 NOTE — BH Specialist Note (Signed)
Integrated Behavioral Health via Telemedicine Visit  12/01/2021 Amber Rose [id number]  Number of [place] visits: 1 Session Start time: 10:18  Session End time: 10:58 Total time:  40  Referring Provider: Lynnda Shields, MD Patient/Family location: Home [facility] Provider location: Center for [facility] at [facility] for Women  All persons participating in visit: Patient Amber Rose and Amber Rose   Types of Service: Individual psychotherapy and Telephone visit  I connected with Amber Rose and/or Amber Rose's  n/a  via  Telephone or Video Enabled Telemedicine Application  (Video is Caregility application) and verified that I am speaking with the correct person using two identifiers. Discussed confidentiality: Yes   I discussed the limitations of telemedicine and the availability of in person appointments.  Discussed there is a possibility of technology failure and discussed alternative modes of communication if that failure occurs.  I discussed that engaging in this telemedicine visit, they consent to the provision of behavioral healthcare and the services will be billed under their insurance.  Patient and/or legal guardian expressed understanding and consented to Telemedicine visit: Yes   Presenting Concerns: Patient and/or family reports the following symptoms/concerns: Many family and friend losses in the past year;  financial, health, and other life stress.  Duration of problem: Increase in stressors in past year; Severity of problem:  moderately severe  Patient and/or Family's Strengths/Protective Factors: Social connections and Sense of purpose  Goals Addressed: Patient will:  Reduce symptoms of: anxiety, depression, and stress   Increase knowledge and/or ability of: stress reduction   Demonstrate ability to: Increase healthy adjustment to current life circumstances and Increase motivation to adhere to  plan of care  Progress towards Goals: Ongoing  Interventions: Interventions utilized:  Solution-Focused Strategies and Link to [employer] Assessments completed:  PHQ9/GAD7 given in past two weeks  Patient and/or Family Response: Pt agrees with treatment plan  Assessment: Patient currently experiencing Major depressive disorder, recurrent, moderate, Anxiety disorder and Psychosocial stress.   Patient may benefit from psychoeducation and brief therapeutic interventions regarding coping with symptoms of anxiety, depression, life stress .  Plan: Follow up with behavioral health clinician on : Call Amber Rose at [phone], as needed Behavioral recommendations:  -Begin taking Wellbutrin as prescribed -Continue using self-coping strategies that have helped cope in the past -Consider additional community resources on After Visit Summary for additional support Referral(s): [place] (In Clinic) and [employer] Resources:  Food, Housing, Transportation, and [employer]  I discussed the assessment and treatment plan with the patient and/or parent/guardian. They were provided an opportunity to ask questions and all were answered. They agreed with the plan and demonstrated an understanding of the instructions.   They were advised to call back or seek an in-person evaluation if the symptoms worsen or if the condition fails to improve as anticipated.  Amber Fair, LCSW  Depression screen [facility] 2/9 11/28/2021 04/09/2021 01/20/2021 09/13/2020 07/02/2020  Decreased Interest 1 1 2 1 1   Down, Depressed, Hopeless 2 2 2 2 2   PHQ - 2 Score 3 3 4 3 3   Altered sleeping 2 2 2 2 2   Tired, decreased energy 2 2 2 2 3   Change in appetite 1 1 2  0 1  Feeling bad or failure about yourself  2 1 2 1 2   Trouble concentrating 3 2 2 2 3   Moving slowly or fidgety/restless 0 1 0 0 1  Suicidal thoughts - 0 1 1 2   PHQ-9 Score  13 12 15 11 17   Difficult doing  work/chores - Somewhat difficult - - -  Some recent data might be hidden   GAD 7 : Generalized Anxiety Score 11/28/2021 01/20/2021 09/13/2020 07/02/2020  Nervous, Anxious, on Edge 3 3 3 3   Control/stop worrying 3 3 3 3   Worry too much - different things 3 3 3 3   Trouble relaxing 3 3 3 3   Restless 1 1 1 1   Easily annoyed or irritable 2 3 3 3   Afraid - awful might happen 3 3 3 3   Total GAD 7 Score 18 19 19 19   Anxiety Difficulty - - - -

## 2021-12-05 ENCOUNTER — Ambulatory Visit (INDEPENDENT_AMBULATORY_CARE_PROVIDER_SITE_OTHER): Payer: Medicare HMO | Admitting: Clinical

## 2021-12-05 DIAGNOSIS — F331 Major depressive disorder, recurrent, moderate: Secondary | ICD-10-CM | POA: Diagnosis not present

## 2021-12-05 DIAGNOSIS — Z658 Other specified problems related to psychosocial circumstances: Secondary | ICD-10-CM

## 2021-12-05 DIAGNOSIS — F419 Anxiety disorder, unspecified: Secondary | ICD-10-CM

## 2021-12-05 NOTE — Patient Instructions (Addendum)
[facility] for [facility] at [facility] [place], [place] [address] [phone] (main office) [phone] ([place] office)  [facility] womenscentergso.[place] [address], [place], [place] [address] [phone]   or  www.http://james-garner.info/ **SNAP/EBT/ Other nutritional benefits  [facility] [address], [place], [place] [address] [phone]  or  https://palmer-smith.com/ **WIC for  women who are pregnant and postpartum, infants and children up to 4 years old  [place] [address] [place], [place] [address] [phone]   or   www.theblessedtable.org  **Food pantry  Brother Kolbe's [place], [place], [place] [address] [phone]   or   https://brotherkolbes.godaddysites.com  **Emergency food and prepared meals  [place] [address], [place], [place] [address] [phone]   or   www.cedargrovetop.us **Food pantry  [place] [facility] [address] [place], [place] [address] [phone]   or   www.https://hartman-jones.net/ **Food pantry  [employer] [facility] [address] [place], [place] [address] [phone] **Food pantry  [facility] [address], [place], [place] [address] [phone]   or   www.greensborourbanministry.org  Insurance underwriter and prepared meals  [facility] [address] [place], [place], [place], [place] [address] DomainerFinder.be  **Food pantry  [employer] [employer] [facility] [address], [place], [place] [address] [phone]   or   www.lbcnow.org  **Food pantry  One  Step Further [address], [place], [place] [address] [phone]   or   http://patterson-parker.net/ **Food pantry, nutrition education, gardening activities  [place] [address] [place], [place] [address] [phone] **Food pantry  [facility] [place] [address], [place], [place] [address] [phone]   or   www.salvationarmyofgreensboro.Lovette Cliche of [place], [place], [place] [address] [phone]   or   http://senior-resources-guilford.org [facility] on [place] [address] [place], [place] [address] [phone]   or   www.stmattchurch.com  **Food pantry  [place] [address], [place], [place] [address] [phone]   or   vandaliapresbyterianchurch.org **[place] (serves [place], [place], [place], [place], [place], [place], [place], [place], [place], [place], [place], [place], and [place] counties) [address] [place], [place] [address] [phone] http://dawson-may.com/  **Rental assistance, Home Rehabilitation,Weatherization Assistance Program, Forensic psychologist, Housing Voucher Program  [employer] for Housing and [employer] Studies: Chief Financial Officer Resources to residents of [place], [place], and [place] Make sure you have your documents ready, including:  (Household income verification: 2 months pay stubs, unemployment/social security award letter, statement of no income for all household members over 15) Photo ID for all household members over 18 Utility Bill/Rent Ledger/Lease: must show past due amount for utilities/rent, or the rental agreement if rent is current 2. Start your application online or by paper (in [place] or [place])  at:     http://boyd-evans.org/  3. Once you have completed the online application, you will get an email confirmation message from the county. Expect to hear back by phone or email at least 6-10 weeks from submitting your application.  4. While you  wait:  Call [phone] to check in on your application Let your landlord know that you've applied. Your landlord will be asked to submit documents (W-9) during this application process. Payments will be made directly to the landlord/property [place] or utility assistance for [employer], [place], and [place] at [email or web address] Questions? Call or email Renee at [phone] or [email or web address]   Eviction Mediation Program: The HOPE Program Https://www.rebuild.http://mills-williams.net/ HOPE Progam serves low-income renters in [place] counties, defined as less than or equal to 80% of the area median income for the county where the renter lives. In the following 12 counties, you should apply to your local rent and utility assistance program INSTEAD OF the HOPE Program: [place], [place], [place], [place], [place], [place], [place], [place], [place], [place], [place]  If you live outside of [place], contact [place] call center at [phone] to talk to a Program Representative Monday-Friday, 8am-5pm Note that Native American tribes also received federal funding for rent and utility assistance programs. Recognized members of the following tribes will be served by programs managed by tribal governments, including: [place] Band of Cherokee Indians, [place], [place], [place] of [place] and [place], [place], [phone] [email or web address] .[place], [phone] [email or web address] .[place] Housing  [place] [address], [place], [place] [address] [phone] www.gha-[place].[facility] [address] Lin [place], [place] [address] [phone] https://manning.com/ **Programs include: Engineer, building services and Housing Counseling, Healthy Doctor, general practice, Homeless Prevention and [place] [address] [place], [place], [place] [address] [phone] www.https://www.farmer-stevens.info/ **housing applications/recertification; tax payment relief/exemption under specific qualifications  [facility] [address], [place], [place] [address] www.onlinegreensboro.com/~maryshouse **transitional housing for women in recovery who have minor children or are pregnant  [place], [place], [place] [address] ArtistMovie.se  **emergency shelter and support services for families facing homelessness  [place] [address] [place], [place] [address] [phone] www.youthfocus.org **transitional housing to pregnant women; emergency housing for youth who have run away, are experiencing a family crisis, are victims of abuse or neglect, or are homeless  [facility] [address] [place], [place] [address] [phone] ircgso.org **Drop-in center for people experiencing homelessness; overnight warming center when temperature is 25 degrees or below  Re-Entry Staffing [place], [place], [place] [address] [phone] https://reentrystaffingagency.org/ **help with affordable housing to people experiencing homelessness or unemployment due to incarceration  [facility] [address], [place], [place] [address] [phone] www.greensborourbanministry.org  **emergency and transitional housing, rent/mortgage assistance, utility assistance  [facility] [address], [place], [place] [address] [phone] www.salvationarmyofgreensboro.org **emergency and transitional housing  Habitat for [employer] [place], [place], [place] [address] [phone] Www.habitatgreensboro.[place], [place], [place] [address] [phone] https://chshousing.org **[place] and [facility]  [facility]ousing [employer]ants Group [address] [place] [address], [place], [place] [address] [phone] arrivance.com **home buyer education courses, foreclosure prevention  [place], [place], [place] [address] [phone] WirelessNovelties.no **Environmental Exposure Assessment (investigation of homes where either children or pregnant women with a confirmed elevated blood lead level reside)  [facility] [place], [place],  [address] [phone] http://www.perez.com/ **Home Expense Assistance/Repairs Program; offers home  accessibility updates, such as ramps or bars in the bathroom  Self-Help Credit Union-[place] [address], [place], [place] [address] [phone] https://www.self-help.org/locations/[place]-branch **Offers credit-building and banking services to people unable to use traditional banking  Transportation Resources [place] ([place]) [place], [place], [place] [address] https://www.Pilgr[place]-Wilson.gov/departments/transportation/gdot-divisions/[place]-transit-agency-public-transportation-division     Fixed-route bus services, including regional fare cards for PART, [place], [place], and WSTA buses.  Reduced fare bus ID's available for Medicaid, Medicare, and "orange card" recipients.  SCAT offers  curb-to-curb and door-to-door bus services for people with disabilities who are unable to use a fixed-bus route; also offers a shared-ride program.   Helpful tips:  -Routes available online and physical maps available at the main bus hub lobby (each for a specific route) -Smartphone directions often include bus routes (see the "bus" icon, next to the "car" and "walk" icons) -Routes differ on weekends, evenings and holidays, so plan ahead!  -If you have Medicaid, Medicare, or orange card, plan to obtain a reduced-fare ID to save 50% on rides. Check days and times to obtain an ID, and bring all necessary documents.   [employer] [place]) [place], [place]. [address], [place], [place] [address] [phone] http://www.smith-bell.org/ **Fixed-bus route services, and demand response bus service for older adults  Department of Social [facility] [address] [place], [place] [address] [phone] www.ProfileWatcher.fi **Medicaid transportation is available to [facility] recipients who need assistance getting to [facility] medical appointments and providers  [facility] [address] [place] [address], [place], [place] [address] www.cjmedicaltransportation.com  ** Offers non-emergency transportation for medical appointments  Wheels [place], [place], [place] [address] [phone] www.wheels4hope.org **REFERRAL NEEDED by specific agencies (see website), after meeting specified criteria only  Mirant for [employer]) [address] [place], [place] [address] [phone]  BikerFestival.is  *Regional fixed-bus routes between counties (example: [place] to [place]) and [employer] of [place], [place], [place] [address] [phone] Http://senior-resources-guilford.org  [place] Life Insurance available (call or see website for details)

## 2021-12-22 ENCOUNTER — Other Ambulatory Visit: Payer: Self-pay | Admitting: Family Medicine

## 2021-12-22 DIAGNOSIS — R202 Paresthesia of skin: Secondary | ICD-10-CM

## 2021-12-22 DIAGNOSIS — M25559 Pain in unspecified hip: Secondary | ICD-10-CM

## 2021-12-22 DIAGNOSIS — M79651 Pain in right thigh: Secondary | ICD-10-CM

## 2021-12-22 NOTE — Telephone Encounter (Signed)
Medication Refill - Medication: methocarbamol (ROBAXIN) 750 MG tablet  pregabalin (LYRICA) 100 MG capsule omeprazole (PRILOSEC) 40 MG capsule   Has the patient contacted their pharmacy? Yes.   (Agent: If no, request that the patient contact the pharmacy for the refill. If patient does not wish to contact the pharmacy document the reason why and proceed with request.) (Agent: If yes, when and what did the pharmacy advise?)  Preferred Pharmacy (with phone number or street name):  [facility] [place]  [place] [address]  Phone: [phone] Fax: [phone]   Has the patient been seen for an appointment in the last year OR does the patient have an upcoming appointment? Yes.    Agent: Please be advised that RX refills may take up to 3 business days. We ask that you follow-up with your pharmacy.

## 2021-12-23 MED ORDER — OMEPRAZOLE 40 MG PO CPDR: DELAYED_RELEASE_CAPSULE | ORAL | 2 refills | Status: DC

## 2021-12-23 NOTE — Telephone Encounter (Signed)
Requested medication (s) are due for refill today: yes  Requested medication (s) are on the active medication list: yes  Last refill:  10/29/21 for Lyrica and 09/17/21 for Robaxin  Future visit scheduled: no  Notes to clinic:  Unable to refill per protocol, cannot delegate.      Requested Prescriptions  Pending Prescriptions Disp Refills   pregabalin (LYRICA) 100 MG capsule 60 capsule 3    Sig: Take 1 capsule (100 mg total) by mouth 2 (two) times daily.     Not Delegated - Neurology:  Anticonvulsants - Controlled - pregabalin Failed - 12/22/2021  5:57 PM      Failed - This refill cannot be delegated      Passed - Cr in normal range and within 360 days    Creat  Date Value Ref Range Status  10/28/2016 0.64 0.50 - 1.10 mg/dL Final   Creatinine, Ser  Date Value Ref Range Status  09/17/2021 0.59 0.57 - 1.00 mg/dL Final   Creatinine, Urine  Date Value Ref Range Status  10/28/2016 158 20 - 320 mg/dL Final          Passed - Completed PHQ-2 or PHQ-9 in the last 360 days      Passed - Valid encounter within last 12 months    Recent Outpatient Visits           3 months ago Type 2 diabetes mellitus with other neurologic complication, without long-term current use of insulin ([place])   [place], Sekiu, MD   5 months ago Acute cystitis without hematuria   [place] Cesar Chavez, Broadus John, FNP   6 months ago Type 2 diabetes mellitus with other neurologic complication, without long-term current use of insulin ([place])   [place], J[place], MD   8 months ago Medicare annual wellness visit, subsequent   [place], Carroll Sage, FNP   1 year ago Type 2 diabetes mellitus with other neurologic complication, without long-term current use of insulin ([place])   [place], Charlane Ferretti, MD               methocarbamol  (ROBAXIN) 750 MG tablet 180 tablet 1    Sig: Take 1 tablet (750 mg total) by mouth every 8 (eight) hours as needed for muscle spasms.     Not Delegated - Analgesics:  Muscle Relaxants Failed - 12/22/2021  5:57 PM      Failed - This refill cannot be delegated      Passed - Valid encounter within last 6 months    Recent Outpatient Visits           3 months ago Type 2 diabetes mellitus with other neurologic complication, without long-term current use of insulin ([place])   [place], Del Aire, MD   5 months ago Acute cystitis without hematuria   [place], Broadus John, FNP   6 months ago Type 2 diabetes mellitus with other neurologic complication, without long-term current use of insulin ([place])   [place], Enobong, MD   8 months ago Medicare annual wellness visit, subsequent   [place], Carroll Sage, FNP   1 year ago Type 2 diabetes mellitus with other neurologic complication, without long-term current use of insulin [facility])   [place]  Wellness Charlott Rakes, MD              Signed Prescriptions Disp Refills   omeprazole (PRILOSEC) 40 MG capsule 90 capsule 2    Sig: Take 1 tab every morning.     Gastroenterology: Proton Pump Inhibitors Passed - 12/22/2021  5:57 PM      Passed - Valid encounter within last 12 months    Recent Outpatient Visits           3 months ago Type 2 diabetes mellitus with other neurologic complication, without long-term current use of insulin ([place])   [place], Maria Antonia, MD   5 months ago Acute cystitis without hematuria   [place], Broadus John, FNP   6 months ago Type 2 diabetes mellitus with other neurologic complication, without long-term current use of insulin ([place])   [place], Enobong, MD   8 months ago Medicare annual wellness visit, subsequent   [place], Carroll Sage, FNP   1 year ago Type 2 diabetes mellitus with other neurologic complication, without long-term current use of insulin [facility])   [place] [facility] Charlott Rakes, MD

## 2021-12-23 NOTE — Telephone Encounter (Signed)
Requested Prescriptions  Pending Prescriptions Disp Refills   pregabalin (LYRICA) 100 MG capsule 60 capsule 3    Sig: Take 1 capsule (100 mg total) by mouth 2 (two) times daily.     Not Delegated - Neurology:  Anticonvulsants - Controlled - pregabalin Failed - 12/22/2021  5:57 PM      Failed - This refill cannot be delegated      Passed - Cr in normal range and within 360 days    Creat  Date Value Ref Range Status  10/28/2016 0.64 0.50 - 1.10 mg/dL Final   Creatinine, Ser  Date Value Ref Range Status  09/17/2021 0.59 0.57 - 1.00 mg/dL Final   Creatinine, Urine  Date Value Ref Range Status  10/28/2016 158 20 - 320 mg/dL Final         Passed - Completed PHQ-2 or PHQ-9 in the last 360 days      Passed - Valid encounter within last 12 months    Recent Outpatient Visits          3 months ago Type 2 diabetes mellitus with other neurologic complication, without long-term current use of insulin ([place])   [place], Morley, MD   5 months ago Acute cystitis without hematuria   [place], Broadus John, FNP   6 months ago Type 2 diabetes mellitus with other neurologic complication, without long-term current use of insulin ([place])   [place], Charlane Ferretti, MD   8 months ago Medicare annual wellness visit, subsequent   [place], Carroll Sage, FNP   1 year ago Type 2 diabetes mellitus with other neurologic complication, without long-term current use of insulin ([place])   [place]irard, Walk[place], MD              omeprazole (PRILOSEC) 40 MG capsule 90 capsule 3    Sig: Take 1 tab every morning.     Gastroenterology: Proton Pump Inhibitors Passed - 12/22/2021  5:57 PM      Passed - Valid encounter within last 12 months    Recent Outpatient Visits          3 months ago Type 2 diabetes mellitus with other  neurologic complication, without long-term current use of insulin ([place])   [place], [place]on, MD   5 months ago Acute cystitis without hematuria   [place], Broadus John, FNP   6 months ago Type 2 diabetes mellitus with other neurologic complication, without long-term current use of insulin ([place])   [place], Saltese, MD   8 months ago Medicare annual wellness visit, subsequent   [place], Carroll Sage, FNP   1 year ago Type 2 diabetes mellitus with other neurologic complication, without long-term current use of insulin ([place])   [place], Charlane Ferretti, MD              methocarbamol (ROBAXIN) 750 MG tablet 180 tablet 1    Sig: Take 1 tablet (750 mg total) by mouth every 8 (eight) hours as needed for muscle spasms.     Not Delegated - Analgesics:  Muscle Relaxants Failed - 12/22/2021  5:57 PM      Failed - This refill cannot be delegated      Passed - Valid encounter  within last 6 months    Recent Outpatient Visits          3 months ago Type 2 diabetes mellitus with other neurologic complication, without long-term current use of insulin ([place])   [place], Thunderbolt, MD   5 months ago Acute cystitis without hematuria   [place], Broadus John, FNP   6 months ago Type 2 diabetes mellitus with other neurologic complication, without long-term current use of insulin [facility])   [place], Enobong, MD   8 months ago Medicare annual wellness visit, subsequent   [place], Carroll Sage, FNP   1 year ago Type 2 diabetes mellitus with other neurologic complication, without long-term current use of insulin [facility])   [place] [facility] Charlott Rakes, MD

## 2021-12-24 MED ORDER — METHOCARBAMOL 750 MG PO TABS: 750.0000 mg | ORAL_TABLET | Freq: Three times a day (TID) | ORAL | 0 refills | Status: DC | PRN

## 2021-12-24 MED ORDER — PREGABALIN 100 MG PO CAPS: 100.0000 mg | ORAL_CAPSULE | Freq: Two times a day (BID) | ORAL | 3 refills | Status: DC

## 2021-12-29 ENCOUNTER — Other Ambulatory Visit: Payer: Self-pay | Admitting: Family Medicine

## 2021-12-30 NOTE — Telephone Encounter (Signed)
Requested Prescriptions  ?Pending Prescriptions Disp Refills  ?? TRULICITY 3 YS/0.6TK SOPN [Pharmacy Med Name: Trulicity Subcutaneous Solution Pen-injector 3 MG/0.5ML] 6 mL 0  ?  Sig: INJECT 3 MG (0.5 ML) UNDER THE SKIN ONCE A WEEK  ?  ? Endocrinology:  Diabetes - GLP-1 Receptor Agonists Passed - 12/29/2021 10:24 AM  ?  ?  Passed - HBA1C is between 0 and 7.9 and within 180 days  ?  HbA1c, POC (controlled diabetic range)  ?Date Value Ref Range Status  ?09/17/2021 7.1 (A) 0.0 - 7.0 % Final  ?   ?  ?  Passed - Valid encounter within last 6 months  ?  Recent Outpatient Visits   ?      ? 3 months ago Type 2 diabetes mellitus with other neurologic complication, without long-term current use of insulin ([place])  ? [place], Charlane Ferretti, MD  ? 6 months ago Acute cystitis without hematuria  ? [place] Feliberto Gottron, Berthold  ? 7 months ago Type 2 diabetes mellitus with other neurologic complication, without long-term current use of insulin ([place])  ? [place], MD  ? 9 months ago Medicare annual wellness visit, subsequent  ? [place] Scot Jun, FNP  ? 1 year ago Type 2 diabetes mellitus with other neurologic complication, without long-term current use of insulin ([place])  ? [place] Charlott Rakes, MD  ?  ?  ? ?  ?  ?  ? ?

## 2022-01-01 DIAGNOSIS — M9903 Segmental and somatic dysfunction of lumbar region: Secondary | ICD-10-CM | POA: Diagnosis not present

## 2022-01-01 DIAGNOSIS — M25551 Pain in right hip: Secondary | ICD-10-CM | POA: Diagnosis not present

## 2022-01-01 DIAGNOSIS — M9904 Segmental and somatic dysfunction of sacral region: Secondary | ICD-10-CM | POA: Diagnosis not present

## 2022-01-01 DIAGNOSIS — M9901 Segmental and somatic dysfunction of cervical region: Secondary | ICD-10-CM | POA: Diagnosis not present

## 2022-01-01 DIAGNOSIS — M9902 Segmental and somatic dysfunction of thoracic region: Secondary | ICD-10-CM | POA: Diagnosis not present

## 2022-01-01 DIAGNOSIS — M4727 Other spondylosis with radiculopathy, lumbosacral region: Secondary | ICD-10-CM | POA: Diagnosis not present

## 2022-01-05 DIAGNOSIS — M9903 Segmental and somatic dysfunction of lumbar region: Secondary | ICD-10-CM | POA: Diagnosis not present

## 2022-01-05 DIAGNOSIS — M9904 Segmental and somatic dysfunction of sacral region: Secondary | ICD-10-CM | POA: Diagnosis not present

## 2022-01-05 DIAGNOSIS — M9902 Segmental and somatic dysfunction of thoracic region: Secondary | ICD-10-CM | POA: Diagnosis not present

## 2022-01-05 DIAGNOSIS — M9901 Segmental and somatic dysfunction of cervical region: Secondary | ICD-10-CM | POA: Diagnosis not present

## 2022-01-05 DIAGNOSIS — M4727 Other spondylosis with radiculopathy, lumbosacral region: Secondary | ICD-10-CM | POA: Diagnosis not present

## 2022-01-05 DIAGNOSIS — M25551 Pain in right hip: Secondary | ICD-10-CM | POA: Diagnosis not present

## 2022-01-07 DIAGNOSIS — M9902 Segmental and somatic dysfunction of thoracic region: Secondary | ICD-10-CM | POA: Diagnosis not present

## 2022-01-07 DIAGNOSIS — M9903 Segmental and somatic dysfunction of lumbar region: Secondary | ICD-10-CM | POA: Diagnosis not present

## 2022-01-07 DIAGNOSIS — M4727 Other spondylosis with radiculopathy, lumbosacral region: Secondary | ICD-10-CM | POA: Diagnosis not present

## 2022-01-07 DIAGNOSIS — M9901 Segmental and somatic dysfunction of cervical region: Secondary | ICD-10-CM | POA: Diagnosis not present

## 2022-01-07 DIAGNOSIS — M25551 Pain in right hip: Secondary | ICD-10-CM | POA: Diagnosis not present

## 2022-01-07 DIAGNOSIS — M9904 Segmental and somatic dysfunction of sacral region: Secondary | ICD-10-CM | POA: Diagnosis not present

## 2022-01-11 ENCOUNTER — Other Ambulatory Visit: Payer: Self-pay | Admitting: Cardiovascular Disease

## 2022-01-11 DIAGNOSIS — E1159 Type 2 diabetes mellitus with other circulatory complications: Secondary | ICD-10-CM

## 2022-01-12 DIAGNOSIS — M9902 Segmental and somatic dysfunction of thoracic region: Secondary | ICD-10-CM | POA: Diagnosis not present

## 2022-01-12 DIAGNOSIS — M9901 Segmental and somatic dysfunction of cervical region: Secondary | ICD-10-CM | POA: Diagnosis not present

## 2022-01-12 DIAGNOSIS — M9904 Segmental and somatic dysfunction of sacral region: Secondary | ICD-10-CM | POA: Diagnosis not present

## 2022-01-12 DIAGNOSIS — M25551 Pain in right hip: Secondary | ICD-10-CM | POA: Diagnosis not present

## 2022-01-12 DIAGNOSIS — M4727 Other spondylosis with radiculopathy, lumbosacral region: Secondary | ICD-10-CM | POA: Diagnosis not present

## 2022-01-12 DIAGNOSIS — M9903 Segmental and somatic dysfunction of lumbar region: Secondary | ICD-10-CM | POA: Diagnosis not present

## 2022-01-12 NOTE — Telephone Encounter (Signed)
Rx(s) sent to pharmacy electronically.  

## 2022-01-14 DIAGNOSIS — M9902 Segmental and somatic dysfunction of thoracic region: Secondary | ICD-10-CM | POA: Diagnosis not present

## 2022-01-14 DIAGNOSIS — M9901 Segmental and somatic dysfunction of cervical region: Secondary | ICD-10-CM | POA: Diagnosis not present

## 2022-01-14 DIAGNOSIS — M9903 Segmental and somatic dysfunction of lumbar region: Secondary | ICD-10-CM | POA: Diagnosis not present

## 2022-01-14 DIAGNOSIS — M4727 Other spondylosis with radiculopathy, lumbosacral region: Secondary | ICD-10-CM | POA: Diagnosis not present

## 2022-01-14 DIAGNOSIS — M9904 Segmental and somatic dysfunction of sacral region: Secondary | ICD-10-CM | POA: Diagnosis not present

## 2022-01-14 DIAGNOSIS — M25551 Pain in right hip: Secondary | ICD-10-CM | POA: Diagnosis not present

## 2022-01-19 ENCOUNTER — Other Ambulatory Visit: Payer: Self-pay | Admitting: Family Medicine

## 2022-01-19 DIAGNOSIS — M79651 Pain in right thigh: Secondary | ICD-10-CM

## 2022-01-19 DIAGNOSIS — M79652 Pain in left thigh: Secondary | ICD-10-CM

## 2022-01-22 ENCOUNTER — Other Ambulatory Visit: Payer: Self-pay | Admitting: Family Medicine

## 2022-01-22 DIAGNOSIS — M25559 Pain in unspecified hip: Secondary | ICD-10-CM

## 2022-01-22 DIAGNOSIS — M79651 Pain in right thigh: Secondary | ICD-10-CM

## 2022-01-22 NOTE — Telephone Encounter (Signed)
Requested medications are due for refill today.  yes ? ?Requested medications are on the active medications list.  yes ? ?Last refill. 10/29/2021 #30 2 refills ? ?Future visit scheduled.   no ? ?Notes to clinic.  Failed refill protocol due to expired labs. ? ? ? ?Requested Prescriptions  ?Pending Prescriptions Disp Refills  ? meloxicam (MOBIC) 15 MG tablet [Pharmacy Med Name: MELOXICAM 15 MG TABLET] 30 tablet 2  ?  Sig: Take 1 tablet (15 mg total) by mouth daily.  ?  ? Analgesics:  COX2 Inhibitors Failed - 01/22/2022  1:44 AM  ?  ?  Failed - Manual Review: Labs are only required if the patient has taken medication for more than 8 weeks.  ?  ?  Failed - HGB in normal range and within 360 days  ?  Hemoglobin  ?Date Value Ref Range Status  ?06/01/2019 11.8 11.1 - 15.9 g/dL Final  ?  ?  ?  ?  Failed - HCT in normal range and within 360 days  ?  Hematocrit  ?Date Value Ref Range Status  ?06/01/2019 37.8 34.0 - 46.6 % Final  ?  ?  ?  ?  Failed - AST in normal range and within 360 days  ?  AST  ?Date Value Ref Range Status  ?11/26/2020 13 0 - 40 IU/L Final  ?  ?  ?  ?  Failed - ALT in normal range and within 360 days  ?  ALT  ?Date Value Ref Range Status  ?11/26/2020 15 0 - 32 IU/L Final  ?  ?  ?  ?  Passed - Cr in normal range and within 360 days  ?  Creat  ?Date Value Ref Range Status  ?10/28/2016 0.64 0.50 - 1.10 mg/dL Final  ? ?Creatinine, Ser  ?Date Value Ref Range Status  ?09/17/2021 0.59 0.57 - 1.00 mg/dL Final  ? ?Creatinine, Urine  ?Date Value Ref Range Status  ?10/28/2016 158 20 - 320 mg/dL Final  ?  ?  ?  ?  Passed - eGFR is 30 or above and within 360 days  ?  GFR, Est African American  ?Date Value Ref Range Status  ?10/28/2016 >89 >=60 mL/min Final  ? ?GFR calc Af Amer  ?Date Value Ref Range Status  ?12/12/2020 127 >59 mL/min/1.73 Final  ?  Comment:  ?  **In accordance with recommendations from the NKF-ASN Task force,** ?  [facility] is in the process of updating its eGFR calculation to the ?  2021 CKD-EPI creatinine  equation that estimates kidney function ?  without a race variable. ?  ? ?GFR, Est Non African American  ?Date Value Ref Range Status  ?10/28/2016 >89 >=60 mL/min Final  ? ?GFR calc non Af Amer  ?Date Value Ref Range Status  ?12/12/2020 110 >59 mL/min/1.73 Final  ? ?eGFR  ?Date Value Ref Range Status  ?09/17/2021 113 >59 mL/min/1.73 Final  ?  ?  ?  ?  Passed - Patient is not pregnant  ?  ?  Passed - Valid encounter within last 12 months  ?  Recent Outpatient Visits   ? ?      ? 4 months ago Type 2 diabetes mellitus with other neurologic complication, without long-term current use of insulin ([name])  ? [place], Charlane Ferretti, MD  ? 6 months ago Acute cystitis without hematuria  ? [place] Feliberto Gottron, [place]  ? 7 months ago Type 2 diabetes  mellitus with other neurologic complication, without long-term current use of insulin ([place])  ? [place], MD  ? 9 months ago Medicare annual wellness visit, subsequent  ? [place] Scot Jun, FNP  ? 1 year ago Type 2 diabetes mellitus with other neurologic complication, without long-term current use of insulin ([place])  ? [place] Charlott Rakes, MD  ? ?  ?  ? ?  ?  ?  ?  ?

## 2022-01-23 ENCOUNTER — Other Ambulatory Visit: Payer: Self-pay | Admitting: Family Medicine

## 2022-01-23 DIAGNOSIS — M25559 Pain in unspecified hip: Secondary | ICD-10-CM

## 2022-01-23 DIAGNOSIS — M79651 Pain in right thigh: Secondary | ICD-10-CM

## 2022-01-23 DIAGNOSIS — Z8619 Personal history of other infectious and parasitic diseases: Secondary | ICD-10-CM

## 2022-01-23 NOTE — Telephone Encounter (Signed)
meloxicam (MOBIC) 15 MG tablet ( [facility] but [facility] needs new RX sent to them)  ?

## 2022-01-23 NOTE — Telephone Encounter (Signed)
Medication Refill - Medication: acyclovir (ZOVIRAX) 400 MG tablet ? ?meloxicam (MOBIC) 15 MG tablet ( this was sent to [facility] yesterday but [facility] needs new RX sent to them)  ? ?buPROPion (WELLBUTRIN SR) 150 MG 12 hr tablet  ? ?Has the patient contacted their pharmacy? Yes.   ?(Agent: If no, request that the patient contact the pharmacy for the refill. If patient does not wish to contact the pharmacy document the reason why and proceed with request.) ?(Agent: If yes, when and what did the pharmacy advise?) ? ?Preferred Pharmacy (with phone number or street name):  ?[place], [place] Phone:  [phone]  ?Fax:  [phone]  ?  ? ?Has the patient been seen for an appointment in the last year OR does the patient have an upcoming appointment? Yes.   ? ?Agent: Please be advised that RX refills may take up to 3 business days. We ask that you follow-up with your pharmacy. ?

## 2022-01-23 NOTE — Telephone Encounter (Signed)
Sent refill request for Meloxicam in encounter on 01/22/22 to provider. ?

## 2022-01-26 DIAGNOSIS — M9902 Segmental and somatic dysfunction of thoracic region: Secondary | ICD-10-CM | POA: Diagnosis not present

## 2022-01-26 DIAGNOSIS — M9904 Segmental and somatic dysfunction of sacral region: Secondary | ICD-10-CM | POA: Diagnosis not present

## 2022-01-26 DIAGNOSIS — M25551 Pain in right hip: Secondary | ICD-10-CM | POA: Diagnosis not present

## 2022-01-26 DIAGNOSIS — M9903 Segmental and somatic dysfunction of lumbar region: Secondary | ICD-10-CM | POA: Diagnosis not present

## 2022-01-26 DIAGNOSIS — M4727 Other spondylosis with radiculopathy, lumbosacral region: Secondary | ICD-10-CM | POA: Diagnosis not present

## 2022-01-26 DIAGNOSIS — M9901 Segmental and somatic dysfunction of cervical region: Secondary | ICD-10-CM | POA: Diagnosis not present

## 2022-01-26 NOTE — Telephone Encounter (Signed)
Requested medications are due for refill today.  yes ? ?Requested medications are on the active medications list.  yes ? ?Last refill. Acyclovir 08/24/2021 #180 0 refills, Wellbutrin 09/17/2021 #60 3 refills. ? ?Future visit scheduled.   no ? ?Notes to clinic.  Pharmacy needs Dx code for Wellbutrin. Labs are expired for Acyclovir. ? ? ? ?Requested Prescriptions  ?Pending Prescriptions Disp Refills  ? acyclovir (ZOVIRAX) 400 MG tablet 180 tablet 0  ?  Sig: Take 1 tablet (400 mg total) by mouth 2 (two) times daily.  ?  ? Antimicrobials:  Antiviral Agents - Anti-Herpetic Passed - 01/23/2022  1:04 PM  ?  ?  Passed - Valid encounter within last 12 months  ?  Recent Outpatient Visits   ? ?      ? 4 months ago Type 2 diabetes mellitus with other neurologic complication, without long-term current use of insulin ([place])  ? [place], Charlane Ferretti, MD  ? 7 months ago Acute cystitis without hematuria  ? [place] Feliberto Gottron, [place]  ? 7 months ago Type 2 diabetes mellitus with other neurologic complication, without long-term current use of insulin ([place])  ? [place], MD  ? 9 months ago Medicare annual wellness visit, subsequent  ? [place] Scot Jun, FNP  ? 1 year ago Type 2 diabetes mellitus with other neurologic complication, without long-term current use of insulin ([place])  ? [place] Charlott Rakes, MD  ? ?  ?  ? ?  ?  ?  ? buPROPion (WELLBUTRIN SR) 150 MG 12 hr tablet 60 tablet 3  ?  Sig: Take 1 tablet (150 mg total) by mouth 2 (two) times daily. 1 tablet daily for 3 days and then twice a day for 12 weeks  ?  ? Psychiatry: Antidepressants - bupropion Failed - 01/23/2022  1:04 PM  ?  ?  Failed - AST in normal range and within 360 days  ?  AST  ?Date Value Ref Range Status  ?11/26/2020 13 0 - 40 IU/L Final  ?  ?  ?  ?  Failed - ALT in  normal range and within 360 days  ?  ALT  ?Date Value Ref Range Status  ?11/26/2020 15 0 - 32 IU/L Final  ?  ?  ?  ?  Passed - Cr in normal range and within 360 days  ?  Creat  ?Date Value Ref Range Status  ?10/28/2016 0.64 0.50 - 1.10 mg/dL Final  ? ?Creatinine, Ser  ?Date Value Ref Range Status  ?09/17/2021 0.59 0.57 - 1.00 mg/dL Final  ? ?Creatinine, Urine  ?Date Value Ref Range Status  ?10/28/2016 158 20 - 320 mg/dL Final  ?  ?  ?  ?  Passed - Completed PHQ-2 or PHQ-9 in the last 360 days  ?  ?  Passed - Last BP in normal range  ?  BP Readings from Last 1 Encounters:  ?11/28/21 131/89  ?  ?  ?  ?  Passed - Valid encounter within last 6 months  ?  Recent Outpatient Visits   ? ?      ? 4 months ago Type 2 diabetes mellitus with other neurologic complication, without long-term current use of insulin ([place])  ? [place], Charlane Ferretti, MD  ? 7 months ago Acute cystitis without hematuria  ?  [place] Feliberto Gottron, [place]  ? 7 months ago Type 2 diabetes mellitus with other neurologic complication, without long-term current use of insulin ([place])  ? [place], MD  ? 9 months ago Medicare annual wellness visit, subsequent  ? [place] Scot Jun, FNP  ? 1 year ago Type 2 diabetes mellitus with other neurologic complication, without long-term current use of insulin ([place])  ? [place] Charlott Rakes, MD  ? ?  ?  ? ?  ?  ?  ?  ?

## 2022-01-27 MED ORDER — BUPROPION HCL ER (SR) 150 MG PO TB12: 150.0000 mg | ORAL_TABLET | Freq: Two times a day (BID) | ORAL | 0 refills | Status: DC

## 2022-01-27 MED ORDER — ACYCLOVIR 400 MG PO TABS: 400.0000 mg | ORAL_TABLET | Freq: Two times a day (BID) | ORAL | 0 refills | Status: DC

## 2022-01-29 DIAGNOSIS — M25551 Pain in right hip: Secondary | ICD-10-CM | POA: Diagnosis not present

## 2022-01-29 DIAGNOSIS — E7800 Pure hypercholesterolemia, unspecified: Secondary | ICD-10-CM | POA: Diagnosis not present

## 2022-01-29 DIAGNOSIS — M9901 Segmental and somatic dysfunction of cervical region: Secondary | ICD-10-CM | POA: Diagnosis not present

## 2022-01-29 DIAGNOSIS — M9902 Segmental and somatic dysfunction of thoracic region: Secondary | ICD-10-CM | POA: Diagnosis not present

## 2022-01-29 DIAGNOSIS — M9903 Segmental and somatic dysfunction of lumbar region: Secondary | ICD-10-CM | POA: Diagnosis not present

## 2022-01-29 DIAGNOSIS — M4727 Other spondylosis with radiculopathy, lumbosacral region: Secondary | ICD-10-CM | POA: Diagnosis not present

## 2022-01-29 DIAGNOSIS — M9904 Segmental and somatic dysfunction of sacral region: Secondary | ICD-10-CM | POA: Diagnosis not present

## 2022-01-29 LAB — LIPID PANEL
Chol/HDL Ratio: 3.7 ratio (ref 0.0–4.4)
Cholesterol, Total: 233 mg/dL — ABNORMAL HIGH (ref 100–199)
HDL: 63 mg/dL (ref >39)
LDL Chol Calc (NIH): 148 mg/dL — ABNORMAL HIGH (ref 0–99)
Triglycerides: 124 mg/dL (ref 0–149)
VLDL Cholesterol Cal: 22 mg/dL (ref 5–40)

## 2022-01-29 LAB — HEPATIC FUNCTION PANEL
ALT: 15 IU/L (ref 0–32)
AST: 14 IU/L (ref 0–40)
Albumin: 4 g/dL (ref 3.8–4.8)
Alkaline Phosphatase: 123 IU/L — ABNORMAL HIGH (ref 44–121)
Bilirubin Total: <0.2 mg/dL (ref 0.0–1.2)
Bilirubin, Direct: <0.1 mg/dL (ref 0.00–0.40)
Total Protein: 6.4 g/dL (ref 6.0–8.5)

## 2022-02-02 DIAGNOSIS — M25551 Pain in right hip: Secondary | ICD-10-CM | POA: Diagnosis not present

## 2022-02-02 DIAGNOSIS — M9904 Segmental and somatic dysfunction of sacral region: Secondary | ICD-10-CM | POA: Diagnosis not present

## 2022-02-02 DIAGNOSIS — M4727 Other spondylosis with radiculopathy, lumbosacral region: Secondary | ICD-10-CM | POA: Diagnosis not present

## 2022-02-02 DIAGNOSIS — M9903 Segmental and somatic dysfunction of lumbar region: Secondary | ICD-10-CM | POA: Diagnosis not present

## 2022-02-02 DIAGNOSIS — M9902 Segmental and somatic dysfunction of thoracic region: Secondary | ICD-10-CM | POA: Diagnosis not present

## 2022-02-02 DIAGNOSIS — M9901 Segmental and somatic dysfunction of cervical region: Secondary | ICD-10-CM | POA: Diagnosis not present

## 2022-02-04 ENCOUNTER — Telehealth: Payer: Self-pay

## 2022-02-04 DIAGNOSIS — E7800 Pure hypercholesterolemia, unspecified: Secondary | ICD-10-CM

## 2022-02-04 MED ORDER — REPATHA SURECLICK 140 MG/ML ~~LOC~~ SOAJ: 140.0000 mg | SUBCUTANEOUS | 11 refills | Status: DC

## 2022-02-04 NOTE — Addendum Note (Signed)
Addended by: Allean Found on: 02/04/2022 03:40 PM ? ? Modules accepted: Orders ? ?

## 2022-02-04 NOTE — Telephone Encounter (Signed)
Amber Rose (Key: [id number]) FOR REPATHA '140MG'$  Q2W ? ?Lipid/hepatic panel ordered and released. PER DR. Prince Solian REQUEST.  ? ?WILL UPDATE PT WHEN DETERMINATION COMES ?

## 2022-02-04 NOTE — Telephone Encounter (Signed)
Called and spoke to pt and stated that they were approved for repatha, rx sent, instructed the pt to call [employer] to apply for cost assistance. Pt voiced understanding.  ?

## 2022-02-06 NOTE — Telephone Encounter (Signed)
Received PA for repatha, according to telephone encounter this is already completed and approved.  ? ?Routing to [employer] to double check that she does not need the fax  ?

## 2022-02-09 DIAGNOSIS — M9903 Segmental and somatic dysfunction of lumbar region: Secondary | ICD-10-CM | POA: Diagnosis not present

## 2022-02-09 DIAGNOSIS — M9901 Segmental and somatic dysfunction of cervical region: Secondary | ICD-10-CM | POA: Diagnosis not present

## 2022-02-09 DIAGNOSIS — M4727 Other spondylosis with radiculopathy, lumbosacral region: Secondary | ICD-10-CM | POA: Diagnosis not present

## 2022-02-09 DIAGNOSIS — M25551 Pain in right hip: Secondary | ICD-10-CM | POA: Diagnosis not present

## 2022-02-09 DIAGNOSIS — M9902 Segmental and somatic dysfunction of thoracic region: Secondary | ICD-10-CM | POA: Diagnosis not present

## 2022-02-09 DIAGNOSIS — M9904 Segmental and somatic dysfunction of sacral region: Secondary | ICD-10-CM | POA: Diagnosis not present

## 2022-02-20 ENCOUNTER — Encounter: Payer: Self-pay | Admitting: Physician Assistant

## 2022-02-20 ENCOUNTER — Ambulatory Visit: Payer: Medicare HMO | Admitting: Physician Assistant

## 2022-02-20 ENCOUNTER — Other Ambulatory Visit (INDEPENDENT_AMBULATORY_CARE_PROVIDER_SITE_OTHER): Payer: Medicare HMO

## 2022-02-20 VITALS — BP 102/66 | HR 82 | Ht 63.0 in | Wt 190.0 lb

## 2022-02-20 DIAGNOSIS — R194 Change in bowel habit: Secondary | ICD-10-CM

## 2022-02-20 DIAGNOSIS — R1032 Left lower quadrant pain: Secondary | ICD-10-CM

## 2022-02-20 DIAGNOSIS — R195 Other fecal abnormalities: Secondary | ICD-10-CM

## 2022-02-20 LAB — CBC WITH DIFFERENTIAL/PLATELET
Basophils Absolute: 0.1 10*3/uL (ref 0.0–0.1)
Basophils Relative: 0.7 % (ref 0.0–3.0)
Eosinophils Absolute: 0.1 10*3/uL (ref 0.0–0.7)
Eosinophils Relative: 0.6 % (ref 0.0–5.0)
HCT: 38.7 % (ref 36.0–46.0)
Hemoglobin: 12.7 g/dL (ref 12.0–15.0)
Lymphocytes Relative: 16.7 % (ref 12.0–46.0)
Lymphs Abs: 2 10*3/uL (ref 0.7–4.0)
MCHC: 32.9 g/dL (ref 30.0–36.0)
MCV: 88.8 fl (ref 78.0–100.0)
Monocytes Absolute: 0.7 10*3/uL (ref 0.1–1.0)
Monocytes Relative: 5.6 % (ref 3.0–12.0)
Neutro Abs: 9.2 10*3/uL — ABNORMAL HIGH (ref 1.4–7.7)
Neutrophils Relative %: 76.4 % (ref 43.0–77.0)
Platelets: 389 10*3/uL (ref 150.0–400.0)
RBC: 4.36 Mil/uL (ref 3.87–5.11)
RDW: 16 % — ABNORMAL HIGH (ref 11.5–15.5)
WBC: 12 10*3/uL — ABNORMAL HIGH (ref 4.0–10.5)

## 2022-02-20 LAB — COMPREHENSIVE METABOLIC PANEL
ALT: 13 U/L (ref 0–35)
AST: 13 U/L (ref 0–37)
Albumin: 4 g/dL (ref 3.5–5.2)
Alkaline Phosphatase: 96 U/L (ref 39–117)
BUN: 10 mg/dL (ref 6–23)
CO2: 25 mEq/L (ref 19–32)
Calcium: 9.3 mg/dL (ref 8.4–10.5)
Chloride: 102 mEq/L (ref 96–112)
Creatinine, Ser: 0.64 mg/dL (ref 0.40–1.20)
GFR: 106.09 mL/min (ref >60.00)
Glucose, Bld: 108 mg/dL — ABNORMAL HIGH (ref 70–99)
Potassium: 4.1 mEq/L (ref 3.5–5.1)
Sodium: 135 mEq/L (ref 135–145)
Total Bilirubin: 0.4 mg/dL (ref 0.2–1.2)
Total Protein: 7.4 g/dL (ref 6.0–8.3)

## 2022-02-20 MED ORDER — HYOSCYAMINE SULFATE 0.125 MG SL SUBL: 0.1250 mg | SUBLINGUAL_TABLET | SUBLINGUAL | 2 refills | Status: DC | PRN

## 2022-02-20 NOTE — Patient Instructions (Signed)
Your provider has requested that you go to the basement level for lab work before leaving today. Press "B" on the elevator. The lab is located at the first door on the left as you exit the elevator.  ? ?We have sent the following medications to your pharmacy for you to pick up at your convenience:   Hyoscyamine ? ? ?Follow up per recommendations after tests are completed ? ?You have been scheduled for a CT scan of the abdomen and pelvis at [facility], 1st floor Radiology. You are scheduled on 03/02/2022  at 9:00am. You should arrive 15 minutes prior to your appointment time for registration.  Please pick up 2 bottles of contrast from [place] at least 3 days prior to your scan. The solution may taste better if refrigerated, but do NOT add ice or any other liquid to this solution. Shake well before drinking.  ? ?Please follow the written instructions below on the day of your exam:  ? ?1) Do not eat anything after 5:00 am (4 hours prior to your test)  ? ?2) Drink 1 bottle of contrast @ 7:00 am (2 hours prior to your exam)  Remember to shake well before drinking and do NOT pour over ice. ?    Drink 1 bottle of contrast @ 8:00 am (1 hour prior to your exam)  ? ?You may take any medications as prescribed with a small amount of water, if necessary. If you take any of the following medications: METFORMIN, GLUCOPHAGE, GLUCOVANCE, AVANDAMET, RIOMET, FORTAMET, ACTOPLUS MET, JANUMET, [place] or METAGLIP, you MAY be asked to HOLD this medication 48 hours AFTER the exam.  ? ?The purpose of you drinking the oral contrast is to aid in the visualization of your intestinal tract. The contrast solution may cause some diarrhea. Depending on your individual set of symptoms, you may also receive an intravenous injection of x-ray contrast/dye. Plan on being at [facility] for 45 minutes or longer, depending on the type of exam you are having performed.  ? ?If you have any questions regarding your exam or if you need to  reschedule, you may call [facility] at [phone] between the hours of 8:00 am and 5:00 pm, Monday-Friday.  ? ?I appreciate the  opportunity to care for you ? ?Thank You  ? ?Lovett Calender ?  ?

## 2022-02-20 NOTE — Progress Notes (Signed)
? ?Chief Complaint: Bloating and abdominal pain ? ?HPI: ?   [name] is a 46 year old female with a past medical history of anxiety, depression, diabetes with gastroparesis, fibromyalgia and reflux, known to Dr. Hilarie Fredrickson, who was referred to me by Charlott Rakes, MD for a complaint of bloating and abdominal pain. ?   08/09/2019 patient seen in clinic for me for gastroparesis.  At that time was having hard time after moving to be with her mother in [place] and really not taking care of herself.  Described bad gas and bloating as well as nausea and dysphagia.  At that time schedule patient for repeat EGD and colonoscopy.  She was continued on Reglan 5 mg 4 times daily.  Also discussed abstaining from marijuana for a few weeks to see if this helps with her symptoms. ?   08/17/2019 EGD and colonoscopy.  Colonoscopy with one 4 mm polyp in the ascending colon and otherwise normal.  Biopsy showed tubular adenoma.  Repeat recommended in 7 years.  EGD with esophagus dilated with a 54 [place] and acute duodenitis.  Biopsies showed normal mucosa. ?   Today, the patient tells me that she was recently changed due to Trulicity for her diabetes and since then has noticed a change in bowel habits saying that they are very large and have a "terrible odor".  Tells me that she is often uncomfortable even after she passes a stool and also has some persistent left lower quadrant discomfort which seems worse recently.  Also feels like she noticed a "enlarged lymph node on that side" recently.  Does tell me that stools can vary in consistency and sometimes are very loose and watery and other times seem more solid.  Also reports an increase in reflux symptoms lately and has been more consistently taking her Omeprazole 40 mg daily which is helping. ?   Also tells me she has been having joint pains 8 days before her menstrual cycle or years.  Apparently worked up by her OB/GYN for "hormone levels", but they cannot find anything. ?    Denies fever, chills, weight loss or blood in her stool. ? ?Past Medical History:  ?Diagnosis Date  ? Allergy   ? Anxiety   ? Arthritis   ? Chronic UTI   ? Depression   ? Diabetes mellitus without complication ([place])   ? Diverticulosis   ? Fibromyalgia   ? GERD (gastroesophageal reflux disease)   ? Hiatal hernia   ? Hyperlipidemia   ? Hypertension   ? Internal hemorrhoids   ? Meningitis, viral   ? Pancreatitis   ? Pure hypercholesterolemia 12/23/2020  ? ? ?Past Surgical History:  ?Procedure Laterality Date  ? APPENDECTOMY  1990  ? ceasarian  2002  ? CESAREAN SECTION    ? x1  ? CHOLECYSTECTOMY  2011  ? COLONOSCOPY    ? UPPER GASTROINTESTINAL ENDOSCOPY    ? URINARY SURGERY    ? urethra sling, removal, and then revision 2016 (4 surgeries)  ? WISDOM TOOTH EXTRACTION    ? ? ?Current Outpatient Medications  ?Medication Sig Dispense Refill  ? acyclovir (ZOVIRAX) 400 MG tablet Take 1 tablet (400 mg total) by mouth 2 (two) times daily. 180 tablet 0  ? albuterol (PROAIR HFA) 108 (90 Base) MCG/ACT inhaler Inhale 2 puffs into the lungs every 4 (four) hours as needed for wheezing or shortness of breath. 3 Inhaler 0  ? amLODipine (NORVASC) 10 MG tablet TAKE 1 TABLET EVERY DAY (OVERDUE FOR APPOINTMENT, CALL  TO SCHEDULE FOR FURTHER REFILLS) 30 tablet 0  ? Blood Glucose Monitoring Suppl (ACCU-CHEK AVIVA PLUS) w/Device KIT USE AS DIRECTED DAILY. E11.9 1 kit 0  ? buPROPion (WELLBUTRIN SR) 150 MG 12 hr tablet Take 1 tablet (150 mg total) by mouth 2 (two) times daily. 60 tablet 0  ? Evolocumab (REPATHA SURECLICK) 989 MG/ML SOAJ Inject 140 mg into the skin every 14 (fourteen) days. (Patient not taking: Reported on 02/20/2022) 2 mL 11  ? fluconazole (DIFLUCAN) 150 MG tablet Take 1 tablet (150 mg total) by mouth daily as needed. 2 tablet 4  ? hydrOXYzine (ATARAX/VISTARIL) 25 MG tablet Take 1 tablet (25 mg total) by mouth 3 (three) times daily as needed. 270 tablet 1  ? irbesartan (AVAPRO) 150 MG tablet TAKE 1 TABLET EVERY DAY  (OVERDUE FOR   APPOINTMENT, NEED TO CALL OFFICE AND SCHEDULE TO ENSURE FURTHER REFILLS) 30 tablet 0  ? Lancet Devices (ACCU-CHEK SOFTCLIX) lancets Use as instructed daily. 1 each 5  ? meloxicam (MOBIC) 15 MG tablet TAKE 1 TABLET (15 MG TOTAL) BY MOUTH DAILY. 30 tablet 0  ? methocarbamol (ROBAXIN) 750 MG tablet Take 1 tablet (750 mg total) by mouth every 8 (eight) hours as needed for muscle spasms. 90 tablet 0  ? Multiple Vitamin (MULTI-VITAMIN DAILY) TABS Take 1 tablet by mouth daily as needed. (Patient not taking: Reported on 11/28/2021)    ? Nicotine 21-14-7 MG/24HR KIT Place 1 patch onto the skin as directed. 1 kit 0  ? nicotine polacrilex (NICORETTE) 2 MG gum Take 1 each (2 mg total) by mouth as needed for smoking cessation. 100 tablet 2  ? omeprazole (PRILOSEC) 40 MG capsule Take 1 tab every morning. 90 capsule 2  ? predniSONE (DELTASONE) 20 MG tablet Take 20 mg by mouth daily as needed.    ? pregabalin (LYRICA) 100 MG capsule Take 1 capsule (100 mg total) by mouth 2 (two) times daily. 60 capsule 3  ? TRULICITY 3 QJ/1.9ER SOPN INJECT 3 MG (0.5 ML) UNDER THE SKIN ONCE A WEEK 6 mL 0  ? Vitamin D, Ergocalciferol, (DRISDOL) 1.25 MG (50000 UNIT) CAPS capsule TAKE 1 CAPSULE BY MOUTH ONE TIME PER WEEK 4 capsule 0  ? ?No current facility-administered medications for this visit.  ? ? ?Allergies as of 02/20/2022 - Review Complete 11/28/2021  ?Allergen Reaction Noted  ? Atorvastatin Other (See Comments) 12/12/2020  ? Crestor [rosuvastatin]  01/01/2021  ? Metformin and related Nausea And Vomiting 10/12/2016  ? Metronidazole Nausea And Vomiting 08/26/2018  ? Penicillins  10/12/2016  ? Xanax [alprazolam]  10/12/2016  ? Glyburide Other (See Comments) 02/04/2015  ? Linagliptin Diarrhea and Other (See Comments) 07/18/2014  ? Moxifloxacin Diarrhea 04/10/2014  ? ? ?Family History  ?Problem Relation Age of Onset  ? Cancer Mother   ?     cervical, breast, lungm skin, bladder-ex smoker  ? Hypertension Mother   ? Diabetes Father   ? Multiple  sclerosis Paternal Grandmother   ? Heart attack Maternal Grandfather   ? Allergic rhinitis Neg Hx   ? Angioedema Neg Hx   ? Asthma Neg Hx   ? Eczema Neg Hx   ? Urticaria Neg Hx   ? Colon cancer Neg Hx   ? Esophageal cancer Neg Hx   ? Liver disease Neg Hx   ? Pancreatic cancer Neg Hx   ? Prostate cancer Neg Hx   ? Rectal cancer Neg Hx   ? Stomach cancer Neg Hx   ? ? ?Social History  ? ?  Socioeconomic History  ? Marital status: Single  ?  Spouse name: Not on file  ? Number of children: 3  ? Years of education: Not on file  ? Highest education level: Not on file  ?Occupational History  ? Not on file  ?Tobacco Use  ? Smoking status: Light Smoker  ?  Packs/day: 0.10  ?  Years: 20.00  ?  Pack years: 2.00  ?  Types: Cigarettes  ? Smokeless tobacco: Never  ? Tobacco comments:  ?  Patient is engaged in health coaching for smoking cessation as of 12/26/20  ?Vaping Use  ? Vaping Use: Never used  ?Substance and Sexual Activity  ? Alcohol use: Yes  ?  Comment: rarely  ? Drug use: Yes  ?  Types: Marijuana  ?  Comment: last smoker 11-14-17  ? Sexual activity: Never  ?  Comment: pt on her mentral cycle now began 11-14-17  ?Other Topics Concern  ? Not on file  ?Social History Narrative  ? Lives home with adult niece.  Not working.  Disability pending.  Pt is single.  Education GED.  3 children.   ? ?Social Determinants of Health  ? ?Financial Resource Strain: Not on file  ?Food Insecurity: Not on file  ?Transportation Needs: Not on file  ?Physical Activity: Not on file  ?Stress: Not on file  ?Social Connections: Not on file  ?Intimate Partner Violence: Not on file  ? ? ?Review of Systems:    ?Constitutional: No weight loss, fever or chills ?Cardiovascular: No chest pain ?Respiratory: No SOB ?Gastrointestinal: See HPI and otherwise negative ? ? Physical Exam:  ?Vital signs: ?BP 102/66   Pulse 82   Ht '5\' 3"'  (1.6 m)   Wt 190 lb (86.2 kg)   SpO2 97%   BMI 33.66 kg/m?   ? ?Constitutional:   Pleasant Caucasian female appears to be in NAD,  Well developed, Well nourished, alert and cooperative ?Respiratory: Respirations even and unlabored. Lungs clear to auscultation bilaterally.   No wheezes, crackles, or rhonchi.  ?Cardiovascular: Normal S1, S2.

## 2022-02-23 ENCOUNTER — Encounter (HOSPITAL_BASED_OUTPATIENT_CLINIC_OR_DEPARTMENT_OTHER): Payer: Self-pay

## 2022-02-24 ENCOUNTER — Encounter: Payer: Self-pay | Admitting: Family Medicine

## 2022-02-24 ENCOUNTER — Other Ambulatory Visit: Payer: Medicare HMO

## 2022-02-24 ENCOUNTER — Telehealth: Payer: Self-pay

## 2022-02-24 ENCOUNTER — Ambulatory Visit: Payer: Medicare HMO | Attending: Family Medicine | Admitting: Family Medicine

## 2022-02-24 VITALS — BP 106/71 | HR 87 | Ht 63.0 in | Wt 194.0 lb

## 2022-02-24 DIAGNOSIS — Z0389 Encounter for observation for other suspected diseases and conditions ruled out: Secondary | ICD-10-CM | POA: Diagnosis not present

## 2022-02-24 DIAGNOSIS — E1149 Type 2 diabetes mellitus with other diabetic neurological complication: Secondary | ICD-10-CM | POA: Diagnosis not present

## 2022-02-24 DIAGNOSIS — M25551 Pain in right hip: Secondary | ICD-10-CM

## 2022-02-24 DIAGNOSIS — R195 Other fecal abnormalities: Secondary | ICD-10-CM | POA: Diagnosis not present

## 2022-02-24 DIAGNOSIS — F17200 Nicotine dependence, unspecified, uncomplicated: Secondary | ICD-10-CM

## 2022-02-24 DIAGNOSIS — R194 Change in bowel habit: Secondary | ICD-10-CM

## 2022-02-24 DIAGNOSIS — G8929 Other chronic pain: Secondary | ICD-10-CM

## 2022-02-24 DIAGNOSIS — E785 Hyperlipidemia, unspecified: Secondary | ICD-10-CM | POA: Diagnosis not present

## 2022-02-24 DIAGNOSIS — E1159 Type 2 diabetes mellitus with other circulatory complications: Secondary | ICD-10-CM

## 2022-02-24 DIAGNOSIS — R1032 Left lower quadrant pain: Secondary | ICD-10-CM

## 2022-02-24 DIAGNOSIS — M79651 Pain in right thigh: Secondary | ICD-10-CM

## 2022-02-24 DIAGNOSIS — M25552 Pain in left hip: Secondary | ICD-10-CM | POA: Diagnosis not present

## 2022-02-24 DIAGNOSIS — F17210 Nicotine dependence, cigarettes, uncomplicated: Secondary | ICD-10-CM

## 2022-02-24 DIAGNOSIS — M79652 Pain in left thigh: Secondary | ICD-10-CM

## 2022-02-24 DIAGNOSIS — E1169 Type 2 diabetes mellitus with other specified complication: Secondary | ICD-10-CM

## 2022-02-24 DIAGNOSIS — M25559 Pain in unspecified hip: Secondary | ICD-10-CM

## 2022-02-24 DIAGNOSIS — I152 Hypertension secondary to endocrine disorders: Secondary | ICD-10-CM

## 2022-02-24 DIAGNOSIS — A048 Other specified bacterial intestinal infections: Secondary | ICD-10-CM | POA: Diagnosis not present

## 2022-02-24 LAB — POCT GLYCOSYLATED HEMOGLOBIN (HGB A1C): HbA1c, POC (controlled diabetic range): 6.6 % (ref 0.0–7.0)

## 2022-02-24 LAB — GLUCOSE, POCT (MANUAL RESULT ENTRY): POC Glucose: 184 mg/dl — AB (ref 70–99)

## 2022-02-24 MED ORDER — BUPROPION HCL ER (SR) 150 MG PO TB12: 150.0000 mg | ORAL_TABLET | Freq: Two times a day (BID) | ORAL | 1 refills | Status: DC

## 2022-02-24 MED ORDER — MELOXICAM 15 MG PO TABS: 15.0000 mg | ORAL_TABLET | Freq: Every day | ORAL | 1 refills | Status: DC

## 2022-02-24 MED ORDER — METHOCARBAMOL 750 MG PO TABS: 750.0000 mg | ORAL_TABLET | Freq: Three times a day (TID) | ORAL | 3 refills | Status: DC | PRN

## 2022-02-24 MED ORDER — OMEPRAZOLE 40 MG PO CPDR: DELAYED_RELEASE_CAPSULE | ORAL | 1 refills | Status: DC

## 2022-02-24 MED ORDER — TIRZEPATIDE 5 MG/0.5ML ~~LOC~~ SOAJ: 5.0000 mg | SUBCUTANEOUS | 6 refills | Status: DC

## 2022-02-24 NOTE — Progress Notes (Signed)
? ?Subjective:  ?Patient ID: Amber Rose, female    DOB: 05-24-1976  Age: 46 y.o. MRN: [id number] ? ?CC: Diabetes ? ? ?HPI ?Amber Rose is a 46 y.o. year old female with a history of type 2 diabetes mellitus (A1c 6.6), hypertension, depression and anxiety, hydradenitis, tobacco abuse, fibromyalgia seen for a follow up visit. ? ?Interval History: ?Her appetite has increased and she did not have that previously when she was on Bydureon.  She has also noticed producing large amounts of feces when she moves her bowels which she noticed since beginning Trulicity as well.  She is currently being worked up by GI for this. ?Hypertension is managed by the advanced hypertension clinic ? ?She has cut back on smoking and buys a Cigarettes as needed ? ?As soon as she got off her Statin her thigh pains improved but she has noticed debilitating pain 8 days prior to her monthly cycle and it sometimes occurs in her hands as well.  She has seen GYN with suggestions for possible IUD which she is not ready for.  Thigh pain is absent at this time. ?She sometimes has stiffness in her hips and finds it difficult to open her legs.  I had referred her for a pelvic x-ray which she never underwent.  She informs me she had seen an orthopedic who told her she would need a "double hip replacement". ?She could not tolerate Cymbalta in the past.  Lyrica and meloxicam provides some relief. ?Past Medical History:  ?Diagnosis Date  ? Allergy   ? Anxiety   ? Arthritis   ? Chronic UTI   ? Depression   ? Diabetes mellitus without complication ([place])   ? Diverticulosis   ? Fibromyalgia   ? GERD (gastroesophageal reflux disease)   ? Hiatal hernia   ? Hyperlipidemia   ? Hypertension   ? Internal hemorrhoids   ? Meningitis, viral   ? Pancreatitis   ? Pure hypercholesterolemia 12/23/2020  ? ? ?Past Surgical History:  ?Procedure Laterality Date  ? APPENDECTOMY  1990  ? ceasarian  2002  ? CESAREAN SECTION    ? x1  ? CHOLECYSTECTOMY  2011  ?  COLONOSCOPY    ? UPPER GASTROINTESTINAL ENDOSCOPY    ? URINARY SURGERY    ? urethra sling, removal, and then revision 2016 (4 surgeries)  ? WISDOM TOOTH EXTRACTION    ? ? ?Family History  ?Problem Relation Age of Onset  ? Cancer Mother   ?     cervical, breast, lungm skin, bladder-ex smoker  ? Hypertension Mother   ? Diabetes Father   ? Heart attack Maternal Grandfather   ? Multiple sclerosis Paternal Grandmother   ? Allergic rhinitis Neg Hx   ? Angioedema Neg Hx   ? Asthma Neg Hx   ? Eczema Neg Hx   ? Urticaria Neg Hx   ? Colon cancer Neg Hx   ? Esophageal cancer Neg Hx   ? Liver disease Neg Hx   ? Pancreatic cancer Neg Hx   ? Prostate cancer Neg Hx   ? Rectal cancer Neg Hx   ? Stomach cancer Neg Hx   ? Colon polyps Neg Hx   ? ? ?Social History  ? ?Socioeconomic History  ? Marital status: Single  ?  Spouse name: Not on file  ? Number of children: 3  ? Years of education: Not on file  ? Highest education level: Not on file  ?Occupational History  ? Not on file  ?Tobacco Use  ?  Smoking status: Every Day  ?  Packs/day: 0.10  ?  Years: 20.00  ?  Pack years: 2.00  ?  Types: Cigarettes  ? Smokeless tobacco: Never  ? Tobacco comments:  ?  Patient is engaged in health coaching for smoking cessation as of 12/26/20  ?Vaping Use  ? Vaping Use: Never used  ?Substance and Sexual Activity  ? Alcohol use: Yes  ?  Comment: occ  ? Drug use: Not Currently  ?  Types: Marijuana  ? Sexual activity: Never  ?  Comment: pt on her mentral cycle now began 11-14-17  ?Other Topics Concern  ? Not on file  ?Social History Narrative  ? Lives home with adult niece.  Not working.  Disability pending.  Pt is single.  Education GED.  3 children.   ? ?Social Determinants of Health  ? ?Financial Resource Strain: Not on file  ?Food Insecurity: Not on file  ?Transportation Needs: Not on file  ?Physical Activity: Not on file  ?Stress: Not on file  ?Social Connections: Not on file  ? ? ?Allergies  ?Allergen Reactions  ? Atorvastatin Other (See Comments)  ?    Joint pain  ? Crestor [Rosuvastatin]   ?  Joint pain   ? Metformin And Related Nausea And Vomiting  ? Metronidazole Nausea And Vomiting  ? Penicillins   ?  childhood  ? Xanax [Alprazolam]   ?  "Caused upper respiratory symptoms" per pt  ? Glyburide Other (See Comments)  ?  "blood sugar dropped uncontrollably"  ? Linagliptin Diarrhea and Other (See Comments)  ?  Stomach pain, sinus infection  ? Moxifloxacin Diarrhea  ? ? ?Outpatient Medications Prior to Visit  ?Medication Sig Dispense Refill  ? acyclovir (ZOVIRAX) 400 MG tablet Take 1 tablet (400 mg total) by mouth 2 (two) times daily. 180 tablet 0  ? albuterol (PROAIR HFA) 108 (90 Base) MCG/ACT inhaler Inhale 2 puffs into the lungs every 4 (four) hours as needed for wheezing or shortness of breath. 3 Inhaler 0  ? amLODipine (NORVASC) 10 MG tablet TAKE 1 TABLET EVERY DAY (OVERDUE FOR APPOINTMENT, CALL TO SCHEDULE FOR FURTHER REFILLS) 30 tablet 0  ? Blood Glucose Monitoring Suppl (ACCU-CHEK AVIVA PLUS) w/Device KIT USE AS DIRECTED DAILY. E11.9 1 kit 0  ? Cholecalciferol (VITAMIN D) 125 MCG (5000 UT) CAPS Take 1 capsule by mouth daily at 2 PM.    ? Evolocumab (REPATHA SURECLICK) 938 MG/ML SOAJ Inject 140 mg into the skin every 14 (fourteen) days. 2 mL 11  ? fluconazole (DIFLUCAN) 150 MG tablet Take 1 tablet (150 mg total) by mouth daily as needed. 2 tablet 4  ? hydrOXYzine (ATARAX/VISTARIL) 25 MG tablet Take 1 tablet (25 mg total) by mouth 3 (three) times daily as needed. 270 tablet 1  ? hyoscyamine (LEVSIN SL) 0.125 MG SL tablet Place 1 tablet (0.125 mg total) under the tongue every 4 (four) hours as needed (every 4-6 hours). 30 tablet 2  ? irbesartan (AVAPRO) 150 MG tablet TAKE 1 TABLET EVERY DAY  (OVERDUE FOR  APPOINTMENT, NEED TO CALL OFFICE AND SCHEDULE TO ENSURE FURTHER REFILLS) 30 tablet 0  ? Lancet Devices (ACCU-CHEK SOFTCLIX) lancets Use as instructed daily. 1 each 5  ? Multiple Vitamin (MULTI-VITAMIN DAILY) TABS Take 1 tablet by mouth daily as needed.    ?  Nicotine 21-14-7 MG/24HR KIT Place 1 patch onto the skin as directed. 1 kit 0  ? nicotine polacrilex (NICORETTE) 2 MG gum Take 1 each (2 mg total) by  mouth as needed for smoking cessation. 100 tablet 2  ? predniSONE (DELTASONE) 20 MG tablet Take 20 mg by mouth daily as needed.    ? pregabalin (LYRICA) 100 MG capsule Take 1 capsule (100 mg total) by mouth 2 (two) times daily. 60 capsule 3  ? buPROPion (WELLBUTRIN SR) 150 MG 12 hr tablet Take 1 tablet (150 mg total) by mouth 2 (two) times daily. 60 tablet 0  ? meloxicam (MOBIC) 15 MG tablet TAKE 1 TABLET (15 MG TOTAL) BY MOUTH DAILY. 30 tablet 0  ? methocarbamol (ROBAXIN) 750 MG tablet Take 1 tablet (750 mg total) by mouth every 8 (eight) hours as needed for muscle spasms. 90 tablet 0  ? omeprazole (PRILOSEC) 40 MG capsule Take 1 tab every morning. 90 capsule 2  ? TRULICITY 3 OU/5.1QU SOPN INJECT 3 MG (0.5 ML) UNDER THE SKIN ONCE A WEEK 6 mL 0  ? ?No facility-administered medications prior to visit.  ? ? ? ?ROS ?Review of Systems  ?Constitutional:  Negative for activity change, appetite change and fatigue.  ?HENT:  Negative for congestion, sinus pressure and sore throat.   ?Eyes:  Negative for visual disturbance.  ?Respiratory:  Negative for cough, chest tightness, shortness of breath and wheezing.   ?Cardiovascular:  Negative for chest pain and palpitations.  ?Gastrointestinal:  Negative for abdominal distention, abdominal pain and constipation.  ?Endocrine: Negative for polydipsia.  ?Genitourinary:  Negative for dysuria and frequency.  ?Musculoskeletal:   ?     See HPI  ?Skin:  Negative for rash.  ?Neurological:  Negative for tremors, light-headedness and numbness.  ?Hematological:  Does not bruise/bleed easily.  ?Psychiatric/Behavioral:  Negative for agitation and behavioral problems.   ? ?Objective:  ?BP 106/71   Pulse 87   Ht '5\' 3"'  (1.6 m)   Wt 194 lb (88 kg)   SpO2 97%   BMI 34.37 kg/m?  ? ? ?  02/24/2022  ?  2:28 PM 02/20/2022  ?  9:04 AM 11/28/2021  ?  9:59 AM   ?BP/Weight  ?Systolic BP 047 998 721  ?Diastolic BP 71 66 89  ?Wt. (Lbs) 194 190 191.9  ?BMI 34.37 kg/m2 33.66 kg/m2 33.99 kg/m2  ? ? ? ? ?Physical Exam ?Constitutional:   ?   Appearance: She is well-developed.

## 2022-02-24 NOTE — Patient Instructions (Signed)

## 2022-02-24 NOTE — Telephone Encounter (Signed)
Fax has came from patient's pharmacy for clarification on how she should be taking the hyoscyamine: every 4 hours or every 4-6 hours? ?

## 2022-02-24 NOTE — Progress Notes (Signed)
Discuss diabetes medication ?Discuss increased appetite. ?

## 2022-02-25 ENCOUNTER — Encounter: Payer: Self-pay | Admitting: Family Medicine

## 2022-02-25 LAB — FECAL LACTOFERRIN, QUANT
Fecal Lactoferrin: NEGATIVE
MICRO NUMBER:: 13340038
SPECIMEN QUALITY:: ADEQUATE

## 2022-02-25 MED ORDER — HYOSCYAMINE SULFATE 0.125 MG SL SUBL: SUBLINGUAL_TABLET | SUBLINGUAL | 2 refills | Status: DC

## 2022-02-25 NOTE — Telephone Encounter (Signed)
Corrected directions have been sent to patient's pharmacy. ?

## 2022-02-26 MED ORDER — REPATHA SURECLICK 140 MG/ML ~~LOC~~ SOAJ: 140.0000 mg | SUBCUTANEOUS | 11 refills | Status: DC

## 2022-02-26 NOTE — Progress Notes (Signed)
Addendum: Reviewed and agree with assessment and management plan. Charles Niese M, MD  

## 2022-02-27 ENCOUNTER — Other Ambulatory Visit: Payer: Self-pay

## 2022-02-27 ENCOUNTER — Telehealth: Payer: Self-pay | Admitting: Internal Medicine

## 2022-02-27 LAB — GI PROFILE, STOOL, PCR

## 2022-02-27 MED ORDER — VANCOMYCIN HCL 125 MG PO CAPS: 125.0000 mg | ORAL_CAPSULE | Freq: Four times a day (QID) | ORAL | 0 refills | Status: AC

## 2022-02-27 NOTE — Telephone Encounter (Signed)
+   C diff on GI PCR test ? ?Vancomycin Rx - prior auth needed, over $1000 ? ?She has severe nausea and vomiting from metronidazole so cannot use ? ?She is having diarrhea but not in extreme distress ? ?Advised we would work on prior auth early Monday AM ? ? ? ? ?

## 2022-03-01 LAB — PANCREATIC ELASTASE, FECAL: Pancreatic Elastase-1, Stool: >500 mcg/g

## 2022-03-01 NOTE — Telephone Encounter (Signed)
C diff + ?Needs oral vancomycin 125 mg QID x 7 days (she has an intolerance to metronidazole but this isn't the preferred agent anyway) ?Florastor 250 mg BID x 2-3 weeks ? ?Please expedite  ?Thanks ?JMP ? ?

## 2022-03-02 ENCOUNTER — Telehealth: Payer: Self-pay

## 2022-03-02 ENCOUNTER — Ambulatory Visit (HOSPITAL_COMMUNITY)
Admission: RE | Admit: 2022-03-02 | Discharge: 2022-03-02 | Disposition: A | Discharge status: Home / Self Care | Payer: Medicare HMO | Source: Ambulatory Visit | Attending: Physician Assistant | Admitting: Physician Assistant

## 2022-03-02 ENCOUNTER — Encounter: Payer: Self-pay | Admitting: *Deleted

## 2022-03-02 DIAGNOSIS — R1032 Left lower quadrant pain: Secondary | ICD-10-CM | POA: Diagnosis not present

## 2022-03-02 DIAGNOSIS — R195 Other fecal abnormalities: Secondary | ICD-10-CM | POA: Diagnosis not present

## 2022-03-02 DIAGNOSIS — R194 Change in bowel habit: Secondary | ICD-10-CM | POA: Insufficient documentation

## 2022-03-02 MED ORDER — SODIUM CHLORIDE (PF) 0.9 % IJ SOLN
INTRAMUSCULAR | Status: AC
  Filled 2022-03-02: qty 50

## 2022-03-02 MED ORDER — IOHEXOL 300 MG/ML  SOLN
100.0000 mL | Freq: Once | INTRAMUSCULAR | Status: AC | PRN
  Administered 2022-03-02: 75 mL via INTRAVENOUS

## 2022-03-02 NOTE — Telephone Encounter (Signed)
Dottie I sent this to you as it needs a prior auth and is not a biologic. Does it need to go to a different CMA? ?

## 2022-03-02 NOTE — Telephone Encounter (Signed)
Amber Rose Key: Marcy Salvo - PA Case ID: [id number] Need help? Call us at [phone] ?Outcome ?Approvedtoday ?PA Case: [id number], Status: Approved, Coverage Starts on: 10/26/2021 12:00:00 AM, Coverage Ends on: 10/25/2022 12:00:00 AM. Questions? Contact [phone]. ?Drug ?Vancomycin HCl '125MG'$  capsules ?Form ?[employer] Electronic PA Form ?

## 2022-03-02 NOTE — Telephone Encounter (Signed)
PA was approved for Vancomycin for patient. Cost will be $95 for the patient. Pt was advised by Carla Drape, CMA.  ?See 02/27/22 telephone encounter for details. ?

## 2022-03-02 NOTE — Telephone Encounter (Signed)
Received MyChart message from patient below. ? ?"Hello, the medicine you prescribed is not being covered by my insurance. The current cost is over $1000. The pharmacist mentioned a couple other drugs, one of them being Flagyl, which I cannot take." ? ?We recently prescribed Vancomycin due to C. Diff + stool study. Amber Rose is out of the office this week. Please advise on alternative treatment for patient. Thanks ?

## 2022-03-02 NOTE — Telephone Encounter (Signed)
I had previously sent other communication last night after reviewing from Dr. Carlean Purl to [place] regarding vancomycin ?Ideally she needs liquid or capsule oral vancomycin 125 mg 4 times daily x7 days ?If no way for this to be covered then we could try Dificid 200 mg twice daily x10 days ?She cannot take Flagyl due to intolerance, and this is not the ideal therapy anyway. ?Consider the addition of Florastor 250 mg twice daily x2 to 3 weeks ?

## 2022-03-02 NOTE — Telephone Encounter (Signed)
Telephone encounter created. Will send pt a MyChart message with provider recommendations. ?

## 2022-03-02 NOTE — Telephone Encounter (Signed)
Rx is now 95 dollars at the pharmacy. Patient advised. ?

## 2022-03-02 NOTE — Telephone Encounter (Signed)
PA request has been sent to [facility] via [email or web address] for this patient. ?

## 2022-03-03 ENCOUNTER — Encounter: Payer: Self-pay | Admitting: Family Medicine

## 2022-03-09 ENCOUNTER — Other Ambulatory Visit: Payer: Self-pay

## 2022-03-11 DIAGNOSIS — A498 Other bacterial infections of unspecified site: Secondary | ICD-10-CM

## 2022-03-13 MED ORDER — VANCOMYCIN HCL 125 MG PO CAPS: ORAL_CAPSULE | ORAL | 0 refills | Status: DC

## 2022-03-13 NOTE — Addendum Note (Signed)
Addended by: Yevette Edwards on: 03/13/2022 12:06 PM   Modules accepted: Orders

## 2022-03-14 ENCOUNTER — Other Ambulatory Visit: Payer: Self-pay | Admitting: Family Medicine

## 2022-03-16 NOTE — Telephone Encounter (Signed)
Requested medication (s) are due for refill today:   No  Requested medication (s) are on the active medication list:   No  Discontinued 02/24/2022  Future visit scheduled:   No   Seen 2 wks ago   Last ordered: 02/24/2022 discontinued.  Returned for review    Requested Prescriptions  Pending Prescriptions Disp Refills   TRULICITY 3 EX/5.1ZG SOPN [Pharmacy Med Name: Trulicity Subcutaneous Solution Pen-injector 3 MG/0.5ML] 6 mL 0    Sig: INJECT 3 MG (0.5 ML) UNDER THE SKIN ONCE A WEEK     Endocrinology:  Diabetes - GLP-1 Receptor Agonists Passed - 03/14/2022  8:51 AM      Passed - HBA1C is between 0 and 7.9 and within 180 days    HbA1c, POC (controlled diabetic range)  Date Value Ref Range Status  02/24/2022 6.6 0.0 - 7.0 % Final         Passed - Valid encounter within last 6 months    Recent Outpatient Visits           2 weeks ago Type 2 diabetes mellitus with other neurologic complication, without long-term current use of insulin ([place])   [place], [place], MD   6 months ago Type 2 diabetes mellitus with other neurologic complication, without long-term current use of insulin ([place])   [place], [place], MD   8 months ago Acute cystitis without hematuria   [place], [place], FNP   9 months ago Type 2 diabetes mellitus with other neurologic complication, without long-term current use of insulin ([place])   [place], [place]ong, MD   11 months ago Medicare annual wellness visit, subsequent   [place], [place] Sage, [place]

## 2022-03-18 MED ORDER — SACCHAROMYCES BOULARDII 250 MG PO CAPS: 250.0000 mg | ORAL_CAPSULE | Freq: Two times a day (BID) | ORAL | 0 refills | Status: DC

## 2022-03-18 NOTE — Telephone Encounter (Signed)
I would stop the vancomycin  I would have her submit a C. difficile PCR separately.  This is more accurate  I would have her use Florastor 250 mg twice daily   We can determine additional treatments based on the PCR C. Difficile  Lajuan Lines. Pyrtle, M.D.  03/18/2022

## 2022-03-18 NOTE — Addendum Note (Signed)
Addended by: Yevette Edwards on: 03/18/2022 04:54 PM   Modules accepted: Orders

## 2022-03-18 NOTE — Telephone Encounter (Signed)
Called and spoke with patient regarding Dr. Vena Rua recommendations. Pt knows to continue to follow C. Diff precautions. Pt is aware that we will have further recommendations once her stool results return. Pt verbalized understanding and had no concerns at the end of the call.  C. DIFF PCR order in epic.

## 2022-03-19 ENCOUNTER — Other Ambulatory Visit: Payer: Medicare HMO

## 2022-03-19 DIAGNOSIS — A498 Other bacterial infections of unspecified site: Secondary | ICD-10-CM

## 2022-03-21 LAB — CLOSTRIDIUM DIFFICILE BY PCR: Toxigenic C. Difficile by PCR: NEGATIVE

## 2022-03-25 DIAGNOSIS — H04123 Dry eye syndrome of bilateral lacrimal glands: Secondary | ICD-10-CM | POA: Diagnosis not present

## 2022-03-25 DIAGNOSIS — H10413 Chronic giant papillary conjunctivitis, bilateral: Secondary | ICD-10-CM | POA: Diagnosis not present

## 2022-03-25 DIAGNOSIS — E119 Type 2 diabetes mellitus without complications: Secondary | ICD-10-CM | POA: Diagnosis not present

## 2022-03-25 DIAGNOSIS — H2513 Age-related nuclear cataract, bilateral: Secondary | ICD-10-CM | POA: Diagnosis not present

## 2022-03-25 LAB — HM DIABETES EYE EXAM

## 2022-03-30 ENCOUNTER — Other Ambulatory Visit: Payer: Self-pay | Admitting: Family Medicine

## 2022-03-30 ENCOUNTER — Encounter: Payer: Self-pay | Admitting: Family Medicine

## 2022-03-30 MED ORDER — TIRZEPATIDE 7.5 MG/0.5ML ~~LOC~~ SOAJ: 7.5000 mg | SUBCUTANEOUS | 3 refills | Status: DC

## 2022-04-07 ENCOUNTER — Encounter: Payer: Self-pay | Admitting: Family Medicine

## 2022-04-08 ENCOUNTER — Other Ambulatory Visit: Payer: Self-pay

## 2022-04-08 ENCOUNTER — Other Ambulatory Visit: Payer: Self-pay | Admitting: Family Medicine

## 2022-04-08 MED ORDER — SEMAGLUTIDE (1 MG/DOSE) 4 MG/3ML ~~LOC~~ SOPN
1.0000 mg | PEN_INJECTOR | SUBCUTANEOUS | 6 refills | Status: DC
Start: 2022-04-08 — End: 2022-09-23
  Filled 2022-04-08 – 2022-04-30 (×3): qty 3, 28d supply, fill #0
  Filled 2022-06-04: qty 12, 112d supply, fill #0
  Filled 2022-09-22: qty 3, 28d supply, fill #1

## 2022-04-10 ENCOUNTER — Encounter: Payer: Self-pay | Admitting: Family Medicine

## 2022-04-10 ENCOUNTER — Other Ambulatory Visit: Payer: Self-pay

## 2022-04-13 ENCOUNTER — Other Ambulatory Visit: Payer: Self-pay

## 2022-04-15 ENCOUNTER — Other Ambulatory Visit: Payer: Self-pay

## 2022-04-17 ENCOUNTER — Other Ambulatory Visit: Payer: Self-pay

## 2022-04-21 ENCOUNTER — Telehealth: Payer: Self-pay

## 2022-04-21 NOTE — Telephone Encounter (Signed)
Called pt and did let her know that she has already had a1c last month and is not in need of one as of now

## 2022-04-29 ENCOUNTER — Other Ambulatory Visit: Payer: Self-pay

## 2022-04-30 ENCOUNTER — Encounter: Payer: Self-pay | Admitting: Family Medicine

## 2022-04-30 ENCOUNTER — Other Ambulatory Visit: Payer: Self-pay

## 2022-05-01 ENCOUNTER — Other Ambulatory Visit: Payer: Self-pay

## 2022-05-04 ENCOUNTER — Other Ambulatory Visit: Payer: Self-pay | Admitting: Family Medicine

## 2022-05-04 ENCOUNTER — Encounter: Payer: Self-pay | Admitting: Family Medicine

## 2022-05-04 DIAGNOSIS — M79651 Pain in right thigh: Secondary | ICD-10-CM

## 2022-05-04 DIAGNOSIS — M25551 Pain in right hip: Secondary | ICD-10-CM

## 2022-05-04 DIAGNOSIS — M25552 Pain in left hip: Secondary | ICD-10-CM

## 2022-05-05 NOTE — Telephone Encounter (Signed)
Requested medication (s) are due for refill today: no  Requested medication (s) are on the active medication list: yes  Last refill:  02/24/22  Future visit scheduled: yes  Notes to clinic:  Unable to refill per protocol due to request is too soon. Last refill 02/24/22 for 90, and 1 refill.     Requested Prescriptions  Pending Prescriptions Disp Refills   meloxicam (MOBIC) 15 MG tablet [Pharmacy Med Name: MELOXICAM 15 MG TABLET] 30 tablet     Sig: Take 1 tablet (15 mg total) by mouth daily.     Analgesics:  COX2 Inhibitors Failed - 05/04/2022  9:50 AM      Failed - Manual Review: Labs are only required if the patient has taken medication for more than 8 weeks.      Passed - HGB in normal range and within 360 days    Hemoglobin  Date Value Ref Range Status  02/20/2022 12.7 12.0 - 15.0 g/dL Final  06/01/2019 11.8 11.1 - 15.9 g/dL Final         Passed - Cr in normal range and within 360 days    Creat  Date Value Ref Range Status  10/28/2016 0.64 0.50 - 1.10 mg/dL Final   Creatinine, Ser  Date Value Ref Range Status  02/20/2022 0.64 0.40 - 1.20 mg/dL Final   Creatinine, Urine  Date Value Ref Range Status  10/28/2016 158 20 - 320 mg/dL Final         Passed - HCT in normal range and within 360 days    HCT  Date Value Ref Range Status  02/20/2022 38.7 36.0 - 46.0 % Final   Hematocrit  Date Value Ref Range Status  06/01/2019 37.8 34.0 - 46.6 % Final         Passed - AST in normal range and within 360 days    AST  Date Value Ref Range Status  02/20/2022 13 0 - 37 U/L Final         Passed - ALT in normal range and within 360 days    ALT  Date Value Ref Range Status  02/20/2022 13 0 - 35 U/L Final         Passed - eGFR is 30 or above and within 360 days    GFR, Est African American  Date Value Ref Range Status  10/28/2016 >89 >=60 mL/min Final   GFR calc Af Amer  Date Value Ref Range Status  12/12/2020 127 >59 mL/min/1.73 Final    Comment:    **In accordance  with recommendations from the NKF-ASN Task force,**   [facility] is in the process of updating its eGFR calculation to the   2021 CKD-EPI creatinine equation that estimates kidney function   without a race variable.    GFR, Est Non African American  Date Value Ref Range Status  10/28/2016 >89 >=60 mL/min Final   GFR calc non Af Amer  Date Value Ref Range Status  12/12/2020 110 >59 mL/min/1.73 Final   GFR  Date Value Ref Range Status  02/20/2022 106.09 >60.00 mL/min Final    Comment:    Calculated using the CKD-EPI Creatinine Equation (2021)   eGFR  Date Value Ref Range Status  09/17/2021 113 >59 mL/min/1.73 Final         Passed - Patient is not pregnant      Passed - Valid encounter within last 12 months    Recent Outpatient Visits  2 months ago Type 2 diabetes mellitus with other neurologic complication, without long-term current use of insulin ([place])   [place], [place], MD   7 months ago Type 2 diabetes mellitus with other neurologic complication, without long-term current use of insulin ([place])   [place], Charlane Ferretti, MD   10 months ago Acute cystitis without hematuria   [place], Broadus John, FNP   11 months ago Type 2 diabetes mellitus with other neurologic complication, without long-term current use of insulin [facility])   [facility] Charlott Rakes, MD   1 year ago Medicare annual wellness visit, subsequent   [place], [place]

## 2022-05-08 ENCOUNTER — Other Ambulatory Visit: Payer: Self-pay

## 2022-05-12 ENCOUNTER — Other Ambulatory Visit: Payer: Self-pay

## 2022-05-12 ENCOUNTER — Encounter: Payer: Self-pay | Admitting: Family Medicine

## 2022-05-12 ENCOUNTER — Other Ambulatory Visit: Payer: Self-pay | Admitting: Family Medicine

## 2022-05-12 DIAGNOSIS — Z8619 Personal history of other infectious and parasitic diseases: Secondary | ICD-10-CM

## 2022-05-12 DIAGNOSIS — R202 Paresthesia of skin: Secondary | ICD-10-CM

## 2022-05-12 DIAGNOSIS — A498 Other bacterial infections of unspecified site: Secondary | ICD-10-CM

## 2022-05-12 DIAGNOSIS — R195 Other fecal abnormalities: Secondary | ICD-10-CM

## 2022-05-12 DIAGNOSIS — R194 Change in bowel habit: Secondary | ICD-10-CM

## 2022-05-12 DIAGNOSIS — M79651 Pain in right thigh: Secondary | ICD-10-CM

## 2022-05-12 DIAGNOSIS — M25559 Pain in unspecified hip: Secondary | ICD-10-CM

## 2022-05-12 MED ORDER — ACYCLOVIR 400 MG PO TABS: 400.0000 mg | ORAL_TABLET | Freq: Two times a day (BID) | ORAL | 0 refills | Status: DC

## 2022-05-12 MED ORDER — SACCHAROMYCES BOULARDII 250 MG PO CAPS: 250.0000 mg | ORAL_CAPSULE | Freq: Two times a day (BID) | ORAL | 0 refills | Status: AC

## 2022-05-12 MED ORDER — PREGABALIN 100 MG PO CAPS: 100.0000 mg | ORAL_CAPSULE | Freq: Two times a day (BID) | ORAL | 3 refills | Status: DC

## 2022-05-12 NOTE — Progress Notes (Deleted)
05/12/2022 Calleen Alvis [id number] [birth year]  Referring provider: Charlott Rakes, MD Primary GI doctor: Dr. Hilarie Fredrickson  ASSESSMENT AND PLAN:   There are no diagnoses linked to this encounter.  History of Present Illness:  46 y.o. female  with a past medical history of anxiety, depression, diabetes with gastroparesis, fibromyalgia, reflux and others listed below, returns to clinic today for evaluation of ***.  02/20/2022 office visit with Ellouise Newer, PA for abdominal discomfort and bloating. Normal pancreatic elastase 03/02/2022 CT abdomen pelvis with contrast no bowel obstruction, 2 cm left ovarian corpus luteal, moderate stool throughout the colon. Patient had positive C. Difficile, sent in vancomycin for 7 days Patient called continuing to have diarrhea 03/11/2022, had negative PCR C. Difficile   08/17/2019 EGD and colonoscopy.   Colonoscopy with one 4 mm polyp in the ascending colon and otherwise normal.  Biopsy showed tubular adenoma.  Repeat recommended in 7 years.  (07/2026)  EGD with esophagus dilated with a 67 [place] Maloney and acute duodenitis.  Biopsies showed normal mucosa.     Current Medications:   Current Outpatient Medications (Endocrine & Metabolic):    predniSONE (DELTASONE) 20 MG tablet, Take 20 mg by mouth daily as needed.   Semaglutide, 1 MG/DOSE, 4 MG/3ML SOPN, Inject 1 mg into the skin once a week as directed.  Current Outpatient Medications (Cardiovascular):    amLODipine (NORVASC) 10 MG tablet, TAKE 1 TABLET EVERY DAY (OVERDUE FOR APPOINTMENT, CALL TO SCHEDULE FOR FURTHER REFILLS)   Evolocumab (REPATHA SURECLICK) 315 MG/ML SOAJ, Inject 140 mg into the skin every 14 (fourteen) days.   irbesartan (AVAPRO) 150 MG tablet, TAKE 1 TABLET EVERY DAY  (OVERDUE FOR  APPOINTMENT, NEED TO CALL OFFICE AND SCHEDULE TO ENSURE FURTHER REFILLS)  Current Outpatient Medications (Respiratory):    albuterol (PROAIR HFA) 108 (90 Base) MCG/ACT inhaler, Inhale 2  puffs into the lungs every 4 (four) hours as needed for wheezing or shortness of breath.  Current Outpatient Medications (Analgesics):    meloxicam (MOBIC) 15 MG tablet, TAKE 1 TABLET (15 MG TOTAL) BY MOUTH DAILY.   Current Outpatient Medications (Other):    acyclovir (ZOVIRAX) 400 MG tablet, Take 1 tablet (400 mg total) by mouth 2 (two) times daily.   Blood Glucose Monitoring Suppl (ACCU-CHEK AVIVA PLUS) w/Device KIT, USE AS DIRECTED DAILY. E11.9   buPROPion (WELLBUTRIN SR) 150 MG 12 hr tablet, Take 1 tablet (150 mg total) by mouth 2 (two) times daily.   Cholecalciferol (VITAMIN D) 125 MCG (5000 UT) CAPS, Take 1 capsule by mouth daily at 2 PM.   fluconazole (DIFLUCAN) 150 MG tablet, Take 1 tablet (150 mg total) by mouth daily as needed.   hydrOXYzine (ATARAX/VISTARIL) 25 MG tablet, Take 1 tablet (25 mg total) by mouth 3 (three) times daily as needed.   hyoscyamine (LEVSIN SL) 0.125 MG SL tablet, 1 tablet sublingual every 4-6 hours   Lancet Devices (ACCU-CHEK SOFTCLIX) lancets, Use as instructed daily.   methocarbamol (ROBAXIN) 750 MG tablet, Take 1 tablet (750 mg total) by mouth every 8 (eight) hours as needed for muscle spasms.   Multiple Vitamin (MULTI-VITAMIN DAILY) TABS, Take 1 tablet by mouth daily as needed.   Nicotine 21-14-7 MG/24HR KIT, Place 1 patch onto the skin as directed.   nicotine polacrilex (NICORETTE) 2 MG gum, Take 1 each (2 mg total) by mouth as needed for smoking cessation.   omeprazole (PRILOSEC) 40 MG capsule, Take 1 tab every morning.   pregabalin (LYRICA) 100 MG capsule, Take 1 capsule (  100 mg total) by mouth 2 (two) times daily.   saccharomyces boulardii (FLORASTOR) 250 MG capsule, Take 1 capsule (250 mg total) by mouth 2 (two) times daily.  Surgical History:  She  has a past surgical history that includes Cholecystectomy (2011); Appendectomy (1990); ceasarian (2002); Urinary surgery; Cesarean section; Colonoscopy; Upper gastrointestinal endoscopy; and Wisdom tooth  extraction. Family History:  Her family history includes Cancer in her mother; Diabetes in her father; Heart attack in her maternal grandfather; Hypertension in her mother; Multiple sclerosis in her paternal grandmother. Social History:   reports that she has been smoking cigarettes. She has a 2.00 pack-year smoking history. She has never used smokeless tobacco. She reports current alcohol use. She reports that she does not currently use drugs after having used the following drugs: Marijuana.  Current Medications, Allergies, Past Medical History, Past Surgical History, Family History and Social History were reviewed in [employer] record.  Physical Exam: There were no vitals taken for this visit. General:   Pleasant, well developed female in no acute distress Heart : Regular rate and rhythm; no murmurs Pulm: Clear anteriorly; no wheezing Abdomen:  {BlankSingle:19197::"Distended","Ridged","Soft"}, {BlankSingle:19197::"Flat","Obese","Non-distended"} AB, {BlankSingle:19197::"Absent","Hyperactive, tinkling","Hypoactive","Sluggish","Active"} bowel sounds. {actendernessAB:27319} tenderness {anatomy; site abdomen:5010}. {BlankMultiple:19196::"Without guarding","With guarding","Without rebound","With rebound"}, No organomegaly appreciated. Rectal: {acrectalexam:27461} Extremities:  {With/without:5700}  edema. Neurologic:  Alert and  oriented x4;  No focal deficits.  Psych:  Cooperative. Normal mood and affect.   Vladimir Crofts, PA-C 05/12/22

## 2022-05-12 NOTE — Telephone Encounter (Signed)
Stool study order in epic. Patient notified of recommendations via [place].

## 2022-05-13 ENCOUNTER — Other Ambulatory Visit: Payer: Medicare HMO

## 2022-05-13 DIAGNOSIS — Z8619 Personal history of other infectious and parasitic diseases: Secondary | ICD-10-CM

## 2022-05-13 DIAGNOSIS — R194 Change in bowel habit: Secondary | ICD-10-CM

## 2022-05-13 DIAGNOSIS — R195 Other fecal abnormalities: Secondary | ICD-10-CM

## 2022-05-14 ENCOUNTER — Ambulatory Visit: Payer: Medicare HMO | Admitting: Physician Assistant

## 2022-05-15 LAB — GI PROFILE, STOOL, PCR

## 2022-05-18 ENCOUNTER — Other Ambulatory Visit: Payer: Self-pay

## 2022-05-18 MED ORDER — VANCOMYCIN HCL 125 MG PO CAPS: ORAL_CAPSULE | ORAL | 0 refills | Status: AC

## 2022-05-19 ENCOUNTER — Ambulatory Visit: Payer: Medicare HMO | Admitting: Physician Assistant

## 2022-05-21 ENCOUNTER — Ambulatory Visit: Payer: Medicare HMO | Admitting: Physician Assistant

## 2022-06-04 ENCOUNTER — Telehealth: Payer: Self-pay

## 2022-06-04 ENCOUNTER — Other Ambulatory Visit: Payer: Self-pay

## 2022-06-04 NOTE — Telephone Encounter (Signed)
Thanks for the update

## 2022-06-04 NOTE — Telephone Encounter (Signed)
Ozempic patient assistance meds from [place] has been received and 4 month supply has been shipped to patient's home address today per patient's request.

## 2022-06-05 ENCOUNTER — Encounter: Payer: Self-pay | Admitting: Family Medicine

## 2022-06-08 DIAGNOSIS — Z8619 Personal history of other infectious and parasitic diseases: Secondary | ICD-10-CM

## 2022-06-09 ENCOUNTER — Encounter (HOSPITAL_BASED_OUTPATIENT_CLINIC_OR_DEPARTMENT_OTHER): Payer: Medicare HMO | Admitting: Family Medicine

## 2022-06-09 DIAGNOSIS — B3731 Acute candidiasis of vulva and vagina: Secondary | ICD-10-CM | POA: Diagnosis not present

## 2022-06-09 MED ORDER — FLUCONAZOLE 150 MG PO TABS: 150.0000 mg | ORAL_TABLET | Freq: Once | ORAL | 1 refills | Status: AC

## 2022-06-09 MED ORDER — METRONIDAZOLE 0.75 % VA GEL: 1.0000 | Freq: Every day | VAGINAL | 0 refills | Status: DC

## 2022-06-09 NOTE — Telephone Encounter (Signed)
Repeat c diff pcr test Florastor 250 mg bid x 1 month May need fidaxomycin or rifaximin depending on result

## 2022-06-09 NOTE — Telephone Encounter (Signed)
Please see the MyChart message reply(ies) for my assessment and plan.    This patient gave consent for this Medical Advice Message and is aware that it may result in a bill to [employer], as well as the possibility of receiving a bill for a co-payment or deductible. They are an established patient, but are not seeking medical advice exclusively about a problem treated during an in person or video visit in the last seven days. I did not recommend an in person or video visit within seven days of my reply.    I spent a total of 6 minutes cumulative time within 7 days through [employer].  Charlott Rakes, MD

## 2022-06-09 NOTE — Telephone Encounter (Signed)
Lm on vm for patient to return call. I also told her that I will send her a message on MyChart.

## 2022-06-10 ENCOUNTER — Other Ambulatory Visit: Payer: Medicare HMO

## 2022-06-11 ENCOUNTER — Other Ambulatory Visit: Payer: Medicare HMO

## 2022-06-11 DIAGNOSIS — Z8619 Personal history of other infectious and parasitic diseases: Secondary | ICD-10-CM

## 2022-06-14 LAB — CLOSTRIDIUM DIFFICILE BY PCR: Toxigenic C. Difficile by PCR: NEGATIVE

## 2022-06-15 DIAGNOSIS — Z8619 Personal history of other infectious and parasitic diseases: Secondary | ICD-10-CM

## 2022-06-15 NOTE — Telephone Encounter (Signed)
Test c diff PCR once more

## 2022-06-16 NOTE — Telephone Encounter (Signed)
C. Diff stool study order in epic. Patient notified of recommendations via [place].

## 2022-06-16 NOTE — Addendum Note (Signed)
Addended by: Yevette Edwards on: 06/16/2022 08:19 AM   Modules accepted: Orders

## 2022-06-26 ENCOUNTER — Ambulatory Visit: Payer: Medicare HMO | Admitting: Physician Assistant

## 2022-07-07 ENCOUNTER — Other Ambulatory Visit: Payer: Medicare HMO

## 2022-07-07 ENCOUNTER — Other Ambulatory Visit: Payer: Self-pay | Admitting: Family Medicine

## 2022-07-07 DIAGNOSIS — M79651 Pain in right thigh: Secondary | ICD-10-CM

## 2022-07-07 DIAGNOSIS — M25551 Pain in right hip: Secondary | ICD-10-CM

## 2022-07-07 DIAGNOSIS — Z8619 Personal history of other infectious and parasitic diseases: Secondary | ICD-10-CM | POA: Diagnosis not present

## 2022-07-09 ENCOUNTER — Other Ambulatory Visit: Payer: Self-pay

## 2022-07-09 ENCOUNTER — Other Ambulatory Visit (HOSPITAL_COMMUNITY): Payer: Self-pay

## 2022-07-09 LAB — CLOSTRIDIUM DIFFICILE BY PCR: Toxigenic C. Difficile by PCR: POSITIVE — AB

## 2022-07-09 MED ORDER — FIDAXOMICIN 200 MG PO TABS
200.0000 mg | ORAL_TABLET | Freq: Two times a day (BID) | ORAL | 0 refills | Status: DC
  Filled 2022-07-09: qty 20, 10d supply, fill #0

## 2022-07-10 ENCOUNTER — Other Ambulatory Visit (HOSPITAL_COMMUNITY): Payer: Self-pay

## 2022-07-10 ENCOUNTER — Other Ambulatory Visit: Payer: Self-pay

## 2022-07-10 MED ORDER — VANCOMYCIN HCL 125 MG PO CAPS: 125.0000 mg | ORAL_CAPSULE | Freq: Four times a day (QID) | ORAL | 0 refills | Status: DC

## 2022-07-11 ENCOUNTER — Other Ambulatory Visit (HOSPITAL_COMMUNITY): Payer: Self-pay

## 2022-07-13 ENCOUNTER — Telehealth: Payer: Self-pay

## 2022-07-13 ENCOUNTER — Other Ambulatory Visit: Payer: Self-pay

## 2022-07-13 ENCOUNTER — Other Ambulatory Visit (HOSPITAL_COMMUNITY): Payer: Self-pay

## 2022-07-13 MED ORDER — FIDAXOMICIN 200 MG PO TABS: 200.0000 mg | ORAL_TABLET | Freq: Two times a day (BID) | ORAL | 0 refills | Status: DC

## 2022-07-13 NOTE — Telephone Encounter (Signed)
Prescription sent as requested.

## 2022-07-13 NOTE — Telephone Encounter (Signed)
Inbound call from patient states she got her medicaid card in the mail and they will cover her rx dificid. She would like this sent to [facility] on [address], please advise.

## 2022-07-24 ENCOUNTER — Other Ambulatory Visit: Payer: Self-pay | Admitting: Cardiovascular Disease

## 2022-07-24 ENCOUNTER — Other Ambulatory Visit: Payer: Self-pay | Admitting: Family Medicine

## 2022-07-24 DIAGNOSIS — Z8619 Personal history of other infectious and parasitic diseases: Secondary | ICD-10-CM

## 2022-07-24 MED ORDER — IRBESARTAN 150 MG PO TABS: 150.0000 mg | ORAL_TABLET | Freq: Every day | ORAL | 0 refills | Status: DC

## 2022-07-27 MED ORDER — ACYCLOVIR 400 MG PO TABS: 400.0000 mg | ORAL_TABLET | Freq: Two times a day (BID) | ORAL | 0 refills | Status: DC

## 2022-07-29 ENCOUNTER — Encounter: Payer: Self-pay | Admitting: Family Medicine

## 2022-08-06 DIAGNOSIS — A498 Other bacterial infections of unspecified site: Secondary | ICD-10-CM

## 2022-08-07 ENCOUNTER — Encounter: Payer: Self-pay | Admitting: Family Medicine

## 2022-08-07 ENCOUNTER — Inpatient Hospital Stay: Admission: RE | Admit: 2022-08-07 | Payer: Medicare HMO | Source: Ambulatory Visit

## 2022-08-07 NOTE — Telephone Encounter (Signed)
Ambulatory referral to Infectious Disease in epic. Patient notified via [place].

## 2022-08-09 ENCOUNTER — Encounter: Payer: Self-pay | Admitting: Family Medicine

## 2022-08-10 ENCOUNTER — Telehealth: Payer: Self-pay | Admitting: Cardiovascular Disease

## 2022-08-10 ENCOUNTER — Other Ambulatory Visit: Payer: Self-pay | Admitting: Family Medicine

## 2022-08-10 DIAGNOSIS — M79651 Pain in right thigh: Secondary | ICD-10-CM

## 2022-08-10 DIAGNOSIS — M25551 Pain in right hip: Secondary | ICD-10-CM

## 2022-08-10 NOTE — Telephone Encounter (Signed)
Needs to check BP once per day and keep a log. Bring log and BP cuff to next clinic visit.   Last seen >1 year ago. Overdue for follow up with [facility] - could be scheduled with Dr. Oval Linsey, myself, or PharmD.   Recommend OV for further evaluation.   Loel Dubonnet, NP

## 2022-08-10 NOTE — Telephone Encounter (Signed)
Pt c/o medication issue:  1. Name of Medication: amLODipine (NORVASC) 10 MG tablet irbesartan (AVAPRO) 150 MG tablet  2. How are you currently taking this medication (dosage and times per day)? Stopped taking this medication 10/15  3. Are you having a reaction (difficulty breathing--STAT)? Yes  4. What is your medication issue? Patient states that she has stopped taking this medication as of 10/15 due to constant leg pain. She states that she has gone to other doctors and had tests done to see what the cause of this could be and it wasn't until she accidentally stopped taking these two medications that her leg pain went away and then shortly after resuming medication she states the pain came right back. Pt says she would like to try another medication. Please advise.

## 2022-08-11 MED ORDER — HYDRALAZINE HCL 25 MG PO TABS: 25.0000 mg | ORAL_TABLET | Freq: Two times a day (BID) | ORAL | 0 refills | Status: DC

## 2022-08-11 NOTE — Addendum Note (Signed)
Addended by: Gerald Stabs on: 08/11/2022 04:35 PM   Modules accepted: Orders

## 2022-08-13 ENCOUNTER — Ambulatory Visit: Payer: Medicare HMO | Admitting: Internal Medicine

## 2022-08-13 ENCOUNTER — Telehealth: Payer: Self-pay | Admitting: Pharmacist Clinician (PhC)/ Clinical Pharmacy Specialist

## 2022-08-13 NOTE — Telephone Encounter (Signed)
Repatha renewal PA sent to [place]

## 2022-08-14 ENCOUNTER — Ambulatory Visit (INDEPENDENT_AMBULATORY_CARE_PROVIDER_SITE_OTHER): Payer: Medicare HMO | Admitting: Family

## 2022-08-14 ENCOUNTER — Encounter (HOSPITAL_BASED_OUTPATIENT_CLINIC_OR_DEPARTMENT_OTHER): Payer: Self-pay | Admitting: Family

## 2022-08-14 VITALS — BP 124/76 | HR 82 | Ht 63.0 in | Wt 188.0 lb

## 2022-08-14 DIAGNOSIS — E785 Hyperlipidemia, unspecified: Secondary | ICD-10-CM | POA: Diagnosis not present

## 2022-08-14 DIAGNOSIS — M79605 Pain in left leg: Secondary | ICD-10-CM

## 2022-08-14 DIAGNOSIS — I700 Atherosclerosis of aorta: Secondary | ICD-10-CM

## 2022-08-14 DIAGNOSIS — M79604 Pain in right leg: Secondary | ICD-10-CM | POA: Diagnosis not present

## 2022-08-14 DIAGNOSIS — I739 Peripheral vascular disease, unspecified: Secondary | ICD-10-CM | POA: Diagnosis not present

## 2022-08-14 DIAGNOSIS — Z720 Tobacco use: Secondary | ICD-10-CM

## 2022-08-14 DIAGNOSIS — I10 Essential (primary) hypertension: Secondary | ICD-10-CM | POA: Diagnosis not present

## 2022-08-14 MED ORDER — HYDRALAZINE HCL 25 MG PO TABS
25.0000 mg | ORAL_TABLET | Freq: Two times a day (BID) | ORAL | 3 refills | Status: DC
Start: 2022-08-14 — End: 2022-10-07

## 2022-08-14 NOTE — Progress Notes (Signed)
Advanced Hypertension Clinic Assessment:    Date:  08/14/2022   ID:  Amber Rose, DOB Jan 06, 1976, MRN [id number]  PCP:  Charlott Rakes, MD  Cardiologist:  None  Nephrologist:  Referring MD: Charlott Rakes, MD   CC: Hypertension  History of Present Illness:    Amber Rose is a 46 y.o. female with a hx of hypertension, diabetes, tobacco use, aortic atherosclerosis, hyperlipidemia (intolerant to atorvastatin, rosuvastatin) fibromyalgia, depression, anxiety here to follow-up in the Advanced Hypertension Clinic.   Initially diagnosed with hypertension in her 48s.  BP initially well controlled but stopped amlodipine and subsequently was elevated.  Primary care provider added hydrochlorothiazide 10/2020.  She did note occasional swelling around her eyes which did not improve when she was off lisinopril.  She established with advanced hypertension clinic 12/12/2020.  Her BP was actually well controlled on lisinopril and hydrochlorothiazide which can continued.  She was referred to the prep program at the [facility].  She saw pharmacy team 04/15/21.  She was referred to the prep program.  She noted she was getting most of her food from food pantry and unable to follow low-sodium diet.  She noted dry cough on lisinopril and was switched to irbesartan.  Intolerance to atorvastatin rosuvastatin she was recommended to start [place].  She has not been seen in follow-up since that time.  She sent a message noting she had discontinued her amlodipine as well as irbesartan due to leg pain.  She started on hydralazine 25 mg twice daily.  Presents today for follow-up. 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 Notes leg cramps onset 5-6 years ago when diagnosed with diabetes.  Occasional cramp in her arms. Describes as "pinching, throbbing". Subsided considerably after stopping statin. Also noted facial swelling. She describes the leg pain as a "shooting" pain down her leg. Tells me laying  down is "always horrible". But sometimes has pain with ambulation. She is tolerating Hydralazine without difficulty  Previous antihypertensives: Amlodipine - leg cramps, face swelling Irbesartan - leg cramps, face swelling  HCTZ  Past Medical History:  Diagnosis Date   Allergy    Anxiety    Arthritis    Chronic UTI    Clostridium difficile infection    Depression    Diabetes mellitus without complication ([place])    Diverticulosis    Fibromyalgia    GERD (gastroesophageal reflux disease)    Hiatal hernia    Hyperlipidemia    Hypertension    Internal hemorrhoids    Meningitis, viral    Pancreatitis    Pure hypercholesterolemia 12/23/2020    Past Surgical History:  Procedure Laterality Date   APPENDECTOMY  1990   ceasarian  2002   CESAREAN SECTION     x1   CHOLECYSTECTOMY  2011   COLONOSCOPY     UPPER GASTROINTESTINAL ENDOSCOPY     URINARY SURGERY     urethra sling, removal, and then revision 2016 (4 surgeries)   WISDOM TOOTH EXTRACTION      Current Medications: Current Meds  Medication Sig   acyclovir (ZOVIRAX) 400 MG tablet Take 1 tablet (400 mg total) by mouth 2 (two) times daily.   albuterol (PROAIR HFA) 108 (90 Base) MCG/ACT inhaler Inhale 2 puffs into the lungs every 4 (four) hours as needed for wheezing or shortness of breath.   Blood Glucose Monitoring Suppl (ACCU-CHEK AVIVA PLUS) w/Device KIT USE AS DIRECTED DAILY. E11.9   buPROPion (WELLBUTRIN SR) 150 MG 12 hr tablet Take 1 tablet (150 mg total) by  mouth 2 (two) times daily.   Cholecalciferol (VITAMIN D) 125 MCG (5000 UT) CAPS Take 1 capsule by mouth daily at 2 PM.   Evolocumab (REPATHA SURECLICK) 094 MG/ML SOAJ Inject 140 mg into the skin every 14 (fourteen) days.   hydrALAZINE (APRESOLINE) 25 MG tablet Take 1 tablet (25 mg total) by mouth in the morning and at bedtime.   hydrOXYzine (ATARAX/VISTARIL) 25 MG tablet Take 1 tablet (25 mg total) by mouth 3 (three) times daily as needed.   hyoscyamine (LEVSIN SL)  0.125 MG SL tablet 1 tablet sublingual every 4-6 hours   Lancet Devices (ACCU-CHEK SOFTCLIX) lancets Use as instructed daily.   meloxicam (MOBIC) 15 MG tablet TAKE 1 TABLET (15 MG TOTAL) BY MOUTH DAILY.   methocarbamol (ROBAXIN) 750 MG tablet Take 1 tablet (750 mg total) by mouth every 8 (eight) hours as needed for muscle spasms.   metroNIDAZOLE (METROGEL VAGINAL) 0.75 % vaginal gel Place 1 Applicatorful vaginally at bedtime.   Multiple Vitamin (MULTI-VITAMIN DAILY) TABS Take 1 tablet by mouth daily as needed.   Nicotine 21-14-7 MG/24HR KIT Place 1 patch onto the skin as directed.   nicotine polacrilex (NICORETTE) 2 MG gum Take 1 each (2 mg total) by mouth as needed for smoking cessation.   omeprazole (PRILOSEC) 40 MG capsule Take 1 tab every morning.   predniSONE (DELTASONE) 20 MG tablet Take 20 mg by mouth daily as needed.   pregabalin (LYRICA) 100 MG capsule Take 1 capsule (100 mg total) by mouth 2 (two) times daily.   saccharomyces boulardii (FLORASTOR) 250 MG capsule Take 1 capsule (250 mg total) by mouth 2 (two) times daily.   Semaglutide, 1 MG/DOSE, 4 MG/3ML SOPN Inject 1 mg into the skin once a week as directed.     Allergies:   Atorvastatin, Crestor [rosuvastatin], Metformin and related, Metronidazole, Penicillins, Xanax [alprazolam], Glyburide, Linagliptin, and Moxifloxacin   Social History   Socioeconomic History   Marital status: Single    Spouse name: Not on file   Number of children: 3   Years of education: Not on file   Highest education level: Not on file  Occupational History   Not on file  Tobacco Use   Smoking status: Every Day    Packs/day: 0.10    Years: 20.00    Total pack years: 2.00    Types: Cigarettes   Smokeless tobacco: Never   Tobacco comments:    Patient is engaged in health coaching for smoking cessation as of 12/26/20  Vaping Use   Vaping Use: Never used  Substance and Sexual Activity   Alcohol use: Yes    Comment: occ   Drug use: Not Currently     Types: Marijuana   Sexual activity: Never    Comment: pt on her mentral cycle now began 11-14-17  Other Topics Concern   Not on file  Social History Narrative   Lives home with adult niece.  Not working.  Disability pending.  Pt is single.  Education GED.  3 children.    Social Determinants of Health   Financial Resource Strain: Not on file  Food Insecurity: Food Insecurity Present (01/20/2021)   Hunger Vital Sign    Worried About Running Out of Food in the Last Year: Often true    Ran Out of Food in the Last Year: Sometimes true  Transportation Needs: No Transportation Needs (01/20/2021)   PRAPARE - Hydrologist (Medical): No    Lack of Transportation (Non-Medical): No  Physical Activity: Not on file  Stress: Not on file  Social Connections: Not on file     Family History: The patient's family history includes Cancer in her mother; Diabetes in her father; Heart attack in her maternal grandfather; Hypertension in her mother; Multiple sclerosis in her paternal grandmother. There is no history of Allergic rhinitis, Angioedema, Asthma, Eczema, Urticaria, Colon cancer, Esophageal cancer, Liver disease, Pancreatic cancer, Prostate cancer, Rectal cancer, Stomach cancer, or Colon polyps.  ROS:   Please see the history of present illness.    All other systems reviewed and are negative.  EKGs/Labs/Other Studies Reviewed:    EKG:  EKG is ordered today.  The ekg ordered today demonstrates NSR 82 bpm with no acute ST/T wave changes.   Recent Labs: 11/28/2021: TSH 1.130 02/20/2022: ALT 13; BUN 10; Creatinine, Ser 0.64; Hemoglobin 12.7; Platelets 389.0; Potassium 4.1; Sodium 135   Recent Lipid Panel    Component Value Date/Time   CHOL 233 (H) 01/29/2022 0914   TRIG 124 01/29/2022 0914   HDL 63 01/29/2022 0914   CHOLHDL 3.7 01/29/2022 0914   CHOLHDL 4.4 10/28/2016 0851   LDLCALC 148 (H) 01/29/2022 0914    Physical Exam:   VS:  BP 124/76   Pulse 82   Ht  _0  (1.6 m)   Wt 188 lb (85.3 kg)   BMI 33.30 kg/m  , BMI Body mass index is 33.3 kg/m. GENERAL:  Well appearing HEENT: Pupils equal round and reactive, fundi not visualized, oral mucosa unremarkable NECK:  No jugular venous distention, waveform within normal limits, carotid upstroke brisk and symmetric, no bruits, no thyromegaly LYMPHATICS:  No cervical adenopathy LUNGS:  Clear to auscultation bilaterally HEART:  RRR.  PMI not displaced or sustained,S1 and S2 within normal limits, no S3, no S4, no clicks, no rubs, no murmurs ABD:  Flat, positive bowel sounds normal in frequency in pitch, no bruits, no rebound, no guarding, no midline pulsatile mass, no hepatomegaly, no splenomegaly EXT:  2 plus pulses throughout, no edema, no cyanosis no clubbing SKIN:  No rashes no nodules NEURO:  Cranial nerves II through XII grossly intact, motor grossly intact throughout PSYCH:  Cognitively intact, oriented to person place and time   ASSESSMENT/PLAN:    HTN -had very atypical reaction to amlodipine and irbesartan with leg discomfort.  Low suspicion it was contributory to symptoms as her blood pressure is well controlled we will continue hydralazine 25 mg twice daily.  Discussed to monitor BP at home at least 2 hours after medications and sitting for 5-10 minutes.  HLD, LDL goal <70 - Intolerant to Rosuvastatin and Atorvastatin. On Repatha. 01/2022 LDL 148 prior to [place]. Has taken 4-5 doses. Update CMP, direct LDL today.   Leg cramps - 6-7 year history. Improved after stopping statin. Notes pain "shooting" down her leg as long as well as throbbing. Plan for ABI to rule out PAD.  DM2 - Continue to follow with PCP.   Tobacco use - Smoking cessation encouraged. Recommend utilization of 1800QUITNOW.   Aortic atherosclerosis - Stable with no anginal symptoms. No indication for ischemic evaluation.  Continue Repatha.   Screening for Secondary Hypertension:     12/23/2020   10:45 AM  Causes   Drugs/Herbals Screened     - Comments NSAIDS  Renovascular HTN Screened  Sleep Apnea Screened  Hyperthyroidism Screened     - Comments check TSH  Thyroid Disease Screened     - Comments check TSH  Hyperaldosteronism Not Screened     -  Comments CT w/o adenoma 2021  Pheochromocytoma Not Screened  Cushing's Syndrome Not Screened  Hyperparathyroidism Not Screened  Coarctation of the Aorta Screened     - Comments BP symmetric  Compliance Screened    Relevant Labs/Studies:    Latest Ref Rng & Units 02/20/2022    9:45 AM 09/17/2021   11:00 AM 12/12/2020   11:49 AM  Basic Labs  Sodium 135 - 145 mEq/L 135  137  138   Potassium 3.5 - 5.1 mEq/L 4.1  3.8  4.3   Creatinine 0.40 - 1.20 mg/dL 0.64  0.59  0.61        Latest Ref Rng & Units 11/28/2021   11:11 AM 12/12/2020   11:49 AM  Thyroid   TSH 0.450 - 4.500 uIU/mL 1.130  1.090                  Disposition:    FU with MD/PharmD/APP in 6 months   Medication Adjustments/Labs and Tests Ordered: Current medicines are reviewed at length with the patient today.  Concerns regarding medicines are outlined above.  No orders of the defined types were placed in this encounter.  No orders of the defined types were placed in this encounter.  Signed, Loel Dubonnet, NP  08/14/2022 1:46 PM    [facility]

## 2022-08-14 NOTE — Patient Instructions (Addendum)
Medication Instructions:  Continue your current medications.   *If you need a refill on your cardiac medications before your next appointment, please call your pharmacy*   Lab Work: Your physician recommends that you return for lab work today: CMP, direct LDL, magnesium  If you have labs (blood work) drawn today and your tests are completely normal, you will receive your results only by: [place] (if you have MyChart) OR A paper copy in the mail If you have any lab test that is abnormal or we need to change your treatment, we will call you to review the results.  Testing/Procedures:  Your EKG today was normal which is great!  Your physician has requested that you have an ankle brachial index (ABI). During this test an ultrasound and blood pressure cuff are used to evaluate the arteries that supply the arms and legs with blood. Allow thirty minutes for this exam. There are no restrictions or special instructions.  Your physician has requested that you have a lower or upper extremity arterial duplex. This test is an ultrasound of the arteries in the legs or arms. It looks at arterial blood flow in the legs and arms. Allow one hour for Lower and Upper Arterial scans. There are no restrictions or special instructions  Follow-Up: At [facility], you and your health needs are our priority.  As part of our continuing mission to provide you with exceptional heart care, we have created designated Provider Care Teams.  These Care Teams include your primary Cardiologist (physician) and Advanced Practice Providers (APPs -  Physician Assistants and Nurse Practitioners) who all work together to provide you with the care you need, when you need it.  We recommend signing up for the patient portal called "MyChart".  Sign up information is provided on this After Visit Summary.  MyChart is used to connect with patients for Virtual Visits (Telemedicine).  Patients are able to view lab/test  results, encounter notes, upcoming appointments, etc.  Non-urgent messages can be sent to your provider as well.   To learn more about what you can do with MyChart, go to [email or web address].    Your next appointment:   6 month(s)  The format for your next appointment:   In Person  Provider:   Dr. Oval Linsey in [facility]  Other Instructions  Heart Healthy Diet Recommendations: A low-salt diet is recommended. Meats should be grilled, baked, or boiled. Avoid fried foods. Focus on lean protein sources like fish or chicken with vegetables and fruits. The American Heart Association is a Microbiologist!  American Heart Association Diet and Lifeystyle Recommendations   Exercise recommendations: The American Heart Association recommends 150 minutes of moderate intensity exercise weekly. Try 30 minutes of moderate intensity exercise 4-5 times per week. This could include walking, jogging, or swimming.

## 2022-08-15 LAB — COMPREHENSIVE METABOLIC PANEL
ALT: 10 IU/L (ref 0–32)
AST: 12 IU/L (ref 0–40)
Albumin/Globulin Ratio: 1.6 (ref 1.2–2.2)
Albumin: 4.1 g/dL (ref 3.9–4.9)
Alkaline Phosphatase: 80 IU/L (ref 44–121)
BUN/Creatinine Ratio: 16 (ref 9–23)
BUN: 14 mg/dL (ref 6–24)
Bilirubin Total: <0.2 mg/dL (ref 0.0–1.2)
CO2: 23 mmol/L (ref 20–29)
Calcium: 9 mg/dL (ref 8.7–10.2)
Chloride: 105 mmol/L (ref 96–106)
Creatinine, Ser: 0.89 mg/dL (ref 0.57–1.00)
Globulin, Total: 2.5 g/dL (ref 1.5–4.5)
Glucose: 99 mg/dL (ref 70–99)
Potassium: 4 mmol/L (ref 3.5–5.2)
Sodium: 139 mmol/L (ref 134–144)
Total Protein: 6.6 g/dL (ref 6.0–8.5)
eGFR: 81 mL/min/{1.73_m2} (ref >59)

## 2022-08-15 LAB — MAGNESIUM: Magnesium: 2 mg/dL (ref 1.6–2.3)

## 2022-08-15 LAB — LDL CHOLESTEROL, DIRECT: LDL Direct: 51 mg/dL (ref 0–99)

## 2022-08-17 ENCOUNTER — Encounter: Payer: Self-pay | Admitting: Internal Medicine

## 2022-08-17 ENCOUNTER — Other Ambulatory Visit: Payer: Self-pay

## 2022-08-17 ENCOUNTER — Ambulatory Visit (INDEPENDENT_AMBULATORY_CARE_PROVIDER_SITE_OTHER): Payer: Medicare HMO | Admitting: Internal Medicine

## 2022-08-17 DIAGNOSIS — K909 Intestinal malabsorption, unspecified: Secondary | ICD-10-CM | POA: Diagnosis not present

## 2022-08-17 NOTE — Telephone Encounter (Signed)
Please advise 

## 2022-08-17 NOTE — Progress Notes (Signed)
[facility] for Infectious Disease      Reason for Consult: concern recurrent C diff infection    Referring Physician: Darrell Jewel, PA    Patient ID: Amber Rose, female    DOB: 03-14-1976, 46 y.o.   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  She initially developed issues with abdominal bloating and discomfort associated with a strong odor with her stools.  Initially thought she was having issues with menopause but after seeing gynecology went to GI.  She described having very solid, hard stools, almost brick-like, greasy/oily and very large.  She described having to clean the toilet due to the solidness of the stools.  She never had any diarrhea.  CT scan was unremarkable and she was tested for C diff and was PCR positive.    Past Medical History:  Diagnosis Date   Allergy    Anxiety    Arthritis    Chronic UTI    Clostridium difficile infection    Depression    Diabetes mellitus without complication (H[facility])    Diverticulosis    Fibromyalgia    GERD (gastroesophageal reflux disease)    Hiatal hernia    Hyperlipidemia    Hypertension    Internal hemorrhoids    Meningitis, viral    Pancreatitis    Pure hypercholesterolemia 12/23/2020    Prior to Admission medications   Medication Sig Start Date End Date Taking? Authorizing Provider  acyclovir (ZOVIRAX) 400 MG tablet Take 1 tablet (400 mg total) by mouth 2 (two) times daily. 07/27/22   Charlott Rakes, MD  albuterol (PROAIR HFA) 108 (90 Base) MCG/ACT inhaler Inhale 2 puffs into the lungs every 4 (four) hours as needed for wheezing or shortness of breath. 04/05/18   Charlott Rakes, MD  Blood Glucose Monitoring Suppl (ACCU-CHEK AVIVA PLUS) w/Device KIT USE AS DIRECTED DAILY. E11.9 09/13/17   Charlott Rakes, MD  buPROPion (WELLBUTRIN SR) 150 MG 12 hr tablet Take 1 tablet (150 mg total) by mouth 2 (two) times daily. 02/24/22   Charlott Rakes, MD  Cholecalciferol (VITAMIN D) 125  MCG (5000 UT) CAPS Take 1 capsule by mouth daily at 2 PM.    [provider]  Evolocumab (REPATHA SURECLICK) 878 MG/ML SOAJ Inject 140 mg into the skin every 14 (fourteen) days. 02/26/22   Skeet Latch, MD  hydrALAZINE (APRESOLINE) 25 MG tablet Take 1 tablet (25 mg total) by mouth in the morning and at bedtime. 08/14/22   Loel Dubonnet, NP  hydrOXYzine (ATARAX/VISTARIL) 25 MG tablet Take 1 tablet (25 mg total) by mouth 3 (three) times daily as needed. 07/02/20   Charlott Rakes, MD  hyoscyamine (LEVSIN SL) 0.125 MG SL tablet 1 tablet sublingual every 4-6 hours 02/25/22   Levin Erp, PA  Lancet Devices [facility]) lancets Use as instructed daily. 05/14/17   Charlott Rakes, MD  meloxicam (MOBIC) 15 MG tablet TAKE 1 TABLET (15 MG TOTAL) BY MOUTH DAILY. 08/10/22   Charlott Rakes, MD  methocarbamol (ROBAXIN) 750 MG tablet Take 1 tablet (750 mg total) by mouth every 8 (eight) hours as needed for muscle spasms. 02/24/22   Charlott Rakes, MD  metroNIDAZOLE (METROGEL VAGINAL) 0.75 % vaginal gel Place 1 Applicatorful vaginally at bedtime. 06/09/22   Charlott Rakes, MD  Multiple Vitamin (MULTI-VITAMIN DAILY) TABS Take 1 tablet by mouth daily as needed.    [provider]  Nicotine 21-14-7 MG/24HR KIT Place 1 patch onto the skin as directed.  11/28/21   Clarnce Flock, MD  nicotine polacrilex (NICORETTE) 2 MG gum Take 1 each (2 mg total) by mouth as needed for smoking cessation. 11/28/21   Clarnce Flock, MD  omeprazole (PRILOSEC) 40 MG capsule Take 1 tab every morning. 02/24/22   Charlott Rakes, MD  predniSONE (DELTASONE) 20 MG tablet Take 20 mg by mouth daily as needed.    [provider]  pregabalin (LYRICA) 100 MG capsule Take 1 capsule (100 mg total) by mouth 2 (two) times daily. 05/12/22   Charlott Rakes, MD  saccharomyces boulardii (FLORASTOR) 250 MG capsule Take 1 capsule (250 mg total) by mouth 2 (two) times daily. 05/12/22   Levin Erp, PA   Semaglutide, 1 MG/DOSE, 4 MG/3ML SOPN Inject 1 mg into the skin once a week as directed. 04/08/22   Charlott Rakes, MD    Allergies  Allergen Reactions   Amlodipine     Leg pain, swelling around eyes    Atorvastatin Other (See Comments)     Joint pain   Crestor [Rosuvastatin]     Joint pain    Irbesartan     Leg pain, swelling around eyes   Metformin And Related Nausea And Vomiting   Metronidazole Nausea And Vomiting   Penicillins     childhood   Xanax [Alprazolam]     "Caused upper respiratory symptoms" per pt   Glyburide Other (See Comments)    "blood sugar dropped uncontrollably"   Linagliptin Diarrhea and Other (See Comments)    Stomach pain, sinus infection   Moxifloxacin Diarrhea    Social History   Tobacco Use   Smoking status: Every Day    Packs/day: 0.10    Years: 20.00    Total pack years: 2.00    Types: Cigarettes   Smokeless tobacco: Never   Tobacco comments:    Patient is engaged in health coaching for smoking cessation as of 12/26/20  Vaping Use   Vaping Use: Never used  Substance Use Topics   Alcohol use: Yes    Comment: occ   Drug use: Not Currently    Types: Marijuana    Family History  Problem Relation Age of Onset   Cancer Mother        cervical, breast, lungm skin, bladder-ex smoker   Hypertension Mother    Diabetes Father    Heart attack Maternal Grandfather    Multiple sclerosis Paternal Grandmother    Allergic rhinitis Neg Hx    Angioedema Neg Hx    Asthma Neg Hx    Eczema Neg Hx    Urticaria Neg Hx    Colon cancer Neg Hx    Esophageal cancer Neg Hx    Liver disease Neg Hx    Pancreatic cancer Neg Hx    Prostate cancer Neg Hx    Rectal cancer Neg Hx    Stomach cancer Neg Hx    Colon polyps Neg Hx     Review of Systems  Constitutional: negative for fevers and chills All other systems reviewed and are negative    Constitutional: alert EYES: anicteric Respiratory: normal respiratory effort   Labs: Lab Results   Component Value Date   WBC 12.0 (H) 02/20/2022   HGB 12.7 02/20/2022   HCT 38.7 02/20/2022   MCV 88.8 02/20/2022   PLT 389.0 02/20/2022    Lab Results  Component Value Date   CREATININE 0.89 08/14/2022   BUN 14 08/14/2022   NA 139 08/14/2022   K 4.0 08/14/2022  CL 105 08/14/2022   CO2 23 08/14/2022    Lab Results  Component Value Date   ALT 10 08/14/2022   AST 12 08/14/2022   ALKPHOS 80 08/14/2022   BILITOT <0.2 08/14/2022     Assessment: positive C diff PCR, asymptomatic.  I had a thorough discussion of her symptoms and she clearly describes not having any diarrhea at all during these episodes and only fully formed, large bowel movements with some greasy texture.  I do not consider this consistent with C diff infection.  I discussed with her that testing without symptoms is not indicated and testing outside of ongoing watery diarrhea does not itself diagnose active infection but colonization.   Plan: 1)  follow up with GI regarding symptoms 2) no further testing or treatment for C diff unless she develops the appropriate clinical syndrome

## 2022-08-18 ENCOUNTER — Ambulatory Visit: Payer: Medicare HMO | Admitting: Obstetrics and Gynecology

## 2022-08-18 NOTE — Telephone Encounter (Signed)
BP readings look good. Not concerned for BP less than 120 unless significantly dizzy or lightheaded. If she gets a blood pressure reading less than 120 she can eat and drink something which will help to bring the blood pressure back up. As headache is not daily and not a common side effect of Hydralazine would have low suspicion it is causing headache. She may use Tylenol as needed for headache. Ensure staying hydrated, not suddenly reducing caffeine, getting adequate sleep as these can all help to prevent headache.   Loel Dubonnet, NP

## 2022-08-19 NOTE — Progress Notes (Signed)
Per review of chart and payor information, patient has a diagnosis of diabetes but is not currently prescribed a statin. Noted history of statin intolerance due to joint pain. However, she does not have a statin exclusion code associated with a provider visit to exclude the patient from the measure. She is on Repatha.   Patient is scheduled for a PCP follow up 09/23/2022. If deemed therapeutically appropriate, please consider the following:    -Consider coding for past intolerance (required annually).  Patients with intolerance to statin therapy for reasons below can be coded for exclusion from Statin Use in Persons with Cardiovascular Disease (SPC) measure.    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 Added reminder to appointment notes.  Joseph Art, Pharm.D. PGY-2 Ambulatory Care Pharmacy Resident 08/19/2022 12:46 PM

## 2022-08-20 ENCOUNTER — Other Ambulatory Visit: Payer: Self-pay | Admitting: Cardiovascular Disease

## 2022-08-20 ENCOUNTER — Encounter (HOSPITAL_BASED_OUTPATIENT_CLINIC_OR_DEPARTMENT_OTHER): Payer: Self-pay

## 2022-08-21 NOTE — Telephone Encounter (Signed)
Responded to her separate MyChart from yesterday - not certain why she responded to an old MyChart message.  Per message yesterday:  "Hi Miss Amero,   As previously mentioned I have low suspicion Hydralazine is causing your headache. If you wish, you could trial half tablet twice per day. If you will send me an update in a couple weeks regarding your blood pressure. I hope you have a good vacation.    Best,  Loel Dubonnet, NP "

## 2022-08-21 NOTE — Telephone Encounter (Signed)
Please advise 

## 2022-08-24 ENCOUNTER — Ambulatory Visit (HOSPITAL_COMMUNITY)
Admission: RE | Admit: 2022-08-24 | Discharge: 2022-08-24 | Disposition: A | Discharge status: Home / Self Care | Payer: Medicare HMO | Source: Ambulatory Visit | Attending: Internal Medicine | Admitting: Internal Medicine

## 2022-08-24 DIAGNOSIS — I739 Peripheral vascular disease, unspecified: Secondary | ICD-10-CM | POA: Insufficient documentation

## 2022-08-24 DIAGNOSIS — M79604 Pain in right leg: Secondary | ICD-10-CM | POA: Insufficient documentation

## 2022-08-24 DIAGNOSIS — M79605 Pain in left leg: Secondary | ICD-10-CM | POA: Diagnosis not present

## 2022-08-26 MED ORDER — DOXAZOSIN MESYLATE 2 MG PO TABS: 2.0000 mg | ORAL_TABLET | Freq: Every day | ORAL | 2 refills | Status: DC

## 2022-08-27 NOTE — Telephone Encounter (Signed)
Please advise 

## 2022-08-28 ENCOUNTER — Encounter (HOSPITAL_BASED_OUTPATIENT_CLINIC_OR_DEPARTMENT_OTHER): Payer: Self-pay

## 2022-08-28 IMAGING — MG DIGITAL DIAGNOSTIC BILAT W/ TOMO W/ CAD
8 series · 8 of 24 positions shown · non-contrast
Comparison: Previous exam(s).

CLINICAL DATA: Follow-up probably benign asymmetry in the medial
left breast no ultrasound correlate.

EXAM:
DIGITAL DIAGNOSTIC BILATERAL MAMMOGRAM WITH TOMO AND CAD

[R CC synth-2D]
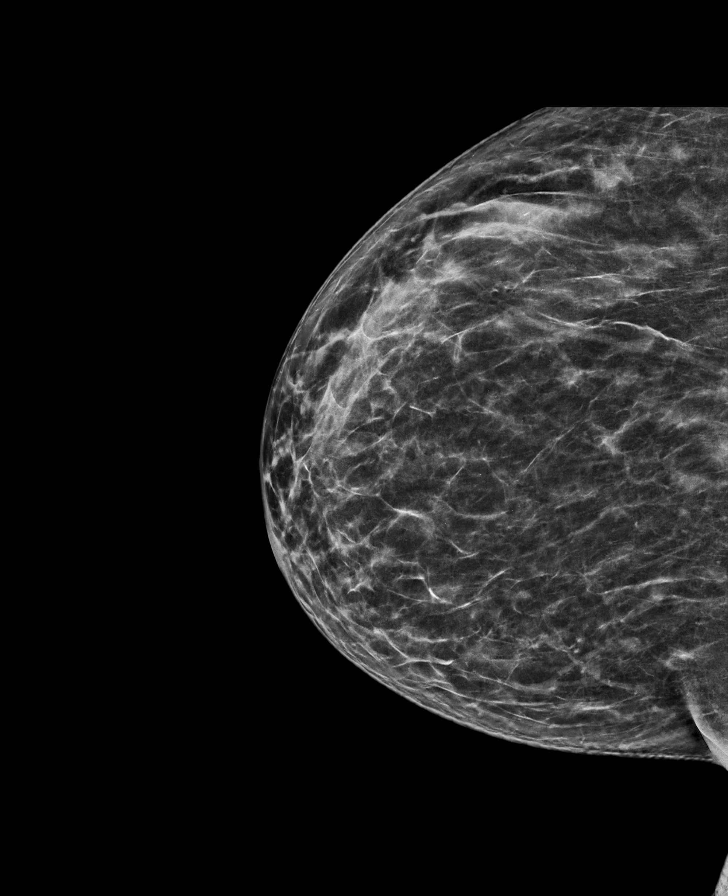

[L CC synth-2D]
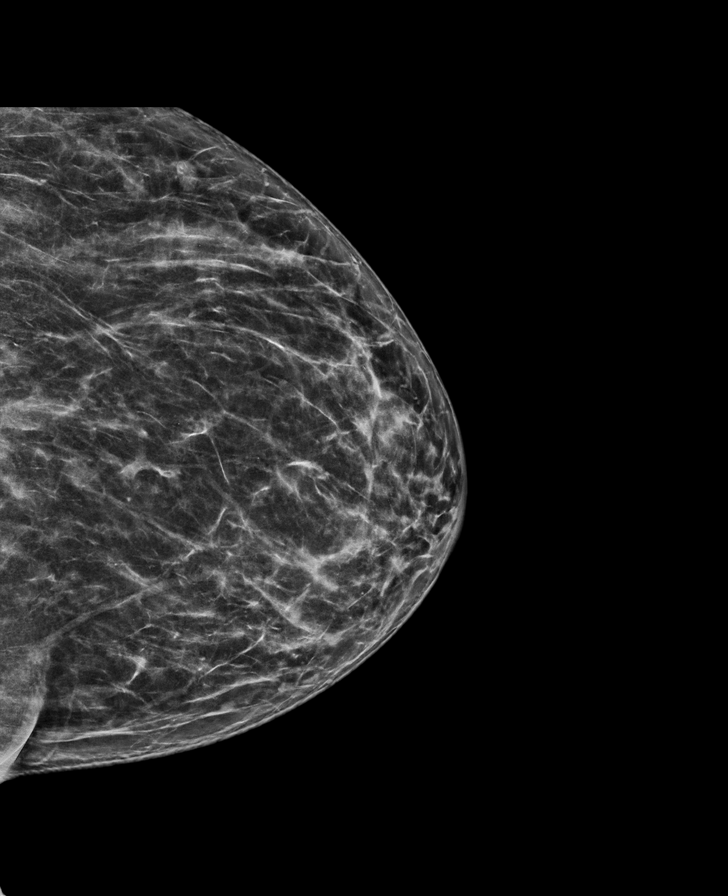

[R MLO synth-2D]
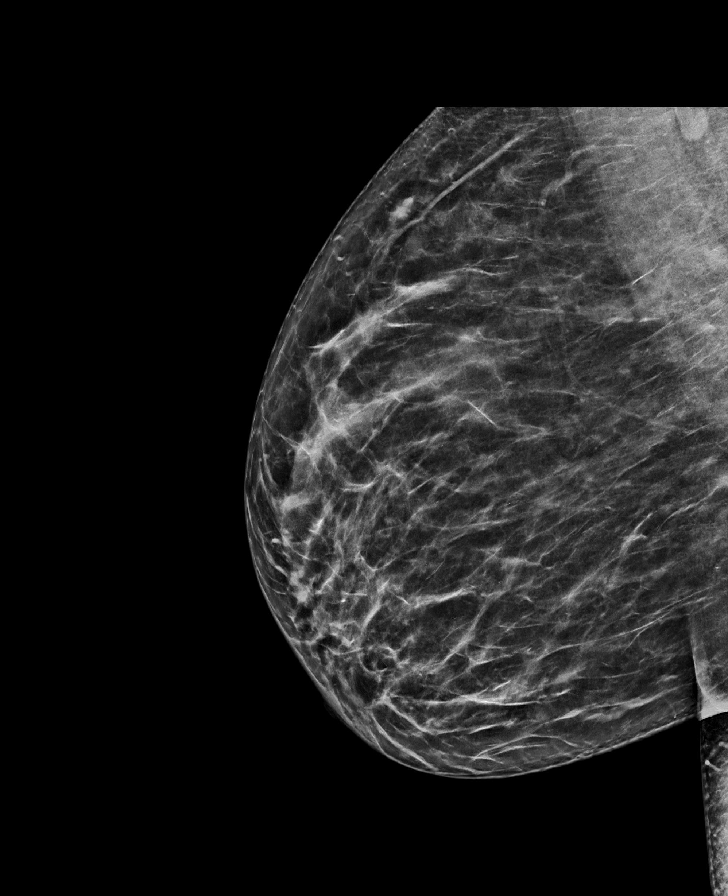

[L MLO synth-2D]
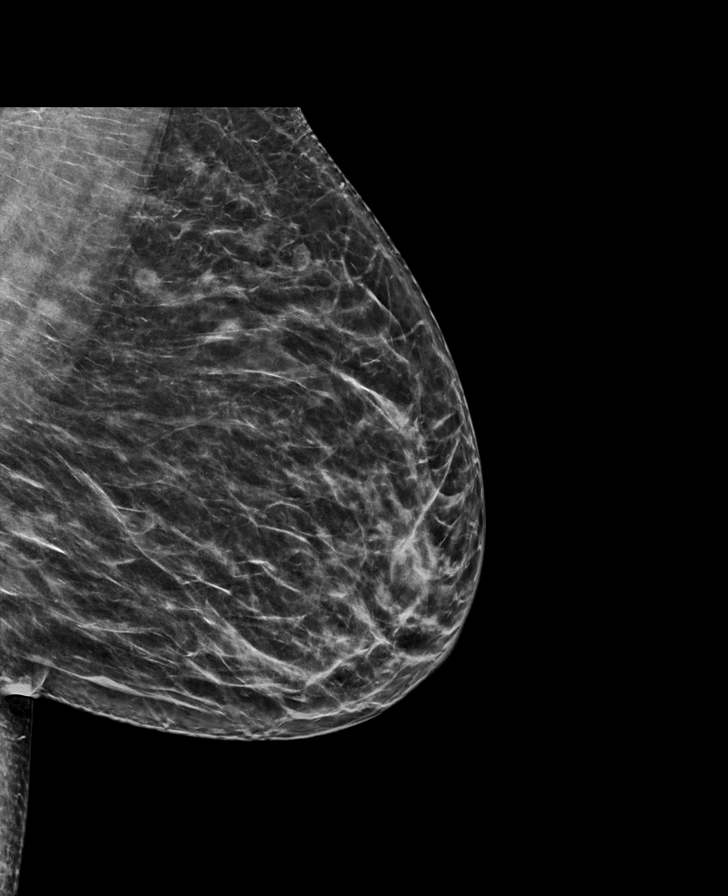

[R MLO tomo · tomo slice 37/73.0]
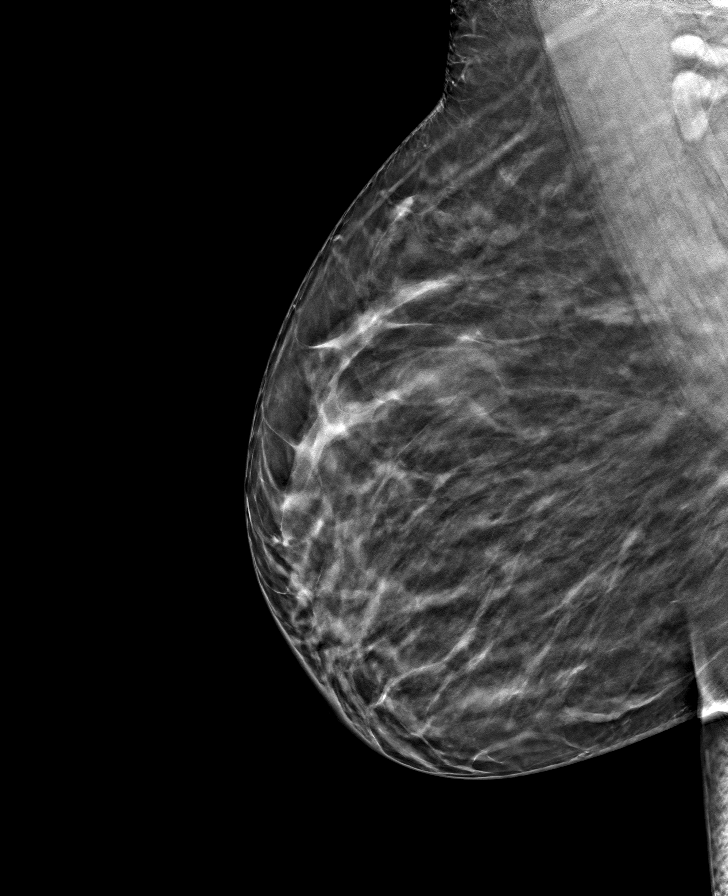

[R CC tomo · tomo slice 35/69.0]
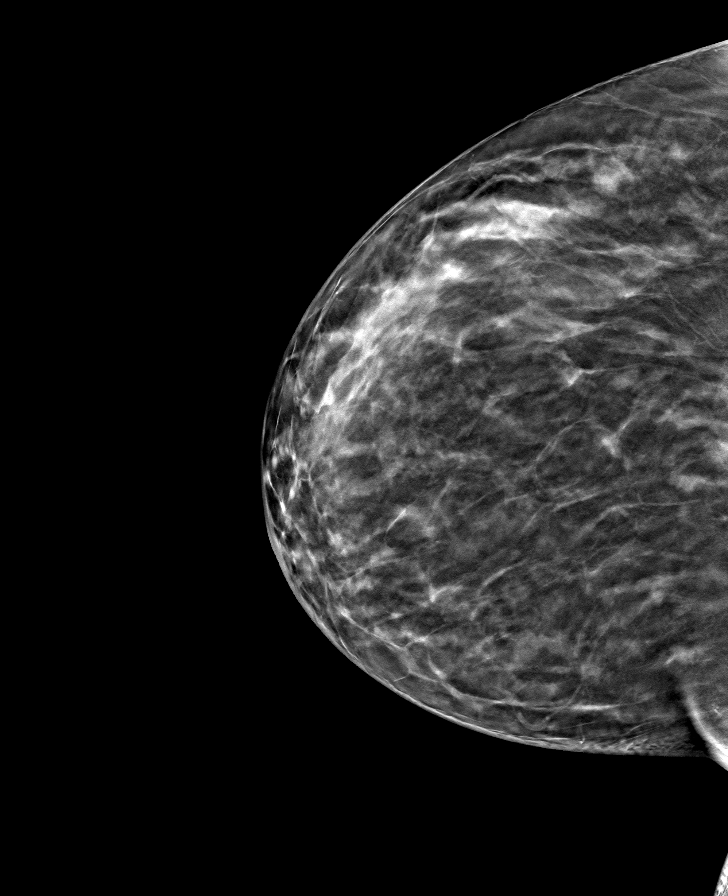

[L CC tomo · tomo slice 35/68.0]
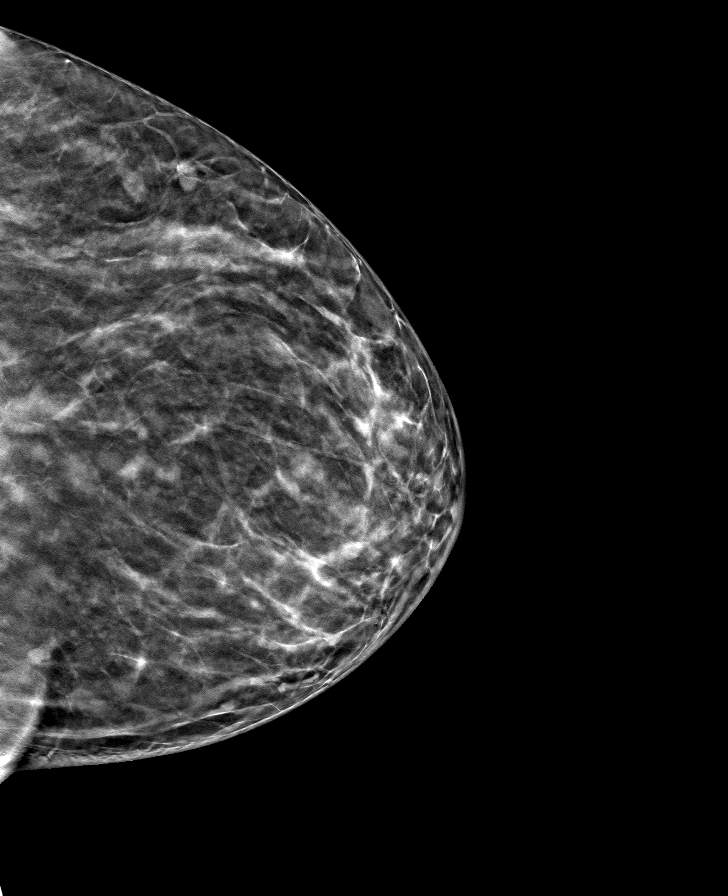

[L MLO tomo · tomo slice 37/73.0]
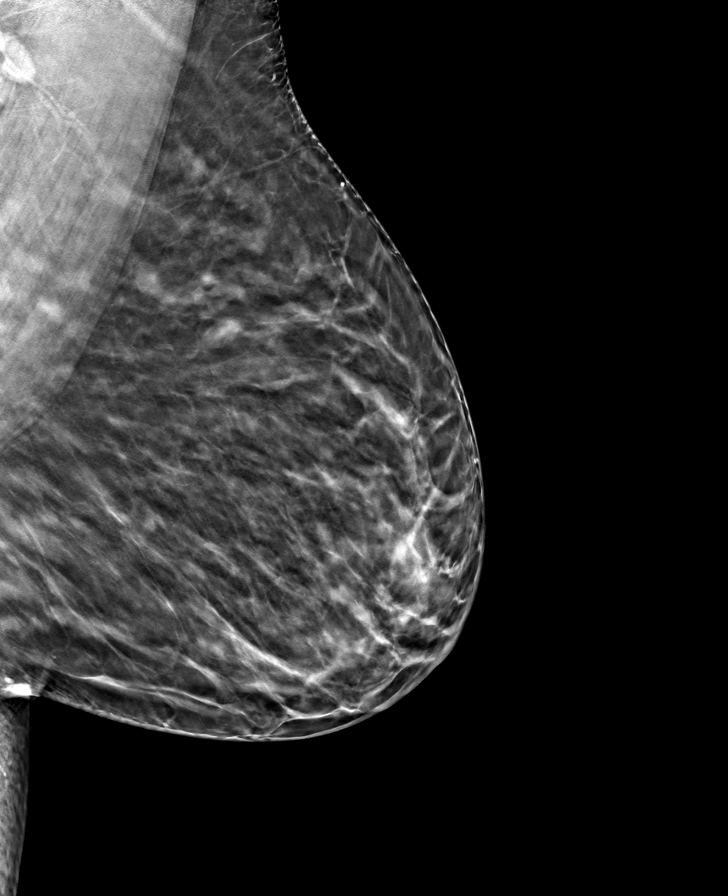

[8 of 24 positions shown; findings below may reference images not displayed]

ACR Breast Density Category c: The breast tissue is heterogeneously
dense, which may obscure small masses.
FINDINGS: The previously demonstrated small asymmetry in the medial left
breast currently has appearance compatible with normal
fibroglandular tissue similar to fibroglandular tissue elsewhere in
both breasts. No findings suspicious malignancy in either breast
today.

Mammographic images were processed with CAD.
IMPRESSION: No evidence of malignancy in either breast. The previously suspected
left breast asymmetry is normal fibroglandular tissue.

RECOMMENDATION:
Bilateral screening mammogram in 1 year.

I have discussed the findings and recommendations with the patient.
If applicable, a reminder letter will be sent to the patient
regarding the next appointment.

BI-RADS CATEGORY  1: Negative.

## 2022-09-02 ENCOUNTER — Ambulatory Visit: Payer: Medicare Other | Attending: Cardiology | Admitting: Pharmacist Clinician (PhC)/ Clinical Pharmacy Specialist

## 2022-09-02 ENCOUNTER — Encounter: Payer: Self-pay | Admitting: Pharmacist Clinician (PhC)/ Clinical Pharmacy Specialist

## 2022-09-02 VITALS — BP 150/102 | HR 70 | Ht 63.5 in | Wt 189.0 lb

## 2022-09-02 DIAGNOSIS — Z720 Tobacco use: Secondary | ICD-10-CM

## 2022-09-02 DIAGNOSIS — T466X5A Adverse effect of antihyperlipidemic and antiarteriosclerotic drugs, initial encounter: Secondary | ICD-10-CM | POA: Diagnosis not present

## 2022-09-02 DIAGNOSIS — I10 Essential (primary) hypertension: Secondary | ICD-10-CM

## 2022-09-02 DIAGNOSIS — G720 Drug-induced myopathy: Secondary | ICD-10-CM

## 2022-09-02 MED ORDER — NICOTINE 14 MG/24HR TD PT24: 14.0000 mg | MEDICATED_PATCH | Freq: Every day | TRANSDERMAL | 1 refills | Status: DC

## 2022-09-02 MED ORDER — IRBESARTAN 150 MG PO TABS: 150.0000 mg | ORAL_TABLET | Freq: Every day | ORAL | 3 refills | Status: DC

## 2022-09-02 NOTE — Assessment & Plan Note (Signed)
Patient with essential hypertension, hard to treat secondary to multiple medication intolerances.  Unsure how many of these are true intolerances vs ongoing health issues.  Reviewed full history from patient perspective and had long discussion over how BP medications work and associated side effects.  Explained the need to try meds for 10-14 days uninterrupted to determine effectiveness.  Since Amber Rose has stopped all BP medications will have her start back with irbesartan 150 mg once daily.  Stressed need to take at the same time daily and check home BP readings twice daily, again at consistent times.  Gave full instructions for home BP checks.  Amber Rose will repeat BMET in 2 weeks and I will see her back in 4 weeks for follow up.

## 2022-09-02 NOTE — Assessment & Plan Note (Signed)
Reviewed dangers of smoking, especially the toxins that are found in cigarettes.  Explained that thing such as arsenic, benzene and lead are much more toxic to the body than anything in her BP medications.  Reviewed effect of nicotine on BP control as well.  She was encouraged to go back to the nicotine patches and endorsed that this is not an easy habit to kick, but in the long run will give her greater benefits.  Will have her go back to 14 mg patches for up to 6 weeks, then 7 mg.  Also encouraged her to take the patches off in the evenings, as nicotine is commonly known to disrupt sleep.

## 2022-09-02 NOTE — Telephone Encounter (Signed)
PA approved to 08/03/2023

## 2022-09-02 NOTE — Progress Notes (Signed)
09/02/2022 [place] Pelto 1975/11/18 [id number]   HPI:  Amber Rose is a 46 y.o. female patient of Dr Oval Linsey, with a [place] below who presents today for advanced hypertension clinic follow up.  Patient was referred to our clinic by Dr Margarita Rana in early 2022 for hypertension associated with multiple medication intolerances.  She had moved to [place] from the [place] and states that all of her current problems began about 6 years ago (in [place]) when she was put on a cholesterol medication and 2 blood pressure medications, all at the same time.  She developed severe leg pains that at times left her unable to walk.  She also developed facial swelling that comes and goes, she believes in relation to her BP readings.  She states that she continued on statin drugs for most of that time and nobody associated it with her leg pains, and once she finally stopped, the pains decreased by 60-70%.  Earlier this summer/fall she was on amlodipine and irbesartan for BP control.  She notes that she missed the irbesartan for a few days, as the pharmacy was trying to get a refill.  A few days later she also stopped the amlodipine because of a change in work schedule and forgot the medication.  States that her leg pains immediately lessened.  She was most recently seen by Laurann Montana NP after multiple My Chart messages with complaints of side effects and concerns.  Her pressure that day was fine and she was asked to continue with hydralazine 25 mg twice daily.  A few days later, after complaints of headache, hot flashes and increase in leg pain, she was given the choice to continue with/without doxazosin 2 mg daily.    Today she is in the office for follow up.   After reading the various notes, the above is my summation.  She believes that the irbesartan was not problematic, as she states she felt no different until a few days later when she stopped the amlodipine.  Played devils advocate and suggested that it  could easily have been the irbesartan that was problematic and it took several days to get out of her system.  She conceded that possibility, but is willing to try again.  Discussed the different mechanisms of how BP is controlled and why we don't expect the reactions she notes to clear so quickly after stopping a particular medication.   She has not taken the hydralazine or doxazosin for the past 3 days.  Still having some facial swelling, unsure at this time if it is related to any medication issues, fibromyalgia or perhaps hormonal changes.     Past Medical History: HLD 10/23  LDL 51 - on Repatha  DM2 8/22  A1c 7.3 - on Ozempic 1 mg, not significant weight loss ~6-8 pounds  CAD Aortic atherosclerosis  fibromyalgia   Tobacco abuse States has tried to quit multiple times, down to 4 cigarettes/day     Blood Pressure Goal:  130/80  Current Medications:   none  Family Hx:   father has hypertension, no heart disease; mother has had multiple cancers; doesn't know much about siblings health, kids 23/28/21 - no health issues  Social Hx:    smokes 4 cigarettes/day- had one this morning; over the past month has cut back from 1 ppd, comes and goes; only occasional alcohol; no coffee or tea regularly, no sodas - occasional Mt Dew  Diet:   mix - fast food at least 1-2 times per week;  not much for vegetables, eating some fruits, very few vegetables   Exercise: stretches, yoga pilates You-Tube videos; exercises from therapy; walk, bouncing ball  Home BP readings:   machine many years old - arm cuff   Intolerances:  atorvastatin, rosuvastatin - joint pains  Lisinopril  - cough  Hydralazine - headache  Labs: 10/23:  Na 139, K 4.0, Glu 99, BUN 14, SCr 0.89, GFR 81   Wt Readings from Last 3 Encounters:  09/02/22 189 lb (85.7 kg)  08/17/22 190 lb (86.2 kg)  08/14/22 188 lb (85.3 kg)   BP Readings from Last 3 Encounters:  09/02/22 (!) 150/102  08/17/22 123/83  08/14/22 124/76   Pulse Readings  from Last 3 Encounters:  09/02/22 70  08/17/22 74  08/14/22 82    Current Outpatient Medications  Medication Sig Dispense Refill   acyclovir (ZOVIRAX) 400 MG tablet Take 1 tablet (400 mg total) by mouth 2 (two) times daily. 180 tablet 0   albuterol (PROAIR HFA) 108 (90 Base) MCG/ACT inhaler Inhale 2 puffs into the lungs every 4 (four) hours as needed for wheezing or shortness of breath. 3 Inhaler 0   Blood Glucose Monitoring Suppl (ACCU-CHEK AVIVA PLUS) w/Device KIT USE AS DIRECTED DAILY. E11.9 1 kit 0   Cholecalciferol (VITAMIN D) 125 MCG (5000 UT) CAPS Take 1 capsule by mouth daily at 2 PM.     doxazosin (CARDURA) 2 MG tablet Take 1 tablet (2 mg total) by mouth daily. 30 tablet 2   Evolocumab (REPATHA SURECLICK) 347 MG/ML SOAJ Inject 140 mg into the skin every 14 (fourteen) days. 2 mL 11   hydrALAZINE (APRESOLINE) 25 MG tablet Take 1 tablet (25 mg total) by mouth in the morning and at bedtime. 180 tablet 3   irbesartan (AVAPRO) 150 MG tablet Take 1 tablet (150 mg total) by mouth daily. 30 tablet 3   Multiple Vitamin (MULTI-VITAMIN DAILY) TABS Take 1 tablet by mouth daily as needed.     nicotine (NICODERM CQ - DOSED IN MG/24 HOURS) 14 mg/24hr patch Place 1 patch (14 mg total) onto the skin daily. 28 patch 1   omeprazole (PRILOSEC) 40 MG capsule Take 1 tab every morning. 90 capsule 1   saccharomyces boulardii (FLORASTOR) 250 MG capsule Take 1 capsule (250 mg total) by mouth 2 (two) times daily. 60 capsule 0   Semaglutide, 1 MG/DOSE, 4 MG/3ML SOPN Inject 1 mg into the skin once a week as directed. 3 mL 6   buPROPion (WELLBUTRIN SR) 150 MG 12 hr tablet Take 1 tablet (150 mg total) by mouth 2 (two) times daily. (Patient not taking: Reported on 09/02/2022) 180 tablet 1   hydrOXYzine (ATARAX/VISTARIL) 25 MG tablet Take 1 tablet (25 mg total) by mouth 3 (three) times daily as needed. (Patient not taking: Reported on 09/02/2022) 270 tablet 1   hyoscyamine (LEVSIN SL) 0.125 MG SL tablet 1 tablet  sublingual every 4-6 hours (Patient not taking: Reported on 08/17/2022) 30 tablet 2   Lancet Devices (ACCU-CHEK SOFTCLIX) lancets Use as instructed daily. 1 each 5   meloxicam (MOBIC) 15 MG tablet TAKE 1 TABLET (15 MG TOTAL) BY MOUTH DAILY. (Patient not taking: Reported on 09/02/2022) 30 tablet 0   methocarbamol (ROBAXIN) 750 MG tablet Take 1 tablet (750 mg total) by mouth every 8 (eight) hours as needed for muscle spasms. (Patient not taking: Reported on 09/02/2022) 90 tablet 3   Nicotine 21-14-7 MG/24HR KIT Place 1 patch onto the skin as directed. (Patient not taking: Reported on  08/17/2022) 1 kit 0   nicotine polacrilex (NICORETTE) 2 MG gum Take 1 each (2 mg total) by mouth as needed for smoking cessation. (Patient not taking: Reported on 08/17/2022) 100 tablet 2   predniSONE (DELTASONE) 20 MG tablet Take 20 mg by mouth daily as needed. (Patient not taking: Reported on 09/02/2022)     pregabalin (LYRICA) 100 MG capsule Take 1 capsule (100 mg total) by mouth 2 (two) times daily. (Patient not taking: Reported on 09/02/2022) 60 capsule 3   No current facility-administered medications for this visit.    Allergies  Allergen Reactions   Amlodipine     Leg pain, swelling around eyes    Atorvastatin Other (See Comments)     Joint pain   Crestor [Rosuvastatin]     Joint pain    Irbesartan     Leg pain, swelling around eyes   Metformin And Related Nausea And Vomiting   Metronidazole Nausea And Vomiting   Penicillins     childhood   Xanax [Alprazolam]     "Caused upper respiratory symptoms" per pt   Glyburide Other (See Comments)    "blood sugar dropped uncontrollably"   Linagliptin Diarrhea and Other (See Comments)    Stomach pain, sinus infection   Moxifloxacin Diarrhea    Past Medical History:  Diagnosis Date   Allergy    Anxiety    Arthritis    Chronic UTI    Clostridium difficile infection    Depression    Diabetes mellitus without complication (HCC)    Diverticulosis     Fibromyalgia    GERD (gastroesophageal reflux disease)    Hiatal hernia    Hyperlipidemia    Hypertension    Internal hemorrhoids    Meningitis, viral    Pancreatitis    Pure hypercholesterolemia 12/23/2020    Blood pressure (!) 150/102, pulse 70, height 5' 3.5" (1.613 m), weight 189 lb (85.7 kg).  HYPERTENSION CONTROL Vitals:   09/02/22 0904 09/02/22 1028  BP: (!) 181/105 (!) 150/102    The patient's blood pressure is elevated above target today.  In order to address the patient's elevated BP: A new medication was prescribed today.      Benign essential hypertension Patient with essential hypertension, hard to treat secondary to multiple medication intolerances.  Unsure how many of these are true intolerances vs ongoing health issues.  Reviewed full history from patient perspective and had long discussion over how BP medications work and associated side effects.  Explained the need to try meds for 10-14 days uninterrupted to determine effectiveness.  Since she has stopped all BP medications will have her start back with irbesartan 150 mg once daily.  Stressed need to take at the same time daily and check home BP readings twice daily, again at consistent times.  Gave full instructions for home BP checks.  She will repeat BMET in 2 weeks and I will see her back in 4 weeks for follow up.    Tobacco abuse Reviewed dangers of smoking, especially the toxins that are found in cigarettes.  Explained that thing such as arsenic, benzene and lead are much more toxic to the body than anything in her BP medications.  Reviewed effect of nicotine on BP control as well.  She was encouraged to go back to the nicotine patches and endorsed that this is not an easy habit to kick, but in the long run will give her greater benefits.  Will have her go back to 14 mg patches for up  to 6 weeks, then 7 mg.  Also encouraged her to take the patches off in the evenings, as nicotine is commonly known to disrupt  sleep.     Tommy Medal PharmD C[facility] [address] [place], [place] [address] [phone]

## 2022-09-02 NOTE — Patient Instructions (Addendum)
Return for a a follow up appointment Wednesday Dec 13 at 9:30 am  Go to the lab in 2 weeks (week of Thanksgiving or early the week after)  Check your blood pressure at home twice daily (morning and evening) and keep record of the readings.  Take your BP meds as follows:  Start irbesartan 150 mg once daily.     Start Nicotine patches 14 mg daily - can use for up to 6 weeks before dropping to 7 mg dose.      Bring all of your meds, your BP cuff and your record of home blood pressures to your next appointment.  Exercise as you're able, try to walk approximately 30 minutes per day.  Keep salt intake to a minimum, especially watch canned and prepared boxed foods.  Eat more fresh fruits and vegetables and fewer canned items.  Avoid eating in fast food restaurants.    HOW TO TAKE YOUR BLOOD PRESSURE: Rest 5 minutes before taking your blood pressure.  Don't smoke or drink caffeinated beverages for at least 30 minutes before. Take your blood pressure before (not after) you eat. Sit comfortably with your back supported and both feet on the floor (don't cross your legs). Elevate your arm to heart level on a table or a desk. Use the proper sized cuff. It should fit smoothly and snugly around your bare upper arm. There should be enough room to slip a fingertip under the cuff. The bottom edge of the cuff should be 1 inch above the crease of the elbow. Ideally, take 3 measurements at one sitting and record the average.

## 2022-09-04 ENCOUNTER — Encounter: Payer: Self-pay | Admitting: Pharmacist Clinician (PhC)/ Clinical Pharmacy Specialist

## 2022-09-10 ENCOUNTER — Other Ambulatory Visit: Payer: Self-pay | Admitting: Family Medicine

## 2022-09-10 DIAGNOSIS — M79651 Pain in right thigh: Secondary | ICD-10-CM

## 2022-09-10 DIAGNOSIS — M25551 Pain in right hip: Secondary | ICD-10-CM

## 2022-09-10 NOTE — Telephone Encounter (Signed)
Requested Prescriptions  Pending Prescriptions Disp Refills   meloxicam (MOBIC) 15 MG tablet [Pharmacy Med Name: MELOXICAM 15 MG TABLET] 90 tablet 1    Sig: TAKE 1 TABLET (15 MG TOTAL) BY MOUTH DAILY.     Analgesics:  COX2 Inhibitors Failed - 09/10/2022  1:55 AM      Failed - Manual Review: Labs are only required if the patient has taken medication for more than 8 weeks.      Passed - HGB in normal range and within 360 days    Hemoglobin  Date Value Ref Range Status  02/20/2022 12.7 12.0 - 15.0 g/dL Final  06/01/2019 11.8 11.1 - 15.9 g/dL Final         Passed - Cr in normal range and within 360 days    Creat  Date Value Ref Range Status  10/28/2016 0.64 0.50 - 1.10 mg/dL Final   Creatinine, Ser  Date Value Ref Range Status  08/14/2022 0.89 0.57 - 1.00 mg/dL Final   Creatinine, Urine  Date Value Ref Range Status  10/28/2016 158 20 - 320 mg/dL Final         Passed - HCT in normal range and within 360 days    HCT  Date Value Ref Range Status  02/20/2022 38.7 36.0 - 46.0 % Final   Hematocrit  Date Value Ref Range Status  06/01/2019 37.8 34.0 - 46.6 % Final         Passed - AST in normal range and within 360 days    AST  Date Value Ref Range Status  08/14/2022 12 0 - 40 IU/L Final         Passed - ALT in normal range and within 360 days    ALT  Date Value Ref Range Status  08/14/2022 10 0 - 32 IU/L Final         Passed - eGFR is 30 or above and within 360 days    GFR, Est African American  Date Value Ref Range Status  10/28/2016 >89 >=60 mL/min Final   GFR calc Af Amer  Date Value Ref Range Status  12/12/2020 127 >59 mL/min/1.73 Final    Comment:    **In accordance with recommendations from the NKF-ASN Task force,**   [facility] is in the process of updating its eGFR calculation to the   2021 CKD-EPI creatinine equation that estimates kidney function   without a race variable.    GFR, Est Non African American  Date Value Ref Range Status  10/28/2016 >89  >=60 mL/min Final   GFR calc non Af Amer  Date Value Ref Range Status  12/12/2020 110 >59 mL/min/1.73 Final   GFR  Date Value Ref Range Status  02/20/2022 106.09 >60.00 mL/min Final    Comment:    Calculated using the CKD-EPI Creatinine Equation (2021)   eGFR  Date Value Ref Range Status  08/14/2022 81 >59 mL/min/1.73 Final         Passed - Patient is not pregnant      Passed - Valid encounter within last 12 months    Recent Outpatient Visits           6 months ago Type 2 diabetes mellitus with other neurologic complication, without long-term current use of insulin ([place])   [place], [place], MD   11 months ago Type 2 diabetes mellitus with other neurologic complication, without long-term current use of insulin ([place])   [place],  Charlane Ferretti, MD   1 year ago Acute cystitis without hematuria   [place] Tullos, Broadus John, FNP   1 year ago Type 2 diabetes mellitus with other neurologic complication, without long-term current use of insulin [facility])   [place] Charlott Rakes, MD   1 year ago Medicare annual wellness visit, subsequent   [place], FNP       Future Appointments             In 1 week Charlott Rakes, MD [place]   In 3 weeks  [place]. [place]

## 2022-09-11 ENCOUNTER — Other Ambulatory Visit: Payer: Self-pay

## 2022-09-22 ENCOUNTER — Other Ambulatory Visit (HOSPITAL_COMMUNITY): Payer: Self-pay

## 2022-09-22 ENCOUNTER — Telehealth (HOSPITAL_BASED_OUTPATIENT_CLINIC_OR_DEPARTMENT_OTHER): Payer: Self-pay | Admitting: Advanced Practice Midwife

## 2022-09-22 DIAGNOSIS — N939 Abnormal uterine and vaginal bleeding, unspecified: Secondary | ICD-10-CM

## 2022-09-22 MED ORDER — MISOPROSTOL 200 MCG PO TABS: 400.0000 ug | ORAL_TABLET | Freq: Once | ORAL | 0 refills | Status: DC

## 2022-09-22 MED ORDER — CYCLOBENZAPRINE HCL 10 MG PO TABS: 10.0000 mg | ORAL_TABLET | Freq: Once | ORAL | 0 refills | Status: AC

## 2022-09-22 NOTE — Telephone Encounter (Signed)
Jamila Slatten was here at [facility] office today as a support person for her daughter for her Gyn visit.  Ms. Riches expressed interest in IUD placement for heavy menses, and has had counseling about this option at prior encounters.  She has decided to try the IUD and will make an appointment today when she checks out with her daughter. She reports anxiety about procedures so I offered to send medication to take prior to the office visit. Reviewed pt allergies and medical hx.  Rx for Flexeril 10 mg PO and Cytotec 400 mcg PO to take 2 hours prior to her office visit.

## 2022-09-23 ENCOUNTER — Encounter: Payer: Self-pay | Admitting: Family Medicine

## 2022-09-23 ENCOUNTER — Ambulatory Visit: Payer: Medicare Other | Attending: Family Medicine | Admitting: Family Medicine

## 2022-09-23 VITALS — BP 150/80 | HR 76 | Temp 98.2°F | Ht 63.0 in | Wt 181.4 lb

## 2022-09-23 DIAGNOSIS — I10 Essential (primary) hypertension: Secondary | ICD-10-CM | POA: Diagnosis not present

## 2022-09-23 DIAGNOSIS — F419 Anxiety disorder, unspecified: Secondary | ICD-10-CM | POA: Diagnosis not present

## 2022-09-23 DIAGNOSIS — Z79899 Other long term (current) drug therapy: Secondary | ICD-10-CM | POA: Insufficient documentation

## 2022-09-23 DIAGNOSIS — M25559 Pain in unspecified hip: Secondary | ICD-10-CM | POA: Insufficient documentation

## 2022-09-23 DIAGNOSIS — F32A Depression, unspecified: Secondary | ICD-10-CM | POA: Diagnosis not present

## 2022-09-23 DIAGNOSIS — I152 Hypertension secondary to endocrine disorders: Secondary | ICD-10-CM

## 2022-09-23 DIAGNOSIS — Z789 Other specified health status: Secondary | ICD-10-CM

## 2022-09-23 DIAGNOSIS — E1149 Type 2 diabetes mellitus with other diabetic neurological complication: Secondary | ICD-10-CM | POA: Insufficient documentation

## 2022-09-23 DIAGNOSIS — E1159 Type 2 diabetes mellitus with other circulatory complications: Secondary | ICD-10-CM | POA: Diagnosis not present

## 2022-09-23 DIAGNOSIS — E1169 Type 2 diabetes mellitus with other specified complication: Secondary | ICD-10-CM | POA: Diagnosis not present

## 2022-09-23 DIAGNOSIS — R202 Paresthesia of skin: Secondary | ICD-10-CM | POA: Insufficient documentation

## 2022-09-23 DIAGNOSIS — R262 Difficulty in walking, not elsewhere classified: Secondary | ICD-10-CM | POA: Diagnosis not present

## 2022-09-23 DIAGNOSIS — M79652 Pain in left thigh: Secondary | ICD-10-CM | POA: Diagnosis not present

## 2022-09-23 DIAGNOSIS — Z87891 Personal history of nicotine dependence: Secondary | ICD-10-CM | POA: Diagnosis not present

## 2022-09-23 DIAGNOSIS — T466X5A Adverse effect of antihyperlipidemic and antiarteriosclerotic drugs, initial encounter: Secondary | ICD-10-CM | POA: Insufficient documentation

## 2022-09-23 DIAGNOSIS — M79651 Pain in right thigh: Secondary | ICD-10-CM | POA: Diagnosis not present

## 2022-09-23 DIAGNOSIS — E785 Hyperlipidemia, unspecified: Secondary | ICD-10-CM | POA: Diagnosis not present

## 2022-09-23 DIAGNOSIS — M797 Fibromyalgia: Secondary | ICD-10-CM | POA: Diagnosis not present

## 2022-09-23 DIAGNOSIS — M79671 Pain in right foot: Secondary | ICD-10-CM | POA: Diagnosis not present

## 2022-09-23 LAB — POCT GLYCOSYLATED HEMOGLOBIN (HGB A1C): HbA1c, POC (controlled diabetic range): 6 % (ref 0.0–7.0)

## 2022-09-23 MED ORDER — IBUPROFEN 800 MG PO TABS: 800.0000 mg | ORAL_TABLET | Freq: Two times a day (BID) | ORAL | 1 refills | Status: DC | PRN

## 2022-09-23 MED ORDER — PREGABALIN 100 MG PO CAPS: 100.0000 mg | ORAL_CAPSULE | Freq: Two times a day (BID) | ORAL | 5 refills | Status: DC

## 2022-09-23 MED ORDER — OZEMPIC (0.25 OR 0.5 MG/DOSE) 2 MG/3ML ~~LOC~~ SOPN: 2.0000 mg | PEN_INJECTOR | SUBCUTANEOUS | 6 refills | Status: DC

## 2022-09-23 MED ORDER — SEMAGLUTIDE (2 MG/DOSE) 8 MG/3ML ~~LOC~~ SOPN: 2.0000 mg | PEN_INJECTOR | SUBCUTANEOUS | 6 refills | Status: DC

## 2022-09-23 MED ORDER — OMEPRAZOLE 40 MG PO CPDR: DELAYED_RELEASE_CAPSULE | ORAL | 1 refills | Status: DC

## 2022-09-23 NOTE — Progress Notes (Signed)
Subjective:  Patient ID: Amber Rose, female    DOB: 01-Nov-1975  Age: 46 y.o. MRN: [id number]  CC: Diabetes   HPI Amber Rose is a 46 y.o. year old female with a history of type 2 diabetes mellitus (A1c 6.0), hypertension, depression and anxiety, hydradenitis, tobacco abuse, fibromyalgia seen for a follow up visit.   Interval History:  Blood pressure is elevated and she has been unable to tolerate multiple antihypertensives.  Most recently she was placed on irbesartan by the cardiology hypertension clinic which she discontinued as she felt it was causing more ' inflammation and pain in her thighs with associated swelling'. She said she has felt great since coming off her Irbesartan.   She quit smoking Cigarettes a week ago.  She needs a handicap form due to the fact that she has intermittent cramps in her thighs and when this occurs she is unable to walk long distances.  She has attributed this to several of her medications in the past but the symptoms still occur intermittently. She is statin intolerant as she did have myalgias which she attributed to statin and is on Repatha but states she has not been consistent with taking it. She requested referral to podiatry for intermittent right foot pain and also complains that she sometimes feels her right foot getting everted. Past Medical History:  Diagnosis Date   Allergy    Anxiety    Arthritis    Chronic UTI    Clostridium difficile infection    Depression    Diabetes mellitus without complication ([place])    Diverticulosis    Fibromyalgia    GERD (gastroesophageal reflux disease)    Hiatal hernia    Hyperlipidemia    Hypertension    Internal hemorrhoids    Meningitis, viral    Pancreatitis    Pure hypercholesterolemia 12/23/2020    Past Surgical History:  Procedure Laterality Date   APPENDECTOMY  1990   ceasarian  2002   CESAREAN SECTION     x1   CHOLECYSTECTOMY  2011   COLONOSCOPY     UPPER  GASTROINTESTINAL ENDOSCOPY     URINARY SURGERY     urethra sling, removal, and then revision 2016 (4 surgeries)   WISDOM TOOTH EXTRACTION      Family History  Problem Relation Age of Onset   Cancer Mother        cervical, breast, lungm skin, bladder-ex smoker   Hypertension Mother    Diabetes Father    Heart attack Maternal Grandfather    Multiple sclerosis Paternal Grandmother    Allergic rhinitis Neg Hx    Angioedema Neg Hx    Asthma Neg Hx    Eczema Neg Hx    Urticaria Neg Hx    Colon cancer Neg Hx    Esophageal cancer Neg Hx    Liver disease Neg Hx    Pancreatic cancer Neg Hx    Prostate cancer Neg Hx    Rectal cancer Neg Hx    Stomach cancer Neg Hx    Colon polyps Neg Hx     Social History   Socioeconomic History   Marital status: Single    Spouse name: Not on file   Number of children: 3   Years of education: Not on file   Highest education level: Not on file  Occupational History   Not on file  Tobacco Use   Smoking status: Every Day    Packs/day: 0.10    Years: 20.00    Total  pack years: 2.00    Types: Cigarettes   Smokeless tobacco: Never   Tobacco comments:    Patient is engaged in health coaching for smoking cessation as of 12/26/20  Vaping Use   Vaping Use: Never used  Substance and Sexual Activity   Alcohol use: Yes    Comment: occ   Drug use: Not Currently    Types: Marijuana   Sexual activity: Never    Comment: pt on her mentral cycle now began 11-14-17  Other Topics Concern   Not on file  Social History Narrative   Lives home with adult niece.  Not working.  Disability pending.  Pt is single.  Education GED.  3 children.    Social Determinants of Health   Financial Resource Strain: Not on file  Food Insecurity: Food Insecurity Present (01/20/2021)   Hunger Vital Sign    Worried About Running Out of Food in the Last Year: Often true    Ran Out of Food in the Last Year: Sometimes true  Transportation Needs: No Transportation Needs  (01/20/2021)   PRAPARE - Hydrologist (Medical): No    Lack of Transportation (Non-Medical): No  Physical Activity: Not on file  Stress: Not on file  Social Connections: Not on file    Allergies  Allergen Reactions   Amlodipine     Leg pain, swelling around eyes    Atorvastatin Other (See Comments)     Joint pain   Crestor [Rosuvastatin]     Joint pain    Irbesartan     Leg pain, swelling around eyes   Metformin And Related Nausea And Vomiting   Metronidazole Nausea And Vomiting   Penicillins     childhood   Xanax [Alprazolam]     "Caused upper respiratory symptoms" per pt   Glyburide Other (See Comments)    "blood sugar dropped uncontrollably"   Linagliptin Diarrhea and Other (See Comments)    Stomach pain, sinus infection   Moxifloxacin Diarrhea    Outpatient Medications Prior to Visit  Medication Sig Dispense Refill   acyclovir (ZOVIRAX) 400 MG tablet Take 1 tablet (400 mg total) by mouth 2 (two) times daily. 180 tablet 0   albuterol (PROAIR HFA) 108 (90 Base) MCG/ACT inhaler Inhale 2 puffs into the lungs every 4 (four) hours as needed for wheezing or shortness of breath. 3 Inhaler 0   Blood Glucose Monitoring Suppl (ACCU-CHEK AVIVA PLUS) w/Device KIT USE AS DIRECTED DAILY. E11.9 1 kit 0   Cholecalciferol (VITAMIN D) 125 MCG (5000 UT) CAPS Take 1 capsule by mouth daily at 2 PM.     doxazosin (CARDURA) 2 MG tablet Take 1 tablet (2 mg total) by mouth daily. 30 tablet 2   Evolocumab (REPATHA SURECLICK) 161 MG/ML SOAJ Inject 140 mg into the skin every 14 (fourteen) days. 2 mL 11   hydrALAZINE (APRESOLINE) 25 MG tablet Take 1 tablet (25 mg total) by mouth in the morning and at bedtime. 180 tablet 3   hyoscyamine (LEVSIN SL) 0.125 MG SL tablet 1 tablet sublingual every 4-6 hours 30 tablet 2   irbesartan (AVAPRO) 150 MG tablet Take 1 tablet (150 mg total) by mouth daily. 30 tablet 3   Lancet Devices (ACCU-CHEK SOFTCLIX) lancets Use as instructed  daily. 1 each 5   methocarbamol (ROBAXIN) 750 MG tablet Take 1 tablet (750 mg total) by mouth every 8 (eight) hours as needed for muscle spasms. 90 tablet 3   Multiple Vitamin (MULTI-VITAMIN DAILY) TABS  Take 1 tablet by mouth daily as needed.     nicotine (NICODERM CQ - DOSED IN MG/24 HOURS) 14 mg/24hr patch Place 1 patch (14 mg total) onto the skin daily. 28 patch 1   Nicotine 21-14-7 MG/24HR KIT Place 1 patch onto the skin as directed. 1 kit 0   nicotine polacrilex (NICORETTE) 2 MG gum Take 1 each (2 mg total) by mouth as needed for smoking cessation. 100 tablet 2   saccharomyces boulardii (FLORASTOR) 250 MG capsule Take 1 capsule (250 mg total) by mouth 2 (two) times daily. 60 capsule 0   buPROPion (WELLBUTRIN SR) 150 MG 12 hr tablet Take 1 tablet (150 mg total) by mouth 2 (two) times daily. 180 tablet 1   hydrOXYzine (ATARAX/VISTARIL) 25 MG tablet Take 1 tablet (25 mg total) by mouth 3 (three) times daily as needed. 270 tablet 1   meloxicam (MOBIC) 15 MG tablet TAKE 1 TABLET (15 MG TOTAL) BY MOUTH DAILY. 90 tablet 1   omeprazole (PRILOSEC) 40 MG capsule Take 1 tab every morning. 90 capsule 1   pregabalin (LYRICA) 100 MG capsule Take 1 capsule (100 mg total) by mouth 2 (two) times daily. 60 capsule 3   Semaglutide, 1 MG/DOSE, 4 MG/3ML SOPN Inject 1 mg into the skin once a week as directed. 3 mL 6   misoprostol (CYTOTEC) 200 MCG tablet Take 2 tablets (400 mcg total) by mouth once for 1 dose. Take 2 tablets, 400 mcg, all at one time, approximately 2 hours before your procedure. 2 tablet 0   predniSONE (DELTASONE) 20 MG tablet Take 20 mg by mouth daily as needed. (Patient not taking: Reported on 09/23/2022)     No facility-administered medications prior to visit.     ROS Review of Systems  Constitutional:  Negative for activity change and appetite change.  HENT:  Negative for sinus pressure and sore throat.   Respiratory:  Negative for chest tightness, shortness of breath and wheezing.    Cardiovascular:  Negative for chest pain and palpitations.  Gastrointestinal:  Negative for abdominal distention, abdominal pain and constipation.  Genitourinary: Negative.   Musculoskeletal: Negative.   Psychiatric/Behavioral:  Negative for behavioral problems and dysphoric mood.     Objective:  BP (!) 150/80   Pulse 76   Temp 98.2 F (36.8 C) (Oral)   Ht _0  (1.6 m)   Wt 181 lb 6.4 oz (82.3 kg)   SpO2 100%   BMI 32.13 kg/m      09/23/2022    9:09 AM 09/02/2022   10:28 AM 09/02/2022    9:04 AM  BP/Weight  Systolic BP 409 811 914  Diastolic BP 80 782 956  Wt. (Lbs) 181.4  189  BMI 32.13 kg/m2  32.95 kg/m2      Physical Exam Constitutional:      Appearance: She is well-developed.  Cardiovascular:     Rate and Rhythm: Normal rate.     Heart sounds: Normal heart sounds. No murmur heard. Pulmonary:     Effort: Pulmonary effort is normal.     Breath sounds: Normal breath sounds. No wheezing or rales.  Chest:     Chest wall: No tenderness.  Abdominal:     General: Bowel sounds are normal. There is no distension.     Palpations: Abdomen is soft. There is no mass.     Tenderness: There is no abdominal tenderness.  Musculoskeletal:        General: Normal range of motion.     Right  lower leg: No edema.     Left lower leg: No edema.  Neurological:     Mental Status: She is alert and oriented to person, place, and time.  Psychiatric:        Mood and Affect: Mood normal.        Latest Ref Rng & Units 08/14/2022    2:31 PM 02/20/2022    9:45 AM 01/29/2022    9:15 AM  CMP  Glucose 70 - 99 mg/dL 99  108    BUN 6 - 24 mg/dL 14  10    Creatinine 0.57 - 1.00 mg/dL 0.89  0.64    Sodium 134 - 144 mmol/L 139  135    Potassium 3.5 - 5.2 mmol/L 4.0  4.1    Chloride 96 - 106 mmol/L 105  102    CO2 20 - 29 mmol/L 23  25    Calcium 8.7 - 10.2 mg/dL 9.0  9.3    Total Protein 6.0 - 8.5 g/dL 6.6  7.4  6.4   Total Bilirubin 0.0 - 1.2 mg/dL <0.2  0.4  <0.2   Alkaline Phos 44  - 121 IU/L 80  96  123   AST 0 - 40 IU/L _0 ALT 0 - 32 IU/L _1 Lipid Panel     Component Value Date/Time   CHOL 233 (H) 01/29/2022 0914   TRIG 124 01/29/2022 0914   HDL 63 01/29/2022 0914   CHOLHDL 3.7 01/29/2022 0914   CHOLHDL 4.4 10/28/2016 0851   LDLCALC 148 (H) 01/29/2022 0914   LDLDIRECT 51 08/14/2022 1431    CBC    Component Value Date/Time   WBC 12.0 (H) 02/20/2022 0945   RBC 4.36 02/20/2022 0945   HGB 12.7 02/20/2022 0945   HGB 11.8 06/01/2019 1532   HCT 38.7 02/20/2022 0945   HCT 37.8 06/01/2019 1532   PLT 389.0 02/20/2022 0945   PLT 413 06/01/2019 1532   MCV 88.8 02/20/2022 0945   MCV 83 06/01/2019 1532   MCH 25.8 (L) 06/01/2019 1532   MCH 28.0 10/12/2016 0517   MCHC 32.9 02/20/2022 0945   RDW 16.0 (H) 02/20/2022 0945   RDW 17.6 (H) 06/01/2019 1532   LYMPHSABS 2.0 02/20/2022 0945   LYMPHSABS 2.8 06/01/2019 1532   MONOABS 0.7 02/20/2022 0945   EOSABS 0.1 02/20/2022 0945   EOSABS 0.1 06/01/2019 1532   BASOSABS 0.1 02/20/2022 0945   BASOSABS 0.1 06/01/2019 1532    Lab Results  Component Value Date   HGBA1C 6.0 09/23/2022    Assessment & Plan:  1. Type 2 diabetes mellitus with other neurologic complication, without long-term current use of insulin (HCC) Controlled with A1c of 6.0 Ozempic dose increased due to greater weight loss benefit Counseled on blood pressure goal of less than 130/80, low-sodium, DASH diet, medication compliance, 150 minutes of moderate intensity exercise per week. Discussed medication compliance, adverse effects. - POCT glycosylated hemoglobin (Hb A1C) - Ambulatory referral to Podiatry - Microalbumin/Creatinine Ratio, Urine - Semaglutide, 2 MG/DOSE, 8 MG/3ML SOPN; Inject 2 mg as directed once a week.  Dispense: 3 mL; Refill: 6  2. Right foot pain - Ambulatory referral to Podiatry  3. Hip pain Pain is absent at the moment - pregabalin (LYRICA) 100 MG capsule; Take 1 capsule (100 mg total) by mouth 2  (two) times daily.  Dispense: 60 capsule; Refill: 5  4. Paresthesias Controlled on Lyrica - pregabalin (LYRICA) 100  MG capsule; Take 1 capsule (100 mg total) by mouth 2 (two) times daily.  Dispense: 60 capsule; Refill: 5  5. Pain in both thighs This is intermittent She does have an underlying history of fibromyalgia which could be presenting in her multiple symptoms and is greatly impacting her tolerance to multiple antihypertensives Encouraged to continue with exercises like yoga She would prefer ibuprofen instead of meloxicam and I have changed her prescription as requested - ibuprofen (ADVIL) 800 MG tablet; Take 1 tablet (800 mg total) by mouth every 12 (twelve) hours as needed.  Dispense: 60 tablet; Refill: 1 - pregabalin (LYRICA) 100 MG capsule; Take 1 capsule (100 mg total) by mouth 2 (two) times daily.  Dispense: 60 capsule; Refill: 5  6. Hypertension associated with diabetes ([place]) Uncontrolled Unfortunately intolerance to multiple antihypertensive largely contributing She is managed by the advanced hypertension clinic and has an appointment with cardiology in 2 weeks  7. Hyperlipidemia associated with type 2 diabetes mellitus (HCC) Statin intolerant Encouraged to be adherent with Repatha Will check lipid panel at next visit as she has not been adherent with Repatha and lipids are likely to be elevated  8. Statin intolerance Associated myalgias with statins   Meds ordered this encounter  Medications   ibuprofen (ADVIL) 800 MG tablet    Sig: Take 1 tablet (800 mg total) by mouth every 12 (twelve) hours as needed.    Dispense:  60 tablet    Refill:  1   DISCONTD: Semaglutide,0.25 or 0.5MG/DOS, (OZEMPIC, 0.25 OR 0.5 MG/DOSE,) 2 MG/3ML SOPN    Sig: Inject 2 mg into the skin once a week.    Dispense:  3 mL    Refill:  6    Dose increased   Semaglutide, 2 MG/DOSE, 8 MG/3ML SOPN    Sig: Inject 2 mg as directed once a week.    Dispense:  3 mL    Refill:  6    Dose increase    omeprazole (PRILOSEC) 40 MG capsule    Sig: Take 1 tab every morning.    Dispense:  90 capsule    Refill:  1   pregabalin (LYRICA) 100 MG capsule    Sig: Take 1 capsule (100 mg total) by mouth 2 (two) times daily.    Dispense:  60 capsule    Refill:  5    Follow-up: Return in about 6 months (around 03/24/2023) for Chronic medical conditions.       Charlott Rakes, MD, FAAFP. [facility] [place], [place]   09/23/2022, 12:12 PM

## 2022-09-23 NOTE — Patient Instructions (Signed)

## 2022-09-23 NOTE — Progress Notes (Signed)
No BP medication in 2 weeks. Handicap form. Referral to podiatry

## 2022-09-25 LAB — MICROALBUMIN / CREATININE URINE RATIO
Creatinine, Urine: 50.2 mg/dL
Microalb/Creat Ratio: 7 mg/g creat (ref 0–29)
Microalbumin, Urine: 3.3 ug/mL

## 2022-10-01 ENCOUNTER — Encounter: Payer: Self-pay | Admitting: Podiatry

## 2022-10-01 ENCOUNTER — Ambulatory Visit (INDEPENDENT_AMBULATORY_CARE_PROVIDER_SITE_OTHER): Payer: Medicare Other | Admitting: Podiatry

## 2022-10-01 ENCOUNTER — Ambulatory Visit (INDEPENDENT_AMBULATORY_CARE_PROVIDER_SITE_OTHER): Payer: Medicare Other

## 2022-10-01 VITALS — BP 98/76

## 2022-10-01 DIAGNOSIS — G629 Polyneuropathy, unspecified: Secondary | ICD-10-CM

## 2022-10-01 DIAGNOSIS — M779 Enthesopathy, unspecified: Secondary | ICD-10-CM | POA: Diagnosis not present

## 2022-10-01 DIAGNOSIS — M722 Plantar fascial fibromatosis: Secondary | ICD-10-CM

## 2022-10-02 NOTE — Progress Notes (Signed)
Subjective:   Patient ID: Amber Rose, female   DOB: 46 y.o.   MRN: [id number]   HPI Patient states she has had right foot pain for years and thinks it may have been related to amlodipine and Lipitor she was taking.  States her leg has gotten some better but she still has foot problems and gets shooting pains that go into her leg and she is wondering if there from her foot.  Patient smokes slightly and is not active   Review of Systems  All other systems reviewed and are negative.       Objective:  Physical Exam Vitals and nursing note reviewed.  Constitutional:      Appearance: She is well-developed.  Pulmonary:     Effort: Pulmonary effort is normal.  Musculoskeletal:        General: Normal range of motion.  Skin:    General: Skin is warm.  Neurological:     Mental Status: She is alert.     Neurovascular status is found to be intact and I did not note any current loss of muscle strength or range of motion.  Patient does have discomfort dorsal right foot and into the right ankle and sinus tarsi with no loss of motion associated with it.  Patient has good digital perfusion well-oriented x 3     Assessment:  Good probability that she is dealing more of a problem with her back and I do not think foot issues are going to cause proximal symptoms like she is experiencing.     Plan:  H&P reviewed x-rays discussed at great length this appears to be more of a nerve condition and most likely coming from her back and I recommended having the evaluation of that done.  Other than that I did offer her steroid injection for foot discomfort she denies wanting it currently  X-rays indicate that there is no signs of advanced arthritis depression of the arch for previous fracture right foot

## 2022-10-07 ENCOUNTER — Ambulatory Visit
Payer: Medicaid Other | Attending: Cardiovascular Disease | Admitting: Pharmacist Clinician (PhC)/ Clinical Pharmacy Specialist

## 2022-10-07 ENCOUNTER — Encounter (HOSPITAL_BASED_OUTPATIENT_CLINIC_OR_DEPARTMENT_OTHER): Payer: Self-pay | Admitting: Pharmacist Clinician (PhC)/ Clinical Pharmacy Specialist

## 2022-10-07 VITALS — BP 154/102 | HR 73 | Ht 64.0 in | Wt 180.6 lb

## 2022-10-07 DIAGNOSIS — I10 Essential (primary) hypertension: Secondary | ICD-10-CM

## 2022-10-07 MED ORDER — SPIRONOLACTONE 25 MG PO TABS: 12.5000 mg | ORAL_TABLET | Freq: Every day | ORAL | 3 refills | Status: DC

## 2022-10-07 NOTE — Patient Instructions (Signed)
Go to the lab in 2 weeks to check kidney function and potassium level  Take your BP meds as follows:  Try spironolactone 12.5 mg (1/2 tablet) once daily.      Check your blood pressure at home daily as able,  and keep record of the readings.  Hypertension "High blood pressure"  Hypertension is often called "The Silent Killer." It rarely causes symptoms until it is extremely  high or has done damage to other organs in the body. For this reason, you should have your  blood pressure checked regularly by your physician. We will check your blood pressure  every time you see a provider at one of our offices.   Your blood pressure reading consists of two numbers. Ideally, blood pressure should be  below 120/80. The first ("top") number is called the systolic pressure. It measures the  pressure in your arteries as your heart beats. The second ("bottom") number is called the diastolic pressure. It measures the pressure in your arteries as the heart relaxes between beats.  The benefits of getting your blood pressure under control are enormous. A 10-point  reduction in systolic blood pressure can reduce your risk of stroke by 27% and heart failure by 28%  Your blood pressure goal is <130/80  To check your pressure at home you will need to:  1. Sit up in a chair, with feet flat on the floor and back supported. Do not cross your ankles or legs. 2. Rest your left arm so that the cuff is about heart level. If the cuff goes on your upper arm,  then just relax the arm on the table, arm of the chair or your lap. If you have a wrist cuff, we  suggest relaxing your wrist against your chest (think of it as Pledging the Flag with the  wrong arm).  3. Place the cuff snugly around your arm, about 1 inch above the crook of your elbow. The  cords should be inside the groove of your elbow.  4. Sit quietly, with the cuff in place, for about 5 minutes. After that 5 minutes press the power  button to start a  reading. 5. Do not talk or move while the reading is taking place.  6. Record your readings on a sheet of paper. Although most cuffs have a memory, it is often  easier to see a pattern developing when the numbers are all in front of you.  7. You can repeat the reading after 1-3 minutes if it is recommended  Make sure your bladder is empty and you have not had caffeine or tobacco within the last 30 min  Always bring your blood pressure log with you to your appointments. If you have not brought your monitor in to be double checked for accuracy, please bring it to your next appointment.  You can find a list of quality blood pressure cuffs at [email or web address]

## 2022-10-07 NOTE — Progress Notes (Unsigned)
10/07/2022 Amber Rose [birth year] [id number]   HPI:  Amber Rose is a 46 y.o. female patient of Dr Oval Linsey, with a [place] below who presents today for advanced hypertension clinic follow up.  Patient was referred to our clinic by Dr Margarita Rana in early 2022 for hypertension associated with multiple medication intolerances.  She had moved to [place] from the [place] and states that all of her current problems began about 6 years ago (in [place]) when she was put on a cholesterol medication and 2 blood pressure medications, all at the same time.  She developed severe leg pains that at times left her unable to walk.  She also developed facial swelling that comes and goes, she believes in relation to her BP readings.  She states that she continued on statin drugs for most of that time and nobody associated it with her leg pains, and once she finally stopped, the pains decreased by 60-70%.  Earlier this summer/fall she was on amlodipine and irbesartan for BP control.  She notes that she missed the irbesartan for a few days, as the pharmacy was trying to get a refill.  A few days later she also stopped the amlodipine because of a change in work schedule and forgot the medication.  States that her leg pains immediately lessened.  She was most recently seen by Laurann Montana NP after multiple My Chart messages with complaints of side effects and concerns.  Her pressure that day was fine and she was asked to continue with hydralazine 25 mg twice daily.  A few days later, after complaints of headache, hot flashes and increase in leg pain, she was given the choice to continue with/without doxazosin 2 mg daily.    I saw her in early November and the information above is my summation.  She believes that the irbesartan was not problematic, as she states she felt no different until a few days later when she stopped the amlodipine.  Played devils advocate and suggested that it could easily have been the  irbesartan that was problematic and it took several days to get out of her system.  She conceded that possibility, but is willing to try again.  Discussed the different mechanisms of how BP is controlled and why we don't expect the reactions she notes to clear so quickly after stopping a particular medication.   She has not taken the hydralazine or doxazosin for the past 3 days.  Still having some facial swelling, unsure at this time if it is related to any medication issues, fibromyalgia or perhaps hormonal changes.    At that visit in November I restarted her on the irbesartan 150 mg daily.  Two days later she sent a MyChart message noting that her leg pain returned immediately after starting the medication.  She believed it was due to medications "tightening and relaxing blood vessels", and asked for a medication that would not do that.  Replied with explanation of vasodilation as a means of lowering BP and encouraged her to continue, but was willing to try chlorthalidone.  After another week she stated that she had stopped the irbesartan and was instead focusing on diet, exercise and tobacco cessation.  On Nov 29 she was seen by Dr. Margarita Rana with a BP of 150/80, then saw podiatry last week with a pressure of 98/76.  Today she returns for follow up.  She states white coat hypertension is noticeable, in that she can feel her face swelling as she gets into the  building.  She has also been tracking her symptoms in relation to her menstrual cycle and believes her BP to worsen during her periods.  Most recently she has developed sciatica on the right side, which is causing her some discomfort.  Since her last visit she decided to work on lifestyle modifications and has cut back from 4-6 to 2 cigarettes daily and has lost about 8 pounds.  States she is making better food choices when she does eat out also.   Past Medical History: HLD 10/23  LDL 51 - on Repatha  DM2 8/22  A1c 7.3 - on Ozempic 1 mg, not significant  weight loss ~6-8 pounds  CAD Aortic atherosclerosis  fibromyalgia   Tobacco abuse States has tried to quit multiple times, down to 4 cigarettes/day     Blood Pressure Goal:  130/80  Current Medications:  none  Family Hx:   father has hypertension, no heart disease; mother has had multiple cancers; doesn't know much about siblings health, kids 23/28/21 - no health issues  Social Hx:    smokes 4 cigarettes/day- had one this morning; over the past month has cut back from 1 ppd, comes and goes; only occasional alcohol; no coffee or tea regularly, no sodas - occasional Mt Dew  Diet:   mix - fast food at least 1-2 times per week; not much for vegetables, eating some fruits, very few vegetables   Admits to one not so great meal per day  Exercise: stretches, yoga pilates You-Tube videos; exercises from therapy; walk, bouncing ball  Home BP readings:   machine many years old - arm cuff   AM 18 readings average 143/88 HR 74  (range 129-168/80-97)  PM 10 readings average 142/93 HR 75  (range 115-171/78-99)  Intolerances:  atorvastatin, rosuvastatin - joint pains  Lisinopril  - cough  Hydralazine - headache  Labs: 10/23:  Na 139, K 4.0, Glu 99, BUN 14, SCr 0.89, GFR 81   Wt Readings from Last 3 Encounters:  10/07/22 180 lb 9.6 oz (81.9 kg)  09/23/22 181 lb 6.4 oz (82.3 kg)  09/02/22 189 lb (85.7 kg)   BP Readings from Last 3 Encounters:  10/07/22 (!) 154/102  10/01/22 98/76  09/23/22 (!) 150/80   Pulse Readings from Last 3 Encounters:  10/07/22 73  09/23/22 76  09/02/22 70    Current Outpatient Medications  Medication Sig Dispense Refill   acyclovir (ZOVIRAX) 400 MG tablet Take 1 tablet (400 mg total) by mouth 2 (two) times daily. 180 tablet 0   albuterol (PROAIR HFA) 108 (90 Base) MCG/ACT inhaler Inhale 2 puffs into the lungs every 4 (four) hours as needed for wheezing or shortness of breath. 3 Inhaler 0   Blood Glucose Monitoring Suppl (ACCU-CHEK AVIVA PLUS) w/Device KIT USE  AS DIRECTED DAILY. E11.9 1 kit 0   Cholecalciferol (VITAMIN D) 125 MCG (5000 UT) CAPS Take 1 capsule by mouth daily at 2 PM.     Evolocumab (REPATHA SURECLICK) 580 MG/ML SOAJ Inject 140 mg into the skin every 14 (fourteen) days. 2 mL 11   ibuprofen (ADVIL) 800 MG tablet Take 1 tablet (800 mg total) by mouth every 12 (twelve) hours as needed. 60 tablet 1   Lancet Devices (ACCU-CHEK SOFTCLIX) lancets Use as instructed daily. 1 each 5   methocarbamol (ROBAXIN) 750 MG tablet Take 1 tablet (750 mg total) by mouth every 8 (eight) hours as needed for muscle spasms. 90 tablet 3   Multiple Vitamin (MULTI-VITAMIN DAILY) TABS Take 1  tablet by mouth daily as needed.     omeprazole (PRILOSEC) 40 MG capsule Take 1 tab every morning. 90 capsule 1   pregabalin (LYRICA) 100 MG capsule Take 1 capsule (100 mg total) by mouth 2 (two) times daily. 60 capsule 5   saccharomyces boulardii (FLORASTOR) 250 MG capsule Take 1 capsule (250 mg total) by mouth 2 (two) times daily. 60 capsule 0   Semaglutide, 2 MG/DOSE, 8 MG/3ML SOPN Inject 2 mg as directed once a week. 3 mL 6   spironolactone (ALDACTONE) 25 MG tablet Take 0.5 tablets (12.5 mg total) by mouth daily. 15 tablet 3   misoprostol (CYTOTEC) 200 MCG tablet Take 2 tablets (400 mcg total) by mouth once for 1 dose. Take 2 tablets, 400 mcg, all at one time, approximately 2 hours before your procedure. 2 tablet 0   Nicotine 21-14-7 MG/24HR KIT Place 1 patch onto the skin as directed. 1 kit 0   nicotine polacrilex (NICORETTE) 2 MG gum Take 1 each (2 mg total) by mouth as needed for smoking cessation. (Patient not taking: Reported on 10/07/2022) 100 tablet 2   No current facility-administered medications for this visit.    Allergies  Allergen Reactions   Amlodipine     Leg pain, swelling around eyes    Atorvastatin Other (See Comments)     Joint pain   Crestor [Rosuvastatin]     Joint pain    Irbesartan     Leg pain, swelling around eyes   Metformin And Related  Nausea And Vomiting   Metronidazole Nausea And Vomiting   Penicillins     childhood   Xanax [Alprazolam]     "Caused upper respiratory symptoms" per pt   Glyburide Other (See Comments)    "blood sugar dropped uncontrollably"   Linagliptin Diarrhea and Other (See Comments)    Stomach pain, sinus infection   Moxifloxacin Diarrhea    Past Medical History:  Diagnosis Date   Allergy    Anxiety    Arthritis    Chronic UTI    Clostridium difficile infection    Depression    Diabetes mellitus without complication (HCC)    Diverticulosis    Fibromyalgia    GERD (gastroesophageal reflux disease)    Hiatal hernia    Hyperlipidemia    Hypertension    Internal hemorrhoids    Meningitis, viral    Pancreatitis    Pure hypercholesterolemia 12/23/2020    Blood pressure (!) 154/102, pulse 73, height _0  (1.626 m), weight 180 lb 9.6 oz (81.9 kg).  HYPERTENSION CONTROL Vitals:   10/07/22 1137 10/07/22 1144  BP: (!) 152/87 (!) 154/102    The patient's blood pressure is elevated above target today. {Click here if intervention needs to be changed Refresh Note :1}  In order to address the patient's elevated BP:       Benign essential hypertension Assessment: BP is un/controlled in office BP 154/102 mmHg;  above the goal (<130/80). Patient has multiple medication sensitivites Praised patient on cutting back to 2 cigarettes per day, encouraged complete stop Denies SOB, palpitation, chest pain, headaches,or swelling Reiterated the importance of regular exercise and low salt diet   Plan:  Start taking spironolactone 12.5 mg daily Patient to keep record of BP readings with heart rate and report to [place] at the next visit Patient to see PharmD in 6 weeks for follow up  Follow up lab(s) BMET in 2 weeks  Tommy Medal PharmD CPP [facility] [address]  [place], [place] [address] [phone]

## 2022-10-07 NOTE — Assessment & Plan Note (Signed)
Assessment: BP is un/controlled in office BP 154/102 mmHg;  above the goal (<130/80). Patient has multiple medication sensitivites Praised patient on cutting back to 2 cigarettes per day, encouraged complete stop Denies SOB, palpitation, chest pain, headaches,or swelling Reiterated the importance of regular exercise and low salt diet   Plan:  Start taking spironolactone 12.5 mg daily Patient to keep record of BP readings with heart rate and report to [place] at the next visit Patient to see PharmD in 6 weeks for follow up  Follow up lab(s) BMET in 2 weeks

## 2022-10-12 ENCOUNTER — Other Ambulatory Visit: Payer: Self-pay | Admitting: Family Medicine

## 2022-10-12 ENCOUNTER — Other Ambulatory Visit: Payer: Self-pay

## 2022-10-12 ENCOUNTER — Encounter: Payer: Self-pay | Admitting: Family Medicine

## 2022-10-12 DIAGNOSIS — M79651 Pain in right thigh: Secondary | ICD-10-CM

## 2022-10-12 DIAGNOSIS — M5416 Radiculopathy, lumbar region: Secondary | ICD-10-CM

## 2022-10-12 DIAGNOSIS — R202 Paresthesia of skin: Secondary | ICD-10-CM

## 2022-10-13 ENCOUNTER — Encounter: Payer: Medicaid Other | Admitting: Advanced Practice Midwife

## 2022-10-29 ENCOUNTER — Other Ambulatory Visit: Payer: Self-pay

## 2022-10-29 NOTE — Telephone Encounter (Signed)
Called and spoke with patient regarding Jennifer's recommendations. Pt has been scheduled for a f/u with Dr. Hilarie Fredrickson on Wednesday, 12/23/22 at 2:30 pm. Pt is aware that this appt information will be available in MyChart. Pt verbalized understanding and had no concerns at the end of the call.

## 2022-11-03 ENCOUNTER — Encounter: Payer: Self-pay | Admitting: Family Medicine

## 2022-11-03 ENCOUNTER — Encounter (HOSPITAL_BASED_OUTPATIENT_CLINIC_OR_DEPARTMENT_OTHER): Payer: Medicaid Other | Admitting: Advanced Practice Midwife

## 2022-11-03 DIAGNOSIS — B9689 Other specified bacterial agents as the cause of diseases classified elsewhere: Secondary | ICD-10-CM

## 2022-11-04 MED ORDER — CLINDAMYCIN HCL 300 MG PO CAPS: 300.0000 mg | ORAL_CAPSULE | Freq: Two times a day (BID) | ORAL | 0 refills | Status: DC

## 2022-11-04 NOTE — Telephone Encounter (Signed)

## 2022-11-06 ENCOUNTER — Encounter: Payer: Self-pay | Admitting: Family Medicine

## 2022-11-06 ENCOUNTER — Other Ambulatory Visit: Payer: Self-pay | Admitting: Pharmacist

## 2022-11-06 DIAGNOSIS — E1149 Type 2 diabetes mellitus with other diabetic neurological complication: Secondary | ICD-10-CM

## 2022-11-06 MED ORDER — FREESTYLE LIBRE 2 SENSOR MISC: 3 refills | Status: DC

## 2022-11-06 MED ORDER — FREESTYLE LIBRE 2 READER DEVI: 0 refills | Status: DC

## 2022-11-11 ENCOUNTER — Other Ambulatory Visit: Payer: Medicaid Other

## 2022-11-17 ENCOUNTER — Encounter (HOSPITAL_BASED_OUTPATIENT_CLINIC_OR_DEPARTMENT_OTHER): Payer: Self-pay | Admitting: Advanced Practice Midwife

## 2022-11-17 ENCOUNTER — Ambulatory Visit (INDEPENDENT_AMBULATORY_CARE_PROVIDER_SITE_OTHER): Payer: 59 | Admitting: Advanced Practice Midwife

## 2022-11-17 VITALS — BP 142/102 | HR 72 | Ht 64.0 in | Wt 172.6 lb

## 2022-11-17 DIAGNOSIS — N951 Menopausal and female climacteric states: Secondary | ICD-10-CM | POA: Diagnosis not present

## 2022-11-17 DIAGNOSIS — R300 Dysuria: Secondary | ICD-10-CM | POA: Diagnosis not present

## 2022-11-17 DIAGNOSIS — F3281 Premenstrual dysphoric disorder: Secondary | ICD-10-CM | POA: Insufficient documentation

## 2022-11-17 DIAGNOSIS — N939 Abnormal uterine and vaginal bleeding, unspecified: Secondary | ICD-10-CM | POA: Diagnosis not present

## 2022-11-17 DIAGNOSIS — I10 Essential (primary) hypertension: Secondary | ICD-10-CM | POA: Diagnosis not present

## 2022-11-17 NOTE — Progress Notes (Signed)
   GYNECOLOGY PROGRESS NOTE  History:  47 y.o. [id number] presents to [facility] office today for problem gyn visit. She initially scheduled appointment for IUD placement, after talking with me at her daughter's gyn appointment about her perimenopausal symptoms and abnormal bleeding.  She reports heavy painful periods and mood swings with her cycle, with day 26 prior to her next period with the most symptoms.  She also reports taking herself off of statin medications and her blood pressure medications recently due to long term side effects from these medications. She is taking her BP at home and reports 140s/90s on average.  She does not currently have a primary care provider.     The following portions of the patient's history were reviewed and updated as appropriate: allergies, current medications, past family history, past medical history, past social history, past surgical history and problem list. Last pap smear on 09/13/2020 was normal, neg HRHPV.  Health Maintenance Due  Topic Date Due   Medicare Annual Wellness (AWV)  Never done   COVID-19 Vaccine (1) Never done   FOOT EXAM  09/17/2022     Review of Systems:  Pertinent items are noted in HPI.   Objective:  Physical Exam Blood pressure (!) 142/102, pulse 72, height '5\' 4"'$  (1.626 m), weight 172 lb 9.6 oz (78.3 kg), last menstrual period 11/09/2021. VS reviewed, nursing note reviewed,  Constitutional: well developed, well nourished, no distress HEENT: normocephalic CV: normal rate Pulm/chest wall: normal effort Breast Exam: deferred Abdomen: soft Neuro: alert and oriented x 3 Skin: warm, dry Psych: affect normal Pelvic exam: Deferred  Assessment & Plan:  1. Dysuria  - Urine Culture  2. PMDD (premenstrual dysphoric disorder) --Pt with severe PMS mood symptoms for several years.  Day 26 of cycle is worst day of symptoms.   --Discussed treatments for PMS/PMDD including hormones, antidepressants, etc. Pt to consider options  but is unsure of medicines at this time.  3. Abnormal uterine bleeding (AUB) --Heavy, regular but shortening cycles, around 21 days currently. --Pt undecided about Mirena IUD today.  Reviewed benefits vs possible side effects today.  Pt still has Rx for premedications sent prior to today's visit.  --Pt to schedule contraceptive visit as needed to discuss options or place IUD if desired  4. Perimenopausal symptom --Hot flashes, mood swings, pt symptoms are improved after stopping statin medications so she desires to continue to see if her body can regulate her symptoms at this time.   5. Benign essential hypertension --Encouraged pt to take BPs on home cuff and follow up with primary care. Continue lifestyle changes with diet and exercise as well.     Return if symptoms worsen or fail to improve.   Fatima Blank, CNM 1:38 PM

## 2022-11-19 LAB — URINE CULTURE

## 2022-11-20 ENCOUNTER — Ambulatory Visit: Payer: Self-pay

## 2022-11-20 NOTE — Telephone Encounter (Signed)
  Chief Complaint: UTI Symptoms: urinary frequency, burning at times, odor, abdominal tenderness Frequency: ongoing for several months but getting worse Pertinent Negatives: NA Disposition: '[]'$ ED /'[x]'$ Urgent Care (no appt availability in office) / '[]'$ Appointment(In office/virtual)/ '[]'$  [facility]/ '[]'$ Home Care/ '[]'$ Refused Recommended Disposition /'[]'$ [facility]/ '[]'$  Follow-up with PCP Additional Notes: pt states she was seen at [facility] on 11/17/22, got results back from urine culture and tried contacting their office but closed at noon today. Pt is upset because sx are getting worse. Advised that the urine culture wasn't bad for UTI but no UA done. Pt states she just completed abx for BV couple of days ago but still having sx. Advised her she can go to UC and be seen. Pt verbalized understanding and scheduled UC appt for 11/21/22 at 0900.   Reason for Disposition  Urinating more frequently than usual (i.e., frequency)  Answer Assessment - Initial Assessment Questions 1. SYMPTOM: "What's the main symptom you're concerned about?" (e.g., frequency, incontinence)     Abdominal tenderness 2. ONSET: "When did the  sx  start?"     Several months ago  4. CAUSE: "What do you think is causing the symptoms?"     Possible UTI  5. OTHER SYMPTOMS: "Do you have any other symptoms?" (e.g., blood in urine, fever, flank pain, pain with urination)     Odor, frequency, burning at times  Protocols used: Urinary Symptoms-A-AH

## 2022-11-20 NOTE — Telephone Encounter (Signed)
Noted  

## 2022-11-21 ENCOUNTER — Ambulatory Visit: Payer: Medicaid Other

## 2022-11-23 ENCOUNTER — Other Ambulatory Visit: Payer: Self-pay | Admitting: Family Medicine

## 2022-11-23 ENCOUNTER — Encounter: Payer: Self-pay | Admitting: *Deleted

## 2022-11-23 DIAGNOSIS — Z8619 Personal history of other infectious and parasitic diseases: Secondary | ICD-10-CM

## 2022-11-23 NOTE — Telephone Encounter (Signed)
Requested Prescriptions  Pending Prescriptions Disp Refills   acyclovir (ZOVIRAX) 400 MG tablet [Pharmacy Med Name: ACYCLOVIR 400 MG TABLET] 180 tablet 0    Sig: TAKE 1 TABLET BY MOUTH TWICE A DAY     Antimicrobials:  Antiviral Agents - Anti-Herpetic Passed - 11/23/2022  1:22 AM      Passed - Valid encounter within last 12 months    Recent Outpatient Visits           2 months ago Type 2 diabetes mellitus with other neurologic complication, without long-term current use of insulin ([place])   [place], Kenneth, MD   9 months ago Type 2 diabetes mellitus with other neurologic complication, without long-term current use of insulin ([place])   [place], [place], MD   1 year ago Type 2 diabetes mellitus with other neurologic complication, without long-term current use of insulin ([place])   [place] Charlott Rakes, MD   1 year ago Acute cystitis without hematuria   [place], [place] John, FNP   1 year ago Type 2 diabetes mellitus with other neurologic complication, without long-term current use of insulin ([place])   [place], Enobong, MD       Future Appointments             In 1 week  [facility] [place], [place]   In 4 months Charlott Rakes, MD [place]

## 2022-11-25 ENCOUNTER — Encounter (HOSPITAL_BASED_OUTPATIENT_CLINIC_OR_DEPARTMENT_OTHER): Payer: Self-pay | Admitting: Advanced Practice Midwife

## 2022-11-29 ENCOUNTER — Other Ambulatory Visit: Payer: Self-pay | Admitting: Cardiovascular Disease

## 2022-12-01 ENCOUNTER — Other Ambulatory Visit: Payer: Self-pay | Admitting: Family Medicine

## 2022-12-01 ENCOUNTER — Encounter (HOSPITAL_BASED_OUTPATIENT_CLINIC_OR_DEPARTMENT_OTHER): Payer: Self-pay | Admitting: Pharmacist Clinician (PhC)/ Clinical Pharmacy Specialist

## 2022-12-01 ENCOUNTER — Ambulatory Visit (INDEPENDENT_AMBULATORY_CARE_PROVIDER_SITE_OTHER): Payer: 59 | Admitting: Pharmacist Clinician (PhC)/ Clinical Pharmacy Specialist

## 2022-12-01 VITALS — BP 128/84 | HR 76 | Ht 63.5 in | Wt 169.4 lb

## 2022-12-01 DIAGNOSIS — M79651 Pain in right thigh: Secondary | ICD-10-CM

## 2022-12-01 DIAGNOSIS — I10 Essential (primary) hypertension: Secondary | ICD-10-CM | POA: Diagnosis not present

## 2022-12-01 DIAGNOSIS — E785 Hyperlipidemia, unspecified: Secondary | ICD-10-CM

## 2022-12-01 NOTE — Assessment & Plan Note (Signed)
Assessment: BP is controlled in office BP 128/84 mmHg;  only slightly above the goal (<130/80). Has switched diet to more plant based and less carbohydrates Not currently on any blood pressure lowering medications Denies SOB, palpitation, chest pain, headaches,or swelling Reiterated the importance of regular exercise and low salt diet   Plan:  Continue with current lifestyle modifications and weight loss Patient to occasionally check BP readings with heart rate and report to [place] at the next visit Patient to follow up with PharmD in 3 months for follow up  Labs ordered today:  CMET, Lipid

## 2022-12-01 NOTE — Patient Instructions (Signed)
Follow up appointment: in 3 months  Go to the lab today to check kidney and liver functions as well as cholesterol  Take your BP meds as follows:  Continue with your lifestyle modifications  Check your blood pressure at home daily (if able) and keep record of the readings.  Hypertension "High blood pressure"  Hypertension is often called "The Silent Killer." It rarely causes symptoms until it is extremely  high or has done damage to other organs in the body. For this reason, you should have your  blood pressure checked regularly by your physician. We will check your blood pressure  every time you see a provider at one of our offices.   Your blood pressure reading consists of two numbers. Ideally, blood pressure should be  below 120/80. The first ("top") number is called the systolic pressure. It measures the  pressure in your arteries as your heart beats. The second ("bottom") number is called the diastolic pressure. It measures the pressure in your arteries as the heart relaxes between beats.  The benefits of getting your blood pressure under control are enormous. A 10-point  reduction in systolic blood pressure can reduce your risk of stroke by 27% and heart failure by 28%  Your blood pressure goal is < 130/80  To check your pressure at home you will need to:  1. Sit up in a chair, with feet flat on the floor and back supported. Do not cross your ankles or legs. 2. Rest your left arm so that the cuff is about heart level. If the cuff goes on your upper arm,  then just relax the arm on the table, arm of the chair or your lap. If you have a wrist cuff, we  suggest relaxing your wrist against your chest (think of it as Pledging the Flag with the  wrong arm).  3. Place the cuff snugly around your arm, about 1 inch above the crook of your elbow. The  cords should be inside the groove of your elbow.  4. Sit quietly, with the cuff in place, for about 5 minutes. After that 5 minutes  press the power  button to start a reading. 5. Do not talk or move while the reading is taking place.  6. Record your readings on a sheet of paper. Although most cuffs have a memory, it is often  easier to see a pattern developing when the numbers are all in front of you.  7. You can repeat the reading after 1-3 minutes if it is recommended  Make sure your bladder is empty and you have not had caffeine or tobacco within the last 30 min  Always bring your blood pressure log with you to your appointments. If you have not brought your monitor in to be double checked for accuracy, please bring it to your next appointment.  You can find a list of quality blood pressure cuffs at validatebp.org

## 2022-12-01 NOTE — Telephone Encounter (Signed)
Requested Prescriptions  Pending Prescriptions Disp Refills   ibuprofen (ADVIL) 800 MG tablet [Pharmacy Med Name: IBUPROFEN 800 MG TABLET] 180 tablet 0    Sig: TAKE 1 TABLET (800 MG TOTAL) BY MOUTH EVERY 12 HOURS AS NEEDED     Analgesics:  NSAIDS Failed - 12/01/2022  1:20 AM      Failed - Manual Review: Labs are only required if the patient has taken medication for more than 8 weeks.      Passed - Cr in normal range and within 360 days    Creat  Date Value Ref Range Status  10/28/2016 0.64 0.50 - 1.10 mg/dL Final   Creatinine, Ser  Date Value Ref Range Status  08/14/2022 0.89 0.57 - 1.00 mg/dL Final   Creatinine, Urine  Date Value Ref Range Status  10/28/2016 158 20 - 320 mg/dL Final         Passed - HGB in normal range and within 360 days    Hemoglobin  Date Value Ref Range Status  02/20/2022 12.7 12.0 - 15.0 g/dL Final  06/01/2019 11.8 11.1 - 15.9 g/dL Final         Passed - PLT in normal range and within 360 days    Platelets  Date Value Ref Range Status  02/20/2022 389.0 150.0 - 400.0 K/uL Final  06/01/2019 413 150 - 450 x10E3/uL Final         Passed - HCT in normal range and within 360 days    HCT  Date Value Ref Range Status  02/20/2022 38.7 36.0 - 46.0 % Final   Hematocrit  Date Value Ref Range Status  06/01/2019 37.8 34.0 - 46.6 % Final         Passed - eGFR is 30 or above and within 360 days    GFR, Est African American  Date Value Ref Range Status  10/28/2016 >89 >=60 mL/min Final   GFR calc Af Amer  Date Value Ref Range Status  12/12/2020 127 >59 mL/min/1.73 Final    Comment:    **In accordance with recommendations from the NKF-ASN Task force,**   [facility] is in the process of updating its eGFR calculation to the   2021 CKD-EPI creatinine equation that estimates kidney function   without a race variable.    GFR, Est Non African American  Date Value Ref Range Status  10/28/2016 >89 >=60 mL/min Final   GFR calc non Af Amer  Date Value Ref Range  Status  12/12/2020 110 >59 mL/min/1.73 Final   GFR  Date Value Ref Range Status  02/20/2022 106.09 >60.00 mL/min Final    Comment:    Calculated using the CKD-EPI Creatinine Equation (2021)   eGFR  Date Value Ref Range Status  08/14/2022 81 >59 mL/min/1.73 Final         Passed - Patient is not pregnant      Passed - Valid encounter within last 12 months    Recent Outpatient Visits           2 months ago Type 2 diabetes mellitus with other neurologic complication, without long-term current use of insulin ([place])   [place], [place], MD   9 months ago Type 2 diabetes mellitus with other neurologic complication, without long-term current use of insulin ([place])   [place], Charlane Ferretti, MD   1 year ago Type 2 diabetes mellitus with other neurologic complication, without long-term current use of insulin ([place])  [place] Charlott Rakes, MD   1 year ago Acute cystitis without hematuria   [place], [place], [place]   1 year ago Type 2 diabetes mellitus with other neurologic complication, without long-term current use of insulin [facility])   [place], Enobong, MD       Future Appointments             In 3 months Charlott Rakes, MD [place]

## 2022-12-01 NOTE — Progress Notes (Signed)
12/01/2022 Amber Rose [birth year] [id number]   HPI:  Amber Rose is a 47 y.o. female patient of Dr Oval Linsey, with a Ward below who presents today for advanced hypertension clinic follow up.  Patient was referred to our clinic by Dr Margarita Rana in early 2022 for hypertension associated with multiple medication intolerances.  She had moved to [place] from the [place] and states that all of her current problems began about 6 years ago (in [place]) when she was put on a cholesterol medication and 2 blood pressure medications, all at the same time.  She developed severe leg pains that at times left her unable to walk.  She also developed facial swelling that comes and goes, she believes in relation to her BP readings.  She states that she continued on statin drugs for most of that time and nobody associated it with her leg pains, and once she finally stopped, the pains decreased by 60-70%.  Earlier this summer/fall she was on amlodipine and irbesartan for BP control.  She notes that she missed the irbesartan for a few days, as the pharmacy was trying to get a refill.  A few days later she also stopped the amlodipine because of a change in work schedule and forgot the medication.  States that her leg pains immediately lessened.  She was most recently seen by Laurann Montana NP after multiple My Chart messages with complaints of side effects and concerns.  Her pressure that day was fine and she was asked to continue with hydralazine 25 mg twice daily.  A few days later, after complaints of headache, hot flashes and increase in leg pain, she was given the choice to continue with/without doxazosin 2 mg daily.    I saw her in early November and the information above is my summation.  She believes that the irbesartan was not problematic, as she states she felt no different until a few days later when she stopped the amlodipine.  Played devils advocate and suggested that it could easily have been the irbesartan  that was problematic and it took several days to get out of her system.  She conceded that possibility, but is willing to try again.  Discussed the different mechanisms of how BP is controlled and why we don't expect the reactions she notes to clear so quickly after stopping a particular medication.   She has not taken the hydralazine or doxazosin for the past 3 days.  Still having some facial swelling, unsure at this time if it is related to any medication issues, fibromyalgia or perhaps hormonal changes.    At that visit in November I restarted her on the irbesartan 150 mg daily.  Two days later she sent a MyChart message noting that her leg pain returned immediately after starting the medication.  She believed it was due to medications "tightening and relaxing blood vessels", and asked for a medication that would not do that.  Replied with explanation of vasodilation as a means of lowering BP and encouraged her to continue, but was willing to try chlorthalidone.  After another week she stated that she had stopped the irbesartan and was instead focusing on diet, exercise and tobacco cessation.  On Nov 29 she was seen by Dr. Margarita Rana with a BP of 150/80, then saw podiatry last week with a pressure of 98/76.  I saw her last in mid-December, at which time she stated that her blood pressure and facial swelling both increased just with coming into the office.  She noted that she was looking at using lifestyle modification rather than medication to help.  She was given a prescription for spironolactone 12.5 mg daily.  Today she returns for follow up.  She never picked up the prescription for spironolactone after our last visit.  She instead made a strong effort to change her lifestyle.  She has lost some weight since December - about 11 pounds (she is on Ozempic) and has trended towards a plant based diet (see below).  Has not checked her blood pressure more than a few times, as she felt she needed a break from the  stress it was causing.  Still using ibuprofen for menstrual pain, but otherwise has stopped using it on a daily basis.     Past Medical History: HLD 10/23  LDL 51 - on Repatha  DM2 8/22  A1c 7.3 - on Ozempic 1 mg, not significant weight loss ~6-8 pounds  CAD Aortic atherosclerosis  fibromyalgia   Tobacco abuse States has tried to quit multiple times, down to 4 cigarettes/day     Blood Pressure Goal:  130/80  Current Medications:  none  Family Hx:   father has hypertension, no heart disease; mother has had multiple cancers; doesn't know much about siblings health, kids 23/28/21 - no health issues  Social Hx:    smokes 4 cigarettes/day- had one this morning; over the past month has cut back from 1 ppd, comes and goes; only occasional alcohol; no coffee or tea regularly, no sodas - occasional Mt Dew  Diet:   has stopped eating out, moved towards a plant based diet.  Still eating meat once or twice weekly, but has cut back on carbs like pastas, rice, potatoes.  More fruits and vegetables on a daily basis.  Occasional beans or eggs, but not eating much protein overall.    Exercise: stretches, yoga pilates You-Tube videos; exercises from therapy; walk, bouncing ball  Home BP readings:   machine many years old - arm cuff   AM 18 readings average 143/88 HR 74  (range 129-168/80-97)  PM 10 readings average 142/93 HR 75  (range 115-171/78-99)  Intolerances:  atorvastatin, rosuvastatin - joint pains  Lisinopril  - cough  Hydralazine - headache  Labs: 10/23:  Na 139, K 4.0, Glu 99, BUN 14, SCr 0.89, GFR 81   Wt Readings from Last 3 Encounters:  12/01/22 169 lb 6.4 oz (76.8 kg)  11/17/22 172 lb 9.6 oz (78.3 kg)  10/07/22 180 lb 9.6 oz (81.9 kg)   BP Readings from Last 3 Encounters:  12/01/22 128/84  11/17/22 (!) 142/102  10/07/22 (!) 154/102   Pulse Readings from Last 3 Encounters:  12/01/22 76  11/17/22 72  10/07/22 73    Current Outpatient Medications  Medication Sig Dispense  Refill   acyclovir (ZOVIRAX) 400 MG tablet TAKE 1 TABLET BY MOUTH TWICE A DAY 180 tablet 0   albuterol (PROAIR HFA) 108 (90 Base) MCG/ACT inhaler Inhale 2 puffs into the lungs every 4 (four) hours as needed for wheezing or shortness of breath. 3 Inhaler 0   ibuprofen (ADVIL) 800 MG tablet Take 1 tablet (800 mg total) by mouth every 12 (twelve) hours as needed. 60 tablet 1   methocarbamol (ROBAXIN) 750 MG tablet Take 1 tablet (750 mg total) by mouth every 8 (eight) hours as needed for muscle spasms. 90 tablet 3   omeprazole (PRILOSEC) 40 MG capsule Take 1 tab every morning. 90 capsule 1   Semaglutide, 2 MG/DOSE, 8 MG/3ML SOPN  Inject 2 mg as directed once a week. 3 mL 6   Blood Glucose Monitoring Suppl (ACCU-CHEK AVIVA PLUS) w/Device KIT USE AS DIRECTED DAILY. E11.9 (Patient not taking: Reported on 11/17/2022) 1 kit 0   Continuous Blood Gluc Receiver (FREESTYLE LIBRE 2 READER) DEVI Use to check blood sugar three times daily. E11.49 (Patient not taking: Reported on 11/17/2022) 1 each 0   Continuous Blood Gluc Sensor (FREESTYLE LIBRE 2 SENSOR) MISC Use to check blood sugar three times daily. Change sensors once every 14 days. E11.49 (Patient not taking: Reported on 11/17/2022) 2 each 3   Lancet Devices (ACCU-CHEK SOFTCLIX) lancets Use as instructed daily. (Patient not taking: Reported on 11/17/2022) 1 each 5   Multiple Vitamin (MULTI-VITAMIN DAILY) TABS Take 1 tablet by mouth daily as needed. (Patient not taking: Reported on 12/01/2022)     Nicotine 21-14-7 MG/24HR KIT Place 1 patch onto the skin as directed. (Patient not taking: Reported on 11/17/2022) 1 kit 0   nicotine polacrilex (NICORETTE) 2 MG gum Take 1 each (2 mg total) by mouth as needed for smoking cessation. (Patient not taking: Reported on 10/07/2022) 100 tablet 2   pregabalin (LYRICA) 100 MG capsule Take 1 capsule (100 mg total) by mouth 2 (two) times daily. (Patient not taking: Reported on 12/01/2022) 60 capsule 5   saccharomyces boulardii  (FLORASTOR) 250 MG capsule Take 1 capsule (250 mg total) by mouth 2 (two) times daily. (Patient not taking: Reported on 11/17/2022) 60 capsule 0   No current facility-administered medications for this visit.    Allergies  Allergen Reactions   Amlodipine     Leg pain, swelling around eyes    Atorvastatin Other (See Comments)     Joint pain   Crestor [Rosuvastatin]     Joint pain    Irbesartan     Leg pain, swelling around eyes   Metformin And Related Nausea And Vomiting   Metronidazole Nausea And Vomiting   Penicillins     childhood   Xanax [Alprazolam]     "Caused upper respiratory symptoms" per pt   Glyburide Other (See Comments)    "blood sugar dropped uncontrollably"   Linagliptin Diarrhea and Other (See Comments)    Stomach pain, sinus infection   Moxifloxacin Diarrhea    Past Medical History:  Diagnosis Date   Allergy    Anxiety    Aortic atherosclerosis (HCC)    Arthritis    Chronic UTI    Clostridium difficile infection    Depression    Diabetes mellitus without complication (HCC)    Diverticulosis    Fibromyalgia    GERD (gastroesophageal reflux disease)    Hiatal hernia    Hyperlipidemia    Hypertension    Internal hemorrhoids    Meningitis, viral    Pancreatitis    Pure hypercholesterolemia 12/23/2020   Tubular adenoma of colon     Blood pressure 128/84, pulse 76, height 5' 3.5" (1.613 m), weight 169 lb 6.4 oz (76.8 kg), last menstrual period 11/09/2021.      Hypertension Assessment: BP is controlled in office BP 128/84 mmHg;  only slightly above the goal (<130/80). Has switched diet to more plant based and less carbohydrates Not currently on any blood pressure lowering medications Denies SOB, palpitation, chest pain, headaches,or swelling Reiterated the importance of regular exercise and low salt diet   Plan:  Continue with current lifestyle modifications and weight loss Patient to occasionally check BP readings with heart rate and report to  [place] at the next  visit Patient to follow up with PharmD in 3 months for follow up  Labs ordered today:  CMET, [place] PharmD CPP [facility] [address]  [place], [place] [address] [phone]

## 2022-12-02 LAB — COMPREHENSIVE METABOLIC PANEL WITH GFR
ALT: 9 IU/L (ref 0–32)
AST: 9 IU/L (ref 0–40)
Albumin/Globulin Ratio: 1.5 (ref 1.2–2.2)
Albumin: 3.9 g/dL (ref 3.9–4.9)
Alkaline Phosphatase: 86 IU/L (ref 44–121)
BUN/Creatinine Ratio: 15 (ref 9–23)
BUN: 10 mg/dL (ref 6–24)
Bilirubin Total: 0.3 mg/dL (ref 0.0–1.2)
CO2: 20 mmol/L (ref 20–29)
Calcium: 8.8 mg/dL (ref 8.7–10.2)
Chloride: 102 mmol/L (ref 96–106)
Creatinine, Ser: 0.65 mg/dL (ref 0.57–1.00)
Globulin, Total: 2.6 g/dL (ref 1.5–4.5)
Glucose: 93 mg/dL (ref 70–99)
Potassium: 4.1 mmol/L (ref 3.5–5.2)
Sodium: 139 mmol/L (ref 134–144)
Total Protein: 6.5 g/dL (ref 6.0–8.5)
eGFR: 109 mL/min/1.73

## 2022-12-02 LAB — LIPID PANEL
Chol/HDL Ratio: 5 ratio — ABNORMAL HIGH (ref 0.0–4.4)
Cholesterol, Total: 213 mg/dL — ABNORMAL HIGH (ref 100–199)
HDL: 43 mg/dL (ref >39)
LDL Chol Calc (NIH): 151 mg/dL — ABNORMAL HIGH (ref 0–99)
Triglycerides: 105 mg/dL (ref 0–149)
VLDL Cholesterol Cal: 19 mg/dL (ref 5–40)

## 2022-12-03 ENCOUNTER — Ambulatory Visit (HOSPITAL_BASED_OUTPATIENT_CLINIC_OR_DEPARTMENT_OTHER): Payer: Medicaid Other | Admitting: Obstetrics & Gynecology

## 2022-12-04 ENCOUNTER — Telehealth (HOSPITAL_BASED_OUTPATIENT_CLINIC_OR_DEPARTMENT_OTHER): Payer: Self-pay

## 2022-12-04 NOTE — Telephone Encounter (Addendum)
Left message for patient to call back     ----- Message from Loel Dubonnet, NP sent at 12/03/2022 10:17 PM EST ----- Normal kidneys, liver, electrolytes.  Cholesterol is elevated. LDL (bad cholesterol) of 151 with goal of <70. It was previously 25 when taking Repatha. Hx of statin intolerance.   Please call patient to inquire if she is willing to resume Repatha or if there was a particular reason she stopped.

## 2022-12-14 ENCOUNTER — Encounter: Payer: Self-pay | Admitting: *Deleted

## 2022-12-23 ENCOUNTER — Encounter: Payer: Self-pay | Admitting: Internal Medicine

## 2022-12-23 ENCOUNTER — Ambulatory Visit (INDEPENDENT_AMBULATORY_CARE_PROVIDER_SITE_OTHER): Payer: 59 | Admitting: Internal Medicine

## 2022-12-23 VITALS — BP 134/80 | HR 76 | Ht 63.0 in | Wt 168.0 lb

## 2022-12-23 DIAGNOSIS — R143 Flatulence: Secondary | ICD-10-CM | POA: Diagnosis not present

## 2022-12-23 DIAGNOSIS — Z8619 Personal history of other infectious and parasitic diseases: Secondary | ICD-10-CM | POA: Diagnosis not present

## 2022-12-23 DIAGNOSIS — R141 Gas pain: Secondary | ICD-10-CM | POA: Diagnosis not present

## 2022-12-23 DIAGNOSIS — R140 Abdominal distension (gaseous): Secondary | ICD-10-CM

## 2022-12-23 DIAGNOSIS — R142 Eructation: Secondary | ICD-10-CM | POA: Diagnosis not present

## 2022-12-23 DIAGNOSIS — R198 Other specified symptoms and signs involving the digestive system and abdomen: Secondary | ICD-10-CM | POA: Diagnosis not present

## 2022-12-23 NOTE — Patient Instructions (Addendum)
_______________________________________________________  If your blood pressure at your visit was 140/90 or greater, please contact your primary care physician to follow up on this.  If you are age 47 or younger, your body mass index should be between 19-25. Your Body mass index is 29.76 kg/m. If this is out of the aformentioned range listed, please consider follow up with your Primary Care Provider.  ________________________________________________________  The [facility] providers would like to encourage you to use [facility] to communicate with providers for non-urgent requests or questions.  Due to long hold times on the telephone, sending your provider a message by [facility] may be a faster and more efficient way to get a response.  Please allow 48 business hours for a response.  Please remember that this is for non-urgent requests.  _______________________________________________________  Amber Rose are scheduled to follow up on 03-18-23 at 10:10am   Thank you for entrusting me with your care and choosing [facility].  Dr Hilarie Fredrickson

## 2022-12-23 NOTE — Progress Notes (Signed)
Subjective:    Patient ID: Amber Rose, female    DOB: 1975/12/06, 47 y.o.   MRN: [id number]  HPI Amber Rose is a 47 year old female with a history of probable gastroparesis, GERD, esophageal dysphagia responsive to prior dilation, C. difficile colitis, nonadvanced adenomatous colon polyp, hypertension, hyperlipidemia, diabetes who is seen for follow-up.  She is here alone today and was last seen in the office on 02/20/2022 by Ellouise Newer, PA-C.  Last year when she was seen before she wound up being diagnosed on multiple occasions with C. difficile.  She developed not diarrhea but definite change in bowel movement with soft mushy stools which were very large volume.  She did not feel sick or have fever but she had basically an "upset" lower stomach.  She was treated with vancomycin and then no recurrence with Dificid.  Her Dificid therapy was in September 2024.  She was then seen by ID in October and was told that she was likely colonized and may have never had "a real infection".  Lab over revealed PCR toxin positivity on 02/24/2022 though fecal leukocyte was negative at that time and fecal elastase was normal.  C. difficile negative on 03/19/2022, toxin positive on 05/13/2022, toxin negative on 06/11/2022, and toxin positive on 07/07/2022.  It should be noted that she did have improvement in her gas and abdominal bloating as well as mushy stools with each C. difficile treatment.  Since this time she has been placed just in the last month or so on semaglutide and has had the dose increased to 2 mg once weekly.  She has been off of statin which she states is led to decreased inflammation and about 20 pound weight loss.  From a GI perspective her stools are better occurring about twice a day and are formed.  She still has increased gas and flatulence and the gas has" ammonia" type odor to it.  She also has borborygmi.  Her diet has improved though at times with just first bite she will  have a discomfort just below the xiphoid which then goes away as she continues eating.  No true dysphagia.  No heartburn.  She is not using omeprazole on a regular basis.  She can occasionally feel a rolling or cramping in the right lower abdomen.  She is also been treated several times for BV including recently with clindamycin.   Review of Systems As per HPI, otherwise negative  Current Medications, Allergies, Past Medical History, Past Surgical History, Family History and Social History were reviewed in [employer] record.     Objective:   Physical Exam BP 134/80 (BP Location: Left Arm, Patient Position: Sitting, Cuff Size: Normal)   Pulse 76   Ht '5\' 3"'$  (1.6 m)   Wt 168 lb (76.2 kg)   LMP 11/30/2022   BMI 29.76 kg/m  Gen: awake, alert, NAD HEENT: anicteric  CV: RRR, no mrg Abd: soft, NT/ND, +BS throughout Ext: no c/c/e Neuro: nonfocal  CT ABDOMEN AND PELVIS WITH CONTRAST   TECHNIQUE: Multidetector CT imaging of the abdomen and pelvis was performed using the standard protocol following bolus administration of intravenous contrast.   RADIATION DOSE REDUCTION: This exam was performed according to the departmental dose-optimization program which includes automated exposure control, adjustment of the mA and/or kV according to patient size and/or use of iterative reconstruction technique.   CONTRAST:  33m OMNIPAQUE IOHEXOL 300 MG/ML  SOLN   COMPARISON:  CT abdomen pelvis dated 01/11/2020.   FINDINGS: Lower  chest: There is a thyroid nodule in the left lower lobe (18/4), present dating back to 02/28/2018. The visualized lung bases are otherwise clear.   No intra-abdominal free air.  Small free fluid within the pelvis.   Hepatobiliary: The liver is unremarkable. No intrahepatic biliary dilatation. Cholecystectomy.   Pancreas: Unremarkable. No pancreatic ductal dilatation or surrounding inflammatory changes.   Spleen: Normal in size without  focal abnormality.   Adrenals/Urinary Tract: The adrenal glands unremarkable. There is no hydronephrosis on either side. There is symmetric enhancement and excretion of contrast by both kidneys. The visualized ureters and the urinary bladder appear unremarkable.   Stomach/Bowel: There is no bowel obstruction or active inflammation. There is moderate stool throughout the colon. Appendectomy.   Vascular/Lymphatic: Mild aortoiliac atherosclerotic disease. The IVC is unremarkable. No portal venous gas. There is no adenopathy.   Reproductive: The uterus is anteverted and grossly unremarkable. There is a 2 cm left ovarian corpus luteum. The right ovary is unremarkable.   Other: None   Musculoskeletal: Degenerative changes of the spine. No acute osseous pathology.   IMPRESSION: 1. A 2 cm left ovarian corpus luteum. 2. No bowel obstruction. 3. Aortic Atherosclerosis (ICD10-I70.0).     Electronically Signed   By: Anner Crete M.D.   On: 03/02/2022 19:37     Latest Ref Rng & Units 02/20/2022    9:45 AM 06/01/2019    3:32 PM 03/23/2018   10:13 AM  CBC  WBC 4.0 - 10.5 K/uL 12.0  13.8  10.4   Hemoglobin 12.0 - 15.0 g/dL 12.7  11.8  12.0   Hematocrit 36.0 - 46.0 % 38.7  37.8  37.1   Platelets 150.0 - 400.0 K/uL 389.0  413  349    CMP     Component Value Date/Time   NA 139 12/01/2022 0955   K 4.1 12/01/2022 0955   CL 102 12/01/2022 0955   CO2 20 12/01/2022 0955   GLUCOSE 93 12/01/2022 0955   GLUCOSE 108 (H) 02/20/2022 0945   BUN 10 12/01/2022 0955   CREATININE 0.65 12/01/2022 0955   CREATININE 0.64 10/28/2016 0851   CALCIUM 8.8 12/01/2022 0955   PROT 6.5 12/01/2022 0955   ALBUMIN 3.9 12/01/2022 0955   AST 9 12/01/2022 0955   ALT 9 12/01/2022 0955   ALKPHOS 86 12/01/2022 0955   BILITOT 0.3 12/01/2022 0955   GFRNONAA 110 12/12/2020 1149   GFRNONAA >89 10/28/2016 0851   GFRAA 127 12/12/2020 1149   GFRAA >89 10/28/2016 0851       Assessment & Plan:  47 year old  female with a history of probable gastroparesis, GERD, esophageal dysphagia responsive to prior dilation, C. difficile colitis, nonadvanced adenomatous colon polyp, hypertension, hyperlipidemia, diabetes who is seen for follow-up.   History of C. Difficile --it is my opinion that she did have symptomatic C. difficile colitis given her mushy stools, abdominal pain and elevated white count.  She was treated with vancomycin and then later Dificid.  She does not have symptoms at this time consistent with recurrent infection and no testing is recommended.  She could have a colonization with C. difficile and we should avoid testing unless symptoms warrant.  2.  Gas and flatulence/borborygmi/crampy abdominal pain --reassuring CT last year.  She is up-to-date with colonoscopy.  Given her recent C. difficile I am hesitant to make any changes now to her medication therapy.  She is off of Florastor and can remain off probiotic for now.  Certainly this magnetized may be causing some change  in her overall bowel habits but her stools are formed and regular.  We could consider rifaximin therapy on follow-up -- Monitor for now without additional medical intervention -- If persistent at follow-up consider rifaximin  3.  History of adenomatous colon polyp --surveillance colonoscopy would be recommended in October 2027  4.  History of GERD and gastroparesis --managing this well for now.  Certainly Ozempic can delay gastric emptying but she is not having troublesome reflux symptoms to warrant PPI on a daily basis at this time.  77-monthfollow-up  30 minutes total spent today including patient facing time, coordination of care, reviewing medical history/procedures/pertinent radiology studies, and documentation of the encounter.

## 2022-12-25 ENCOUNTER — Telehealth: Payer: Self-pay | Admitting: Family Medicine

## 2022-12-25 NOTE — Telephone Encounter (Signed)
Contacted Amber Rose to schedule their annual wellness visit. Appointment made for 01/01/23.  Amber Rose AWV direct phone # [phone]

## 2023-01-25 ENCOUNTER — Ambulatory Visit: Payer: 59 | Attending: Family Medicine

## 2023-01-25 VITALS — Ht 63.0 in | Wt 165.0 lb

## 2023-01-25 DIAGNOSIS — Z0000 Encounter for general adult medical examination without abnormal findings: Secondary | ICD-10-CM | POA: Diagnosis not present

## 2023-01-25 NOTE — Progress Notes (Signed)
I connected with  [name] on 01/25/23 by a audio enabled telemedicine application and verified that I am speaking with the correct person using two identifiers.  Patient Location: Home  Provider Location: Office/Clinic  I discussed the limitations of evaluation and management by telemedicine. The patient expressed understanding and agreed to proceed.  Subjective:   Amber Rose is a 47 y.o. female who presents for Medicare Annual (Subsequent) preventive examination.  Review of Systems     Cardiac Risk Factors include: diabetes mellitus;hypertension;smoking/ tobacco exposure     Objective:    Today's Vitals   01/25/23 1111 01/25/23 1112  Weight: 165 lb (74.8 kg)   Height: 5\' 3"  (1.6 m)   PainSc:  6    Body mass index is 29.23 kg/m.     01/25/2023   11:21 AM 10/02/2021   10:09 AM 04/26/2020   12:34 PM 07/12/2017   11:31 AM 05/14/2017   10:11 AM 04/21/2017    3:10 PM 03/10/2017    3:46 PM  Advanced Directives  Does Patient Have a Medical Advance Directive? Yes No No No No No No  Type of Advance Directive Living will        Would patient like information on creating a medical advance directive?  No - Patient declined         Current Medications (verified) Outpatient Encounter Medications as of 01/25/2023  Medication Sig   acyclovir (ZOVIRAX) 400 MG tablet TAKE 1 TABLET BY MOUTH TWICE A DAY   albuterol (PROAIR HFA) 108 (90 Base) MCG/ACT inhaler Inhale 2 puffs into the lungs every 4 (four) hours as needed for wheezing or shortness of breath.   hydrOXYzine (ATARAX) 25 MG tablet Take 25 mg by mouth 2 (two) times daily.   ibuprofen (ADVIL) 800 MG tablet TAKE 1 TABLET (800 MG TOTAL) BY MOUTH EVERY 12 HOURS AS NEEDED   methocarbamol (ROBAXIN) 750 MG tablet Take 1 tablet (750 mg total) by mouth every 8 (eight) hours as needed for muscle spasms.   Multiple Vitamin (MULTI-VITAMIN DAILY) TABS Take 1 tablet by mouth daily as needed.   omeprazole (PRILOSEC) 40 MG capsule  Take 1 tab every morning.   pregabalin (LYRICA) 100 MG capsule Take 1 capsule (100 mg total) by mouth 2 (two) times daily.   Semaglutide, 2 MG/DOSE, 8 MG/3ML SOPN Inject 2 mg as directed once a week.   Blood Glucose Monitoring Suppl (ACCU-CHEK AVIVA PLUS) w/Device KIT USE AS DIRECTED DAILY. E11.9 (Patient not taking: Reported on 11/17/2022)   Continuous Blood Gluc Receiver (FREESTYLE LIBRE 2 READER) DEVI Use to check blood sugar three times daily. E11.49 (Patient not taking: Reported on 11/17/2022)   Continuous Blood Gluc Sensor (FREESTYLE LIBRE 2 SENSOR) MISC Use to check blood sugar three times daily. Change sensors once every 14 days. E11.49 (Patient not taking: Reported on 11/17/2022)   Lancet Devices (ACCU-CHEK SOFTCLIX) lancets Use as instructed daily. (Patient not taking: Reported on 11/17/2022)   saccharomyces boulardii (FLORASTOR) 250 MG capsule Take 1 capsule (250 mg total) by mouth 2 (two) times daily. (Patient not taking: Reported on 01/25/2023)   No facility-administered encounter medications on file as of 01/25/2023.    Allergies (verified) Amlodipine, Atorvastatin, Crestor [rosuvastatin], Irbesartan, Metformin and related, Metronidazole, Penicillins, Xanax [alprazolam], Glyburide, Linagliptin, and Moxifloxacin   History: Past Medical History:  Diagnosis Date   Allergy    Anxiety    Aortic atherosclerosis    Arthritis    Chronic UTI    Clostridium difficile infection  Depression    Diabetes mellitus without complication    Diverticulosis    Fibromyalgia    GERD (gastroesophageal reflux disease)    Hiatal hernia    Hyperlipidemia    Hypertension    Internal hemorrhoids    Meningitis, viral    Pancreatitis    Pure hypercholesterolemia 12/23/2020   Tubular adenoma of colon    Past Surgical History:  Procedure Laterality Date   APPENDECTOMY  1990   ceasarian  2002   CESAREAN SECTION     x1   CHOLECYSTECTOMY  2011   COLONOSCOPY     UPPER GASTROINTESTINAL ENDOSCOPY      URINARY SURGERY     urethra sling, removal, and then revision 2016 (4 surgeries)   WISDOM TOOTH EXTRACTION     Family History  Problem Relation Age of Onset   Cancer Mother        cervical, breast, lungm skin, bladder-ex smoker   Hypertension Mother    Diabetes Father    Heart attack Maternal Grandfather    Multiple sclerosis Paternal Grandmother    Allergic rhinitis Neg Hx    Angioedema Neg Hx    Asthma Neg Hx    Eczema Neg Hx    Urticaria Neg Hx    Colon cancer Neg Hx    Esophageal cancer Neg Hx    Liver disease Neg Hx    Pancreatic cancer Neg Hx    Prostate cancer Neg Hx    Rectal cancer Neg Hx    Stomach cancer Neg Hx    Colon polyps Neg Hx    Social History   Socioeconomic History   Marital status: Single    Spouse name: Not on file   Number of children: 3   Years of education: Not on file   Highest education level: Not on file  Occupational History   Not on file  Tobacco Use   Smoking status: Every Day    Packs/day: 0.10    Years: 20.00    Additional pack years: 0.00    Total pack years: 2.00    Types: Cigarettes   Smokeless tobacco: Never   Tobacco comments:    Patient is engaged in health coaching for smoking cessation as of 12/26/20  Vaping Use   Vaping Use: Never used  Substance and Sexual Activity   Alcohol use: Yes    Comment: occ   Drug use: Not Currently    Types: Marijuana   Sexual activity: Not Currently    Birth control/protection: Surgical    Comment: pt on her mentral cycle now began 11-14-17; BTL  Other Topics Concern   Not on file  Social History Narrative   Lives home with adult niece.  Not working.  Disability pending.  Pt is single.  Education GED.  3 children.    Social Determinants of Health   Financial Resource Strain: Low Risk  (01/25/2023)   Overall Financial Resource Strain (CARDIA)    Difficulty of Paying Living Expenses: Not hard at all  Food Insecurity: Food Insecurity Present (01/25/2023)   Hunger Vital Sign    Worried  About Running Out of Food in the Last Year: Sometimes true    Ran Out of Food in the Last Year: Sometimes true  Transportation Needs: No Transportation Needs (01/25/2023)   PRAPARE - Hydrologist (Medical): No    Lack of Transportation (Non-Medical): No  Physical Activity: Inactive (01/25/2023)   Exercise Vital Sign    Days of Exercise  per Week: 0 days    Minutes of Exercise per Session: 0 min  Stress: Stress Concern Present (01/25/2023)   [place]    Feeling of Stress : To some extent  Social Connections: Not on file    Tobacco Counseling Ready to quit: Yes Counseling given: Not Answered Tobacco comments: Patient is engaged in health coaching for smoking cessation as of 12/26/20   Clinical Intake:  Pre-visit preparation completed: Yes  Pain : 0-10 Pain Score: 6  Pain Type: Chronic pain Pain Location: Abdomen Pain Orientation: Left, Lower Pain Radiating Towards: down leg Pain Descriptors / Indicators: Aching Pain Onset: More than a month ago Pain Frequency: Constant     Nutritional Status: BMI 25 -29 Overweight Nutritional Risks: Nausea/ vomitting/ diarrhea (has gastroparesis) Diabetes: Yes  How often do you need to have someone help you when you read instructions, pamphlets, or other written materials from your doctor or pharmacy?: 1 - Never  Diabetic? Yes Nutrition Risk Assessment:  Has the patient had any N/V/D within the last 2 months?  Yes  Does the patient have any non-healing wounds?  No  Has the patient had any unintentional weight loss or weight gain?  Yes   Diabetes:  Is the patient diabetic?  Yes  If diabetic, was a CBG obtained today?  No  Did the patient bring in their glucometer from home?  No  How often do you monitor your CBG's? monthly.   Financial Strains and Diabetes Management:  Are you having any financial strains with the device, your supplies or  your medication? No .  Does the patient want to be seen by Chronic Care Management for management of their diabetes?  No  Would the patient like to be referred to a Nutritionist or for Diabetic Management?  No   Diabetic Exams:  Diabetic Eye Exam: Completed 03/25/2022 Diabetic Foot Exam: Overdue, Pt has been advised about the importance in completing this exam. Pt is scheduled for diabetic foot exam on next appointment.   Interpreter Needed?: No  Information entered by :: NAllen LPN   Activities of Daily Living    01/25/2023   11:26 AM  In your present state of health, do you have any difficulty performing the following activities:  Hearing? 0  Vision? 1  Comment has a little blurriness at times  Difficulty concentrating or making decisions? 1  Walking or climbing stairs? 1  Dressing or bathing? 0  Doing errands, shopping? 0  Preparing Food and eating ? N  Using the Toilet? N  In the past six months, have you accidently leaked urine? Y  Do you have problems with loss of bowel control? N  Managing your Medications? N  Managing your Finances? N  Housekeeping or managing your Housekeeping? Y    Patient Care Team: Charlott Rakes, MD as PCP - General (Family Medicine) Avelino Leeds  Indicate any recent Medical Services you may have received from other than [facility] providers in the past year (date may be approximate).     Assessment:   This is a routine wellness examination for [place].  Hearing/Vision screen Vision Screening - Comments:: Regular eye exams, [facility]  Dietary issues and exercise activities discussed: Current Exercise Habits: The patient does not participate in regular exercise at present   Goals Addressed   None    Depression Screen    01/25/2023   11:23 AM 02/24/2022    2:39 PM 11/28/2021    9:55  AM 04/09/2021    2:22 PM 01/20/2021   12:07 PM 09/13/2020   10:42 AM 07/02/2020    4:12 PM  PHQ 2/9 Scores  PHQ - 2 Score 2 4 3 3 4 3 3   PHQ- 9 Score 10 16  13 12 15 11 17     Fall Risk    01/25/2023   11:22 AM 11/17/2022   11:30 AM 09/23/2022    9:15 AM 08/17/2022    3:17 PM 02/24/2022    2:34 PM  Fall Risk   Falls in the past year? 1 1 0 1 0  Comment legs give out      Number falls in past yr: 1 0 0 1 0  Injury with Fall? 0 0 0 0 0  Risk for fall due to : Medication side effect;Impaired balance/gait;Impaired mobility  No Fall Risks History of fall(s)   Follow up Falls prevention discussed;Education provided;Falls evaluation completed   Falls evaluation completed     FALL RISK PREVENTION PERTAINING TO THE HOME:  Any stairs in or around the home? Yes  If so, are there any without handrails? No  Home free of loose throw rugs in walkways, pet beds, electrical cords, etc? Yes  Adequate lighting in your home to reduce risk of falls? Yes   ASSISTIVE DEVICES UTILIZED TO PREVENT FALLS:  Life alert? No  Use of a cane, walker or w/c? No  Grab bars in the bathroom? Yes  Shower chair or bench in shower? No  Elevated toilet seat or a handicapped toilet? No   TIMED UP AND GO:  Was the test performed? No .      Cognitive Function:        01/25/2023   11:29 AM  6CIT Screen  What Year? 0 points  What month? 0 points  What time? 0 points  Count back from 20 0 points  Months in reverse 0 points  Repeat phrase 8 points  Total Score 8 points    Immunizations Immunization History  Administered Date(s) Administered   Pneumococcal Polysaccharide-23 09/27/2017   Tdap 07/08/2014    TDAP status: Up to date  Flu Vaccine status: Declined, Education has been provided regarding the importance of this vaccine but patient still declined. Advised may receive this vaccine at local pharmacy or Health Dept. Aware to provide a copy of the vaccination record if obtained from local pharmacy or Health Dept. Verbalized acceptance and understanding.  Pneumococcal vaccine status: Declined,  Education has been provided regarding the importance of this  vaccine but patient still declined. Advised may receive this vaccine at local pharmacy or Health Dept. Aware to provide a copy of the vaccination record if obtained from local pharmacy or Health Dept. Verbalized acceptance and understanding.   Covid-19 vaccine status: Declined, Education has been provided regarding the importance of this vaccine but patient still declined. Advised may receive this vaccine at local pharmacy or Health Dept.or vaccine clinic. Aware to provide a copy of the vaccination record if obtained from local pharmacy or Health Dept. Verbalized acceptance and understanding.  Qualifies for Shingles Vaccine? No   Zostavax completed  n/a   Shingrix Completed?: n/a  Screening Tests Health Maintenance  Topic Date Due   COVID-19 Vaccine (1) Never done   Medicare Annual Wellness (AWV)  04/02/2022   FOOT EXAM  09/17/2022   HEMOGLOBIN A1C  03/24/2023   OPHTHALMOLOGY EXAM  03/26/2023   INFLUENZA VACCINE  05/27/2023   PAP SMEAR-Modifier  09/14/2023   Diabetic kidney evaluation -  Urine ACR  09/24/2023   Diabetic kidney evaluation - eGFR measurement  12/02/2023   DTaP/Tdap/Td (2 - Td or Tdap) 07/08/2024   COLONOSCOPY (Pts 45-53yrs Insurance coverage will need to be confirmed)  08/16/2026   Hepatitis C Screening  Completed   HIV Screening  Completed   HPV VACCINES  Aged Out    Health Maintenance  Health Maintenance Due  Topic Date Due   COVID-19 Vaccine (1) Never done   Medicare Annual Wellness (AWV)  04/02/2022   FOOT EXAM  09/17/2022    Colorectal cancer screening: Type of screening: Colonoscopy. Completed 08/17/2019. Repeat every 7 years  Mammogram status: patient to schedule  Bone Density status:  Lung Cancer Screening: (Low Dose CT Chest recommended if Age 38-80 years, 30 pack-year currently smoking OR have quit w/in 15years.) does not qualify.   Lung Cancer Screening Referral: no  Additional Screening:  Hepatitis C Screening: does qualify; Completed  11/28/2021  Vision Screening: Recommended annual ophthalmology exams for early detection of glaucoma and other disorders of the eye. Is the patient up to date with their annual eye exam?  Yes  Who is the provider or what is the name of the office in which the patient attends annual eye exams? [facility] If pt is not established with a provider, would they like to be referred to a provider to establish care? No .   Dental Screening: Recommended annual dental exams for proper oral hygiene  Community Resource Referral / Chronic Care Management: CRR required this visit?  No   CCM required this visit?  No      Plan:     I have personally reviewed and noted the following in the patient's chart:   Medical and social history Use of alcohol, tobacco or illicit drugs  Current medications and supplements including opioid prescriptions. Patient is not currently taking opioid prescriptions. Functional ability and status Nutritional status Physical activity Advanced directives List of other physicians Hospitalizations, surgeries, and ER visits in previous 12 months Vitals Screenings to include cognitive, depression, and falls Referrals and appointments  In addition, I have reviewed and discussed with patient certain preventive protocols, quality metrics, and best practice recommendations. A written personalized care plan for preventive services as well as general preventive health recommendations were provided to patient.     Kellie Simmering, LPN   [id number]   Nurse Notes: none  Due to this being a virtual visit, the after visit summary with patients personalized plan was offered to patient via mail or my-chart. Patient would like to access on my-chart

## 2023-01-25 NOTE — Patient Instructions (Signed)
Amber Rose , Thank you for taking time to come for your Medicare Wellness Visit. I appreciate your ongoing commitment to your health goals. Please review the following plan we discussed and let me know if I can assist you in the future.   These are the goals we discussed:  Goals      HEMOGLOBIN A1C < 7.0     Quit Smoking     Patient is engaged in health coaching for smoking cessation. Patient is currently working on the following steps:   Agreement/Action Steps:  Smoking 0-2 cigarettes per day Writing in journal daily Non-smoking support group Call [email or web address] for nicotine patches        This is a list of the screening recommended for you and due dates:  Health Maintenance  Topic Date Due   COVID-19 Vaccine (1) Never done   Complete foot exam   09/17/2022   Hemoglobin A1C  03/24/2023   Eye exam for diabetics  03/26/2023   Flu Shot  05/27/2023   Pap Smear  09/14/2023   Yearly kidney health urinalysis for diabetes  09/24/2023   Yearly kidney function blood test for diabetes  12/02/2023   Medicare Annual Wellness Visit  01/25/2024   DTaP/Tdap/Td vaccine (2 - Td or Tdap) 07/08/2024   Colon Cancer Screening  08/16/2026   Hepatitis C Screening: USPSTF Recommendation to screen - Ages 44-79 yo.  Completed   HIV Screening  Completed   HPV Vaccine  Aged Out    Advanced directives: Please bring a copy of your POA (Power of Shafter) and/or Living Will to your next appointment.   Conditions/risks identified: smoking  Next appointment: Follow up in one year for your annual wellness visit.   Preventive Care 40-64 Years, Female Preventive care refers to lifestyle choices and visits with your health care provider that can promote health and wellness. What does preventive care include? A yearly physical exam. This is also called an annual well check. Dental exams once or twice a year. Routine eye exams. Ask your health care provider how often you should have your eyes  checked. Personal lifestyle choices, including: Daily care of your teeth and gums. Regular physical activity. Eating a healthy diet. Avoiding tobacco and drug use. Limiting alcohol use. Practicing safe sex. Taking low-dose aspirin daily starting at age 71. Taking vitamin and mineral supplements as recommended by your health care provider. What happens during an annual well check? The services and screenings done by your health care provider during your annual well check will depend on your age, overall health, lifestyle risk factors, and family history of disease. Counseling  Your health care provider may ask you questions about your: Alcohol use. Tobacco use. Drug use. Emotional well-being. Home and relationship well-being. Sexual activity. Eating habits. Work and work Statistician. Method of birth control. Menstrual cycle. Pregnancy history. Screening  You may have the following tests or measurements: Height, weight, and BMI. Blood pressure. Lipid and cholesterol levels. These may be checked every 5 years, or more frequently if you are over 73 years old. Skin check. Lung cancer screening. You may have this screening every year starting at age 58 if you have a 30-pack-year history of smoking and currently smoke or have quit within the past 15 years. Fecal occult blood test (FOBT) of the stool. You may have this test every year starting at age 48. Flexible sigmoidoscopy or colonoscopy. You may have a sigmoidoscopy every 5 years or a colonoscopy every 10 years starting at age 8. Hepatitis  C blood test. Hepatitis B blood test. Sexually transmitted disease (STD) testing. Diabetes screening. This is done by checking your blood sugar (glucose) after you have not eaten for a while (fasting). You may have this done every 1-3 years. Mammogram. This may be done every 1-2 years. Talk to your health care provider about when you should start having regular mammograms. This may depend on  whether you have a family history of breast cancer. BRCA-related cancer screening. This may be done if you have a family history of breast, ovarian, tubal, or peritoneal cancers. Pelvic exam and Pap test. This may be done every 3 years starting at age 70. Starting at age 71, this may be done every 5 years if you have a Pap test in combination with an HPV test. Bone density scan. This is done to screen for osteoporosis. You may have this scan if you are at high risk for osteoporosis. Discuss your test results, treatment options, and if necessary, the need for more tests with your health care provider. Vaccines  Your health care provider may recommend certain vaccines, such as: Influenza vaccine. This is recommended every year. Tetanus, diphtheria, and acellular pertussis (Tdap, Td) vaccine. You may need a Td booster every 10 years. Zoster vaccine. You may need this after age 7. Pneumococcal 13-valent conjugate (PCV13) vaccine. You may need this if you have certain conditions and were not previously vaccinated. Pneumococcal polysaccharide (PPSV23) vaccine. You may need one or two doses if you smoke cigarettes or if you have certain conditions. Talk to your health care provider about which screenings and vaccines you need and how often you need them. This information is not intended to replace advice given to you by your health care provider. Make sure you discuss any questions you have with your health care provider. Document Released: 11/08/2015 Document Revised: 07/01/2016 Document Reviewed: 08/13/2015 Elsevier Interactive Patient Education  2017 [place] Prevention in the Home Falls can cause injuries. They can happen to people of all ages. There are many things you can do to make your home safe and to help prevent falls. What can I do on the outside of my home? Regularly fix the edges of walkways and driveways and fix any cracks. Remove anything that might make you trip as you  walk through a door, such as a raised step or threshold. Trim any bushes or trees on the path to your home. Use bright outdoor lighting. Clear any walking paths of anything that might make someone trip, such as rocks or tools. Regularly check to see if handrails are loose or broken. Make sure that both sides of any steps have handrails. Any raised decks and porches should have guardrails on the edges. Have any leaves, snow, or ice cleared regularly. Use sand or salt on walking paths during winter. Clean up any spills in your garage right away. This includes oil or grease spills. What can I do in the bathroom? Use night lights. Install grab bars by the toilet and in the tub and shower. Do not use towel bars as grab bars. Use non-skid mats or decals in the tub or shower. If you need to sit down in the shower, use a plastic, non-slip stool. Keep the floor dry. Clean up any water that spills on the floor as soon as it happens. Remove soap buildup in the tub or shower regularly. Attach bath mats securely with double-sided non-slip rug tape. Do not have throw rugs and other things on  the floor that can make you trip. What can I do in the bedroom? Use night lights. Make sure that you have a light by your bed that is easy to reach. Do not use any sheets or blankets that are too big for your bed. They should not hang down onto the floor. Have a firm chair that has side arms. You can use this for support while you get dressed. Do not have throw rugs and other things on the floor that can make you trip. What can I do in the kitchen? Clean up any spills right away. Avoid walking on wet floors. Keep items that you use a lot in easy-to-reach places. If you need to reach something above you, use a strong step stool that has a grab bar. Keep electrical cords out of the way. Do not use floor polish or wax that makes floors slippery. If you must use wax, use non-skid floor wax. Do not have throw rugs  and other things on the floor that can make you trip. What can I do with my stairs? Do not leave any items on the stairs. Make sure that there are handrails on both sides of the stairs and use them. Fix handrails that are broken or loose. Make sure that handrails are as long as the stairways. Check any carpeting to make sure that it is firmly attached to the stairs. Fix any carpet that is loose or worn. Avoid having throw rugs at the top or bottom of the stairs. If you do have throw rugs, attach them to the floor with carpet tape. Make sure that you have a light switch at the top of the stairs and the bottom of the stairs. If you do not have them, ask someone to add them for you. What else can I do to help prevent falls? Wear shoes that: Do not have high heels. Have rubber bottoms. Are comfortable and fit you well. Are closed at the toe. Do not wear sandals. If you use a stepladder: Make sure that it is fully opened. Do not climb a closed stepladder. Make sure that both sides of the stepladder are locked into place. Ask someone to hold it for you, if possible. Clearly mark and make sure that you can see: Any grab bars or handrails. First and last steps. Where the edge of each step is. Use tools that help you move around (mobility aids) if they are needed. These include: Canes. Walkers. Scooters. Crutches. Turn on the lights when you go into a dark area. Replace any light bulbs as soon as they burn out. Set up your furniture so you have a clear path. Avoid moving your furniture around. If any of your floors are uneven, fix them. If there are any pets around you, be aware of where they are. Review your medicines with your doctor. Some medicines can make you feel dizzy. This can increase your chance of falling. Ask your doctor what other things that you can do to help prevent falls. This information is not intended to replace advice given to you by your health care provider. Make sure  you discuss any questions you have with your health care provider. Document Released: 08/08/2009 Document Revised: 03/19/2016 Document Reviewed: 11/16/2014 Elsevier Interactive Patient Education  2017 [employer].

## 2023-01-28 ENCOUNTER — Encounter: Payer: Self-pay | Admitting: Family Medicine

## 2023-01-29 ENCOUNTER — Other Ambulatory Visit: Payer: Self-pay

## 2023-01-29 ENCOUNTER — Other Ambulatory Visit: Payer: Self-pay | Admitting: Family Medicine

## 2023-01-29 DIAGNOSIS — E1149 Type 2 diabetes mellitus with other diabetic neurological complication: Secondary | ICD-10-CM

## 2023-01-29 DIAGNOSIS — M79651 Pain in right thigh: Secondary | ICD-10-CM

## 2023-01-29 DIAGNOSIS — R202 Paresthesia of skin: Secondary | ICD-10-CM

## 2023-01-29 DIAGNOSIS — Z8619 Personal history of other infectious and parasitic diseases: Secondary | ICD-10-CM

## 2023-01-29 DIAGNOSIS — M25559 Pain in unspecified hip: Secondary | ICD-10-CM

## 2023-01-29 MED ORDER — PREGABALIN 100 MG PO CAPS: 100.0000 mg | ORAL_CAPSULE | Freq: Two times a day (BID) | ORAL | 5 refills | Status: DC

## 2023-01-29 MED ORDER — SEMAGLUTIDE (2 MG/DOSE) 8 MG/3ML ~~LOC~~ SOPN: 2.0000 mg | PEN_INJECTOR | SUBCUTANEOUS | 6 refills | Status: DC

## 2023-01-29 MED ORDER — IBUPROFEN 800 MG PO TABS: ORAL_TABLET | ORAL | 0 refills | Status: DC

## 2023-01-29 MED ORDER — OMEPRAZOLE 40 MG PO CPDR: DELAYED_RELEASE_CAPSULE | ORAL | 1 refills | Status: DC

## 2023-02-01 ENCOUNTER — Encounter (HOSPITAL_BASED_OUTPATIENT_CLINIC_OR_DEPARTMENT_OTHER): Payer: Self-pay | Admitting: Obstetrics & Gynecology

## 2023-02-01 ENCOUNTER — Ambulatory Visit (INDEPENDENT_AMBULATORY_CARE_PROVIDER_SITE_OTHER): Payer: 59 | Admitting: Obstetrics & Gynecology

## 2023-02-01 ENCOUNTER — Other Ambulatory Visit (HOSPITAL_COMMUNITY)
Admission: RE | Admit: 2023-02-01 | Discharge: 2023-02-01 | Disposition: A | Discharge status: Home / Self Care | Payer: 59 | Source: Ambulatory Visit | Attending: Obstetrics & Gynecology | Admitting: Obstetrics & Gynecology

## 2023-02-01 VITALS — BP 158/92 | HR 89 | Ht 63.0 in | Wt 157.0 lb

## 2023-02-01 DIAGNOSIS — N921 Excessive and frequent menstruation with irregular cycle: Secondary | ICD-10-CM

## 2023-02-01 DIAGNOSIS — N898 Other specified noninflammatory disorders of vagina: Secondary | ICD-10-CM

## 2023-02-01 DIAGNOSIS — F17200 Nicotine dependence, unspecified, uncomplicated: Secondary | ICD-10-CM

## 2023-02-01 DIAGNOSIS — I10 Essential (primary) hypertension: Secondary | ICD-10-CM | POA: Diagnosis not present

## 2023-02-01 DIAGNOSIS — R030 Elevated blood-pressure reading, without diagnosis of hypertension: Secondary | ICD-10-CM

## 2023-02-01 DIAGNOSIS — Z8742 Personal history of other diseases of the female genital tract: Secondary | ICD-10-CM

## 2023-02-01 NOTE — Progress Notes (Unsigned)
GYNECOLOGY  VISIT  CC:   bleeding concerns  HPI: 47 y.o. Amber Rose Single Black or African American female here for additional evaluation of bleeding.  Was seen initially by Sharen Counter for IUD placement.  They had discussed her bleeding when pt was with her mother and was seen by Misty Stanley.  Misty Stanley suggested return appt for pt and she came but wasn't ready for IUD placement and isn't sure this is the best option for her anyway.  Her for discussion.  Cycles used to be very regular and 28 days.  Now they are somewhere between 24 and 28 days.  Flow lasts 5 - 7 days.  Every day of her cycle is heavy.  Uses pad and tampons together and does pass clots.  This has been going on 1-2 years.  Ready to do something for treatment.  Also reports recurrent issues with BV and recurrent UTIs.  Reports currently having some discharge.    Lastly, she feels she may be perimenopausal with   Has had some issues with BV and UTI issues.  She is still having some discharge.    She is having some perimenopausal symptoms.  Having some hot flashes.  Chart reviewed.  Imaging hx reviewed.  She did have a CT scan done in 2023.  Reviewed.  Uterus appeared grossly normal.  She did have a left corpus luteal cyst.    Last Pap smear 09/13/2020 which was neg with neg HR HPV.  Past Medical History:  Diagnosis Date   Allergy    Anxiety    Aortic atherosclerosis    Arthritis    Chronic UTI    Clostridium difficile infection    Depression    Diabetes mellitus without complication    Diverticulosis    Fibromyalgia    GERD (gastroesophageal reflux disease)    Hiatal hernia    Hyperlipidemia    Hypertension    Internal hemorrhoids    Meningitis, viral    Pancreatitis    Pure hypercholesterolemia 12/23/2020   Tubular adenoma of colon     MEDS:   Current Outpatient Medications on File Prior to Visit  Medication Sig Dispense Refill   acyclovir (ZOVIRAX) 400 MG tablet TAKE 1 TABLET BY MOUTH TWICE A DAY 180 tablet 0    albuterol (PROAIR HFA) 108 (90 Base) MCG/ACT inhaler Inhale 2 puffs into the lungs every 4 (four) hours as needed for wheezing or shortness of breath. 3 Inhaler 0   hydrOXYzine (ATARAX) 25 MG tablet Take 25 mg by mouth 2 (two) times daily.     ibuprofen (ADVIL) 800 MG tablet TAKE 1 TABLET (800 MG TOTAL) BY MOUTH EVERY 12 HOURS AS NEEDED 180 tablet 0   methocarbamol (ROBAXIN) 750 MG tablet Take 1 tablet (750 mg total) by mouth every 8 (eight) hours as needed for muscle spasms. 90 tablet 3   Multiple Vitamin (MULTI-VITAMIN DAILY) TABS Take 1 tablet by mouth daily as needed.     omeprazole (PRILOSEC) 40 MG capsule Take 1 tab every morning. 90 capsule 1   pregabalin (LYRICA) 100 MG capsule Take 1 capsule (100 mg total) by mouth 2 (two) times daily. 60 capsule 5   Semaglutide, 2 MG/DOSE, 8 MG/3ML SOPN Inject 2 mg as directed once a week. 3 mL 6   Blood Glucose Monitoring Suppl (ACCU-CHEK AVIVA PLUS) w/Device KIT USE AS DIRECTED DAILY. E11.9 (Patient not taking: Reported on 11/17/2022) 1 kit 0   Continuous Blood Gluc Receiver (FREESTYLE LIBRE 2 READER) DEVI Use to check blood  sugar three times daily. E11.49 (Patient not taking: Reported on 11/17/2022) 1 each 0   Continuous Blood Gluc Sensor (FREESTYLE LIBRE 2 SENSOR) MISC Use to check blood sugar three times daily. Change sensors once every 14 days. E11.49 (Patient not taking: Reported on 11/17/2022) 2 each 3   Lancet Devices (ACCU-CHEK SOFTCLIX) lancets Use as instructed daily. (Patient not taking: Reported on 11/17/2022) 1 each 5   saccharomyces boulardii (FLORASTOR) 250 MG capsule Take 1 capsule (250 mg total) by mouth 2 (two) times daily. (Patient not taking: Reported on 01/25/2023) 60 capsule 0   No current facility-administered medications on file prior to visit.    ALLERGIES: Amlodipine, Atorvastatin, Crestor [rosuvastatin], Irbesartan, Metformin and related, Metronidazole, Penicillins, Xanax [alprazolam], Glyburide, Linagliptin, and  Moxifloxacin  SH:  single, smoker  Review of Systems  Constitutional: Negative.   Genitourinary:        Heavy bleeding    PHYSICAL EXAMINATION:    BP (!) 168/102 (BP Location: Right Arm, Patient Position: Sitting, Cuff Size: Large)   Pulse 89   Ht 5\' 3"  (1.6 m) Comment: Reported  Wt 157 lb (71.2 kg)   BMI 27.81 kg/m     General appearance: alert, cooperative and appears stated age Breasts: normal appearance, no masses or tenderness Abdomen: soft, non-tender; bowel sounds normal; no masses,  no organomegaly Lymph:  no inguinal LAD noted  Pelvic: External genitalia:  no lesions              Urethra:  normal appearing urethra with no masses, tenderness or lesions              Bartholins and Skenes: normal                 Vagina: normal appearing vagina with normal color and discharge, no lesions              Cervix: no lesions              Bimanual Exam:  Uterus:  normal size, contour, position, consistency, mobility, non-tender              Adnexa: no mass, fullness, tenderness              Rectovaginal: Yes.  .  Confirms.              Anus:  normal sphincter tone, no lesions  Chaperone, Ina Homes, CMA, was present for exam.  Assessment/Plan: 1. Menorrhagia with irregular cycle - feel evaluation is needed.  Will start with ultrasound.  May need endometrial biopsy as well.   - US PELVIC COMPLETE WITH TRANSVAGINAL; Future - CBC - Follicle stimulating hormone  2. Vaginal discharge - will check for BV today - Cervicovaginal ancillary only( [place])  3. Smoking  4. Primary hypertension - discussed with pt today.  She feels she does not need treatment due to hx of side effects with medication.  Advised management of blood pressures prior to any surgical procedure.  5. History of vaginitis

## 2023-02-02 ENCOUNTER — Other Ambulatory Visit: Payer: Self-pay | Admitting: Family Medicine

## 2023-02-02 DIAGNOSIS — Z8619 Personal history of other infectious and parasitic diseases: Secondary | ICD-10-CM

## 2023-02-02 DIAGNOSIS — E1149 Type 2 diabetes mellitus with other diabetic neurological complication: Secondary | ICD-10-CM

## 2023-02-02 LAB — CBC
Hematocrit: 37.1 % (ref 34.0–46.6)
Hemoglobin: 11.8 g/dL (ref 11.1–15.9)
MCH: 27.7 pg (ref 26.6–33.0)
MCHC: 31.8 g/dL (ref 31.5–35.7)
MCV: 87 fL (ref 79–97)
Platelets: 382 10*3/uL (ref 150–450)
RBC: 4.26 x10E6/uL (ref 3.77–5.28)
RDW: 14.9 % (ref 11.7–15.4)
WBC: 16.7 10*3/uL — ABNORMAL HIGH (ref 3.4–10.8)

## 2023-02-02 LAB — CERVICOVAGINAL ANCILLARY ONLY
Bacterial Vaginitis (gardnerella): NEGATIVE
Candida Glabrata: NEGATIVE
Candida Vaginitis: NEGATIVE
Comment: NEGATIVE
Comment: NEGATIVE
Comment: NEGATIVE

## 2023-02-02 LAB — FOLLICLE STIMULATING HORMONE: FSH: 4.5 m[IU]/mL

## 2023-02-02 MED ORDER — OMEPRAZOLE 40 MG PO CPDR: DELAYED_RELEASE_CAPSULE | ORAL | 1 refills | Status: DC

## 2023-02-02 MED ORDER — ACYCLOVIR 400 MG PO TABS: 400.0000 mg | ORAL_TABLET | Freq: Two times a day (BID) | ORAL | 1 refills | Status: DC

## 2023-02-02 MED ORDER — SEMAGLUTIDE (2 MG/DOSE) 8 MG/3ML ~~LOC~~ SOPN
2.0000 mg | PEN_INJECTOR | SUBCUTANEOUS | 6 refills | Status: DC
Start: 2023-02-02 — End: 2023-02-04

## 2023-02-04 ENCOUNTER — Encounter: Payer: Self-pay | Admitting: Family Medicine

## 2023-02-04 ENCOUNTER — Ambulatory Visit: Payer: 59 | Attending: Family Medicine | Admitting: Pharmacist

## 2023-02-04 ENCOUNTER — Ambulatory Visit: Payer: 59 | Attending: Family Medicine | Admitting: Family Medicine

## 2023-02-04 VITALS — BP 170/84 | HR 79 | Temp 99.0°F | Ht 63.0 in | Wt 163.8 lb

## 2023-02-04 DIAGNOSIS — E1149 Type 2 diabetes mellitus with other diabetic neurological complication: Secondary | ICD-10-CM

## 2023-02-04 DIAGNOSIS — E1159 Type 2 diabetes mellitus with other circulatory complications: Secondary | ICD-10-CM

## 2023-02-04 DIAGNOSIS — Z720 Tobacco use: Secondary | ICD-10-CM | POA: Diagnosis not present

## 2023-02-04 DIAGNOSIS — I152 Hypertension secondary to endocrine disorders: Secondary | ICD-10-CM | POA: Diagnosis not present

## 2023-02-04 DIAGNOSIS — D72829 Elevated white blood cell count, unspecified: Secondary | ICD-10-CM

## 2023-02-04 LAB — POCT GLYCOSYLATED HEMOGLOBIN (HGB A1C): HbA1c, POC (controlled diabetic range): 6 % (ref 0.0–7.0)

## 2023-02-04 MED ORDER — SEMAGLUTIDE (1 MG/DOSE) 4 MG/3ML ~~LOC~~ SOPN
1.0000 mg | PEN_INJECTOR | SUBCUTANEOUS | 6 refills | Status: DC
Start: 2023-02-04 — End: 2023-02-27

## 2023-02-04 NOTE — Progress Notes (Signed)
Patient was educated on the use of the Libre 2 CGM blood glucose meter. Reviewed necessary supplies and operation of the meter. Also reviewed goal blood glucose levels. Patient was able to demonstrate use. All questions and concerns were addressed.  Time spent counseling: 15 minutes  Butch Penny, PharmD, [place], CPP Clinical Pharmacist [facility] [phone]

## 2023-02-04 NOTE — Progress Notes (Signed)
Recent lab work elevated WBC

## 2023-02-04 NOTE — Patient Instructions (Signed)
Leukocytosis Leukocytosis means that a person has more white blood cells than normal. White blood cells are made in the bone marrow. Bone marrow is the spongy tissue inside bones. The main job of white blood cells is to fight infection. Having too many white blood cells is a common condition. It can develop as a result of many types of medical problems. What are the causes? Leukocytosis may be caused by various conditions. In some cases, the bone marrow is normal but is still making too many white blood cells. This could be the result of: Infection. Injury. Physical stress. Emotional stress. Surgery. Allergic reactions. Certain medicines. Other causes may include: A genetic or inherited disease. Chronic inflammatory conditions. Tumors that start in other areas of the body, but not in the blood or bone marrow. Pregnancy and labor. In other cases, a person may have a bone marrow disorder that is causing the body to make too many white blood cells. Bone marrow disorders include: Leukemia. This is a type of blood cancer. Myeloproliferative disorders. These disorders cause blood cells to grow abnormally. What are the signs or symptoms? Often, this condition causes no symptoms. Some people may have symptoms due to the medical condition that is causing their leukocytosis. These symptoms may include: Bleeding. Bruising. Fever. Night sweats. Weakness. Weight loss. Other symptoms may include: An enlarged spleen. Swollen lymph nodes. Repeated infections. How is this diagnosed? This condition is diagnosed with blood tests. It is often found when blood is tested as part of a routine physical exam. You may have other tests to help determine why you have too many white blood cells. These tests may include: A complete blood count (CBC). This test measures all the types of blood cells in your body. Chest X-rays, urine tests, or other tests to look for signs of infection. Bone marrow aspiration.  For this test, a needle is put into your bone. Cells from the bone marrow are removed through the needle and examined under a microscope. Other tests on the blood or bone marrow sample. CT scan, bone scan, or other imaging tests. How is this treated? Usually, treatment is not needed for leukocytosis. However, if an infection, cancer, bone marrow disorder, or other serious problem is causing your leukocytosis, it will need to be treated. Treatment may include: Regular monitoring of your white blood cell count to look for changes. Antibiotic medicine if you have a bacterial infection. Bone marrow transplant. This treatment replaces your diseased bone marrow with healthy cells that will grow new bone marrow. Chemotherapy or biological therapies such as the use of antibodies. These treatments may be used to kill cancer cells or to decrease the number of white blood cells. Follow these instructions at home: Medicines Take over-the-counter and prescription medicines only as told by your health care provider. If you were prescribed an antibiotic medicine, take it as told by your health care provider. Do not stop taking the antibiotic even if you start to feel better. Eating and drinking  Eat foods that are low in saturated fats and high in fiber. Eat plenty of fruits and vegetables. Drink enough fluid to keep your urine pale yellow. Limit your intake of caffeine and alcohol. General instructions Maintain a healthy weight. Ask your health care provider what weight is best for you. Do 30 minutes of exercise at least 5 times each week. Check with your health care provider before you start a new exercise routine. Follow any safety precautions as told by your health care provider. This   may be needed if your condition causes an increased risk for infection or bleeding. Do not use any products that contain nicotine or tobacco. These products include cigarettes, chewing tobacco, and vaping devices, such as  e-cigarettes. If you need help quitting, ask your health care provider. Keep all follow-up visits. This is important. Contact a health care provider if: You feel weak or more tired than usual. You develop chills, a cough, or nasal congestion. You have a fever. You lose weight without trying. You have night sweats. You bruise easily. You have new or worsening symptoms. Get help right away if: You bleed more than normal or your bleeding is difficult to stop. You have chest pain or trouble breathing. You have nausea or vomiting that does not stop. You feel dizzy or light-headed, or you lose consciousness. These symptoms may be an emergency. Get help right away. Call 911. Do not wait to see if the symptoms will go away. Do not drive yourself to the hospital. Summary Leukocytosis means that a person has more white blood cells than normal. This condition often causes no symptoms. This condition may be caused by various conditions. Keep all follow-up visits. This is important. This information is not intended to replace advice given to you by your health care provider. Make sure you discuss any questions you have with your health care provider. Document Revised: 05/18/2021 Document Reviewed: 05/18/2021 Elsevier Patient Education  2023 [employer]  

## 2023-02-04 NOTE — Progress Notes (Signed)
Subjective:  Patient ID: Amber Rose, female    DOB: 16-May-1976  Age: 48 y.o. MRN: [id number]  CC: Diabetes   HPI Amber Rose is a 47 y.o. year old female with a history of  type 2 diabetes mellitus (A1c 6.0), hypertension, depression and anxiety, hydradenitis, tobacco abuse, fibromyalgia seen for a follow up visit.     Interval History: She had sent a message stating she had been experiencing low sugars and did have a sugar of 63 around 2am, 4am.  Last meal of the day is around 6 PM. Her average sugar is 108. Last dose of Ozempic was 10 days ago CG data reviewed Time in target - 83%  Blood pressure is elevated.  She is currently under the care of the cardiology advanced hypertension clinic. She states BP at home is usually around 139/85 . She smoked a Cigarettes just before this visit. She is not on any antihypertensives as she states she has had multiple adverse effects with different medications. She had labs done by GYN which revealed leukocytosis of 16.7. Past Medical History:  Diagnosis Date   Allergy    Anxiety    Aortic atherosclerosis    Arthritis    Chronic UTI    Clostridium difficile infection    Depression    Diabetes mellitus without complication    Diverticulosis    Fibromyalgia    GERD (gastroesophageal reflux disease)    Hiatal hernia    Hyperlipidemia    Hypertension    Internal hemorrhoids    Meningitis, viral    Pancreatitis    Pure hypercholesterolemia 12/23/2020   Tubular adenoma of colon     Past Surgical History:  Procedure Laterality Date   APPENDECTOMY  1990   CESAREAN SECTION  2002   CHOLECYSTECTOMY  2011   COLONOSCOPY     UPPER GASTROINTESTINAL ENDOSCOPY     URINARY SURGERY     urethra sling, removal, and then revision 2016 (4 surgeries)   WISDOM TOOTH EXTRACTION      Family History  Problem Relation Age of Onset   Cancer Mother        cervical, breast, lungm skin, bladder-ex smoker   Hypertension Mother     Diabetes Father    Heart attack Maternal Grandfather    Multiple sclerosis Paternal Grandmother    Allergic rhinitis Neg Hx    Angioedema Neg Hx    Asthma Neg Hx    Eczema Neg Hx    Urticaria Neg Hx    Colon cancer Neg Hx    Esophageal cancer Neg Hx    Liver disease Neg Hx    Pancreatic cancer Neg Hx    Prostate cancer Neg Hx    Rectal cancer Neg Hx    Stomach cancer Neg Hx    Colon polyps Neg Hx     Social History   Socioeconomic History   Marital status: Single    Spouse name: Not on file   Number of children: 3   Years of education: Not on file   Highest education level: Not on file  Occupational History   Not on file  Tobacco Use   Smoking status: Every Day    Packs/day: 0.10    Years: 20.00    Additional pack years: 0.00    Total pack years: 2.00    Types: Cigarettes   Smokeless tobacco: Never   Tobacco comments:    Patient is engaged in health coaching for smoking cessation as of 12/26/20  Vaping  Use   Vaping Use: Never used  Substance and Sexual Activity   Alcohol use: Yes    Comment: occ   Drug use: Not Currently    Types: Marijuana   Sexual activity: Not Currently    Birth control/protection: Surgical    Comment: pt on her mentral cycle now began 11-14-17; BTL  Other Topics Concern   Not on file  Social History Narrative   Lives home with adult niece.  Not working.  Disability pending.  Pt is single.  Education GED.  3 children.    Social Determinants of Health   Financial Resource Strain: Low Risk  (01/25/2023)   Overall Financial Resource Strain (CARDIA)    Difficulty of Paying Living Expenses: Not hard at all  Food Insecurity: Food Insecurity Present (01/25/2023)   Hunger Vital Sign    Worried About Running Out of Food in the Last Year: Sometimes true    Ran Out of Food in the Last Year: Sometimes true  Transportation Needs: No Transportation Needs (01/25/2023)   PRAPARE - Administrator, Civil Service (Medical): No    Lack of  Transportation (Non-Medical): No  Physical Activity: Inactive (01/25/2023)   Exercise Vital Sign    Days of Exercise per Week: 0 days    Minutes of Exercise per Session: 0 min  Stress: Stress Concern Present (01/25/2023)   [employer] of Occupational Health - Occupational Stress Questionnaire    Feeling of Stress : To some extent  Social Connections: Not on file    Allergies  Allergen Reactions   Amlodipine     Leg pain, swelling around eyes    Atorvastatin Other (See Comments)     Joint pain   Crestor [Rosuvastatin]     Joint pain    Irbesartan     Leg pain, swelling around eyes   Metformin And Related Nausea And Vomiting   Metronidazole Nausea And Vomiting   Penicillins     childhood   Xanax [Alprazolam]     "Caused upper respiratory symptoms" per pt   Glyburide Other (See Comments)    "blood sugar dropped uncontrollably"   Linagliptin Diarrhea and Other (See Comments)    Stomach pain, sinus infection   Moxifloxacin Diarrhea    Outpatient Medications Prior to Visit  Medication Sig Dispense Refill   acyclovir (ZOVIRAX) 400 MG tablet Take 1 tablet (400 mg total) by mouth 2 (two) times daily. 180 tablet 1   albuterol (PROAIR HFA) 108 (90 Base) MCG/ACT inhaler Inhale 2 puffs into the lungs every 4 (four) hours as needed for wheezing or shortness of breath. 3 Inhaler 0   Blood Glucose Monitoring Suppl (ACCU-CHEK AVIVA PLUS) w/Device KIT USE AS DIRECTED DAILY. E11.9 1 kit 0   Continuous Blood Gluc Receiver (FREESTYLE LIBRE 2 READER) DEVI Use to check blood sugar three times daily. E11.49 1 each 0   Continuous Blood Gluc Sensor (FREESTYLE LIBRE 2 SENSOR) MISC Use to check blood sugar three times daily. Change sensors once every 14 days. E11.49 2 each 3   hydrOXYzine (ATARAX) 25 MG tablet Take 25 mg by mouth 2 (two) times daily.     ibuprofen (ADVIL) 800 MG tablet TAKE 1 TABLET (800 MG TOTAL) BY MOUTH EVERY 12 HOURS AS NEEDED 180 tablet 0   Lancet Devices (ACCU-CHEK SOFTCLIX)  lancets Use as instructed daily. 1 each 5   methocarbamol (ROBAXIN) 750 MG tablet Take 1 tablet (750 mg total) by mouth every 8 (eight) hours as needed for muscle  spasms. 90 tablet 3   Multiple Vitamin (MULTI-VITAMIN DAILY) TABS Take 1 tablet by mouth daily as needed.     omeprazole (PRILOSEC) 40 MG capsule Take 1 tab every morning. 90 capsule 1   pregabalin (LYRICA) 100 MG capsule Take 1 capsule (100 mg total) by mouth 2 (two) times daily. 60 capsule 5   saccharomyces boulardii (FLORASTOR) 250 MG capsule Take 1 capsule (250 mg total) by mouth 2 (two) times daily. 60 capsule 0   Semaglutide, 2 MG/DOSE, 8 MG/3ML SOPN Inject 2 mg as directed once a week. 3 mL 6   No facility-administered medications prior to visit.     ROS Review of Systems  Constitutional:  Negative for activity change and appetite change.  HENT:  Negative for sinus pressure and sore throat.   Respiratory:  Negative for chest tightness, shortness of breath and wheezing.   Cardiovascular:  Negative for chest pain and palpitations.  Gastrointestinal:  Negative for abdominal distention, abdominal pain and constipation.  Genitourinary: Negative.   Musculoskeletal: Negative.   Psychiatric/Behavioral:  Negative for behavioral problems and dysphoric mood.     Objective:  BP (!) 170/84   Pulse 79   Temp 99 F (37.2 C) (Oral)   Ht 5\' 3"  (1.6 m)   Wt 163 lb 12.8 oz (74.3 kg)   SpO2 99%   BMI 29.02 kg/m      02/04/2023   11:25 AM 02/04/2023   10:30 AM 02/01/2023   11:40 AM  BP/Weight  Systolic BP 170 168 158  Diastolic BP 84 85 92  Wt. (Lbs)  163.8   BMI  29.02 kg/m2       Physical Exam Constitutional:      Appearance: She is well-developed.  Cardiovascular:     Rate and Rhythm: Normal rate.     Heart sounds: Normal heart sounds. No murmur heard. Pulmonary:     Effort: Pulmonary effort is normal.     Breath sounds: Normal breath sounds. No wheezing or rales.  Chest:     Chest wall: No tenderness.   Abdominal:     General: Bowel sounds are normal. There is no distension.     Palpations: Abdomen is soft. There is no mass.     Tenderness: There is abdominal tenderness (slight LLQ TTP).  Musculoskeletal:        General: Normal range of motion.     Right lower leg: No edema.     Left lower leg: No edema.  Neurological:     Mental Status: She is alert and oriented to person, place, and time.  Psychiatric:        Mood and Affect: Mood normal.        Latest Ref Rng & Units 12/01/2022    9:55 AM 08/14/2022    2:31 PM 02/20/2022    9:45 AM  CMP  Glucose 70 - 99 mg/dL 93  99  076   BUN 6 - 24 mg/dL 10  14  10    Creatinine 0.57 - 1.00 mg/dL 8.08  8.11  0.31   Sodium 134 - 144 mmol/L 139  139  135   Potassium 3.5 - 5.2 mmol/L 4.1  4.0  4.1   Chloride 96 - 106 mmol/L 102  105  102   CO2 20 - 29 mmol/L 20  23  25    Calcium 8.7 - 10.2 mg/dL 8.8  9.0  9.3   Total Protein 6.0 - 8.5 g/dL 6.5  6.6  7.4   Total  Bilirubin 0.0 - 1.2 mg/dL 0.3  <1.6  0.4   Alkaline Phos 44 - 121 IU/L 86  80  96   AST 0 - 40 IU/L 9  12  13    ALT 0 - 32 IU/L 9  10  13      Lipid Panel     Component Value Date/Time   CHOL 213 (H) 12/01/2022 0955   TRIG 105 12/01/2022 0955   HDL 43 12/01/2022 0955   CHOLHDL 5.0 (H) 12/01/2022 0955   CHOLHDL 4.4 10/28/2016 0851   LDLCALC 151 (H) 12/01/2022 0955   LDLDIRECT 51 08/14/2022 1431    CBC    Component Value Date/Time   WBC 16.7 (H) 02/01/2023 1130   WBC 12.0 (H) 02/20/2022 0945   RBC 4.26 02/01/2023 1130   RBC 4.36 02/20/2022 0945   HGB 11.8 02/01/2023 1130   HCT 37.1 02/01/2023 1130   PLT 382 02/01/2023 1130   MCV 87 02/01/2023 1130   MCH 27.7 02/01/2023 1130   MCH 28.0 10/12/2016 0517   MCHC 31.8 02/01/2023 1130   MCHC 32.9 02/20/2022 0945   RDW 14.9 02/01/2023 1130   LYMPHSABS 2.0 02/20/2022 0945   LYMPHSABS 2.8 06/01/2019 1532   MONOABS 0.7 02/20/2022 0945   EOSABS 0.1 02/20/2022 0945   EOSABS 0.1 06/01/2019 1532   BASOSABS 0.1 02/20/2022  0945   BASOSABS 0.1 06/01/2019 1532    Lab Results  Component Value Date   HGBA1C 6.0 02/04/2023    Assessment & Plan:  1. Type 2 diabetes mellitus with other neurologic complication, without long-term current use of insulin Controlled with A1c of 6.0 Advised to eat a late evening snack around 8 or 9 PM Due to the fact that she is having hypoglycemia we have discussed possible management strategies and after shared decision making she opts to decrease dose of Ozempic Counseled on Diabetic diet, my plate method, 109 minutes of moderate intensity exercise/week Blood sugar logs with fasting goals of 80-120 mg/dl, random of less than 604 and in the event of sugars less than 60 mg/dl or greater than 540 mg/dl encouraged to notify the clinic. Advised on the need for annual eye exams, annual foot exams, Pneumonia vaccine. - POCT glycosylated hemoglobin (Hb A1C) - Semaglutide, 1 MG/DOSE, 4 MG/3ML SOPN; Inject 1 mg as directed once a week.  Dispense: 3 mL; Refill: 6  2. Leukocytosis, unspecified type She denies recent steroid use WBC is 16.7 today and this has trended up over the last couple of years.  No differentials ordered with this set of labs Will refer to hematology for further evaluation so she is better reassured - Ambulatory referral to Hematology / Oncology  3. Tobacco abuse She has been unable to tolerate pharmacotherapy Currently not ready to quit and would like to quit on her own when she is ready  4. Hypertension associated with diabetes Uncontrolled Currently not on any pharmacotherapy She has had side effects with different medications in the past Currently managed by the cardiology advanced hypertension clinic and has been advised to schedule an appointment with them She just smoked prior to this visit which could also contribute to this Counseled on blood pressure goal of less than 130/80, low-sodium, DASH diet, medication compliance, 150 minutes of moderate intensity  exercise per week. Discussed medication compliance, adverse effects.   Meds ordered this encounter  Medications   Semaglutide, 1 MG/DOSE, 4 MG/3ML SOPN    Sig: Inject 1 mg as directed once a week.    Dispense:  3 mL    Refill:  6    Discontinue 2 mg    Follow-up: Return in about 6 months (around 08/06/2023).       Hoy RegisterEnobong Stanley Lyness, MD, FAAFP. [facility] [place], [place] [phone]   02/04/2023, 3:49 PM

## 2023-02-05 ENCOUNTER — Other Ambulatory Visit (HOSPITAL_BASED_OUTPATIENT_CLINIC_OR_DEPARTMENT_OTHER): Payer: 59

## 2023-02-05 ENCOUNTER — Ambulatory Visit: Payer: Self-pay | Admitting: Pharmacist

## 2023-02-05 DIAGNOSIS — D72829 Elevated white blood cell count, unspecified: Secondary | ICD-10-CM | POA: Diagnosis not present

## 2023-02-05 LAB — CBC WITH DIFFERENTIAL/PLATELET
Basophils Absolute: 0 10*3/uL (ref 0.0–0.2)
Basos: 0 %
EOS (ABSOLUTE): 0 10*3/uL (ref 0.0–0.4)
Eos: 0 %
Hematocrit: 33.9 % — ABNORMAL LOW (ref 34.0–46.6)
Hemoglobin: 10.8 g/dL — ABNORMAL LOW (ref 11.1–15.9)
Immature Grans (Abs): 0 10*3/uL (ref 0.0–0.1)
Immature Granulocytes: 0 %
Lymphocytes Absolute: 2 10*3/uL (ref 0.7–3.1)
Lymphs: 18 %
MCH: 28.2 pg (ref 26.6–33.0)
MCHC: 31.9 g/dL (ref 31.5–35.7)
MCV: 89 fL (ref 79–97)
Monocytes Absolute: 0.7 10*3/uL (ref 0.1–0.9)
Monocytes: 6 %
Neutrophils Absolute: 8.4 10*3/uL — ABNORMAL HIGH (ref 1.4–7.0)
Neutrophils: 76 %
Platelets: 312 10*3/uL (ref 150–450)
RBC: 3.83 x10E6/uL (ref 3.77–5.28)
RDW: 14.8 % (ref 11.7–15.4)
WBC: 11.2 10*3/uL — ABNORMAL HIGH (ref 3.4–10.8)

## 2023-02-06 ENCOUNTER — Ambulatory Visit (HOSPITAL_BASED_OUTPATIENT_CLINIC_OR_DEPARTMENT_OTHER)
Admission: RE | Admit: 2023-02-06 | Discharge: 2023-02-06 | Disposition: A | Discharge status: Home / Self Care | Payer: 59 | Source: Ambulatory Visit | Attending: Obstetrics & Gynecology | Admitting: Obstetrics & Gynecology

## 2023-02-06 DIAGNOSIS — N921 Excessive and frequent menstruation with irregular cycle: Secondary | ICD-10-CM | POA: Diagnosis present

## 2023-02-08 ENCOUNTER — Encounter: Payer: Self-pay | Admitting: Family Medicine

## 2023-02-09 ENCOUNTER — Other Ambulatory Visit (HOSPITAL_BASED_OUTPATIENT_CLINIC_OR_DEPARTMENT_OTHER): Payer: Self-pay | Admitting: Obstetrics & Gynecology

## 2023-02-09 ENCOUNTER — Encounter (HOSPITAL_BASED_OUTPATIENT_CLINIC_OR_DEPARTMENT_OTHER): Payer: Self-pay | Admitting: Obstetrics & Gynecology

## 2023-02-09 DIAGNOSIS — N9489 Other specified conditions associated with female genital organs and menstrual cycle: Secondary | ICD-10-CM

## 2023-02-09 DIAGNOSIS — N921 Excessive and frequent menstruation with irregular cycle: Secondary | ICD-10-CM

## 2023-02-09 DIAGNOSIS — D500 Iron deficiency anemia secondary to blood loss (chronic): Secondary | ICD-10-CM

## 2023-02-09 MED ORDER — NORETHINDRONE 0.35 MG PO TABS
1.0000 | ORAL_TABLET | Freq: Every day | ORAL | 5 refills | Status: DC
Start: 2023-02-09 — End: 2023-03-15

## 2023-02-10 ENCOUNTER — Encounter: Payer: Self-pay | Admitting: Family Medicine

## 2023-02-11 ENCOUNTER — Encounter (HOSPITAL_BASED_OUTPATIENT_CLINIC_OR_DEPARTMENT_OTHER): Payer: Self-pay

## 2023-02-11 ENCOUNTER — Other Ambulatory Visit: Payer: Self-pay | Admitting: Family Medicine

## 2023-02-11 DIAGNOSIS — R072 Precordial pain: Secondary | ICD-10-CM

## 2023-02-11 DIAGNOSIS — E1149 Type 2 diabetes mellitus with other diabetic neurological complication: Secondary | ICD-10-CM

## 2023-02-11 MED ORDER — METOPROLOL TARTRATE 100 MG PO TABS
100.0000 mg | ORAL_TABLET | Freq: Once | ORAL | 0 refills | Status: DC
Start: 2023-02-11 — End: 2023-08-04

## 2023-02-11 NOTE — Telephone Encounter (Signed)
Would you like to order CCS?

## 2023-02-15 ENCOUNTER — Other Ambulatory Visit: Payer: Self-pay | Admitting: Family Medicine

## 2023-02-15 ENCOUNTER — Encounter: Payer: Self-pay | Admitting: Family Medicine

## 2023-02-15 DIAGNOSIS — E1149 Type 2 diabetes mellitus with other diabetic neurological complication: Secondary | ICD-10-CM

## 2023-02-16 ENCOUNTER — Encounter: Payer: Self-pay | Admitting: Family Medicine

## 2023-02-16 ENCOUNTER — Other Ambulatory Visit: Payer: Self-pay | Admitting: Family Medicine

## 2023-02-16 ENCOUNTER — Other Ambulatory Visit: Payer: Self-pay | Admitting: Pharmacist

## 2023-02-16 MED ORDER — FREESTYLE TEST VI STRP: ORAL_STRIP | 3 refills | Status: DC

## 2023-02-16 MED ORDER — FREESTYLE LANCETS MISC: 3 refills | Status: AC

## 2023-02-16 NOTE — Telephone Encounter (Signed)
Reader should last 3 years- Rx 11/06/22 Requested Prescriptions  Pending Prescriptions Disp Refills   Continuous Glucose Receiver (FREESTYLE LIBRE 2 READER) DEVI [Pharmacy Med Name: FREESTYLE LIBRE 2 READER] 1 each 0    Sig: USE TO CHECK BLOOD SUGAR THREE TIMES DAILY. E11.49     Endocrinology: Diabetes - Testing Supplies Passed - 02/15/2023  3:48 PM      Passed - Valid encounter within last 12 months    Recent Outpatient Visits           1 week ago Type 2 diabetes mellitus with other neurologic complication, without long-term current use of insulin   [place] [facility] [place], [place] L, RPH-CPP   1 week ago Type 2 diabetes mellitus with other neurologic complication, without long-term current use of insulin   [facility] [place], Odette Horns, MD   4 months ago Type 2 diabetes mellitus with other neurologic complication, without long-term current use of insulin (HCC)   [place] [facility] [place], [place], MD   11 months ago Type 2 diabetes mellitus with other neurologic complication, without long-term current use of insulin (HCC)   [place] [facility] [place], Odette Horns, MD   1 year ago Type 2 diabetes mellitus with other neurologic complication, without long-term current use of insulin [facility])    [facility] Hoy Register, MD       Future Appointments             In 6 months Hoy Register, MD [facility]

## 2023-02-17 ENCOUNTER — Other Ambulatory Visit: Payer: Self-pay

## 2023-02-17 ENCOUNTER — Encounter (HOSPITAL_BASED_OUTPATIENT_CLINIC_OR_DEPARTMENT_OTHER): Payer: Self-pay

## 2023-02-17 DIAGNOSIS — R072 Precordial pain: Secondary | ICD-10-CM | POA: Diagnosis not present

## 2023-02-17 LAB — BASIC METABOLIC PANEL
BUN/Creatinine Ratio: 15 (ref 9–23)
BUN: 15 mg/dL (ref 6–24)
CO2: 18 mmol/L — ABNORMAL LOW (ref 20–29)
Calcium: 9.4 mg/dL (ref 8.7–10.2)
Chloride: 105 mmol/L (ref 96–106)
Creatinine, Ser: 0.97 mg/dL (ref 0.57–1.00)
Glucose: 97 mg/dL (ref 70–99)
Potassium: 5.1 mmol/L (ref 3.5–5.2)
Sodium: 140 mmol/L (ref 134–144)
eGFR: 73 mL/min/{1.73_m2} (ref >59)

## 2023-02-19 ENCOUNTER — Other Ambulatory Visit: Payer: Self-pay | Admitting: Obstetrics & Gynecology

## 2023-02-19 ENCOUNTER — Telehealth (HOSPITAL_COMMUNITY): Payer: Self-pay | Admitting: *Deleted

## 2023-02-19 NOTE — Telephone Encounter (Signed)
Amber Rose sent a MyChart message 02/11/2023 requesting cardiac CTA.  She was concerned regarding potential hysterectomy and wanting to know cardiac status prior to proceeding with surgery.  Previously discussed cardiac CTA during her previous visit with me given her known hyperlipidemia, aortic atherosclerosis, cardiovascular risk factors including tobacco use.  Additionally, her cholesterol numbers lowered when taking Repatha but she discontinued.  She has prior intolerance to multiple statin.  Cardiac CTA will help to further clarify how aggressive we need to be in primary versus secondary prevention of CAD.  Already a CTA was ordered and is upcoming next week.  Alver Sorrow, NP

## 2023-02-19 NOTE — Telephone Encounter (Signed)
Reaching out to patient to offer assistance regarding upcoming cardiac imaging study; pt verbalizes understanding of appt date/time, parking situation and where to check in, pre-test NPO status and medications ordered, and verified current allergies; name and call back number provided for further questions should they arise  Julio Zappia RN Navigator Cardiac Imaging [facility] [phone] office [phone] cell  Patient to take 100mg metoprolol tartrate two hours prior to her cardiac CT scan.  She is aware to arrive at 10:30am. 

## 2023-02-20 LAB — CA 125: Cancer Antigen (CA) 125: 27.5 U/mL (ref 0.0–38.1)

## 2023-02-22 ENCOUNTER — Other Ambulatory Visit (HOSPITAL_BASED_OUTPATIENT_CLINIC_OR_DEPARTMENT_OTHER): Payer: 59

## 2023-02-22 ENCOUNTER — Encounter (HOSPITAL_COMMUNITY): Payer: Self-pay

## 2023-02-22 ENCOUNTER — Telehealth (HOSPITAL_BASED_OUTPATIENT_CLINIC_OR_DEPARTMENT_OTHER): Payer: Self-pay

## 2023-02-22 ENCOUNTER — Ambulatory Visit (HOSPITAL_COMMUNITY)
Admission: RE | Admit: 2023-02-22 | Discharge: 2023-02-22 | Disposition: A | Discharge status: Home / Self Care | Payer: 59 | Source: Ambulatory Visit | Attending: Family | Admitting: Family

## 2023-02-22 ENCOUNTER — Other Ambulatory Visit: Payer: Self-pay

## 2023-02-22 DIAGNOSIS — R072 Precordial pain: Secondary | ICD-10-CM | POA: Diagnosis not present

## 2023-02-22 MED ORDER — NITROGLYCERIN 0.4 MG SL SUBL
SUBLINGUAL_TABLET | SUBLINGUAL | Status: AC
  Filled 2023-02-22: qty 2

## 2023-02-22 MED ORDER — NITROGLYCERIN 0.4 MG SL SUBL
0.8000 mg | SUBLINGUAL_TABLET | Freq: Once | SUBLINGUAL | Status: AC
  Administered 2023-02-22: 0.8 mg via SUBLINGUAL

## 2023-02-22 MED ORDER — IOHEXOL 350 MG/ML SOLN
95.0000 mL | Freq: Once | INTRAVENOUS | Status: AC | PRN
  Administered 2023-02-22: 95 mL via INTRAVENOUS

## 2023-02-22 NOTE — Telephone Encounter (Signed)
Zetia? With repeat labs?

## 2023-02-22 NOTE — Telephone Encounter (Addendum)
Seen by patient [name] on 02/22/2023  4:42 PM; Follow up mychart message sent to patient.    ----- Message from Alver Sorrow, NP sent at 02/22/2023  4:41 PM EDT ----- Coronary calcium score of 124 with nonobstructive coronary disease. Not enough to cause symptoms, but enough to focus on prevention.   Start Aspirin 81mg  daily. Start Rosuvastatin 20mg  daily. FLP/LFT in 6-8 weeks.

## 2023-02-23 ENCOUNTER — Telehealth: Payer: Self-pay | Admitting: Internal Medicine

## 2023-02-23 ENCOUNTER — Other Ambulatory Visit: Payer: Self-pay | Admitting: Family Medicine

## 2023-02-23 DIAGNOSIS — Z8619 Personal history of other infectious and parasitic diseases: Secondary | ICD-10-CM

## 2023-02-23 NOTE — Telephone Encounter (Signed)
Pt following up

## 2023-02-23 NOTE — Telephone Encounter (Signed)
patient called to reschedule her NP appointment , transferred call over to the NP scheduler.

## 2023-02-24 NOTE — Telephone Encounter (Signed)
Sent to [facility] order pharmacy  Requested Prescriptions  Pending Prescriptions Disp Refills   acyclovir (ZOVIRAX) 400 MG tablet [Pharmacy Med Name: ACYCLOVIR 400 MG TABLET] 180 tablet 1    Sig: TAKE 1 TABLET BY MOUTH TWICE A DAY     Antimicrobials:  Antiviral Agents - Anti-Herpetic Passed - 02/23/2023  1:30 AM      Passed - Valid encounter within last 12 months    Recent Outpatient Visits           2 weeks ago Type 2 diabetes mellitus with other neurologic complication, without long-term current use of insulin (HCC)   [place] [facility] [place], [place] L, RPH-CPP   2 weeks ago Type 2 diabetes mellitus with other neurologic complication, without long-term current use of insulin (HCC)   [place] [facility] [place], [place], MD   5 months ago Type 2 diabetes mellitus with other neurologic complication, without long-term current use of insulin (HCC)   [place] [facility] [place], [place], MD   1 year ago Type 2 diabetes mellitus with other neurologic complication, without long-term current use of insulin (HCC)   [place] [facility] [place] [facility], Odette Horns, MD   1 year ago Type 2 diabetes mellitus with other neurologic complication, without long-term current use of insulin [facility])   [place] [facility] Hoy Register, MD       Future Appointments             In 5 months Hoy Register, MD [facility]

## 2023-02-26 ENCOUNTER — Ambulatory Visit: Payer: Self-pay | Admitting: *Deleted

## 2023-02-26 ENCOUNTER — Encounter: Payer: Self-pay | Admitting: Family Medicine

## 2023-02-26 NOTE — Telephone Encounter (Addendum)
Message sent to [facility] RN regarding patient sx and issues with blood sugars. Cassandra RN reports she will f/u with patient .   Chief Complaint: low blood sugars since yesterday  Symptoms: headache, fatigue agitation. Frequent urination. Reports BS up and down since yesterday now 141 but within 45 minutes drops to 65. Checking BS from [place] sensor last changed 02/22/23. Patient has not checked BS using glucometer.  Frequency: 2 days Pertinent Negatives: Patient denies dizziness no chest pain no difficulty breathing reported.  Disposition: [] ED /[] Urgent Care (no appt availability in office) / [] Appointment(In office/virtual)/ []  [place] [facility]/ [] Home Care/ [] Refused Recommended Disposition /[] [place] [facility]/ [x]  Follow-up with PCP Additional Notes:  Instructed patient to recheck BS with glucometer and call back. Recommended if needed to call 911 to assist with checking BS.     Reason for Disposition  [1] Low blood sugar symptoms with no other adult present AND [2] hasn't tried Care Advice  Answer Assessment - Initial Assessment Questions 1. SYMPTOMS: "What symptoms are you concerned about?"     Headache , fatigue  2. ONSET:  "When did the symptoms start?"     X 2 days  3. BLOOD GLUCOSE: "What is your blood glucose level?"      97 4. USUAL RANGE: "What is your blood glucose level usually?" (e.g., usual fasting morning value, usual evening value)     Not sure  5. TYPE 1 or 2:  "Do you know what type of diabetes you have?"  (e.g., Type 1, Type 2, Gestational; doesn't know)      Type 2  6. INSULIN: "Do you take insulin?" "What type of insulin(s) do you use? What is the mode of delivery? (syringe, pen; injection or pump) "When did you last give yourself an insulin dose?" (i.e., time or hours/minutes ago) "How much did you give?" (i.e., how many units)     Ozemipic 1 mg Wednesdays  7. DIABETES PILLS: "Do you take any pills for your diabetes?" If Yes, ask: "What is the name  of the medicine(s) that you take for high blood sugar?"     na 8. OTHER SYMPTOMS: "Do you have any symptoms?" (e.g., fever, frequent urination, difficulty breathing, vomiting)     Frequent urination , agitation,  9. LOW BLOOD GLUCOSE TREATMENT: "What have you done so far to treat the low blood glucose level?"     Eating candy strawberries, peanut butter crackers. 10. FOOD: "When did you last eat or drink?"       Just now  11. ALONE: "Are you alone right now or is someone with you?"        Alone going to work . Now blood sugar up to 141. 2:41 pm 12. PREGNANCY: "Is there any chance you are pregnant?" "When was your last menstrual period?"       na  Protocols used: Diabetes - Low Blood Sugar-A-AH

## 2023-02-27 ENCOUNTER — Other Ambulatory Visit: Payer: Self-pay | Admitting: Family Medicine

## 2023-02-27 MED ORDER — OZEMPIC (0.25 OR 0.5 MG/DOSE) 2 MG/1.5ML ~~LOC~~ SOPN: 0.2500 mg | PEN_INJECTOR | SUBCUTANEOUS | 6 refills | Status: DC

## 2023-03-01 ENCOUNTER — Other Ambulatory Visit: Payer: Self-pay | Admitting: *Deleted

## 2023-03-01 DIAGNOSIS — E559 Vitamin D deficiency, unspecified: Secondary | ICD-10-CM

## 2023-03-02 ENCOUNTER — Inpatient Hospital Stay: Payer: 59

## 2023-03-02 ENCOUNTER — Encounter: Payer: 59 | Admitting: Internal Medicine

## 2023-03-06 ENCOUNTER — Inpatient Hospital Stay: Payer: 59 | Attending: Internal Medicine | Admitting: Hematology and Oncology

## 2023-03-06 VITALS — BP 170/91 | HR 79 | Temp 97.3°F | Resp 18 | Wt 164.1 lb

## 2023-03-06 DIAGNOSIS — N83202 Unspecified ovarian cyst, left side: Secondary | ICD-10-CM | POA: Diagnosis not present

## 2023-03-06 DIAGNOSIS — N920 Excessive and frequent menstruation with regular cycle: Secondary | ICD-10-CM | POA: Diagnosis not present

## 2023-03-06 DIAGNOSIS — I10 Essential (primary) hypertension: Secondary | ICD-10-CM | POA: Insufficient documentation

## 2023-03-06 DIAGNOSIS — F17210 Nicotine dependence, cigarettes, uncomplicated: Secondary | ICD-10-CM | POA: Insufficient documentation

## 2023-03-06 DIAGNOSIS — E119 Type 2 diabetes mellitus without complications: Secondary | ICD-10-CM | POA: Diagnosis not present

## 2023-03-06 DIAGNOSIS — D72829 Elevated white blood cell count, unspecified: Secondary | ICD-10-CM | POA: Insufficient documentation

## 2023-03-06 DIAGNOSIS — E785 Hyperlipidemia, unspecified: Secondary | ICD-10-CM | POA: Diagnosis not present

## 2023-03-06 DIAGNOSIS — D72825 Bandemia: Secondary | ICD-10-CM

## 2023-03-06 DIAGNOSIS — Z87440 Personal history of urinary (tract) infections: Secondary | ICD-10-CM | POA: Insufficient documentation

## 2023-03-06 NOTE — Progress Notes (Signed)
[facility] CONSULT NOTE  Patient Care Team: Hoy Register, MD as PCP - General (Family Medicine) [name]  CHIEF COMPLAINTS/PURPOSE OF CONSULTATION:  Leukocytosis  ASSESSMENT & PLAN:   This is a very pleasant 47 year old premenopausal female patient with ongoing medical issues including but not limited to fatigue, abdominal symptoms, diabetes, hypertension and fibromyalgia related pain with persistent leukocytosis referred to hematology for the same.  She is a good historian.  She tells me that she had a lot of issues which were thought to be related to the statin use and since she discontinued her aches and pains have significantly improved.  She is working on smoking cessation, currently smokes about 0.1 pack/day.  She mostly complains of some abdominal tenderness which is being worked up at this time.  She also had lost some weight part of it could be related to Ozempic and part of it could be related to the illness.  Physical examination today, she appears healthy, no lymphadenopathy or hepatosplenomegaly. We have discussed about common causes of leukocytosis including steroid use, cigarette smoking, stress, chronic pain, autoimmune diseases and occasionally blood disorders such as myeloproliferative disorders.  I have reviewed her labs over the past several years, her white blood cell count is almost always at the upper limit of normal.  Her most recent white blood cell count has actually improved from past month.  And its predominantly neutrophilia.  Given fluctuant leukocytosis and predominantly being neutrophilia, I suspect this to be reactive leukocytosis likely from ongoing illness versus pain issues versus stress and smoking.  We have recommended some blood work today.  She will return to clinic in 3 months or sooner as needed.  If she has persistent and progressive leukocytosis and any other hematological concerns, then we will pursue hematologic workup for  myeloproliferative disorders.  At this time my suspicion is low for this.  Thank you for consulting [place] in the care of this patient.  Please do not hesitate to contact us with any additional questions or concerns.   HISTORY OF PRESENTING ILLNESS:  Amber Rose 47 y.o. female is here because of leukocytosis.  This is a very pleasant 47 yr old female patient with PMH of DM, HTN, dyslipidemia, fibromyalgia who mentioned that she at baseline says she is not the healthiest. She currently is most bothered by abdominal pain, she says she had an extensive work up and they couldn't find the etiology of abdominal pain. She had a lot of side effects with statins, she quit it about a yr ago. She felt much better, myalgias resolved, vision changes resolved. She had a question C diff infection, treated. She denies any fevers, reports occasional hot flashes, has lost about 40 lbs of weight likely with uncontrolled DB, ozempic and may be muscle wasting. No change in breathing. No change in bowel habits or urinary habits. At baseline, she has some digestion issues, constipation alternating with diarrhea. No know autoimmune disease. She has noticed some polyuria, dark urine and wonders if she may have an other UTI. Last UTI was about 4 months ago.  Rest of the pertinent 10 point ROS reviewed and negative.  MEDICAL HISTORY:  Past Medical History:  Diagnosis Date   Allergy    Anxiety    Aortic atherosclerosis (HCC)    Arthritis    Chronic UTI    Clostridium difficile infection    Depression    Diabetes mellitus without complication (HCC)    Diverticulosis    Fibromyalgia  GERD (gastroesophageal reflux disease)    Hiatal hernia    Hyperlipidemia    Hypertension    Internal hemorrhoids    Meningitis, viral    Pancreatitis    Pure hypercholesterolemia 12/23/2020   Tubular adenoma of colon     SURGICAL HISTORY: Past Surgical History:  Procedure Laterality Date   APPENDECTOMY  1990    CESAREAN SECTION  2002   CHOLECYSTECTOMY  2011   COLONOSCOPY     UPPER GASTROINTESTINAL ENDOSCOPY     URINARY SURGERY     urethra sling, removal, and then revision 2016 (4 surgeries)   WISDOM TOOTH EXTRACTION      SOCIAL HISTORY: Social History   Socioeconomic History   Marital status: Single    Spouse name: Not on file   Number of children: 3   Years of education: Not on file   Highest education level: Not on file  Occupational History   Not on file  Tobacco Use   Smoking status: Every Day    Packs/day: 0.10    Years: 20.00    Additional pack years: 0.00    Total pack years: 2.00    Types: Cigarettes   Smokeless tobacco: Never   Tobacco comments:    Patient is engaged in health coaching for smoking cessation as of 12/26/20  Vaping Use   Vaping Use: Never used  Substance and Sexual Activity   Alcohol use: Yes    Comment: occ   Drug use: Not Currently    Types: Marijuana   Sexual activity: Not Currently    Birth control/protection: Surgical    Comment: pt on her mentral cycle now began 11-14-17; BTL  Other Topics Concern   Not on file  Social History Narrative   Lives home with adult niece.  Not working.  Disability pending.  Pt is single.  Education GED.  3 children.    Social Determinants of Health   Financial Resource Strain: Low Risk  (01/25/2023)   Overall Financial Resource Strain (CARDIA)    Difficulty of Paying Living Expenses: Not hard at all  Food Insecurity: Food Insecurity Present (01/25/2023)   Hunger Vital Sign    Worried About Running Out of Food in the Last Year: Sometimes true    Ran Out of Food in the Last Year: Sometimes true  Transportation Needs: No Transportation Needs (01/25/2023)   PRAPARE - Administrator, Civil Service (Medical): No    Lack of Transportation (Non-Medical): No  Physical Activity: Inactive (01/25/2023)   Exercise Vital Sign    Days of Exercise per Week: 0 days    Minutes of Exercise per Session: 0 min  Stress:  Stress Concern Present (01/25/2023)   [employer] of Occupational Health - Occupational Stress Questionnaire    Feeling of Stress : To some extent  Social Connections: Not on file  Intimate Partner Violence: Not At Risk (03/06/2023)   Humiliation, Afraid, Rape, and Kick questionnaire    Fear of Current or Ex-Partner: No    Emotionally Abused: No    Physically Abused: No    Sexually Abused: No    FAMILY HISTORY: Family History  Problem Relation Age of Onset   Cancer Mother        cervical, breast, lungm skin, bladder-ex smoker   Hypertension Mother    Diabetes Father    Heart attack Maternal Grandfather    Multiple sclerosis Paternal Grandmother    Allergic rhinitis Neg Hx    Angioedema Neg Hx  Asthma Neg Hx    Eczema Neg Hx    Urticaria Neg Hx    Colon cancer Neg Hx    Esophageal cancer Neg Hx    Liver disease Neg Hx    Pancreatic cancer Neg Hx    Prostate cancer Neg Hx    Rectal cancer Neg Hx    Stomach cancer Neg Hx    Colon polyps Neg Hx     ALLERGIES:  is allergic to amlodipine, atorvastatin, crestor [rosuvastatin], irbesartan, metformin and related, metronidazole, penicillins, xanax [alprazolam], glyburide, linagliptin, and moxifloxacin.  MEDICATIONS:  Current Outpatient Medications  Medication Sig Dispense Refill   acyclovir (ZOVIRAX) 400 MG tablet Take 1 tablet (400 mg total) by mouth 2 (two) times daily. 180 tablet 1   albuterol (PROAIR HFA) 108 (90 Base) MCG/ACT inhaler Inhale 2 puffs into the lungs every 4 (four) hours as needed for wheezing or shortness of breath. 3 Inhaler 0   Blood Glucose Monitoring Suppl (ACCU-CHEK AVIVA PLUS) w/Device KIT USE AS DIRECTED DAILY. E11.9 1 kit 0   Continuous Blood Gluc Receiver (FREESTYLE LIBRE 2 READER) DEVI Use to check blood sugar three times daily. E11.49 1 each 0   Continuous Blood Gluc Sensor (FREESTYLE LIBRE 2 SENSOR) MISC Use to check blood sugar three times daily. Change sensors once every 14 days. E11.49 2 each  3   glucose blood (FREESTYLE TEST STRIPS) test strip Use to check blood sugar three times daily. E11.49 100 each 3   hydrOXYzine (ATARAX) 25 MG tablet Take 25 mg by mouth 2 (two) times daily.     ibuprofen (ADVIL) 800 MG tablet TAKE 1 TABLET (800 MG TOTAL) BY MOUTH EVERY 12 HOURS AS NEEDED 180 tablet 0   Lancet Devices (ACCU-CHEK SOFTCLIX) lancets Use as instructed daily. 1 each 5   Lancets (FREESTYLE) lancets Use to check blood sugar three times daily. E11.49 100 each 3   methocarbamol (ROBAXIN) 750 MG tablet Take 1 tablet (750 mg total) by mouth every 8 (eight) hours as needed for muscle spasms. 90 tablet 3   metoprolol tartrate (LOPRESSOR) 100 MG tablet Take 1 tablet (100 mg total) by mouth once for 1 dose. Take 1 tablet (100mg ) two hours prior to Cardiac CTA 1 tablet 0   Multiple Vitamin (MULTI-VITAMIN DAILY) TABS Take 1 tablet by mouth daily as needed.     norethindrone (MICRONOR) 0.35 MG tablet Take 1 tablet (0.35 mg total) by mouth daily. 28 tablet 5   omeprazole (PRILOSEC) 40 MG capsule Take 1 tab every morning. 90 capsule 1   pregabalin (LYRICA) 100 MG capsule Take 1 capsule (100 mg total) by mouth 2 (two) times daily. 60 capsule 5   saccharomyces boulardii (FLORASTOR) 250 MG capsule Take 1 capsule (250 mg total) by mouth 2 (two) times daily. 60 capsule 0   Semaglutide,0.25 or 0.5MG /DOS, (OZEMPIC, 0.25 OR 0.5 MG/DOSE,) 2 MG/1.5ML SOPN Inject 0.25 mg into the skin once a week. 2 mL 6   No current facility-administered medications for this visit.     PHYSICAL EXAMINATION: ECOG PERFORMANCE STATUS: 0 - Asymptomatic  Vitals:   03/06/23 0940  BP: (!) 170/91  Pulse: 79  Resp: 18  Temp: (!) 97.3 F (36.3 C)  SpO2: 100%   Filed Weights   03/06/23 0940  Weight: 164 lb 1.6 oz (74.4 kg)    GENERAL:alert, no distress and comfortable SKIN: skin color, texture, turgor are normal, no rashes or significant lesions EYES: normal, conjunctiva are pink and non-injected, sclera  clear OROPHARYNX:no  exudate, no erythema and lips, buccal mucosa, and tongue normal  NECK: supple, thyroid normal size, non-tender, without nodularity LYMPH:  no palpable lymphadenopathy in the cervical, axillary LUNGS: clear to auscultation and percussion with normal breathing effort HEART: regular rate & rhythm and no murmurs and no lower extremity edema ABDOMEN:abdomen soft, non-tender and normal bowel sounds Musculoskeletal:no cyanosis of digits and no clubbing  PSYCH: alert & oriented x 3 with fluent speech NEURO: no focal motor/sensory deficits  LABORATORY DATA:  I have reviewed the data as listed Lab Results  Component Value Date   WBC 11.2 (H) 02/05/2023   HGB 10.8 (L) 02/05/2023   HCT 33.9 (L) 02/05/2023   MCV 89 02/05/2023   PLT 312 02/05/2023     Chemistry      Component Value Date/Time   NA 140 02/17/2023 1009   K 5.1 02/17/2023 1009   CL 105 02/17/2023 1009   CO2 18 (L) 02/17/2023 1009   BUN 15 02/17/2023 1009   CREATININE 0.97 02/17/2023 1009   CREATININE 0.64 10/28/2016 0851      Component Value Date/Time   CALCIUM 9.4 02/17/2023 1009   ALKPHOS 86 12/01/2022 0955   AST 9 12/01/2022 0955   ALT 9 12/01/2022 0955   BILITOT 0.3 12/01/2022 0955       RADIOGRAPHIC STUDIES: I have personally reviewed the radiological images as listed and agreed with the findings in the report. CT CORONARY MORPH W/CTA COR W/SCORE W/CA W/CM &/OR WO/CM  Addendum Date: 02/25/2023   ADDENDUM REPORT: 02/25/2023 16:34 EXAM: OVER-READ INTERPRETATION  CT CHEST The following report is an over-read performed by radiologist Dr. Narda Rutherford of [facility] on 02/25/2023. This over-read does not include interpretation of cardiac or coronary anatomy or pathology. The coronary CTA interpretation by the cardiologist is attached. COMPARISON:  None. FINDINGS: Vascular: No aortic atherosclerosis. The included aorta is normal in caliber. Mediastinum/nodes: Small right and left hilar  lymph nodes are not enlarged by size criteria. Unremarkable esophagus. Lungs: No focal airspace disease. 5 mm subpleural left lower lobe pulmonary nodule series 11, image 37. This unchanged from lung bases on abdominal CT 02/28/2018. No further imaging follow-up is needed. No pleural fluid. Central bronchial thickening. Upper abdomen: No acute or unexpected findings. Musculoskeletal: There are no acute or suspicious osseous abnormalities. Thoracic spondylosis with anterior spurring. IMPRESSION: 1. Benign 5 mm left lower lobe pulmonary nodule, stable since 2019. Per Fleischner Society guidelines, no further imaging follow-up is recommended. 2. Mild bronchial thickening likely related smoking history. Electronically Signed   By: Narda Rutherford M.D.   On: 02/25/2023 16:34   Result Date: 02/25/2023 CLINICAL DATA:  Preop evaluation EXAM: Cardiac/Coronary CTA TECHNIQUE: The patient was scanned on a [employer]. FINDINGS: A 100 kV prospective scan was triggered in the descending thoracic aorta at 111 HU's. Axial non-contrast 3 mm slices were carried out through the heart. The data set was analyzed on a dedicated work station and scored using the Agatson method. Gantry rotation speed was 250 msecs and collimation was .6 mm. No beta blockade and 0.8 mg of sl NTG was given. The 3D data set was reconstructed in 5% intervals of the 35-75% of the R-R cycle. Phases were analyzed on a dedicated work station using MPR, MIP and VRT modes. The patient received 100 cc of contrast. Coronary Arteries:  Normal coronary origin.  Left dominance. RCA is small and nondominant.  There is no plaque. Left main is a large artery that gives rise to  LAD and LCX arteries. Calcified plaque in the left main causes 0-24% stenosis. LAD is a large vessel. Mixed plaque in the proximal LAD causes 0-24% stenosis. Mixed plaque in D1 causes 25-49% stenosis LCX is a dominant artery. Mixed plaque in the proximal LCX causes 0-24% stenosis Other  findings: Left Ventricle: Normal size Left Atrium: Mild enlargement Pulmonary Veins: Normal configuration Right Ventricle: Normal size Right Atrium: Mild enlargement Cardiac valves: No calcifications Thoracic aorta: Normal size Pulmonary Arteries: Normal size Systemic Veins: Normal drainage Pericardium: Normal thickness IMPRESSION: 1. Coronary calcium score of 124. This was 99th percentile for age and sex matched control. 2. Total plaque volume 155mm3 which is 88th percentile for age and sex-matched controls (calcified plaque 74mm3; noncalcified plaque 117mm3). TPV is moderate. 3. Normal coronary origin with left dominance 4. Nonobstructive CAD 5. Mixed plaque in D1 causes mild (25-49%) stenosis 6. Minimal (0-24%) stenosis in left main, proximal LAD, and proximal LCX CAD-RADS 2. Mild non-obstructive CAD (25-49%). Consider non-atherosclerotic causes of chest pain. Consider preventive therapy and risk factor modification. Electronically Signed: By: Epifanio Lesches M.D. On: 02/22/2023 15:34   US PELVIC COMPLETE WITH TRANSVAGINAL  Result Date: 02/08/2023 CLINICAL DATA:  menorrhagia EXAM: ULTRASOUND OF PELVIS TECHNIQUE: Transabdominal and transvaginalultrasound examination of the pelvis was performed including evaluation of the uterus, ovaries, adnexal regions, and pelvic cul-de-sac. COMPARISON:  None Available. FINDINGS: Uterusanteverted, 8 x 7 x 5 cm. Myometrium is inhomogeneous which can be seen with adenomyosis. Endometrium is about 9 mm thickness. Possible mass effect on the fundal portion of the endometrium which could be due to a fibroid. The possibility of adenomyosis and fibroid could be better evaluated with MRI. Right ovary Unremarkable, 2.4 x 1.9 x 1.9 cm. Left ovary Large complex cystic lesion in the left adnexa without definite ovarian parenchyma. This measures 11 x 8 x 5 cm. MRI might be helpful in further characterization of this finding. Images of the adnexae demonstrated no additional masses  or fluid collections. IMPRESSION: 1. Heterogeneous appearance of the myometrium with some poorly defined possible mass effect on the fundal portion of the endometrium. These findings could be due to adenomyosis and/or a fibroid. Consider MRI for further evaluation. 2. Large heterogeneous septated cystic mass in the left adnexa. Presumably this arises from the left ovary but no ovarian parenchyma is appreciated. This too could be assessed further with MRI. Electronically Signed   By: Layla Maw M.D.   On: 02/08/2023 17:01    All questions were answered. The patient knows to call the clinic with any problems, questions or concerns. I spent 45 minutes in the care of this patient including H and P, review of records, counseling and coordination of care.     Rachel Moulds, MD 03/06/2023 9:45 AM

## 2023-03-08 ENCOUNTER — Inpatient Hospital Stay: Payer: 59

## 2023-03-08 DIAGNOSIS — D72829 Elevated white blood cell count, unspecified: Secondary | ICD-10-CM | POA: Diagnosis not present

## 2023-03-08 DIAGNOSIS — F17210 Nicotine dependence, cigarettes, uncomplicated: Secondary | ICD-10-CM | POA: Diagnosis not present

## 2023-03-08 DIAGNOSIS — I10 Essential (primary) hypertension: Secondary | ICD-10-CM | POA: Diagnosis not present

## 2023-03-08 DIAGNOSIS — E559 Vitamin D deficiency, unspecified: Secondary | ICD-10-CM

## 2023-03-08 DIAGNOSIS — E119 Type 2 diabetes mellitus without complications: Secondary | ICD-10-CM | POA: Diagnosis not present

## 2023-03-08 DIAGNOSIS — D72825 Bandemia: Secondary | ICD-10-CM

## 2023-03-08 DIAGNOSIS — E785 Hyperlipidemia, unspecified: Secondary | ICD-10-CM | POA: Diagnosis not present

## 2023-03-08 DIAGNOSIS — Z87440 Personal history of urinary (tract) infections: Secondary | ICD-10-CM | POA: Diagnosis not present

## 2023-03-08 LAB — CMP (CANCER CENTER ONLY)
ALT: 7 U/L (ref 0–44)
AST: 9 U/L — ABNORMAL LOW (ref 15–41)
Albumin: 3.8 g/dL (ref 3.5–5.0)
Alkaline Phosphatase: 63 U/L (ref 38–126)
Anion gap: 6 (ref 5–15)
BUN: 11 mg/dL (ref 6–20)
CO2: 28 mmol/L (ref 22–32)
Calcium: 8.7 mg/dL — ABNORMAL LOW (ref 8.9–10.3)
Chloride: 106 mmol/L (ref 98–111)
Creatinine: 0.63 mg/dL (ref 0.44–1.00)
GFR, Estimated: >60 mL/min (ref >60)
Glucose, Bld: 177 mg/dL — ABNORMAL HIGH (ref 70–99)
Potassium: 3.9 mmol/L (ref 3.5–5.1)
Sodium: 140 mmol/L (ref 135–145)
Total Bilirubin: 0.4 mg/dL (ref 0.3–1.2)
Total Protein: 6.7 g/dL (ref 6.5–8.1)

## 2023-03-08 LAB — FERRITIN: Ferritin: 6 ng/mL — ABNORMAL LOW (ref 11–307)

## 2023-03-08 LAB — CBC WITH DIFFERENTIAL/PLATELET
Abs Immature Granulocytes: 0.03 10*3/uL (ref 0.00–0.07)
Basophils Absolute: 0 10*3/uL (ref 0.0–0.1)
Basophils Relative: 0 %
Eosinophils Absolute: 0.1 10*3/uL (ref 0.0–0.5)
Eosinophils Relative: 1 %
HCT: 32.6 % — ABNORMAL LOW (ref 36.0–46.0)
Hemoglobin: 10.6 g/dL — ABNORMAL LOW (ref 12.0–15.0)
Immature Granulocytes: 0 %
Lymphocytes Relative: 21 %
Lymphs Abs: 2.1 10*3/uL (ref 0.7–4.0)
MCH: 28.3 pg (ref 26.0–34.0)
MCHC: 32.5 g/dL (ref 30.0–36.0)
MCV: 86.9 fL (ref 80.0–100.0)
Monocytes Absolute: 0.5 10*3/uL (ref 0.1–1.0)
Monocytes Relative: 5 %
Neutro Abs: 7.3 10*3/uL (ref 1.7–7.7)
Neutrophils Relative %: 73 %
Platelets: 356 10*3/uL (ref 150–400)
RBC: 3.75 MIL/uL — ABNORMAL LOW (ref 3.87–5.11)
RDW: 17.3 % — ABNORMAL HIGH (ref 11.5–15.5)
WBC: 10 10*3/uL (ref 4.0–10.5)
nRBC: 0 % (ref 0.0–0.2)

## 2023-03-08 LAB — LACTATE DEHYDROGENASE: LDH: 119 U/L (ref 98–192)

## 2023-03-08 LAB — IRON AND IRON BINDING CAPACITY (CC-WL,HP ONLY)
Iron: 29 ug/dL (ref 28–170)
Saturation Ratios: 7 % — ABNORMAL LOW (ref 10.4–31.8)
TIBC: 447 ug/dL (ref 250–450)
UIBC: 418 ug/dL (ref 148–442)

## 2023-03-10 ENCOUNTER — Encounter: Payer: Self-pay | Admitting: Family Medicine

## 2023-03-11 ENCOUNTER — Other Ambulatory Visit: Payer: Self-pay | Admitting: Adult Health

## 2023-03-11 DIAGNOSIS — D500 Iron deficiency anemia secondary to blood loss (chronic): Secondary | ICD-10-CM

## 2023-03-11 DIAGNOSIS — D509 Iron deficiency anemia, unspecified: Secondary | ICD-10-CM | POA: Insufficient documentation

## 2023-03-12 ENCOUNTER — Ambulatory Visit
Admission: RE | Admit: 2023-03-12 | Discharge: 2023-03-12 | Disposition: A | Discharge status: Home / Self Care | Payer: 59 | Source: Ambulatory Visit | Attending: Obstetrics & Gynecology | Admitting: Obstetrics & Gynecology

## 2023-03-12 DIAGNOSIS — N8003 Adenomyosis of the uterus: Secondary | ICD-10-CM | POA: Diagnosis not present

## 2023-03-12 DIAGNOSIS — N9489 Other specified conditions associated with female genital organs and menstrual cycle: Secondary | ICD-10-CM

## 2023-03-12 MED ORDER — GADOPICLENOL 0.5 MMOL/ML IV SOLN
7.0000 mL | Freq: Once | INTRAVENOUS | Status: AC | PRN
  Administered 2023-03-12: 7 mL via INTRAVENOUS

## 2023-03-15 ENCOUNTER — Other Ambulatory Visit (HOSPITAL_BASED_OUTPATIENT_CLINIC_OR_DEPARTMENT_OTHER): Payer: Self-pay | Admitting: Obstetrics & Gynecology

## 2023-03-15 DIAGNOSIS — N921 Excessive and frequent menstruation with irregular cycle: Secondary | ICD-10-CM

## 2023-03-15 DIAGNOSIS — D500 Iron deficiency anemia secondary to blood loss (chronic): Secondary | ICD-10-CM

## 2023-03-15 MED ORDER — NORETHINDRONE ACETATE 5 MG PO TABS
2.5000 mg | ORAL_TABLET | Freq: Every day | ORAL | 1 refills | Status: DC
Start: 2023-03-15 — End: 2023-05-16

## 2023-03-17 ENCOUNTER — Telehealth: Payer: Self-pay | Admitting: Hematology and Oncology

## 2023-03-17 NOTE — Telephone Encounter (Signed)
Spoke with patient confirming upcoming appointment  

## 2023-03-18 ENCOUNTER — Ambulatory Visit: Payer: 59 | Admitting: Internal Medicine

## 2023-03-24 ENCOUNTER — Ambulatory Visit: Payer: Medicaid Other | Admitting: Family Medicine

## 2023-03-30 ENCOUNTER — Inpatient Hospital Stay: Payer: 59

## 2023-03-31 ENCOUNTER — Other Ambulatory Visit: Payer: Self-pay

## 2023-03-31 ENCOUNTER — Inpatient Hospital Stay: Payer: 59 | Attending: Internal Medicine

## 2023-03-31 VITALS — BP 135/83 | HR 76 | Temp 99.0°F | Resp 17

## 2023-03-31 DIAGNOSIS — D509 Iron deficiency anemia, unspecified: Secondary | ICD-10-CM | POA: Insufficient documentation

## 2023-03-31 DIAGNOSIS — D500 Iron deficiency anemia secondary to blood loss (chronic): Secondary | ICD-10-CM

## 2023-03-31 MED ORDER — SODIUM CHLORIDE 0.9 % IV SOLN
300.0000 mg | Freq: Once | INTRAVENOUS | Status: AC
  Administered 2023-03-31: 300 mg via INTRAVENOUS
  Filled 2023-03-31: qty 300

## 2023-03-31 MED ORDER — SODIUM CHLORIDE 0.9 % IV SOLN: Freq: Once | INTRAVENOUS | Status: AC

## 2023-03-31 NOTE — Patient Instructions (Signed)

## 2023-04-06 ENCOUNTER — Inpatient Hospital Stay: Payer: 59

## 2023-04-07 ENCOUNTER — Other Ambulatory Visit: Payer: Self-pay

## 2023-04-07 ENCOUNTER — Inpatient Hospital Stay: Payer: 59

## 2023-04-07 VITALS — BP 128/78 | HR 72 | Temp 98.2°F | Resp 18

## 2023-04-07 DIAGNOSIS — D509 Iron deficiency anemia, unspecified: Secondary | ICD-10-CM | POA: Diagnosis not present

## 2023-04-07 DIAGNOSIS — D500 Iron deficiency anemia secondary to blood loss (chronic): Secondary | ICD-10-CM

## 2023-04-07 MED ORDER — SODIUM CHLORIDE 0.9 % IV SOLN: Freq: Once | INTRAVENOUS | Status: AC

## 2023-04-07 MED ORDER — SODIUM CHLORIDE 0.9 % IV SOLN
300.0000 mg | Freq: Once | INTRAVENOUS | Status: AC
  Administered 2023-04-07: 300 mg via INTRAVENOUS
  Filled 2023-04-07: qty 300

## 2023-04-07 NOTE — Progress Notes (Signed)
Patient declined 30 minute post iron infusion wait. Vss. Ambulatory to the lobby.

## 2023-04-07 NOTE — Patient Instructions (Signed)

## 2023-04-09 ENCOUNTER — Other Ambulatory Visit: Payer: Self-pay | Admitting: Family Medicine

## 2023-04-09 ENCOUNTER — Encounter: Payer: Self-pay | Admitting: Family Medicine

## 2023-04-09 DIAGNOSIS — Z8619 Personal history of other infectious and parasitic diseases: Secondary | ICD-10-CM

## 2023-04-09 MED ORDER — ACYCLOVIR 400 MG PO TABS: 400.0000 mg | ORAL_TABLET | Freq: Two times a day (BID) | ORAL | 1 refills | Status: DC

## 2023-04-13 ENCOUNTER — Inpatient Hospital Stay: Payer: 59

## 2023-04-14 ENCOUNTER — Inpatient Hospital Stay: Payer: 59

## 2023-04-14 ENCOUNTER — Other Ambulatory Visit: Payer: Self-pay

## 2023-04-14 VITALS — BP 135/88 | HR 72 | Temp 98.1°F | Resp 18

## 2023-04-14 DIAGNOSIS — D509 Iron deficiency anemia, unspecified: Secondary | ICD-10-CM | POA: Diagnosis not present

## 2023-04-14 DIAGNOSIS — D500 Iron deficiency anemia secondary to blood loss (chronic): Secondary | ICD-10-CM

## 2023-04-14 MED ORDER — SODIUM CHLORIDE 0.9 % IV SOLN: Freq: Once | INTRAVENOUS | Status: AC

## 2023-04-14 MED ORDER — SODIUM CHLORIDE 0.9 % IV SOLN
300.0000 mg | Freq: Once | INTRAVENOUS | Status: AC
  Administered 2023-04-14: 300 mg via INTRAVENOUS
  Filled 2023-04-14: qty 300

## 2023-04-14 NOTE — Progress Notes (Signed)
Pt declined observation period post-venofer infusion. VSS at d/c. Pt ambulated to lobby.

## 2023-04-16 ENCOUNTER — Encounter (HOSPITAL_BASED_OUTPATIENT_CLINIC_OR_DEPARTMENT_OTHER): Payer: Self-pay | Admitting: Obstetrics & Gynecology

## 2023-04-20 ENCOUNTER — Telehealth (HOSPITAL_BASED_OUTPATIENT_CLINIC_OR_DEPARTMENT_OTHER): Payer: Self-pay | Admitting: Obstetrics & Gynecology

## 2023-04-20 ENCOUNTER — Ambulatory Visit (HOSPITAL_BASED_OUTPATIENT_CLINIC_OR_DEPARTMENT_OTHER): Payer: 59

## 2023-04-20 NOTE — Telephone Encounter (Signed)
Patient called stated she can't leave work will call back to reschedule .

## 2023-05-12 ENCOUNTER — Other Ambulatory Visit: Payer: Self-pay | Admitting: Family Medicine

## 2023-05-12 ENCOUNTER — Encounter: Payer: Self-pay | Admitting: Family Medicine

## 2023-05-12 MED ORDER — SEMAGLUTIDE (2 MG/DOSE) 8 MG/3ML ~~LOC~~ SOPN: 2.0000 mg | PEN_INJECTOR | SUBCUTANEOUS | 2 refills | Status: DC

## 2023-05-16 ENCOUNTER — Other Ambulatory Visit (HOSPITAL_BASED_OUTPATIENT_CLINIC_OR_DEPARTMENT_OTHER): Payer: Self-pay | Admitting: Obstetrics & Gynecology

## 2023-05-16 DIAGNOSIS — N921 Excessive and frequent menstruation with irregular cycle: Secondary | ICD-10-CM

## 2023-05-16 MED ORDER — NORETHINDRONE ACETATE 5 MG PO TABS: 5.0000 mg | ORAL_TABLET | Freq: Every day | ORAL | 1 refills | Status: DC

## 2023-06-01 ENCOUNTER — Encounter: Payer: Self-pay | Admitting: Hematology and Oncology

## 2023-06-02 ENCOUNTER — Inpatient Hospital Stay: Payer: 59 | Attending: Internal Medicine

## 2023-06-02 ENCOUNTER — Other Ambulatory Visit: Payer: Self-pay

## 2023-06-02 ENCOUNTER — Telehealth: Payer: Self-pay

## 2023-06-02 DIAGNOSIS — D500 Iron deficiency anemia secondary to blood loss (chronic): Secondary | ICD-10-CM

## 2023-06-02 DIAGNOSIS — N926 Irregular menstruation, unspecified: Secondary | ICD-10-CM | POA: Insufficient documentation

## 2023-06-02 DIAGNOSIS — D509 Iron deficiency anemia, unspecified: Secondary | ICD-10-CM | POA: Diagnosis not present

## 2023-06-02 LAB — CBC WITH DIFFERENTIAL (CANCER CENTER ONLY)
Abs Immature Granulocytes: 0.02 10*3/uL (ref 0.00–0.07)
Basophils Absolute: 0 10*3/uL (ref 0.0–0.1)
Basophils Relative: 0 %
Eosinophils Absolute: 0.1 10*3/uL (ref 0.0–0.5)
Eosinophils Relative: 1 %
HCT: 40.6 % (ref 36.0–46.0)
Hemoglobin: 13.9 g/dL (ref 12.0–15.0)
Immature Granulocytes: 0 %
Lymphocytes Relative: 16 %
Lymphs Abs: 1.2 10*3/uL (ref 0.7–4.0)
MCH: 31 pg (ref 26.0–34.0)
MCHC: 34.2 g/dL (ref 30.0–36.0)
MCV: 90.4 fL (ref 80.0–100.0)
Monocytes Absolute: 0.6 10*3/uL (ref 0.1–1.0)
Monocytes Relative: 9 %
Neutro Abs: 5.1 10*3/uL (ref 1.7–7.7)
Neutrophils Relative %: 74 %
Platelet Count: 279 10*3/uL (ref 150–400)
RBC: 4.49 MIL/uL (ref 3.87–5.11)
RDW: 17.5 % — ABNORMAL HIGH (ref 11.5–15.5)
WBC Count: 7 10*3/uL (ref 4.0–10.5)
nRBC: 0 % (ref 0.0–0.2)

## 2023-06-02 LAB — SAMPLE TO BLOOD BANK

## 2023-06-02 LAB — IRON AND IRON BINDING CAPACITY (CC-WL,HP ONLY)
Iron: 23 ug/dL — ABNORMAL LOW (ref 28–170)
Saturation Ratios: 6 % — ABNORMAL LOW (ref 10.4–31.8)
TIBC: 375 ug/dL (ref 250–450)
UIBC: 352 ug/dL (ref 148–442)

## 2023-06-02 LAB — FERRITIN: Ferritin: 65 ng/mL (ref 11–307)

## 2023-06-02 NOTE — Telephone Encounter (Signed)
Called Pt regarding MyChart message. Per NP, iron labs ordered. Pt scheduled for same day lab only visit and telephone visit with NP for lab results review. Pt verbalized understanding.

## 2023-06-04 ENCOUNTER — Other Ambulatory Visit: Payer: Self-pay | Admitting: *Deleted

## 2023-06-04 ENCOUNTER — Other Ambulatory Visit: Payer: Self-pay

## 2023-06-04 ENCOUNTER — Inpatient Hospital Stay: Payer: 59 | Admitting: Adult Health

## 2023-06-04 DIAGNOSIS — D500 Iron deficiency anemia secondary to blood loss (chronic): Secondary | ICD-10-CM

## 2023-06-04 NOTE — Progress Notes (Signed)
[facility] Cancer Follow up:    Hoy Register, MD [address] [place] [address]   DIAGNOSIS: Iron deficiency anemia  SUMMARY OF HEMATOLOGIC HISTORY: Initial patient consult with Dr. Al Pimple on Mar 06, 2023 for leukocytosis, testing indicated iron deficiency with a ferritin of 6 and iron saturation of 7%. Venofer given at 300 mg weekly x 3 beginning March 31, 2023.  CURRENT THERAPY: Intermittent IV iron  INTERVAL HISTORY: Amber Rose 47 y.o. female returns for f/u of iron deficiency anemia.  She notes that she has had continued intermittent spotting and is more symptomatic with increased fatigue.   Patient Active Problem List   Diagnosis Date Noted   Iron deficiency anemia 03/11/2023   PMDD (premenstrual dysphoric disorder) 11/17/2022   Abnormal uterine bleeding (AUB) 11/17/2022   Statin intolerance 09/23/2022   Steatorrhea 08/17/2022   Irregular menses 11/28/2021   Perimenopause 11/28/2021   Episode of recurrent major depressive disorder (HCC) 04/09/2021   BMI 33.0-33.9,adult 04/09/2021   Pure hypercholesterolemia 12/23/2020   Menorrhagia with regular cycle 09/13/2020   Well woman exam 09/13/2020   History of herpes zoster 01/17/2018   Overactive bladder 12/28/2017   Recurrent infections 11/19/2017   Cyst of nasal cavity 11/19/2017   Angio-edema 11/19/2017   Abdominal pain, epigastric 11/04/2017   Dysphagia 11/04/2017   Gastroesophageal reflux disease 07/12/2017   Degenerative disc disease at L5-S1 level 05/14/2017   Fibromyalgia 05/14/2017   ADD (attention deficit disorder) 05/14/2017   Urinary incontinence 04/05/2017   Thigh pain 02/02/2017   Anxiety and depression 02/02/2017   Tobacco abuse 02/02/2017   Facial flushing 02/02/2017   Hypertension 10/23/2016   Hidradenitis 10/23/2016   Type II diabetes mellitus, uncontrolled 09/12/2014   Lupus anticoagulant positive 07/03/2014   Vitamin D deficiency 06/06/2014   Benign  essential hypertension 06/06/2014    is allergic to amlodipine, atorvastatin, crestor [rosuvastatin], irbesartan, metformin and related, metronidazole, penicillins, xanax [alprazolam], glyburide, linagliptin, and moxifloxacin.  MEDICAL HISTORY: Past Medical History:  Diagnosis Date   Allergy    Anxiety    Aortic atherosclerosis (HCC)    Arthritis    Chronic UTI    Clostridium difficile infection    Depression    Diabetes mellitus without complication (HCC)    Diverticulosis    Fibromyalgia    GERD (gastroesophageal reflux disease)    Hiatal hernia    Hyperlipidemia    Hypertension    Internal hemorrhoids    Meningitis, viral    Pancreatitis    Pure hypercholesterolemia 12/23/2020   Tubular adenoma of colon     SURGICAL HISTORY: Past Surgical History:  Procedure Laterality Date   APPENDECTOMY  1990   CESAREAN SECTION  2002   CHOLECYSTECTOMY  2011   COLONOSCOPY     UPPER GASTROINTESTINAL ENDOSCOPY     URINARY SURGERY     urethra sling, removal, and then revision 2016 (4 surgeries)   WISDOM TOOTH EXTRACTION      SOCIAL HISTORY: Social History   Socioeconomic History   Marital status: Single    Spouse name: Not on file   Number of children: 3   Years of education: Not on file   Highest education level: Not on file  Occupational History   Not on file  Tobacco Use   Smoking status: Every Day    Current packs/day: 0.10    Average packs/day: 0.1 packs/day for 20.0 years (2.0 ttl pk-yrs)    Types: Cigarettes   Smokeless tobacco: Never  Tobacco comments:    Patient is engaged in health coaching for smoking cessation as of 12/26/20  Vaping Use   Vaping status: Never Used  Substance and Sexual Activity   Alcohol use: Yes    Comment: occ   Drug use: Not Currently    Types: Marijuana   Sexual activity: Not Currently    Birth control/protection: Surgical    Comment: pt on her mentral cycle now began 11-14-17; BTL  Other Topics Concern   Not on file  Social  History Narrative   Lives home with adult niece.  Not working.  Disability pending.  Pt is single.  Education GED.  3 children.    Social Determinants of Health   Financial Resource Strain: Low Risk  (01/25/2023)   Overall Financial Resource Strain (CARDIA)    Difficulty of Paying Living Expenses: Not hard at all  Food Insecurity: Food Insecurity Present (01/25/2023)   Hunger Vital Sign    Worried About Running Out of Food in the Last Year: Sometimes true    Ran Out of Food in the Last Year: Sometimes true  Transportation Needs: No Transportation Needs (01/25/2023)   PRAPARE - Administrator, Civil Service (Medical): No    Lack of Transportation (Non-Medical): No  Physical Activity: Inactive (01/25/2023)   Exercise Vital Sign    Days of Exercise per Week: 0 days    Minutes of Exercise per Session: 0 min  Stress: Stress Concern Present (01/25/2023)   [employer] of Occupational Health - Occupational Stress Questionnaire    Feeling of Stress : To some extent  Social Connections: Not on file  Intimate Partner Violence: Not At Risk (03/06/2023)   Humiliation, Afraid, Rape, and Kick questionnaire    Fear of Current or Ex-Partner: No    Emotionally Abused: No    Physically Abused: No    Sexually Abused: No    FAMILY HISTORY: Family History  Problem Relation Age of Onset   Cancer Mother        cervical, breast, lungm skin, bladder-ex smoker   Hypertension Mother    Diabetes Father    Heart attack Maternal Grandfather    Multiple sclerosis Paternal Grandmother    Allergic rhinitis Neg Hx    Angioedema Neg Hx    Asthma Neg Hx    Eczema Neg Hx    Urticaria Neg Hx    Colon cancer Neg Hx    Esophageal cancer Neg Hx    Liver disease Neg Hx    Pancreatic cancer Neg Hx    Prostate cancer Neg Hx    Rectal cancer Neg Hx    Stomach cancer Neg Hx    Colon polyps Neg Hx     Review of Systems  Constitutional:  Positive for fatigue. Negative for appetite change, chills, fever  and unexpected weight change.  HENT:   Negative for hearing loss, lump/mass and trouble swallowing.   Eyes:  Negative for eye problems and icterus.  Respiratory:  Negative for chest tightness, cough and shortness of breath.   Cardiovascular:  Negative for chest pain, leg swelling and palpitations.  Gastrointestinal:  Negative for abdominal distention, abdominal pain, constipation, diarrhea, nausea and vomiting.  Endocrine: Negative for hot flashes.  Genitourinary:  Negative for difficulty urinating.   Musculoskeletal:  Negative for arthralgias.  Skin:  Negative for itching and rash.  Neurological:  Negative for dizziness, extremity weakness, headaches and numbness.  Hematological:  Negative for adenopathy. Does not bruise/bleed easily.  Psychiatric/Behavioral:  Negative  for depression. The patient is not nervous/anxious.       PHYSICAL EXAMINATION Patient sounds well.  She is in no apparent distress.  Mood and behavior are normal.  Speech is normal.   LABORATORY DATA:  CBC    Component Value Date/Time   WBC 7.0 06/02/2023 1117   WBC 10.0 03/08/2023 1316   RBC 4.49 06/02/2023 1117   HGB 13.9 06/02/2023 1117   HGB 10.8 (L) 02/05/2023 1147   HCT 40.6 06/02/2023 1117   HCT 33.9 (L) 02/05/2023 1147   PLT 279 06/02/2023 1117   PLT 312 02/05/2023 1147   MCV 90.4 06/02/2023 1117   MCV 89 02/05/2023 1147   MCH 31.0 06/02/2023 1117   MCHC 34.2 06/02/2023 1117   RDW 17.5 (H) 06/02/2023 1117   RDW 14.8 02/05/2023 1147   LYMPHSABS 1.2 06/02/2023 1117   LYMPHSABS 2.0 02/05/2023 1147   MONOABS 0.6 06/02/2023 1117   EOSABS 0.1 06/02/2023 1117   EOSABS 0.0 02/05/2023 1147   BASOSABS 0.0 06/02/2023 1117   BASOSABS 0.0 02/05/2023 1147     ASSESSMENT and THERAPY PLAN:   Iron deficiency anemia November is a 47 year old woman with iron deficiency anemia here for follow-up to discuss her recent lab testing.  She tolerated her most recent IV Venofer in June without difficulty.  Her  ferritin has improved however her iron saturation is still decreased along with her iron level.  I will order another 3 doses of IV Venofer to be given weekly x 3.  Reviewed with her that the only way to lose iron is to bleed and she is working with gynecology about taking progesterone to help decrease her irregular uterine bleeding.  She wanted her B12 level checked and we added that testing on today.    We will see her back in 10 weeks for labs and follow-up of her iron deficiency.   Follow up instructions:    -Return to cancer center weekly for IV iron and in 10 weeks for labs and f/u with Dr. Al Pimple The patient was provided an opportunity to ask questions and all were answered. The patient agreed with the plan and demonstrated an understanding of the instructions.   The patient was advised to call back or seek an in-person evaluation if the symptoms worsen or if the condition fails to improve as anticipated.   I provided 15 minutes of non face-to-face telephone visit time during this encounter, and > 50% was spent counseling as documented under my assessment & plan.   Lillard Anes, NP 06/04/23 1:31 PM Medical Oncology and Hematology [facility] [address] [place], [place] [address] Tel. [phone]    Fax. [phone]

## 2023-06-04 NOTE — Assessment & Plan Note (Signed)
Amber Rose is a 47 year old woman with iron deficiency anemia here for follow-up to discuss her recent lab testing.  She tolerated her most recent IV Venofer in June without difficulty.  Her ferritin has improved however her iron saturation is still decreased along with her iron level.  I will order another 3 doses of IV Venofer to be given weekly x 3.  Reviewed with her that the only way to lose iron is to bleed and she is working with gynecology about taking progesterone to help decrease her irregular uterine bleeding.  She wanted her B12 level checked and we added that testing on today.    We will see her back in 10 weeks for labs and follow-up of her iron deficiency.

## 2023-06-07 ENCOUNTER — Other Ambulatory Visit: Payer: Self-pay | Admitting: Family Medicine

## 2023-06-07 ENCOUNTER — Other Ambulatory Visit (HOSPITAL_BASED_OUTPATIENT_CLINIC_OR_DEPARTMENT_OTHER): Payer: Self-pay | Admitting: Obstetrics & Gynecology

## 2023-06-07 DIAGNOSIS — N921 Excessive and frequent menstruation with irregular cycle: Secondary | ICD-10-CM

## 2023-06-07 DIAGNOSIS — M79651 Pain in right thigh: Secondary | ICD-10-CM

## 2023-06-07 MED ORDER — NORETHINDRONE ACETATE 5 MG PO TABS
5.0000 mg | ORAL_TABLET | Freq: Two times a day (BID) | ORAL | 1 refills | Status: DC
Start: 2023-06-07 — End: 2023-06-21

## 2023-06-08 ENCOUNTER — Telehealth: Payer: Self-pay | Admitting: Adult Health

## 2023-06-08 ENCOUNTER — Telehealth: Payer: Self-pay | Admitting: Hematology and Oncology

## 2023-06-08 NOTE — Telephone Encounter (Signed)
Scheduled appointments per 8/9 los. Patient is aware of all made appointments.

## 2023-06-09 ENCOUNTER — Other Ambulatory Visit (HOSPITAL_BASED_OUTPATIENT_CLINIC_OR_DEPARTMENT_OTHER): Payer: Self-pay | Admitting: Obstetrics & Gynecology

## 2023-06-10 ENCOUNTER — Encounter: Payer: Self-pay | Admitting: Adult Health

## 2023-06-11 ENCOUNTER — Other Ambulatory Visit: Payer: Self-pay | Admitting: *Deleted

## 2023-06-11 DIAGNOSIS — D500 Iron deficiency anemia secondary to blood loss (chronic): Secondary | ICD-10-CM

## 2023-06-14 ENCOUNTER — Telehealth: Payer: Self-pay | Admitting: Cardiovascular Disease

## 2023-06-14 ENCOUNTER — Inpatient Hospital Stay: Payer: 59

## 2023-06-14 ENCOUNTER — Other Ambulatory Visit: Payer: Self-pay

## 2023-06-14 VITALS — BP 162/94 | HR 76 | Temp 98.4°F | Resp 18

## 2023-06-14 DIAGNOSIS — D500 Iron deficiency anemia secondary to blood loss (chronic): Secondary | ICD-10-CM

## 2023-06-14 DIAGNOSIS — D72825 Bandemia: Secondary | ICD-10-CM

## 2023-06-14 DIAGNOSIS — D509 Iron deficiency anemia, unspecified: Secondary | ICD-10-CM | POA: Diagnosis not present

## 2023-06-14 LAB — CBC WITH DIFFERENTIAL/PLATELET
Abs Immature Granulocytes: 0.03 10*3/uL (ref 0.00–0.07)
Basophils Absolute: 0 10*3/uL (ref 0.0–0.1)
Basophils Relative: 0 %
Eosinophils Absolute: 0.2 10*3/uL (ref 0.0–0.5)
Eosinophils Relative: 2 %
HCT: 40.4 % (ref 36.0–46.0)
Hemoglobin: 13.6 g/dL (ref 12.0–15.0)
Immature Granulocytes: 0 %
Lymphocytes Relative: 18 %
Lymphs Abs: 1.7 10*3/uL (ref 0.7–4.0)
MCH: 30.8 pg (ref 26.0–34.0)
MCHC: 33.7 g/dL (ref 30.0–36.0)
MCV: 91.4 fL (ref 80.0–100.0)
Monocytes Absolute: 0.5 10*3/uL (ref 0.1–1.0)
Monocytes Relative: 6 %
Neutro Abs: 7 10*3/uL (ref 1.7–7.7)
Neutrophils Relative %: 74 %
Platelets: 285 10*3/uL (ref 150–400)
RBC: 4.42 MIL/uL (ref 3.87–5.11)
RDW: 16.1 % — ABNORMAL HIGH (ref 11.5–15.5)
WBC: 9.5 10*3/uL (ref 4.0–10.5)
nRBC: 0 % (ref 0.0–0.2)

## 2023-06-14 LAB — VITAMIN B12: Vitamin B-12: 206 pg/mL (ref 180–914)

## 2023-06-14 MED ORDER — SODIUM CHLORIDE 0.9 % IV SOLN
300.0000 mg | Freq: Once | INTRAVENOUS | Status: AC
  Administered 2023-06-14: 300 mg via INTRAVENOUS
  Filled 2023-06-14: qty 300

## 2023-06-14 MED ORDER — SODIUM CHLORIDE 0.9 % IV SOLN: Freq: Once | INTRAVENOUS | Status: AC

## 2023-06-14 NOTE — Patient Instructions (Signed)
Iron Sucrose Injection What is this medication? IRON SUCROSE (EYE ern SOO krose) treats low levels of iron (iron deficiency anemia) in people with kidney disease. Iron is a mineral that plays an important role in making red blood cells, which carry oxygen from your lungs to the rest of your body. This medicine may be used for other purposes; ask your health care provider or pharmacist if you have questions. COMMON BRAND NAME(S): Venofer What should I tell my care team before I take this medication? They need to know if you have any of these conditions: Anemia not caused by low iron levels Heart disease High levels of iron in the blood Kidney disease Liver disease An unusual or allergic reaction to iron, other medications, foods, dyes, or preservatives Pregnant or trying to get pregnant Breastfeeding How should I use this medication? This medication is for infusion into a vein. It is given in a hospital or clinic setting. Talk to your care team about the use of this medication in children. While this medication may be prescribed for children as young as 2 years for selected conditions, precautions do apply. Overdosage: If you think you have taken too much of this medicine contact a poison control center or emergency room at once. NOTE: This medicine is only for you. Do not share this medicine with others. What if I miss a dose? Keep appointments for follow-up doses. It is important not to miss your dose. Call your care team if you are unable to keep an appointment. What may interact with this medication? Do not take this medication with any of the following: Deferoxamine Dimercaprol Other iron products This medication may also interact with the following: Chloramphenicol Deferasirox This list may not describe all possible interactions. Give your health care provider a list of all the medicines, herbs, non-prescription drugs, or dietary supplements you use. Also tell them if you smoke,  drink alcohol, or use illegal drugs. Some items may interact with your medicine. What should I watch for while using this medication? Visit your care team regularly. Tell your care team if your symptoms do not start to get better or if they get worse. You may need blood work done while you are taking this medication. You may need to follow a special diet. Talk to your care team. Foods that contain iron include: whole grains/cereals, dried fruits, beans, or peas, leafy green vegetables, and organ meats (liver, kidney). What side effects may I notice from receiving this medication? Side effects that you should report to your care team as soon as possible: Allergic reactions--skin rash, itching, hives, swelling of the face, lips, tongue, or throat Low blood pressure--dizziness, feeling faint or lightheaded, blurry vision Shortness of breath Side effects that usually do not require medical attention (report to your care team if they continue or are bothersome): Flushing Headache Joint pain Muscle pain Nausea Pain, redness, or irritation at injection site This list may not describe all possible side effects. Call your doctor for medical advice about side effects. You may report side effects to FDA at 1-800-FDA-1088. Where should I keep my medication? This medication is given in a hospital or clinic and will not be stored at home. NOTE: This sheet is a summary. It may not cover all possible information. If you have questions about this medicine, talk to your doctor, pharmacist, or health care provider.  2024 Elsevier/Gold Standard (2022-04-22 00:00:00)  

## 2023-06-14 NOTE — Progress Notes (Signed)
Pt tolerated IV iron well. Declined to stay for 30 minute post observation period. BP elevated, but patient is feeling ok otherwise.

## 2023-06-14 NOTE — Telephone Encounter (Signed)
Pt sent via mychart to the scheduling pool:     I'm at [facility] now receiving an iron infusion. They seem concerned.   I don't know the last 5 but the one I had today was 170/103 and 163/98 no other symptoms besides fatigue and blurred vision      Good Morning Movita,   Can you tell me a little more about your Blood Pressure?     1. What are your last 5 BP readings?   2. Are you having any other symptoms (ex. Dizziness, headache, blurred vision, passed out)?   3. What is your BP issue?     \  Appointment Request From: [name]  With Provider: Alver Sorrow [facility] [address]]  Preferred Date Range: Any  Preferred Times: Tuesday Morning, Wednesday Morning, Thursday Morning, Friday Morning  Reason for visit: Office Visit  Comments: Blood pressure

## 2023-06-15 ENCOUNTER — Other Ambulatory Visit: Payer: Self-pay | Admitting: Adult Health

## 2023-06-15 NOTE — Progress Notes (Signed)
Helen's b12 level is 206, consistent with insufficiency in her Vitamin b12 levels. I placed orders for weekly injections x 2 then monthly injections.    Lillard Anes, NP 06/15/23 3:40 PM Medical Oncology and Hematology [facility] [address] [place], [place] [address] Tel. [phone]    Fax. [phone]

## 2023-06-16 ENCOUNTER — Ambulatory Visit (INDEPENDENT_AMBULATORY_CARE_PROVIDER_SITE_OTHER): Payer: 59 | Admitting: Obstetrics & Gynecology

## 2023-06-16 ENCOUNTER — Encounter (HOSPITAL_BASED_OUTPATIENT_CLINIC_OR_DEPARTMENT_OTHER): Payer: Self-pay | Admitting: Obstetrics & Gynecology

## 2023-06-16 ENCOUNTER — Other Ambulatory Visit (HOSPITAL_BASED_OUTPATIENT_CLINIC_OR_DEPARTMENT_OTHER): Payer: Self-pay | Admitting: Obstetrics & Gynecology

## 2023-06-16 VITALS — BP 139/89 | HR 73 | Ht 63.0 in | Wt 167.2 lb

## 2023-06-16 DIAGNOSIS — R102 Pelvic and perineal pain: Secondary | ICD-10-CM | POA: Diagnosis not present

## 2023-06-16 DIAGNOSIS — N920 Excessive and frequent menstruation with regular cycle: Secondary | ICD-10-CM | POA: Diagnosis not present

## 2023-06-16 LAB — POCT URINALYSIS DIP (CLINITEK)
Bilirubin, UA: NEGATIVE
Glucose, UA: NEGATIVE mg/dL
Ketones, POC UA: NEGATIVE mg/dL
Leukocytes, UA: NEGATIVE
Nitrite, UA: NEGATIVE
POC PROTEIN,UA: NEGATIVE
Spec Grav, UA: >=1.03 — AB (ref 1.010–1.025)
Urobilinogen, UA: 0.2 E.U./dL
pH, UA: 6 (ref 5.0–8.0)

## 2023-06-16 MED ORDER — DIAZEPAM 10 MG PO TABS: ORAL_TABLET | ORAL | 0 refills | Status: DC

## 2023-06-16 NOTE — Progress Notes (Unsigned)
GYNECOLOGY  VISIT  CC:   ***  HPI: 47 y.o. [id number] Single Black or African American female here for ***.   Past Medical History:  Diagnosis Date   Allergy    Anxiety    Aortic atherosclerosis (HCC)    Arthritis    Chronic UTI    Clostridium difficile infection    Depression    Diabetes mellitus without complication (HCC)    Diverticulosis    Fibromyalgia    GERD (gastroesophageal reflux disease)    Hiatal hernia    Hyperlipidemia    Hypertension    Internal hemorrhoids    Meningitis, viral    Pancreatitis    Pure hypercholesterolemia 12/23/2020   Tubular adenoma of colon     MEDS:   Current Outpatient Medications on File Prior to Visit  Medication Sig Dispense Refill   acyclovir (ZOVIRAX) 400 MG tablet Take 1 tablet (400 mg total) by mouth 2 (two) times daily. 180 tablet 1   albuterol (PROAIR HFA) 108 (90 Base) MCG/ACT inhaler Inhale 2 puffs into the lungs every 4 (four) hours as needed for wheezing or shortness of breath. 3 Inhaler 0   Blood Glucose Monitoring Suppl (ACCU-CHEK AVIVA PLUS) w/Device KIT USE AS DIRECTED DAILY. E11.9 1 kit 0   Continuous Blood Gluc Receiver (FREESTYLE LIBRE 2 READER) DEVI Use to check blood sugar three times daily. E11.49 1 each 0   Continuous Blood Gluc Sensor (FREESTYLE LIBRE 2 SENSOR) MISC Use to check blood sugar three times daily. Change sensors once every 14 days. E11.49 2 each 3   glucose blood (FREESTYLE TEST STRIPS) test strip Use to check blood sugar three times daily. E11.49 100 each 3   hydrOXYzine (ATARAX) 25 MG tablet Take 25 mg by mouth 2 (two) times daily.     ibuprofen (ADVIL) 800 MG tablet TAKE 1 TABLET BY MOUTH EVERY 12  HOURS AS NEEDED 180 tablet 0   Lancet Devices (ACCU-CHEK SOFTCLIX) lancets Use as instructed daily. 1 each 5   Lancets (FREESTYLE) lancets Use to check blood sugar three times daily. E11.49 100 each 3   methocarbamol (ROBAXIN) 750 MG tablet Take 1 tablet (750 mg total) by mouth every 8 (eight) hours as  needed for muscle spasms. 90 tablet 3   metoprolol tartrate (LOPRESSOR) 100 MG tablet Take 1 tablet (100 mg total) by mouth once for 1 dose. Take 1 tablet (100mg ) two hours prior to Cardiac CTA 1 tablet 0   Multiple Vitamin (MULTI-VITAMIN DAILY) TABS Take 1 tablet by mouth daily as needed.     norethindrone (AYGESTIN) 5 MG tablet Take 1 tablet (5 mg total) by mouth 2 (two) times daily. 180 tablet 1   omeprazole (PRILOSEC) 40 MG capsule Take 1 tab every morning. 90 capsule 1   pregabalin (LYRICA) 100 MG capsule Take 1 capsule (100 mg total) by mouth 2 (two) times daily. 60 capsule 5   saccharomyces boulardii (FLORASTOR) 250 MG capsule Take 1 capsule (250 mg total) by mouth 2 (two) times daily. 60 capsule 0   Semaglutide, 2 MG/DOSE, 8 MG/3ML SOPN Inject 2 mg as directed once a week. 3 mL 2   No current facility-administered medications on file prior to visit.    ALLERGIES: Amlodipine, Atorvastatin, Crestor [rosuvastatin], Irbesartan, Metformin and related, Metronidazole, Penicillins, Xanax [alprazolam], Glyburide, Linagliptin, and Moxifloxacin  SH:  ***  ROS  PHYSICAL EXAMINATION:    There were no vitals taken for this visit.    General appearance: alert, cooperative and appears stated age  Neck: no adenopathy, supple, symmetrical, trachea midline and thyroid {CHL AMB PHY EX THYROID NORM [phone]::"normal to inspection and palpation"} CV:  {Exam; heart brief:31539} Lungs:  {pe lungs ob:314451} Breasts: {Exam; breast:13139::"normal appearance, no masses or tenderness"} Abdomen: soft, non-tender; bowel sounds normal; no masses,  no organomegaly Lymph:  no inguinal LAD noted  Pelvic: External genitalia:  no lesions              Urethra:  normal appearing urethra with no masses, tenderness or lesions              Bartholins and Skenes: normal                 Vagina: normal appearing vagina with normal color and discharge, no lesions              Cervix: {CHL AMB PHY EX CERVIX NORM  DEFAULT:[id number]::"no lesions"}              Bimanual Exam:  Uterus:  {CHL AMB PHY EX UTERUS NORM DEFAULT:[phone]::"normal size, contour, position, consistency, mobility, non-tender"}              Adnexa: {CHL AMB PHY EX ADNEXA NO MASS [phone]::"no mass, fullness, tenderness"}              Rectovaginal: {yes no:[id number]}.  Confirms.              Anus:  normal sphincter tone, no lesions  Chaperone, ***, CMA, was present for exam.  Assessment/Plan: There are no diagnoses linked to this encounter.

## 2023-06-18 ENCOUNTER — Ambulatory Visit: Payer: 59 | Admitting: Hematology and Oncology

## 2023-06-18 LAB — URINE CULTURE

## 2023-06-20 NOTE — Progress Notes (Signed)
GYNECOLOGY  VISIT  CC:   irregular spotting  HPI: 47 y.o. [id number] Single Black or African American female here for follow up after starting aygestin 5mg  daily for menorrhagia.  Pt has hx of adenomyosis which is the likely cause of her bleeding.  She is a smoker and cannot be on any estrogen products for bleeding control.  She is not a candidate for lysteda.  Declines IUD use.  Has used Depo provera in the past and declines this due to side effects.  Oral progesterones are really the only current option at this point.  She has just increased to norethindrone to twice daily dosing.  Bleeding isn't heavy but is dark and spotty and just enough to be annoying.    Only other option for treatment for her is hysterectomy, in my opinion, and I think with her significant smoking hx, multiple bladder surgeries, diabetes, that she does have some surgical risks.  She is also not really interested in surgery if possible.  Pt has not undergone an endometrial biopsy which I recommended again today.  She declines having this done today.  We did discuss possible pretreatment medications.  States she really has to plan for this.   She would like her urine checked today as well due to some urinary pressure she's having.    Past Medical History:  Diagnosis Date   Allergy    Anxiety    Aortic atherosclerosis (HCC)    Arthritis    Chronic UTI    Clostridium difficile infection    Depression    Diabetes mellitus without complication (HCC)    Diverticulosis    Fibromyalgia    GERD (gastroesophageal reflux disease)    Hiatal hernia    Hyperlipidemia    Hypertension    Internal hemorrhoids    Meningitis, viral    Pancreatitis    Pure hypercholesterolemia 12/23/2020   Tubular adenoma of colon     MEDS:   Current Outpatient Medications on File Prior to Visit  Medication Sig Dispense Refill   acyclovir (ZOVIRAX) 400 MG tablet Take 1 tablet (400 mg total) by mouth 2 (two) times daily. 180 tablet 1    albuterol (PROAIR HFA) 108 (90 Base) MCG/ACT inhaler Inhale 2 puffs into the lungs every 4 (four) hours as needed for wheezing or shortness of breath. 3 Inhaler 0   Blood Glucose Monitoring Suppl (ACCU-CHEK AVIVA PLUS) w/Device KIT USE AS DIRECTED DAILY. E11.9 1 kit 0   Continuous Blood Gluc Receiver (FREESTYLE LIBRE 2 READER) DEVI Use to check blood sugar three times daily. E11.49 1 each 0   Continuous Blood Gluc Sensor (FREESTYLE LIBRE 2 SENSOR) MISC Use to check blood sugar three times daily. Change sensors once every 14 days. E11.49 2 each 3   glucose blood (FREESTYLE TEST STRIPS) test strip Use to check blood sugar three times daily. E11.49 100 each 3   hydrOXYzine (ATARAX) 25 MG tablet Take 25 mg by mouth 2 (two) times daily.     ibuprofen (ADVIL) 800 MG tablet TAKE 1 TABLET BY MOUTH EVERY 12  HOURS AS NEEDED 180 tablet 0   Lancet Devices (ACCU-CHEK SOFTCLIX) lancets Use as instructed daily. 1 each 5   Lancets (FREESTYLE) lancets Use to check blood sugar three times daily. E11.49 100 each 3   methocarbamol (ROBAXIN) 750 MG tablet Take 1 tablet (750 mg total) by mouth every 8 (eight) hours as needed for muscle spasms. 90 tablet 3   Multiple Vitamin (MULTI-VITAMIN DAILY) TABS Take 1 tablet  by mouth daily as needed.     norethindrone (AYGESTIN) 5 MG tablet Take 1 tablet (5 mg total) by mouth 2 (two) times daily. 180 tablet 1   omeprazole (PRILOSEC) 40 MG capsule Take 1 tab every morning. 90 capsule 1   pregabalin (LYRICA) 100 MG capsule Take 1 capsule (100 mg total) by mouth 2 (two) times daily. 60 capsule 5   saccharomyces boulardii (FLORASTOR) 250 MG capsule Take 1 capsule (250 mg total) by mouth 2 (two) times daily. 60 capsule 0   Semaglutide, 2 MG/DOSE, 8 MG/3ML SOPN Inject 2 mg as directed once a week. 3 mL 2   metoprolol tartrate (LOPRESSOR) 100 MG tablet Take 1 tablet (100 mg total) by mouth once for 1 dose. Take 1 tablet (100mg ) two hours prior to Cardiac CTA 1 tablet 0   No current  facility-administered medications on file prior to visit.    ALLERGIES: Amlodipine, Atorvastatin, Crestor [rosuvastatin], Irbesartan, Metformin and related, Metronidazole, Penicillins, Xanax [alprazolam], Glyburide, Linagliptin, and Moxifloxacin  SH:  single, smoker  Review of Systems  Constitutional: Negative.   Genitourinary:        Irregular spotting    PHYSICAL EXAMINATION:    BP 139/89 (BP Location: Right Arm, Patient Position: Sitting, Cuff Size: Large)   Pulse 73   Ht 5\' 3"  (1.6 m) Comment: Reported  Wt 167 lb 3.2 oz (75.8 kg)   BMI 29.62 kg/m     Physical Exam Constitutional:      Appearance: Normal appearance.  Neurological:     General: No focal deficit present.     Mental Status: She is alert.  Psychiatric:        Mood and Affect: Mood normal.        Behavior: Behavior normal.      Assessment/Plan: 1. Menorrhagia with regular cycle - pt will continue with the norethindrone 5mg  bid.  Advised this can be increased to 10mg  bid dosing if needed as well.  But has just been on this about two weeks so will monitor before adjusting medication again - return for endometrial biopsy  2. Pelvic pressure in female - POCT URINALYSIS DIP (CLINITEK)   Total time with pt:  20 minutes plus documentation time.

## 2023-06-21 ENCOUNTER — Encounter: Payer: Self-pay | Admitting: Family Medicine

## 2023-06-21 ENCOUNTER — Inpatient Hospital Stay: Payer: 59

## 2023-06-21 ENCOUNTER — Other Ambulatory Visit (HOSPITAL_BASED_OUTPATIENT_CLINIC_OR_DEPARTMENT_OTHER): Payer: Self-pay | Admitting: *Deleted

## 2023-06-21 ENCOUNTER — Encounter (HOSPITAL_BASED_OUTPATIENT_CLINIC_OR_DEPARTMENT_OTHER): Payer: Self-pay | Admitting: Obstetrics & Gynecology

## 2023-06-21 VITALS — BP 152/85 | HR 68 | Temp 98.2°F | Resp 16

## 2023-06-21 DIAGNOSIS — N921 Excessive and frequent menstruation with irregular cycle: Secondary | ICD-10-CM

## 2023-06-21 DIAGNOSIS — D500 Iron deficiency anemia secondary to blood loss (chronic): Secondary | ICD-10-CM

## 2023-06-21 DIAGNOSIS — D509 Iron deficiency anemia, unspecified: Secondary | ICD-10-CM | POA: Diagnosis not present

## 2023-06-21 MED ORDER — CYANOCOBALAMIN 1000 MCG/ML IJ SOLN
1000.0000 ug | Freq: Once | INTRAMUSCULAR | Status: AC
  Administered 2023-06-21: 1000 ug via INTRAMUSCULAR
  Filled 2023-06-21: qty 1

## 2023-06-21 MED ORDER — SODIUM CHLORIDE 0.9 % IV SOLN: Freq: Once | INTRAVENOUS | Status: AC

## 2023-06-21 MED ORDER — SODIUM CHLORIDE 0.9 % IV SOLN
300.0000 mg | Freq: Once | INTRAVENOUS | Status: AC
  Administered 2023-06-21: 300 mg via INTRAVENOUS
  Filled 2023-06-21: qty 300

## 2023-06-21 MED ORDER — NORETHINDRONE ACETATE 5 MG PO TABS: 5.0000 mg | ORAL_TABLET | Freq: Three times a day (TID) | ORAL | 1 refills | Status: DC

## 2023-06-21 NOTE — Patient Instructions (Signed)
 Iron Sucrose Injection What is this medication? IRON SUCROSE (EYE ern SOO krose) treats low levels of iron (iron deficiency anemia) in people with kidney disease. Iron is a mineral that plays an important role in making red blood cells, which carry oxygen from your lungs to the rest of your body. This medicine may be used for other purposes; ask your health care provider or pharmacist if you have questions. COMMON BRAND NAME(S): Venofer What should I tell my care team before I take this medication? They need to know if you have any of these conditions: Anemia not caused by low iron levels Heart disease High levels of iron in the blood Kidney disease Liver disease An unusual or allergic reaction to iron, other medications, foods, dyes, or preservatives Pregnant or trying to get pregnant Breastfeeding How should I use this medication? This medication is for infusion into a vein. It is given in a hospital or clinic setting. Talk to your care team about the use of this medication in children. While this medication may be prescribed for children as young as 2 years for selected conditions, precautions do apply. Overdosage: If you think you have taken too much of this medicine contact a poison control center or emergency room at once. NOTE: This medicine is only for you. Do not share this medicine with others. What if I miss a dose? Keep appointments for follow-up doses. It is important not to miss your dose. Call your care team if you are unable to keep an appointment. What may interact with this medication? Do not take this medication with any of the following: Deferoxamine Dimercaprol Other iron products This medication may also interact with the following: Chloramphenicol Deferasirox This list may not describe all possible interactions. Give your health care provider a list of all the medicines, herbs, non-prescription drugs, or dietary supplements you use. Also tell them if you smoke,  drink alcohol, or use illegal drugs. Some items may interact with your medicine. What should I watch for while using this medication? Visit your care team regularly. Tell your care team if your symptoms do not start to get better or if they get worse. You may need blood work done while you are taking this medication. You may need to follow a special diet. Talk to your care team. Foods that contain iron include: whole grains/cereals, dried fruits, beans, or peas, leafy green vegetables, and organ meats (liver, kidney). What side effects may I notice from receiving this medication? Side effects that you should report to your care team as soon as possible: Allergic reactions--skin rash, itching, hives, swelling of the face, lips, tongue, or throat Low blood pressure--dizziness, feeling faint or lightheaded, blurry vision Shortness of breath Side effects that usually do not require medical attention (report to your care team if they continue or are bothersome): Flushing Headache Joint pain Muscle pain Nausea Pain, redness, or irritation at injection site This list may not describe all possible side effects. Call your doctor for medical advice about side effects. You may report side effects to FDA at 1-800-FDA-1088. Where should I keep my medication? This medication is given in a hospital or clinic. It will not be stored at home. NOTE: This sheet is a summary. It may not cover all possible information. If you have questions about this medicine, talk to your doctor, pharmacist, or health care provider.  2024 Elsevier/Gold Standard (2023-03-19 00:00:00)

## 2023-06-21 NOTE — Progress Notes (Signed)
Pt observed for 10 minutes post Venofer infusion. Pt tolerated Tx well w/out incident. VSS at discharge.  Ambulatory to lobby.

## 2023-06-23 ENCOUNTER — Ambulatory Visit (HOSPITAL_BASED_OUTPATIENT_CLINIC_OR_DEPARTMENT_OTHER): Payer: 59 | Admitting: Cardiovascular Disease

## 2023-06-24 ENCOUNTER — Telehealth: Payer: Self-pay

## 2023-06-24 NOTE — Telephone Encounter (Signed)
Pt has been informed.

## 2023-06-24 NOTE — Telephone Encounter (Signed)
Amber Rose can you resend the referral to endocrinology for this patient, she sent a mychart message saying that she called and they are saying she does not have a referral.

## 2023-06-29 ENCOUNTER — Encounter (HOSPITAL_BASED_OUTPATIENT_CLINIC_OR_DEPARTMENT_OTHER): Payer: Self-pay | Admitting: Obstetrics & Gynecology

## 2023-06-29 ENCOUNTER — Other Ambulatory Visit: Payer: Self-pay | Admitting: Family Medicine

## 2023-06-29 DIAGNOSIS — Z8619 Personal history of other infectious and parasitic diseases: Secondary | ICD-10-CM

## 2023-07-02 MED FILL — Iron Sucrose Inj 20 MG/ML (Fe Equiv): INTRAVENOUS | Qty: 15 | Status: AC

## 2023-07-03 ENCOUNTER — Inpatient Hospital Stay: Payer: 59 | Attending: Internal Medicine

## 2023-07-03 DIAGNOSIS — D509 Iron deficiency anemia, unspecified: Secondary | ICD-10-CM | POA: Insufficient documentation

## 2023-07-05 ENCOUNTER — Other Ambulatory Visit: Payer: Self-pay | Admitting: Family Medicine

## 2023-07-05 ENCOUNTER — Encounter (HOSPITAL_BASED_OUTPATIENT_CLINIC_OR_DEPARTMENT_OTHER): Payer: Self-pay | Admitting: Obstetrics & Gynecology

## 2023-07-05 ENCOUNTER — Encounter: Payer: Self-pay | Admitting: Family Medicine

## 2023-07-05 MED ORDER — SEMAGLUTIDE (2 MG/DOSE) 8 MG/3ML ~~LOC~~ SOPN: 2.0000 mg | PEN_INJECTOR | SUBCUTANEOUS | 2 refills | Status: DC

## 2023-07-06 ENCOUNTER — Other Ambulatory Visit (HOSPITAL_BASED_OUTPATIENT_CLINIC_OR_DEPARTMENT_OTHER): Payer: Self-pay | Admitting: Obstetrics & Gynecology

## 2023-07-06 DIAGNOSIS — M79604 Pain in right leg: Secondary | ICD-10-CM

## 2023-07-06 DIAGNOSIS — M79651 Pain in right thigh: Secondary | ICD-10-CM

## 2023-07-06 DIAGNOSIS — R102 Pelvic and perineal pain: Secondary | ICD-10-CM

## 2023-07-06 NOTE — Telephone Encounter (Signed)
Called pt in response to [employer]. Advised that Dr. Hyacinth Meeker does not advise stopping the progesterone as she will likely start bleeding again and progesterones typically do not cause leg pain or cramping.  Advised that her adenomyosis could cause cramping and pain but not likely the progesterone.  Advised that she has surgical risks for hysterectomy, declined IUD, and cannot take any estrogen product or Myfembree due to smoking and cardiovascular risks. Pt will schedule appt with pain clinic to see if she can get any relief and if not she will call back to set up appt for office visit with Dr. Hyacinth Meeker.

## 2023-07-06 NOTE — Telephone Encounter (Signed)
Referral to pain management has been placed

## 2023-07-09 ENCOUNTER — Telehealth: Payer: Self-pay | Admitting: Hematology and Oncology

## 2023-07-09 ENCOUNTER — Other Ambulatory Visit: Payer: Self-pay | Admitting: Family Medicine

## 2023-07-09 DIAGNOSIS — Z8619 Personal history of other infectious and parasitic diseases: Secondary | ICD-10-CM

## 2023-07-11 ENCOUNTER — Encounter (HOSPITAL_BASED_OUTPATIENT_CLINIC_OR_DEPARTMENT_OTHER): Payer: Self-pay | Admitting: Obstetrics & Gynecology

## 2023-07-13 ENCOUNTER — Other Ambulatory Visit: Payer: Self-pay

## 2023-07-13 ENCOUNTER — Encounter: Payer: Self-pay | Admitting: Family Medicine

## 2023-07-13 DIAGNOSIS — Z111 Encounter for screening for respiratory tuberculosis: Secondary | ICD-10-CM

## 2023-07-14 ENCOUNTER — Inpatient Hospital Stay: Payer: 59

## 2023-07-15 ENCOUNTER — Other Ambulatory Visit (HOSPITAL_COMMUNITY)
Admission: RE | Admit: 2023-07-15 | Discharge: 2023-07-15 | Disposition: A | Discharge status: Home / Self Care | Payer: 59 | Source: Home / Self Care | Attending: Family Medicine | Admitting: Family Medicine

## 2023-07-15 ENCOUNTER — Inpatient Hospital Stay: Payer: 59

## 2023-07-15 VITALS — BP 153/83 | HR 63 | Temp 99.4°F | Resp 16

## 2023-07-15 DIAGNOSIS — D509 Iron deficiency anemia, unspecified: Secondary | ICD-10-CM | POA: Diagnosis not present

## 2023-07-15 DIAGNOSIS — Z111 Encounter for screening for respiratory tuberculosis: Secondary | ICD-10-CM | POA: Insufficient documentation

## 2023-07-15 DIAGNOSIS — D500 Iron deficiency anemia secondary to blood loss (chronic): Secondary | ICD-10-CM

## 2023-07-15 MED ORDER — SODIUM CHLORIDE 0.9 % IV SOLN
300.0000 mg | Freq: Once | INTRAVENOUS | Status: AC
  Administered 2023-07-15: 300 mg via INTRAVENOUS
  Filled 2023-07-15: qty 300

## 2023-07-15 MED ORDER — SODIUM CHLORIDE 0.9 % IV SOLN: Freq: Once | INTRAVENOUS | Status: AC

## 2023-07-15 MED ORDER — CYANOCOBALAMIN 1000 MCG/ML IJ SOLN
1000.0000 ug | Freq: Once | INTRAMUSCULAR | Status: AC
  Administered 2023-07-15: 1000 ug via INTRAMUSCULAR
  Filled 2023-07-15: qty 1

## 2023-07-19 ENCOUNTER — Encounter: Payer: Self-pay | Admitting: Family Medicine

## 2023-07-19 LAB — QUANTIFERON-TB GOLD PLUS: QuantiFERON-TB Gold Plus: NEGATIVE

## 2023-07-19 LAB — QUANTIFERON-TB GOLD PLUS (RQFGPL)
QuantiFERON Mitogen Value: >10 IU/mL
QuantiFERON Nil Value: 0 IU/mL
QuantiFERON TB1 Ag Value: 0.01 IU/mL
QuantiFERON TB2 Ag Value: 0 IU/mL

## 2023-07-20 ENCOUNTER — Other Ambulatory Visit: Payer: Self-pay | Admitting: Family Medicine

## 2023-07-20 ENCOUNTER — Encounter: Payer: Self-pay | Admitting: Family Medicine

## 2023-07-20 MED ORDER — NALTREXONE-BUPROPION HCL ER 8-90 MG PO TB12: ORAL_TABLET | ORAL | 1 refills | Status: DC

## 2023-07-20 NOTE — Telephone Encounter (Signed)
Called pt in response to [employer] and advised that appt is recommended. Pt provided with appt.

## 2023-07-22 ENCOUNTER — Other Ambulatory Visit: Payer: Self-pay | Admitting: Family Medicine

## 2023-07-22 DIAGNOSIS — Z8619 Personal history of other infectious and parasitic diseases: Secondary | ICD-10-CM

## 2023-07-22 NOTE — Telephone Encounter (Signed)
Requested medication (s) are due for refill today: see request  Requested medication (s) are on the active medication list: yes   Last refill:  06/01/23 #15 ml 0 refills  Future visit scheduled: yes in 1 month  Notes to clinic:   how many refills ?     Requested Prescriptions  Pending Prescriptions Disp Refills   acyclovir (ZOVIRAX) 400 MG tablet [Pharmacy Med Name: Acyclovir 400 MG Oral Tablet] 160 tablet     Sig: TAKE 1 TABLET BY MOUTH TWICE  DAILY     Antimicrobials:  Antiviral Agents - Anti-Herpetic Passed - 07/22/2023  7:29 AM      Passed - Valid encounter within last 12 months    Recent Outpatient Visits           5 months ago Type 2 diabetes mellitus with other neurologic complication, without long-term current use of insulin [facility])   [place] [facility] [place], [place] L, RPH-CPP   5 months ago Type 2 diabetes mellitus with other neurologic complication, without long-term current use of insulin (HCC)   [place] [facility] [place], Landa, MD   10 months ago Type 2 diabetes mellitus with other neurologic complication, without long-term current use of insulin (HCC)   [place] [facility] [place], [place], MD   1 year ago Type 2 diabetes mellitus with other neurologic complication, without long-term current use of insulin (HCC)   [place] [facility] Raymond, Odette Horns, MD   1 year ago Type 2 diabetes mellitus with other neurologic complication, without long-term current use of insulin [facility])   [place] [facility] Hoy Register, MD       Future Appointments             In 3 weeks Hoy Register, MD [facility]

## 2023-07-28 DIAGNOSIS — H04123 Dry eye syndrome of bilateral lacrimal glands: Secondary | ICD-10-CM | POA: Diagnosis not present

## 2023-07-28 DIAGNOSIS — H2513 Age-related nuclear cataract, bilateral: Secondary | ICD-10-CM | POA: Diagnosis not present

## 2023-07-28 DIAGNOSIS — H10413 Chronic giant papillary conjunctivitis, bilateral: Secondary | ICD-10-CM | POA: Diagnosis not present

## 2023-07-28 DIAGNOSIS — E119 Type 2 diabetes mellitus without complications: Secondary | ICD-10-CM | POA: Diagnosis not present

## 2023-07-28 LAB — HM DIABETES EYE EXAM

## 2023-07-29 ENCOUNTER — Ambulatory Visit (HOSPITAL_BASED_OUTPATIENT_CLINIC_OR_DEPARTMENT_OTHER): Payer: 59 | Admitting: Obstetrics & Gynecology

## 2023-07-29 ENCOUNTER — Other Ambulatory Visit (HOSPITAL_COMMUNITY)
Admission: RE | Admit: 2023-07-29 | Discharge: 2023-07-29 | Disposition: A | Discharge status: Home / Self Care | Payer: 59 | Source: Ambulatory Visit | Attending: Obstetrics & Gynecology | Admitting: Obstetrics & Gynecology

## 2023-07-29 ENCOUNTER — Encounter: Payer: Self-pay | Admitting: Family Medicine

## 2023-07-29 ENCOUNTER — Encounter (HOSPITAL_BASED_OUTPATIENT_CLINIC_OR_DEPARTMENT_OTHER): Payer: Self-pay | Admitting: Obstetrics & Gynecology

## 2023-07-29 VITALS — BP 153/79 | HR 81 | Ht 63.5 in | Wt 170.6 lb

## 2023-07-29 DIAGNOSIS — Z8742 Personal history of other diseases of the female genital tract: Secondary | ICD-10-CM

## 2023-07-29 DIAGNOSIS — D251 Intramural leiomyoma of uterus: Secondary | ICD-10-CM

## 2023-07-29 DIAGNOSIS — N898 Other specified noninflammatory disorders of vagina: Secondary | ICD-10-CM | POA: Insufficient documentation

## 2023-07-29 DIAGNOSIS — N920 Excessive and frequent menstruation with regular cycle: Secondary | ICD-10-CM

## 2023-07-29 DIAGNOSIS — N912 Amenorrhea, unspecified: Secondary | ICD-10-CM

## 2023-07-29 LAB — ESTRADIOL: Estradiol: 15.8 pg/mL

## 2023-07-29 LAB — FOLLICLE STIMULATING HORMONE: FSH: 1.9 m[IU]/mL

## 2023-07-29 MED ORDER — FLUCONAZOLE 150 MG PO TABS: 150.0000 mg | ORAL_TABLET | Freq: Once | ORAL | 0 refills | Status: AC

## 2023-07-29 MED ORDER — PROGESTERONE MICRONIZED 100 MG PO CAPS
100.0000 mg | ORAL_CAPSULE | Freq: Two times a day (BID) | ORAL | 2 refills | Status: DC
Start: 2023-07-29 — End: 2023-08-30

## 2023-07-29 NOTE — Progress Notes (Signed)
GYNECOLOGY  VISIT  CC:   discuss symptoms/concerns, endometrial biopsy  HPI: 47 y.o. [id number] Single Black or African American female here for endometrial biopsy due to h/o menorrhagia as well as discuss additional symptoms.  Since starting norethindrone, she's been having leg pain that start on the lower right side and extends down. She feels this started when started the progesterone.  H/O menorrhagia which is significantly improved.  She is not interested in IUD use or surgery at this time.  H/o adenomyosis so endometrial ablation is not a good choice.  She has done a lot of research and would like more bio identical option.  Pt understands that I do not know how this will work for her with controlling her bleeding.  Also, she understands I've never had a patient with her side effect profile with norethindrone so feel we should be considering other causes as well.   She is also concerned about hot flashes she's having.  Wants to know if can use estrogen.  Feel testing blood work first is reasonable.  I am concerned about clotting risks with ehr, smoking hx, and any HRT especially if lood work is not clealry menopausal (which she knows I doubt due to bleeding).    Lastly, she's having vulvar and vaginal itching and is concerned about yeast vaginitis.  Will obtain testing.   Past Medical History:  Diagnosis Date   Allergy    Anxiety    Aortic atherosclerosis (HCC)    Arthritis    Chronic UTI    Clostridium difficile infection    Depression    Diabetes mellitus without complication (HCC)    Diverticulosis    Fibromyalgia    GERD (gastroesophageal reflux disease)    Hiatal hernia    Hyperlipidemia    Hypertension    Internal hemorrhoids    Meningitis, viral    Pancreatitis    Pure hypercholesterolemia 12/23/2020   Tubular adenoma of colon     MEDS:   Current Outpatient Medications on File Prior to Visit  Medication Sig Dispense Refill   acyclovir (ZOVIRAX) 400 MG tablet TAKE 1  TABLET BY MOUTH TWICE  DAILY 180 tablet 1   albuterol (PROAIR HFA) 108 (90 Base) MCG/ACT inhaler Inhale 2 puffs into the lungs every 4 (four) hours as needed for wheezing or shortness of breath. 3 Inhaler 0   Blood Glucose Monitoring Suppl (ACCU-CHEK AVIVA PLUS) w/Device KIT USE AS DIRECTED DAILY. E11.9 1 kit 0   Continuous Blood Gluc Receiver (FREESTYLE LIBRE 2 READER) DEVI Use to check blood sugar three times daily. E11.49 1 each 0   Continuous Blood Gluc Sensor (FREESTYLE LIBRE 2 SENSOR) MISC Use to check blood sugar three times daily. Change sensors once every 14 days. E11.49 2 each 3   diazepam (VALIUM) 10 MG tablet Take 1 tablet one hour before procedure, repeat x 1 if needed. 2 tablet 0   glucose blood (FREESTYLE TEST STRIPS) test strip Use to check blood sugar three times daily. E11.49 100 each 3   hydrOXYzine (ATARAX) 25 MG tablet Take 25 mg by mouth 2 (two) times daily.     ibuprofen (ADVIL) 800 MG tablet TAKE 1 TABLET BY MOUTH EVERY 12  HOURS AS NEEDED 180 tablet 0   Lancet Devices (ACCU-CHEK SOFTCLIX) lancets Use as instructed daily. 1 each 5   Lancets (FREESTYLE) lancets Use to check blood sugar three times daily. E11.49 100 each 3   methocarbamol (ROBAXIN) 750 MG tablet Take 1 tablet (750 mg total) by  mouth every 8 (eight) hours as needed for muscle spasms. 90 tablet 3   Multiple Vitamin (MULTI-VITAMIN DAILY) TABS Take 1 tablet by mouth daily as needed.     Naltrexone-buPROPion HCl ER 8-90 MG TB12 Start 1 tablet every morning for 7 days, then 1 tablet twice daily for 7 days, then 2 tablets every morning and one in the evening then 2 tabs twice daily going forward 120 tablet 1   omeprazole (PRILOSEC) 40 MG capsule Take 1 tab every morning. 90 capsule 1   pregabalin (LYRICA) 100 MG capsule Take 1 capsule (100 mg total) by mouth 2 (two) times daily. 60 capsule 5   saccharomyces boulardii (FLORASTOR) 250 MG capsule Take 1 capsule (250 mg total) by mouth 2 (two) times daily. 60 capsule 0    Semaglutide, 2 MG/DOSE, 8 MG/3ML SOPN Inject 2 mg as directed once a week. 3 mL 2   No current facility-administered medications on file prior to visit.    ALLERGIES: Amlodipine, Atorvastatin, Crestor [rosuvastatin], Irbesartan, Metformin and related, Metronidazole, Penicillins, Xanax [alprazolam], Glyburide, Linagliptin, and Moxifloxacin  SH:  single, smoking  Review of Systems  Constitutional:        Hot flashes  Genitourinary:        Vulvar itching    PHYSICAL EXAMINATION:    BP (!) 153/79 (BP Location: Right Arm, Patient Position: Sitting, Cuff Size: Large)   Pulse 81   Ht 5' 3.5" (1.613 m)   Wt 170 lb 9.6 oz (77.4 kg)   BMI 29.75 kg/m     General appearance: alert, cooperative and appears stated age Abdomen: soft, non-tender; bowel sounds normal; no masses,  no organomegaly Lymph:  no inguinal LAD noted  Pelvic: External genitalia:  no lesions              Urethra:  normal appearing urethra with no masses, tenderness or lesions              Bartholins and Skenes: normal                 Vagina:  watery discharge present              Cervix: no lesions               Endometrial biopsy recommended.  Discussed with patient.  Verbal and written consent obtained.   Procedure:  Speculum placed.  Cervix visualized and cleansed with betadine prep.  A single toothed tenaculum was applied to the anterior lip of the cervix.  Endometrial pipelle was advanced through the cervix into the endometrial cavity without difficulty.  Pipelle passed to8cm.  Suction applied and pipelle removed with good tissue sample obtained.  Tenculum removed.  No bleeding noted.  Patient tolerated procedure well.  Chaperone, Ina Homes, CMA, was present for exam.  Assessment/Plan: 1. Intramural leiomyoma of uterus  2. Menorrhagia with regular cycle - endometrial biopsy obtained today - Surgical pathology( [place]/ [facility])  3. Amenorrhea - Follicle stimulating hormone - Estradiol  4. History  of menorrhagia - will switch from norethindrone to micronized progesterone.  Pt is aware she should could have changes in bleeding pattern.  She is willing to take this risk to see if symptoms will improve for her left pain.  Referral to pain management has already been made.  Was declined.  Physical therapy recommended.   - progesterone (PROMETRIUM) 100 MG capsule; Take 1 capsule (100 mg total) by mouth 2 (two) times daily.  Dispense: 60 capsule; Refill: 2  5. Vagina itching - Cervicovaginal ancillary only

## 2023-07-30 ENCOUNTER — Encounter (HOSPITAL_BASED_OUTPATIENT_CLINIC_OR_DEPARTMENT_OTHER): Payer: Self-pay | Admitting: Obstetrics & Gynecology

## 2023-08-02 ENCOUNTER — Other Ambulatory Visit: Payer: Self-pay | Admitting: Family Medicine

## 2023-08-02 DIAGNOSIS — F32A Depression, unspecified: Secondary | ICD-10-CM

## 2023-08-02 LAB — CERVICOVAGINAL ANCILLARY ONLY
Bacterial Vaginitis (gardnerella): NEGATIVE
Candida Glabrata: NEGATIVE
Candida Vaginitis: POSITIVE — AB
Comment: NEGATIVE
Comment: NEGATIVE
Comment: NEGATIVE

## 2023-08-02 LAB — SURGICAL PATHOLOGY

## 2023-08-04 ENCOUNTER — Ambulatory Visit (HOSPITAL_BASED_OUTPATIENT_CLINIC_OR_DEPARTMENT_OTHER): Payer: 59 | Admitting: Obstetrics & Gynecology

## 2023-08-04 ENCOUNTER — Encounter: Payer: Self-pay | Admitting: Family Medicine

## 2023-08-04 ENCOUNTER — Encounter (HOSPITAL_BASED_OUTPATIENT_CLINIC_OR_DEPARTMENT_OTHER): Payer: Self-pay | Admitting: Obstetrics & Gynecology

## 2023-08-04 ENCOUNTER — Other Ambulatory Visit (HOSPITAL_BASED_OUTPATIENT_CLINIC_OR_DEPARTMENT_OTHER): Payer: Self-pay

## 2023-08-04 VITALS — BP 176/94 | HR 75 | Wt 169.2 lb

## 2023-08-04 DIAGNOSIS — R638 Other symptoms and signs concerning food and fluid intake: Secondary | ICD-10-CM | POA: Diagnosis not present

## 2023-08-04 DIAGNOSIS — T50905A Adverse effect of unspecified drugs, medicaments and biological substances, initial encounter: Secondary | ICD-10-CM

## 2023-08-04 DIAGNOSIS — B3731 Acute candidiasis of vulva and vagina: Secondary | ICD-10-CM

## 2023-08-04 DIAGNOSIS — T50905D Adverse effect of unspecified drugs, medicaments and biological substances, subsequent encounter: Secondary | ICD-10-CM

## 2023-08-04 DIAGNOSIS — N898 Other specified noninflammatory disorders of vagina: Secondary | ICD-10-CM | POA: Diagnosis not present

## 2023-08-04 DIAGNOSIS — R3915 Urgency of urination: Secondary | ICD-10-CM

## 2023-08-04 LAB — POCT URINALYSIS DIPSTICK
Bilirubin, UA: NEGATIVE
Glucose, UA: POSITIVE — AB
Ketones, UA: NEGATIVE
Nitrite, UA: NEGATIVE
Protein, UA: POSITIVE — AB
Spec Grav, UA: >=1.03 — AB (ref 1.010–1.025)
Urobilinogen, UA: 0.2 U/dL
pH, UA: 6 (ref 5.0–8.0)

## 2023-08-04 MED ORDER — NITROFURANTOIN MONOHYD MACRO 100 MG PO CAPS
100.0000 mg | ORAL_CAPSULE | Freq: Two times a day (BID) | ORAL | 0 refills | Status: DC
Start: 2023-08-04 — End: 2023-08-16
  Filled 2023-08-04: qty 14, 7d supply, fill #0

## 2023-08-04 MED ORDER — TERCONAZOLE 0.4 % VA CREA
1.0000 | TOPICAL_CREAM | Freq: Every day | VAGINAL | 0 refills | Status: DC
Start: 2023-08-04 — End: 2023-10-13
  Filled 2023-08-04: qty 45, 7d supply, fill #0

## 2023-08-04 MED ORDER — FLUCONAZOLE 150 MG PO TABS
ORAL_TABLET | ORAL | 0 refills | Status: DC
Start: 2023-08-04 — End: 2023-08-30
  Filled 2023-08-04: qty 2, 4d supply, fill #0

## 2023-08-04 NOTE — Progress Notes (Addendum)
GYNECOLOGY  VISIT  CC:   follow up  HPI: 47 y.o. [id number] Single Black or African American female here for follow up after appointment last week.  Feels her yeast symptoms are worse.  Is now externa as well.  Treatment with topical and vaginal anti-fungal will be sent.  She is not sure if she has a UTI because she is having some urgency and dysuria but some of this could be due to skin symptoms.  Will need to sent urine sample.  Pt's blood work is not showing menopause.  One of her bigger complaints is with hot flashes.  Recommended checking hba1c as well as diabetes could be contributing.    Has decided to not go to physical therapy.  She reports she did see a private physical therapist locally.  She cannot recall the address.  Pathology from endometrial biopsy reviewed as well and was normal.  Pt has not started on new progesterone yet.  Does want to be on testosterone as well.  Feel best to use progesterone initially to assess for any symptom changes.  She does have atypical reactions to medications.  Has never had any genetic testing or allergy testing for fillers.  She is open to this.     Past Medical History:  Diagnosis Date   Allergy    Anxiety    Aortic atherosclerosis (HCC)    Arthritis    Chronic UTI    Clostridium difficile infection    Depression    Diabetes mellitus without complication (HCC)    Diverticulosis    Fibromyalgia    GERD (gastroesophageal reflux disease)    Hiatal hernia    Hyperlipidemia    Hypertension    Internal hemorrhoids    Meningitis, viral    Pancreatitis    Pure hypercholesterolemia 12/23/2020   Tubular adenoma of colon     MEDS:   Current Outpatient Medications on File Prior to Visit  Medication Sig Dispense Refill   acyclovir (ZOVIRAX) 400 MG tablet TAKE 1 TABLET BY MOUTH TWICE  DAILY 180 tablet 1   albuterol (PROAIR HFA) 108 (90 Base) MCG/ACT inhaler Inhale 2 puffs into the lungs every 4 (four) hours as needed for wheezing or shortness  of breath. 3 Inhaler 0   Blood Glucose Monitoring Suppl (ACCU-CHEK AVIVA PLUS) w/Device KIT USE AS DIRECTED DAILY. E11.9 1 kit 0   Continuous Blood Gluc Receiver (FREESTYLE LIBRE 2 READER) DEVI Use to check blood sugar three times daily. E11.49 1 each 0   Continuous Blood Gluc Sensor (FREESTYLE LIBRE 2 SENSOR) MISC Use to check blood sugar three times daily. Change sensors once every 14 days. E11.49 2 each 3   diazepam (VALIUM) 10 MG tablet Take 1 tablet one hour before procedure, repeat x 1 if needed. 2 tablet 0   glucose blood (FREESTYLE TEST STRIPS) test strip Use to check blood sugar three times daily. E11.49 100 each 3   hydrOXYzine (ATARAX) 25 MG tablet Take 25 mg by mouth 2 (two) times daily.     ibuprofen (ADVIL) 800 MG tablet TAKE 1 TABLET BY MOUTH EVERY 12  HOURS AS NEEDED 180 tablet 0   Lancet Devices (ACCU-CHEK SOFTCLIX) lancets Use as instructed daily. 1 each 5   Lancets (FREESTYLE) lancets Use to check blood sugar three times daily. E11.49 100 each 3   methocarbamol (ROBAXIN) 750 MG tablet Take 1 tablet (750 mg total) by mouth every 8 (eight) hours as needed for muscle spasms. 90 tablet 3   Multiple Vitamin (  MULTI-VITAMIN DAILY) TABS Take 1 tablet by mouth daily as needed.     Naltrexone-buPROPion HCl ER 8-90 MG TB12 Start 1 tablet every morning for 7 days, then 1 tablet twice daily for 7 days, then 2 tablets every morning and one in the evening then 2 tabs twice daily going forward 120 tablet 1   omeprazole (PRILOSEC) 40 MG capsule Take 1 tab every morning. 90 capsule 1   pregabalin (LYRICA) 100 MG capsule Take 1 capsule (100 mg total) by mouth 2 (two) times daily. 60 capsule 5   progesterone (PROMETRIUM) 100 MG capsule Take 1 capsule (100 mg total) by mouth 2 (two) times daily. 60 capsule 2   saccharomyces boulardii (FLORASTOR) 250 MG capsule Take 1 capsule (250 mg total) by mouth 2 (two) times daily. 60 capsule 0   Semaglutide, 2 MG/DOSE, 8 MG/3ML SOPN Inject 2 mg as directed once a  week. 3 mL 2   No current facility-administered medications on file prior to visit.    ALLERGIES: Amlodipine, Atorvastatin, Crestor [rosuvastatin], Irbesartan, Metformin and related, Metronidazole, Penicillins, Xanax [alprazolam], Glyburide, Linagliptin, and Moxifloxacin  SH:  single, smoking  Review of Systems  Constitutional:        Hot flashes    PHYSICAL EXAMINATION:    BP (!) 176/94 (BP Location: Right Arm, Patient Position: Sitting, Cuff Size: Large)   Pulse 75   Wt 169 lb 3.2 oz (76.7 kg)   BMI 29.50 kg/m     Physical Exam Constitutional:      Appearance: Normal appearance.  Skin:    General: Skin is warm and dry.  Neurological:     General: No focal deficit present.     Mental Status: She is alert.  Psychiatric:        Mood and Affect: Mood normal.        Behavior: Behavior normal.       Assessment/Plan: 1. Urgency of urination - POCT Urinalysis Dipstick - Urine Culture - nitrofurantoin, macrocrystal-monohydrate, (MACROBID) 100 MG capsule; Take 1 capsule (100 mg total) by mouth 2 (two) times daily.  Dispense: 14 capsule; Refill: 0  2. Vaginal itching - Cervicovaginal ancillary only( [place])  3. Abnormal craving - Hemoglobin A1c  4. Yeast vaginitis - terconazole (TERAZOL 7) 0.4 % vaginal cream; Place 1 applicator vaginally at bedtime.  Dispense: 45 g; Refill: 0 - fluconazole (DIFLUCAN) 150 MG tablet; Take 1 and repeat in 3 days  Dispense: 2 tablet; Refill: 0   5.  Atypical reactions to medication - will refer to allergy/immunology to see if pt can be tested for allergies to fillers use in medications

## 2023-08-05 ENCOUNTER — Other Ambulatory Visit (HOSPITAL_BASED_OUTPATIENT_CLINIC_OR_DEPARTMENT_OTHER): Payer: Self-pay

## 2023-08-05 ENCOUNTER — Encounter (HOSPITAL_BASED_OUTPATIENT_CLINIC_OR_DEPARTMENT_OTHER): Payer: Self-pay | Admitting: Family

## 2023-08-05 ENCOUNTER — Encounter (HOSPITAL_BASED_OUTPATIENT_CLINIC_OR_DEPARTMENT_OTHER): Payer: Self-pay | Admitting: Obstetrics & Gynecology

## 2023-08-05 ENCOUNTER — Ambulatory Visit (HOSPITAL_BASED_OUTPATIENT_CLINIC_OR_DEPARTMENT_OTHER): Payer: 59 | Admitting: Family

## 2023-08-05 VITALS — BP 170/110 | HR 72 | Ht 63.0 in | Wt 172.3 lb

## 2023-08-05 DIAGNOSIS — I700 Atherosclerosis of aorta: Secondary | ICD-10-CM

## 2023-08-05 DIAGNOSIS — I10 Essential (primary) hypertension: Secondary | ICD-10-CM

## 2023-08-05 DIAGNOSIS — E785 Hyperlipidemia, unspecified: Secondary | ICD-10-CM | POA: Diagnosis not present

## 2023-08-05 DIAGNOSIS — I25118 Atherosclerotic heart disease of native coronary artery with other forms of angina pectoris: Secondary | ICD-10-CM

## 2023-08-05 LAB — URINE CULTURE

## 2023-08-05 LAB — HEMOGLOBIN A1C
Est. average glucose Bld gHb Est-mCnc: 217 mg/dL
Hgb A1c MFr Bld: 9.2 % — ABNORMAL HIGH (ref 4.8–5.6)

## 2023-08-05 MED ORDER — SPIRONOLACTONE 25 MG PO TABS
12.5000 mg | ORAL_TABLET | Freq: Every day | ORAL | 2 refills | Status: DC
Start: 2023-08-05 — End: 2023-08-13
  Filled 2023-08-05: qty 15, 30d supply, fill #0

## 2023-08-05 NOTE — Patient Instructions (Signed)
Medication Instructions:  Your physician has recommended you make the following change in your medication:   START Spironolactone 12.5mg  (half tablet) once per day    Labwork: We will request Dr. Al Pimple to check your kidney function and potassium with your next labs  Follow-Up: In 1 month with [facility] MD, PharmD, or NP

## 2023-08-05 NOTE — Progress Notes (Signed)
[facility]pertension [facility] Assessment:    Date:  08/05/2023   ID:  Amber Rose, DOB Feb 18, 1976, MRN [id number]  PCP:  Hoy Register, MD  Cardiologist:  None  Nephrologist:  Referring MD: Hoy Register, MD   CC: Hypertension  History of Present Illness:    Amber Rose is a 47 y.o. female with a hx of hypertension, diabetes, tobacco use, aortic atherosclerosis, hyperlipidemia (intolerant to atorvastatin, rosuvastatin) fibromyalgia, depression, anxiety here to follow-up in the [facility].   Initially diagnosed with hypertension in her 30s.  BP initially well controlled but stopped amlodipine and subsequently was elevated.  Primary care provider added hydrochlorothiazide 10/2020.  She did note occasional swelling around her eyes which did not improve when she was off lisinopril.  She established with advanced hypertension clinic 12/12/2020.  Her BP was actually well controlled on lisinopril and hydrochlorothiazide which can continued.  She was referred to the prep program at the [name].  She saw pharmacy team 04/15/21.  She was referred to the prep program.  She noted she was getting most of her food from food pantry and unable to follow low-sodium diet.  She noted dry cough on lisinopril and was switched to irbesartan.  Intolerance to atorvastatin rosuvastatin she was recommended to start Repatha.  She has not been seen in follow-up since that time.  Seen 07/2022 had discontinued her amlodipine as well as irbesartan due to leg pain.  She started on hydralazine 25 mg twice daily. ABI 07/2022 were normal.   Cardiac CTA 02/2023 calcium score of 124 with mild nonobstructive coronary disease.   Last seen by pharmacy team 12/01/22 with BP controlled in clinic 128/84 on no antihypertensive agents and recommended to continue lifestyle changes.   Presents today for follow up. 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 Checking BP intermittently at home reports readings are  "high" but does not recall specific readings. Has been high at recent office visits and iron infusions.  Reports no shortness of breath nor dyspnea on exertion. Reports no chest pain, pressure, or tightness. No orthopnea, PND.  Had some chest congestion which she attributes to her prior Progesterone which has been recently changed. Concerns about a reaction to a medication "filler". Still bothered by leg pain particularly at night and puffiness under her eyes which has been ongoing for some time.   Previous antihypertensives: Amlodipine - leg cramps, face swelling Irbesartan - leg cramps, face swelling  Hydrochlorothiazide Hydralazine - headache, hot flash, leg pain Doxazosin - leg Lisinopril  Candesartan  Past Medical History:  Diagnosis Date   Allergy    Anxiety    Aortic atherosclerosis (HCC)    Arthritis    Chronic UTI    Clostridium difficile infection    Depression    Diabetes mellitus without complication (HCC)    Diverticulosis    Fibromyalgia    GERD (gastroesophageal reflux disease)    Hiatal hernia    Hyperlipidemia    Hypertension    Internal hemorrhoids    Meningitis, viral    Pancreatitis    Pure hypercholesterolemia 12/23/2020   Tubular adenoma of colon     Past Surgical History:  Procedure Laterality Date   APPENDECTOMY  1990   CESAREAN SECTION  2002   CHOLECYSTECTOMY  2011   COLONOSCOPY     UPPER GASTROINTESTINAL ENDOSCOPY     URINARY SURGERY     urethra sling, removal, and then revision 2016 (4 surgeries)   WISDOM TOOTH EXTRACTION      Current Medications:  Current Meds  Medication Sig   acyclovir (ZOVIRAX) 400 MG tablet TAKE 1 TABLET BY MOUTH TWICE  DAILY   albuterol (PROAIR HFA) 108 (90 Base) MCG/ACT inhaler Inhale 2 puffs into the lungs every 4 (four) hours as needed for wheezing or shortness of breath.   Blood Glucose Monitoring Suppl (ACCU-CHEK AVIVA PLUS) w/Device KIT USE AS DIRECTED DAILY. E11.9   Continuous Blood Gluc Receiver (FREESTYLE  LIBRE 2 READER) DEVI Use to check blood sugar three times daily. E11.49   Continuous Blood Gluc Sensor (FREESTYLE LIBRE 2 SENSOR) MISC Use to check blood sugar three times daily. Change sensors once every 14 days. E11.49   diazepam (VALIUM) 10 MG tablet Take 1 tablet one hour before procedure, repeat x 1 if needed.   fluconazole (DIFLUCAN) 150 MG tablet Take 1 and repeat in 3 days   glucose blood (FREESTYLE TEST STRIPS) test strip Use to check blood sugar three times daily. E11.49   hydrOXYzine (ATARAX) 25 MG tablet Take 25 mg by mouth 2 (two) times daily.   ibuprofen (ADVIL) 800 MG tablet TAKE 1 TABLET BY MOUTH EVERY 12  HOURS AS NEEDED   Lancet Devices (ACCU-CHEK SOFTCLIX) lancets Use as instructed daily.   Lancets (FREESTYLE) lancets Use to check blood sugar three times daily. E11.49   methocarbamol (ROBAXIN) 750 MG tablet Take 1 tablet (750 mg total) by mouth every 8 (eight) hours as needed for muscle spasms.   Multiple Vitamin (MULTI-VITAMIN DAILY) TABS Take 1 tablet by mouth daily as needed.   Naltrexone-buPROPion HCl ER 8-90 MG TB12 Start 1 tablet every morning for 7 days, then 1 tablet twice daily for 7 days, then 2 tablets every morning and one in the evening then 2 tabs twice daily going forward   nitrofurantoin, macrocrystal-monohydrate, (MACROBID) 100 MG capsule Take 1 capsule (100 mg total) by mouth 2 (two) times daily.   omeprazole (PRILOSEC) 40 MG capsule Take 1 tab every morning.   pregabalin (LYRICA) 100 MG capsule Take 1 capsule (100 mg total) by mouth 2 (two) times daily.   progesterone (PROMETRIUM) 100 MG capsule Take 1 capsule (100 mg total) by mouth 2 (two) times daily.   saccharomyces boulardii (FLORASTOR) 250 MG capsule Take 1 capsule (250 mg total) by mouth 2 (two) times daily.   Semaglutide, 2 MG/DOSE, 8 MG/3ML SOPN Inject 2 mg as directed once a week.   terconazole (TERAZOL 7) 0.4 % vaginal cream Place 1 applicator vaginally at bedtime.     Allergies:   Amlodipine,  Atorvastatin, Crestor [rosuvastatin], Irbesartan, Metformin and related, Metronidazole, Penicillins, Xanax [alprazolam], Glyburide, Linagliptin, and Moxifloxacin   Social History   Socioeconomic History   Marital status: Single    Spouse name: Not on file   Number of children: 3   Years of education: Not on file   Highest education level: Not on file  Occupational History   Not on file  Tobacco Use   Smoking status: Every Day    Current packs/day: 0.10    Average packs/day: 0.1 packs/day for 20.0 years (2.0 ttl pk-yrs)    Types: Cigarettes   Smokeless tobacco: Never   Tobacco comments:    Patient is engaged in health coaching for smoking cessation as of 12/26/20  Vaping Use   Vaping status: Never Used  Substance and Sexual Activity   Alcohol use: Yes    Comment: occ   Drug use: Not Currently    Types: Marijuana   Sexual activity: Not Currently    Birth control/protection:  Surgical    Comment: pt on her mentral cycle now began 11-14-17; BTL  Other Topics Concern   Not on file  Social History Narrative   Lives home with adult niece.  Not working.  Disability pending.  Pt is single.  Education GED.  3 children.    Social Determinants of Health   Financial Resource Strain: Low Risk  (01/25/2023)   Overall Financial Resource Strain (CARDIA)    Difficulty of Paying Living Expenses: Not hard at all  Food Insecurity: Food Insecurity Present (01/25/2023)   Hunger Vital Sign    Worried About Running Out of Food in the Last Year: Sometimes true    Ran Out of Food in the Last Year: Sometimes true  Transportation Needs: No Transportation Needs (01/25/2023)   PRAPARE - Administrator, Civil Service (Medical): No    Lack of Transportation (Non-Medical): No  Physical Activity: Inactive (01/25/2023)   Exercise Vital Sign    Days of Exercise per Week: 0 days    Minutes of Exercise per Session: 0 min  Stress: Stress Concern Present (01/25/2023)   [employer] of Occupational  Health - Occupational Stress Questionnaire    Feeling of Stress : To some extent  Social Connections: Not on file     Family History: The patient's family history includes Cancer in her mother; Diabetes in her father; Heart attack in her maternal grandfather; Hypertension in her mother; Multiple sclerosis in her paternal grandmother. There is no history of Allergic rhinitis, Angioedema, Asthma, Eczema, Urticaria, Colon cancer, Esophageal cancer, Liver disease, Pancreatic cancer, Prostate cancer, Rectal cancer, Stomach cancer, or Colon polyps.  ROS:   Please see the history of present illness.    All other systems reviewed and are negative.  EKGs/Labs/Other Studies Reviewed:    EKG Interpretation Date/Time:  Thursday August 05 2023 09:13:38 EDT Ventricular Rate:  72 PR Interval:  156 QRS Duration:  74 QT Interval:  348 QTC Calculation: 381 R Axis:   71  Text Interpretation: Normal sinus rhythm Normal ECG Confirmed by Gillian Shields ([id number]) on 08/05/2023 9:24:37 AM    Recent Labs: 08/14/2022: Magnesium 2.0 03/08/2023: ALT 7; BUN 11; Creatinine 0.63; Potassium 3.9; Sodium 140 06/14/2023: Hemoglobin 13.6; Platelets 285   Recent Lipid Panel    Component Value Date/Time   CHOL 213 (H) 12/01/2022 0955   TRIG 105 12/01/2022 0955   HDL 43 12/01/2022 0955   CHOLHDL 5.0 (H) 12/01/2022 0955   CHOLHDL 4.4 10/28/2016 0851   LDLCALC 151 (H) 12/01/2022 0955   LDLDIRECT 51 08/14/2022 1431    Physical Exam:   VS:  BP (!) 170/110 (BP Location: Right Arm, Patient Position: Sitting, Cuff Size: Normal)   Pulse 72   Ht 5\' 3"  (1.6 m)   Wt 172 lb 4.8 oz (78.2 kg)   SpO2 95%   BMI 30.52 kg/m  , BMI Body mass index is 30.52 kg/m. GENERAL:  Well appearing HEENT: Pupils equal round and reactive, fundi not visualized, oral mucosa unremarkable NECK:  No jugular venous distention, waveform within normal limits, carotid upstroke brisk and symmetric, no bruits, no thyromegaly LYMPHATICS:  No  cervical adenopathy LUNGS:  Clear to auscultation bilaterally HEART:  RRR.  PMI not displaced or sustained,S1 and S2 within normal limits, no S3, no S4, no clicks, no rubs, no murmurs ABD:  Flat, positive bowel sounds normal in frequency in pitch, no bruits, no rebound, no guarding, no midline pulsatile mass, no hepatomegaly, no splenomegaly EXT:  2 plus pulses  throughout, no edema, no cyanosis no clubbing SKIN:  No rashes no nodules NEURO:  Cranial nerves II through XII grossly intact, motor grossly intact throughout PSYCH:  Cognitively intact, oriented to person place and time   ASSESSMENT/PLAN:    Nonobstructive CAD / Aortic atherosclerosis / HLD, LDL goal <70 - Intolerant to Rosuvastatin, Atorvastatin. Not presently on Repatha, decliens to take again as reports side effects. WIll refer to Dr. Rennis Golden lipid clinic for further discussion.   HTN -BP not at goal <130/80. Medication intolerances detailed above. Start Spironolactone 12.5mg  daily. Will ask Dr. Al Pimple to collect BMP with upcoming labs 08/13/23.   DM2 - Continue to follow with PCP. 07/2023 A1c 9.2.   Tobacco use - Smoking cessation encouraged. Recommend utilization of [id number].   Screening for Secondary Hypertension:     12/23/2020   10:45 AM  Causes  Drugs/Herbals Screened     - Comments NSAIDS  Renovascular HTN Screened  Sleep Apnea Screened  Hyperthyroidism Screened     - Comments check TSH  Thyroid Disease Screened     - Comments check TSH  Hyperaldosteronism Not Screened     - Comments CT w/o adenoma 2021  Pheochromocytoma Not Screened  Cushing's Syndrome Not Screened  Hyperparathyroidism Not Screened  Coarctation of the Aorta Screened     - Comments BP symmetric  Compliance Screened    Relevant Labs/Studies:    Latest Ref Rng & Units 03/08/2023    1:16 PM 02/17/2023   10:09 AM 12/01/2022    9:55 AM  Basic Labs  Sodium 135 - 145 mmol/L 140  140  139   Potassium 3.5 - 5.1 mmol/L 3.9  5.1  4.1   Creatinine  0.44 - 1.00 mg/dL 1.61  0.96  0.45        Latest Ref Rng & Units 11/28/2021   11:11 AM 12/12/2020   11:49 AM  Thyroid   TSH 0.450 - 4.500 uIU/mL 1.130  1.090                  Disposition:    FU with MD/PharmD/APP in 1 month  Medication Adjustments/Labs and Tests Ordered: Current medicines are reviewed at length with the patient today.  Concerns regarding medicines are outlined above.  Orders Placed This Encounter  Procedures   EKG 12-Lead   No orders of the defined types were placed in this encounter.  Signed, Alver Sorrow, NP  08/05/2023 9:17 AM    [facility]

## 2023-08-06 ENCOUNTER — Encounter: Payer: Self-pay | Admitting: Family Medicine

## 2023-08-07 ENCOUNTER — Other Ambulatory Visit: Payer: Self-pay | Admitting: Family Medicine

## 2023-08-07 DIAGNOSIS — M79651 Pain in right thigh: Secondary | ICD-10-CM

## 2023-08-07 DIAGNOSIS — M25559 Pain in unspecified hip: Secondary | ICD-10-CM

## 2023-08-07 DIAGNOSIS — R202 Paresthesia of skin: Secondary | ICD-10-CM

## 2023-08-09 ENCOUNTER — Other Ambulatory Visit: Payer: Self-pay

## 2023-08-09 ENCOUNTER — Encounter (HOSPITAL_BASED_OUTPATIENT_CLINIC_OR_DEPARTMENT_OTHER): Payer: Self-pay | Admitting: Obstetrics & Gynecology

## 2023-08-09 ENCOUNTER — Telehealth: Payer: Self-pay

## 2023-08-09 NOTE — Telephone Encounter (Signed)
Pharmacy Patient Advocate Encounter   Received notification from Physician's Office that prior authorization for CONTRAVE is required/requested.   Insurance verification completed.   The patient is insured through  [employer] PART D  .   Per test claim: PA required; PA submitted to [employer] PART D via CoverMyMeds Key/confirmation #/EOC [id number] Status is pending

## 2023-08-09 NOTE — Telephone Encounter (Signed)
Requested by interface surescripts. Future visit in 2 weeks. Requested Prescriptions  Pending Prescriptions Disp Refills   OZEMPIC, 2 MG/DOSE, 8 MG/3ML SOPN [Pharmacy Med Name: OZEMPIC  2MG  SOLUTION PEN-INJECTOR  PEN DOSE] 9 mL 3    Sig: INJECT SUBCUTANEOUSLY 2 MG EVERY WEEK AS DIRECTED     Endocrinology:  Diabetes - GLP-1 Receptor Agonists - semaglutide Failed - 08/07/2023  2:10 PM      Failed - HBA1C in normal range and within 180 days    HbA1c, POC (controlled diabetic range)  Date Value Ref Range Status  02/04/2023 6.0 0.0 - 7.0 % Final   Hgb A1c MFr Bld  Date Value Ref Range Status  08/04/2023 9.2 (H) 4.8 - 5.6 % Final    Comment:             Prediabetes: 5.7 - 6.4          Diabetes: >6.4          Glycemic control for adults with diabetes: <7.0          Failed - Valid encounter within last 6 months    Recent Outpatient Visits           6 months ago Type 2 diabetes mellitus with other neurologic complication, without long-term current use of insulin (HCC)   [place] [facility] [place], Jeannett Senior L, RPH-CPP   6 months ago Type 2 diabetes mellitus with other neurologic complication, without long-term current use of insulin (HCC)   [place] [facility] [place], [place], MD   10 months ago Type 2 diabetes mellitus with other neurologic complication, without long-term current use of insulin (HCC)   [place] [facility] [place], [place], MD   1 year ago Type 2 diabetes mellitus with other neurologic complication, without long-term current use of insulin (HCC)   [place] [facility] [place], [place], MD   1 year ago Type 2 diabetes mellitus with other neurologic complication, without long-term current use of insulin (HCC)   [place] [facility] Hoy Register, MD       Future Appointments             In 2 weeks Hoy Register, MD [facility]   In 3 weeks [place], Odette Horns, MD [place] [facility]   In 1 month  [place] [facility] at [place], [place]   In 4 months [place], Lisette Abu, MD  [facility]Care at [facility] - Cr in normal range and within 360 days    Creatinine  Date Value Ref Range Status  03/08/2023 0.63 0.44 - 1.00 mg/dL Final   Creat  Date Value Ref Range Status  10/28/2016 0.64 0.50 - 1.10 mg/dL Final   Creatinine, Urine  Date Value Ref Range Status  10/28/2016 158 20 - 320 mg/dL Final          ibuprofen (ADVIL) 800 MG tablet [Pharmacy Med Name: Ibuprofen 800 MG Oral Tablet] 180 tablet 3    Sig: TAKE 1 TABLET BY MOUTH EVERY 12  HOURS AS NEEDED     Analgesics:  NSAIDS Failed - 08/07/2023  2:10 PM      Failed - Manual Review: Labs are only required if the patient has taken medication for more than 8 weeks.  Passed - Cr in normal range and within 360 days    Creatinine  Date Value Ref Range Status  03/08/2023 0.63 0.44 - 1.00 mg/dL Final   Creat  Date Value Ref Range Status  10/28/2016 0.64 0.50 - 1.10 mg/dL Final   Creatinine, Urine  Date Value Ref Range Status  10/28/2016 158 20 - 320 mg/dL Final         Passed - HGB in normal range and within 360 days    Hemoglobin  Date Value Ref Range Status  06/14/2023 13.6 12.0 - 15.0 g/dL Final  78/29/5621 30.8 12.0 - 15.0 g/dL Final  65/78/4696 29.5 (L) 11.1 - 15.9 g/dL Final         Passed - PLT in normal range and within 360 days    Platelets  Date Value Ref Range Status  06/14/2023 285 150 - 400 K/uL Final  02/05/2023 312 150 - 450 x10E3/uL Final   Platelet Count  Date Value Ref Range Status  06/02/2023 279 150 - 400 K/uL Final         Passed - HCT in normal range and within 360 days    HCT  Date Value Ref Range Status  06/14/2023 40.4 36.0 - 46.0 % Final   Hematocrit  Date Value Ref Range Status  02/05/2023 33.9 (L) 34.0 -  46.6 % Final         Passed - eGFR is 30 or above and within 360 days    GFR, Est African American  Date Value Ref Range Status  10/28/2016 >89 >=60 mL/min Final   GFR calc Af Amer  Date Value Ref Range Status  12/12/2020 127 >59 mL/min/1.73 Final    Comment:    **In accordance with recommendations from the NKF-ASN Task force,**   [facility] is in the process of updating its eGFR calculation to the   2021 CKD-EPI creatinine equation that estimates kidney function   without a race variable.    GFR, Est Non African American  Date Value Ref Range Status  10/28/2016 >89 >=60 mL/min Final   GFR, Estimated  Date Value Ref Range Status  03/08/2023 >60 >60 mL/min Final    Comment:    (NOTE) Calculated using the CKD-EPI Creatinine Equation (2021)    GFR  Date Value Ref Range Status  02/20/2022 106.09 >60.00 mL/min Final    Comment:    Calculated using the CKD-EPI Creatinine Equation (2021)   eGFR  Date Value Ref Range Status  02/17/2023 73 >59 mL/min/1.73 Final         Passed - Patient is not pregnant      Passed - Valid encounter within last 12 months    Recent Outpatient Visits           6 months ago Type 2 diabetes mellitus with other neurologic complication, without long-term current use of insulin (HCC)   [place] [facility] [place], [place] L, RPH-CPP   6 months ago Type 2 diabetes mellitus with other neurologic complication, without long-term current use of insulin (HCC)   [place] [facility] [place], [place], MD   10 months ago Type 2 diabetes mellitus with other neurologic complication, without long-term current use of insulin (HCC)   [place] [facility] [place], Odette Horns, MD   1 year ago Type 2 diabetes mellitus with other neurologic complication, without long-term current use of insulin (HCC)   [place] [facility] [place],  Odette Horns, MD   1 year ago Type 2  diabetes mellitus with other neurologic complication, without long-term current use of insulin (HCC)   [place] [facility] [place], Odette Horns, MD       Future Appointments             In 2 weeks Hoy Register, MD [facility]   In 3 weeks Hoy Register, MD [place] [facility]   In 1 month  [place] [facility] [place], [place]   In 4 months Hilty, Lisette Abu, MD [place] [facility]             pregabalin (LYRICA) 100 MG capsule [Pharmacy Med Name: Pregabalin 100 MG Oral Capsule] 60 capsule     Sig: TAKE 1 CAPSULE BY MOUTH TWICE  DAILY     Not Delegated - Neurology:  Anticonvulsants - Controlled - pregabalin Failed - 08/07/2023  2:10 PM      Failed - This refill cannot be delegated      Passed - Cr in normal range and within 360 days    Creatinine  Date Value Ref Range Status  03/08/2023 0.63 0.44 - 1.00 mg/dL Final   Creat  Date Value Ref Range Status  10/28/2016 0.64 0.50 - 1.10 mg/dL Final   Creatinine, Urine  Date Value Ref Range Status  10/28/2016 158 20 - 320 mg/dL Final         Passed - Completed PHQ-2 or PHQ-9 in the last 360 days      Passed - Valid encounter within last 12 months    Recent Outpatient Visits           6 months ago Type 2 diabetes mellitus with other neurologic complication, without long-term current use of insulin (HCC)   [place] [facility] [place], Jeannett Senior L, RPH-CPP   6 months ago Type 2 diabetes mellitus with other neurologic complication, without long-term current use of insulin (HCC)   [place] [facility] [place], [place], MD   10 months ago Type 2 diabetes mellitus with other neurologic complication, without long-term current use of insulin (HCC)   [place] [facility] [place], [place], MD   1 year ago Type 2 diabetes mellitus  with other neurologic complication, without long-term current use of insulin (HCC)   [place] [facility] [place], [place], MD   1 year ago Type 2 diabetes mellitus with other neurologic complication, without long-term current use of insulin (HCC)   [place] [facility] Hoy Register, MD       Future Appointments             In 2 weeks Hoy Register, MD [facility]   In 3 weeks Hoy Register, MD [place] [facility]   In 1 month  [place] [facility] at [place], [place]   In 4 months Hilty, Lisette Abu, MD  [facility] at [facility]             omeprazole (PRILOSEC) 40 MG capsule [Pharmacy Med Name: Omeprazole 40 MG Oral Capsule Delayed Release] 100 capsule 0    Sig: TAKE 1 CAPSULE BY MOUTH EVERY  MORNING     Gastroenterology: Proton Pump Inhibitors Passed - 08/07/2023  2:10 PM  Passed - Valid encounter within last 12 months    Recent Outpatient Visits           6 months ago Type 2 diabetes mellitus with other neurologic complication, without long-term current use of insulin (HCC)   [place] [facility] [place], Parker L, RPH-CPP   6 months ago Type 2 diabetes mellitus with other neurologic complication, without long-term current use of insulin (HCC)   [place] [facility] [place], [place], MD   10 months ago Type 2 diabetes mellitus with other neurologic complication, without long-term current use of insulin (HCC)   [place] [facility] [place], Odette Horns, MD   1 year ago Type 2 diabetes mellitus with other neurologic complication, without long-term current use of insulin (HCC)   [place] [facility] [place], Odette Horns, MD   1 year ago Type 2 diabetes mellitus with other neurologic complication, without long-term current use of  insulin [facility])   [place] [facility] Hoy Register, MD       Future Appointments             In 2 weeks Hoy Register, MD [facility]   In 3 weeks Hoy Register, MD [facility]   In 1 month  [place] [facility] at [place], [place]   In 4 months Hilty, Lisette Abu, MD [facility]

## 2023-08-09 NOTE — Telephone Encounter (Signed)
Requested medication (s) are due for refill today: yes  Requested medication (s) are on the active medication list: yes   Last refill:  ozempic- 07/05/23 #33ml  2 refills. Advil - 06/08/23 #180 0 refills  lyrica- 01/29/23 #60 5 refills   Future visit scheduled: yes in 2 weeks   Notes to clinic:  ozempic - do you want to give courtesy refill for same dose?, no refills remain on advil, not delegated per protocol for lyrical, do you want to refill?     Requested Prescriptions  Pending Prescriptions Disp Refills   OZEMPIC, 2 MG/DOSE, 8 MG/3ML SOPN [Pharmacy Med Name: OZEMPIC  2MG  SOLUTION PEN-INJECTOR  PEN DOSE] 9 mL 3    Sig: INJECT SUBCUTANEOUSLY 2 MG EVERY WEEK AS DIRECTED     Endocrinology:  Diabetes - GLP-1 Receptor Agonists - semaglutide Failed - 08/07/2023  2:10 PM      Failed - HBA1C in normal range and within 180 days    HbA1c, POC (controlled diabetic range)  Date Value Ref Range Status  02/04/2023 6.0 0.0 - 7.0 % Final   Hgb A1c MFr Bld  Date Value Ref Range Status  08/04/2023 9.2 (H) 4.8 - 5.6 % Final    Comment:             Prediabetes: 5.7 - 6.4          Diabetes: >6.4          Glycemic control for adults with diabetes: <7.0          Failed - Valid encounter within last 6 months    Recent Outpatient Visits           6 months ago Type 2 diabetes mellitus with other neurologic complication, without long-term current use of insulin (HCC)   [place] [facility] [place], Jeannett Senior L, RPH-CPP   6 months ago Type 2 diabetes mellitus with other neurologic complication, without long-term current use of insulin (HCC)   [place] [facility] [place], [place] [employer], MD   10 months ago Type 2 diabetes mellitus with other neurologic complication, without long-term current use of insulin (HCC)   [place] [facility] [place], [place], MD   1 year ago Type 2 diabetes mellitus with other neurologic complication,  without long-term current use of insulin (HCC)   [place] [facility] [place], [place], MD   1 year ago Type 2 diabetes mellitus with other neurologic complication, without long-term current use of insulin (HCC)   [place] [facility] Hoy Register, MD       Future Appointments             In 2 weeks Hoy Register, MD [name]   In 3 weeks Hoy Register, MD [facility]   In 1 month  [place] [facility] [place], [place]   In 4 months [place], Lisette Abu, MD  [facility] at [facility] - Cr in normal range and within 360 days    Creatinine  Date Value Ref Range Status  03/08/2023 0.63 0.44 - 1.00 mg/dL Final   Creat  Date Value Ref Range Status  10/28/2016 0.64 0.50 - 1.10 mg/dL Final   Creatinine, Urine  Date Value Ref Range Status  10/28/2016 158 20 - 320 mg/dL  Final          ibuprofen (ADVIL) 800 MG tablet [Pharmacy Med Name: Ibuprofen 800 MG Oral Tablet] 180 tablet 3    Sig: TAKE 1 TABLET BY MOUTH EVERY 12  HOURS AS NEEDED     Analgesics:  NSAIDS Failed - 08/07/2023  2:10 PM      Failed - Manual Review: Labs are only required if the patient has taken medication for more than 8 weeks.      Passed - Cr in normal range and within 360 days    Creatinine  Date Value Ref Range Status  03/08/2023 0.63 0.44 - 1.00 mg/dL Final   Creat  Date Value Ref Range Status  10/28/2016 0.64 0.50 - 1.10 mg/dL Final   Creatinine, Urine  Date Value Ref Range Status  10/28/2016 158 20 - 320 mg/dL Final         Passed - HGB in normal range and within 360 days    Hemoglobin  Date Value Ref Range Status  06/14/2023 13.6 12.0 - 15.0 g/dL Final  86/57/8469 62.9 12.0 - 15.0 g/dL Final  52/84/1324 40.1 (L) 11.1 - 15.9 g/dL Final         Passed - PLT in normal range and within 360 days    Platelets  Date  Value Ref Range Status  06/14/2023 285 150 - 400 K/uL Final  02/05/2023 312 150 - 450 x10E3/uL Final   Platelet Count  Date Value Ref Range Status  06/02/2023 279 150 - 400 K/uL Final         Passed - HCT in normal range and within 360 days    HCT  Date Value Ref Range Status  06/14/2023 40.4 36.0 - 46.0 % Final   Hematocrit  Date Value Ref Range Status  02/05/2023 33.9 (L) 34.0 - 46.6 % Final         Passed - eGFR is 30 or above and within 360 days    GFR, Est African American  Date Value Ref Range Status  10/28/2016 >89 >=60 mL/min Final   GFR calc Af Amer  Date Value Ref Range Status  12/12/2020 127 >59 mL/min/1.73 Final    Comment:    **In accordance with recommendations from the NKF-ASN Task force,**   [facility] is in the process of updating its eGFR calculation to the   2021 CKD-EPI creatinine equation that estimates kidney function   without a race variable.    GFR, Est Non African American  Date Value Ref Range Status  10/28/2016 >89 >=60 mL/min Final   GFR, Estimated  Date Value Ref Range Status  03/08/2023 >60 >60 mL/min Final    Comment:    (NOTE) Calculated using the CKD-EPI Creatinine Equation (2021)    GFR  Date Value Ref Range Status  02/20/2022 106.09 >60.00 mL/min Final    Comment:    Calculated using the CKD-EPI Creatinine Equation (2021)   eGFR  Date Value Ref Range Status  02/17/2023 73 >59 mL/min/1.73 Final         Passed - Patient is not pregnant      Passed - Valid encounter within last 12 months    Recent Outpatient Visits           6 months ago Type 2 diabetes mellitus with other neurologic complication, without long-term current use of insulin [facility])   [place] [facility] [place], [place] L, RPH-CPP   6 months ago Type 2 diabetes mellitus with other  neurologic complication, without long-term current use of insulin (HCC)   [place] [facility] [place], [place], MD   10  months ago Type 2 diabetes mellitus with other neurologic complication, without long-term current use of insulin (HCC)   [place] [facility] [place], Odette Horns, MD   1 year ago Type 2 diabetes mellitus with other neurologic complication, without long-term current use of insulin (HCC)   [place] [facility] [place], Odette Horns, MD   1 year ago Type 2 diabetes mellitus with other neurologic complication, without long-term current use of insulin (HCC)   [place] [facility] [place], Odette Horns, MD       Future Appointments             In 2 weeks Hoy Register, MD [facility]   In 3 weeks Hoy Register, MD [place] [facility]   In 1 month  [place] [facility] at [place], [place]   In 4 months Hilty, Lisette Abu, MD [place] [facility]             pregabalin (LYRICA) 100 MG capsule [Pharmacy Med Name: Pregabalin 100 MG Oral Capsule] 60 capsule     Sig: TAKE 1 CAPSULE BY MOUTH TWICE  DAILY     Not Delegated - Neurology:  Anticonvulsants - Controlled - pregabalin Failed - 08/07/2023  2:10 PM      Failed - This refill cannot be delegated      Passed - Cr in normal range and within 360 days    Creatinine  Date Value Ref Range Status  03/08/2023 0.63 0.44 - 1.00 mg/dL Final   Creat  Date Value Ref Range Status  10/28/2016 0.64 0.50 - 1.10 mg/dL Final   Creatinine, Urine  Date Value Ref Range Status  10/28/2016 158 20 - 320 mg/dL Final         Passed - Completed PHQ-2 or PHQ-9 in the last 360 days      Passed - Valid encounter within last 12 months    Recent Outpatient Visits           6 months ago Type 2 diabetes mellitus with other neurologic complication, without long-term current use of insulin (HCC)   [place] [facility] [place], Jeannett Senior L, RPH-CPP   6 months ago Type 2  diabetes mellitus with other neurologic complication, without long-term current use of insulin (HCC)   [place] [facility] [place], [place], MD   10 months ago Type 2 diabetes mellitus with other neurologic complication, without long-term current use of insulin (HCC)   [place] [facility] [place], [place], MD   1 year ago Type 2 diabetes mellitus with other neurologic complication, without long-term current use of insulin (HCC)    [facility] [place], [place], MD   1 year ago Type 2 diabetes mellitus with other neurologic complication, without long-term current use of insulin [facility])    [facility] Hoy Register, MD       Future Appointments             In 2 weeks Hoy Register, MD [facility]   In 3 weeks Hoy Register, MD [facility]   In 1 month  [facility], [facility]   In 4 months Hilty, Lisette Abu, MD [place] [facility]            Signed Prescriptions Disp Refills   omeprazole (PRILOSEC) 40 MG capsule 100 capsule 0    Sig: TAKE 1 CAPSULE BY MOUTH EVERY  MORNING     Gastroenterology: Proton Pump Inhibitors Passed - 08/07/2023  2:10 PM      Passed - Valid encounter within last 12 months    Recent Outpatient Visits           6 months ago Type 2 diabetes mellitus with other neurologic complication, without long-term current use of insulin (HCC)   [place] [facility] [place], [place] L, RPH-CPP   6 months ago Type 2 diabetes mellitus with other neurologic complication, without long-term current use of insulin (HCC)   [place] [facility] [place], [place], MD   10 months ago Type 2 diabetes mellitus with other neurologic complication, without long-term current use of insulin (HCC)    [place] [facility] [place], [place], MD   1 year ago Type 2 diabetes mellitus with other neurologic complication, without long-term current use of insulin (HCC)   [place] [facility] [place], [place], MD   1 year ago Type 2 diabetes mellitus with other neurologic complication, without long-term current use of insulin (HCC)   [place] [facility] Hoy Register, MD       Future Appointments             In 2 weeks Hoy Register, MD [facility]   In 3 weeks Hoy Register, MD [facility]   In 1 month  [place] [facility] at [place], [place]   In 4 months Hilty, Lisette Abu, MD [facility]

## 2023-08-09 NOTE — Addendum Note (Signed)
Addended by: Jerene Bears on: 08/09/2023 06:16 AM   Modules accepted: Orders

## 2023-08-10 ENCOUNTER — Other Ambulatory Visit: Payer: Self-pay

## 2023-08-10 ENCOUNTER — Encounter: Payer: Self-pay | Admitting: Adult Health

## 2023-08-10 NOTE — Telephone Encounter (Signed)
Pharmacy Patient Advocate Encounter  Received notification from [facility] that Prior Authorization for [facility] has been DENIED.  Full denial letter will be uploaded to the media tab. See denial reason below.   PA #/Case ID/Reference #: [id number]

## 2023-08-11 ENCOUNTER — Telehealth (HOSPITAL_BASED_OUTPATIENT_CLINIC_OR_DEPARTMENT_OTHER): Payer: Self-pay | Admitting: *Deleted

## 2023-08-11 ENCOUNTER — Ambulatory Visit (INDEPENDENT_AMBULATORY_CARE_PROVIDER_SITE_OTHER): Payer: 59

## 2023-08-11 VITALS — BP 176/103 | HR 72 | Ht 63.0 in | Wt 163.5 lb

## 2023-08-11 DIAGNOSIS — N390 Urinary tract infection, site not specified: Secondary | ICD-10-CM

## 2023-08-11 LAB — POCT URINALYSIS DIPSTICK
Bilirubin, UA: NEGATIVE
Glucose, UA: POSITIVE — AB
Ketones, UA: NEGATIVE
Leukocytes, UA: NEGATIVE
Nitrite, UA: NEGATIVE
Protein, UA: POSITIVE — AB
Spec Grav, UA: >=1.03 — AB (ref 1.010–1.025)
Urobilinogen, UA: 0.2 U/dL
pH, UA: 5.5 (ref 5.0–8.0)

## 2023-08-11 NOTE — Telephone Encounter (Signed)
Pt expresses concerns of continued bleeding after switching from aygestin to progesterone micronized. She reports that she has increased the progesterone to 2-3 based off of her symptoms and adding the extra has helped with those symptoms. Advised pt that she should only take as prescribed. Pt asked if she could switch back to the aygestin since she had good bleeding control with that. Advised that Dr. Hyacinth Meeker agrees that she can do that but she should not take the aygestin with the other progesterone. Elevated A1C discussed with pt and how that can contribute to some of her urinary symptoms as well as hot flashes. Pt desires referral to endocrinologist. Pt will call for any additional problems or concerns.

## 2023-08-11 NOTE — Progress Notes (Signed)
Pt presented to office for a repeat for a urine sample. Pt complained of having a burning sensation while urinating. Urine culture was sent off.

## 2023-08-12 ENCOUNTER — Encounter (HOSPITAL_BASED_OUTPATIENT_CLINIC_OR_DEPARTMENT_OTHER): Payer: Self-pay | Admitting: Obstetrics & Gynecology

## 2023-08-12 ENCOUNTER — Other Ambulatory Visit (HOSPITAL_BASED_OUTPATIENT_CLINIC_OR_DEPARTMENT_OTHER): Payer: Self-pay | Admitting: Obstetrics & Gynecology

## 2023-08-12 DIAGNOSIS — E1165 Type 2 diabetes mellitus with hyperglycemia: Secondary | ICD-10-CM

## 2023-08-13 ENCOUNTER — Other Ambulatory Visit (HOSPITAL_COMMUNITY): Payer: Self-pay

## 2023-08-13 ENCOUNTER — Inpatient Hospital Stay (HOSPITAL_BASED_OUTPATIENT_CLINIC_OR_DEPARTMENT_OTHER): Payer: 59 | Admitting: Hematology and Oncology

## 2023-08-13 ENCOUNTER — Other Ambulatory Visit: Payer: Self-pay

## 2023-08-13 ENCOUNTER — Encounter: Payer: Self-pay | Admitting: Family Medicine

## 2023-08-13 ENCOUNTER — Inpatient Hospital Stay: Payer: 59 | Attending: Internal Medicine

## 2023-08-13 ENCOUNTER — Encounter: Payer: Self-pay | Admitting: Hematology and Oncology

## 2023-08-13 ENCOUNTER — Encounter (HOSPITAL_BASED_OUTPATIENT_CLINIC_OR_DEPARTMENT_OTHER): Payer: Self-pay

## 2023-08-13 VITALS — BP 169/86 | HR 69 | Temp 98.1°F | Resp 18 | Ht 63.0 in | Wt 169.0 lb

## 2023-08-13 DIAGNOSIS — Z8052 Family history of malignant neoplasm of bladder: Secondary | ICD-10-CM | POA: Insufficient documentation

## 2023-08-13 DIAGNOSIS — I10 Essential (primary) hypertension: Secondary | ICD-10-CM | POA: Insufficient documentation

## 2023-08-13 DIAGNOSIS — Z8719 Personal history of other diseases of the digestive system: Secondary | ICD-10-CM | POA: Insufficient documentation

## 2023-08-13 DIAGNOSIS — Z880 Allergy status to penicillin: Secondary | ICD-10-CM | POA: Diagnosis not present

## 2023-08-13 DIAGNOSIS — Z79624 Long term (current) use of inhibitors of nucleotide synthesis: Secondary | ICD-10-CM | POA: Insufficient documentation

## 2023-08-13 DIAGNOSIS — Z860101 Personal history of adenomatous and serrated colon polyps: Secondary | ICD-10-CM | POA: Diagnosis not present

## 2023-08-13 DIAGNOSIS — Z8269 Family history of other diseases of the musculoskeletal system and connective tissue: Secondary | ICD-10-CM | POA: Insufficient documentation

## 2023-08-13 DIAGNOSIS — M62838 Other muscle spasm: Secondary | ICD-10-CM | POA: Insufficient documentation

## 2023-08-13 DIAGNOSIS — Z8249 Family history of ischemic heart disease and other diseases of the circulatory system: Secondary | ICD-10-CM | POA: Insufficient documentation

## 2023-08-13 DIAGNOSIS — Z79899 Other long term (current) drug therapy: Secondary | ICD-10-CM | POA: Insufficient documentation

## 2023-08-13 DIAGNOSIS — Z833 Family history of diabetes mellitus: Secondary | ICD-10-CM | POA: Insufficient documentation

## 2023-08-13 DIAGNOSIS — Z801 Family history of malignant neoplasm of trachea, bronchus and lung: Secondary | ICD-10-CM | POA: Insufficient documentation

## 2023-08-13 DIAGNOSIS — F17210 Nicotine dependence, cigarettes, uncomplicated: Secondary | ICD-10-CM | POA: Diagnosis not present

## 2023-08-13 DIAGNOSIS — D500 Iron deficiency anemia secondary to blood loss (chronic): Secondary | ICD-10-CM | POA: Diagnosis not present

## 2023-08-13 DIAGNOSIS — D72825 Bandemia: Secondary | ICD-10-CM

## 2023-08-13 DIAGNOSIS — Z87440 Personal history of urinary (tract) infections: Secondary | ICD-10-CM | POA: Diagnosis not present

## 2023-08-13 DIAGNOSIS — Z808 Family history of malignant neoplasm of other organs or systems: Secondary | ICD-10-CM | POA: Insufficient documentation

## 2023-08-13 DIAGNOSIS — E559 Vitamin D deficiency, unspecified: Secondary | ICD-10-CM

## 2023-08-13 DIAGNOSIS — Z7985 Long-term (current) use of injectable non-insulin antidiabetic drugs: Secondary | ICD-10-CM | POA: Diagnosis not present

## 2023-08-13 DIAGNOSIS — Z9049 Acquired absence of other specified parts of digestive tract: Secondary | ICD-10-CM | POA: Insufficient documentation

## 2023-08-13 DIAGNOSIS — D509 Iron deficiency anemia, unspecified: Secondary | ICD-10-CM | POA: Diagnosis present

## 2023-08-13 DIAGNOSIS — N920 Excessive and frequent menstruation with regular cycle: Secondary | ICD-10-CM | POA: Insufficient documentation

## 2023-08-13 DIAGNOSIS — Z803 Family history of malignant neoplasm of breast: Secondary | ICD-10-CM | POA: Diagnosis not present

## 2023-08-13 DIAGNOSIS — E1165 Type 2 diabetes mellitus with hyperglycemia: Secondary | ICD-10-CM | POA: Insufficient documentation

## 2023-08-13 DIAGNOSIS — Z881 Allergy status to other antibiotic agents status: Secondary | ICD-10-CM | POA: Diagnosis not present

## 2023-08-13 DIAGNOSIS — Z79890 Hormone replacement therapy (postmenopausal): Secondary | ICD-10-CM | POA: Diagnosis not present

## 2023-08-13 DIAGNOSIS — M797 Fibromyalgia: Secondary | ICD-10-CM | POA: Insufficient documentation

## 2023-08-13 DIAGNOSIS — Z888 Allergy status to other drugs, medicaments and biological substances status: Secondary | ICD-10-CM | POA: Diagnosis not present

## 2023-08-13 DIAGNOSIS — Z8049 Family history of malignant neoplasm of other genital organs: Secondary | ICD-10-CM | POA: Diagnosis not present

## 2023-08-13 DIAGNOSIS — D72829 Elevated white blood cell count, unspecified: Secondary | ICD-10-CM | POA: Diagnosis not present

## 2023-08-13 DIAGNOSIS — Z8661 Personal history of infections of the central nervous system: Secondary | ICD-10-CM | POA: Insufficient documentation

## 2023-08-13 LAB — CBC WITH DIFFERENTIAL/PLATELET
Abs Immature Granulocytes: 0.05 10*3/uL (ref 0.00–0.07)
Basophils Absolute: 0.1 10*3/uL (ref 0.0–0.1)
Basophils Relative: 1 %
Eosinophils Absolute: 0.1 10*3/uL (ref 0.0–0.5)
Eosinophils Relative: 1 %
HCT: 43.2 % (ref 36.0–46.0)
Hemoglobin: 14.9 g/dL (ref 12.0–15.0)
Immature Granulocytes: 1 %
Lymphocytes Relative: 20 %
Lymphs Abs: 2.2 10*3/uL (ref 0.7–4.0)
MCH: 31.4 pg (ref 26.0–34.0)
MCHC: 34.5 g/dL (ref 30.0–36.0)
MCV: 91.1 fL (ref 80.0–100.0)
Monocytes Absolute: 0.6 10*3/uL (ref 0.1–1.0)
Monocytes Relative: 6 %
Neutro Abs: 8 10*3/uL — ABNORMAL HIGH (ref 1.7–7.7)
Neutrophils Relative %: 71 %
Platelets: 287 10*3/uL (ref 150–400)
RBC: 4.74 MIL/uL (ref 3.87–5.11)
RDW: 13.5 % (ref 11.5–15.5)
WBC: 11 10*3/uL — ABNORMAL HIGH (ref 4.0–10.5)
nRBC: 0 % (ref 0.0–0.2)

## 2023-08-13 LAB — VITAMIN B12: Vitamin B-12: 2833 pg/mL — ABNORMAL HIGH (ref 180–914)

## 2023-08-13 LAB — BASIC METABOLIC PANEL - CANCER CENTER ONLY
Anion gap: 7 (ref 5–15)
BUN: 21 mg/dL — ABNORMAL HIGH (ref 6–20)
CO2: 27 mmol/L (ref 22–32)
Calcium: 9.5 mg/dL (ref 8.9–10.3)
Chloride: 101 mmol/L (ref 98–111)
Creatinine: 0.79 mg/dL (ref 0.44–1.00)
GFR, Estimated: >60 mL/min (ref >60)
Glucose, Bld: 318 mg/dL — ABNORMAL HIGH (ref 70–99)
Potassium: 4.9 mmol/L (ref 3.5–5.1)
Sodium: 135 mmol/L (ref 135–145)

## 2023-08-13 MED ORDER — SPIRONOLACTONE 25 MG PO TABS
25.0000 mg | ORAL_TABLET | Freq: Every day | ORAL | 3 refills | Status: DC
  Filled 2023-08-13 – 2023-08-16 (×2): qty 90, 90d supply, fill #0

## 2023-08-13 NOTE — Progress Notes (Signed)
[facility] CONSULT NOTE  Patient Care Team: Hoy Register, MD as PCP - General (Family Medicine) [name]  CHIEF COMPLAINTS/PURPOSE OF CONSULTATION:  Leukocytosis  ASSESSMENT & PLAN:   This is a very pleasant 47 year old premenopausal female patient with ongoing medical issues including but not limited to fatigue, abdominal symptoms, diabetes, hypertension and fibromyalgia related pain with persistent leukocytosis referred to hematology for the same.    Leukocytosis Fluctuating, CBC from today showed white count of 11,000.  No anemia or thrombocytopenia.  Given reactive leukocytosis being the most likely etiology in this case secondary to fibromyalgia related pain, we will continue to monitor it intermittently.  I do not believe there is any intervention needed at this time.  Menorrhagia Excessive bleeding despite use of Methadrone and bioidentical progesterone. Hemoglobin stable at 14.9. -Continue current treatment and monitor symptoms.  Diabetes Mellitus Elevated blood sugars despite use of Ozempic. Patient reports plateau in weight loss and potential side effects from medication. -Continue Ozempic and monitor blood sugars.  Continue follow-up with PCP as recommended.  Hemorrhoid New onset of hard lump in the rectal area, likely hemorrhoid. No constipation reported. -Monitor symptoms and report to PCP if there is any worsening.  Muscle Spasm Patient reports muscle spasms during certain movements or exercises. -Advise patient to stretch before and after exercise to prevent spasms.  PCP requested that we add BMP today to her labs.,  This will be added and will be forwarded to the primary care physician.  She will continue oral B12, return to clinic in 6 months with repeat labs.  HISTORY OF PRESENTING ILLNESS:  Amber Rose 47 y.o. female is here because of leukocytosis.  This is a very pleasant 47 yr old female patient with PMH of DM, HTN, dyslipidemia,  fibromyalgia who has been referred for persistent leukocytosis.  Amber Rose is here for follow-up.  Since her last visit here, she says she felt better after taking the B12 injections.  She however is dealing with menstruation which has been ongoing for a whole month and also some diabetes related issues.  She is currently on Ozempic but does not feel like its effective anymore.  Patient reports hard lump in her rectal area which she believes may be from a hemorrhoid or a blood clot.  She also reports some tenderness and discomfort in her lower abdomen when coughing or stretching.  Rest of the pertinent 10 point ROS reviewed and negative.  MEDICAL HISTORY:  Past Medical History:  Diagnosis Date   Allergy    Anxiety    Aortic atherosclerosis (HCC)    Arthritis    Chronic UTI    Clostridium difficile infection    Depression    Diabetes mellitus without complication (HCC)    Diverticulosis    Fibromyalgia    GERD (gastroesophageal reflux disease)    Hiatal hernia    Hyperlipidemia    Hypertension    Internal hemorrhoids    Meningitis, viral    Pancreatitis    Pure hypercholesterolemia 12/23/2020   Tubular adenoma of colon     SURGICAL HISTORY: Past Surgical History:  Procedure Laterality Date   APPENDECTOMY  1990   CESAREAN SECTION  2002   CHOLECYSTECTOMY  2011   COLONOSCOPY     UPPER GASTROINTESTINAL ENDOSCOPY     URINARY SURGERY     urethra sling, removal, and then revision 2016 (4 surgeries)   WISDOM TOOTH EXTRACTION      SOCIAL HISTORY: Social History   Socioeconomic History  Marital status: Single    Spouse name: Not on file   Number of children: 3   Years of education: Not on file   Highest education level: Not on file  Occupational History   Not on file  Tobacco Use   Smoking status: Every Day    Current packs/day: 0.10    Average packs/day: 0.1 packs/day for 20.0 years (2.0 ttl pk-yrs)    Types: Cigarettes   Smokeless tobacco: Never   Tobacco  comments:    Patient is engaged in health coaching for smoking cessation as of 12/26/20  Vaping Use   Vaping status: Never Used  Substance and Sexual Activity   Alcohol use: Yes    Comment: occ   Drug use: Not Currently    Types: Marijuana   Sexual activity: Not Currently    Birth control/protection: Surgical    Comment: pt on her mentral cycle now began 11-14-17; BTL  Other Topics Concern   Not on file  Social History Narrative   Lives home with adult niece.  Not working.  Disability pending.  Pt is single.  Education GED.  3 children.    Social Determinants of Health   Financial Resource Strain: Low Risk  (01/25/2023)   Overall Financial Resource Strain (CARDIA)    Difficulty of Paying Living Expenses: Not hard at all  Food Insecurity: Food Insecurity Present (01/25/2023)   Hunger Vital Sign    Worried About Running Out of Food in the Last Year: Sometimes true    Ran Out of Food in the Last Year: Sometimes true  Transportation Needs: No Transportation Needs (01/25/2023)   PRAPARE - Administrator, Civil Service (Medical): No    Lack of Transportation (Non-Medical): No  Physical Activity: Inactive (01/25/2023)   Exercise Vital Sign    Days of Exercise per Week: 0 days    Minutes of Exercise per Session: 0 min  Stress: Stress Concern Present (01/25/2023)   [employer] of Occupational Health - Occupational Stress Questionnaire    Feeling of Stress : To some extent  Social Connections: Not on file  Intimate Partner Violence: Not At Risk (03/06/2023)   Humiliation, Afraid, Rape, and Kick questionnaire    Fear of Current or Ex-Partner: No    Emotionally Abused: No    Physically Abused: No    Sexually Abused: No    FAMILY HISTORY: Family History  Problem Relation Age of Onset   Cancer Mother        cervical, breast, lungm skin, bladder-ex smoker   Hypertension Mother    Diabetes Father    Heart attack Maternal Grandfather    Multiple sclerosis Paternal Grandmother     Allergic rhinitis Neg Hx    Angioedema Neg Hx    Asthma Neg Hx    Eczema Neg Hx    Urticaria Neg Hx    Colon cancer Neg Hx    Esophageal cancer Neg Hx    Liver disease Neg Hx    Pancreatic cancer Neg Hx    Prostate cancer Neg Hx    Rectal cancer Neg Hx    Stomach cancer Neg Hx    Colon polyps Neg Hx     ALLERGIES:  is allergic to amlodipine, atorvastatin, crestor [rosuvastatin], irbesartan, metformin and related, metronidazole, penicillins, xanax [alprazolam], glyburide, linagliptin, and moxifloxacin.  MEDICATIONS:  Current Outpatient Medications  Medication Sig Dispense Refill   acyclovir (ZOVIRAX) 400 MG tablet TAKE 1 TABLET BY MOUTH TWICE  DAILY 180 tablet 1  albuterol (PROAIR HFA) 108 (90 Base) MCG/ACT inhaler Inhale 2 puffs into the lungs every 4 (four) hours as needed for wheezing or shortness of breath. 3 Inhaler 0   Blood Glucose Monitoring Suppl (ACCU-CHEK AVIVA PLUS) w/Device KIT USE AS DIRECTED DAILY. E11.9 1 kit 0   Continuous Blood Gluc Receiver (FREESTYLE LIBRE 2 READER) DEVI Use to check blood sugar three times daily. E11.49 1 each 0   Continuous Blood Gluc Sensor (FREESTYLE LIBRE 2 SENSOR) MISC Use to check blood sugar three times daily. Change sensors once every 14 days. E11.49 2 each 3   diazepam (VALIUM) 10 MG tablet Take 1 tablet one hour before procedure, repeat x 1 if needed. 2 tablet 0   fluconazole (DIFLUCAN) 150 MG tablet Take 1 and repeat in 3 days 2 tablet 0   glucose blood (FREESTYLE TEST STRIPS) test strip Use to check blood sugar three times daily. E11.49 100 each 3   hydrOXYzine (ATARAX) 25 MG tablet Take 25 mg by mouth 2 (two) times daily.     ibuprofen (ADVIL) 800 MG tablet TAKE 1 TABLET BY MOUTH EVERY 12  HOURS AS NEEDED 60 tablet 0   Lancet Devices (ACCU-CHEK SOFTCLIX) lancets Use as instructed daily. 1 each 5   Lancets (FREESTYLE) lancets Use to check blood sugar three times daily. E11.49 100 each 3   methocarbamol (ROBAXIN) 750 MG tablet Take 1  tablet (750 mg total) by mouth every 8 (eight) hours as needed for muscle spasms. 90 tablet 3   Multiple Vitamin (MULTI-VITAMIN DAILY) TABS Take 1 tablet by mouth daily as needed.     Naltrexone-buPROPion HCl ER 8-90 MG TB12 Start 1 tablet every morning for 7 days, then 1 tablet twice daily for 7 days, then 2 tablets every morning and one in the evening then 2 tabs twice daily going forward 120 tablet 1   nitrofurantoin, macrocrystal-monohydrate, (MACROBID) 100 MG capsule Take 1 capsule (100 mg total) by mouth 2 (two) times daily. 14 capsule 0   omeprazole (PRILOSEC) 40 MG capsule TAKE 1 CAPSULE BY MOUTH EVERY  MORNING 100 capsule 0   pregabalin (LYRICA) 100 MG capsule TAKE 1 CAPSULE BY MOUTH TWICE  DAILY 60 capsule 5   progesterone (PROMETRIUM) 100 MG capsule Take 1 capsule (100 mg total) by mouth 2 (two) times daily. 60 capsule 2   saccharomyces boulardii (FLORASTOR) 250 MG capsule Take 1 capsule (250 mg total) by mouth 2 (two) times daily. 60 capsule 0   Semaglutide, 2 MG/DOSE, (OZEMPIC, 2 MG/DOSE,) 8 MG/3ML SOPN INJECT SUBCUTANEOUSLY 2 MG EVERY WEEK AS DIRECTED 3 mL 0   spironolactone (ALDACTONE) 25 MG tablet Take 0.5 tablets (12.5 mg total) by mouth daily. 15 tablet 2   terconazole (TERAZOL 7) 0.4 % vaginal cream Place 1 applicator vaginally at bedtime. 45 g 0   No current facility-administered medications for this visit.     PHYSICAL EXAMINATION: ECOG PERFORMANCE STATUS: 0 - Asymptomatic  Vitals:   08/13/23 0935  BP: (!) 169/86  Pulse: 69  Resp: 18  Temp: 98.1 F (36.7 C)  SpO2: 99%    Filed Weights   08/13/23 0935  Weight: 169 lb (76.7 kg)     GENERAL:alert, no distress and comfortable SKIN: skin color, texture, turgor are normal, no rashes or significant lesions EYES: normal, conjunctiva are pink and non-injected, sclera clear OROPHARYNX:no exudate, no erythema and lips, buccal mucosa, and tongue normal  NECK: supple, thyroid normal size, non-tender, without  nodularity LYMPH:  no palpable lymphadenopathy in  the cervical, axillary LUNGS: clear to auscultation and percussion with normal breathing effort HEART: regular rate & rhythm and no murmurs and no lower extremity edema ABDOMEN:abdomen soft, non-tender and normal bowel sounds Musculoskeletal:no cyanosis of digits and no clubbing  PSYCH: alert & oriented x 3 with fluent speech NEURO: no focal motor/sensory deficits  LABORATORY DATA:  I have reviewed the data as listed Lab Results  Component Value Date   WBC 11.0 (H) 08/13/2023   HGB 14.9 08/13/2023   HCT 43.2 08/13/2023   MCV 91.1 08/13/2023   PLT 287 08/13/2023     Chemistry      Component Value Date/Time   NA 140 03/08/2023 1316   NA 140 02/17/2023 1009   K 3.9 03/08/2023 1316   CL 106 03/08/2023 1316   CO2 28 03/08/2023 1316   BUN 11 03/08/2023 1316   BUN 15 02/17/2023 1009   CREATININE 0.63 03/08/2023 1316   CREATININE 0.64 10/28/2016 0851      Component Value Date/Time   CALCIUM 8.7 (L) 03/08/2023 1316   ALKPHOS 63 03/08/2023 1316   AST 9 (L) 03/08/2023 1316   ALT 7 03/08/2023 1316   BILITOT 0.4 03/08/2023 1316       RADIOGRAPHIC STUDIES: I have personally reviewed the radiological images as listed and agreed with the findings in the report. No results found.  All questions were answered. The patient knows to call the clinic with any problems, questions or concerns. I spent 20 minutes in the care of this patient including H and P, review of records, counseling and coordination of care.     Rachel Moulds, MD 08/13/2023 9:38 AM

## 2023-08-13 NOTE — Telephone Encounter (Signed)
Please review and advise, continue with whole tablet or trial 1/2 tablet?

## 2023-08-14 LAB — URINE CULTURE

## 2023-08-16 ENCOUNTER — Other Ambulatory Visit: Payer: Self-pay

## 2023-08-16 ENCOUNTER — Ambulatory Visit: Payer: 59 | Admitting: Family Medicine

## 2023-08-16 ENCOUNTER — Telehealth: Payer: Self-pay

## 2023-08-16 ENCOUNTER — Encounter: Payer: Self-pay | Admitting: Adult Health

## 2023-08-16 ENCOUNTER — Other Ambulatory Visit (HOSPITAL_COMMUNITY): Payer: Self-pay

## 2023-08-16 ENCOUNTER — Other Ambulatory Visit (HOSPITAL_BASED_OUTPATIENT_CLINIC_OR_DEPARTMENT_OTHER): Payer: Self-pay | Admitting: *Deleted

## 2023-08-16 ENCOUNTER — Other Ambulatory Visit: Payer: Self-pay | Admitting: *Deleted

## 2023-08-16 DIAGNOSIS — D500 Iron deficiency anemia secondary to blood loss (chronic): Secondary | ICD-10-CM

## 2023-08-16 MED ORDER — METFORMIN HCL 500 MG PO TABS
500.0000 mg | ORAL_TABLET | Freq: Two times a day (BID) | ORAL | 1 refills | Status: DC
  Filled 2023-08-16 (×3): qty 180, 90d supply, fill #0

## 2023-08-16 MED ORDER — NITROFURANTOIN MONOHYD MACRO 100 MG PO CAPS: 100.0000 mg | ORAL_CAPSULE | Freq: Two times a day (BID) | ORAL | 0 refills | Status: DC

## 2023-08-16 MED ORDER — METFORMIN HCL 500 MG PO TABS: 500.0000 mg | ORAL_TABLET | Freq: Two times a day (BID) | ORAL | 1 refills | Status: DC

## 2023-08-16 NOTE — Telephone Encounter (Signed)
This RN called pt to inform her that we will see her in 2 weeks for follow-up labs with appt dates/time. Pt verbalized understanding.   Pt states that she continues to experience blood loss and wanted to know if Dr. Al Pimple wants her to continue iron supplements. This RN sent a message to Dr. Al Pimple to confirm.

## 2023-08-16 NOTE — Telephone Encounter (Signed)
This RN called pt to inform her of her B12 results. Per Dr. Al Pimple, pt informed that her B12 levels are too high and to please discontinue taking her B12.  Pt states that she has only been taking her B12 for appx 5 days and is concerned that her levels are as high as they are after only taking B12 for such a short period of time. This RN informed pt that she would forward message to Dr. Al Pimple to make her aware.

## 2023-08-18 ENCOUNTER — Other Ambulatory Visit (HOSPITAL_BASED_OUTPATIENT_CLINIC_OR_DEPARTMENT_OTHER): Payer: 59 | Admitting: Pharmacist

## 2023-08-18 DIAGNOSIS — G729 Myopathy, unspecified: Secondary | ICD-10-CM

## 2023-08-18 DIAGNOSIS — G720 Drug-induced myopathy: Secondary | ICD-10-CM | POA: Diagnosis not present

## 2023-08-18 NOTE — Progress Notes (Signed)
Pharmacy Quality Measure Review  This patient is appearing on report for being at risk of failing the measure for Statin Use in Persons with Diabetes (SUPD) medications this calendar year.   Prior trials of: atorvastatin, rosuvastatin, Praluent, Repatha   Message sent to PCP to add exclusion code at upcoming visit. If appropriate, please consider adding one of the following:    Acceptable exclusion codes:  Please consider ONE of the following to list as a dx:  Drug Induced Myopathy G72.0  Myopathy, unspecified G72.9     Butch Penny, PharmD, [place], CPP Clinical Pharmacist [facility] [phone]

## 2023-08-23 ENCOUNTER — Encounter: Payer: Self-pay | Admitting: Internal Medicine

## 2023-08-25 ENCOUNTER — Encounter: Payer: Self-pay | Admitting: Family Medicine

## 2023-08-25 ENCOUNTER — Telehealth: Payer: Self-pay | Admitting: Licensed Clinical Social Worker

## 2023-08-25 ENCOUNTER — Ambulatory Visit: Payer: 59 | Attending: Family Medicine | Admitting: Family Medicine

## 2023-08-25 VITALS — BP 178/93 | HR 65 | Wt 167.8 lb

## 2023-08-25 DIAGNOSIS — I152 Hypertension secondary to endocrine disorders: Secondary | ICD-10-CM

## 2023-08-25 DIAGNOSIS — F419 Anxiety disorder, unspecified: Secondary | ICD-10-CM | POA: Diagnosis not present

## 2023-08-25 DIAGNOSIS — Z7985 Long-term (current) use of injectable non-insulin antidiabetic drugs: Secondary | ICD-10-CM | POA: Diagnosis not present

## 2023-08-25 DIAGNOSIS — E1159 Type 2 diabetes mellitus with other circulatory complications: Secondary | ICD-10-CM | POA: Diagnosis not present

## 2023-08-25 DIAGNOSIS — E1149 Type 2 diabetes mellitus with other diabetic neurological complication: Secondary | ICD-10-CM

## 2023-08-25 DIAGNOSIS — G720 Drug-induced myopathy: Secondary | ICD-10-CM | POA: Diagnosis not present

## 2023-08-25 DIAGNOSIS — T466X5A Adverse effect of antihyperlipidemic and antiarteriosclerotic drugs, initial encounter: Secondary | ICD-10-CM | POA: Diagnosis not present

## 2023-08-25 DIAGNOSIS — F17210 Nicotine dependence, cigarettes, uncomplicated: Secondary | ICD-10-CM

## 2023-08-25 DIAGNOSIS — Z794 Long term (current) use of insulin: Secondary | ICD-10-CM

## 2023-08-25 DIAGNOSIS — M79641 Pain in right hand: Secondary | ICD-10-CM

## 2023-08-25 DIAGNOSIS — F17200 Nicotine dependence, unspecified, uncomplicated: Secondary | ICD-10-CM

## 2023-08-25 DIAGNOSIS — N939 Abnormal uterine and vaginal bleeding, unspecified: Secondary | ICD-10-CM

## 2023-08-25 DIAGNOSIS — Z7984 Long term (current) use of oral hypoglycemic drugs: Secondary | ICD-10-CM

## 2023-08-25 DIAGNOSIS — F32A Depression, unspecified: Secondary | ICD-10-CM

## 2023-08-25 LAB — GLUCOSE, POCT (MANUAL RESULT ENTRY): POC Glucose: 262 mg/dL — AB (ref 70–99)

## 2023-08-25 MED ORDER — INSULIN PEN NEEDLE 31G X 5 MM MISC: 1.0000 | Freq: Every day | 5 refills | Status: AC

## 2023-08-25 MED ORDER — BUPROPION HCL ER (XL) 150 MG PO TB24: 150.0000 mg | ORAL_TABLET | Freq: Every day | ORAL | 1 refills | Status: DC

## 2023-08-25 MED ORDER — LANTUS SOLOSTAR 100 UNIT/ML ~~LOC~~ SOPN: 10.0000 [IU] | PEN_INJECTOR | Freq: Every day | SUBCUTANEOUS | 6 refills | Status: DC

## 2023-08-25 NOTE — Progress Notes (Signed)
Subjective:  Patient ID: Amber Rose, female    DOB: 11/01/1975  Age: 47 y.o. MRN: [id number]  CC: Medical Management of Chronic Issues   HPI Amber Rose is a 47 y.o. year old female with a history of  type 2 diabetes mellitus (A1c 9.2), hypertension, depression and anxiety, hydradenitis, tobacco abuse, fibromyalgia seen for a follow up visit.   Interval History: Discussed the use of AI scribe software for clinical note transcription with the patient, who gave verbal consent to proceed.  She presents with concerns about elevated protein in her urine and poor glycemic control, as evidenced by an increase in A1c from 6 to 9.2. She reports that she has been experiencing gastrointestinal issues and has a suspected 'gut infection', which has led her to reduce her intake of Ozempic from once every week to once every 2 weeks.  She had previously requested increasing Ozempic dose to maximum dose and subsequently sent MyChart messages regarding nausea and severe loss of appetite and we decreased Ozempic to the minimal dose only for her to request the dose be escalated back so she could achieve desired weight loss.    She expresses concern about her blood sugar levels, which have been as high as 318.  She had also requested a prescription for metformin to combat this high sugars.  I had referred her to endocrine 6 months ago but she is yet to be seen.  She also mentions that she has been experiencing cravings for sweet foods and drinks, which she believes may be due to a medication she is taking for birth control, norethindrone. 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  Cardiology note indicates she also declined Repatha due to side effects. Her blood pressure is elevated and spironolactone was added at her last visit with the nurse practitioner at the advanced hypertension clinic.  She endorses adherence with her antihypertensive. The patient also reports significant stress and  depression related to her current living situation. She lives with a roommate and there have been ongoing conflicts, including issues with shared space and food in the refrigerator. This situation has been causing the patient significant distress and she believes it is contributing to her poor health outcomes.        Past Medical History:  Diagnosis Date   Allergy    Anxiety    Aortic atherosclerosis (HCC)    Arthritis    Chronic UTI    Clostridium difficile infection    Depression    Diabetes mellitus without complication (HCC)    Diverticulosis    Fibromyalgia    GERD (gastroesophageal reflux disease)    Hiatal hernia    Hyperlipidemia    Hypertension    Internal hemorrhoids    Meningitis, viral    Pancreatitis    Pure hypercholesterolemia 12/23/2020   Tubular adenoma of colon     Past Surgical History:  Procedure Laterality Date   APPENDECTOMY  1990   CESAREAN SECTION  2002   CHOLECYSTECTOMY  2011   COLONOSCOPY     UPPER GASTROINTESTINAL ENDOSCOPY     URINARY SURGERY     urethra sling, removal, and then revision 2016 (4 surgeries)   WISDOM TOOTH EXTRACTION      Family History  Problem Relation Age of Onset   Cancer Mother        cervical, breast, lungm skin, bladder-ex smoker   Hypertension Mother    Diabetes Father    Heart attack Maternal Grandfather    Multiple  sclerosis Paternal Grandmother    Allergic rhinitis Neg Hx    Angioedema Neg Hx    Asthma Neg Hx    Eczema Neg Hx    Urticaria Neg Hx    Colon cancer Neg Hx    Esophageal cancer Neg Hx    Liver disease Neg Hx    Pancreatic cancer Neg Hx    Prostate cancer Neg Hx    Rectal cancer Neg Hx    Stomach cancer Neg Hx    Colon polyps Neg Hx     Social History   Socioeconomic History   Marital status: Single    Spouse name: Not on file   Number of children: 3   Years of education: Not on file   Highest education level: Not on file  Occupational History   Not on file  Tobacco Use   Smoking  status: Every Day    Current packs/day: 0.10    Average packs/day: 0.1 packs/day for 20.0 years (2.0 ttl pk-yrs)    Types: Cigarettes   Smokeless tobacco: Never   Tobacco comments:    Patient is engaged in health coaching for smoking cessation as of 12/26/20  Vaping Use   Vaping status: Never Used  Substance and Sexual Activity   Alcohol use: Yes    Comment: occ   Drug use: Not Currently    Types: Marijuana   Sexual activity: Not Currently    Birth control/protection: Surgical    Comment: pt on her mentral cycle now began 11-14-17; BTL  Other Topics Concern   Not on file  Social History Narrative   Lives home with adult niece.  Not working.  Disability pending.  Pt is single.  Education GED.  3 children.    Social Determinants of Health   Financial Resource Strain: Low Risk  (01/25/2023)   Overall Financial Resource Strain (CARDIA)    Difficulty of Paying Living Expenses: Not hard at all  Food Insecurity: Food Insecurity Present (01/25/2023)   Hunger Vital Sign    Worried About Running Out of Food in the Last Year: Sometimes true    Ran Out of Food in the Last Year: Sometimes true  Transportation Needs: No Transportation Needs (01/25/2023)   PRAPARE - Administrator, Civil Service (Medical): No    Lack of Transportation (Non-Medical): No  Physical Activity: Inactive (01/25/2023)   Exercise Vital Sign    Days of Exercise per Week: 0 days    Minutes of Exercise per Session: 0 min  Stress: Stress Concern Present (01/25/2023)   [employer] of Occupational Health - Occupational Stress Questionnaire    Feeling of Stress : To some extent  Social Connections: Not on file    Allergies  Allergen Reactions   Amlodipine     Leg pain, swelling around eyes    Atorvastatin Other (See Comments)     Joint pain   Crestor [Rosuvastatin]     Joint pain    Irbesartan     Leg pain, swelling around eyes   Metformin And Related Nausea And Vomiting   Metronidazole Nausea And  Vomiting   Penicillins     childhood   Xanax [Alprazolam]     "Caused upper respiratory symptoms" per pt   Glyburide Other (See Comments)    "blood sugar dropped uncontrollably"   Linagliptin Diarrhea and Other (See Comments)    Stomach pain, sinus infection   Moxifloxacin Diarrhea    Outpatient Medications Prior to Visit  Medication Sig Dispense Refill  acyclovir (ZOVIRAX) 400 MG tablet TAKE 1 TABLET BY MOUTH TWICE  DAILY 180 tablet 1   albuterol (PROAIR HFA) 108 (90 Base) MCG/ACT inhaler Inhale 2 puffs into the lungs every 4 (four) hours as needed for wheezing or shortness of breath. 3 Inhaler 0   Blood Glucose Monitoring Suppl (ACCU-CHEK AVIVA PLUS) w/Device KIT USE AS DIRECTED DAILY. E11.9 1 kit 0   Continuous Blood Gluc Receiver (FREESTYLE LIBRE 2 READER) DEVI Use to check blood sugar three times daily. E11.49 1 each 0   Continuous Blood Gluc Sensor (FREESTYLE LIBRE 2 SENSOR) MISC Use to check blood sugar three times daily. Change sensors once every 14 days. E11.49 2 each 3   diazepam (VALIUM) 10 MG tablet Take 1 tablet one hour before procedure, repeat x 1 if needed. 2 tablet 0   fluconazole (DIFLUCAN) 150 MG tablet Take 1 and repeat in 3 days 2 tablet 0   glucose blood (FREESTYLE TEST STRIPS) test strip Use to check blood sugar three times daily. E11.49 100 each 3   hydrOXYzine (ATARAX) 25 MG tablet Take 25 mg by mouth 2 (two) times daily.     ibuprofen (ADVIL) 800 MG tablet TAKE 1 TABLET BY MOUTH EVERY 12  HOURS AS NEEDED 60 tablet 0   Lancet Devices (ACCU-CHEK SOFTCLIX) lancets Use as instructed daily. 1 each 5   Lancets (FREESTYLE) lancets Use to check blood sugar three times daily. E11.49 100 each 3   metFORMIN (GLUCOPHAGE) 500 MG tablet Take 1 tablet (500 mg total) by mouth 2 (two) times daily with a meal. 180 tablet 1   methocarbamol (ROBAXIN) 750 MG tablet Take 1 tablet (750 mg total) by mouth every 8 (eight) hours as needed for muscle spasms. 90 tablet 3   Multiple Vitamin  (MULTI-VITAMIN DAILY) TABS Take 1 tablet by mouth daily as needed.     nitrofurantoin, macrocrystal-monohydrate, (MACROBID) 100 MG capsule Take 1 capsule (100 mg total) by mouth 2 (two) times daily. 14 capsule 0   omeprazole (PRILOSEC) 40 MG capsule TAKE 1 CAPSULE BY MOUTH EVERY  MORNING 100 capsule 0   pregabalin (LYRICA) 100 MG capsule TAKE 1 CAPSULE BY MOUTH TWICE  DAILY 60 capsule 5   progesterone (PROMETRIUM) 100 MG capsule Take 1 capsule (100 mg total) by mouth 2 (two) times daily. 60 capsule 2   saccharomyces boulardii (FLORASTOR) 250 MG capsule Take 1 capsule (250 mg total) by mouth 2 (two) times daily. 60 capsule 0   Semaglutide, 2 MG/DOSE, (OZEMPIC, 2 MG/DOSE,) 8 MG/3ML SOPN INJECT SUBCUTANEOUSLY 2 MG EVERY WEEK AS DIRECTED 3 mL 0   spironolactone (ALDACTONE) 25 MG tablet Take 1 tablet (25 mg total) by mouth daily. 90 tablet 3   terconazole (TERAZOL 7) 0.4 % vaginal cream Place 1 applicator vaginally at bedtime. 45 g 0   Naltrexone-buPROPion HCl ER 8-90 MG TB12 Start 1 tablet every morning for 7 days, then 1 tablet twice daily for 7 days, then 2 tablets every morning and one in the evening then 2 tabs twice daily going forward 120 tablet 1   No facility-administered medications prior to visit.     ROS Review of Systems  Constitutional:  Negative for activity change and appetite change.  HENT:  Negative for sinus pressure and sore throat.   Respiratory:  Negative for chest tightness, shortness of breath and wheezing.   Cardiovascular:  Negative for chest pain and palpitations.  Gastrointestinal:  Negative for abdominal distention, abdominal pain and constipation.  Genitourinary: Negative.  Musculoskeletal:  Positive for arthralgias.  Psychiatric/Behavioral:  Positive for dysphoric mood. Negative for behavioral problems.     Objective:  BP (!) 178/93 (BP Location: Left Arm, Patient Position: Sitting, Cuff Size: Normal)   Pulse 65   Wt 167 lb 12.8 oz (76.1 kg)   SpO2 100%    BMI 29.72 kg/m      08/25/2023    9:55 AM 08/13/2023    9:35 AM 08/11/2023   10:51 AM  BP/Weight  Systolic BP 178 169 176  Diastolic BP 93 86 103  Wt. (Lbs) 167.8 169   BMI 29.72 kg/m2 29.94 kg/m2       Physical Exam Constitutional:      Appearance: She is well-developed.  Cardiovascular:     Rate and Rhythm: Normal rate.     Heart sounds: Normal heart sounds. No murmur heard. Pulmonary:     Effort: Pulmonary effort is normal.     Breath sounds: Normal breath sounds. No wheezing or rales.  Chest:     Chest wall: No tenderness.  Abdominal:     General: Bowel sounds are normal. There is no distension.     Palpations: Abdomen is soft. There is no mass.     Tenderness: There is no abdominal tenderness.  Musculoskeletal:        General: Normal range of motion.     Right lower leg: No edema.     Left lower leg: No edema.  Neurological:     Mental Status: She is alert and oriented to person, place, and time.  Psychiatric:        Mood and Affect: Mood normal.        Latest Ref Rng & Units 08/13/2023    9:21 AM 03/08/2023    1:16 PM 02/17/2023   10:09 AM  CMP  Glucose 70 - 99 mg/dL 147  829  97   BUN 6 - 20 mg/dL 21  11  15    Creatinine 0.44 - 1.00 mg/dL 5.62  1.30  8.65   Sodium 135 - 145 mmol/L 135  140  140   Potassium 3.5 - 5.1 mmol/L 4.9  3.9  5.1   Chloride 98 - 111 mmol/L 101  106  105   CO2 22 - 32 mmol/L 27  28  18    Calcium 8.9 - 10.3 mg/dL 9.5  8.7  9.4   Total Protein 6.5 - 8.1 g/dL  6.7    Total Bilirubin 0.3 - 1.2 mg/dL  0.4    Alkaline Phos 38 - 126 U/L  63    AST 15 - 41 U/L  9    ALT 0 - 44 U/L  7      Lipid Panel     Component Value Date/Time   CHOL 213 (H) 12/01/2022 0955   TRIG 105 12/01/2022 0955   HDL 43 12/01/2022 0955   CHOLHDL 5.0 (H) 12/01/2022 0955   CHOLHDL 4.4 10/28/2016 0851   LDLCALC 151 (H) 12/01/2022 0955   LDLDIRECT 51 08/14/2022 1431    CBC    Component Value Date/Time   WBC 11.0 (H) 08/13/2023 0921   RBC 4.74  08/13/2023 0921   HGB 14.9 08/13/2023 0921   HGB 13.9 06/02/2023 1117   HGB 10.8 (L) 02/05/2023 1147   HCT 43.2 08/13/2023 0921   HCT 33.9 (L) 02/05/2023 1147   PLT 287 08/13/2023 0921   PLT 279 06/02/2023 1117   PLT 312 02/05/2023 1147   MCV 91.1 08/13/2023 0921  MCV 89 02/05/2023 1147   MCH 31.4 08/13/2023 0921   MCHC 34.5 08/13/2023 0921   RDW 13.5 08/13/2023 0921   RDW 14.8 02/05/2023 1147   LYMPHSABS 2.2 08/13/2023 0921   LYMPHSABS 2.0 02/05/2023 1147   MONOABS 0.6 08/13/2023 0921   EOSABS 0.1 08/13/2023 0921   EOSABS 0.0 02/05/2023 1147   BASOSABS 0.1 08/13/2023 0921   BASOSABS 0.0 02/05/2023 1147    Lab Results  Component Value Date   HGBA1C 9.2 (H) 08/04/2023    Assessment & Plan:      Type 2 Diabetes Mellitus A1c increased from 6 to 9.2. Patient reports discontinuing Ozempic due to gastrointestinal side effects and appetite suppression. Patient also reports increased sugar cravings possibly related to norethindrone use. -Continue Ozempic 2mg .  She prefers to take this every 2 weeks due to improved tolerance -Initiate Lantus 10 units at bedtime, and if hypoglycemia occurs decrease by 2 units. -Check microalbuminuria due to elevated A1c and reported proteinuria. -Refer to endocrinology for further management.  Hypertension Uncontrolled Blood pressure remains elevated despite recent initiation of spironolactone. -Increase spironolactone to 25mg  daily. -Check potassium levels on 08/09/2023 due to increased spironolactone dose. -Counseled on blood pressure goal of less than 130/80, low-sodium, DASH diet, medication compliance, 150 minutes of moderate intensity exercise per week. Discussed medication compliance, adverse effects.   Anxiety and Depression Patient reports increased stress and depressive symptoms, not currently on any antidepressant medication. -Initiate Wellbutrin for depression and smoking cessation. -Refer to in-clinic LCSW therapist for further  evaluation and management.   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 -Continue follow-up with Gynecology for ongoing management.   Statin myopathy Uncontrolled Unable to tolerate statin Declined Repatha per cardiology notes due to side effects. Low-cholesterol diet  Meds ordered this encounter  Medications   insulin glargine (LANTUS SOLOSTAR) 100 UNIT/ML Solostar Pen    Sig: Inject 10 Units into the skin daily.    Dispense:  15 mL    Refill:  6   buPROPion (WELLBUTRIN XL) 150 MG 24 hr tablet    Sig: Take 1 tablet (150 mg total) by mouth daily.    Dispense:  90 tablet    Refill:  1   Insulin Pen Needle 31G X 5 MM MISC    Sig: 1 each by Does not apply route at bedtime.    Dispense:  30 each    Refill:  5    Follow-up: Return in about 3 months (around 11/25/2023) for Chronic medical conditions.       Hoy Register, MD, FAAFP. [facility] [place], [place] [phone]   08/25/2023, 10:39 AM

## 2023-08-25 NOTE — Patient Instructions (Signed)
Please call endocrinology for your appointment: [facility] - [employer] [phone] [address] [place], [place] [address] [phone]

## 2023-08-25 NOTE — Telephone Encounter (Signed)
Met with Ms Amber Rose during a warm hand off from Dr. Alvis Lemmings concerning stressful living situation. Pt is wanting legal advice on how to get her roommate out of her home and is looking for medication management. Pt shared that she does not have the time to do therapy with LCSWA, but would like resources.

## 2023-08-25 NOTE — Progress Notes (Unsigned)
Chief Complaint: Primary GI MD: Dr. Rhea Belton  HPI: 47 year old female history of probable gastroparesis, GERD, esophageal dysphagia responsive to prior dilation, C. difficile colitis, hypertension, hyperlipidemia, diabetes, presents for evaluation of  Last seen February 2024 by Dr. Rhea Belton. Patient has history of C. Difficile treated with vancomycin.  PCR toxin positivity 02/24/2022 with fecal leukocyte negative and fecal elastase normal.  C. difficile negative 02/2022, toxin positive 05/13/2022, toxin negative 06/11/2022, toxin positive 07/07/2022.  Patient was given Dificid therapy September 2024 and then seen by ID October 2024 and was likely colonized.  During last office visit she complained of gas/flatulence/borborygmi/crampy abdominal pain and she had reassuring CT last year and up-to-date on colonoscopy.  Recommended to monitor now and possible consideration of rifaximin.      PREVIOUS GI WORKUP   Colonoscopy 07/2019 for change in bowel habits - The examined portion of the ileum was normal.  - One 4 mm polyp  (tubular adenoma) in the ascending colon, removed with a cold snare. Resected and retrieved.  - The examination was otherwise normal on direct and retroflexion views. - Repeat October 2027  EGD 07/2019 for epigastric pain, dysphagia, nausea - Normal esophagus. Esophagus dilated with 54 Fr Maloney.  - Normal stomach. Biopsied.  (Negative H. pylori) - Acute duodenitis.  - Normal second portion of the duodenum.  CT abdomen pelvis with contrast 03/02/2022 for LLQ pain: Moderate stool throughout the colon.  Liver unremarkable.  S/p cholecystectomy.  Otherwise normal.  Past Medical History:  Diagnosis Date   Allergy    Anxiety    Aortic atherosclerosis (HCC)    Arthritis    Chronic UTI    Clostridium difficile infection    Depression    Diabetes mellitus without complication (HCC)    Diverticulosis    Fibromyalgia    GERD (gastroesophageal reflux disease)    Hiatal hernia     Hyperlipidemia    Hypertension    Internal hemorrhoids    Meningitis, viral    Pancreatitis    Pure hypercholesterolemia 12/23/2020   Tubular adenoma of colon     Past Surgical History:  Procedure Laterality Date   APPENDECTOMY  1990   CESAREAN SECTION  2002   CHOLECYSTECTOMY  2011   COLONOSCOPY     UPPER GASTROINTESTINAL ENDOSCOPY     URINARY SURGERY     urethra sling, removal, and then revision 2016 (4 surgeries)   WISDOM TOOTH EXTRACTION      Current Outpatient Medications  Medication Sig Dispense Refill   acyclovir (ZOVIRAX) 400 MG tablet TAKE 1 TABLET BY MOUTH TWICE  DAILY 180 tablet 1   albuterol (PROAIR HFA) 108 (90 Base) MCG/ACT inhaler Inhale 2 puffs into the lungs every 4 (four) hours as needed for wheezing or shortness of breath. 3 Inhaler 0   Blood Glucose Monitoring Suppl (ACCU-CHEK AVIVA PLUS) w/Device KIT USE AS DIRECTED DAILY. E11.9 1 kit 0   buPROPion (WELLBUTRIN XL) 150 MG 24 hr tablet Take 1 tablet (150 mg total) by mouth daily. 90 tablet 1   Continuous Blood Gluc Receiver (FREESTYLE LIBRE 2 READER) DEVI Use to check blood sugar three times daily. E11.49 1 each 0   Continuous Blood Gluc Sensor (FREESTYLE LIBRE 2 SENSOR) MISC Use to check blood sugar three times daily. Change sensors once every 14 days. E11.49 2 each 3   diazepam (VALIUM) 10 MG tablet Take 1 tablet one hour before procedure, repeat x 1 if needed. 2 tablet 0   fluconazole (DIFLUCAN) 150 MG tablet Take  1 and repeat in 3 days 2 tablet 0   glucose blood (FREESTYLE TEST STRIPS) test strip Use to check blood sugar three times daily. E11.49 100 each 3   hydrOXYzine (ATARAX) 25 MG tablet Take 25 mg by mouth 2 (two) times daily.     ibuprofen (ADVIL) 800 MG tablet TAKE 1 TABLET BY MOUTH EVERY 12  HOURS AS NEEDED 60 tablet 0   insulin glargine (LANTUS SOLOSTAR) 100 UNIT/ML Solostar Pen Inject 10 Units into the skin daily. 15 mL 6   Insulin Pen Needle 31G X 5 MM MISC 1 each by Does not apply route at  bedtime. 30 each 5   Lancet Devices (ACCU-CHEK SOFTCLIX) lancets Use as instructed daily. 1 each 5   Lancets (FREESTYLE) lancets Use to check blood sugar three times daily. E11.49 100 each 3   metFORMIN (GLUCOPHAGE) 500 MG tablet Take 1 tablet (500 mg total) by mouth 2 (two) times daily with a meal. 180 tablet 1   methocarbamol (ROBAXIN) 750 MG tablet Take 1 tablet (750 mg total) by mouth every 8 (eight) hours as needed for muscle spasms. 90 tablet 3   Multiple Vitamin (MULTI-VITAMIN DAILY) TABS Take 1 tablet by mouth daily as needed.     nitrofurantoin, macrocrystal-monohydrate, (MACROBID) 100 MG capsule Take 1 capsule (100 mg total) by mouth 2 (two) times daily. 14 capsule 0   omeprazole (PRILOSEC) 40 MG capsule TAKE 1 CAPSULE BY MOUTH EVERY  MORNING 100 capsule 0   pregabalin (LYRICA) 100 MG capsule TAKE 1 CAPSULE BY MOUTH TWICE  DAILY 60 capsule 5   progesterone (PROMETRIUM) 100 MG capsule Take 1 capsule (100 mg total) by mouth 2 (two) times daily. 60 capsule 2   saccharomyces boulardii (FLORASTOR) 250 MG capsule Take 1 capsule (250 mg total) by mouth 2 (two) times daily. 60 capsule 0   Semaglutide, 2 MG/DOSE, (OZEMPIC, 2 MG/DOSE,) 8 MG/3ML SOPN INJECT SUBCUTANEOUSLY 2 MG EVERY WEEK AS DIRECTED 3 mL 0   spironolactone (ALDACTONE) 25 MG tablet Take 1 tablet (25 mg total) by mouth daily. 90 tablet 3   terconazole (TERAZOL 7) 0.4 % vaginal cream Place 1 applicator vaginally at bedtime. 45 g 0   No current facility-administered medications for this visit.    Allergies as of 08/26/2023 - Review Complete 08/25/2023  Allergen Reaction Noted   Amlodipine  08/14/2022   Atorvastatin Other (See Comments) 12/12/2020   Crestor [rosuvastatin]  01/01/2021   Irbesartan  08/14/2022   Metformin and related Nausea And Vomiting 10/12/2016   Metronidazole Nausea And Vomiting 08/26/2018   Penicillins  10/12/2016   Xanax [alprazolam]  10/12/2016   Glyburide Other (See Comments) 02/04/2015   Linagliptin  Diarrhea and Other (See Comments) 07/18/2014   Moxifloxacin Diarrhea 04/10/2014    Family History  Problem Relation Age of Onset   Cancer Mother        cervical, breast, lungm skin, bladder-ex smoker   Hypertension Mother    Diabetes Father    Heart attack Maternal Grandfather    Multiple sclerosis Paternal Grandmother    Allergic rhinitis Neg Hx    Angioedema Neg Hx    Asthma Neg Hx    Eczema Neg Hx    Urticaria Neg Hx    Colon cancer Neg Hx    Esophageal cancer Neg Hx    Liver disease Neg Hx    Pancreatic cancer Neg Hx    Prostate cancer Neg Hx    Rectal cancer Neg Hx    Stomach cancer  Neg Hx    Colon polyps Neg Hx     Social History   Socioeconomic History   Marital status: Single    Spouse name: Not on file   Number of children: 3   Years of education: Not on file   Highest education level: Not on file  Occupational History   Not on file  Tobacco Use   Smoking status: Every Day    Current packs/day: 0.10    Average packs/day: 0.1 packs/day for 20.0 years (2.0 ttl pk-yrs)    Types: Cigarettes   Smokeless tobacco: Never   Tobacco comments:    Patient is engaged in health coaching for smoking cessation as of 12/26/20  Vaping Use   Vaping status: Never Used  Substance and Sexual Activity   Alcohol use: Yes    Comment: occ   Drug use: Not Currently    Types: Marijuana   Sexual activity: Not Currently    Birth control/protection: Surgical    Comment: pt on her mentral cycle now began 11-14-17; BTL  Other Topics Concern   Not on file  Social History Narrative   Lives home with adult niece.  Not working.  Disability pending.  Pt is single.  Education GED.  3 children.    Social Determinants of Health   Financial Resource Strain: Low Risk  (01/25/2023)   Overall Financial Resource Strain (CARDIA)    Difficulty of Paying Living Expenses: Not hard at all  Food Insecurity: Food Insecurity Present (01/25/2023)   Hunger Vital Sign    Worried About Running Out of Food  in the Last Year: Sometimes true    Ran Out of Food in the Last Year: Sometimes true  Transportation Needs: No Transportation Needs (01/25/2023)   PRAPARE - Administrator, Civil Service (Medical): No    Lack of Transportation (Non-Medical): No  Physical Activity: Inactive (01/25/2023)   Exercise Vital Sign    Days of Exercise per Week: 0 days    Minutes of Exercise per Session: 0 min  Stress: Stress Concern Present (01/25/2023)   [employer] of Occupational Health - Occupational Stress Questionnaire    Feeling of Stress : To some extent  Social Connections: Not on file  Intimate Partner Violence: Not At Risk (03/06/2023)   Humiliation, Afraid, Rape, and Kick questionnaire    Fear of Current or Ex-Partner: No    Emotionally Abused: No    Physically Abused: No    Sexually Abused: No    Review of Systems:    Constitutional: No weight loss, fever, chills, weakness or fatigue HEENT: Eyes: No change in vision               Ears, Nose, Throat:  No change in hearing or congestion Skin: No rash or itching Cardiovascular: No chest pain, chest pressure or palpitations   Respiratory: No SOB or cough Gastrointestinal: See HPI and otherwise negative Genitourinary: No dysuria or change in urinary frequency Neurological: No headache, dizziness or syncope Musculoskeletal: No new muscle or joint pain Hematologic: No bleeding or bruising Psychiatric: No history of depression or anxiety    Physical Exam:  Vital signs: There were no vitals taken for this visit.  Constitutional: NAD, Well developed, Well nourished, alert and cooperative Head:  Normocephalic and atraumatic. Eyes:   PEERL, EOMI. No icterus. Conjunctiva pink. Respiratory: Respirations even and unlabored. Lungs clear to auscultation bilaterally.   No wheezes, crackles, or rhonchi.  Cardiovascular:  Regular rate and rhythm. No peripheral edema, cyanosis  or pallor.  Gastrointestinal:  Soft, nondistended, nontender. No  rebound or guarding. Normal bowel sounds. No appreciable masses or hepatomegaly. Rectal:  Not performed.  Msk:  Symmetrical without gross deformities. Without edema, no deformity or joint abnormality.  Neurologic:  Alert and  oriented x4;  grossly normal neurologically.  Skin:   Dry and intact without significant lesions or rashes. Psychiatric: Oriented to person, place and time. Demonstrates good judgement and reason without abnormal affect or behaviors.   RELEVANT LABS AND IMAGING: CBC    Component Value Date/Time   WBC 11.0 (H) 08/13/2023 0921   RBC 4.74 08/13/2023 0921   HGB 14.9 08/13/2023 0921   HGB 13.9 06/02/2023 1117   HGB 10.8 (L) 02/05/2023 1147   HCT 43.2 08/13/2023 0921   HCT 33.9 (L) 02/05/2023 1147   PLT 287 08/13/2023 0921   PLT 279 06/02/2023 1117   PLT 312 02/05/2023 1147   MCV 91.1 08/13/2023 0921   MCV 89 02/05/2023 1147   MCH 31.4 08/13/2023 0921   MCHC 34.5 08/13/2023 0921   RDW 13.5 08/13/2023 0921   RDW 14.8 02/05/2023 1147   LYMPHSABS 2.2 08/13/2023 0921   LYMPHSABS 2.0 02/05/2023 1147   MONOABS 0.6 08/13/2023 0921   EOSABS 0.1 08/13/2023 0921   EOSABS 0.0 02/05/2023 1147   BASOSABS 0.1 08/13/2023 0921   BASOSABS 0.0 02/05/2023 1147    CMP     Component Value Date/Time   NA 135 08/13/2023 0921   NA 140 02/17/2023 1009   K 4.9 08/13/2023 0921   CL 101 08/13/2023 0921   CO2 27 08/13/2023 0921   GLUCOSE 318 (H) 08/13/2023 0921   BUN 21 (H) 08/13/2023 0921   BUN 15 02/17/2023 1009   CREATININE 0.79 08/13/2023 0921   CREATININE 0.64 10/28/2016 0851   CALCIUM 9.5 08/13/2023 0921   PROT 6.7 03/08/2023 1316   PROT 6.5 12/01/2022 0955   ALBUMIN 3.8 03/08/2023 1316   ALBUMIN 3.9 12/01/2022 0955   AST 9 (L) 03/08/2023 1316   ALT 7 03/08/2023 1316   ALKPHOS 63 03/08/2023 1316   BILITOT 0.4 03/08/2023 1316   GFRNONAA >60 08/13/2023 0921   GFRNONAA >89 10/28/2016 0851   GFRAA 127 12/12/2020 1149   GFRAA >89 10/28/2016 0851      Assessment/Plan:       Lara Mulch [facility] 08/25/2023, 11:17 AM  Cc: Hoy Register, MD

## 2023-08-26 ENCOUNTER — Encounter: Payer: Self-pay | Admitting: Gastroenterology

## 2023-08-26 ENCOUNTER — Ambulatory Visit: Payer: 59 | Admitting: Gastroenterology

## 2023-08-26 ENCOUNTER — Other Ambulatory Visit: Payer: Self-pay

## 2023-08-26 ENCOUNTER — Encounter (HOSPITAL_BASED_OUTPATIENT_CLINIC_OR_DEPARTMENT_OTHER): Payer: Self-pay | Admitting: *Deleted

## 2023-08-26 VITALS — BP 130/84 | HR 87 | Ht 62.0 in | Wt 166.1 lb

## 2023-08-26 DIAGNOSIS — R140 Abdominal distension (gaseous): Secondary | ICD-10-CM | POA: Diagnosis not present

## 2023-08-26 DIAGNOSIS — R195 Other fecal abnormalities: Secondary | ICD-10-CM

## 2023-08-26 DIAGNOSIS — K909 Intestinal malabsorption, unspecified: Secondary | ICD-10-CM

## 2023-08-26 NOTE — Patient Instructions (Addendum)
Your provider has ordered "[employer]" stool testing for you. You have received a kit from our office today containing all necessary supplies to complete this test. Please carefully read the stool collection instructions provided in the kit before opening the accompanying materials. In addition, be sure to place the label from the top right corner of the laboratory request sheet onto the "puritan opti-swab" tube that is supplied in the kit. This label should include your full name and date of birth. After completing the test, you should secure the purtian tube into the specimen biohazard bag. The laboratory request information sheet (including date and time of specimen collection) should be placed into the outside pocket of the specimen biohazard bag and returned to the [facility] lab with 2 days of collection.     If the laboratory information sheet specimen date and time are not filled out, the test will NOT be performed.  Due to recent changes in healthcare laws, you may see the results of your imaging and laboratory studies on MyChart before your provider has had a chance to review them.  We understand that in some cases there may be results that are confusing or concerning to you. Not all laboratory results come back in the same time frame and the provider may be waiting for multiple results in order to interpret others.  Please give [place] 48 hours in order for your provider to thoroughly review all the results before contacting the office for clarification of your results.   I appreciate the  opportunity to care for you  Thank You   [facility]

## 2023-08-26 NOTE — Progress Notes (Signed)
Addendum: Reviewed and agree with assessment and management plan. Kadijah Shamoon M, MD  

## 2023-08-27 ENCOUNTER — Encounter: Payer: Self-pay | Admitting: Adult Health

## 2023-08-27 LAB — MICROALBUMIN / CREATININE URINE RATIO
Creatinine, Urine: 102 mg/dL
Microalb/Creat Ratio: 40 mg/g{creat} — ABNORMAL HIGH (ref 0–29)
Microalbumin, Urine: 40.8 ug/mL

## 2023-08-30 ENCOUNTER — Encounter: Payer: Self-pay | Admitting: Family Medicine

## 2023-08-30 ENCOUNTER — Encounter (HOSPITAL_BASED_OUTPATIENT_CLINIC_OR_DEPARTMENT_OTHER): Payer: Self-pay | Admitting: Obstetrics & Gynecology

## 2023-08-30 ENCOUNTER — Ambulatory Visit: Payer: 59 | Admitting: Family Medicine

## 2023-08-30 ENCOUNTER — Ambulatory Visit (HOSPITAL_BASED_OUTPATIENT_CLINIC_OR_DEPARTMENT_OTHER): Payer: 59 | Admitting: Certified Nurse Midwife

## 2023-08-30 ENCOUNTER — Other Ambulatory Visit: Payer: 59

## 2023-08-30 ENCOUNTER — Inpatient Hospital Stay: Payer: 59 | Attending: Internal Medicine

## 2023-08-30 ENCOUNTER — Telehealth: Payer: Self-pay

## 2023-08-30 ENCOUNTER — Ambulatory Visit: Payer: 59

## 2023-08-30 ENCOUNTER — Encounter (HOSPITAL_BASED_OUTPATIENT_CLINIC_OR_DEPARTMENT_OTHER): Payer: Self-pay | Admitting: Certified Nurse Midwife

## 2023-08-30 VITALS — BP 157/96 | HR 74 | Ht 62.0 in | Wt 163.6 lb

## 2023-08-30 DIAGNOSIS — R350 Frequency of micturition: Secondary | ICD-10-CM

## 2023-08-30 DIAGNOSIS — R195 Other fecal abnormalities: Secondary | ICD-10-CM

## 2023-08-30 DIAGNOSIS — N939 Abnormal uterine and vaginal bleeding, unspecified: Secondary | ICD-10-CM | POA: Diagnosis not present

## 2023-08-30 DIAGNOSIS — D72829 Elevated white blood cell count, unspecified: Secondary | ICD-10-CM | POA: Diagnosis not present

## 2023-08-30 DIAGNOSIS — R140 Abdominal distension (gaseous): Secondary | ICD-10-CM | POA: Diagnosis not present

## 2023-08-30 DIAGNOSIS — D72825 Bandemia: Secondary | ICD-10-CM

## 2023-08-30 DIAGNOSIS — D509 Iron deficiency anemia, unspecified: Secondary | ICD-10-CM | POA: Insufficient documentation

## 2023-08-30 DIAGNOSIS — B3731 Acute candidiasis of vulva and vagina: Secondary | ICD-10-CM | POA: Insufficient documentation

## 2023-08-30 DIAGNOSIS — K909 Intestinal malabsorption, unspecified: Secondary | ICD-10-CM | POA: Diagnosis not present

## 2023-08-30 DIAGNOSIS — D500 Iron deficiency anemia secondary to blood loss (chronic): Secondary | ICD-10-CM

## 2023-08-30 LAB — CMP (CANCER CENTER ONLY)
ALT: 14 U/L (ref 0–44)
AST: 13 U/L — ABNORMAL LOW (ref 15–41)
Albumin: 4.1 g/dL (ref 3.5–5.0)
Alkaline Phosphatase: 59 U/L (ref 38–126)
Anion gap: 5 (ref 5–15)
BUN: 21 mg/dL — ABNORMAL HIGH (ref 6–20)
CO2: 28 mmol/L (ref 22–32)
Calcium: 9.8 mg/dL (ref 8.9–10.3)
Chloride: 101 mmol/L (ref 98–111)
Creatinine: 0.83 mg/dL (ref 0.44–1.00)
GFR, Estimated: >60 mL/min (ref >60)
Glucose, Bld: 237 mg/dL — ABNORMAL HIGH (ref 70–99)
Potassium: 4.8 mmol/L (ref 3.5–5.1)
Sodium: 134 mmol/L — ABNORMAL LOW (ref 135–145)
Total Bilirubin: 0.6 mg/dL (ref <1.2)
Total Protein: 7.5 g/dL (ref 6.5–8.1)

## 2023-08-30 LAB — POCT URINALYSIS DIPSTICK
Bilirubin, UA: NEGATIVE
Glucose, UA: POSITIVE — AB
Ketones, UA: NEGATIVE
Leukocytes, UA: NEGATIVE
Nitrite, UA: NEGATIVE
Protein, UA: NEGATIVE
Spec Grav, UA: >=1.03 — AB (ref 1.010–1.025)
Urobilinogen, UA: 0.2 U/dL
pH, UA: 6 (ref 5.0–8.0)

## 2023-08-30 LAB — CBC WITH DIFFERENTIAL/PLATELET
Abs Immature Granulocytes: 0.03 10*3/uL (ref 0.00–0.07)
Basophils Absolute: 0 10*3/uL (ref 0.0–0.1)
Basophils Relative: 0 %
Eosinophils Absolute: 0.1 10*3/uL (ref 0.0–0.5)
Eosinophils Relative: 1 %
HCT: 45.2 % (ref 36.0–46.0)
Hemoglobin: 15.8 g/dL — ABNORMAL HIGH (ref 12.0–15.0)
Immature Granulocytes: 0 %
Lymphocytes Relative: 22 %
Lymphs Abs: 2.6 10*3/uL (ref 0.7–4.0)
MCH: 32.1 pg (ref 26.0–34.0)
MCHC: 35 g/dL (ref 30.0–36.0)
MCV: 91.9 fL (ref 80.0–100.0)
Monocytes Absolute: 0.6 10*3/uL (ref 0.1–1.0)
Monocytes Relative: 5 %
Neutro Abs: 8.4 10*3/uL — ABNORMAL HIGH (ref 1.7–7.7)
Neutrophils Relative %: 72 %
Platelets: 307 10*3/uL (ref 150–400)
RBC: 4.92 MIL/uL (ref 3.87–5.11)
RDW: 14.4 % (ref 11.5–15.5)
WBC: 11.7 10*3/uL — ABNORMAL HIGH (ref 4.0–10.5)
nRBC: 0 % (ref 0.0–0.2)

## 2023-08-30 LAB — VITAMIN B12: Vitamin B-12: 585 pg/mL (ref 180–914)

## 2023-08-30 MED ORDER — FLUCONAZOLE 150 MG PO TABS
ORAL_TABLET | ORAL | 0 refills | Status: DC
Start: 2023-08-30 — End: 2023-10-13

## 2023-08-30 NOTE — Progress Notes (Signed)
    Subjective:     Amber Rose is a 47 y.o. female who presents for evaluation of external vulvar irritation and itching. States she has been on antibiotics twice over the past month for UTI. She would like urine culture sent today to confirm that UTI resolved (sent).   Patient states that she is on Norethindrone and Progesterone for abnormal uterine bleeding. Instructed today that she may discontinue the Progesterone and continue Norethindrone. Pt states that PCP told her the Norethindrone is likely responsible for her elevated uncontrolled blood sugars. Pt reassured that Norethindrone not likely to be the cause of her elevated blood sugars, but elevated blood sugars may be contributing to yeast infections.   Pt again declines Mirena IUD (for AUB), declines Nexplanon, declines Depo, declines surgical management, declines consult for uterine artery embolization. Pt would appreciate a 2nd opinion from another Ob/Gyn location on options for management of bleeding (referral placed).   The following portions of the patient's history were reviewed and updated as appropriate: allergies, current medications, past family history, past medical history, past social history, past surgical history, and problem list.   Review of Systems Pertinent items are noted in HPI.    Objective:    BP (!) 157/96 (BP Location: Left Arm, Patient Position: Sitting, Cuff Size: Large)   Pulse 74   Ht 5\' 2"  (1.575 m)   Wt 163 lb 9.6 oz (74.2 kg)   LMP 08/18/2023   BMI 29.92 kg/m  Pelvic: cervix normal in appearance, vagina normal without discharge, and externally there is mild erythema appears c/w candidal overgrowth.     Assessment:    Monilial vulvo-vaginitis.    Plan:    Diflucan 150mg  po x one, repeat in 2-3 days if needed x 1. Referral to Gyn to discuss AUB options for management. Continue Norethindrone. Discontinue Progesterone. Routine urine culture for test of cure  Amber Rose

## 2023-08-30 NOTE — Telephone Encounter (Signed)
-----   Message from Wiley Ford Iruku sent at 08/30/2023  2:34 PM EST ----- Her B12 levels from today are normal.  Please let her know.

## 2023-08-30 NOTE — Telephone Encounter (Signed)
Pt called and informed that her B12 levels were normal. Pt requested to discuss her kidney function and CBC with Dr. Al Pimple as well. This RN stated that she would let her know. Pt verbalized understanding.

## 2023-08-31 ENCOUNTER — Telehealth: Payer: Self-pay

## 2023-08-31 NOTE — Telephone Encounter (Signed)
This RN LVM for pt to please call back to discuss remaining lab results but stated there is nothing of concern. Call back number [phone] provided.

## 2023-09-01 ENCOUNTER — Telehealth: Payer: Self-pay | Admitting: Licensed Clinical Social Worker

## 2023-09-01 LAB — URINE CULTURE

## 2023-09-01 NOTE — Telephone Encounter (Signed)
A user error has taken place: encounter opened in error, closed for administrative reasons.

## 2023-09-01 NOTE — Telephone Encounter (Signed)
Met patient during a warm hand off from Dr. Alvis Lemmings. Patient expressed that she wanted legal aid information.  And medication management resources, due to taking a large amount of medications for anxiety/depression and other health concerns.  Patient shared that she was not interested in therapy services at the moment.  L[facility]WA will send patient a list of resources via my chart.

## 2023-09-02 ENCOUNTER — Other Ambulatory Visit: Payer: Self-pay | Admitting: Family Medicine

## 2023-09-02 DIAGNOSIS — M79651 Pain in right thigh: Secondary | ICD-10-CM

## 2023-09-03 NOTE — Telephone Encounter (Signed)
Requested Prescriptions  Pending Prescriptions Disp Refills   ibuprofen (ADVIL) 800 MG tablet [Pharmacy Med Name: Ibuprofen 800 MG Oral Tablet] 90 tablet 0    Sig: TAKE 1 TABLET BY MOUTH EVERY 12  HOURS AS NEEDED     Analgesics:  NSAIDS Failed - 09/02/2023 10:39 PM      Failed - Manual Review: Labs are only required if the patient has taken medication for more than 8 weeks.      Failed - HGB in normal range and within 360 days    Hemoglobin  Date Value Ref Range Status  08/30/2023 15.8 (H) 12.0 - 15.0 g/dL Final  13/24/4010 27.2 12.0 - 15.0 g/dL Final  53/66/4403 47.4 (L) 11.1 - 15.9 g/dL Final         Passed - Cr in normal range and within 360 days    Creatinine  Date Value Ref Range Status  08/30/2023 0.83 0.44 - 1.00 mg/dL Final   Creat  Date Value Ref Range Status  10/28/2016 0.64 0.50 - 1.10 mg/dL Final   Creatinine, Urine  Date Value Ref Range Status  10/28/2016 158 20 - 320 mg/dL Final         Passed - PLT in normal range and within 360 days    Platelets  Date Value Ref Range Status  08/30/2023 307 150 - 400 K/uL Final  02/05/2023 312 150 - 450 x10E3/uL Final   Platelet Count  Date Value Ref Range Status  06/02/2023 279 150 - 400 K/uL Final         Passed - HCT in normal range and within 360 days    HCT  Date Value Ref Range Status  08/30/2023 45.2 36.0 - 46.0 % Final   Hematocrit  Date Value Ref Range Status  02/05/2023 33.9 (L) 34.0 - 46.6 % Final         Passed - eGFR is 30 or above and within 360 days    GFR, Est African American  Date Value Ref Range Status  10/28/2016 >89 >=60 mL/min Final   GFR calc Af Amer  Date Value Ref Range Status  12/12/2020 127 >59 mL/min/1.73 Final    Comment:    **In accordance with recommendations from the NKF-ASN Task force,**   [facility] is in the process of updating its eGFR calculation to the   2021 CKD-EPI creatinine equation that estimates kidney function   without a race variable.    GFR, Est Non African  American  Date Value Ref Range Status  10/28/2016 >89 >=60 mL/min Final   GFR, Estimated  Date Value Ref Range Status  08/30/2023 >60 >60 mL/min Final    Comment:    (NOTE) Calculated using the CKD-EPI Creatinine Equation (2021)    GFR  Date Value Ref Range Status  02/20/2022 106.09 >60.00 mL/min Final    Comment:    Calculated using the CKD-EPI Creatinine Equation (2021)   eGFR  Date Value Ref Range Status  02/17/2023 73 >59 mL/min/1.73 Final         Passed - Patient is not pregnant      Passed - Valid encounter within last 12 months    Recent Outpatient Visits           1 week ago Statin myopathy   [facility] Of [place]. [facility] Hoy Register, MD   7 months ago Type 2 diabetes mellitus with other neurologic complication, without long-term current use of insulin (HCC)  [facility] Dept Of [place]. [facility] Lois Huxley, Calvin L, RPH-CPP   7 months ago Type 2 diabetes mellitus with other neurologic complication, without long-term current use of insulin (HCC)   [facility] - [facility] Dept Of [place]. [facility] Hoy Register, MD   11 months ago Type 2 diabetes mellitus with other neurologic complication, without long-term current use of insulin (HCC)   [facility] - A Dept Of [place]. [facility] Hoy Register, MD   1 year ago Type 2 diabetes mellitus with other neurologic complication, without long-term current use of insulin (HCC)   [facility] - A Dept Of . [facility] Hoy Register, MD       Future Appointments             In 1 week  [facility] [place]   In 2 weeks Hoy Register, MD [facility] - A Dept Of [facility]igha [facility]. [facility]   In 3 months Hilty, Lisette Abu, MD [facility] at [facility]

## 2023-09-08 ENCOUNTER — Encounter: Payer: Self-pay | Admitting: Family Medicine

## 2023-09-09 ENCOUNTER — Encounter: Payer: Self-pay | Admitting: Allergy & Immunology

## 2023-09-09 ENCOUNTER — Encounter: Payer: Self-pay | Admitting: Adult Health

## 2023-09-09 ENCOUNTER — Ambulatory Visit (INDEPENDENT_AMBULATORY_CARE_PROVIDER_SITE_OTHER): Payer: 59 | Admitting: Allergy & Immunology

## 2023-09-09 ENCOUNTER — Other Ambulatory Visit: Payer: Self-pay

## 2023-09-09 VITALS — BP 136/80 | HR 74 | Temp 98.6°F | Ht 63.39 in | Wt 166.8 lb

## 2023-09-09 DIAGNOSIS — B999 Unspecified infectious disease: Secondary | ICD-10-CM | POA: Diagnosis not present

## 2023-09-09 DIAGNOSIS — J310 Chronic rhinitis: Secondary | ICD-10-CM | POA: Diagnosis not present

## 2023-09-09 DIAGNOSIS — T783XXD Angioneurotic edema, subsequent encounter: Secondary | ICD-10-CM

## 2023-09-09 DIAGNOSIS — L732 Hidradenitis suppurativa: Secondary | ICD-10-CM | POA: Diagnosis not present

## 2023-09-09 NOTE — Patient Instructions (Addendum)
1. Chronic rhinitis - Because of insurance stipulations, we cannot do skin testing on the same day as your first visit. - We are all working to fight this, but for now we need to do two separate visits.  - We will know more after we do testing at the next visit.  - The skin testing visit can be squeezed in at your convenience.  - Then we can make a more full plan to address all of your symptoms.  2. Angioedema - with concern for food allergies - Because of insurance stipulations, we cannot do skin testing on the same day as your first visit. - We will do food testing at a future visit. - If we do the entire panel, we need to schedule that separately from environmental allergy testing.  3. Recurrent infections  - We will obtain some screening labs to evaluate your immune system.  - Labs to evaluate the quantitative [facility]) aspects of your immune system: IgG/IgA/IgM, CBC with differential - Labs to evaluate the qualitative (HOW WELL THEY WORK) aspects of your immune system: CH50, Pneumococcal titers, Tetanus titers, Diphtheria titers - We may consider immunizations with Pneumovax and Tdap to challenge your immune system, and then obtain repeat titers in 4-6 weeks.   4. Hiadrenitis suppurativa  - We will send you to see Dr. Roseanne Reno at [facility].   5. Return in about ONE WEEK FOR ENVIRONMENTAL TESTING (1-55) and then ONE WEEK FOR FOOD TESTING (1-72).   Please inform us of any Emergency Department visits, hospitalizations, or changes in symptoms. Call us before going to the ED for breathing or allergy symptoms since we might be able to fit you in for a sick visit. Feel free to contact us anytime with any questions, problems, or concerns.  It was a pleasure to see you again today!  Websites that have reliable patient information: 1. American Academy of Asthma, Allergy, and Immunology: www.aaaai.org 2. Food Allergy Research and Education (FARE): foodallergy.org 3. Mothers of  Asthmatics: http://www.asthmacommunitynetwork.org 4. American College of Allergy, Asthma, and Immunology: www.acaai.org      "Like" [place] on Facebook and Instagram for our latest updates!      A healthy democracy works best when [employer] participate! Make sure you are registered to vote! If you have moved or changed any of your contact information, you will need to get this updated before voting! Scan the QR codes below to learn more!

## 2023-09-09 NOTE — Progress Notes (Signed)
NEW PATIENT  Date of Service/Encounter:  09/09/23  Consult requested by: Hoy Register, MD   Assessment:   Chronic rhinitis  Angioedema - redoing allergy testing over the next couple of weeks  Recurrent infections - redoing immune workup today  Hidradenitis   Complicated past medical history, including fibromyalgia, type 2 diabetes, elevated blood pressure   Plan/Recommendations:   1. Chronic rhinitis - Because of insurance stipulations, we cannot do skin testing on the same day as your first visit. - We are all working to fight this, but for now we need to do two separate visits.  - We will know more after we do testing at the next visit.  - The skin testing visit can be squeezed in at your convenience.  - Then we can make a more full plan to address all of your symptoms.  2. Angioedema - with concern for food allergies - Because of insurance stipulations, we cannot do skin testing on the same day as your first visit. - We will do food testing at a future visit. - If we do the entire panel, we need to schedule that separately from environmental allergy testing.  3. Recurrent infections  - We will obtain some screening labs to evaluate your immune system.  - Labs to evaluate the quantitative [facility]) aspects of your immune system: IgG/IgA/IgM, CBC with differential - Labs to evaluate the qualitative (HOW WELL THEY WORK) aspects of your immune system: CH50, Pneumococcal titers, Tetanus titers, Diphtheria titers - We may consider immunizations with Pneumovax and Tdap to challenge your immune system, and then obtain repeat titers in 4-6 weeks.   4. Hiadrenitis suppurativa  - We will send you to see Dr. Roseanne Reno at [facility].   5. Return in about ONE WEEK FOR ENVIRONMENTAL TESTING (1-55) and then ONE WEEK FOR FOOD TESTING (1-72).   This note in its entirety was forwarded to the Provider who requested this consultation.  Subjective:   Amber Rose  is a 47 y.o. female presenting today for evaluation of  Chief Complaint  Patient presents with   Allergic Rhinitis    Medication Problem   Establish Care    Amber Rose has a history of the following: Patient Active Problem List   Diagnosis Date Noted   Yeast vaginitis 08/30/2023   Iron deficiency anemia 03/11/2023   PMDD (premenstrual dysphoric disorder) 11/17/2022   Abnormal uterine bleeding (AUB) 11/17/2022   Statin myopathy 09/23/2022   Steatorrhea 08/17/2022   Irregular menses 11/28/2021   Perimenopause 11/28/2021   Episode of recurrent major depressive disorder (HCC) 04/09/2021   BMI 33.0-33.9,adult 04/09/2021   Pure hypercholesterolemia 12/23/2020   Menorrhagia with regular cycle 09/13/2020   Well woman exam 09/13/2020   History of herpes zoster 01/17/2018   Overactive bladder 12/28/2017   Recurrent infections 11/19/2017   Cyst of nasal cavity 11/19/2017   Angio-edema 11/19/2017   Abdominal pain, epigastric 11/04/2017   Dysphagia 11/04/2017   Gastroesophageal reflux disease 07/12/2017   Degenerative disc disease at L5-S1 level 05/14/2017   Fibromyalgia 05/14/2017   ADD (attention deficit disorder) 05/14/2017   Urinary incontinence 04/05/2017   Thigh pain 02/02/2017   Anxiety and depression 02/02/2017   Tobacco abuse 02/02/2017   Facial flushing 02/02/2017   Hypertension 10/23/2016   Hidradenitis 10/23/2016   Type II diabetes mellitus, uncontrolled 09/12/2014   Lupus anticoagulant positive 07/03/2014   Vitamin D deficiency 06/06/2014   Benign essential hypertension 06/06/2014    History obtained from: chart review and patient.  Discussed the use of AI scribe software for clinical note transcription with the patient and/or guardian, who gave verbal consent to proceed.  [name] was referred by Hoy Register, MD.     Amber Rose is a 47 y.o. female presenting for an evaluation of multiple atopic complaints . We actually saw her for a history  of angioedema and we were unable to come up with a particular trigger. When we last saw her in February 2021, we referred her to see a tertiary care center for a second opinion. We never did figure out a trigger, but she did have improvement with cetirizine 10mg  daily. We tried Xolair for three months, but she did not feel that this worked much at all.   It does look like she saw Dr. Steffanie Dunn at [facility]. No additional labs were recommended. 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 She did have an abdominal CT in Mach 2021 after this visit that was negative.   Review of her labs: She had a negative ANA and thyroid studies (May 2018). She had a negative lupus anticoagulant, thyroid studies, and even ANCA panel and ACE level. She did have a positive ANA in the distant past in June 2015, but it was negative last time that we saw her. 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 She had a negative Factor XII mutation analysis in March 2019. We did a workup for dermatomyositis, including a CK, which was negative. Due to the history of CREST syndrome (Rheuamtology never confirmed this), we did a Sjogren's syndrome antibody and anti-Jo antibody; these were negative. Earlier in her workup, labs for HAE were all normal. Environmental allergy and food allergy panels in February 2018 were negative. We did an immune screen including antibody levels and B cell markers; these were normal. She had an excellent response to Pneumovax, going from 3/23 to 16/23 serotypes of Pneumococcus. T cell numbers were excellent as well.   Amber Rose had moved to [place] to help her 98yo grandmother. Her grandmother passed away and she made the decision to move back. Her daughter did complete her high school at a sister school in [place] and she was there for 1.5 years until COVID  hit, at which time she flew home. She is now working as an International aid/development worker in [employer] and she is liking this quite a bit.   Amber Rose reports ongoing health concerns. The primary concern is a significant reaction to statin medication, which the patient believes has caused permanent damage. The patient reported feeling significantly better after inadvertently missing a dose of the statin, but experienced severe pain upon resuming the medication. Since discontinuing the statin, the patient has noticed an improvement in overall health, but still experiences occasional flare-ups of leg pain and other symptoms.  The patient also reports high cholesterol, but is reluctant to take statins due to the previous adverse reaction. They have also experienced issues with high blood sugar levels, which have been difficult to control despite various interventions, including the use of Ozempic. The patient lost a significant amount of weight while on Ozempic, but this led to other health issues, including anemia and nutrient deficiencies.  The patient has a history of multiple infections, including a recent gut infection and recurrent urinary tract infections. They have had multiple rounds of antibiotics, with varying degrees of success. The patient also reports a  history of fibromyalgia, which is managed with ibuprofen and Pregabalin. She has been seen by Rheumatology in the past. I did send an ANA years ago and this was negative.   The patient lives alone and works in group homes, which may expose them to various environmental factors. They have noticed some symptoms that may suggest environmental allergies, such as a runny nose and a feeling of swelling after eating certain foods. The patient also has a history of hidradenitis, presenting as occasional cysts.  The tremors and hand cramping associated with this condition have improved significantly since starting this medication.  The patient also reports  right shoulder pain following a recent fall, with a suspected rotator cuff injury. She also has a history of reactions to medications. She suffers from high cholesterol, high blood sugar, recurrent infections, fibromyalgia, environmental allergies, and a recent shoulder injury.   Otherwise, there is no history of other atopic diseases, including drug allergies, stinging insect allergies, or contact dermatitis. There is no significant infectious history. Vaccinations are up to date.    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   Steatorrhea 08/17/2022   Irregular menses 11/28/2021   Perimenopause 11/28/2021   Episode of recurrent major depressive disorder (HCC) 04/09/2021   BMI 33.0-33.9,adult 04/09/2021   Pure hypercholesterolemia 12/23/2020   Menorrhagia with regular cycle 09/13/2020   Well woman exam 09/13/2020   History of herpes zoster 01/17/2018   Overactive bladder 12/28/2017   Recurrent infections 11/19/2017   Cyst of nasal cavity 11/19/2017   Angio-edema 11/19/2017   Abdominal pain, epigastric 11/04/2017   Dysphagia 11/04/2017   Gastroesophageal reflux disease 07/12/2017   Degenerative disc disease at L5-S1 level 05/14/2017   Fibromyalgia 05/14/2017   ADD (attention deficit disorder) 05/14/2017   Urinary incontinence 04/05/2017   Thigh pain 02/02/2017   Anxiety and depression 02/02/2017   Tobacco abuse 02/02/2017   Facial flushing 02/02/2017   Hypertension 10/23/2016   Hidradenitis 10/23/2016   Type II diabetes mellitus, uncontrolled 09/12/2014   Lupus anticoagulant positive 07/03/2014   Vitamin D deficiency 06/06/2014   Benign essential hypertension 06/06/2014    Medication List:  Allergies as of 09/09/2023       Reactions   Amlodipine     Leg pain, swelling around eyes   Atorvastatin Other (See Comments)    Joint pain   Crestor [rosuvastatin]    Joint pain    Irbesartan    Leg pain, swelling around eyes   Metformin And Related Nausea And Vomiting   Metronidazole Nausea And Vomiting   Penicillins    childhood   Xanax [alprazolam]    "Caused upper respiratory symptoms" per pt   Glyburide Other (See Comments)   "blood sugar dropped uncontrollably"   Linagliptin Diarrhea, Other (See Comments)   Stomach pain, sinus infection   Moxifloxacin Diarrhea        Medication List        Accurate as of September 09, 2023  1:10 PM. If you have any questions, ask your nurse or doctor.          Accu-Chek Aviva Plus w/Device Kit USE AS DIRECTED DAILY. E11.9   accu-chek softclix lancets Use as instructed daily.   acyclovir 400 MG tablet Commonly known as: ZOVIRAX TAKE 1 TABLET BY MOUTH TWICE  DAILY  albuterol 108 (90 Base) MCG/ACT inhaler Commonly known as: ProAir HFA Inhale 2 puffs into the lungs every 4 (four) hours as needed for wheezing or shortness of breath.   buPROPion 150 MG 24 hr tablet Commonly known as: Wellbutrin XL Take 1 tablet (150 mg total) by mouth daily.   diazepam 10 MG tablet Commonly known as: VALIUM Take 1 tablet one hour before procedure, repeat x 1 if needed.   fluconazole 150 MG tablet Commonly known as: DIFLUCAN Take 1 and repeat in 3 days   freestyle lancets Use to check blood sugar three times daily. E11.49   FreeStyle Libre 2 Reader [place]vaston Use to check blood sugar three times daily. E11.49   FreeStyle Libre 2 Sensor Misc Use to check blood sugar three times daily. Change sensors once every 14 days. E11.49   FREESTYLE TEST STRIPS test strip Generic drug: glucose blood Use to check blood sugar three times daily. E11.49   hydrOXYzine 25 MG tablet Commonly known as: ATARAX Take 25 mg by mouth 2 (two) times daily.   ibuprofen 800 MG tablet Commonly known as: ADVIL TAKE 1  TABLET BY MOUTH EVERY 12  HOURS AS NEEDED   Insulin Pen Needle 31G X 5 MM Misc 1 each by Does not apply route at bedtime.   Lantus SoloStar 100 UNIT/ML Solostar Pen Generic drug: insulin glargine Inject 10 Units into the skin daily.   metFORMIN 500 MG tablet Commonly known as: GLUCOPHAGE Take 1 tablet (500 mg total) by mouth 2 (two) times daily with a meal.   methocarbamol 750 MG tablet Commonly known as: ROBAXIN Take 1 tablet (750 mg total) by mouth every 8 (eight) hours as needed for muscle spasms.   Multi-Vitamin Daily Tabs Take 1 tablet by mouth daily as needed.   nitrofurantoin (macrocrystal-monohydrate) 100 MG capsule Commonly known as: MACROBID Take 1 capsule (100 mg total) by mouth 2 (two) times daily.   omeprazole 40 MG capsule Commonly known as: PRILOSEC TAKE 1 CAPSULE BY MOUTH EVERY  MORNING   Ozempic (2 MG/DOSE) 8 MG/3ML Sopn Generic drug: Semaglutide (2 MG/DOSE) INJECT SUBCUTANEOUSLY 2 MG EVERY WEEK AS DIRECTED   pregabalin 100 MG capsule Commonly known as: LYRICA TAKE 1 CAPSULE BY MOUTH TWICE  DAILY   saccharomyces boulardii 250 MG capsule Commonly known as: Florastor Take 1 capsule (250 mg total) by mouth 2 (two) times daily.   spironolactone 25 MG tablet Commonly known as: ALDACTONE Take 1 tablet (25 mg total) by mouth daily.   terconazole 0.4 % vaginal cream Commonly known as: TERAZOL 7 Place 1 applicator vaginally at bedtime.        Birth History: non-contributory  Developmental History: non-contributory  Past Surgical History: Past Surgical History:  Procedure Laterality Date   APPENDECTOMY  1990   CESAREAN SECTION  2002   CHOLECYSTECTOMY  2011   COLONOSCOPY     UPPER GASTROINTESTINAL ENDOSCOPY     URINARY SURGERY     urethra sling, removal, and then revision 2016 (4 surgeries)   WISDOM TOOTH EXTRACTION       Family History: Family History  Problem Relation Age of Onset   Cancer Mother        cervical, breast, lungm skin,  bladder-ex smoker   Hypertension Mother    Diabetes Father    Heart attack Maternal Grandfather    Multiple sclerosis Paternal Grandmother    Allergic rhinitis Neg Hx    Angioedema Neg Hx    Asthma Neg Hx    Eczema  Neg Hx    Urticaria Neg Hx    Colon cancer Neg Hx    Esophageal cancer Neg Hx    Liver disease Neg Hx    Pancreatic cancer Neg Hx    Prostate cancer Neg Hx    Rectal cancer Neg Hx    Stomach cancer Neg Hx    Colon polyps Neg Hx      Social History: Amber Rose lives at home with her family. She lives in a house that she rents. She reports that this is well kept and she denies any mold exposures at all. She has hardwood throughout the home. She had cats inside of the home. There is electric heating and central cooling. There are no dust mite coverings on the bedding. She works as a Quarry manager in two group homes. There is tobacco exposure in the home as well as the car.    Review of systems otherwise negative other than that mentioned in the HPI.    Objective:   Blood pressure 136/80, pulse 74, temperature 98.6 F (37 C), temperature source Temporal, height 5' 3.39" (1.61 m), weight 166 lb 12.8 oz (75.7 kg), last menstrual period 08/18/2023, SpO2 98%. Body mass index is 29.19 kg/m.     Physical Exam Vitals reviewed.  Constitutional:      Appearance: She is well-developed.  HENT:     Head: Normocephalic and atraumatic.     Right Ear: Tympanic membrane, ear canal and external ear normal. No drainage, swelling or tenderness. Tympanic membrane is not injected, scarred, erythematous, retracted or bulging.     Left Ear: Tympanic membrane, ear canal and external ear normal. No drainage, swelling or tenderness. Tympanic membrane is not injected, scarred, erythematous, retracted or bulging.     Nose: No nasal deformity, septal deviation, mucosal edema or rhinorrhea.     Right Turbinates: Enlarged, swollen and pale.     Left Turbinates: Enlarged, swollen and  pale.     Right Sinus: No maxillary sinus tenderness or frontal sinus tenderness.     Left Sinus: No maxillary sinus tenderness or frontal sinus tenderness.     Mouth/Throat:     Mouth: Mucous membranes are not pale and not dry.     Pharynx: Uvula midline.  Eyes:     General:        Right eye: No discharge.        Left eye: No discharge.     Conjunctiva/sclera: Conjunctivae normal.     Right eye: Right conjunctiva is not injected. No chemosis.    Left eye: Left conjunctiva is not injected. No chemosis.    Pupils: Pupils are equal, round, and reactive to light.  Cardiovascular:     Rate and Rhythm: Normal rate and regular rhythm.     Heart sounds: Normal heart sounds.  Pulmonary:     Effort: Pulmonary effort is normal. No tachypnea, accessory muscle usage or respiratory distress.     Breath sounds: Normal breath sounds. No wheezing, rhonchi or rales.  Chest:     Chest wall: No tenderness.  Abdominal:     Tenderness: There is no abdominal tenderness. There is no guarding or rebound.  Lymphadenopathy:     Head:     Right side of head: No submandibular, tonsillar or occipital adenopathy.     Left side of head: No submandibular, tonsillar or occipital adenopathy.     Cervical: No cervical adenopathy.  Skin:    General: Skin is warm.     Capillary Refill:  Capillary refill takes less than 2 seconds.     Coloration: Skin is not pale.     Findings: No abrasion, erythema, petechiae or rash. Rash is not papular, urticarial or vesicular.     Comments: Multiple moles. No angioedema appreciated.   Neurological:     Mental Status: She is alert.  Psychiatric:        Behavior: Behavior is cooperative.      Diagnostic studies: labs sent instead with plans for repeat testing for environmental and food allergens         Malachi Bonds, MD [facility]

## 2023-09-14 LAB — PANCREATIC ELASTASE, FECAL: Pancreatic Elastase-1, Stool: >800 ug/g (ref >200)

## 2023-09-15 ENCOUNTER — Encounter: Payer: Self-pay | Admitting: Adult Health

## 2023-09-16 ENCOUNTER — Ambulatory Visit (INDEPENDENT_AMBULATORY_CARE_PROVIDER_SITE_OTHER): Payer: 59 | Admitting: Pharmacist Clinician (PhC)/ Clinical Pharmacy Specialist

## 2023-09-16 ENCOUNTER — Other Ambulatory Visit: Payer: Self-pay | Admitting: *Deleted

## 2023-09-16 VITALS — BP 152/98 | HR 74 | Ht 64.0 in | Wt 165.4 lb

## 2023-09-16 DIAGNOSIS — I10 Essential (primary) hypertension: Secondary | ICD-10-CM

## 2023-09-16 LAB — ALPHA-GAL PANEL
Allergen Lamb IgE: <0.1 kU/L
Beef IgE: <0.1 kU/L
IgE (Immunoglobulin E), Serum: 15 [IU]/mL (ref 6–495)
O215-IgE Alpha-Gal: <0.1 kU/L
Pork IgE: <0.1 kU/L

## 2023-09-16 LAB — TRYPTASE: Tryptase: 7.5 ug/L (ref 2.2–13.2)

## 2023-09-16 LAB — STREP PNEUMONIAE 23 SEROTYPES IGG
Pneumo Ab Type 1*: 0.4 ug/mL — ABNORMAL LOW (ref >1.3)
Pneumo Ab Type 12 (12F)*: <0.1 ug/mL — ABNORMAL LOW (ref >1.3)
Pneumo Ab Type 14*: 1.6 ug/mL (ref >1.3)
Pneumo Ab Type 17 (17F)*: 0.9 ug/mL — ABNORMAL LOW (ref >1.3)
Pneumo Ab Type 19 (19F)*: 2.3 ug/mL (ref >1.3)
Pneumo Ab Type 2*: 6.9 ug/mL (ref >1.3)
Pneumo Ab Type 20*: 0.8 ug/mL — ABNORMAL LOW (ref >1.3)
Pneumo Ab Type 22 (22F)*: <0.1 ug/mL — ABNORMAL LOW (ref >1.3)
Pneumo Ab Type 23 (23F)*: <0.1 ug/mL — ABNORMAL LOW (ref >1.3)
Pneumo Ab Type 26 (6B)*: <0.1 ug/mL — ABNORMAL LOW (ref >1.3)
Pneumo Ab Type 3*: <0.1 ug/mL — ABNORMAL LOW (ref >1.3)
Pneumo Ab Type 34 (10A)*: <0.1 ug/mL — ABNORMAL LOW (ref >1.3)
Pneumo Ab Type 4*: <0.1 ug/mL — ABNORMAL LOW (ref >1.3)
Pneumo Ab Type 43 (11A)*: 0.8 ug/mL — ABNORMAL LOW (ref >1.3)
Pneumo Ab Type 5*: <0.1 ug/mL — ABNORMAL LOW (ref >1.3)
Pneumo Ab Type 51 (7F)*: 0.4 ug/mL — ABNORMAL LOW (ref >1.3)
Pneumo Ab Type 54 (15B)*: 1.5 ug/mL (ref >1.3)
Pneumo Ab Type 56 (18C)*: 0.4 ug/mL — ABNORMAL LOW (ref >1.3)
Pneumo Ab Type 57 (19A)*: 1.2 ug/mL — ABNORMAL LOW (ref >1.3)
Pneumo Ab Type 68 (9V)*: 0.9 ug/mL — ABNORMAL LOW (ref >1.3)
Pneumo Ab Type 70 (33F)*: 3.4 ug/mL (ref >1.3)
Pneumo Ab Type 8*: 11.6 ug/mL (ref >1.3)
Pneumo Ab Type 9 (9N)*: 0.2 ug/mL — ABNORMAL LOW (ref >1.3)

## 2023-09-16 LAB — C-REACTIVE PROTEIN: CRP: 7 mg/L (ref 0–10)

## 2023-09-16 LAB — ANTINUCLEAR ANTIBODIES, IFA: ANA Titer 1: NEGATIVE

## 2023-09-16 LAB — BASIC METABOLIC PANEL
BUN/Creatinine Ratio: 21 (ref 9–23)
BUN: 19 mg/dL (ref 6–24)
CO2: 21 mmol/L (ref 20–29)
Calcium: 9.7 mg/dL (ref 8.7–10.2)
Chloride: 103 mmol/L (ref 96–106)
Creatinine, Ser: 0.91 mg/dL (ref 0.57–1.00)
Glucose: 181 mg/dL — ABNORMAL HIGH (ref 70–99)
Potassium: 4.5 mmol/L (ref 3.5–5.2)
Sodium: 139 mmol/L (ref 134–144)
eGFR: 78 mL/min/{1.73_m2} (ref >59)

## 2023-09-16 LAB — IGG, IGA, IGM
IgA/Immunoglobulin A, Serum: 374 mg/dL — ABNORMAL HIGH (ref 87–352)
IgG (Immunoglobin G), Serum: 851 mg/dL (ref 586–1602)
IgM (Immunoglobulin M), Srm: 52 mg/dL (ref 26–217)

## 2023-09-16 LAB — COMPLEMENT, TOTAL: Compl, Total (CH50): >60 U/mL (ref >41)

## 2023-09-16 LAB — DIPHTHERIA / TETANUS ANTIBODY PANEL
Diphtheria Ab: 0.33 [IU]/mL (ref <0.10)
Tetanus Ab, IgG: 2.48 [IU]/mL (ref <0.10)

## 2023-09-16 LAB — RHEUMATOID FACTOR: Rheumatoid fact SerPl-aCnc: <10 [IU]/mL (ref <14.0)

## 2023-09-16 LAB — SEDIMENTATION RATE: Sed Rate: 25 mm/h (ref 0–32)

## 2023-09-16 MED ORDER — SPIRONOLACTONE 50 MG PO TABS: 50.0000 mg | ORAL_TABLET | Freq: Every day | ORAL | 3 refills | Status: DC

## 2023-09-16 MED ORDER — RIFAXIMIN 550 MG PO TABS: 550.0000 mg | ORAL_TABLET | Freq: Three times a day (TID) | ORAL | 0 refills | Status: DC

## 2023-09-16 NOTE — Patient Instructions (Signed)
Follow up appointment: 2 months - I will call you in early December to schedule that appointment  Go to the lab today to check kidney function and potassium  Take your BP meds as follows:  Continue with spironolactone 50 mg once daily  Check your blood pressure at home daily (if able) and keep record of the readings.  Hypertension "High blood pressure"  Hypertension is often called "The Silent Killer." It rarely causes symptoms until it is extremely  high or has done damage to other organs in the body. For this reason, you should have your  blood pressure checked regularly by your physician. We will check your blood pressure  every time you see a provider at one of our offices.   Your blood pressure reading consists of two numbers. Ideally, blood pressure should be  below 120/80. The first ("top") number is called the systolic pressure. It measures the  pressure in your arteries as your heart beats. The second ("bottom") number is called the diastolic pressure. It measures the pressure in your arteries as the heart relaxes between beats.  The benefits of getting your blood pressure under control are enormous. A 10-point  reduction in systolic blood pressure can reduce your risk of stroke by 27% and heart failure by 28%  Your blood pressure goal is < 130/80  To check your pressure at home you will need to:  1. Sit up in a chair, with feet flat on the floor and back supported. Do not cross your ankles or legs. 2. Rest your left arm so that the cuff is about heart level. If the cuff goes on your upper arm,  then just relax the arm on the table, arm of the chair or your lap. If you have a wrist cuff, we  suggest relaxing your wrist against your chest (think of it as Pledging the Flag with the  wrong arm).  3. Place the cuff snugly around your arm, about 1 inch above the crook of your elbow. The  cords should be inside the groove of your elbow.  4. Sit quietly, with the cuff in  place, for about 5 minutes. After that 5 minutes press the power  button to start a reading. 5. Do not talk or move while the reading is taking place.  6. Record your readings on a sheet of paper. Although most cuffs have a memory, it is often  easier to see a pattern developing when the numbers are all in front of you.  7. You can repeat the reading after 1-3 minutes if it is recommended  Make sure your bladder is empty and you have not had caffeine or tobacco within the last 30 min  Always bring your blood pressure log with you to your appointments. If you have not brought your monitor in to be double checked for accuracy, please bring it to your next appointment.  You can find a list of quality blood pressure cuffs at [email or web address]

## 2023-09-16 NOTE — Progress Notes (Unsigned)
09/17/2023 Brilyn Phon 1976/06/06 [id number]   HPI:  Lakenya Tetro is a 47 y.o. female patient of Dr Duke Salvia, with a PMH below who presents today for advanced hypertension clinic follow up.  Patient was referred to our clinic by Dr Alvis Lemmings in early 2022 for hypertension associated with multiple medication intolerances.  She had moved to [place] from the [place] and states that all of her current problems began about 6 years ago (in [place]) when she was put on a cholesterol medication and 2 blood pressure medications, all at the same time.  She developed severe leg pains that at times left her unable to walk.  She also developed facial swelling that comes and goes, she believes in relation to her BP readings.  She states that she continued on statin drugs for most of that time and nobody associated it with her leg pains, and once she finally stopped, the pains decreased by 60-70%.  She has been started on multiple medications in different drug classes, but continues to note side effects, mostly leg pain with and/or facial swelling.  She believed the leg pains were due to the medications "tightening and relaxing the blood vessels" and asked for a medication that would not do this.  She ultimately chose to work on lifestyle modifications, including transitioning to a plant based diet.    I last saw Ms Torner in February, at which time her pressure was 128/84 and she was continuing her lifestyle modifications, including switching to a plant based diet.  Most recently she was seen by Gillian Shields last month, and found to have a pressure of 170/110.  She was encouraged to start spironolactone 25 mg daily and scheduled for follow up.     Today she returns for follow up.  She started the spironolactone, then her PCP increased to 2 tabs about 10 days ago.  So far tolerating well, although complains that is causes occasional numbness/tingling in her left arm.  She was on Ozempic for her DM, but  took break from the medication, as it took her appetite away and she lost more weight than she wanted.  Switched to metformin, but her blood sugars have started to go back up.     Past Medical History: HLD 10/23  LDL 51 - on Repatha  DM2 8/22  A1c 7.3 - on Ozempic 1 mg, not significant weight loss ~6-8 pounds  CAD Aortic atherosclerosis  fibromyalgia   Tobacco abuse States has tried to quit multiple times, down to 4 cigarettes/day     Blood Pressure Goal:  130/80  Current Medications:  none  Family Hx:   father has hypertension, no heart disease; mother has had multiple cancers; doesn't know much about siblings health, kids 23/28/21 - no health issues  Social Hx:    smokes 4 cigarettes/day- had one this morning; over the past month has cut back from 1 ppd, comes and goes; only occasional alcohol; no coffee or tea regularly,   Diet:   has stopped eating out, moved towards a plant based diet.  Still eating meat once or twice weekly, but has cut back on carbs like pastas, rice, potatoes.  More fruits and vegetables on a daily basis.  Occasional beans or eggs, but not eating much protein overall.    Had to go back to protein because of DM dx; variety, watching sugar, so experimenting with foods.   Exercise: stretches, yoga pilates You-Tube videos; exercises from therapy; walk, bouncing ball  Home BP readings:  machine many years old - arm cuff   AM 18 readings average 143/88 HR 74  (range 129-168/80-97)  PM 10 readings average 142/93 HR 75  (range 115-171/78-99)  Intolerances:  atorvastatin, rosuvastatin - joint pains  Lisinopril  - cough  Hydralazine - headache, hot flashes  Amlodipine - leg cramps, facial swelling  Irbesartan - leg cramps, facial swelling  hctz  Candesartan -   Labs: 10/23:  Na 139, K 4.0, Glu 99, BUN 14, SCr 0.89, GFR 81   Wt Readings from Last 3 Encounters:  09/16/23 165 lb 6.4 oz (75 kg)  09/09/23 166 lb 12.8 oz (75.7 kg)  08/30/23 163 lb 9.6 oz (74.2 kg)    BP Readings from Last 3 Encounters:  09/16/23 (!) 152/98  09/09/23 136/80  08/30/23 (!) 157/96   Pulse Readings from Last 3 Encounters:  09/16/23 74  09/09/23 74  08/30/23 74    Current Outpatient Medications  Medication Sig Dispense Refill   acyclovir (ZOVIRAX) 400 MG tablet TAKE 1 TABLET BY MOUTH TWICE  DAILY 180 tablet 1   albuterol (PROAIR HFA) 108 (90 Base) MCG/ACT inhaler Inhale 2 puffs into the lungs every 4 (four) hours as needed for wheezing or shortness of breath. 3 Inhaler 0   spironolactone (ALDACTONE) 50 MG tablet Take 1 tablet (50 mg total) by mouth daily. 90 tablet 3   Blood Glucose Monitoring Suppl (ACCU-CHEK AVIVA PLUS) w/Device KIT USE AS DIRECTED DAILY. E11.9 1 kit 0   buPROPion (WELLBUTRIN XL) 150 MG 24 hr tablet Take 1 tablet (150 mg total) by mouth daily. (Patient not taking: Reported on 09/16/2023) 90 tablet 1   Continuous Blood Gluc Receiver (FREESTYLE LIBRE 2 READER) DEVI Use to check blood sugar three times daily. E11.49 1 each 0   Continuous Blood Gluc Sensor (FREESTYLE LIBRE 2 SENSOR) MISC Use to check blood sugar three times daily. Change sensors once every 14 days. E11.49 2 each 3   diazepam (VALIUM) 10 MG tablet Take 1 tablet one hour before procedure, repeat x 1 if needed. (Patient not taking: Reported on 08/30/2023) 2 tablet 0   fluconazole (DIFLUCAN) 150 MG tablet Take 1 and repeat in 3 days 2 tablet 0   glucose blood (FREESTYLE TEST STRIPS) test strip Use to check blood sugar three times daily. E11.49 100 each 3   hydrOXYzine (ATARAX) 25 MG tablet Take 25 mg by mouth 2 (two) times daily.     ibuprofen (ADVIL) 800 MG tablet TAKE 1 TABLET BY MOUTH EVERY 12  HOURS AS NEEDED 90 tablet 0   insulin glargine (LANTUS SOLOSTAR) 100 UNIT/ML Solostar Pen Inject 10 Units into the skin daily. 15 mL 6   Insulin Pen Needle 31G X 5 MM MISC 1 each by Does not apply route at bedtime. 30 each 5   Lancet Devices (ACCU-CHEK SOFTCLIX) lancets Use as instructed daily. 1 each  5   Lancets (FREESTYLE) lancets Use to check blood sugar three times daily. E11.49 100 each 3   metFORMIN (GLUCOPHAGE) 500 MG tablet Take 1 tablet (500 mg total) by mouth 2 (two) times daily with a meal. 180 tablet 1   methocarbamol (ROBAXIN) 750 MG tablet Take 1 tablet (750 mg total) by mouth every 8 (eight) hours as needed for muscle spasms. 90 tablet 3   Multiple Vitamin (MULTI-VITAMIN DAILY) TABS Take 1 tablet by mouth daily as needed.     nitrofurantoin, macrocrystal-monohydrate, (MACROBID) 100 MG capsule Take 1 capsule (100 mg total) by mouth 2 (two) times daily. (  Patient not taking: Reported on 08/30/2023) 14 capsule 0   omeprazole (PRILOSEC) 40 MG capsule TAKE 1 CAPSULE BY MOUTH EVERY  MORNING 100 capsule 0   pregabalin (LYRICA) 100 MG capsule TAKE 1 CAPSULE BY MOUTH TWICE  DAILY 60 capsule 5   rifaximin (XIFAXAN) 550 MG TABS tablet Take 1 tablet (550 mg total) by mouth 3 (three) times daily. 42 tablet 0   saccharomyces boulardii (FLORASTOR) 250 MG capsule Take 1 capsule (250 mg total) by mouth 2 (two) times daily. 60 capsule 0   Semaglutide, 2 MG/DOSE, (OZEMPIC, 2 MG/DOSE,) 8 MG/3ML SOPN INJECT SUBCUTANEOUSLY 2 MG EVERY WEEK AS DIRECTED 3 mL 0   terconazole (TERAZOL 7) 0.4 % vaginal cream Place 1 applicator vaginally at bedtime. (Patient not taking: Reported on 08/26/2023) 45 g 0   No current facility-administered medications for this visit.    Allergies  Allergen Reactions   Amlodipine     Leg pain, swelling around eyes    Atorvastatin Other (See Comments)     Joint pain   Crestor [Rosuvastatin]     Joint pain    Irbesartan     Leg pain, swelling around eyes   Metformin And Related Nausea And Vomiting   Metronidazole Nausea And Vomiting   Penicillins     childhood   Xanax [Alprazolam]     "Caused upper respiratory symptoms" per pt   Glyburide Other (See Comments)    "blood sugar dropped uncontrollably"   Linagliptin Diarrhea and Other (See Comments)    Stomach pain, sinus  infection   Moxifloxacin Diarrhea    Past Medical History:  Diagnosis Date   Allergy    Anxiety    Aortic atherosclerosis (HCC)    Arthritis    Chronic UTI    Clostridium difficile infection    Depression    Diabetes mellitus without complication (HCC)    Diverticulosis    Fibromyalgia    GERD (gastroesophageal reflux disease)    Hiatal hernia    Hyperlipidemia    Hypertension    Internal hemorrhoids    Meningitis, viral    Pancreatitis    Pure hypercholesterolemia 12/23/2020   Tubular adenoma of colon     Blood pressure (!) 152/98, pulse 74, height 5\' 4"  (1.626 m), weight 165 lb 6.4 oz (75 kg), last menstrual period 08/18/2023.   Benign essential hypertension Assessment: BP is uncontrolled in office BP 152/98 mmHg;  above the goal (<130/80). Just increased spironolactone to 50 mg about 10 days ago Tolerates well, does complain about left arm tingling - believes it is the medication Denies SOB, palpitation, chest pain, headaches,or swelling Reiterated the importance of regular exercise and low salt diet   Plan:  Continue taking spironolactone 50 mg once daily Patient to keep record of BP readings with heart rate and report to [place] at the next visit Patient to follow up with PharmD in 2 months  Labs ordered today:  BMET today   Phillips Hay PharmD CPP [facility] [address]  [place], [place] [address] [phone]

## 2023-09-17 ENCOUNTER — Encounter (HOSPITAL_BASED_OUTPATIENT_CLINIC_OR_DEPARTMENT_OTHER): Payer: Self-pay | Admitting: Pharmacist Clinician (PhC)/ Clinical Pharmacy Specialist

## 2023-09-17 NOTE — Assessment & Plan Note (Signed)
Assessment: BP is uncontrolled in office BP 152/98 mmHg;  above the goal (<130/80). Just increased spironolactone to 50 mg about 10 days ago Tolerates well, does complain about left arm tingling - believes it is the medication Denies SOB, palpitation, chest pain, headaches,or swelling Reiterated the importance of regular exercise and low salt diet   Plan:  Continue taking spironolactone 50 mg once daily Patient to keep record of BP readings with heart rate and report to [place] at the next visit Patient to follow up with PharmD in 2 months  Labs ordered today:  BMET today

## 2023-09-19 ENCOUNTER — Other Ambulatory Visit: Payer: Self-pay | Admitting: Family Medicine

## 2023-09-19 DIAGNOSIS — M79651 Pain in right thigh: Secondary | ICD-10-CM

## 2023-09-20 ENCOUNTER — Encounter: Payer: Self-pay | Admitting: Family Medicine

## 2023-09-20 ENCOUNTER — Other Ambulatory Visit (HOSPITAL_COMMUNITY)
Admission: RE | Admit: 2023-09-20 | Discharge: 2023-09-20 | Disposition: A | Discharge status: Home / Self Care | Payer: 59 | Source: Ambulatory Visit | Attending: Family Medicine | Admitting: Family Medicine

## 2023-09-20 ENCOUNTER — Ambulatory Visit: Payer: 59 | Attending: Family Medicine | Admitting: Family Medicine

## 2023-09-20 VITALS — BP 143/89 | HR 81 | Ht 64.0 in | Wt 164.6 lb

## 2023-09-20 DIAGNOSIS — Z794 Long term (current) use of insulin: Secondary | ICD-10-CM

## 2023-09-20 DIAGNOSIS — R399 Unspecified symptoms and signs involving the genitourinary system: Secondary | ICD-10-CM | POA: Diagnosis present

## 2023-09-20 DIAGNOSIS — E1149 Type 2 diabetes mellitus with other diabetic neurological complication: Secondary | ICD-10-CM | POA: Diagnosis not present

## 2023-09-20 DIAGNOSIS — T466X5A Adverse effect of antihyperlipidemic and antiarteriosclerotic drugs, initial encounter: Secondary | ICD-10-CM

## 2023-09-20 DIAGNOSIS — Z7984 Long term (current) use of oral hypoglycemic drugs: Secondary | ICD-10-CM | POA: Diagnosis not present

## 2023-09-20 DIAGNOSIS — G720 Drug-induced myopathy: Secondary | ICD-10-CM | POA: Diagnosis not present

## 2023-09-20 DIAGNOSIS — L600 Ingrowing nail: Secondary | ICD-10-CM

## 2023-09-20 LAB — POCT URINALYSIS DIP (CLINITEK)
Bilirubin, UA: NEGATIVE
Glucose, UA: NEGATIVE mg/dL
Ketones, POC UA: NEGATIVE mg/dL
Leukocytes, UA: NEGATIVE
Nitrite, UA: NEGATIVE
POC PROTEIN,UA: NEGATIVE
Spec Grav, UA: 1.025 (ref 1.010–1.025)
Urobilinogen, UA: 0.2 U/dL
pH, UA: 6 (ref 5.0–8.0)

## 2023-09-20 MED ORDER — LANTUS SOLOSTAR 100 UNIT/ML ~~LOC~~ SOPN: 25.0000 [IU] | PEN_INJECTOR | Freq: Every day | SUBCUTANEOUS | 6 refills | Status: DC

## 2023-09-20 NOTE — Progress Notes (Signed)
Subjective:  Patient ID: Amber Rose, female    DOB: Dec 01, 1975  Age: 47 y.o. MRN: [id number]  CC: Medical Management of Chronic Issues (Possible UTI/Ingrown toenail//)   HPI Amber Rose is a 47 y.o. year old female with a history of type 2 diabetes mellitus (A1c 9.2), hypertension, depression and anxiety, hydradenitis, tobacco abuse, fibromyalgia seen for a follow up visit.     Interval History: Discussed the use of AI scribe software for clinical note transcription with the patient, who gave verbal consent to proceed.  She presents with persistent urinary symptoms despite recent treatment. She describes a 'really, really, really strong heavy flow' with a 'bad odor and lots of bubbles.' She also reports a burning sensation at the end of urination, which she attributes to the pressure of the flow. She denies hematuria but reports flank pain. She has been drinking a 'ton of water.' She believes her UTI never fully resolved, despite previous treatment with antibiotics.  In addition to the urinary symptoms, she has been experiencing discomfort from what she believes are ingrown toenails on both feet, primarily the left. She noticed this about a week ago. She has not seen a podiatrist recently but reports that one pair of shoes has been causing discomfort.  The patient also mentions a recent diagnosis of immunodeficiency from an allergist, for which she will be receiving either antibiotic prophylactic treatment or intravenous immunoglobulin infusions. She has not yet contacted the endocrinologist whom she was referred to for an appointment regarding her diabetes, which has been poorly controlled with fluctuating sugars despite insulin and metformin therapy.      She was previously on progesterone and norethindrone by her OB/GYN for abnormal uterine bleed which has been discontinued.  Past Medical History:  Diagnosis Date   Allergy    Anxiety    Aortic atherosclerosis (HCC)     Arthritis    Chronic UTI    Clostridium difficile infection    Depression    Diabetes mellitus without complication (HCC)    Diverticulosis    Fibromyalgia    GERD (gastroesophageal reflux disease)    Hiatal hernia    Hyperlipidemia    Hypertension    Internal hemorrhoids    Meningitis, viral    Pancreatitis    Pure hypercholesterolemia 12/23/2020   Tubular adenoma of colon     Past Surgical History:  Procedure Laterality Date   APPENDECTOMY  1990   CESAREAN SECTION  2002   CHOLECYSTECTOMY  2011   COLONOSCOPY     UPPER GASTROINTESTINAL ENDOSCOPY     URINARY SURGERY     urethra sling, removal, and then revision 2016 (4 surgeries)   WISDOM TOOTH EXTRACTION      Family History  Problem Relation Age of Onset   Cancer Mother        cervical, breast, lungm skin, bladder-ex smoker   Hypertension Mother    Diabetes Father    Heart attack Maternal Grandfather    Multiple sclerosis Paternal Grandmother    Allergic rhinitis Neg Hx    Angioedema Neg Hx    Asthma Neg Hx    Eczema Neg Hx    Urticaria Neg Hx    Colon cancer Neg Hx    Esophageal cancer Neg Hx    Liver disease Neg Hx    Pancreatic cancer Neg Hx    Prostate cancer Neg Hx    Rectal cancer Neg Hx    Stomach cancer Neg Hx    Colon polyps Neg Hx  Social History   Socioeconomic History   Marital status: Single    Spouse name: Not on file   Number of children: 3   Years of education: Not on file   Highest education level: Not on file  Occupational History   Not on file  Tobacco Use   Smoking status: Every Day    Current packs/day: 0.10    Average packs/day: 0.1 packs/day for 20.0 years (2.0 ttl pk-yrs)    Types: Cigarettes   Smokeless tobacco: Never   Tobacco comments:    Patient is engaged in health coaching for smoking cessation as of 12/26/20  Vaping Use   Vaping status: Never Used  Substance and Sexual Activity   Alcohol use: Yes    Comment: occ   Drug use: Not Currently    Types: Marijuana    Sexual activity: Not Currently    Birth control/protection: Surgical    Comment: pt on her mentral cycle now began 11-14-17; BTL  Other Topics Concern   Not on file  Social History Narrative   Lives home with adult niece.  Not working.  Disability pending.  Pt is single.  Education GED.  3 children.    Social Determinants of Health   Financial Resource Strain: Medium Risk (09/20/2023)   Overall Financial Resource Strain (CARDIA)    Difficulty of Paying Living Expenses: Somewhat hard  Food Insecurity: Food Insecurity Present (09/20/2023)   Hunger Vital Sign    Worried About Running Out of Food in the Last Year: Sometimes true    Ran Out of Food in the Last Year: Sometimes true  Transportation Needs: No Transportation Needs (09/20/2023)   PRAPARE - Administrator, Civil Service (Medical): No    Lack of Transportation (Non-Medical): No  Physical Activity: Insufficiently Active (09/20/2023)   Exercise Vital Sign    Days of Exercise per Week: 3 days    Minutes of Exercise per Session: 30 min  Stress: Stress Concern Present (09/20/2023)   [employer] of Occupational Health - Occupational Stress Questionnaire    Feeling of Stress : To some extent  Social Connections: Socially Isolated (09/20/2023)   Social Connection and Isolation Panel [NHANES]    Frequency of Communication with Friends and Family: Three times a week    Frequency of Social Gatherings with Friends and Family: Once a week    Attends Religious Services: Never    Database administrator or Organizations: No    Attends Engineer, structural: Never    Marital Status: Never married    Allergies  Allergen Reactions   Amlodipine     Leg pain, swelling around eyes    Atorvastatin Other (See Comments)     Joint pain   Crestor [Rosuvastatin]     Joint pain    Irbesartan     Leg pain, swelling around eyes   Metformin And Related Nausea And Vomiting   Metronidazole Nausea And Vomiting    Penicillins     childhood   Xanax [Alprazolam]     "Caused upper respiratory symptoms" per pt   Glyburide Other (See Comments)    "blood sugar dropped uncontrollably"   Linagliptin Diarrhea and Other (See Comments)    Stomach pain, sinus infection   Moxifloxacin Diarrhea    Outpatient Medications Prior to Visit  Medication Sig Dispense Refill   acyclovir (ZOVIRAX) 400 MG tablet TAKE 1 TABLET BY MOUTH TWICE  DAILY 180 tablet 1   albuterol (PROAIR HFA) 108 (90 Base) MCG/ACT  inhaler Inhale 2 puffs into the lungs every 4 (four) hours as needed for wheezing or shortness of breath. 3 Inhaler 0   Blood Glucose Monitoring Suppl (ACCU-CHEK AVIVA PLUS) w/Device KIT USE AS DIRECTED DAILY. E11.9 1 kit 0   buPROPion (WELLBUTRIN XL) 150 MG 24 hr tablet Take 1 tablet (150 mg total) by mouth daily. (Patient not taking: Reported on 09/16/2023) 90 tablet 1   Continuous Blood Gluc Receiver (FREESTYLE LIBRE 2 READER) DEVI Use to check blood sugar three times daily. E11.49 1 each 0   Continuous Blood Gluc Sensor (FREESTYLE LIBRE 2 SENSOR) MISC Use to check blood sugar three times daily. Change sensors once every 14 days. E11.49 2 each 3   diazepam (VALIUM) 10 MG tablet Take 1 tablet one hour before procedure, repeat x 1 if needed. (Patient not taking: Reported on 08/30/2023) 2 tablet 0   fluconazole (DIFLUCAN) 150 MG tablet Take 1 and repeat in 3 days 2 tablet 0   glucose blood (FREESTYLE TEST STRIPS) test strip Use to check blood sugar three times daily. E11.49 100 each 3   hydrOXYzine (ATARAX) 25 MG tablet Take 25 mg by mouth 2 (two) times daily.     ibuprofen (ADVIL) 800 MG tablet TAKE 1 TABLET BY MOUTH EVERY 12  HOURS AS NEEDED 90 tablet 0   Insulin Pen Needle 31G X 5 MM MISC 1 each by Does not apply route at bedtime. 30 each 5   Lancet Devices (ACCU-CHEK SOFTCLIX) lancets Use as instructed daily. 1 each 5   Lancets (FREESTYLE) lancets Use to check blood sugar three times daily. E11.49 100 each 3    metFORMIN (GLUCOPHAGE) 500 MG tablet Take 1 tablet (500 mg total) by mouth 2 (two) times daily with a meal. 180 tablet 1   methocarbamol (ROBAXIN) 750 MG tablet Take 1 tablet (750 mg total) by mouth every 8 (eight) hours as needed for muscle spasms. 90 tablet 3   Multiple Vitamin (MULTI-VITAMIN DAILY) TABS Take 1 tablet by mouth daily as needed.     nitrofurantoin, macrocrystal-monohydrate, (MACROBID) 100 MG capsule Take 1 capsule (100 mg total) by mouth 2 (two) times daily. (Patient not taking: Reported on 08/30/2023) 14 capsule 0   omeprazole (PRILOSEC) 40 MG capsule TAKE 1 CAPSULE BY MOUTH EVERY  MORNING 100 capsule 0   pregabalin (LYRICA) 100 MG capsule TAKE 1 CAPSULE BY MOUTH TWICE  DAILY 60 capsule 5   rifaximin (XIFAXAN) 550 MG TABS tablet Take 1 tablet (550 mg total) by mouth 3 (three) times daily. 42 tablet 0   saccharomyces boulardii (FLORASTOR) 250 MG capsule Take 1 capsule (250 mg total) by mouth 2 (two) times daily. 60 capsule 0   Semaglutide, 2 MG/DOSE, (OZEMPIC, 2 MG/DOSE,) 8 MG/3ML SOPN INJECT SUBCUTANEOUSLY 2 MG EVERY WEEK AS DIRECTED 3 mL 0   spironolactone (ALDACTONE) 50 MG tablet Take 1 tablet (50 mg total) by mouth daily. 90 tablet 3   terconazole (TERAZOL 7) 0.4 % vaginal cream Place 1 applicator vaginally at bedtime. (Patient not taking: Reported on 08/26/2023) 45 g 0   insulin glargine (LANTUS SOLOSTAR) 100 UNIT/ML Solostar Pen Inject 10 Units into the skin daily. 15 mL 6   No facility-administered medications prior to visit.     ROS Review of Systems  Constitutional:  Negative for activity change and appetite change.  HENT:  Negative for sinus pressure and sore throat.   Respiratory:  Negative for chest tightness, shortness of breath and wheezing.   Cardiovascular:  Negative for  chest pain and palpitations.  Gastrointestinal:  Negative for abdominal distention, abdominal pain and constipation.  Genitourinary: Negative.   Musculoskeletal: Negative.    Psychiatric/Behavioral:  Negative for behavioral problems and dysphoric mood.     Objective:  BP (!) 143/89   Pulse 81   Ht 5\' 4"  (1.626 m)   Wt 164 lb 9.6 oz (74.7 kg)   LMP 08/18/2023   SpO2 99%   BMI 28.25 kg/m      09/20/2023   11:36 AM 09/20/2023   11:00 AM 09/16/2023   10:16 AM  BP/Weight  Systolic BP 143 151 152  Diastolic BP 89 93 98  Wt. (Lbs)  164.6 165.4  BMI  28.25 kg/m2 28.39 kg/m2      Physical Exam Constitutional:      Appearance: She is well-developed.  Cardiovascular:     Rate and Rhythm: Normal rate.     Heart sounds: Normal heart sounds. No murmur heard. Pulmonary:     Effort: Pulmonary effort is normal.     Breath sounds: Normal breath sounds. No wheezing or rales.  Chest:     Chest wall: No tenderness.  Abdominal:     General: Bowel sounds are normal. There is no distension.     Palpations: Abdomen is soft. There is no mass.     Tenderness: There is no abdominal tenderness.  Musculoskeletal:     Right lower leg: No edema.     Left lower leg: No edema.     Comments: No evidence of infection in toes.  Neurological:     Mental Status: She is alert and oriented to person, place, and time.  Psychiatric:        Mood and Affect: Mood normal.        Latest Ref Rng & Units 09/16/2023   10:35 AM 08/30/2023   10:52 AM 08/13/2023    9:21 AM  CMP  Glucose 70 - 99 mg/dL 469  629  528   BUN 6 - 24 mg/dL 19  21  21    Creatinine 0.57 - 1.00 mg/dL 4.13  2.44  0.10   Sodium 134 - 144 mmol/L 139  134  135   Potassium 3.5 - 5.2 mmol/L 4.5  4.8  4.9   Chloride 96 - 106 mmol/L 103  101  101   CO2 20 - 29 mmol/L 21  28  27    Calcium 8.7 - 10.2 mg/dL 9.7  9.8  9.5   Total Protein 6.5 - 8.1 g/dL  7.5    Total Bilirubin <1.2 mg/dL  0.6    Alkaline Phos 38 - 126 U/L  59    AST 15 - 41 U/L  13    ALT 0 - 44 U/L  14      Lipid Panel     Component Value Date/Time   CHOL 213 (H) 12/01/2022 0955   TRIG 105 12/01/2022 0955   HDL 43 12/01/2022 0955    CHOLHDL 5.0 (H) 12/01/2022 0955   CHOLHDL 4.4 10/28/2016 0851   LDLCALC 151 (H) 12/01/2022 0955   LDLDIRECT 51 08/14/2022 1431    CBC    Component Value Date/Time   WBC 11.7 (H) 08/30/2023 1052   RBC 4.92 08/30/2023 1052   HGB 15.8 (H) 08/30/2023 1052   HGB 13.9 06/02/2023 1117   HGB 10.8 (L) 02/05/2023 1147   HCT 45.2 08/30/2023 1052   HCT 33.9 (L) 02/05/2023 1147   PLT 307 08/30/2023 1052   PLT 279 06/02/2023 1117  PLT 312 02/05/2023 1147   MCV 91.9 08/30/2023 1052   MCV 89 02/05/2023 1147   MCH 32.1 08/30/2023 1052   MCHC 35.0 08/30/2023 1052   RDW 14.4 08/30/2023 1052   RDW 14.8 02/05/2023 1147   LYMPHSABS 2.6 08/30/2023 1052   LYMPHSABS 2.0 02/05/2023 1147   MONOABS 0.6 08/30/2023 1052   EOSABS 0.1 08/30/2023 1052   EOSABS 0.0 02/05/2023 1147   BASOSABS 0.0 08/30/2023 1052   BASOSABS 0.0 02/05/2023 1147    Lab Results  Component Value Date   HGBA1C 9.2 (H) 08/04/2023    Assessment & Plan:      Urinary Symptoms Persistent urinary symptoms despite recent treatment for UTI. Urinalysis shows no evidence of current infection but presence of blood. Flank pain reported. -Order vaginal swab to evaluate for BV versus Diflucan   ingrown Toenails Painful ingrown toenails on both feet, more severe on the left. No signs of infection. -Referral to podiatry for further evaluation and treatment. -Advise warm soaks with salt water twice daily until appointment.  Uncontrolled Diabetes Persistent hyperglycemia despite current regimen of insulin and metformin. Patient reports fluctuating blood glucose levels, with fasting levels still above target range. -Increase Lantus dose every fourth day as needed to achieve target fasting blood glucose levels (80-120 mg/dL). -Order refill of Lantus with additional units to allow for dose titration. -Previously referred to endocrinology and she has been advised to contact them to schedule an appointment.  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 Patient to start intravenous immunoglobulin therapy. -Continue follow-up with allergist for management of immunodeficiency.          Meds ordered this encounter  Medications   insulin glargine (LANTUS SOLOSTAR) 100 UNIT/ML Solostar Pen    Sig: Inject 25 Units into the skin daily. Increase by 2 units to a maximum daily dose of 30 units every 4th day until blood sugars are at goal    Dispense:  15 mL    Refill:  6    Follow-up: Return in about 3 months (around 12/21/2023) for Chronic medical conditions.       Hoy Register, MD, FAAFP. [facility] [place], [place] [phone]   09/20/2023, 12:29 PM

## 2023-09-20 NOTE — Patient Instructions (Signed)
VISIT SUMMARY:  During today's visit, we discussed your persistent urinary symptoms, discomfort from ingrown toenails, uncontrolled diabetes, and recent diagnosis of immunodeficiency. We have outlined a plan to address each of these issues to help improve your overall health.  YOUR PLAN:  -URINARY SYMPTOMS: You are experiencing persistent urinary symptoms despite recent treatment for a urinary tract infection (UTI). A urine test showed no current infection but did show blood. We will order a urine culture to investigate further. We will discuss the results on 09/27/2023 or 09/29/2023.  -INGROWN TOENAILS: You have painful ingrown toenails on both feet, especially the left. There are no signs of infection. We will refer you to a podiatrist for further evaluation and treatment. In the meantime, please soak your feet in warm salt water twice daily.  -UNCONTROLLED DIABETES: Your blood sugar levels are still high despite taking insulin and metformin. We will increase your Lantus dose to help achieve your target fasting blood glucose levels (80-120 mg/dL). We will also refill your Lantus prescription with additional units for dose adjustment. An urgent referral to an endocrinologist will be made for further management.  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 You will follow-up with your allergist for management.  INSTRUCTIONS:  Please follow up for the urine culture results on 09/27/2023 or 09/29/2023. Additionally, make sure to schedule and attend your appointments with the podiatrist and endocrinologist as soon as possible.

## 2023-09-21 ENCOUNTER — Encounter: Payer: Self-pay | Admitting: Allergy & Immunology

## 2023-09-21 ENCOUNTER — Ambulatory Visit (INDEPENDENT_AMBULATORY_CARE_PROVIDER_SITE_OTHER): Payer: 59 | Admitting: Allergy & Immunology

## 2023-09-21 DIAGNOSIS — J302 Other seasonal allergic rhinitis: Secondary | ICD-10-CM | POA: Diagnosis not present

## 2023-09-21 DIAGNOSIS — D806 Antibody deficiency with near-normal immunoglobulins or with hyperimmunoglobulinemia: Secondary | ICD-10-CM

## 2023-09-21 DIAGNOSIS — L732 Hidradenitis suppurativa: Secondary | ICD-10-CM

## 2023-09-21 DIAGNOSIS — T783XXD Angioneurotic edema, subsequent encounter: Secondary | ICD-10-CM

## 2023-09-21 LAB — CERVICOVAGINAL ANCILLARY ONLY
Bacterial Vaginitis (gardnerella): NEGATIVE
Candida Glabrata: NEGATIVE
Candida Vaginitis: NEGATIVE
Chlamydia: NEGATIVE
Comment: NEGATIVE
Comment: NEGATIVE
Comment: NEGATIVE
Comment: NEGATIVE
Comment: NEGATIVE
Comment: NORMAL
Neisseria Gonorrhea: NEGATIVE
Trichomonas: NEGATIVE

## 2023-09-21 MED ORDER — HYDROXYZINE HCL 50 MG PO TABS: 50.0000 mg | ORAL_TABLET | Freq: Three times a day (TID) | ORAL | 5 refills | Status: AC | PRN

## 2023-09-21 NOTE — Addendum Note (Signed)
Addended by: Briant Cedar L on: 09/21/2023 11:55 AM   Modules accepted: Orders

## 2023-09-21 NOTE — Progress Notes (Signed)
FOLLOW UP  Date of Service/Encounter:  09/21/23   Assessment:   Chronic rhinitis   Angioedema - pending food allergy workup at the next visit   Recurrent infections -  with inadequate response to Streptococcus pneumonia in the setting of recurrent sinusitis   Hidradenitis suppurativa - scheduled to see dermatology in February 2025   Complicated past medical history, including fibromyalgia, type 2 diabetes, elevated blood pressure    Testing today was unrevealing.  She does have some sensitization to some outdoor molds, but I do not think that this is relevant to her clinical state including the swelling.  Her allergic rhinitis was not a terribly debilitating symptom, so we did not do intradermal testing today.  We did spend some time discussing her labs including her inadequate protection against Streptococcus pneumonia.  She wants to proceed with initiation of IVIG, so we will see if her insurance will cover that.  She talked with her PCP and they do not feel that prophylactic antibiotics would be a good idea given her multiple medication allergies.  Plan/Recommendations:   1. Chronic rhinitis - Testing today showed: outdoor molds - Copy of test results provided.  - We did not do the more sensitive intradermal testing since symptoms were not too severe.  - Continue with: an antihistamine daily as you are doing - You can use an extra dose of the antihistamine, if needed, for breakthrough symptoms.  - Consider nasal saline rinses 1-2 times daily to remove allergens from the nasal cavities as well as help with mucous clearance (this is especially helpful to do before the nasal sprays are given)  2. Angioedema - with concern for food allergies - We will do food testing at the next visit.   3. Recurrent infections - with inadequate protection against Streptococcus pneumoniae - You did not have good protection against Streptococcus pneumoniae.  - We will work on getting these  approved.  - Tammy will reach out to get this approved.  - We do IVIG in the infusion center.  - We will do testing for genetic causes of immunodeficiency at the next visit since we have a holiday this week.   4. Hiadrenitis suppurativa  - You are scheduled to see Dermatology in February, so that is good news.   5. Return in about ONE WEEK FOR FOOD TESTING (1-72).  Subjective:   Amber Rose is a 47 y.o. female presenting today for follow up of No chief complaint on file.   Amber Rose has a history of the following: Patient Active Problem List   Diagnosis Date Noted   Yeast vaginitis 08/30/2023   Iron deficiency anemia 03/11/2023   PMDD (premenstrual dysphoric disorder) 11/17/2022   Abnormal uterine bleeding (AUB) 11/17/2022   Statin myopathy 09/23/2022   Steatorrhea 08/17/2022   Irregular menses 11/28/2021   Perimenopause 11/28/2021   Episode of recurrent major depressive disorder (HCC) 04/09/2021   BMI 33.0-33.9,adult 04/09/2021   Pure hypercholesterolemia 12/23/2020   Menorrhagia with regular cycle 09/13/2020   Well woman exam 09/13/2020   History of herpes zoster 01/17/2018   Overactive bladder 12/28/2017   Recurrent infections 11/19/2017   Cyst of nasal cavity 11/19/2017   Angio-edema 11/19/2017   Abdominal pain, epigastric 11/04/2017   Dysphagia 11/04/2017   Gastroesophageal reflux disease 07/12/2017   Degenerative disc disease at L5-S1 level 05/14/2017   Fibromyalgia 05/14/2017   ADD (attention deficit disorder) 05/14/2017   Urinary incontinence 04/05/2017   Thigh pain 02/02/2017   Anxiety and depression 02/02/2017  Tobacco abuse 02/02/2017   Facial flushing 02/02/2017   Hypertension 10/23/2016   Hidradenitis 10/23/2016   Type II diabetes mellitus, uncontrolled 09/12/2014   Lupus anticoagulant positive 07/03/2014   Vitamin D deficiency 06/06/2014   Benign essential hypertension 06/06/2014    History obtained from: chart review and  patient.  Discussed the use of AI scribe software for clinical note transcription with the patient and/or guardian, who gave verbal consent to proceed.  Amber Rose is a 47 y.o. female presenting for skin testing. She was last seen on November 14. We could not do testing because her insurance company does not cover testing on the same day as a New Patient visit. She has been off of all antihistamines 3 days in anticipation of the testing.   We obtained a slew of labs.  This was to look at her immune system.  Her IgA was elevated, but otherwise for immunoglobulins were normal.  Tetanus and diphtheria were protective.  Complement activity was normal.  She was only protective to 6 out of 23 serotypes of Streptococcus pneumonia which was less than we tested her several years ago.  We talked about doing a prophylactic antibiotic versus immunoglobulin infusions.   ANA screen to look for autoimmune disease was negative.  Inflammatory markers were normal.  Tryptase was negative.  Rheumatoid factor was negative.  Otherwise, there have been no changes to her past medical history, surgical history, family history, or social history.    Review of systems otherwise negative other than that mentioned in the HPI.    Objective:   Last menstrual period 08/18/2023. There is no height or weight on file to calculate BMI.    Physical exam deferred since this was a skin testing appointment only.   Diagnostic studies:   Allergy Studies:     Airborne Adult Perc - 09/21/23 1057     Time Antigen Placed 1040    Allergen Manufacturer Waynette Buttery    Location Back    Number of Test 53    1. Control-Buffer 50% Glycerol Negative    2. Control-Histamine 2+    3. Bahia Negative    4. French Southern Territories Negative    5. Johnson Negative    6. Kentucky Blue Negative    7. Meadow Fescue Negative    8. Perennial Rye Negative    9. Timothy Negative    10. Ragweed Mix Negative    11. Cocklebur Negative    12. Plantain,  English  Negative    13. Baccharis Negative    14. Dog Fennel Negative    15. Russian Thistle Negative    16. Lamb's Quarters Negative    17. Sheep Sorrell Negative    18. Rough Pigweed Negative    19. Marsh Elder, Rough Negative    20. Mugwort, Common Negative    21. Box, Elder Negative    22. Cedar, red Negative    23. Sweet Gum Negative    24. Pecan Pollen Negative    25. Pine Mix Negative    26. Walnut, Black Pollen Negative    27. Red Mulberry Negative    28. Ash Mix Negative    29. Birch Mix Negative    30. Beech American Negative    31. Cottonwood, Guinea-Bissau Negative    32. Hickory, White Negative    33. Maple Mix Negative    34. Oak, [place] Mix Negative    35. Sycamore Eastern Negative    36. Alternaria Alternata Negative    37. Cladosporium Herbarum Negative  38. Aspergillus Mix Negative    39. Penicillium Mix Negative    40. Bipolaris Sorokiniana (Helminthosporium) 2+    41. Drechslera Spicifera (Curvularia) Negative    42. Mucor Plumbeus Negative    43. Fusarium Moniliforme Negative    44. Aureobasidium Pullulans (pullulara) Negative    45. Rhizopus Oryzae Negative    46. Botrytis Cinera Negative    47. Epicoccum Nigrum Negative    48. Phoma Betae Negative    49. Dust Mite Mix Negative    50. Cat Hair 10,000 BAU/ml Negative    51.  Dog Epithelia Negative    52. Mixed Feathers Negative    53. Horse Epithelia Negative    54. Cockroach, German Negative    55. Tobacco Leaf Negative             Allergy testing results were read and interpreted by myself, documented by clinical staff.      Malachi Bonds, MD  [facility]

## 2023-09-21 NOTE — Patient Instructions (Addendum)
1. Chronic rhinitis - Testing today showed: outdoor molds - Copy of test results provided.  - We did not do the more sensitive intradermal testing since symptoms were not too severe.  - Continue with: an antihistamine daily as you are doing - You can use an extra dose of the antihistamine, if needed, for breakthrough symptoms.  - Consider nasal saline rinses 1-2 times daily to remove allergens from the nasal cavities as well as help with mucous clearance (this is especially helpful to do before the nasal sprays are given)  2. Angioedema - with concern for food allergies - We will do food testing at the next visit.   3. Recurrent infections - with inadequate protection against Streptococcus pneumoniae - You did not have good protection against Streptococcus pneumoniae.  - We will work on getting these approved.  - Tammy will reach out to get this approved.  - We do IVIG in the infusion center.  - We will do testing for genetic causes of immunodeficiency at the next visit since we have a holiday this week.   4. Hiadrenitis suppurativa  - You are scheduled to see Dermatology in February, so that is good news.   5. Return in about ONE WEEK FOR FOOD TESTING (1-72).   Please inform us of any Emergency Department visits, hospitalizations, or changes in symptoms. Call us before going to the ED for breathing or allergy symptoms since we might be able to fit you in for a sick visit. Feel free to contact us anytime with any questions, problems, or concerns.  It was a pleasure to see you again today!  Websites that have reliable patient information: 1. American Academy of Asthma, Allergy, and Immunology: www.aaaai.org 2. Food Allergy Research and Education (FARE): foodallergy.org 3. Mothers of Asthmatics: http://www.asthmacommunitynetwork.org 4. American College of Allergy, Asthma, and Immunology: www.acaai.org      "Like" [place] on Facebook and Instagram for our latest updates!      A  healthy democracy works best when [employer] participate! Make sure you are registered to vote! If you have moved or changed any of your contact information, you will need to get this updated before voting! Scan the QR codes below to learn more!        Airborne Adult Perc - 09/21/23 1057     Time Antigen Placed 1040    Allergen Manufacturer Waynette Buttery    Location Back    Number of Test 53    1. Control-Buffer 50% Glycerol Negative    2. Control-Histamine 2+    3. Bahia Negative    4. French Southern Territ[place] Negative    5. Johnson Negative    6. Kentucky Blue Negative    7. Meadow Fescue Negative    8. Perennial Rye Negative    9. Timothy Negative    10. Ragweed Mix Negative    11. Cocklebur Negative    12. Plantain,  English Negative    13. Baccharis Negative    14. Dog Fennel Negative    15. Russian Thistle Negative    16. Lamb's Quarters Negative    17. Sheep Sorrell Negative    18. Rough Pigweed Negative    19. Marsh Elder, Rough Negative    20. Mugwort, Common Negative    21. Box, Elder Negative    22. Cedar, red Negative    23. Sweet Gum Negative    24. Pecan Pollen Negative    25. Pine Mix Negative    26. Walnut, Black Pollen Negative  27. Red Mulberry Negative    28. Ash Mix Negative    29. Birch Mix Negative    30. Beech American Negative    31. Cottonwood, Guinea-Bissau Negative    32. Hickory, White Negative    33. Maple Mix Negative    34. Oak, [place] Mix Negative    35. Sycamore Eastern Negative    36. Alternaria Alternata Negative    37. Cladosporium Herbarum Negative    38. Aspergillus Mix Negative    39. Penicillium Mix Negative    40. Bipolaris Sorokiniana (Helminthosporium) 2+    41. Drechslera Spicifera (Curvularia) Negative    42. Mucor Plumbeus Negative    43. Fusarium Moniliforme Negative    44. Aureobasidium Pullulans (pullulara) Negative    45. Rhizopus Oryzae Negative    46. Botrytis Cinera Negative    47. Epicoccum Nigrum Negative    48. Phoma Betae  Negative    49. Dust Mite Mix Negative    50. Cat Hair 10,000 BAU/ml Negative    51.  Dog Epithelia Negative    52. Mixed Feathers Negative    53. Horse Epithelia Negative    54. Cockroach, German Negative    55. Tobacco Leaf Negative             Control of Mold Allergen   Mold and fungi can grow on a variety of surfaces provided certain temperature and moisture conditions exist.  Outdoor molds grow on plants, decaying vegetation and soil.  The major outdoor mold, Alternaria and Cladosporium, are found in very high numbers during hot and dry conditions.  Generally, a late Summer - Fall peak is seen for common outdoor fungal spores.  Rain will temporarily lower outdoor mold spore count, but counts rise rapidly when the rainy period ends.  The most important indoor molds are Aspergillus and Penicillium.  Dark, humid and poorly ventilated basements are ideal sites for mold growth.  The next most common sites of mold growth are the bathroom and the kitchen.  Outdoor (Seasonal) Mold Control   Use air conditioning and keep windows closed Avoid exposure to decaying vegetation. Avoid leaf raking. Avoid grain handling. Consider wearing a face mask if working in moldy areas.

## 2023-09-22 ENCOUNTER — Other Ambulatory Visit: Payer: Self-pay | Admitting: Family Medicine

## 2023-09-22 DIAGNOSIS — E1149 Type 2 diabetes mellitus with other diabetic neurological complication: Secondary | ICD-10-CM

## 2023-09-24 ENCOUNTER — Other Ambulatory Visit (HOSPITAL_COMMUNITY): Payer: Self-pay

## 2023-09-24 ENCOUNTER — Telehealth: Payer: Self-pay | Admitting: Pharmacy Technician

## 2023-09-24 NOTE — Telephone Encounter (Signed)
Pharmacy Patient Advocate Encounter   Received notification from CoverMyMeds that prior authorization for XIFAXAN 550MG  is required/requested.   Insurance verification completed.   The patient is insured through [facility] .   Per test claim: PA required; PA submitted to above mentioned insurance via CoverMyMeds Key/confirmation #/EOC [id number] Status is pending   PT HAS TRIED IMODIUM (LOPERAMIDE)

## 2023-09-27 ENCOUNTER — Telehealth: Payer: Self-pay | Admitting: Family Medicine

## 2023-09-27 ENCOUNTER — Ambulatory Visit: Payer: 59 | Admitting: Podiatry

## 2023-09-27 NOTE — Telephone Encounter (Signed)
Osborne Casco from [facility] called stated they need patients demo sheet so they are able to contact patient. Please fax to [phone].

## 2023-09-27 NOTE — Telephone Encounter (Signed)
Call to [facility] . Spoke with [place] patient contact information given.

## 2023-09-28 ENCOUNTER — Telehealth: Payer: Self-pay | Admitting: *Deleted

## 2023-09-28 NOTE — Telephone Encounter (Signed)
-----   Message from Alfonse Spruce sent at 09/21/2023 11:29 AM EST ----- Patient wants to pursue IVIG. She already gets iron infusions at the [facility]. Unsure if we could coordinate to take place there. Let's do 400mg /kg/month.   Malachi Bonds, MD [facility]

## 2023-09-28 NOTE — Telephone Encounter (Signed)
Called and l/m for Dr Al Pimple nurse if we can get her infusions at [facility]. Advised patient I would check  with them then let her know

## 2023-09-29 ENCOUNTER — Telehealth: Payer: Self-pay

## 2023-09-29 NOTE — Telephone Encounter (Signed)
Copied from CRM [id number]. Topic: General - Other >> Sep 29, 2023 10:57 AM Marlow Baars wrote: Reason for CRM: Amber Rose with [facility] called in to inform they do not accept medicaid even if it is a secondary or supplemental insurance. Please assist patient further

## 2023-09-30 ENCOUNTER — Encounter: Payer: Self-pay | Admitting: Allergy & Immunology

## 2023-09-30 ENCOUNTER — Ambulatory Visit (INDEPENDENT_AMBULATORY_CARE_PROVIDER_SITE_OTHER): Payer: 59 | Admitting: Allergy & Immunology

## 2023-09-30 DIAGNOSIS — D806 Antibody deficiency with near-normal immunoglobulins or with hyperimmunoglobulinemia: Secondary | ICD-10-CM

## 2023-09-30 DIAGNOSIS — T783XXD Angioneurotic edema, subsequent encounter: Secondary | ICD-10-CM

## 2023-09-30 DIAGNOSIS — L732 Hidradenitis suppurativa: Secondary | ICD-10-CM

## 2023-09-30 NOTE — Telephone Encounter (Signed)
Can we send her endocrinology referral to another location.

## 2023-09-30 NOTE — Progress Notes (Signed)
FOLLOW UP  Date of Service/Encounter:  09/30/23   Assessment:   Chronic rhinitis - with sensitization to outdoor molds only   Angioedema - with negative testing to the entire food panel   Recurrent infections - with protection to only 6 out of 23 serotypes of Streptococcus pneumonia (initiating immunoglobulin treatments   Hidradenitis    Complicated past medical history, including fibromyalgia, type 2 diabetes, elevated blood pressure   Plan/Recommendations:   1. Chronic rhinitis - Previous testing showed: outdoor molds - Continue with: an antihistamine daily as you are doing - You can use an extra dose of the antihistamine, if needed, for breakthrough symptoms.  - Consider nasal saline rinses 1-2 times daily to remove allergens from the nasal cavities as well as help with mucous clearance (this is especially helpful to do before the nasal sprays are given)  2. Angioedema - with concern for food allergies - Testing was negative to the entire panel. - Copy of testing results provided.  - There is a the low positive predictive value of food allergy testing and hence the high possibility of false positives. - In contrast, food allergy testing has a high negative predictive value, therefore if testing is negative we can be relatively assured that they are indeed negative.  3. Recurrent infections - with inadequate protection against Streptococcus pneumoniae - You did not have good protection against Streptococcus pneumoniae.  - I talked to Amber Rose and she is trying to get it approved through the cancer center.  - We are going to go with a formulation that does not have a sugar stabilizer.  - Genetic testing collected today.  - This takes 2-3 weeks to come back.   4. Hiadrenitis suppurativa  - You are scheduled to see Dermatology in February, so that is good news.   5. Return in three months or earlier if needed.   Subjective:   Amber Rose is a 47 y.o. female  presenting today for follow up of  Chief Complaint  Patient presents with   Allergy Testing    Adult Foods 1-72    Amber Rose has a history of the following: Patient Active Problem List   Diagnosis Date Noted   Yeast vaginitis 08/30/2023   Iron deficiency anemia 03/11/2023   PMDD (premenstrual dysphoric disorder) 11/17/2022   Abnormal uterine bleeding (AUB) 11/17/2022   Statin myopathy 09/23/2022   Steatorrhea 08/17/2022   Irregular menses 11/28/2021   Perimenopause 11/28/2021   Episode of recurrent major depressive disorder (HCC) 04/09/2021   BMI 33.0-33.9,adult 04/09/2021   Pure hypercholesterolemia 12/23/2020   Menorrhagia with regular cycle 09/13/2020   Well woman exam 09/13/2020   History of herpes zoster 01/17/2018   Overactive bladder 12/28/2017   Recurrent infections 11/19/2017   Cyst of nasal cavity 11/19/2017   Angio-edema 11/19/2017   Abdominal pain, epigastric 11/04/2017   Dysphagia 11/04/2017   Gastroesophageal reflux disease 07/12/2017   Degenerative disc disease at L5-S1 level 05/14/2017   Fibromyalgia 05/14/2017   ADD (attention deficit disorder) 05/14/2017   Urinary incontinence 04/05/2017   Thigh pain 02/02/2017   Anxiety and depression 02/02/2017   Tobacco abuse 02/02/2017   Facial flushing 02/02/2017   Hypertension 10/23/2016   Hidradenitis 10/23/2016   Type II diabetes mellitus, uncontrolled 09/12/2014   Lupus anticoagulant positive 07/03/2014   Vitamin D deficiency 06/06/2014   Benign essential hypertension 06/06/2014    History obtained from: chart review and patient.  Discussed the use of AI scribe software for clinical note transcription with  the patient and/or guardian, who gave verbal consent to proceed.  Amber Rose is a 47 y.o. female presenting for skin testing.  She has a complicated past medical history including angioedema and specific antibody deficiency.  We did testing a week ago and she was positive only to 1 outdoor mold.   We did not do intradermal testing since her allergic rhinitis symptoms were not severe.  We did talk about doing immunoglobulin infusions to treat her specific antibody deficiency and she wanted to go ahead and start this.  We also planned to do genetic testing due to her immune deficiency.  She presents today for testing to the food panel due to her history of angioedema.  We are also planning to get genetic testing as well.  We did not do it last week because it was a holiday week and we did not want the sample to degrade in shipping.  Otherwise, there have been no changes to her past medical history, surgical history, family history, or social history.    Review of systems otherwise negative other than that mentioned in the HPI.    Objective:   There were no vitals taken for this visit. There is no height or weight on file to calculate BMI.    Physical exam deferred since this was a skin testing appointment only.   Diagnostic studies:   Allergy Studies:     Food Adult Perc - 09/30/23 1000     Time Antigen Placed 1028    Allergen Manufacturer Waynette Buttery    Location Back    Number of allergen test 72     Control-buffer 50% Glycerol Negative    Control-Histamine 2+    1. Peanut Negative    2. Soybean Negative    3. Wheat Negative    4. Sesame Negative    5. Milk, Cow Negative    6. Casein Negative    7. Egg White, Chicken Negative    8. Shellfish Mix Negative    9. Fish Mix Negative    10. Cashew Negative    11. Walnut Food Negative    12. Almond Negative    13. Hazelnut Negative    14. Pecan Food Negative    15. Pistachio Negative    16. [place] Nut Negative    17. Coconut Negative    18. Trout Negative    19. Tuna Negative    20. Salmon Negative    21. Flounder Negative    22. Codfish Negative    23. Shrimp Negative    24. Crab Negative    25. Lobster Negative    26. Oyster Negative    27. Scallops Negative    28. Oat  Negative    29. Rice Negative    30.  Barley Negative    31. Rye  Negative    32. Hops Negative    33. [place] Meat Negative    34. Chicken Meat Negative    35. Pork Negative    36. Beef Negative    37. Lamb Negative    38. Tomato Negative    39. White Potato Negative    40. Sweet Potato Negative    41. Pea, Green/English Negative    42. Navy Bean Negative    43. Green Beans Negative    44. Squash Negative    45. Green Pepper Negative    46. Mushrooms Negative    47. Onion Negative    48. Avocado Negative    49. Cabbage Negative  50. Carrots Negative    51. Celery Negative    52. Corn Negative    53. Cucumber Negative    54. Grape (White seedless) Negative    55. Orange  Negative    56. Lemon Negative    57. Banana Negative    58. Apple Negative    59. Peach Negative    60. Strawberry Negative    61. Blueberry Negative    62. Cherry Negative    63. Cantaloupe Negative    64. Watermelon Negative    65. Pineapple Negative    66. Chocolate/Cacao Bean Negative    67. Cinnamon Negative    68. Nutmeg Negative    69. Ginger Negative    70. Garlic Negative    71. Pepper, Black Negative    72. Mustard Negative             Allergy testing results were read and interpreted by myself, documented by clinical staff.      Malachi Bonds, MD  [facility]

## 2023-09-30 NOTE — Patient Instructions (Addendum)
1. Chronic rhinitis - Previous testing showed: outdoor molds - Continue with: an antihistamine daily as you are doing - You can use an extra dose of the antihistamine, if needed, for breakthrough symptoms.  - Consider nasal saline rinses 1-2 times daily to remove allergens from the nasal cavities as well as help with mucous clearance (this is especially helpful to do before the nasal sprays are given)  2. Angioedema - with concern for food allergies - Testing was negative to the entire panel. - Copy of testing results provided.  - There is a the low positive predictive value of food allergy testing and hence the high possibility of false positives. - In contrast, food allergy testing has a high negative predictive value, therefore if testing is negative we can be relatively assured that they are indeed negative.  3. Recurrent infections - with inadequate protection against Streptococcus pneumoniae - You did not have good protection against Streptococcus pneumoniae.  - I talked to Tammy and she is trying to get it approved through the cancer center.  - We are going to go with a formulation that does not have a sugar stabilizer.  - Genetic testing collected today.  - This takes 2-3 weeks to come back.   4. Hiadrenitis suppurativa  - You are scheduled to see Dermatology in February, so that is good news.   5. Return in three months or earlier if needed.    Please inform us of any Emergency Department visits, hospitalizations, or changes in symptoms. Call us before going to the ED for breathing or allergy symptoms since we might be able to fit you in for a sick visit. Feel free to contact us anytime with any questions, problems, or concerns.  It was a pleasure to see you again today!  Websites that have reliable patient information: 1. American Academy of Asthma, Allergy, and Immunology: www.aaaai.org 2. Food Allergy Research and Education (FARE): foodallergy.org 3. Mothers of Asthmatics:  http://www.asthmacommunitynetwork.org 4. American College of Allergy, Asthma, and Immunology: www.acaai.org      "Like" [place] on Facebook and Instagram for our latest updates!      A healthy democracy works best when [employer] participate! Make sure you are registered to vote! If you have moved or changed any of your contact information, you will need to get this updated before voting! Scan the QR codes below to learn more!     Food Intolerance Versus Food Allergy  Food IntoleranceSome of the symptoms of food intolerance and food allergy are similar, but the differences between the two are very important. Eating a food you are intolerant to can leave you feeling miserable. However, if you have a true food allergy, your body's reaction to this food could be life-threatening.  Digestive system versus immune system  A food intolerance response takes place in the digestive system. It occurs when you are unable to properly breakdown the food. This could be due to enzyme deficiencies, sensitivity to food additives or reactions to naturally occurring chemicals in foods. Often, people can eat small amounts of the food without causing problems.  A food allergic reaction involves the immune system. Your immune system controls how your body defends itself. For instance, if you have an allergy to cow's milk, your immune system identifies cow's milk as an invader or allergen. Your immune system overreacts by producing antibodies called Immunoglobulin E (IgE). These antibodies travel to cells that release chemicals, causing an allergic reaction. Each type of IgE has a specific "radar" for  each type of allergen.  Unlike an intolerance to food, a food allergy can cause a serious or even life-threatening reaction by eating a microscopic amount, touching or inhaling the food.  Symptoms of allergic reactions to foods are generally seen on the skin (hives, itchiness, swelling of the skin). Gastrointestinal  symptoms may include vomiting and diarrhea. Respiratory symptoms may accompany skin and gastrointestinal symptoms, but don't usually occur alone.  Anaphylaxis (pronounced an-a-fi-LAK-sis) is a serious allergic reaction that happens very quickly. Symptoms of anaphylaxis may include difficulty breathing, dizziness or loss of consciousness. Without immediate treatment--an injection of epinephrine (adrenalin) and expert care--anaphylaxis can be fatal.  To the Point: there is a very serious difference between being intolerant to a food and having a food allergy.     Regarding Food IgG Testing: In IgG testing, the blood is tested for IgG antibodies instead of being tested for IgE antibodies (i.e., the antibodies typically associated with food allergies). The existence of serum IgG antibodies towards particular foods is claimed by many practitioners as a tool to diagnose food allergy or intolerance. The problem with this is that IgG is a "memory antibody." IgG signifies exposure to a food, not allergy to a food. Since a normal immune system should make IgG antibodies to foreign proteins, a positive IgG test to a food is a sign of a normal immune system. In fact, a positive result can actually indicate tolerance for the food, not intolerance. There is no scientific evidence to support IgG testing for the diagnosis of food allergies.      Food Adult Perc - 09/30/23 1000     Time Antigen Placed 1028    Allergen Manufacturer Amber Rose    Location Back    Number of allergen test 72     Control-buffer 50% Glycerol Negative    Control-Histamine 2+    1. Peanut Negative    2. Soybean Negative    3. Wheat Negative    4. Sesame Negative    5. Milk, Cow Negative    6. Casein Negative    7. Egg White, Chicken Negative    8. Shellfish Mix Negative    9. Fish Mix Negative    10. Cashew Negative    11. Walnut Food Negative    12. Almond Negative    13. Hazelnut Negative    14. Pecan Food Negative    15.  Pistachio Negative    16. [place] Nut Negative    17. Coconut Negative    18. Trout Negative    19. Tuna Negative    20. Salmon Negative    21. Flounder Negative    22. Codfish Negative    23. Shrimp Negative    24. Crab Negative    25. Lobster Negative    26. Oyster Negative    27. Scallops Negative    28. Oat  Negative    29. Rice Negative    30. Barley Negative    31. Rye  Negative    32. Hops Negative    33. [place] Meat Negative    34. Chicken Meat Negative    35. Pork Negative    36. Beef Negative    37. Lamb Negative    38. Tomato Negative    39. White Potato Negative    40. Sweet Potato Negative    41. Pea, Green/English Negative    42. Navy Bean Negative    43. Green Beans Negative    44. Squash Negative  45. Green Pepper Negative    46. Mushrooms Negative    47. Onion Negative    48. Avocado Negative    49. Cabbage Negative    50. Carrots Negative    51. Celery Negative    52. Corn Negative    53. Cucumber Negative    54. Grape (White seedless) Negative    55. Orange  Negative    56. Lemon Negative    57. Banana Negative    58. Apple Negative    59. Peach Negative    60. Strawberry Negative    61. Blueberry Negative    62. Cherry Negative    63. Cantaloupe Negative    64. Watermelon Negative    65. Pineapple Negative    66. Chocolate/Cacao Bean Negative    67. Cinnamon Negative    68. Nutmeg Negative    69. Ginger Negative    70. Garlic Negative    71. Pepper, Black Negative    72. Mustard Negative

## 2023-10-01 ENCOUNTER — Other Ambulatory Visit: Payer: Self-pay | Admitting: Family Medicine

## 2023-10-06 ENCOUNTER — Encounter: Payer: Self-pay | Admitting: Gastroenterology

## 2023-10-06 NOTE — Telephone Encounter (Signed)
L/m for Amber Rose regarding IVIG status through the cancer center

## 2023-10-07 NOTE — Telephone Encounter (Signed)
Pharmacy Patient Advocate Encounter  Received notification from [facility] that Prior Authorization for {XIFAXAN 550MG   has been APPROVED from 09/24/23 to 10/08/23. Pt already received her prescription on 09/27/23   PA #/Case ID/Reference #: [id number]

## 2023-10-12 NOTE — Telephone Encounter (Signed)
Called patient to advise still no response from Cancer [facility]tr regarding IVIG. I was discuss other options in home or at another facility but patient advised she wants to hold off on therapy for a while and do more research

## 2023-10-13 ENCOUNTER — Encounter: Payer: Self-pay | Admitting: Obstetrics and Gynecology

## 2023-10-13 ENCOUNTER — Ambulatory Visit (INDEPENDENT_AMBULATORY_CARE_PROVIDER_SITE_OTHER): Payer: 59 | Admitting: Obstetrics and Gynecology

## 2023-10-13 VITALS — BP 114/72 | HR 80 | Ht 64.25 in | Wt 165.0 lb

## 2023-10-13 DIAGNOSIS — R8290 Unspecified abnormal findings in urine: Secondary | ICD-10-CM | POA: Diagnosis not present

## 2023-10-13 DIAGNOSIS — N898 Other specified noninflammatory disorders of vagina: Secondary | ICD-10-CM

## 2023-10-13 DIAGNOSIS — N951 Menopausal and female climacteric states: Secondary | ICD-10-CM

## 2023-10-13 DIAGNOSIS — D509 Iron deficiency anemia, unspecified: Secondary | ICD-10-CM | POA: Diagnosis not present

## 2023-10-13 DIAGNOSIS — E0800 Diabetes mellitus due to underlying condition with hyperosmolarity without nonketotic hyperglycemic-hyperosmolar coma (NKHHC): Secondary | ICD-10-CM

## 2023-10-13 DIAGNOSIS — E119 Type 2 diabetes mellitus without complications: Secondary | ICD-10-CM | POA: Diagnosis not present

## 2023-10-13 DIAGNOSIS — E785 Hyperlipidemia, unspecified: Secondary | ICD-10-CM | POA: Diagnosis not present

## 2023-10-13 DIAGNOSIS — I10 Essential (primary) hypertension: Secondary | ICD-10-CM | POA: Diagnosis not present

## 2023-10-13 DIAGNOSIS — N921 Excessive and frequent menstruation with irregular cycle: Secondary | ICD-10-CM | POA: Diagnosis not present

## 2023-10-13 LAB — WET PREP FOR TRICH, YEAST, CLUE

## 2023-10-13 MED ORDER — INTRAROSA 6.5 MG VA INST: 1.0000 | VAGINAL_INSERT | Freq: Every evening | VAGINAL | 12 refills | Status: DC | PRN

## 2023-10-13 NOTE — Telephone Encounter (Signed)
Noted thank you

## 2023-10-13 NOTE — Progress Notes (Signed)
Acute Office Visit  Subjective:    Patient ID: Amber Rose, female    DOB: [birth year], 47 y.o.   MRN: [id number]   HPI 47 y.o. presents today for NGYN (NGYN, aub//jj/Pt c/o yellow discharge & urine odor & frequency) Patient with DUB, adenomyosis, menorrhagia, anemia, fibroids, failed hormonal management. Patient states she is taking 4 pills a day of the micronor to reduce her bleeding now and avoid more iv iron. She was having 8-10 days of heavy bleeding with large clots and had severe cramping at first.  She was started on micronor and then bled all the time and was changed to prometrium 200mg  at bedtime.  She bled on this as well and had severe cramps. She went back to micronor and then increased to 4 tables a day to slow her bleeding. Her provider recommended a hysterectomy but her sugars increased to 400's.  Another provider thought the progesterone was causing the high sugars, but the patient does recall that she stopped the ozempic during this time.  She has gone back on ozempic and insulin and metfromin and states that her sugars are now in the 130-140's.  She does not want an IUD or nexplanon.  She tried depo in the past with no success. She is seeing endo on 1/27 She has a history of LTCS with tubal ligation. Also reports hot flashes and decreased libido  TV US 4/24 Impression  CLINICAL DATA:  menorrhagia   EXAM: ULTRASOUND OF PELVIS   TECHNIQUE: Transabdominal and transvaginalultrasound examination of the pelvis was performed including evaluation of the uterus, ovaries, adnexal regions, and pelvic cul-de-sac.   COMPARISON:  None Available.   FINDINGS: Uterusanteverted, 8 x 7 x 5 cm. Myometrium is inhomogeneous which can be seen with adenomyosis. Endometrium is about 9 mm thickness. Possible mass effect on the fundal portion of the endometrium which could be due to a fibroid. The possibility of adenomyosis and fibroid could be better evaluated with MRI.    Right ovary   Unremarkable, 2.4 x 1.9 x 1.9 cm.   Left ovary   Large complex cystic lesion in the left adnexa without definite ovarian parenchyma. This measures 11 x 8 x 5 cm. MRI might be helpful in further characterization of this finding.   Images of the adnexae demonstrated no additional masses or fluid collections.   IMPRESSION: 1. Heterogeneous appearance of the myometrium with some poorly defined possible mass effect on the fundal portion of the endometrium. These findings could be due to adenomyosis and/or a fibroid. Consider MRI for further evaluation. 2. Large heterogeneous septated cystic mass in the left adnexa. Presumably this arises from the left ovary but no ovarian parenchyma is appreciated. This too could be assessed further with MRI.     Electronically Signed   By: Layla Maw M.D.   On: 02/08/2023 17:01      MRI 03/12/23 MR PELVIS W WO CONTRAST (Accession [id number]) (Order [id number]) Imaging Date: 03/12/2023 Department: [facility] AT [address] Released By: Broadus John Authorizing: Jerene Bears, MD   Exam Status  Status  Final [99]   PACS Intelerad Image Link   Show images for MR PELVIS W WO CONTRAST Study Result  Narrative & Impression  CLINICAL DATA:  Left ovarian mass identified by ultrasound, patient postmenopausal   EXAM: MRI PELVIS WITHOUT AND WITH CONTRAST   TECHNIQUE: Multiplanar multisequence MR imaging of the pelvis was performed both before and after administration of intravenous contrast.   CONTRAST:  7  mL Vueway gadolinium contrast IV   COMPARISON:  Pelvic ultrasound, 02/06/2023, CT abdomen pelvis, 03/02/2022   FINDINGS: Urinary Tract:  No abnormality visualized.   Bowel:  Unremarkable visualized pelvic bowel loops.   Vascular/Lymphatic: No pathologically enlarged lymph nodes. No significant vascular abnormality seen.   Reproductive: Uterine adenomyosis. Intramural fibroids, largest  at the left aspect of the uterine fundus measuring 3.2 cm (series 12, image 26). Multiple small follicles of the bilateral ovaries. No mass or suspicious cystic lesion.   Other:  Small volume free fluid in the low pelvis.   Musculoskeletal: No suspicious bone lesions identified.   IMPRESSION: 1. Multiple small follicles of the bilateral ovaries. No mass or suspicious cystic lesion. Cystic lesion identified in the left ovary on prior ultrasound presumably reflected a since resolved hemorrhagic ovarian cyst. 2. Uterine adenomyosis. 3. Intramural fibroids, largest at the left aspect of the uterine fundus measuring 3.2 cm. 4. Small volume free fluid in the low pelvis.     Electronically Signed   By: Jearld Lesch M.D.   On: 03/12/2023 10:00      Result History  MR PELVIS W WO CONTRAST (Order #[id number]) on 03/12/2023 - Order Result History Report  No LMP recorded. (Menstrual status: Other).   1 Result Note    Component Ref Range & Units (hover) 2 mo ago  SURGICAL PATHOLOGY SURGICAL PATHOLOGY CASE: [id number] PATIENT: Amber Rose Surgical Pathology Report     Clinical History: menorrhagia with regular cycle, H/O adenomyosis on MRI (cm)     FINAL MICROSCOPIC DIAGNOSIS:  A. ENDOMETRIUM, BIOPSY: Benign endometrium with marked progestational effect Negative for polyp, breakdown, atypia, hyperplasia and carcinoma   GROSS DESCRIPTION:  Received in formalin is blood tinged mucus that is entirely submitted in one block.  Volume: 1.1 x 1 x 0.2 cm.  SW 07/30/2023  Final Diagnosis performed by Jerene Bears, MD.   Electronically signed 08/02/2023 Technical and / or Professional components performed at [facility], [address], [place], [place] [address].  Immunohistochemistry Technical component (if applicable) was performed at [facility]. [address], [address], [place], [place] [address].    IMMUNOHISTOCHEMISTRY DISCLAIMER (if applicable): Some of these immunohistochemical stains may have been developed and the performance characteristics determine by [facility]. Some may not have been cleared or approved by the U.S. Food and Drug Administration. The FDA has determined that such clearance or approval is not necessary. This test is used for clinical purposes. It should not be regarded as investigational or for research. This laboratory is certified under the Clinical Laboratory Improvement Amendments of 1988 (CLIA-88) as qualified to perform high complexity clinical laboratory testing.  The controls stained appropriately.   IHC stains are performed on formalin fixed, paraffin embedded tissue [place]     Review of Systems     Objective:    OBGyn Exam  BP 114/72   Pulse 80   Ht 5' 4.25" (1.632 m)   Wt 165 lb (74.8 kg)   SpO2 99%   BMI 28.10 kg/m  Wt Readings from Last 3 Encounters:  10/13/23 165 lb (74.8 kg)  09/20/23 164 lb 9.6 oz (74.7 kg)  09/16/23 165 lb 6.4 oz (75 kg)   Abdomen: can feel bulge with position. NT. To get repeat TV US     Patient informed chaperone available to be present for breast and/or pelvic exam. Patient has requested no chaperone to be present. Patient has been advised what will be completed during breast  and pelvic exam.   Assessment & Plan:   Discussed not increasing any further on the  micronor.  All options reviewed and r/b/a/I discussed.  I agree that patient will need RLH, as she has failed hormonal management and is continuing to bleed.  We discussed the procedure in detail.  Discussed importance of keeping her ovaries at the time of surgery.  Counseled on smoking cessation and good fsbs control.  Discussed high risk for infection and cuff separation with poor sugar control. She will consider this.  Discussed progesterone is not causing her high sugars, but she needs a long term solution for the future and cannot keep bleeding  like this. 2. Nuswab with no infection 3. Labs: see orders  30 minutes spent on reviewing records, imaging,  and one on one patient time and counseling patient and documentation Dr. Judith Blonder

## 2023-10-15 LAB — URINE CULTURE
MICRO NUMBER:: 15866092
Result:: NO GROWTH
SPECIMEN QUALITY:: ADEQUATE

## 2023-10-15 LAB — URINALYSIS, COMPLETE W/RFL CULTURE
Bilirubin Urine: NEGATIVE
Glucose, UA: NEGATIVE
Hyaline Cast: NONE SEEN /[LPF]
Ketones, ur: NEGATIVE
Leukocyte Esterase: NEGATIVE
Nitrites, Initial: NEGATIVE
Specific Gravity, Urine: 1.02 (ref 1.001–1.035)
pH: 5.5 (ref 5.0–8.0)

## 2023-10-15 LAB — CULTURE INDICATED

## 2023-10-19 LAB — ESTRADIOL: Estradiol: <15 pg/mL

## 2023-10-19 LAB — TESTOS,TOTAL,FREE AND SHBG (FEMALE)
Free Testosterone: 1.3 pg/mL (ref 0.1–6.4)
Sex Hormone Binding: 25.3 nmol/L (ref 17–124)
Testosterone, Total, LC-MS-MS: 6 ng/dL (ref 2–45)

## 2023-10-19 LAB — HEMOGLOBIN A1C
Hgb A1c MFr Bld: 9.2 %{Hb} — ABNORMAL HIGH (ref <5.7)
Mean Plasma Glucose: 217 mg/dL
eAG (mmol/L): 12 mmol/L

## 2023-10-19 LAB — FOLLICLE STIMULATING HORMONE: FSH: 2.6 m[IU]/mL

## 2023-10-19 LAB — TSH: TSH: 0.75 m[IU]/L

## 2023-10-21 ENCOUNTER — Other Ambulatory Visit: Payer: Self-pay

## 2023-10-21 DIAGNOSIS — E0800 Diabetes mellitus due to underlying condition with hyperosmolarity without nonketotic hyperglycemic-hyperosmolar coma (NKHHC): Secondary | ICD-10-CM

## 2023-10-21 DIAGNOSIS — N951 Menopausal and female climacteric states: Secondary | ICD-10-CM

## 2023-10-21 DIAGNOSIS — N921 Excessive and frequent menstruation with irregular cycle: Secondary | ICD-10-CM

## 2023-10-22 ENCOUNTER — Other Ambulatory Visit: Payer: 59

## 2023-10-22 DIAGNOSIS — E0800 Diabetes mellitus due to underlying condition with hyperosmolarity without nonketotic hyperglycemic-hyperosmolar coma (NKHHC): Secondary | ICD-10-CM | POA: Diagnosis not present

## 2023-10-22 DIAGNOSIS — N951 Menopausal and female climacteric states: Secondary | ICD-10-CM

## 2023-10-22 DIAGNOSIS — E119 Type 2 diabetes mellitus without complications: Secondary | ICD-10-CM

## 2023-10-22 DIAGNOSIS — N921 Excessive and frequent menstruation with irregular cycle: Secondary | ICD-10-CM

## 2023-10-22 HISTORY — DX: Type 2 diabetes mellitus without complications: E11.9

## 2023-10-23 ENCOUNTER — Encounter (HOSPITAL_BASED_OUTPATIENT_CLINIC_OR_DEPARTMENT_OTHER): Payer: Self-pay | Admitting: Pharmacist Clinician (PhC)/ Clinical Pharmacy Specialist

## 2023-10-23 LAB — HEMOGLOBIN A1C
Hgb A1c MFr Bld: 8.8 %{Hb} — ABNORMAL HIGH (ref <5.7)
Mean Plasma Glucose: 206 mg/dL
eAG (mmol/L): 11.4 mmol/L

## 2023-10-23 LAB — FOLLICLE STIMULATING HORMONE: FSH: 2.4 m[IU]/mL

## 2023-10-25 ENCOUNTER — Encounter: Payer: Self-pay | Admitting: Obstetrics and Gynecology

## 2023-10-29 ENCOUNTER — Encounter: Payer: Self-pay | Admitting: Allergy & Immunology

## 2023-11-03 ENCOUNTER — Other Ambulatory Visit: Payer: Self-pay | Admitting: Pharmacist

## 2023-11-03 NOTE — Progress Notes (Signed)
 Pharmacy Quality Measure Review  This patient is appearing on report for being at risk of failing the measure for Statin Use in Persons with Diabetes (SUPD) medications this calendar year.   We successfully documented a G72.0 last year. Will request PCP to use the same code again this year.   Herlene Fleeta Morris, PharmD, JAQUELINE, CPP Clinical Pharmacist [facility] [phone]

## 2023-11-10 ENCOUNTER — Encounter: Payer: Self-pay | Admitting: Family Medicine

## 2023-11-11 ENCOUNTER — Telehealth: Payer: 59 | Admitting: Family Medicine

## 2023-11-11 ENCOUNTER — Encounter: Payer: Self-pay | Admitting: Family Medicine

## 2023-11-11 DIAGNOSIS — Z794 Long term (current) use of insulin: Secondary | ICD-10-CM

## 2023-11-11 DIAGNOSIS — E1149 Type 2 diabetes mellitus with other diabetic neurological complication: Secondary | ICD-10-CM | POA: Diagnosis not present

## 2023-11-11 DIAGNOSIS — E11649 Type 2 diabetes mellitus with hypoglycemia without coma: Secondary | ICD-10-CM | POA: Diagnosis not present

## 2023-11-11 MED ORDER — FREESTYLE LIBRE 2 SENSOR MISC: 3 refills | Status: DC

## 2023-11-11 NOTE — Progress Notes (Signed)
Virtual Visit via Video Note  I connected with Amber Rose, on 11/11/2023 at 9:07 AM by video enabled telemedicine device and verified that I am speaking with the correct person using two identifiers.   Consent: I discussed the limitations, risks, security and privacy concerns of performing an evaluation and management service by telemedicine and the availability of in person appointments. I also discussed with the patient that there may be a patient responsible charge related to this service. The patient expressed understanding and agreed to proceed.   Location of Patient: Out of town  Location of Provider: Clinic   Persons participating in Telemedicine visit: Amber Rose Dr. Alvis Lemmings     History of Present Illness: Amber Rose is a 48 y.o. year old female  with a history of type 2 diabetes mellitus , hypertension, depression and anxiety, hydradenitis, tobacco abuse, fibromyalgia seen for an acute visit.   Discussed the use of AI scribe software for clinical note transcription with the patient, who gave verbal consent to proceed.   The patient, with a history of diabetes, reports fluctuating blood sugars despite adherence to insulin and metformin therapy. She describes a pattern of rapid blood sugar spikes and drops, with levels ranging from 70 to 200. These fluctuations are associated with physical symptoms such as sweating, shaking, and extreme hunger, which occur approximately every 45 minutes throughout the day. The patient has been managing these symptoms by eating frequently and monitoring her blood sugar closely with a sensor. She has also made significant dietary changes, eliminating pasta, rice, and bread from her diet. Despite these efforts, the patient's blood sugar levels remain unstable. This instability is causing the patient significant distress, particularly as she is currently out of town and her sensor has recently become dislodged.  She was  referred to endocrine on her appointment comes up at the end of the month.     Past Medical History:  Diagnosis Date   Abnormal Pap smear of cervix    yrs ago   Adenomyosis    Allergy    Anemia    Anxiety    Aortic atherosclerosis (HCC)    Arthritis    Asthma    Chronic UTI    Clostridium difficile infection    Depression    Diabetes mellitus without complication (HCC) 10/22/2023   A1C 8.8   Diverticulosis    Endometriosis    Fibroid    Fibromyalgia    GERD (gastroesophageal reflux disease)    Hiatal hernia    Hyperlipidemia    Hypertension    Internal hemorrhoids    Meningitis, viral    Migraines    Pancreatitis    Pure hypercholesterolemia 12/23/2020   Tubular adenoma of colon    Allergies  Allergen Reactions   Amlodipine     Leg pain, swelling around eyes    Atorvastatin Other (See Comments)     Joint pain   Crestor [Rosuvastatin]     Joint pain    Irbesartan     Leg pain, swelling around eyes   Metformin And Related Nausea And Vomiting   Metronidazole Nausea And Vomiting   Penicillins     childhood   Xanax [Alprazolam]     "Caused upper respiratory symptoms" per pt   Glyburide Other (See Comments)    "blood sugar dropped uncontrollably"   Linagliptin Diarrhea and Other (See Comments)    Stomach pain, sinus infection   Moxifloxacin Diarrhea    Current Outpatient Medications on File Prior to Visit  Medication Sig Dispense Refill   acyclovir (ZOVIRAX) 400 MG tablet TAKE 1 TABLET BY MOUTH TWICE  DAILY 180 tablet 1   albuterol (PROAIR HFA) 108 (90 Base) MCG/ACT inhaler Inhale 2 puffs into the lungs every 4 (four) hours as needed for wheezing or shortness of breath. 3 Inhaler 0   Blood Glucose Monitoring Suppl (ACCU-CHEK AVIVA PLUS) w/Device KIT USE AS DIRECTED DAILY. E11.9 1 kit 0   buPROPion (WELLBUTRIN XL) 150 MG 24 hr tablet Take 1 tablet (150 mg total) by mouth daily. (Patient not taking: Reported on 10/13/2023) 90 tablet 1   Continuous Blood Gluc  Receiver (FREESTYLE LIBRE 2 READER) DEVI Use to check blood sugar three times daily. E11.49 1 each 0   Continuous Blood Gluc Sensor (FREESTYLE LIBRE 2 SENSOR) MISC Use to check blood sugar three times daily. Change sensors once every 14 days. E11.49 2 each 3   glucose blood (FREESTYLE TEST STRIPS) test strip Use to check blood sugar three times daily. E11.49 100 each 3   hydrOXYzine (ATARAX) 50 MG tablet Take 1 tablet (50 mg total) by mouth 3 (three) times daily as needed. 30 tablet 5   ibuprofen (ADVIL) 800 MG tablet TAKE 1 TABLET BY MOUTH EVERY 12  HOURS AS NEEDED 90 tablet 1   insulin glargine (LANTUS SOLOSTAR) 100 UNIT/ML Solostar Pen Inject 25 Units into the skin daily. Increase by 2 units to a maximum daily dose of 30 units every 4th day until blood sugars are at goal 15 mL 6   Insulin Pen Needle 31G X 5 MM MISC 1 each by Does not apply route at bedtime. 30 each 5   Lancet Devices (ACCU-CHEK SOFTCLIX) lancets Use as instructed daily. 1 each 5   Lancets (FREESTYLE) lancets Use to check blood sugar three times daily. E11.49 100 each 3   metFORMIN (GLUCOPHAGE) 500 MG tablet Take 1 tablet (500 mg total) by mouth 2 (two) times daily with a meal. 180 tablet 1   methocarbamol (ROBAXIN) 750 MG tablet Take 1 tablet (750 mg total) by mouth every 8 (eight) hours as needed for muscle spasms. (Patient not taking: Reported on 10/13/2023) 90 tablet 3   norethindrone (AYGESTIN) 5 MG tablet Take 5 mg by mouth 3 (three) times daily.     omeprazole (PRILOSEC) 40 MG capsule TAKE 1 CAPSULE BY MOUTH EVERY  MORNING 100 capsule 0   OZEMPIC, 2 MG/DOSE, 8 MG/3ML SOPN INJECT SUBCUTANEOUSLY 2 MG  WEEKLY AS DIRECTED 3 mL 11   Prasterone (INTRAROSA) 6.5 MG INST Place 1 suppository vaginally at bedtime as needed. 30 each 12   pregabalin (LYRICA) 100 MG capsule TAKE 1 CAPSULE BY MOUTH TWICE  DAILY 60 capsule 5   saccharomyces boulardii (FLORASTOR) 250 MG capsule Take 1 capsule (250 mg total) by mouth 2 (two) times daily. 60  capsule 0   spironolactone (ALDACTONE) 50 MG tablet Take 1 tablet (50 mg total) by mouth daily. 90 tablet 3   No current facility-administered medications on file prior to visit.    ROS: See HPI  Observations/Objective: Awake, alert, oriented x3 Not in acute distress Normal mood      Latest Ref Rng & Units 09/16/2023   10:35 AM 08/30/2023   10:52 AM 08/13/2023    9:21 AM  CMP  Glucose 70 - 99 mg/dL 875  643  329   BUN 6 - 24 mg/dL 19  21  21    Creatinine 0.57 - 1.00 mg/dL 5.18  8.41  6.60   Sodium  134 - 144 mmol/L 139  134  135   Potassium 3.5 - 5.2 mmol/L 4.5  4.8  4.9   Chloride 96 - 106 mmol/L 103  101  101   CO2 20 - 29 mmol/L 21  28  27    Calcium 8.7 - 10.2 mg/dL 9.7  9.8  9.5   Total Protein 6.5 - 8.1 g/dL  7.5    Total Bilirubin <1.2 mg/dL  0.6    Alkaline Phos 38 - 126 U/L  59    AST 15 - 41 U/L  13    ALT 0 - 44 U/L  14      Lipid Panel     Component Value Date/Time   CHOL 213 (H) 12/01/2022 0955   TRIG 105 12/01/2022 0955   HDL 43 12/01/2022 0955   CHOLHDL 5.0 (H) 12/01/2022 0955   CHOLHDL 4.4 10/28/2016 0851   LDLCALC 151 (H) 12/01/2022 0955   LDLDIRECT 51 08/14/2022 1431   LABVLDL 19 12/01/2022 0955    Lab Results  Component Value Date   HGBA1C 8.8 (H) 10/22/2023     Assessment and Plan: Assessment and Plan    Type II Diabetes Mellitus Fluctuating blood sugars with episodes of hypoglycemia and hyperglycemia. Patient reports symptoms of hypoglycemia (sweating, shaking, extreme hunger) when blood sugar drops to around 70. Hyperglycemia up to 200. Patient is currently on Lantus 28 units daily and Metformin. -Reduce Lantus to 25 units daily to prevent hypoglycemia. -Check blood sugars regularly and report any episodes of hypoglycemia or hyperglycemia. -Endocrinology appointment scheduled for 11/21/2023.  Continuous Glucose Monitor Sensor dislodged while patient is out of town. -Send prescription for new sensor to local pharmacy (patient to  provide details via MyChat). -Ensure patient has access to new sensor to continue monitoring blood sugars while out of town.        Follow Up Instructions: Keep previously scheduled appointment   I discussed the assessment and treatment plan with the patient. The patient was provided an opportunity to ask questions and all were answered. The patient agreed with the plan and demonstrated an understanding of the instructions.   The patient was advised to call back or seek an in-person evaluation if the symptoms worsen or if the condition fails to improve as anticipated.     I provided 11 minutes total of Telehealth time during this encounter including median intraservice time, reviewing previous notes, investigations, ordering medications, medical decision making, coordinating care and patient verbalized understanding at the end of the visit.     Hoy Register, MD, FAAFP. [facility] [place], [place] [phone]   11/11/2023, 9:07 AM

## 2023-11-11 NOTE — Patient Instructions (Signed)
VISIT SUMMARY:  During your visit, we discussed your fluctuating blood sugar levels despite your adherence to insulin and metformin therapy. You reported experiencing rapid spikes and drops in your blood sugar, accompanied by physical symptoms such as sweating, shaking, and extreme hunger. We also addressed the issue of your continuous glucose monitor sensor becoming dislodged while you are out of town.  YOUR PLAN:  -DIABETES MELLITUS: Diabetes Mellitus is a condition where your body has difficulty regulating blood sugar levels. To help manage your fluctuating blood sugars and prevent episodes of low blood sugar (hypoglycemia), we have reduced your Lantus dose to 25 units daily. Please continue to check your blood sugars regularly and report any episodes of low or high blood sugar. You have an appointment with an endocrinologist on November 21, 2023.  -CONTINUOUS GLUCOSE MONITOR: Your continuous glucose monitor sensor became dislodged while you are out of town. We will send a prescription for a new sensor to your local pharmacy. Please provide the pharmacy details via MyChat to ensure you have access to a new sensor and can continue monitoring your blood sugars.  INSTRUCTIONS:  Please follow up with your endocrinology appointment on November 21, 2023. Continue to monitor your blood sugars regularly and report any significant changes or episodes of hypoglycemia or hyperglycemia. Provide your local pharmacy details via MyChat to receive a new continuous glucose monitor sensor.

## 2023-11-12 ENCOUNTER — Encounter: Payer: Self-pay | Admitting: Adult Health

## 2023-11-15 ENCOUNTER — Telehealth: Payer: Self-pay

## 2023-11-15 NOTE — Telephone Encounter (Signed)
Patient contacted to schedule appointment for Follow-up A1C,.  Appointment for 0/28/2025.  patient to bring BS monitor and or log to appointment.

## 2023-11-18 ENCOUNTER — Other Ambulatory Visit: Payer: Self-pay | Admitting: Obstetrics and Gynecology

## 2023-11-18 ENCOUNTER — Encounter: Payer: Self-pay | Admitting: Obstetrics and Gynecology

## 2023-11-18 ENCOUNTER — Ambulatory Visit (INDEPENDENT_AMBULATORY_CARE_PROVIDER_SITE_OTHER): Payer: 59

## 2023-11-18 ENCOUNTER — Ambulatory Visit: Payer: 59 | Admitting: Obstetrics and Gynecology

## 2023-11-18 ENCOUNTER — Encounter: Payer: Self-pay | Admitting: Family Medicine

## 2023-11-18 ENCOUNTER — Encounter: Payer: Self-pay | Admitting: Adult Health

## 2023-11-18 VITALS — BP 124/84

## 2023-11-18 DIAGNOSIS — E559 Vitamin D deficiency, unspecified: Secondary | ICD-10-CM | POA: Diagnosis not present

## 2023-11-18 DIAGNOSIS — D500 Iron deficiency anemia secondary to blood loss (chronic): Secondary | ICD-10-CM

## 2023-11-18 DIAGNOSIS — Z712 Person consulting for explanation of examination or test findings: Secondary | ICD-10-CM | POA: Diagnosis not present

## 2023-11-18 DIAGNOSIS — R5382 Chronic fatigue, unspecified: Secondary | ICD-10-CM

## 2023-11-18 DIAGNOSIS — N921 Excessive and frequent menstruation with irregular cycle: Secondary | ICD-10-CM

## 2023-11-18 DIAGNOSIS — E0800 Diabetes mellitus due to underlying condition with hyperosmolarity without nonketotic hyperglycemic-hyperosmolar coma (NKHHC): Secondary | ICD-10-CM

## 2023-11-18 DIAGNOSIS — N951 Menopausal and female climacteric states: Secondary | ICD-10-CM

## 2023-11-18 DIAGNOSIS — R8290 Unspecified abnormal findings in urine: Secondary | ICD-10-CM

## 2023-11-18 DIAGNOSIS — N898 Other specified noninflammatory disorders of vagina: Secondary | ICD-10-CM

## 2023-11-18 DIAGNOSIS — D219 Benign neoplasm of connective and other soft tissue, unspecified: Secondary | ICD-10-CM | POA: Diagnosis not present

## 2023-11-18 DIAGNOSIS — N8003 Adenomyosis of the uterus: Secondary | ICD-10-CM

## 2023-11-18 DIAGNOSIS — E785 Hyperlipidemia, unspecified: Secondary | ICD-10-CM | POA: Diagnosis not present

## 2023-11-18 DIAGNOSIS — E119 Type 2 diabetes mellitus without complications: Secondary | ICD-10-CM | POA: Diagnosis not present

## 2023-11-18 DIAGNOSIS — I10 Essential (primary) hypertension: Secondary | ICD-10-CM | POA: Diagnosis not present

## 2023-11-18 MED ORDER — PHEXXI 1.8-1-0.4 % VA GEL: 1.0000 | VAGINAL | 12 refills | Status: DC

## 2023-11-18 NOTE — Progress Notes (Signed)
Acute Office Visit  Subjective:    Patient ID: Amber Rose, female    DOB: [birth year], 48 y.o.   MRN: [id number]   HPI 48 y.o. presents today for ultrasound & consult (Ultrasound & consult//jj) Patient with DUB, adenomyosis, menorrhagia, anemia, fibroids, failed hormonal management. Patient states she is taking 4 pills a day of the micronor to reduce her bleeding now and help avoid more iv iron.  She is still having some light bleeding. And reports the iv iron helped her feel better when she had it. She was having 8-10 days of heavy bleeding with large clots and had severe cramping at first.  She was started on micronor and then bled all the time and was changed to prometrium 200mg  at bedtime.  She bled on this as well and had severe cramps. She went back to micronor and then increased to 4 tables a day to slow her bleeding. Her provider recommended a hysterectomy but her sugars increased to 400's.  Another provider thought the progesterone was causing the high sugars, but the patient does recall that she stopped the ozempic during this time.  She has gone back on ozempic and insulin and metfromin and states that her sugars are now in the 130-140's.  She does not want an IUD or nexplanon.  She tried depo in the past with no success. She is seeing endo on 1/27 She has a history of LTCS with tubal ligation. Also reports hot flashes and decreased libido Sugars has been spiking up and down, despite strict diet.  Seeing endocrinology on the 27th of this month. She would like to move forward with scheduling for the [facility] and is frustrated with the bleeding.  She is hoping   TV US 4/24 Impression  CLINICAL DATA:  menorrhagia   EXAM: ULTRASOUND OF PELVIS   TECHNIQUE: Transabdominal and transvaginalultrasound examination of the pelvis was performed including evaluation of the uterus, ovaries, adnexal regions, and pelvic cul-de-sac.   COMPARISON:  None Available.    FINDINGS: Uterusanteverted, 8 x 7 x 5 cm. Myometrium is inhomogeneous which can be seen with adenomyosis. Endometrium is about 9 mm thickness. Possible mass effect on the fundal portion of the endometrium which could be due to a fibroid. The possibility of adenomyosis and fibroid could be better evaluated with MRI.   Right ovary   Unremarkable, 2.4 x 1.9 x 1.9 cm.   Left ovary   Large complex cystic lesion in the left adnexa without definite ovarian parenchyma. This measures 11 x 8 x 5 cm. MRI might be helpful in further characterization of this finding.   Images of the adnexae demonstrated no additional masses or fluid collections.   IMPRESSION: 1. Heterogeneous appearance of the myometrium with some poorly defined possible mass effect on the fundal portion of the endometrium. These findings could be due to adenomyosis and/or a fibroid. Consider MRI for further evaluation. 2. Large heterogeneous septated cystic mass in the left adnexa. Presumably this arises from the left ovary but no ovarian parenchyma is appreciated. This too could be assessed further with MRI.     Electronically Signed   By: Layla Maw M.D.   On: 02/08/2023 17:01      MRI 03/12/23 MR PELVIS W WO CONTRAST (Accession [id number]) (Order [id number]) Imaging Date: 03/12/2023 Department: [facility] AT [address] Released By: Broadus John Authorizing: Jerene Bears, MD   Exam Status  Status  Final [99]   PACS Intelerad Image Link   Show images  for MR PELVIS W WO CONTRAST Study Result  Narrative & Impression  CLINICAL DATA:  Left ovarian mass identified by ultrasound, patient postmenopausal   EXAM: MRI PELVIS WITHOUT AND WITH CONTRAST   TECHNIQUE: Multiplanar multisequence MR imaging of the pelvis was performed both before and after administration of intravenous contrast.   CONTRAST:  7 mL Vueway gadolinium contrast IV   COMPARISON:  Pelvic ultrasound,  02/06/2023, CT abdomen pelvis, 03/02/2022   FINDINGS: Urinary Tract:  No abnormality visualized.   Bowel:  Unremarkable visualized pelvic bowel loops.   Vascular/Lymphatic: No pathologically enlarged lymph nodes. No significant vascular abnormality seen.   Reproductive: Uterine adenomyosis. Intramural fibroids, largest at the left aspect of the uterine fundus measuring 3.2 cm (series 12, image 26). Multiple small follicles of the bilateral ovaries. No mass or suspicious cystic lesion.   Other:  Small volume free fluid in the low pelvis.   Musculoskeletal: No suspicious bone lesions identified.   IMPRESSION: 1. Multiple small follicles of the bilateral ovaries. No mass or suspicious cystic lesion. Cystic lesion identified in the left ovary on prior ultrasound presumably reflected a since resolved hemorrhagic ovarian cyst. 2. Uterine adenomyosis. 3. Intramural fibroids, largest at the left aspect of the uterine fundus measuring 3.2 cm. 4. Small volume free fluid in the low pelvis.     Electronically Signed   By: Jearld Lesch M.D.   On: 03/12/2023 10:00      Result History  MR PELVIS W WO CONTRAST (Order #[id number]) on 03/12/2023 - Order Result History Report  No LMP recorded. (Menstrual status: Other).   1 Result Note    Component Ref Range & Units (hover) 2 mo ago  SURGICAL PATHOLOGY SURGICAL PATHOLOGY CASE: [id number] PATIENT: Amber Rose Surgical Pathology Report     Clinical History: menorrhagia with regular cycle, H/O adenomyosis on MRI (cm)     FINAL MICROSCOPIC DIAGNOSIS:  A. ENDOMETRIUM, BIOPSY: Benign endometrium with marked progestational effect Negative for polyp, breakdown, atypia, hyperplasia and carcinoma   GROSS DESCRIPTION:  Received in formalin is blood tinged mucus that is entirely submitted in one block.  Volume: 1.1 x 1 x 0.2 cm.  SW 07/30/2023  Final Diagnosis performed by Jerene Bears, MD.    Electronically signed 08/02/2023 Technical and / or Professional components performed at [facility]. [facility], [address] [place], [place] [address].  Immunohistochemistry Technical component (if applicable) was performed at [facility]. [address], [address], [place], [place] [address].   IMMUNOHISTOCHEMISTRY DISCLAIMER (if applicable): Some of these immunohistochemical stains may have been developed and the performance characteristics determine by [facility]. Some may not have been cleared or approved by the U.S. Food and Drug Administration. The FDA has determined that such clearance or approval is not necessary. This test is used for clinical purposes. It should not be regarded as investigational or for research. This laboratory is certified under the Clinical Laboratory Improvement Amendments of 1988 (CLIA-88) as qualified to perform high complexity clinical laboratory testing.  The controls stained appropriately.   IHC stains are performed on formalin fixed, paraffin embedded tissue [place]     Review of Systems     Objective:    OBGyn Exam  BP 124/84  Wt Readings from Last 3 Encounters:  10/13/23 165 lb (74.8 kg)  09/20/23 164 lb 9.6 oz (74.7 kg)  09/16/23 165 lb 6.4 oz (75 kg)   PUS today 10.92cm uterus which is larger from last ultrasound Normal ovaries Fibroids seen measuring  1.14cm and 1.11cm EM 7.51mm with some vascularity seen ?polyp  A/p: Fibroids Adenomyosis Endometrial polyp Menorrhagia Anemia H/o LTCS with BTL Failed hormonal management desires definitive management with the [facility], bilateral salpingectomy, cystoscopy Assessment & Plan:  Patient goal for [facility] in 3-4 with time to work with endocrinology for improved fsbs control.  Continue progesterone until surgery.  The procedure with r/b/a/I were discussed in detail. Post care instructions discussed as well. [place] results were reviewed and discussed with  patient.  20 minutes spent on reviewing records, imaging,  and one on one patient time and counseling patient and documentation Dr. Judith Blonder

## 2023-11-19 ENCOUNTER — Other Ambulatory Visit: Payer: Self-pay

## 2023-11-19 ENCOUNTER — Encounter: Payer: Self-pay | Admitting: Adult Health

## 2023-11-19 DIAGNOSIS — E119 Type 2 diabetes mellitus without complications: Secondary | ICD-10-CM

## 2023-11-19 LAB — IRON,TIBC AND FERRITIN PANEL
%SAT: 12 % — ABNORMAL LOW (ref 16–45)
Ferritin: 215 ng/mL (ref 16–232)
Iron: 43 ug/dL (ref 40–190)
TIBC: 345 ug/dL (ref 250–450)

## 2023-11-19 LAB — CBC
HCT: 44 % (ref 35.0–45.0)
Hemoglobin: 14.7 g/dL (ref 11.7–15.5)
MCH: 32.2 pg (ref 27.0–33.0)
MCHC: 33.4 g/dL (ref 32.0–36.0)
MCV: 96.3 fL (ref 80.0–100.0)
MPV: 10.8 fL (ref 7.5–12.5)
Platelets: 288 10*3/uL (ref 140–400)
RBC: 4.57 10*6/uL (ref 3.80–5.10)
RDW: 12.7 % (ref 11.0–15.0)
WBC: 8.5 10*3/uL (ref 3.8–10.8)

## 2023-11-19 LAB — VITAMIN D 25 HYDROXY (VIT D DEFICIENCY, FRACTURES): Vit D, 25-Hydroxy: 28 ng/mL — ABNORMAL LOW (ref 30–100)

## 2023-11-22 ENCOUNTER — Other Ambulatory Visit: Payer: 59

## 2023-11-22 DIAGNOSIS — E119 Type 2 diabetes mellitus without complications: Secondary | ICD-10-CM | POA: Diagnosis not present

## 2023-11-22 DIAGNOSIS — E11649 Type 2 diabetes mellitus with hypoglycemia without coma: Secondary | ICD-10-CM | POA: Diagnosis not present

## 2023-11-23 ENCOUNTER — Other Ambulatory Visit: Payer: Self-pay | Admitting: Family Medicine

## 2023-11-23 ENCOUNTER — Ambulatory Visit: Payer: 59 | Admitting: Pharmacist

## 2023-11-23 LAB — HEMOGLOBIN A1C
Hgb A1c MFr Bld: 7.7 %{Hb} — ABNORMAL HIGH (ref <5.7)
Mean Plasma Glucose: 174 mg/dL
eAG (mmol/L): 9.7 mmol/L

## 2023-11-23 LAB — COMPREHENSIVE METABOLIC PANEL
AG Ratio: 1.3 (calc) (ref 1.0–2.5)
ALT: 22 U/L (ref 6–29)
AST: 18 U/L (ref 10–35)
Albumin: 4 g/dL (ref 3.6–5.1)
Alkaline phosphatase (APISO): 50 U/L (ref 31–125)
BUN: 16 mg/dL (ref 7–25)
CO2: 21 mmol/L (ref 20–32)
Calcium: 9.1 mg/dL (ref 8.6–10.2)
Chloride: 105 mmol/L (ref 98–110)
Creat: 0.7 mg/dL (ref 0.50–0.99)
Globulin: 3.2 g/dL (ref 1.9–3.7)
Glucose, Bld: 163 mg/dL — ABNORMAL HIGH (ref 65–99)
Potassium: 4.4 mmol/L (ref 3.5–5.3)
Sodium: 136 mmol/L (ref 135–146)
Total Bilirubin: 0.2 mg/dL (ref 0.2–1.2)
Total Protein: 7.2 g/dL (ref 6.1–8.1)

## 2023-11-23 LAB — LIPID PANEL
Cholesterol: 246 mg/dL — ABNORMAL HIGH (ref <200)
HDL: 31 mg/dL — ABNORMAL LOW (ref >=50)
LDL Cholesterol (Calc): 184 mg/dL — ABNORMAL HIGH
Non-HDL Cholesterol (Calc): 215 mg/dL — ABNORMAL HIGH (ref <130)
Total CHOL/HDL Ratio: 7.9 (calc) — ABNORMAL HIGH (ref <5.0)
Triglycerides: 159 mg/dL — ABNORMAL HIGH (ref <150)

## 2023-11-23 LAB — MICROALBUMIN / CREATININE URINE RATIO
Creatinine, Urine: 90 mg/dL (ref 20–275)
Microalb Creat Ratio: 14 mg/g{creat} (ref <30)
Microalb, Ur: 1.3 mg/dL

## 2023-11-25 ENCOUNTER — Encounter: Payer: Self-pay | Admitting: "Endocrinology

## 2023-11-25 ENCOUNTER — Ambulatory Visit (INDEPENDENT_AMBULATORY_CARE_PROVIDER_SITE_OTHER): Payer: 59 | Admitting: "Endocrinology

## 2023-11-25 VITALS — BP 180/102 | Wt 169.6 lb

## 2023-11-25 DIAGNOSIS — Z7984 Long term (current) use of oral hypoglycemic drugs: Secondary | ICD-10-CM | POA: Diagnosis not present

## 2023-11-25 DIAGNOSIS — E782 Mixed hyperlipidemia: Secondary | ICD-10-CM

## 2023-11-25 DIAGNOSIS — Z794 Long term (current) use of insulin: Secondary | ICD-10-CM | POA: Diagnosis not present

## 2023-11-25 DIAGNOSIS — E1165 Type 2 diabetes mellitus with hyperglycemia: Secondary | ICD-10-CM

## 2023-11-25 MED ORDER — SEMAGLUTIDE(0.25 OR 0.5MG/DOS) 2 MG/3ML ~~LOC~~ SOPN: 0.2500 mg | PEN_INJECTOR | SUBCUTANEOUS | 1 refills | Status: DC

## 2023-11-25 MED ORDER — METFORMIN HCL 500 MG PO TABS: 1000.0000 mg | ORAL_TABLET | Freq: Two times a day (BID) | ORAL | 4 refills | Status: DC

## 2023-11-25 NOTE — Patient Instructions (Signed)

## 2023-11-25 NOTE — Progress Notes (Signed)
Outpatient Endocrinology Note Amber Ivy, MD  11/25/23   Amber Rose 1976/03/29 [id number]  Referring Provider: Jerene Bears, MD Primary Care Provider: Hoy Register, MD Reason for consultation: Subjective   Assessment & Plan  Diagnoses and all orders for this visit:  Uncontrolled type 2 diabetes mellitus with hyperglycemia (HCC) -     Ambulatory referral to diabetic education  Long term (current) use of oral hypoglycemic drugs  Long-term insulin use (HCC)  Mixed hypercholesterolemia and hypertriglyceridemia  Other orders -     Semaglutide,0.25 or 0.5MG /DOS, 2 MG/3ML SOPN; Inject 0.25 mg into the skin once a week. -     metFORMIN (GLUCOPHAGE) 500 MG tablet; Take 2 tablets (1,000 mg total) by mouth 2 (two) times daily with a meal.    Diabetes Type II complicated by hyperglycemia,  Lab Results  Component Value Date   GFR 106.09 02/20/2022   Hba1c goal less than 7, current Hba1c is  Lab Results  Component Value Date   HGBA1C 7.7 (H) 11/22/2023   Will recommend the following: Lantus 26 units qpm Metformin 500mg  bid, increase it to 1+2 and after 1 week to 2+2 Resume ozempic at 0.25 mg per week after metformin dose escalation is accomplished  Ordered DM education  No known contraindications/side effects to any of above medications No history of MEN syndrome/medullary thyroid cancer/pancreatitis or pancreatic cancer in self or family Stopped ozempic 2mg  3 mo ago due to weight loss-lost 30-40 lbs  Will attempt max dose of ozempic at 1 mg to avoid excess weight loss   -Last LD and Tg are as follows: Lab Results  Component Value Date   LDLCALC 184 (H) 11/22/2023    Lab Results  Component Value Date   TRIG 159 (H) 11/22/2023   -not on statin  -Follow low fat diet and exercise   -Blood pressure goal <140/90 - Microalbumin/creatinine goal is < 30 -Last MA/Cr is as follows: Lab Results  Component Value Date   MICROALBUR 1.3 11/22/2023    -not on ACE/ARB  -diet changes including salt restriction -limit eating outside -counseled BP targets per standards of diabetes care -uncontrolled blood pressure can lead to retinopathy, nephropathy and cardiovascular and atherosclerotic heart disease  Reviewed and counseled on: -A1C target -Blood sugar targets -Complications of uncontrolled diabetes  -Checking blood sugar before meals and bedtime and bring log next visit -All medications with mechanism of action and side effects -Hypoglycemia management: rule of 15's, Glucagon Emergency Kit and medical alert ID -low-carb low-fat plate-method diet -At least 20 minutes of physical activity per day -Annual dilated retinal eye exam and foot exam -compliance and follow up needs -follow up as scheduled or earlier if problem gets worse  Call if blood sugar is less than 70 or consistently above 250    Take a 15 gm snack of carbohydrate at bedtime before you go to sleep if your blood sugar is less than 100.    If you are going to fast after midnight for a test or procedure, ask your physician for instructions on how to reduce/decrease your insulin dose.    Call if blood sugar is less than 70 or consistently above 250  -Treating a low sugar by rule of 15  (15 gms of sugar every 15 min until sugar is more than 70) If you feel your sugar is low, test your sugar to be sure If your sugar is low (less than 70), then take 15 grams of a fast acting Carbohydrate (3-4 glucose  tablets or glucose gel or 4 ounces of juice or regular soda) Recheck your sugar 15 min after treating low to make sure it is more than 70 If sugar is still less than 70, treat again with 15 grams of carbohydrate          Don't drive the hour of hypoglycemia  If unconscious/unable to eat or drink by mouth, use glucagon injection or nasal spray baqsimi and call 911. Can repeat again in 15 min if still unconscious.  Return in about 4 weeks (around 12/23/2023).   I have  reviewed current medications, nurse's notes, allergies, vital signs, past medical and surgical history, family medical history, and social history for this encounter. Counseled patient on symptoms, examination findings, lab findings, imaging results, treatment decisions and monitoring and prognosis. The patient understood the recommendations and agrees with the treatment plan. All questions regarding treatment plan were fully answered.  Amber Tate, MD  11/25/23    History of Present Illness Amber Rose is a 48 y.o. year old female who presents for evaluation of Type II diabetes mellitus.  Amber Rose was first diagnosed in around 2015.   Diabetes education +  Works at night 4 pm -7 am Sleeps [place]-5am, 8:30am-11:30am  8am is BF, lunch is around noon, dinner at 5:30pm and 9 pm is snack   Home diabetes regimen: Lantus 26 units qpm Metformin 500mg  bid  Stopped ozempic 2mg  3 mo ago due to weight loss-lost 30-40 lbs   COMPLICATIONS -  MI/Stroke -  retinopathy -  neuropathy -  nephropathy  SYMPTOMS REVIEWED + Polyuria - Weight loss + Blurred vision  BLOOD SUGAR DATA  CGM interpretation: At today's visit, we reviewed her CGM downloads. The full report is scanned in the media. Reviewing the CGM trends, BG are high at night after 9 pm until 6 am.    Physical Exam  BP (!) 180/102   Wt 169 lb 9.6 oz (76.9 kg)   BMI 28.89 kg/m    Constitutional: well developed, well nourished Head: normocephalic, atraumatic Eyes: sclera anicteric, no redness Neck: supple Lungs: normal respiratory effort Neurology: alert and oriented Skin: dry, no appreciable rashes Musculoskeletal: no appreciable defects Psychiatric: normal mood and affect Diabetic Foot Exam - Simple   No data filed      Current Medications Patient's Medications  New Prescriptions   SEMAGLUTIDE,0.25 OR 0.5MG /DOS, 2 MG/3ML SOPN    Inject 0.25 mg into the skin once a week.  Previous Medications    ACYCLOVIR (ZOVIRAX) 400 MG TABLET    TAKE 1 TABLET BY MOUTH TWICE  DAILY   ALBUTEROL (PROAIR HFA) 108 (90 BASE) MCG/ACT INHALER    Inhale 2 puffs into the lungs every 4 (four) hours as needed for wheezing or shortness of breath.   BLOOD GLUCOSE MONITORING SUPPL (ACCU-CHEK AVIVA PLUS) W/DEVICE KIT    USE AS DIRECTED DAILY. E11.9   BUPROPION (WELLBUTRIN XL) 150 MG 24 HR TABLET    TAKE 1 TABLET BY MOUTH EVERY DAY   CONTINUOUS BLOOD GLUC RECEIVER (FREESTYLE LIBRE 2 READER) DEVI    Use to check blood sugar three times daily. E11.49   CONTINUOUS GLUCOSE SENSOR (FREESTYLE LIBRE 2 SENSOR) MISC    Use to check blood sugar three times daily. Change sensors once every 14 days. E11.49   GLUCOSE BLOOD (FREESTYLE TEST STRIPS) TEST STRIP    Use to check blood sugar three times daily. E11.49   HYDROXYZINE (ATARAX) 50 MG TABLET    Take 1 tablet (50 mg  total) by mouth 3 (three) times daily as needed.   IBUPROFEN (ADVIL) 800 MG TABLET    TAKE 1 TABLET BY MOUTH EVERY 12  HOURS AS NEEDED   INSULIN GLARGINE (LANTUS SOLOSTAR) 100 UNIT/ML SOLOSTAR PEN    Inject 25 Units into the skin daily. Increase by 2 units to a maximum daily dose of 30 units every 4th day until blood sugars are at goal   INSULIN PEN NEEDLE 31G X 5 MM MISC    1 each by Does not apply route at bedtime.   LACTIC AC-CITRIC AC-POT BITART (PHEXXI) 1.8-1-0.4 % GEL    Place 1 Applicatorful vaginally as directed. Place in vagina one hour prior to intercourse   LANCET DEVICES (ACCU-CHEK SOFTCLIX) LANCETS    Use as instructed daily.   LANCETS (FREESTYLE) LANCETS    Use to check blood sugar three times daily. E11.49   METHOCARBAMOL (ROBAXIN) 750 MG TABLET    Take 1 tablet (750 mg total) by mouth every 8 (eight) hours as needed for muscle spasms.   NORETHINDRONE (AYGESTIN) 5 MG TABLET    Take 5 mg by mouth 3 (three) times daily.   OMEPRAZOLE (PRILOSEC) 40 MG CAPSULE    TAKE 1 CAPSULE BY MOUTH EVERY  MORNING   PRASTERONE (INTRAROSA) 6.5 MG INST    Place 1  suppository vaginally at bedtime as needed.   PREGABALIN (LYRICA) 100 MG CAPSULE    TAKE 1 CAPSULE BY MOUTH TWICE  DAILY   SACCHAROMYCES BOULARDII (FLORASTOR) 250 MG CAPSULE    Take 1 capsule (250 mg total) by mouth 2 (two) times daily.   SPIRONOLACTONE (ALDACTONE) 50 MG TABLET    Take 1 tablet (50 mg total) by mouth daily.  Modified Medications   Modified Medication Previous Medication   METFORMIN (GLUCOPHAGE) 500 MG TABLET metFORMIN (GLUCOPHAGE) 500 MG tablet      Take 2 tablets (1,000 mg total) by mouth 2 (two) times daily with a meal.    Take 1 tablet (500 mg total) by mouth 2 (two) times daily with a meal.  Discontinued Medications   OZEMPIC, 2 MG/DOSE, 8 MG/3ML SOPN    INJECT SUBCUTANEOUSLY 2 MG  WEEKLY AS DIRECTED    Allergies Allergies  Allergen Reactions   Amlodipine     Leg pain, swelling around eyes    Atorvastatin Other (See Comments)     Joint pain   Crestor [Rosuvastatin]     Joint pain    Irbesartan     Leg pain, swelling around eyes   Metformin And Related Nausea And Vomiting   Metronidazole Nausea And Vomiting   Penicillins     childhood   Xanax [Alprazolam]     "Caused upper respiratory symptoms" per pt   Glyburide Other (See Comments)    "blood sugar dropped uncontrollably"   Linagliptin Diarrhea and Other (See Comments)    Stomach pain, sinus infection   Moxifloxacin Diarrhea    Past Medical History Past Medical History:  Diagnosis Date   Abnormal Pap smear of cervix    yrs ago   Adenomyosis    Allergy    Anemia    Anxiety    Aortic atherosclerosis (HCC)    Arthritis    Asthma    Chronic UTI    Clostridium difficile infection    Depression    Diabetes mellitus without complication (HCC) 10/22/2023   A1C 8.8   Diverticulosis    Endometriosis    Fibroid    Fibromyalgia    GERD (gastroesophageal  reflux disease)    Hiatal hernia    Hyperlipidemia    Hypertension    Internal hemorrhoids    Meningitis, viral    Migraines    Pancreatitis     Pure hypercholesterolemia 12/23/2020   Tubular adenoma of colon     Past Surgical History Past Surgical History:  Procedure Laterality Date   APPENDECTOMY  1990   CERVICAL BIOPSY  W/ LOOP ELECTRODE EXCISION     LEEP   CESAREAN SECTION  2002   CHOLECYSTECTOMY  2011   COLONOSCOPY     UPPER GASTROINTESTINAL ENDOSCOPY     URINARY SURGERY     urethra sling, removal, and then revision 2016 (4 surgeries)   WISDOM TOOTH EXTRACTION      Family History family history includes Cancer in her mother; Diabetes in her father; Heart attack in her maternal grandfather; Hypertension in her mother; Multiple sclerosis in her paternal grandmother.  Social History Social History   Socioeconomic History   Marital status: Single    Spouse name: Not on file   Number of children: 3   Years of education: Not on file   Highest education level: Not on file  Occupational History   Not on file  Tobacco Use   Smoking status: Every Day    Current packs/day: 0.10    Average packs/day: 0.1 packs/day for 20.0 years (2.0 ttl pk-yrs)    Types: Cigarettes   Smokeless tobacco: Never   Tobacco comments:    Patient is engaged in health coaching for smoking cessation as of 12/26/20  Vaping Use   Vaping status: Never Used  Substance and Sexual Activity   Alcohol use: Yes    Comment: occ   Drug use: Yes    Types: Marijuana    Comment: occ   Sexual activity: Yes    Partners: Male    Birth control/protection: Surgical    Comment: BTL  Other Topics Concern   Not on file  Social History Narrative   Lives home with adult niece.  Not working.  Disability pending.  Pt is single.  Education GED.  3 children.    Social Drivers of Health   Financial Resource Strain: Medium Risk (09/20/2023)   Overall Financial Resource Strain (CARDIA)    Difficulty of Paying Living Expenses: Somewhat hard  Food Insecurity: Food Insecurity Present (09/20/2023)   Hunger Vital Sign    Worried About Running Out of Food in the  Last Year: Sometimes true    Ran Out of Food in the Last Year: Sometimes true  Transportation Needs: No Transportation Needs (09/20/2023)   PRAPARE - Administrator, Civil Service (Medical): No    Lack of Transportation (Non-Medical): No  Physical Activity: Insufficiently Active (09/20/2023)   Exercise Vital Sign    Days of Exercise per Week: 3 days    Minutes of Exercise per Session: 30 min  Stress: Stress Concern Present (09/20/2023)   [employer] of Occupational Health - Occupational Stress Questionnaire    Feeling of Stress : To some extent  Social Connections: Socially Isolated (09/20/2023)   Social Connection and Isolation Panel [NHANES]    Frequency of Communication with Friends and Family: Three times a week    Frequency of Social Gatherings with Friends and Family: Once a week    Attends Religious Services: Never    Database administrator or Organizations: No    Attends Banker Meetings: Never    Marital Status: Never married  Catering manager  Violence: Not At Risk (03/06/2023)   Humiliation, Afraid, Rape, and Kick questionnaire    Fear of Current or Ex-Partner: No    Emotionally Abused: No    Physically Abused: No    Sexually Abused: No    Lab Results  Component Value Date   HGBA1C 7.7 (H) 11/22/2023   HGBA1C 8.8 (H) 10/22/2023   HGBA1C 9.2 (H) 10/13/2023   Lab Results  Component Value Date   CHOL 246 (H) 11/22/2023   Lab Results  Component Value Date   HDL 31 (L) 11/22/2023   Lab Results  Component Value Date   LDLCALC 184 (H) 11/22/2023   Lab Results  Component Value Date   TRIG 159 (H) 11/22/2023   Lab Results  Component Value Date   CHOLHDL 7.9 (H) 11/22/2023   Lab Results  Component Value Date   CREATININE 0.70 11/22/2023   Lab Results  Component Value Date   GFR 106.09 02/20/2022   Lab Results  Component Value Date   MICROALBUR 1.3 11/22/2023      Component Value Date/Time   NA 136 11/22/2023 0950   NA  139 09/16/2023 1035   K 4.4 11/22/2023 0950   CL 105 11/22/2023 0950   CO2 21 11/22/2023 0950   GLUCOSE 163 (H) 11/22/2023 0950   BUN 16 11/22/2023 0950   BUN 19 09/16/2023 1035   CREATININE 0.70 11/22/2023 0950   CALCIUM 9.1 11/22/2023 0950   PROT 7.2 11/22/2023 0950   PROT 6.5 12/01/2022 0955   ALBUMIN 4.1 08/30/2023 1052   ALBUMIN 3.9 12/01/2022 0955   AST 18 11/22/2023 0950   AST 13 (L) 08/30/2023 1052   ALT 22 11/22/2023 0950   ALT 14 08/30/2023 1052   ALKPHOS 59 08/30/2023 1052   BILITOT 0.2 11/22/2023 0950   BILITOT 0.6 08/30/2023 1052   GFRNONAA >60 08/30/2023 1052   GFRNONAA >89 10/28/2016 0851   GFRAA 127 12/12/2020 1149   GFRAA >89 10/28/2016 0851      Latest Ref Rng & Units 11/22/2023    9:50 AM 09/16/2023   10:35 AM 08/30/2023   10:52 AM  BMP  Glucose 65 - 99 mg/dL 829  562  130   BUN 7 - 25 mg/dL 16  19  21    Creatinine 0.50 - 0.99 mg/dL 8.65  7.84  6.96   BUN/Creat Ratio 6 - 22 (calc) SEE NOTE:  21    Sodium 135 - 146 mmol/L 136  139  134   Potassium 3.5 - 5.3 mmol/L 4.4  4.5  4.8   Chloride 98 - 110 mmol/L 105  103  101   CO2 20 - 32 mmol/L 21  21  28    Calcium 8.6 - 10.2 mg/dL 9.1  9.7  9.8        Component Value Date/Time   WBC 8.5 11/18/2023 1006   RBC 4.57 11/18/2023 1006   HGB 14.7 11/18/2023 1006   HGB 13.9 06/02/2023 1117   HGB 10.8 (L) 02/05/2023 1147   HCT 44.0 11/18/2023 1006   HCT 33.9 (L) 02/05/2023 1147   PLT 288 11/18/2023 1006   PLT 279 06/02/2023 1117   PLT 312 02/05/2023 1147   MCV 96.3 11/18/2023 1006   MCV 89 02/05/2023 1147   MCH 32.2 11/18/2023 1006   MCHC 33.4 11/18/2023 1006   RDW 12.7 11/18/2023 1006   RDW 14.8 02/05/2023 1147   LYMPHSABS 2.6 08/30/2023 1052   LYMPHSABS 2.0 02/05/2023 1147   MONOABS 0.6 08/30/2023 1052  EOSABS 0.1 08/30/2023 1052   EOSABS 0.0 02/05/2023 1147   BASOSABS 0.0 08/30/2023 1052   BASOSABS 0.0 02/05/2023 1147     Parts of this note may have been dictated using voice recognition  software. There may be variances in spelling and vocabulary which are unintentional. Not all errors are proofread. Please notify the Thereasa Parkin if any discrepancies are noted or if the meaning of any statement is not clear.

## 2023-11-26 ENCOUNTER — Encounter: Payer: Self-pay | Admitting: "Endocrinology

## 2023-11-26 ENCOUNTER — Other Ambulatory Visit: Payer: Self-pay | Admitting: "Endocrinology

## 2023-11-26 ENCOUNTER — Encounter: Payer: Self-pay | Admitting: Internal Medicine

## 2023-11-27 ENCOUNTER — Encounter: Payer: Self-pay | Admitting: Adult Health

## 2023-11-29 ENCOUNTER — Encounter: Payer: 59 | Attending: "Endocrinology | Admitting: Dietician

## 2023-11-29 ENCOUNTER — Encounter: Payer: Self-pay | Admitting: Dietician

## 2023-11-29 VITALS — Ht 63.5 in | Wt 171.5 lb

## 2023-11-29 DIAGNOSIS — E119 Type 2 diabetes mellitus without complications: Secondary | ICD-10-CM | POA: Insufficient documentation

## 2023-11-29 NOTE — Progress Notes (Signed)
Diabetes Self-Management Education  Visit Type: First/Initial  Appt. Start Time: 1120 Appt. End Time: 1240  11/29/2023  Amber Rose, identified by name and date of birth, is a 48 y.o. female with a diagnosis of Diabetes: Type 2.   ASSESSMENT  Height 5' 3.5" (1.613 m), weight 171 lb 8 oz (77.8 kg). Body mass index is 29.9 kg/m.   Pt reports history of GDM in 2003, current goal of glycemic control and weight maintenance. Pt reports taking metformin @1 ,500 mg daily, Lantus @26  units, and Ozempic @0 .25 weekly for DM. Pt reports slight stomach discomfort since increasing metformin. Pt reports concerns over dietary intake for glycemic control, reports history of hypoglycemic events, states they originally tried eliminating all carbs, but have been reintroducing carbs and feel much better, but now and is seeing PPBGs as high as 250 and is trying to figure out what foods cause hyperglycemia. Pt reports wearing Libre2, finds it very helpful in improving glycemic control. 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 Pt states their schedule makes it difficult to keep blood sugar consistent on days off.   Diabetes Self-Management Education - 11/29/23 1428       Visit Information   Visit Type First/Initial      Initial Visit   Diabetes Type Type 2    Date Diagnosed 2015    Are you currently following a meal plan? Yes    What type of meal plan do you follow? Low carb    Are you taking your medications as prescribed? Yes      Health Coping   How would you rate your overall health? Fair      Psychosocial Assessment   Patient Belief/Attitude about Diabetes Defeat/Burnout    What is the hardest part about your diabetes right now, causing you the most concern, or is the most worrisome to you about your diabetes?   Making healty food and beverage choices     Self-care barriers None;Debilitated state due to current medical condition    Self-management support Doctor's office;Friends    Other persons present Patient    Patient Concerns Glycemic Control;Nutrition/Meal planning;Monitoring    Special Needs None    Preferred Learning Style Other (comment)    Learning Readiness Ready    How often do you need to have someone help you when you read instructions, pamphlets, or other written materials from your doctor or pharmacy? 1 - Never    What is the last grade level you completed in school? GED      Pre-Education Assessment   Patient understands the diabetes disease and treatment process. Needs Review    Patient understands incorporating nutritional management into lifestyle. Needs Review    Patient undertands incorporating physical activity into lifestyle. Needs Review    Patient understands using medications safely. Needs Review    Patient understands monitoring blood glucose, interpreting and using results Needs Review    Patient understands prevention, detection, and treatment of acute complications. Needs Review    Patient understands prevention, detection, and treatment of chronic complications. Needs Review    Patient understands how to develop strategies to address psychosocial issues. Needs Review    Patient understands how to develop strategies to promote health/change behavior. Needs Review      Complications   Last HgB A1C per patient/outside source 7.7 %  11/22/2023   Fasting Blood glucose range (mg/dL) 16-109;604-540;981-191    Postprandial Blood glucose range (mg/dL) >478;295-621;308-657    Number of hyperglycemic episodes ( >200mg /dL): Weekly    Can you tell when your blood sugar is high? No    Have you had a dilated eye exam in the past 12 months? Yes    Have you had a dental exam in the past 12 months? Yes    Are you checking your feet? Yes      Activity / Exercise   Activity / Exercise Type ADL's;Moderate (swimming /  aerobic walking)    How many days per week do you exercise? 3    How many minutes per day do you exercise? 40    Total minutes per week of exercise 120      Patient Education   Previous Diabetes Education Yes (please comment)    Disease Pathophysiology Definition of diabetes, type 1 and 2, and the diagnosis of diabetes;Explored patient's options for treatment of their diabetes;Factors that contribute to the development of diabetes    Healthy Eating Role of diet in the treatment of diabetes and the relationship between the three main macronutrients and blood glucose level;Reviewed blood glucose goals for pre and post meals and how to evaluate the patients' food intake on their blood glucose level.;Information on hints to eating out and maintain blood glucose control.;Meal options for control of blood glucose level and chronic complications.;Plate Method    Being Active Role of exercise on diabetes management, blood pressure control and cardiac health.    Medications Reviewed medication adjustment guidelines for hyperglycemia and sick days.;Taught/reviewed insulin/injectables, injection, site rotation, insulin/injectables storage and needle disposal.;Reviewed patients medication for diabetes, action, purpose, timing of dose and side effects.    Monitoring Taught/evaluated CGM (comment)   Educated pt on CGM app and how to evaluate data   Acute complications Discussed and identified patients' prevention, symptoms, and treatment of hyperglycemia.;Taught prevention, symptoms, and  treatment of hypoglycemia - the 15 rule.    Chronic complications Relationship between chronic complications and blood glucose control;Identified and discussed with patient  current chronic complications;Retinopathy and reason for yearly dilated eye exams;Assessed and discussed foot care and prevention of foot problems    Diabetes Stress and Support Role of stress on diabetes    Lifestyle and Health Coping Lifestyle issues that  need to be addressed for better diabetes care      Individualized Goals (developed by patient)   Nutrition Follow meal plan discussed;General guidelines for healthy choices and portions discussed;Adjust meds/carbs with exercise as discussed    Physical Activity Exercise 3-5 times per week    Medications take my medication as prescribed    Monitoring  Consistenly use CGM    Problem Solving Sleep Pattern;Eating Pattern    Reducing Risk examine blood glucose patterns;treat hypoglycemia with 15 grams of carbs if blood glucose less than 70mg /dL      Post-Education Assessment   Patient understands the diabetes disease and treatment process. Comprehends key points    Patient understands incorporating nutritional management into lifestyle. Comprehends key points    Patient undertands incorporating physical activity into lifestyle. Comprehends key points    Patient understands using medications safely. Comphrehends key points    Patient understands monitoring blood glucose, interpreting and using results Comprehends key points    Patient understands prevention, detection, and treatment of acute complications. Comprehends key points    Patient understands prevention, detection, and treatment of chronic complications. Comprehends key points  Patient understands how to develop strategies to address psychosocial issues. Comprehends key points    Patient understands how to develop strategies to promote health/change behavior. Comprehends key points      Outcomes   Expected Outcomes Demonstrated interest in learning. Expect positive outcomes    Future DMSE 3-4 months    Program Status Not Completed             Individualized Plan for Diabetes Self-Management Training:   Learning Objective:  Patient will have a greater understanding of diabetes self-management. Patient education plan is to attend individual and/or group sessions per assessed needs and concerns.   Plan:   Patient Instructions   Look into the Out [employer] the Garden project for fresh and healthier food assistance options.  Check your blood sugar each morning before eating or drinking (fasting). Look for numbers under 130 mg/dL Check your blood sugar 2 hours after you begin eating a meal. Look for numbers under 180 mg/dL at all times.  Your goal A1c is below 7.0%  Work towards eating three meals a day, about 5-6 hours apart!  Begin to recognize carbohydrates, proteins, and non-starchy vegetables in your food choices!  Begin to build your meals using the proportions of the Balanced Plate. First, select your carb choice(s) for the meal. Make this 25% of your meal. Next, select your source of protein to pair with your carb choice(s). Make this another 25% of your meal. Finally, complete your meal with a variety of non-starchy vegetables. Make this the remaining 50% of your meal.   Expected Outcomes:  Demonstrated interest in learning. Expect positive outcomes  Education material provided: My Plate, Carbohydrate Food List, Protein Food List  If problems or questions, patient to contact team via:  Phone and Email  Future DSME appointment: 3-4 months

## 2023-11-29 NOTE — Patient Instructions (Signed)
Look into the [employer] of the Garden project for fresh and healthier food assistance options.  Check your blood sugar each morning before eating or drinking (fasting). Look for numbers under 130 mg/dL Check your blood sugar 2 hours after you begin eating a meal. Look for numbers under 180 mg/dL at all times.  Your goal A1c is below 7.0%  Work towards eating three meals a day, about 5-6 hours apart!  Begin to recognize carbohydrates, proteins, and non-starchy vegetables in your food choices!  Begin to build your meals using the proportions of the Balanced Plate. First, select your carb choice(s) for the meal. Make this 25% of your meal. Next, select your source of protein to pair with your carb choice(s). Make this another 25% of your meal. Finally, complete your meal with a variety of non-starchy vegetables. Make this the remaining 50% of your meal.

## 2023-12-03 ENCOUNTER — Telehealth: Payer: Self-pay | Admitting: Family Medicine

## 2023-12-03 NOTE — Telephone Encounter (Signed)
Pt has been scheduled and informed of appointment date and time.

## 2023-12-03 NOTE — Telephone Encounter (Signed)
 Please schedule for Pre-op evaluationfor hysterectomy. Thanks

## 2023-12-06 ENCOUNTER — Encounter: Payer: Self-pay | Admitting: Hematology and Oncology

## 2023-12-09 ENCOUNTER — Encounter: Payer: Self-pay | Admitting: Obstetrics and Gynecology

## 2023-12-10 ENCOUNTER — Ambulatory Visit: Payer: 59 | Admitting: Pharmacist

## 2023-12-10 ENCOUNTER — Encounter: Payer: Self-pay | Admitting: Family Medicine

## 2023-12-10 ENCOUNTER — Other Ambulatory Visit: Payer: Self-pay | Admitting: Family Medicine

## 2023-12-10 DIAGNOSIS — E1149 Type 2 diabetes mellitus with other diabetic neurological complication: Secondary | ICD-10-CM

## 2023-12-11 ENCOUNTER — Telehealth: Payer: 59 | Admitting: Family Medicine

## 2023-12-11 DIAGNOSIS — E162 Hypoglycemia, unspecified: Secondary | ICD-10-CM | POA: Diagnosis not present

## 2023-12-11 NOTE — Patient Instructions (Signed)
Amber Rose, thank you for joining [name], PA-C for today's virtual visit.  While this provider is not your primary care provider (PCP), if your PCP is located in our provider database this encounter information will be shared with them immediately following your visit.   A [facility] MyChart account gives you access to today's visit and all your visits, tests, and labs performed at [facility] " click here if you don't have a [facility] MyChart account or go to [email or web address]  Consent: (Patient) Amber Rose provided verbal consent for this virtual visit at the beginning of the encounter.  Current Medications:  Current Outpatient Medications:    acyclovir (ZOVIRAX) 400 MG tablet, TAKE 1 TABLET BY MOUTH TWICE  DAILY, Disp: 180 tablet, Rfl: 1   albuterol (PROAIR HFA) 108 (90 Base) MCG/ACT inhaler, Inhale 2 puffs into the lungs every 4 (four) hours as needed for wheezing or shortness of breath., Disp: 3 Inhaler, Rfl: 0   Blood Glucose Monitoring Suppl (ACCU-CHEK AVIVA PLUS) w/Device KIT, USE AS DIRECTED DAILY. E11.9, Disp: 1 kit, Rfl: 0   buPROPion (WELLBUTRIN XL) 150 MG 24 hr tablet, TAKE 1 TABLET BY MOUTH EVERY DAY, Disp: 90 tablet, Rfl: 1   Continuous Blood Gluc Receiver (FREESTYLE LIBRE 2 READER) DEVI, Use to check blood sugar three times daily. E11.49, Disp: 1 each, Rfl: 0   Continuous Glucose Sensor (FREESTYLE LIBRE 2 SENSOR) MISC, USE TO CHECK BLOOD SUGAR 3 TIMES A DAY - CHANGE SENSOR EVERY 14 DAYS, Disp: 2 each, Rfl: 41   glucose blood (FREESTYLE TEST STRIPS) test strip, Use to check blood sugar three times daily. E11.49, Disp: 100 each, Rfl: 3   hydrOXYzine (ATARAX) 50 MG tablet, Take 1 tablet (50 mg total) by mouth 3 (three) times daily as needed., Disp: 30 tablet, Rfl: 5   ibuprofen (ADVIL) 800 MG tablet, TAKE 1 TABLET BY MOUTH EVERY 12  HOURS AS NEEDED, Disp: 90 tablet, Rfl: 1   insulin glargine (LANTUS SOLOSTAR) 100 UNIT/ML Solostar Pen,  Inject 25 Units into the skin daily. Increase by 2 units to a maximum daily dose of 30 units every 4th day until blood sugars are at goal, Disp: 15 mL, Rfl: 6   Insulin Pen Needle 31G X 5 MM MISC, 1 each by Does not apply route at bedtime., Disp: 30 each, Rfl: 5   Lactic Ac-Citric Ac-Pot Bitart (PHEXXI) 1.8-1-0.4 % GEL, Place 1 Applicatorful vaginally as directed. Place in vagina one hour prior to intercourse, Disp: 5 g, Rfl: 12   Lancet Devices (ACCU-CHEK SOFTCLIX) lancets, Use as instructed daily., Disp: 1 each, Rfl: 5   Lancets (FREESTYLE) lancets, Use to check blood sugar three times daily. E11.49, Disp: 100 each, Rfl: 3   metFORMIN (GLUCOPHAGE) 500 MG tablet, Take 2 tablets (1,000 mg total) by mouth 2 (two) times daily with a meal., Disp: 120 tablet, Rfl: 4   methocarbamol (ROBAXIN) 750 MG tablet, Take 1 tablet (750 mg total) by mouth every 8 (eight) hours as needed for muscle spasms., Disp: 90 tablet, Rfl: 3   norethindrone (AYGESTIN) 5 MG tablet, Take 5 mg by mouth 3 (three) times daily., Disp: , Rfl:    omeprazole (PRILOSEC) 40 MG capsule, TAKE 1 CAPSULE BY MOUTH EVERY  MORNING, Disp: 100 capsule, Rfl: 0   Prasterone (INTRAROSA) 6.5 MG INST, Place 1 suppository vaginally at bedtime as needed. (Patient not taking: Reported on 11/18/2023), Disp: 30 each, Rfl: 12   pregabalin (LYRICA) 100 MG capsule, TAKE 1 CAPSULE BY MOUTH  TWICE  DAILY, Disp: 60 capsule, Rfl: 5   saccharomyces boulardii (FLORASTOR) 250 MG capsule, Take 1 capsule (250 mg total) by mouth 2 (two) times daily., Disp: 60 capsule, Rfl: 0   Semaglutide,0.25 or 0.5MG /DOS, 2 MG/3ML SOPN, Inject 0.25 mg into the skin once a week., Disp: 3 mL, Rfl: 1   spironolactone (ALDACTONE) 50 MG tablet, Take 1 tablet (50 mg total) by mouth daily., Disp: 90 tablet, Rfl: 3   Medications ordered in this encounter:  No orders of the defined types were placed in this encounter.    *If you need refills on other medications prior to your next  appointment, please contact your pharmacy*  Follow-Up: Call back or seek an in-person evaluation if the symptoms worsen or if the condition fails to improve as anticipated.  [facility] [phone]  Other Instructions Hypoglycemia Hypoglycemia is when the amount of sugar, or glucose, in your blood is too low. Low blood sugar can happen if you have diabetes or if you don't have diabetes. It may be an emergency. What are the causes? Low blood sugar happens most often in people who have diabetes. It may be caused by: Diabetes medicine. Not eating enough, or not eating often enough. Being more active than normal. If you don't have diabetes, you may still get low blood sugar if: There's a tumor in your pancreas. A tumor is a growth of cells that isn't normal. You don't eat enough, or you fast. Fasting is when you don't eat for long periods at a time. You have a bad infection or illness. You have problems after weight loss surgery. You have kidney or liver problems. You take certain medicines. What increases the risk? You're more likely to have low blood sugar if: You have diabetes and take medicine for it. You drink a lot of alcohol. You get sick. What are the signs or symptoms? Mild Hunger or feeling like you may vomit. Sweating and feeling cold to the touch. Feeling dizzy or light-headed. Being sleepy or having trouble sleeping. A headache. Blurry vision. Mood changes. These include feeling worried, nervous, or easily annoyed. Moderate Feeling confused. Changes in the way you act. Weakness. An uneven heartbeat. Very bad Having very low blood sugar is an emergency. It can cause: Fainting. Seizures. A coma. Death. How is this diagnosed?  Low blood sugar can be found with a blood test. This test tells you how much sugar is in your blood. It's done while you're having symptoms. Your health care provider may also do an exam and look at your medical  history. How is this treated? Treating low blood sugar If you have low blood sugar, eat or drink something with sugar in it right away. The food or drink should have 15 grams of a fast-acting carbohydrate (carb). Options include: 4 oz (120 mL) of fruit juice. 4 oz (120 mL) of soda (not diet soda). A few pieces of hard candy. Check food labels to see how many pieces to eat. 1 Tbsp (15 mL) of sugar or honey. 4 glucose tablets. 1 tube of glucose gel. Treating low blood sugar if you have diabetes Talk with your provider about how much carb you should take. If you're alert and can swallow safely, you may follow the 15:15 rule: Take 15 grams of a fast-acting carb. Check your blood sugar 15 minutes after you take the carb. If your blood sugar is still at or below 70 mg/dL (3.9 mmol/L), take 15 grams of a carb  again. If your blood sugar doesn't go above 70 mg/dL (3.9 mmol/L) after 3 tries, get help right away. After your blood sugar goes back to normal, eat a meal or a snack within 1 hour. Always keep 15 grams of a fast-acting carb with you. This could be: 4 glucose tablets. A few pieces of hard candy. 1 Tbsp (15 mL) of honey or sugar. 1 tube of glucose gel. Treating very low blood sugar If your blood sugar is less than 54 mg/dL (3 mmol/L), it's an emergency. Get help right away. If you can't eat or drink, you will need to be given glucagon. A family member or friend should learn how to check your blood sugar and give you glucagon. Ask your provider if you should keep a glucagon kit at home. You may also need to be treated in a hospital. Follow these instructions at home: If you have diabetes: Always keep a fast-acting carb (15 grams) with you. Follow your diabetes care plan. Make sure you: Know the symptoms of low blood sugar. Check your blood sugar as often as told. Always check it before and after you exercise. Always check your blood sugar before you drive. Take your medicines as  told. Eat on time. Do not skip meals. Share your diabetes care plan with: Your work or school. The people you live with. Wear an alert bracelet or carry a card that says you have diabetes. General instructions If you drink alcohol: Limit how much you have to: 0-1 drink a day if you're female. 0-2 drinks a day if you're female. Know how much alcohol is in your drink. In the [place] one drink is one 12 oz bottle of beer (355 mL), one 5 oz glass of wine (148 mL), or one 1 oz glass of hard liquor (44 mL). Be sure to eat food when you drink alcohol. Be sure to check your blood sugar after you drink. Alcohol may lead to low blood sugar later. Where to find more information American Diabetes Association (ADA): diabetes.org Contact a health care provider if: You have low blood sugar often. You have diabetes and are having trouble keeping your blood sugar in the right range. Get help right away if: You can't get your blood sugar above 70 mg/dL (3.9 mmol/L) after 3 tries. Your blood sugar is below 54 mg/dL (3 mmol/L). You have a seizure. You faint. These symptoms may be an emergency. Call 911 right away. Do not wait to see if the symptoms will go away. Do not drive yourself to the hospital. This information is not intended to replace advice given to you by your health care provider. Make sure you discuss any questions you have with your health care provider. Document Revised: 07/15/2023 Document Reviewed: 12/30/2022 Elsevier Patient Education  2024 Elsevier Inc.   If you have been instructed to have an in-person evaluation today at a local Urgent Care facility, please use the link below. It will take you to a list of all of our available [facility], including address, phone number and hours of operation. Please do not delay care.  [facility]  If you or a family member do not have a primary care provider, use the link below to schedule a visit and establish care.  When you choose a [facility] primary care physician or advanced practice provider, you gain a long-term partner in health. Find a Primary Care Provider  Learn more about [facility]'s in-office and virtual care options: [facility] -  Get Care Now

## 2023-12-11 NOTE — Progress Notes (Signed)
Virtual Visit Consent   Amber Rose, you are scheduled for a virtual visit with a  provider today. Just as with appointments in the office, your consent must be obtained to participate. Your consent will be active for this visit and any virtual visit you may have with one of our providers in the next 365 days. If you have a MyChart account, a copy of this consent can be sent to you electronically.  As this is a virtual visit, video technology does not allow for your provider to perform a traditional examination. This may limit your provider's ability to fully assess your condition. If your provider identifies any concerns that need to be evaluated in person or the need to arrange testing (such as labs, EKG, etc.), we will make arrangements to do so. Although advances in technology are sophisticated, we cannot ensure that it will always work on either your end or our end. If the connection with a video visit is poor, the visit may have to be switched to a telephone visit. With either a video or telephone visit, we are not always able to ensure that we have a secure connection.  By engaging in this virtual visit, you consent to the provision of healthcare and authorize for your insurance to be billed (if applicable) for the services provided during this visit. Depending on your insurance coverage, you may receive a charge related to this service.  I need to obtain your verbal consent now. Are you willing to proceed with your visit today? Amber Rose has provided verbal consent on 12/11/2023 for a virtual visit (video or telephone). Amber Rose, [place]  Date: 12/11/2023 2:38 PM   Virtual Visit via Video Note   I, Amber Rose, connected with  Amber Rose  ([id number], Sep 24, 1976) on 12/11/23 at  2:30 PM EST by a video-enabled telemedicine application and verified that I am speaking with the correct person using two identifiers.  Location: Patient: Virtual Visit Location  Patient: Home Provider: Virtual Visit Location Provider: Home Office   I discussed the limitations of evaluation and management by telemedicine and the availability of in person appointments. The patient expressed understanding and agreed to proceed.    History of Present Illness: Amber Rose is a 48 y.o. who identifies as a female who was assigned female at birth, and is being seen today for c/o blood sugars keep dropping.  Pt states she has trouble keeping a balance.  Pt states over night it dropped to 50s-60s.  Pt states she is having to managing increasing it with foods. Pt states 26 units of insulin once and 0.25 Ozempic, Metformin 1500mg  a day. Pt states her diet is pretty good. Pt states everything low carb and no sugar. Pt states current blood sugar is 142.   HPI: HPI  Problems:  Patient Active Problem List   Diagnosis Date Noted   Yeast vaginitis 08/30/2023   Iron deficiency anemia 03/11/2023   PMDD (premenstrual dysphoric disorder) 11/17/2022   Abnormal uterine bleeding (AUB) 11/17/2022   Statin myopathy 09/23/2022   Steatorrhea 08/17/2022   Irregular menses 11/28/2021   Perimenopause 11/28/2021   Episode of recurrent major depressive disorder (HCC) 04/09/2021   BMI 33.0-33.9,adult 04/09/2021   Pure hypercholesterolemia 12/23/2020   Menorrhagia with regular cycle 09/13/2020   Well woman exam 09/13/2020   History of herpes zoster 01/17/2018   Overactive bladder 12/28/2017   Recurrent infections 11/19/2017   Cyst of nasal cavity 11/19/2017   Angio-edema 11/19/2017   Abdominal pain, epigastric 11/04/2017  Dysphagia 11/04/2017   Gastroesophageal reflux disease 07/12/2017   Degenerative disc disease at L5-S1 level 05/14/2017   Fibromyalgia 05/14/2017   ADD (attention deficit disorder) 05/14/2017   Urinary incontinence 04/05/2017   Thigh pain 02/02/2017   Anxiety and depression 02/02/2017   Tobacco abuse 02/02/2017   Facial flushing 02/02/2017   Hypertension  10/23/2016   Hidradenitis 10/23/2016   Diabetes (HCC) 09/12/2014   Lupus anticoagulant positive 07/03/2014   Vitamin D deficiency 06/06/2014   Benign essential hypertension 06/06/2014    Allergies:  Allergies  Allergen Reactions   Amlodipine     Leg pain, swelling around eyes    Atorvastatin Other (See Comments)     Joint pain   Crestor [Rosuvastatin]     Joint pain    Irbesartan     Leg pain, swelling around eyes   Metformin And Related Nausea And Vomiting   Metronidazole Nausea And Vomiting   Penicillins     childhood   Xanax [Alprazolam]     "Caused upper respiratory symptoms" per pt   Glyburide Other (See Comments)    "blood sugar dropped uncontrollably"   Linagliptin Diarrhea and Other (See Comments)    Stomach pain, sinus infection   Moxifloxacin Diarrhea   Medications:  Current Outpatient Medications:    acyclovir (ZOVIRAX) 400 MG tablet, TAKE 1 TABLET BY MOUTH TWICE  DAILY, Disp: 180 tablet, Rfl: 1   albuterol (PROAIR HFA) 108 (90 Base) MCG/ACT inhaler, Inhale 2 puffs into the lungs every 4 (four) hours as needed for wheezing or shortness of breath., Disp: 3 Inhaler, Rfl: 0   Blood Glucose Monitoring Suppl (ACCU-CHEK AVIVA PLUS) w/Device KIT, USE AS DIRECTED DAILY. E11.9, Disp: 1 kit, Rfl: 0   buPROPion (WELLBUTRIN XL) 150 MG 24 hr tablet, TAKE 1 TABLET BY MOUTH EVERY DAY, Disp: 90 tablet, Rfl: 1   Continuous Blood Gluc Receiver (FREESTYLE LIBRE 2 READER) DEVI, Use to check blood sugar three times daily. E11.49, Disp: 1 each, Rfl: 0   Continuous Glucose Sensor (FREESTYLE LIBRE 2 SENSOR) MISC, USE TO CHECK BLOOD SUGAR 3 TIMES A DAY - CHANGE SENSOR EVERY 14 DAYS, Disp: 2 each, Rfl: 41   glucose blood (FREESTYLE TEST STRIPS) test strip, Use to check blood sugar three times daily. E11.49, Disp: 100 each, Rfl: 3   hydrOXYzine (ATARAX) 50 MG tablet, Take 1 tablet (50 mg total) by mouth 3 (three) times daily as needed., Disp: 30 tablet, Rfl: 5   ibuprofen (ADVIL) 800 MG  tablet, TAKE 1 TABLET BY MOUTH EVERY 12  HOURS AS NEEDED, Disp: 90 tablet, Rfl: 1   insulin glargine (LANTUS SOLOSTAR) 100 UNIT/ML Solostar Pen, Inject 25 Units into the skin daily. Increase by 2 units to a maximum daily dose of 30 units every 4th day until blood sugars are at goal, Disp: 15 mL, Rfl: 6   Insulin Pen Needle 31G X 5 MM MISC, 1 each by Does not apply route at bedtime., Disp: 30 each, Rfl: 5   Lactic Ac-Citric Ac-Pot Bitart (PHEXXI) 1.8-1-0.4 % GEL, Place 1 Applicatorful vaginally as directed. Place in vagina one hour prior to intercourse, Disp: 5 g, Rfl: 12   Lancet Devices (ACCU-CHEK SOFTCLIX) lancets, Use as instructed daily., Disp: 1 each, Rfl: 5   Lancets (FREESTYLE) lancets, Use to check blood sugar three times daily. E11.49, Disp: 100 each, Rfl: 3   metFORMIN (GLUCOPHAGE) 500 MG tablet, Take 2 tablets (1,000 mg total) by mouth 2 (two) times daily with a meal., Disp: 120 tablet, Rfl:  4   methocarbamol (ROBAXIN) 750 MG tablet, Take 1 tablet (750 mg total) by mouth every 8 (eight) hours as needed for muscle spasms., Disp: 90 tablet, Rfl: 3   norethindrone (AYGESTIN) 5 MG tablet, Take 5 mg by mouth 3 (three) times daily., Disp: , Rfl:    omeprazole (PRILOSEC) 40 MG capsule, TAKE 1 CAPSULE BY MOUTH EVERY  MORNING, Disp: 100 capsule, Rfl: 0   Prasterone (INTRAROSA) 6.5 MG INST, Place 1 suppository vaginally at bedtime as needed. (Patient not taking: Reported on 11/18/2023), Disp: 30 each, Rfl: 12   pregabalin (LYRICA) 100 MG capsule, TAKE 1 CAPSULE BY MOUTH TWICE  DAILY, Disp: 60 capsule, Rfl: 5   saccharomyces boulardii (FLORASTOR) 250 MG capsule, Take 1 capsule (250 mg total) by mouth 2 (two) times daily., Disp: 60 capsule, Rfl: 0   Semaglutide,0.25 or 0.5MG /DOS, 2 MG/3ML SOPN, Inject 0.25 mg into the skin once a week., Disp: 3 mL, Rfl: 1   spironolactone (ALDACTONE) 50 MG tablet, Take 1 tablet (50 mg total) by mouth daily., Disp: 90 tablet, Rfl: 3  Observations/Objective: Patient is  well-developed, well-nourished in no acute distress.  Resting comfortably at home.  Head is normocephalic, atraumatic.  No labored breathing.  Speech is clear and coherent with logical content.  Patient is alert and oriented at baseline.    Assessment and Plan: 1. Hypoglycemia (Primary)  -Advised Pt to continue to monitor sugar levels at home, if unable to increase above 60 and feeling ill to proceed to the emergency room -Also discussed increasing carbohydrates in meals to balance the low sugar levels -Advised Pt to follow up with PCP as soon as possible to adjust diabetes medications.   Follow Up Instructions: I discussed the assessment and treatment plan with the patient. The patient was provided an opportunity to ask questions and all were answered. The patient agreed with the plan and demonstrated an understanding of the instructions.  A copy of instructions were sent to the patient via MyChart unless otherwise noted below.     The patient was advised to call back or seek an in-person evaluation if the symptoms worsen or if the condition fails to improve as anticipated.    Amber Pandy, PA-C

## 2023-12-12 ENCOUNTER — Other Ambulatory Visit: Payer: Self-pay | Admitting: Family Medicine

## 2023-12-12 DIAGNOSIS — M79652 Pain in left thigh: Secondary | ICD-10-CM

## 2023-12-13 ENCOUNTER — Encounter: Payer: Self-pay | Admitting: "Endocrinology

## 2023-12-13 NOTE — Telephone Encounter (Signed)
Spoke with patient. Patient reports spotting since PUS on 11/18/23. Described as brown to bright red with menses like cramps. Taking norethindrone 5mg  tab PO tid, no missed or late pills. Denies SOB, weakness, dizziness, lightheadedness. Patient asking if she can try a different medication? Alternative options?   Patient has open referral for surgery, is working with PCP to control blood sugars prior to scheduling. Patient aware will also need consent form completed prior to surgery.   Advised I will provide update to Dr. Karma Greaser and f/u with recommendations, patient agreeable.

## 2023-12-15 ENCOUNTER — Ambulatory Visit: Payer: 59 | Admitting: Dermatology

## 2023-12-15 NOTE — Telephone Encounter (Signed)
[facility] referral form completed and to Dr. Karma Greaser to be signed and faxed.

## 2023-12-19 ENCOUNTER — Other Ambulatory Visit (HOSPITAL_BASED_OUTPATIENT_CLINIC_OR_DEPARTMENT_OTHER): Payer: Self-pay | Admitting: Obstetrics & Gynecology

## 2023-12-19 DIAGNOSIS — N898 Other specified noninflammatory disorders of vagina: Secondary | ICD-10-CM

## 2023-12-19 DIAGNOSIS — N921 Excessive and frequent menstruation with irregular cycle: Secondary | ICD-10-CM

## 2023-12-20 ENCOUNTER — Ambulatory Visit: Payer: 59 | Admitting: Dermatology

## 2023-12-20 ENCOUNTER — Encounter: Payer: Self-pay | Admitting: Adult Health

## 2023-12-21 ENCOUNTER — Ambulatory Visit: Payer: 59 | Admitting: Pharmacist

## 2023-12-21 NOTE — Telephone Encounter (Signed)
 Summary of Benefits received from [employer],   Lupron Depot 11.25 mg is NOT covered under patients pharmacy benefits.   Routing to Dr. Karma Greaser to review and advise.

## 2023-12-25 DIAGNOSIS — I639 Cerebral infarction, unspecified: Secondary | ICD-10-CM

## 2023-12-25 HISTORY — DX: Cerebral infarction, unspecified: I63.9

## 2023-12-26 ENCOUNTER — Other Ambulatory Visit: Payer: Self-pay | Admitting: Family Medicine

## 2023-12-27 NOTE — Telephone Encounter (Signed)
 Too soon for refill, last refill 11/25/23.  Requested Prescriptions  Pending Prescriptions Disp Refills   metFORMIN (GLUCOPHAGE) 500 MG tablet [Pharmacy Med Name: metFORMIN HCl 500 MG Oral Tablet] 200 tablet 2    Sig: TAKE 1 TABLET BY MOUTH TWICE  DAILY WITH MEALS     Endocrinology:  Diabetes - Biguanides Passed - 12/27/2023  4:16 PM      Passed - Cr in normal range and within 360 days    Creat  Date Value Ref Range Status  11/22/2023 0.70 0.50 - 0.99 mg/dL Final   Creatinine, Urine  Date Value Ref Range Status  11/22/2023 90 20 - 275 mg/dL Final         Passed - HBA1C is between 0 and 7.9 and within 180 days    HbA1c, POC (controlled diabetic range)  Date Value Ref Range Status  02/04/2023 6.0 0.0 - 7.0 % Final   Hgb A1c MFr Bld  Date Value Ref Range Status  11/22/2023 7.7 (H) <5.7 % of total Hgb Final    Comment:    For someone without known diabetes, a hemoglobin A1c value of 6.5% or greater indicates that they may have  diabetes and this should be confirmed with a follow-up  test. . For someone with known diabetes, a value <7% indicates  that their diabetes is well controlled and a value  greater than or equal to 7% indicates suboptimal  control. A1c targets should be individualized based on  duration of diabetes, age, comorbid conditions, and  other considerations. . Currently, no consensus exists regarding use of hemoglobin A1c for diagnosis of diabetes for children. .          Passed - eGFR in normal range and within 360 days    GFR, Est African American  Date Value Ref Range Status  10/28/2016 >89 >=60 mL/min Final   GFR calc Af Amer  Date Value Ref Range Status  12/12/2020 127 >59 mL/min/1.73 Final    Comment:    **In accordance with recommendations from the NKF-ASN Task force,**   [facility] is in the process of updating its eGFR calculation to the   2021 CKD-EPI creatinine equation that estimates kidney function   without a race variable.    GFR, Est  Non African American  Date Value Ref Range Status  10/28/2016 >89 >=60 mL/min Final   GFR, Estimated  Date Value Ref Range Status  08/30/2023 >60 >60 mL/min Final    Comment:    (NOTE) Calculated using the CKD-EPI Creatinine Equation (2021)    GFR  Date Value Ref Range Status  02/20/2022 106.09 >60.00 mL/min Final    Comment:    Calculated using the CKD-EPI Creatinine Equation (2021)   eGFR  Date Value Ref Range Status  09/16/2023 78 >59 mL/min/1.73 Final         Passed - B12 Level in normal range and within 720 days    Vitamin B-12  Date Value Ref Range Status  08/30/2023 585 180 - 914 pg/mL Final    Comment:    (NOTE) This assay is not validated for testing neonatal or myeloproliferative syndrome specimens for Vitamin B12 levels. Performed at [facility], [address], [place], [place] [address]          Passed - Valid encounter within last 6 months    Recent Outpatient Visits           1 month ago Type 2 diabetes mellitus with other neurologic complication, without  long-term current use of insulin (HCC)   [facility] [place] Hoy Register, MD   3 months ago Urinary symptom or sign   [facility] [place] [place] Hoy Register, MD   4 months ago Statin myopathy   [facility] [place] [facility] [place]. [facility] Hoy Register, MD   10 months ago Type 2 diabetes mellitus with other neurologic complication, without long-term current use of insulin (HCC)   [facility] [place]. [facility] Lois Huxley, [place] L, RPH-CPP   10 months ago Type 2 diabetes mellitus with other neurologic complication, without long-term current use of insulin (HCC)   [facility] [place]. [facility] Hoy Register, MD       Future Appointments              In 2 days Nehemiah Settle, FNP [facility] [place]   In 1 week Hoy Register, MD [facility] [place] [facility]. [facility]            Passed - CBC within normal limits and completed in the last 12 months    WBC  Date Value Ref Range Status  11/18/2023 8.5 3.8 - 10.8 Thousand/uL Final   RBC  Date Value Ref Range Status  11/18/2023 4.57 3.80 - 5.10 Million/uL Final   Hemoglobin  Date Value Ref Range Status  11/18/2023 14.7 11.7 - 15.5 g/dL Final  40/98/1191 47.8 12.0 - 15.0 g/dL Final  29/56/2130 86.5 (L) 11.1 - 15.9 g/dL Final   HCT  Date Value Ref Range Status  11/18/2023 44.0 35.0 - 45.0 % Final   Hematocrit  Date Value Ref Range Status  02/05/2023 33.9 (L) 34.0 - 46.6 % Final   MCHC  Date Value Ref Range Status  11/18/2023 33.4 32.0 - 36.0 g/dL Final    Comment:    For adults, a slight decrease in the calculated MCHC value (in the range of 30 to 32 g/dL) is most likely not clinically significant; however, it should be interpreted with caution in correlation with other red cell parameters and the patient's clinical condition.    [facility]  Date Value Ref Range Status  11/18/2023 32.2 27.0 - 33.0 pg Final   MCV  Date Value Ref Range Status  11/18/2023 96.3 80.0 - 100.0 fL Final  02/05/2023 89 79 - 97 fL Final   No results found for: "PLTCOUNTKUC", "LABPLAT", "POCPLA" RDW  Date Value Ref Range Status  11/18/2023 12.7 11.0 - 15.0 % Final  02/05/2023 14.8 11.7 - 15.4 % Final

## 2023-12-29 ENCOUNTER — Ambulatory Visit: Payer: 59 | Admitting: Family

## 2023-12-30 ENCOUNTER — Encounter: Payer: Self-pay | Admitting: Internal Medicine

## 2023-12-30 ENCOUNTER — Ambulatory Visit: Payer: 59 | Attending: Internal Medicine | Admitting: Internal Medicine

## 2023-12-30 ENCOUNTER — Encounter: Payer: Self-pay | Admitting: Obstetrics and Gynecology

## 2023-12-30 VITALS — BP 156/98 | HR 86 | Ht 64.0 in | Wt 168.0 lb

## 2023-12-30 DIAGNOSIS — I700 Atherosclerosis of aorta: Secondary | ICD-10-CM | POA: Diagnosis not present

## 2023-12-30 DIAGNOSIS — I25118 Atherosclerotic heart disease of native coronary artery with other forms of angina pectoris: Secondary | ICD-10-CM | POA: Diagnosis not present

## 2023-12-30 DIAGNOSIS — T466X5D Adverse effect of antihyperlipidemic and antiarteriosclerotic drugs, subsequent encounter: Secondary | ICD-10-CM | POA: Diagnosis not present

## 2023-12-30 DIAGNOSIS — G720 Drug-induced myopathy: Secondary | ICD-10-CM | POA: Diagnosis not present

## 2023-12-30 DIAGNOSIS — I10 Essential (primary) hypertension: Secondary | ICD-10-CM

## 2023-12-30 DIAGNOSIS — Z789 Other specified health status: Secondary | ICD-10-CM | POA: Diagnosis not present

## 2023-12-30 DIAGNOSIS — E785 Hyperlipidemia, unspecified: Secondary | ICD-10-CM

## 2023-12-30 NOTE — Telephone Encounter (Signed)
 Response received from [facility] regarding Lupron-scanned into chart.   Spoke with patient. Patient states she received a call regarding Lupron, has not called back. Patient is uncertain if she wants to proceed with Lupron. Patient Is going to contact [facility] to determine OOP cost and return call to office to advise if she wants to continue with this option. Patient is aware to call if she has any questions. Wants to know what alternative will be?   Routing to Dr. Karma Greaser

## 2023-12-30 NOTE — Progress Notes (Signed)
 LIPID CLINIC CONSULT NOTE  Chief Complaint:  Manage dyslipidemia  Primary Care Physician: Hoy Register, MD  Primary Cardiologist:  Chrystie Nose, MD  HPI:  Amber Rose is a 48 y.o. female who is being seen today for the evaluation of dyslipidemia at the request of Alver Sorrow, NP.  This a pleasant 49 year old female kindly referred by Gillian Shields, NP for evaluation management of dyslipidemia.  She has a history of type 2 diabetes with recent A1c 7.7%.  She had a prior coronary CT angiogram which showed mild nonobstructive coronary disease with a calcium score 124, 99th percentile for age and sex matched controls.  She has previously tried statin therapy but had significant myalgia and/or myopathy with what she reports is longstanding damage to her legs.  Based on this she is quite frustrated about lipid-lowering therapy and generally noted at the beginning of the visit that she was not likely to take any medications for cholesterol lowering.  She had previously also been tried on Repatha but after her initial shot had side effects as well.  Her recent lipid showed total cholesterol 246, HDL 31, triglycerides 159 and LDL 184.  This is highly suggestive of a familial hyperlipidemia.  PMHx:  Past Medical History:  Diagnosis Date   Abnormal Pap smear of cervix    yrs ago   Adenomyosis    Allergy    Anemia    Anxiety    Aortic atherosclerosis (HCC)    Arthritis    Asthma    Chronic UTI    Clostridium difficile infection    Depression    Diabetes mellitus without complication (HCC) 10/22/2023   A1C 8.8   Diverticulosis    Endometriosis    Fibroid    Fibromyalgia    GERD (gastroesophageal reflux disease)    Hiatal hernia    Hyperlipidemia    Hypertension    Internal hemorrhoids    Meningitis, viral    Migraines    Pancreatitis    Pure hypercholesterolemia 12/23/2020   Tubular adenoma of colon     Past Surgical History:  Procedure Laterality Date    APPENDECTOMY  1990   CERVICAL BIOPSY  W/ LOOP ELECTRODE EXCISION     LEEP   CESAREAN SECTION  2002   CHOLECYSTECTOMY  2011   COLONOSCOPY     UPPER GASTROINTESTINAL ENDOSCOPY     URINARY SURGERY     urethra sling, removal, and then revision 2016 (4 surgeries)   WISDOM TOOTH EXTRACTION      FAMHx:  Family History  Problem Relation Age of Onset   Cancer Mother        cervical, breast, lung, skin, bladder-ex smoker   Hypertension Mother    Diabetes Father    Heart attack Maternal Grandfather    Multiple sclerosis Paternal Grandmother     SOCHx:   reports that she has been smoking cigarettes. She has a 2 pack-year smoking history. She has never used smokeless tobacco. She reports current alcohol use. She reports current drug use. Drug: Marijuana.  ALLERGIES:  Allergies  Allergen Reactions   Amlodipine     Leg pain, swelling around eyes    Atorvastatin Other (See Comments)     Joint pain   Crestor [Rosuvastatin]     Joint pain    Irbesartan     Leg pain, swelling around eyes   Metformin And Related Nausea And Vomiting   Metronidazole Nausea And Vomiting   Penicillins     childhood  Xanax [Alprazolam]     "Caused upper respiratory symptoms" per pt   Glyburide Other (See Comments)    "blood sugar dropped uncontrollably"   Linagliptin Diarrhea and Other (See Comments)    Stomach pain, sinus infection   Moxifloxacin Diarrhea    ROS: Pertinent items noted in HPI and remainder of comprehensive ROS otherwise negative.  HOME MEDS: Current Outpatient Medications on File Prior to Visit  Medication Sig Dispense Refill   acyclovir (ZOVIRAX) 400 MG tablet TAKE 1 TABLET BY MOUTH TWICE  DAILY 180 tablet 1   albuterol (PROAIR HFA) 108 (90 Base) MCG/ACT inhaler Inhale 2 puffs into the lungs every 4 (four) hours as needed for wheezing or shortness of breath. 3 Inhaler 0   Blood Glucose Monitoring Suppl (ACCU-CHEK AVIVA PLUS) w/Device KIT USE AS DIRECTED DAILY. E11.9 1 kit 0    buPROPion (WELLBUTRIN XL) 150 MG 24 hr tablet TAKE 1 TABLET BY MOUTH EVERY DAY 90 tablet 1   Continuous Blood Gluc Receiver (FREESTYLE LIBRE 2 READER) DEVI Use to check blood sugar three times daily. E11.49 1 each 0   Continuous Glucose Sensor (FREESTYLE LIBRE 2 SENSOR) MISC USE TO CHECK BLOOD SUGAR 3 TIMES A DAY - CHANGE SENSOR EVERY 14 DAYS 2 each 41   glucose blood (FREESTYLE TEST STRIPS) test strip Use to check blood sugar three times daily. E11.49 100 each 3   hydrOXYzine (ATARAX) 50 MG tablet Take 1 tablet (50 mg total) by mouth 3 (three) times daily as needed. 30 tablet 5   ibuprofen (ADVIL) 800 MG tablet TAKE 1 TABLET BY MOUTH EVERY 12  HOURS AS NEEDED 90 tablet 1   insulin glargine (LANTUS SOLOSTAR) 100 UNIT/ML Solostar Pen Inject 25 Units into the skin daily. Increase by 2 units to a maximum daily dose of 30 units every 4th day until blood sugars are at goal 15 mL 6   Insulin Pen Needle 31G X 5 MM MISC 1 each by Does not apply route at bedtime. 30 each 5   Lactic Ac-Citric Ac-Pot Bitart (PHEXXI) 1.8-1-0.4 % GEL Place 1 Applicatorful vaginally as directed. Place in vagina one hour prior to intercourse 5 g 12   Lancet Devices (ACCU-CHEK SOFTCLIX) lancets Use as instructed daily. 1 each 5   Lancets (FREESTYLE) lancets Use to check blood sugar three times daily. E11.49 100 each 3   metFORMIN (GLUCOPHAGE) 500 MG tablet Take 2 tablets (1,000 mg total) by mouth 2 (two) times daily with a meal. 120 tablet 4   methocarbamol (ROBAXIN) 750 MG tablet Take 1 tablet (750 mg total) by mouth every 8 (eight) hours as needed for muscle spasms. 90 tablet 3   norethindrone (AYGESTIN) 5 MG tablet TAKE 1 TABLET BY MOUTH THREE TIMES A DAY 252 tablet 2   omeprazole (PRILOSEC) 40 MG capsule TAKE 1 CAPSULE BY MOUTH EVERY  MORNING 100 capsule 0   Prasterone (INTRAROSA) 6.5 MG INST Place 1 suppository vaginally at bedtime as needed. 30 each 12   pregabalin (LYRICA) 100 MG capsule TAKE 1 CAPSULE BY MOUTH TWICE  DAILY  60 capsule 5   saccharomyces boulardii (FLORASTOR) 250 MG capsule Take 1 capsule (250 mg total) by mouth 2 (two) times daily. 60 capsule 0   Semaglutide,0.25 or 0.5MG /DOS, 2 MG/3ML SOPN Inject 0.25 mg into the skin once a week. 3 mL 1   spironolactone (ALDACTONE) 50 MG tablet Take 1 tablet (50 mg total) by mouth daily. 90 tablet 3   LUPRON DEPOT, 59-MONTH, 11.25 MG injection Inject  11.25 mg into the muscle every 3 (three) months. (Patient not taking: Reported on 12/30/2023)     No current facility-administered medications on file prior to visit.    LABS/IMAGING: No results found for this or any previous visit (from the past 48 hours). No results found.  LIPID PANEL:    Component Value Date/Time   CHOL 246 (H) 11/22/2023 0950   CHOL 213 (H) 12/01/2022 0955   TRIG 159 (H) 11/22/2023 0950   HDL 31 (L) 11/22/2023 0950   HDL 43 12/01/2022 0955   CHOLHDL 7.9 (H) 11/22/2023 0950   LDLCALC 184 (H) 11/22/2023 0950   LDLDIRECT 51 08/14/2022 1431    WEIGHTS: Wt Readings from Last 3 Encounters:  12/30/23 168 lb (76.2 kg)  11/29/23 171 lb 8 oz (77.8 kg)  11/25/23 169 lb 9.6 oz (76.9 kg)    VITALS: BP (!) 156/98 (BP Location: Left Arm, Patient Position: Sitting, Cuff Size: Normal)   Pulse 86   Ht 5\' 4"  (1.626 m)   Wt 168 lb (76.2 kg)   SpO2 93%   BMI 28.84 kg/m   EXAM: Deferred  EKG: Deferred  ASSESSMENT: History of mixed dyslipidemia with high LDL cholesterol concerning for possible familial hyperlipidemia Statin and Repatha intolerance, myalgias and myopathy Coronary artery disease with a calcium score of 124, 99th percentile, mild nonobstructive disease by coronary CT (01/2023) Type 2 diabetes-A1c 7.7%  PLAN: 1.   Ms. Rogalski has previously been on statins and Repatha reported myopathy and what she feels is permanent damage related to the statins.  She is quite hesitant to try any other lipid-lowering therapies.  We did discuss other possible options today including Leqvio,  Nexletol and ezetimibe which she has not tried.  Amongst those, Wilber Bihari is probably the best tolerated with the most cardiovascular risk reduction and lipid lowering.  This is an every 15-month shot and it would not be expected to have side effects lasting that long, however based on mechanism of action.  She wishes to consider options further and reach out to [place] if she is interested in any additional therapy.  I feel that she is at high risk of progression of her coronary disease with likely 20% chance of cardiovascular event in the next 5 years.  Follow-up as needed.  Chrystie Nose, MD, [employer] [facility], FACP  [place]  [facility]  Medical Director of the [facility]  Cardiovascular Risk Reduction Clinic Diplomate of the American Board of Clinical Lipidology Attending Cardiologist  Direct Dial: [phone]  Fax: [phone]  Website:  [email or web address] 12/30/2023, 9:59 AM

## 2023-12-30 NOTE — Telephone Encounter (Signed)
 Dr. Karma Greaser -please review and advise

## 2023-12-30 NOTE — Patient Instructions (Addendum)
 Medication Instructions:  No Changes *If you need a refill on your cardiac medications before your next appointment, please call your pharmacy*   Lab Work: None If you have labs (blood work) drawn today and your tests are completely normal, you will receive your results only by: MyChart Message (if you have MyChart) OR A paper copy in the mail If you have any lab test that is abnormal or we need to change your treatment, we will call you to review the results.   Testing/Procedures: None   Follow-Up: At [facility], you and your health needs are our priority.  As part of our continuing mission to provide you with exceptional heart care, we have created designated Provider Care Teams.  These Care Teams include your primary Cardiologist (physician) and Advanced Practice Providers (APPs -  Physician Assistants and Nurse Practitioners) who all work together to provide you with the care you need, when you need it.  Your next appointment:   PRN/as needed; please call us or send a MyChart Message to be seen/or with any concerns/questions.  Provider:   Chrystie Nose, MD     Other Instructions You can Research/Read about the following medications.  If you would like to talk more/have any prescribed, please reach out to [place] by phone call or MyChart Message.  -Leqvio   -Nexletol  -Ezetimibe

## 2024-01-06 ENCOUNTER — Encounter: Payer: Self-pay | Admitting: Family Medicine

## 2024-01-06 ENCOUNTER — Ambulatory Visit: Payer: 59 | Attending: Family Medicine | Admitting: Family Medicine

## 2024-01-06 ENCOUNTER — Other Ambulatory Visit (HOSPITAL_COMMUNITY)
Admission: RE | Admit: 2024-01-06 | Discharge: 2024-01-06 | Disposition: A | Discharge status: Home / Self Care | Source: Ambulatory Visit | Attending: Family Medicine | Admitting: Family Medicine

## 2024-01-06 VITALS — BP 127/84 | HR 74 | Ht 64.0 in | Wt 165.6 lb

## 2024-01-06 DIAGNOSIS — I10 Essential (primary) hypertension: Secondary | ICD-10-CM | POA: Diagnosis not present

## 2024-01-06 DIAGNOSIS — M797 Fibromyalgia: Secondary | ICD-10-CM

## 2024-01-06 DIAGNOSIS — F322 Major depressive disorder, single episode, severe without psychotic features: Secondary | ICD-10-CM | POA: Insufficient documentation

## 2024-01-06 DIAGNOSIS — R399 Unspecified symptoms and signs involving the genitourinary system: Secondary | ICD-10-CM

## 2024-01-06 DIAGNOSIS — Z7985 Long-term (current) use of injectable non-insulin antidiabetic drugs: Secondary | ICD-10-CM

## 2024-01-06 DIAGNOSIS — R202 Paresthesia of skin: Secondary | ICD-10-CM

## 2024-01-06 DIAGNOSIS — E1159 Type 2 diabetes mellitus with other circulatory complications: Secondary | ICD-10-CM

## 2024-01-06 DIAGNOSIS — R5383 Other fatigue: Secondary | ICD-10-CM | POA: Diagnosis not present

## 2024-01-06 DIAGNOSIS — N3281 Overactive bladder: Secondary | ICD-10-CM

## 2024-01-06 DIAGNOSIS — F32A Depression, unspecified: Secondary | ICD-10-CM

## 2024-01-06 DIAGNOSIS — E119 Type 2 diabetes mellitus without complications: Secondary | ICD-10-CM

## 2024-01-06 DIAGNOSIS — Z8619 Personal history of other infectious and parasitic diseases: Secondary | ICD-10-CM | POA: Diagnosis not present

## 2024-01-06 DIAGNOSIS — M79651 Pain in right thigh: Secondary | ICD-10-CM

## 2024-01-06 DIAGNOSIS — G720 Drug-induced myopathy: Secondary | ICD-10-CM

## 2024-01-06 DIAGNOSIS — F419 Anxiety disorder, unspecified: Secondary | ICD-10-CM

## 2024-01-06 DIAGNOSIS — E1149 Type 2 diabetes mellitus with other diabetic neurological complication: Secondary | ICD-10-CM

## 2024-01-06 LAB — POCT URINALYSIS DIP (CLINITEK)
Bilirubin, UA: NEGATIVE
Glucose, UA: NEGATIVE mg/dL
Leukocytes, UA: NEGATIVE
Nitrite, UA: NEGATIVE
POC PROTEIN,UA: NEGATIVE
Spec Grav, UA: 1.02 (ref 1.010–1.025)
Urobilinogen, UA: 0.2 U/dL
pH, UA: 6 (ref 5.0–8.0)

## 2024-01-06 MED ORDER — VALACYCLOVIR HCL 500 MG PO TABS
500.0000 mg | ORAL_TABLET | Freq: Two times a day (BID) | ORAL | 1 refills | Status: DC
Start: 2024-01-06 — End: 2024-04-04

## 2024-01-06 MED ORDER — BUPROPION HCL ER (XL) 150 MG PO TB24: 150.0000 mg | ORAL_TABLET | Freq: Every day | ORAL | 1 refills | Status: DC

## 2024-01-06 MED ORDER — PREGABALIN 100 MG PO CAPS: 100.0000 mg | ORAL_CAPSULE | Freq: Two times a day (BID) | ORAL | 5 refills | Status: DC

## 2024-01-06 NOTE — Progress Notes (Signed)
 Subjective:  Patient ID: Amber Rose, female    DOB: August 19, 1976  Age: 48 y.o. MRN: [id number]  CC: Medical Management of Chronic Issues (Dark/cloudy urine/Discuss diabetes)     Discussed the use of AI scribe software for clinical note transcription with the patient, who gave verbal consent to proceed.  History of Present Illness The patient, with a history of type 2 diabetes mellitus , hypertension, depression and anxiety, hydradenitis, tobacco abuse, fibromyalgia   presents with multiple complaints. The primary concern is urinary symptoms, including foamy, orange-colored urine with an unusual odor. The patient also reports increased urinary incontinence, particularly during coughing or sneezing. These symptoms have been ongoing and intermittent since the first time she reported them, and the patient does not believe she has fully resolved.  The patient also reports significant fatigue and hot flashes, which she believes may be related to her diabetes as they seem to occur when her blood sugar levels fluctuate. The hot flashes are severe, causing the patient to sweat profusely and feel uncomfortably hot. This is disrupting her sleep and occurring during work hours, causing significant distress.  The patient also mentions issues with her menstrual cycle. She is currently taking Aygestin to manage heavy periods, but is experiencing daily spotting. She is currently under the care of GYN but feels that she is being pressured into going in for hysterectomy but states her body does not heal well after surgeries.  The patient also reports chronic pain and has been prescribed Lyrica. She expresses dissatisfaction with her current treatment plan, feeling that her symptoms are not being adequately managed.  She feels depressed and is on Wellbutrin and also seeing a therapist.  Sometimes has suicidal ideations but no intentions or plan. For herpes prophylaxis she feels she has been on acyclovir for a  while and would like to change to something else.  Her diabetes is managed by endocrine and she is noticing some improvement in her blood sugars with A1c of 7.7.  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  She was seen by the cardiology lipid clinic and other options provided to her but she wanted to research this first.    Past Medical History:  Diagnosis Date   Abnormal Pap smear of cervix    yrs ago   Adenomyosis    Allergy    Anemia    Anxiety    Aortic atherosclerosis (HCC)    Arthritis    Asthma    Chronic UTI    Clostridium difficile infection    Depression    Diabetes mellitus without complication (HCC) 10/22/2023   A1C 8.8   Diverticulosis    Endometriosis    Fibroid    Fibromyalgia    GERD (gastroesophageal reflux disease)    Hiatal hernia    Hyperlipidemia    Hypertension    Internal hemorrhoids    Meningitis, viral    Migraines    Pancreatitis    Pure hypercholesterolemia 12/23/2020   Tubular adenoma of colon     Past Surgical History:  Procedure Laterality Date   APPENDECTOMY  1990   CERVICAL BIOPSY  W/ LOOP ELECTRODE EXCISION     LEEP   CESAREAN SECTION  2002   CHOLECYSTECTOMY  2011   COLONOSCOPY     UPPER GASTROINTESTINAL ENDOSCOPY     URINARY SURGERY     urethra sling, removal, and then revision 2016 (4 surgeries)   WISDOM TOOTH EXTRACTION  Family History  Problem Relation Age of Onset   Cancer Mother        cervical, breast, lung, skin, bladder-ex smoker   Hypertension Mother    Diabetes Father    Heart attack Maternal Grandfather    Multiple sclerosis Paternal Grandmother     Social History   Socioeconomic History   Marital status: Single    Spouse name: Not on file   Number of children: 3   Years of education: Not on file   Highest education level: Not on file  Occupational History   Not on file  Tobacco Use   Smoking status: Every Day    Current packs/day: 0.10    Average  packs/day: 0.1 packs/day for 20.0 years (2.0 ttl pk-yrs)    Types: Cigarettes   Smokeless tobacco: Never   Tobacco comments:    Patient is engaged in health coaching for smoking cessation as of 12/26/20  Vaping Use   Vaping status: Never Used  Substance and Sexual Activity   Alcohol use: Yes    Comment: occ   Drug use: Yes    Types: Marijuana    Comment: occ   Sexual activity: Yes    Partners: Male    Birth control/protection: Surgical    Comment: BTL  Other Topics Concern   Not on file  Social History Narrative   Lives home with adult niece.  Not working.  Disability pending.  Pt is single.  Education GED.  3 children.    Social Drivers of Health   Financial Resource Strain: Medium Risk (09/20/2023)   Overall Financial Resource Strain (CARDIA)    Difficulty of Paying Living Expenses: Somewhat hard  Food Insecurity: Food Insecurity Present (09/20/2023)   Hunger Vital Sign    Worried About Running Out of Food in the Last Year: Sometimes true    Ran Out of Food in the Last Year: Sometimes true  Transportation Needs: No Transportation Needs (09/20/2023)   PRAPARE - Administrator, Civil Service (Medical): No    Lack of Transportation (Non-Medical): No  Physical Activity: Insufficiently Active (09/20/2023)   Exercise Vital Sign    Days of Exercise per Week: 3 days    Minutes of Exercise per Session: 30 min  Stress: Stress Concern Present (09/20/2023)   [employer] of Occupational Health - Occupational Stress Questionnaire    Feeling of Stress : To some extent  Social Connections: Socially Isolated (09/20/2023)   Social Connection and Isolation Panel [NHANES]    Frequency of Communication with Friends and Family: Three times a week    Frequency of Social Gatherings with Friends and Family: Once a week    Attends Religious Services: Never    Database administrator or Organizations: No    Attends Engineer, structural: Never    Marital Status: Never  married    Allergies  Allergen Reactions   Amlodipine     Leg pain, swelling around eyes    Atorvastatin Other (See Comments)     Joint pain   Crestor [Rosuvastatin]     Joint pain    Irbesartan     Leg pain, swelling around eyes   Metformin And Related Nausea And Vomiting   Metronidazole Nausea And Vomiting   Penicillins     childhood   Xanax [Alprazolam]     "Caused upper respiratory symptoms" per pt   Glyburide Other (See Comments)    "blood sugar dropped uncontrollably"   Linagliptin Diarrhea and Other (See  Comments)    Stomach pain, sinus infection   Moxifloxacin Diarrhea    Outpatient Medications Prior to Visit  Medication Sig Dispense Refill   albuterol (PROAIR HFA) 108 (90 Base) MCG/ACT inhaler Inhale 2 puffs into the lungs every 4 (four) hours as needed for wheezing or shortness of breath. 3 Inhaler 0   Blood Glucose Monitoring Suppl (ACCU-CHEK AVIVA PLUS) w/Device KIT USE AS DIRECTED DAILY. E11.9 1 kit 0   Continuous Blood Gluc Receiver (FREESTYLE LIBRE 2 READER) DEVI Use to check blood sugar three times daily. E11.49 1 each 0   Continuous Glucose Sensor (FREESTYLE LIBRE 2 SENSOR) MISC USE TO CHECK BLOOD SUGAR 3 TIMES A DAY - CHANGE SENSOR EVERY 14 DAYS 2 each 41   glucose blood (FREESTYLE TEST STRIPS) test strip Use to check blood sugar three times daily. E11.49 100 each 3   hydrOXYzine (ATARAX) 50 MG tablet Take 1 tablet (50 mg total) by mouth 3 (three) times daily as needed. 30 tablet 5   ibuprofen (ADVIL) 800 MG tablet TAKE 1 TABLET BY MOUTH EVERY 12  HOURS AS NEEDED 90 tablet 1   insulin glargine (LANTUS SOLOSTAR) 100 UNIT/ML Solostar Pen Inject 25 Units into the skin daily. Increase by 2 units to a maximum daily dose of 30 units every 4th day until blood sugars are at goal 15 mL 6   Insulin Pen Needle 31G X 5 MM MISC 1 each by Does not apply route at bedtime. 30 each 5   Lactic Ac-Citric Ac-Pot Bitart (PHEXXI) 1.8-1-0.4 % GEL Place 1 Applicatorful vaginally as  directed. Place in vagina one hour prior to intercourse 5 g 12   Lancet Devices (ACCU-CHEK SOFTCLIX) lancets Use as instructed daily. 1 each 5   Lancets (FREESTYLE) lancets Use to check blood sugar three times daily. E11.49 100 each 3   LUPRON DEPOT, 49-MONTH, 11.25 MG injection Inject 11.25 mg into the muscle every 3 (three) months.     metFORMIN (GLUCOPHAGE) 500 MG tablet Take 2 tablets (1,000 mg total) by mouth 2 (two) times daily with a meal. 120 tablet 4   methocarbamol (ROBAXIN) 750 MG tablet Take 1 tablet (750 mg total) by mouth every 8 (eight) hours as needed for muscle spasms. 90 tablet 3   norethindrone (AYGESTIN) 5 MG tablet TAKE 1 TABLET BY MOUTH THREE TIMES A DAY 252 tablet 2   omeprazole (PRILOSEC) 40 MG capsule TAKE 1 CAPSULE BY MOUTH EVERY  MORNING 100 capsule 0   Prasterone (INTRAROSA) 6.5 MG INST Place 1 suppository vaginally at bedtime as needed. 30 each 12   saccharomyces boulardii (FLORASTOR) 250 MG capsule Take 1 capsule (250 mg total) by mouth 2 (two) times daily. 60 capsule 0   Semaglutide,0.25 or 0.5MG /DOS, 2 MG/3ML SOPN Inject 0.25 mg into the skin once a week. 3 mL 1   spironolactone (ALDACTONE) 50 MG tablet Take 1 tablet (50 mg total) by mouth daily. 90 tablet 3   acyclovir (ZOVIRAX) 400 MG tablet TAKE 1 TABLET BY MOUTH TWICE  DAILY 180 tablet 1   buPROPion (WELLBUTRIN XL) 150 MG 24 hr tablet TAKE 1 TABLET BY MOUTH EVERY DAY 90 tablet 1   pregabalin (LYRICA) 100 MG capsule TAKE 1 CAPSULE BY MOUTH TWICE  DAILY 60 capsule 5   No facility-administered medications prior to visit.     ROS Review of Systems  Constitutional:  Negative for activity change and appetite change.  HENT:  Negative for sinus pressure and sore throat.   Respiratory:  Negative  for chest tightness, shortness of breath and wheezing.   Cardiovascular:  Negative for chest pain and palpitations.  Gastrointestinal:  Negative for abdominal distention, abdominal pain and constipation.  Genitourinary:  Negative.   Musculoskeletal: Negative.   Psychiatric/Behavioral:  Negative for behavioral problems and dysphoric mood.     Objective:  BP 127/84   Pulse 74   Ht 5\' 4"  (1.626 m)   Wt 165 lb 9.6 oz (75.1 kg)   SpO2 98%   BMI 28.43 kg/m      01/06/2024    9:58 AM 12/30/2023    9:26 AM 11/29/2023   11:55 AM  BP/Weight  Systolic BP 127 156   Diastolic BP 84 98   Wt. (Lbs) 165.6 168 171.5  BMI 28.43 kg/m2 28.84 kg/m2 29.9 kg/m2      Physical Exam Constitutional:      Appearance: She is well-developed.  Cardiovascular:     Rate and Rhythm: Normal rate.     Heart sounds: Normal heart sounds. No murmur heard. Pulmonary:     Effort: Pulmonary effort is normal.     Breath sounds: Normal breath sounds. No wheezing or rales.  Chest:     Chest wall: No tenderness.  Abdominal:     General: Bowel sounds are normal. There is no distension.     Palpations: Abdomen is soft. There is no mass.     Tenderness: There is no abdominal tenderness.  Musculoskeletal:        General: Normal range of motion.     Right lower leg: No edema.     Left lower leg: No edema.  Neurological:     Mental Status: She is alert and oriented to person, place, and time.  Psychiatric:        Mood and Affect: Mood normal.        Latest Ref Rng & Units 11/22/2023    9:50 AM 09/16/2023   10:35 AM 08/30/2023   10:52 AM  CMP  Glucose 65 - 99 mg/dL 161  096  045   BUN 7 - 25 mg/dL 16  19  21    Creatinine 0.50 - 0.99 mg/dL 4.09  8.11  9.14   Sodium 135 - 146 mmol/L 136  139  134   Potassium 3.5 - 5.3 mmol/L 4.4  4.5  4.8   Chloride 98 - 110 mmol/L 105  103  101   CO2 20 - 32 mmol/L 21  21  28    Calcium 8.6 - 10.2 mg/dL 9.1  9.7  9.8   Total Protein 6.1 - 8.1 g/dL 7.2   7.5   Total Bilirubin 0.2 - 1.2 mg/dL 0.2   0.6   Alkaline Phos 38 - 126 U/L   59   AST 10 - 35 U/L 18   13   ALT 6 - 29 U/L 22   14     Lipid Panel     Component Value Date/Time   CHOL 246 (H) 11/22/2023 0950   CHOL 213 (H)  12/01/2022 0955   TRIG 159 (H) 11/22/2023 0950   HDL 31 (L) 11/22/2023 0950   HDL 43 12/01/2022 0955   CHOLHDL 7.9 (H) 11/22/2023 0950   LDLCALC 184 (H) 11/22/2023 0950   LDLDIRECT 51 08/14/2022 1431    CBC    Component Value Date/Time   WBC 8.5 11/18/2023 1006   RBC 4.57 11/18/2023 1006   HGB 14.7 11/18/2023 1006   HGB 13.9 06/02/2023 1117   HGB 10.8 (L) 02/05/2023 1147  HCT 44.0 11/18/2023 1006   HCT 33.9 (L) 02/05/2023 1147   PLT 288 11/18/2023 1006   PLT 279 06/02/2023 1117   PLT 312 02/05/2023 1147   MCV 96.3 11/18/2023 1006   MCV 89 02/05/2023 1147   MCH 32.2 11/18/2023 1006   MCHC 33.4 11/18/2023 1006   RDW 12.7 11/18/2023 1006   RDW 14.8 02/05/2023 1147   LYMPHSABS 2.6 08/30/2023 1052   LYMPHSABS 2.0 02/05/2023 1147   MONOABS 0.6 08/30/2023 1052   EOSABS 0.1 08/30/2023 1052   EOSABS 0.0 02/05/2023 1147   BASOSABS 0.0 08/30/2023 1052   BASOSABS 0.0 02/05/2023 1147    Lab Results  Component Value Date   HGBA1C 7.7 (H) 11/22/2023    Lab Results  Component Value Date   TSH 0.75 10/13/2023    The 10-year ASCVD risk score (Arnett DK, et al., 2019) is: 34.3%   Values used to calculate the score:     Age: 27 years     Sex: Female     Is Non-Hispanic African American: Yes     Diabetic: Yes     Tobacco smoker: Yes     Systolic Blood Pressure: 127 mmHg     Is BP treated: Yes     HDL Cholesterol: 31 mg/dL     Total Cholesterol: 246 mg/dL       Assessment & Plan Urinary symptoms Persistent urinary symptoms possibly related to menopause and overactive bladder. Discussed VESIcare as a treatment option. - Order urinalysis and vaginal swab to check for infection or other abnormalities. - Discuss VESIcare as a treatment option for overactive bladder and urinary incontinence; she would like to research and consider. -UA negative for UTI.  Pre Menopausal symptoms Experiencing menopausal symptoms affecting quality of life. Hormone replacement therapy  discussed but not initiated. -Patient she states her GYN states labs show she is not yet in menopause however she is in the age range that is close to menopause. - Discuss potential for hormone replacement therapy with gynecologist.  Chronic fatigue and fibromyalgia Chronic fatigue possibly related to fibromyalgia. Normal hemoglobin and thyroid levels. - Refer to water therapy program for chronic pain and fatigue management. -Currently on Lyrica  Depression and anxiety Reports fatigue and emotional distress. Encouraged open communication with counselor. - Encourage her to discuss feelings and thoughts with her counselor. -Currently on Wellbutrin -Did not do well another antidepressant  Type 2 diabetes mellitus -Improving with A1c of 7.7 Managing diabetes with medication and lifestyle changes. Challenges with blood sugar control noted. - Continue current diabetes management plan with endocrinologist. - Monitor blood glucose levels regularly.  Herpes simplex virus prophylaxis Currently on acyclovir with potential side effects. Discussed switching to Valtrex for convenience and adherence. - Prescribe Valtrex 500 mg daily for herpes prevention.  Hypertension and diabetes mellitus -Control -Continue medications as per advanced hypertension clinic   Follow-up No scheduled follow-up with gynecologist or endocrinologist. Emphasized importance of follow-up care. - Encourage her to schedule follow-up appointments with gynecologist and endocrinologist. - Send prescriptions to [facility] on [address].      Meds ordered this encounter  Medications   buPROPion (WELLBUTRIN XL) 150 MG 24 hr tablet    Sig: Take 1 tablet (150 mg total) by mouth daily.    Dispense:  90 tablet    Refill:  1   pregabalin (LYRICA) 100 MG capsule    Sig: Take 1 capsule (100 mg total) by mouth 2 (two) times daily.    Dispense:  60 capsule  Refill:  5   valACYclovir (VALTREX) 500 MG tablet    Sig: Take 1 tablet  (500 mg total) by mouth 2 (two) times daily. For herpes prophylaxis    Dispense:  90 tablet    Refill:  1    Discontinue acyclovir    Follow-up: Return in about 6 months (around 07/08/2024) for Chronic medical conditions.    Visit required 46 minutes of patient care including median intraservice time, reviewing previous notes and test results, coordination of care, counseling the patient in addition to management of chronic medical conditions.Time also spent ordering medications, investigations and documenting in the chart.  All questions were answered to the patient's satisfaction    Hoy Register, MD, FAAFP. [facility] [place], [place] [phone]   01/06/2024, 2:21 PM

## 2024-01-06 NOTE — Patient Instructions (Signed)
 VISIT SUMMARY:  During today's visit, we discussed several of your ongoing health concerns, including urinary symptoms, menopausal symptoms, chronic fatigue, depression, diabetes management, and herpes treatment. We reviewed your current treatment plans and explored new options to better manage your symptoms and improve your quality of life.  YOUR PLAN:  -URINARY SYMPTOMS: Your urinary symptoms may be related to menopause and an overactive bladder. We will conduct a urinalysis and a vaginal swab to check for any infections or other issues. We also discussed VESIcare as a potential treatment for overactive bladder and urinary incontinence. Please research this medication and consider if it might be right for you.  -MENOPAUSAL SYMPTOMS: You are experiencing significant menopausal symptoms that are affecting your quality of life. We discussed the possibility of hormone replacement therapy, and I recommend you talk to your gynecologist about this option.  -CHRONIC FATIGUE AND FIBROMYALGIA: Your chronic fatigue may be related to fibromyalgia. Since your hemoglobin and thyroid levels are normal, I am referring you to a water therapy program to help manage your chronic pain and fatigue.  -DEPRESSION AND ANXIETY: You reported feeling fatigued and emotionally distressed. It is important to continue discussing your feelings and thoughts with your counselor to help manage these symptoms.  -TYPE 2 DIABETES MELLITUS: You are managing your diabetes with medication and lifestyle changes, but you are experiencing challenges with blood sugar control. Please continue with your current diabetes management plan and monitor your blood glucose levels regularly.  -HERPES SIMPLEX VIRUS INFECTION: You are currently taking acyclovir for herpes prevention, but we discussed switching to Valtrex for convenience and better adherence. I have prescribed Valtrex 500 mg daily for you.  INSTRUCTIONS:  Please schedule follow-up  appointments with your gynecologist and endocrinologist to ensure continuous and comprehensive care. [facility] prescriptions [facility] on [address].

## 2024-01-07 ENCOUNTER — Encounter: Payer: Self-pay | Admitting: Family Medicine

## 2024-01-07 ENCOUNTER — Encounter (HOSPITAL_COMMUNITY): Payer: Self-pay | Admitting: *Deleted

## 2024-01-07 ENCOUNTER — Observation Stay (HOSPITAL_COMMUNITY): Admission: EM | Admit: 2024-01-07 | Discharge: 2024-01-09 | Disposition: A | Discharge status: Home / Self Care

## 2024-01-07 ENCOUNTER — Emergency Department (HOSPITAL_COMMUNITY)

## 2024-01-07 ENCOUNTER — Other Ambulatory Visit: Payer: Self-pay

## 2024-01-07 DIAGNOSIS — F419 Anxiety disorder, unspecified: Secondary | ICD-10-CM | POA: Diagnosis not present

## 2024-01-07 DIAGNOSIS — I129 Hypertensive chronic kidney disease with stage 1 through stage 4 chronic kidney disease, or unspecified chronic kidney disease: Secondary | ICD-10-CM | POA: Insufficient documentation

## 2024-01-07 DIAGNOSIS — F17210 Nicotine dependence, cigarettes, uncomplicated: Secondary | ICD-10-CM | POA: Insufficient documentation

## 2024-01-07 DIAGNOSIS — F1290 Cannabis use, unspecified, uncomplicated: Secondary | ICD-10-CM | POA: Diagnosis not present

## 2024-01-07 DIAGNOSIS — Z7901 Long term (current) use of anticoagulants: Secondary | ICD-10-CM | POA: Insufficient documentation

## 2024-01-07 DIAGNOSIS — Z79899 Other long term (current) drug therapy: Secondary | ICD-10-CM | POA: Insufficient documentation

## 2024-01-07 DIAGNOSIS — K219 Gastro-esophageal reflux disease without esophagitis: Secondary | ICD-10-CM | POA: Diagnosis not present

## 2024-01-07 DIAGNOSIS — Z794 Long term (current) use of insulin: Secondary | ICD-10-CM | POA: Insufficient documentation

## 2024-01-07 DIAGNOSIS — D72829 Elevated white blood cell count, unspecified: Secondary | ICD-10-CM | POA: Diagnosis not present

## 2024-01-07 DIAGNOSIS — F32A Depression, unspecified: Secondary | ICD-10-CM | POA: Diagnosis present

## 2024-01-07 DIAGNOSIS — Z6828 Body mass index (BMI) 28.0-28.9, adult: Secondary | ICD-10-CM | POA: Diagnosis not present

## 2024-01-07 DIAGNOSIS — N920 Excessive and frequent menstruation with regular cycle: Secondary | ICD-10-CM | POA: Diagnosis not present

## 2024-01-07 DIAGNOSIS — I639 Cerebral infarction, unspecified: Secondary | ICD-10-CM | POA: Diagnosis not present

## 2024-01-07 DIAGNOSIS — E1122 Type 2 diabetes mellitus with diabetic chronic kidney disease: Secondary | ICD-10-CM

## 2024-01-07 DIAGNOSIS — I6381 Other cerebral infarction due to occlusion or stenosis of small artery: Principal | ICD-10-CM | POA: Insufficient documentation

## 2024-01-07 DIAGNOSIS — E663 Overweight: Secondary | ICD-10-CM | POA: Diagnosis present

## 2024-01-07 DIAGNOSIS — N189 Chronic kidney disease, unspecified: Secondary | ICD-10-CM | POA: Diagnosis not present

## 2024-01-07 DIAGNOSIS — E785 Hyperlipidemia, unspecified: Secondary | ICD-10-CM | POA: Diagnosis not present

## 2024-01-07 DIAGNOSIS — Z8619 Personal history of other infectious and parasitic diseases: Secondary | ICD-10-CM | POA: Diagnosis not present

## 2024-01-07 DIAGNOSIS — R202 Paresthesia of skin: Secondary | ICD-10-CM | POA: Diagnosis present

## 2024-01-07 DIAGNOSIS — R29818 Other symptoms and signs involving the nervous system: Secondary | ICD-10-CM | POA: Diagnosis not present

## 2024-01-07 DIAGNOSIS — Z720 Tobacco use: Secondary | ICD-10-CM | POA: Diagnosis present

## 2024-01-07 DIAGNOSIS — F1090 Alcohol use, unspecified, uncomplicated: Secondary | ICD-10-CM | POA: Diagnosis not present

## 2024-01-07 DIAGNOSIS — I10 Essential (primary) hypertension: Secondary | ICD-10-CM | POA: Diagnosis present

## 2024-01-07 LAB — RAPID URINE DRUG SCREEN, HOSP PERFORMED
Amphetamines: NOT DETECTED
Barbiturates: NOT DETECTED
Benzodiazepines: NOT DETECTED
Cocaine: NOT DETECTED
Opiates: NOT DETECTED
Tetrahydrocannabinol: POSITIVE — AB

## 2024-01-07 LAB — DIFFERENTIAL
Abs Immature Granulocytes: 0.04 10*3/uL (ref 0.00–0.07)
Basophils Absolute: 0.1 10*3/uL (ref 0.0–0.1)
Basophils Relative: 0 %
Eosinophils Absolute: 0.1 10*3/uL (ref 0.0–0.5)
Eosinophils Relative: 1 %
Immature Granulocytes: 0 %
Lymphocytes Relative: 23 %
Lymphs Abs: 2.9 10*3/uL (ref 0.7–4.0)
Monocytes Absolute: 0.7 10*3/uL (ref 0.1–1.0)
Monocytes Relative: 6 %
Neutro Abs: 8.7 10*3/uL — ABNORMAL HIGH (ref 1.7–7.7)
Neutrophils Relative %: 70 %

## 2024-01-07 LAB — CBC
HCT: 42.7 % (ref 36.0–46.0)
Hemoglobin: 14.5 g/dL (ref 12.0–15.0)
MCH: 32.2 pg (ref 26.0–34.0)
MCHC: 34 g/dL (ref 30.0–36.0)
MCV: 94.7 fL (ref 80.0–100.0)
Platelets: 346 10*3/uL (ref 150–400)
RBC: 4.51 MIL/uL (ref 3.87–5.11)
RDW: 13.9 % (ref 11.5–15.5)
WBC: 12.5 10*3/uL — ABNORMAL HIGH (ref 4.0–10.5)
nRBC: 0 % (ref 0.0–0.2)

## 2024-01-07 LAB — COMPREHENSIVE METABOLIC PANEL
ALT: 21 U/L (ref 0–44)
AST: 19 U/L (ref 15–41)
Albumin: 3.2 g/dL — ABNORMAL LOW (ref 3.5–5.0)
Alkaline Phosphatase: 36 U/L — ABNORMAL LOW (ref 38–126)
Anion gap: 11 (ref 5–15)
BUN: 18 mg/dL (ref 6–20)
CO2: 22 mmol/L (ref 22–32)
Calcium: 9.2 mg/dL (ref 8.9–10.3)
Chloride: 107 mmol/L (ref 98–111)
Creatinine, Ser: 1 mg/dL (ref 0.44–1.00)
GFR, Estimated: >60 mL/min (ref >60)
Glucose, Bld: 117 mg/dL — ABNORMAL HIGH (ref 70–99)
Potassium: 4.4 mmol/L (ref 3.5–5.1)
Sodium: 140 mmol/L (ref 135–145)
Total Bilirubin: 0.4 mg/dL (ref 0.0–1.2)
Total Protein: 6.3 g/dL — ABNORMAL LOW (ref 6.5–8.1)

## 2024-01-07 LAB — CERVICOVAGINAL ANCILLARY ONLY
Bacterial Vaginitis (gardnerella): NEGATIVE
Candida Glabrata: NEGATIVE
Candida Vaginitis: NEGATIVE
Chlamydia: NEGATIVE
Comment: NEGATIVE
Comment: NEGATIVE
Comment: NEGATIVE
Comment: NEGATIVE
Comment: NEGATIVE
Comment: NORMAL
Neisseria Gonorrhea: NEGATIVE
Trichomonas: NEGATIVE

## 2024-01-07 LAB — ETHANOL: Alcohol, Ethyl (B): <10 mg/dL (ref <10)

## 2024-01-07 LAB — APTT: aPTT: 26 s (ref 24–36)

## 2024-01-07 LAB — I-STAT CHEM 8, ED
BUN: 20 mg/dL (ref 6–20)
Calcium, Ion: 1.12 mmol/L — ABNORMAL LOW (ref 1.15–1.40)
Chloride: 107 mmol/L (ref 98–111)
Creatinine, Ser: 1.1 mg/dL — ABNORMAL HIGH (ref 0.44–1.00)
Glucose, Bld: 110 mg/dL — ABNORMAL HIGH (ref 70–99)
HCT: 43 % (ref 36.0–46.0)
Hemoglobin: 14.6 g/dL (ref 12.0–15.0)
Potassium: 4.1 mmol/L (ref 3.5–5.1)
Sodium: 140 mmol/L (ref 135–145)
TCO2: 23 mmol/L (ref 22–32)

## 2024-01-07 LAB — URINALYSIS, ROUTINE W REFLEX MICROSCOPIC
Bilirubin Urine: NEGATIVE
Glucose, UA: NEGATIVE mg/dL
Hgb urine dipstick: NEGATIVE
Ketones, ur: NEGATIVE mg/dL
Leukocytes,Ua: NEGATIVE
Nitrite: NEGATIVE
Protein, ur: NEGATIVE mg/dL
Specific Gravity, Urine: 1.024 (ref 1.005–1.030)
pH: 5 (ref 5.0–8.0)

## 2024-01-07 LAB — PREGNANCY, URINE: Preg Test, Ur: NEGATIVE

## 2024-01-07 LAB — PROTIME-INR
INR: 1 (ref 0.8–1.2)
Prothrombin Time: 13.6 s (ref 11.4–15.2)

## 2024-01-07 NOTE — ED Provider Triage Note (Signed)
 Emergency Medicine Provider Triage Evaluation Note  Amber Rose , a 48 y.o. female  was evaluated in triage.  Pt complains of right-sided numbness and weakness beginning last night at midnight.  Reports noticed initially went from a sitting to standing position.  Reports that the numbness is intermittent, typically when she goes from sitting to a standing position she experiences it.  She states that earlier she would try to grip a cup in her right hand but was having trouble gripping it.  Denies history of CVA.  Denies blood thinners.  Denies slurred speech or facial droop.  Denies chest pain, shortness of breath.  Denies word finding difficulties.  Review of Systems  Positive:  Negative:   Physical Exam  There were no vitals taken for this visit. Gen:   Awake, no distress   Resp:  Normal effort  MSK:   Moves extremities without difficulty  Other:  CN II through XII intact.  Patient does have some reduced grip strength right upper extremity throughout 5.  Reduced strength right lower extremity 4 out of 5.  No facial droop, no slurred speech.  Patient does have some issues with dysmetria, finger-to-nose.  Medical Decision Making  Medically screening exam initiated at 9:31 PM.  Appropriate orders placed.  Amber Rose was informed that the remainder of the evaluation will be completed by another provider, this initial triage assessment does not replace that evaluation, and the importance of remaining in the ED until their evaluation is complete.   Patient outside the window.  ED stroke order set initiated, CT head ordered.   Al Decant, PA-C 01/07/24 2132

## 2024-01-07 NOTE — ED Triage Notes (Signed)
 The pt reports that she has numbness in the rt side of body and a lt sided headache  today   the pa saw before me

## 2024-01-08 ENCOUNTER — Emergency Department (HOSPITAL_COMMUNITY)

## 2024-01-08 ENCOUNTER — Observation Stay (HOSPITAL_COMMUNITY)

## 2024-01-08 DIAGNOSIS — I6381 Other cerebral infarction due to occlusion or stenosis of small artery: Secondary | ICD-10-CM | POA: Diagnosis not present

## 2024-01-08 DIAGNOSIS — R29818 Other symptoms and signs involving the nervous system: Secondary | ICD-10-CM | POA: Diagnosis not present

## 2024-01-08 DIAGNOSIS — Z794 Long term (current) use of insulin: Secondary | ICD-10-CM

## 2024-01-08 DIAGNOSIS — E785 Hyperlipidemia, unspecified: Secondary | ICD-10-CM | POA: Diagnosis not present

## 2024-01-08 DIAGNOSIS — E1122 Type 2 diabetes mellitus with diabetic chronic kidney disease: Secondary | ICD-10-CM

## 2024-01-08 DIAGNOSIS — I6521 Occlusion and stenosis of right carotid artery: Secondary | ICD-10-CM | POA: Diagnosis not present

## 2024-01-08 DIAGNOSIS — D72829 Elevated white blood cell count, unspecified: Secondary | ICD-10-CM | POA: Diagnosis not present

## 2024-01-08 DIAGNOSIS — Z8619 Personal history of other infectious and parasitic diseases: Secondary | ICD-10-CM | POA: Diagnosis not present

## 2024-01-08 DIAGNOSIS — K219 Gastro-esophageal reflux disease without esophagitis: Secondary | ICD-10-CM | POA: Diagnosis not present

## 2024-01-08 DIAGNOSIS — F32A Depression, unspecified: Secondary | ICD-10-CM

## 2024-01-08 DIAGNOSIS — F419 Anxiety disorder, unspecified: Secondary | ICD-10-CM

## 2024-01-08 DIAGNOSIS — I10 Essential (primary) hypertension: Secondary | ICD-10-CM

## 2024-01-08 DIAGNOSIS — E663 Overweight: Secondary | ICD-10-CM | POA: Diagnosis present

## 2024-01-08 DIAGNOSIS — Z720 Tobacco use: Secondary | ICD-10-CM | POA: Diagnosis not present

## 2024-01-08 DIAGNOSIS — I639 Cerebral infarction, unspecified: Secondary | ICD-10-CM | POA: Diagnosis not present

## 2024-01-08 DIAGNOSIS — G9389 Other specified disorders of brain: Secondary | ICD-10-CM | POA: Diagnosis not present

## 2024-01-08 LAB — LIPID PANEL
Cholesterol: 231 mg/dL — ABNORMAL HIGH (ref 0–200)
HDL: 23 mg/dL — ABNORMAL LOW (ref >40)
LDL Cholesterol: 175 mg/dL — ABNORMAL HIGH (ref 0–99)
Total CHOL/HDL Ratio: 10 ratio
Triglycerides: 165 mg/dL — ABNORMAL HIGH (ref <150)
VLDL: 33 mg/dL (ref 0–40)

## 2024-01-08 LAB — CBG MONITORING, ED
Glucose-Capillary: 173 mg/dL — ABNORMAL HIGH (ref 70–99)
Glucose-Capillary: 158 mg/dL — ABNORMAL HIGH (ref 70–99)
Glucose-Capillary: 135 mg/dL — ABNORMAL HIGH (ref 70–99)
Glucose-Capillary: 178 mg/dL — ABNORMAL HIGH (ref 70–99)

## 2024-01-08 LAB — HEMOGLOBIN A1C
Hgb A1c MFr Bld: 6.4 % — ABNORMAL HIGH (ref 4.8–5.6)
Mean Plasma Glucose: 136.98 mg/dL

## 2024-01-08 LAB — HIV ANTIBODY (ROUTINE TESTING W REFLEX): HIV Screen 4th Generation wRfx: NONREACTIVE

## 2024-01-08 LAB — GLUCOSE, CAPILLARY: Glucose-Capillary: 134 mg/dL — ABNORMAL HIGH (ref 70–99)

## 2024-01-08 MED ORDER — NORETHINDRONE ACETATE 5 MG PO TABS
5.0000 mg | ORAL_TABLET | Freq: Three times a day (TID) | ORAL | Status: DC
  Administered 2024-01-08 – 2024-01-09 (×4): 5 mg via ORAL
  Filled 2024-01-08 (×6): qty 1

## 2024-01-08 MED ORDER — ACETAMINOPHEN 500 MG PO TABS
1000.0000 mg | ORAL_TABLET | Freq: Once | ORAL | Status: AC
  Administered 2024-01-08: 1000 mg via ORAL
  Filled 2024-01-08: qty 2

## 2024-01-08 MED ORDER — ACETAMINOPHEN 325 MG PO TABS: 650.0000 mg | ORAL_TABLET | ORAL | Status: DC | PRN

## 2024-01-08 MED ORDER — INSULIN ASPART 100 UNIT/ML IJ SOLN
0.0000 [IU] | Freq: Three times a day (TID) | INTRAMUSCULAR | Status: DC
  Administered 2024-01-08: 2 [IU] via SUBCUTANEOUS
  Administered 2024-01-08: 1 [IU] via SUBCUTANEOUS
  Administered 2024-01-08: 2 [IU] via SUBCUTANEOUS
  Administered 2024-01-09: 1 [IU] via SUBCUTANEOUS

## 2024-01-08 MED ORDER — METHOCARBAMOL 500 MG PO TABS
750.0000 mg | ORAL_TABLET | Freq: Three times a day (TID) | ORAL | Status: DC | PRN
  Administered 2024-01-08: 750 mg via ORAL
  Filled 2024-01-08: qty 2

## 2024-01-08 MED ORDER — HYDRALAZINE HCL 20 MG/ML IJ SOLN: 10.0000 mg | INTRAMUSCULAR | Status: DC | PRN

## 2024-01-08 MED ORDER — SACCHAROMYCES BOULARDII 250 MG PO CAPS
250.0000 mg | ORAL_CAPSULE | Freq: Two times a day (BID) | ORAL | Status: DC
Start: 2024-01-08 — End: 2024-01-09
  Administered 2024-01-08 – 2024-01-09 (×3): 250 mg via ORAL
  Filled 2024-01-08 (×4): qty 1

## 2024-01-08 MED ORDER — IOHEXOL 350 MG/ML SOLN
75.0000 mL | Freq: Once | INTRAVENOUS | Status: AC | PRN
  Administered 2024-01-08: 75 mL via INTRAVENOUS

## 2024-01-08 MED ORDER — ASPIRIN 81 MG PO TBEC
81.0000 mg | DELAYED_RELEASE_TABLET | Freq: Every day | ORAL | Status: DC
  Administered 2024-01-08 – 2024-01-09 (×2): 81 mg via ORAL
  Filled 2024-01-08 (×2): qty 1

## 2024-01-08 MED ORDER — SODIUM CHLORIDE 0.9 % IV SOLN: INTRAVENOUS | Status: AC

## 2024-01-08 MED ORDER — NICOTINE 21 MG/24HR TD PT24
21.0000 mg | MEDICATED_PATCH | Freq: Every day | TRANSDERMAL | Status: DC
  Administered 2024-01-08 – 2024-01-09 (×2): 21 mg via TRANSDERMAL
  Filled 2024-01-08 (×2): qty 1

## 2024-01-08 MED ORDER — INSULIN GLARGINE 100 UNIT/ML ~~LOC~~ SOLN
25.0000 [IU] | Freq: Every day | SUBCUTANEOUS | Status: DC
  Administered 2024-01-08: 25 [IU] via SUBCUTANEOUS
  Filled 2024-01-08 (×4): qty 0.25

## 2024-01-08 MED ORDER — HYDROXYZINE HCL 25 MG PO TABS
50.0000 mg | ORAL_TABLET | Freq: Three times a day (TID) | ORAL | Status: DC | PRN
  Administered 2024-01-08 (×2): 50 mg via ORAL
  Filled 2024-01-08 (×2): qty 2

## 2024-01-08 MED ORDER — PREGABALIN 100 MG PO CAPS
100.0000 mg | ORAL_CAPSULE | Freq: Two times a day (BID) | ORAL | Status: DC
  Administered 2024-01-08 – 2024-01-09 (×3): 100 mg via ORAL
  Filled 2024-01-08 (×3): qty 1

## 2024-01-08 MED ORDER — INSULIN GLARGINE-YFGN 100 UNIT/ML ~~LOC~~ SOLN: 25.0000 [IU] | Freq: Every day | SUBCUTANEOUS | Status: DC

## 2024-01-08 MED ORDER — ACETAMINOPHEN 160 MG/5ML PO SOLN: 650.0000 mg | ORAL | Status: DC | PRN

## 2024-01-08 MED ORDER — SODIUM CHLORIDE 0.9 % IV BOLUS
1000.0000 mL | Freq: Once | INTRAVENOUS | Status: AC
Start: 2024-01-08 — End: 2024-01-08
  Administered 2024-01-08: 1000 mL via INTRAVENOUS

## 2024-01-08 MED ORDER — ENOXAPARIN SODIUM 40 MG/0.4ML IJ SOSY
40.0000 mg | PREFILLED_SYRINGE | INTRAMUSCULAR | Status: DC
  Administered 2024-01-08 – 2024-01-09 (×2): 40 mg via SUBCUTANEOUS
  Filled 2024-01-08 (×2): qty 0.4

## 2024-01-08 MED ORDER — STROKE: EARLY STAGES OF RECOVERY BOOK
Freq: Once | Status: AC
  Filled 2024-01-08: qty 1

## 2024-01-08 MED ORDER — PROCHLORPERAZINE EDISYLATE 10 MG/2ML IJ SOLN
10.0000 mg | Freq: Once | INTRAMUSCULAR | Status: AC
  Administered 2024-01-08: 10 mg via INTRAVENOUS
  Filled 2024-01-08: qty 2

## 2024-01-08 MED ORDER — VALACYCLOVIR HCL 500 MG PO TABS
500.0000 mg | ORAL_TABLET | Freq: Two times a day (BID) | ORAL | Status: DC
Start: 2024-01-08 — End: 2024-01-09
  Administered 2024-01-08 – 2024-01-09 (×3): 500 mg via ORAL
  Filled 2024-01-08 (×5): qty 1

## 2024-01-08 MED ORDER — HYDROXYZINE HCL 25 MG PO TABS
25.0000 mg | ORAL_TABLET | Freq: Once | ORAL | Status: AC
  Administered 2024-01-08: 25 mg via ORAL
  Filled 2024-01-08: qty 1

## 2024-01-08 MED ORDER — SENNOSIDES-DOCUSATE SODIUM 8.6-50 MG PO TABS: 1.0000 | ORAL_TABLET | Freq: Every evening | ORAL | Status: DC | PRN

## 2024-01-08 MED ORDER — CLOPIDOGREL BISULFATE 75 MG PO TABS
75.0000 mg | ORAL_TABLET | Freq: Every day | ORAL | Status: DC
Start: 2024-01-08 — End: 2024-01-29
  Administered 2024-01-08 – 2024-01-09 (×2): 75 mg via ORAL
  Filled 2024-01-08 (×2): qty 1

## 2024-01-08 MED ORDER — BUPROPION HCL ER (XL) 150 MG PO TB24
150.0000 mg | ORAL_TABLET | Freq: Every day | ORAL | Status: DC
  Administered 2024-01-08 – 2024-01-09 (×2): 150 mg via ORAL
  Filled 2024-01-08 (×2): qty 1

## 2024-01-08 MED ORDER — PANTOPRAZOLE SODIUM 40 MG PO TBEC
40.0000 mg | DELAYED_RELEASE_TABLET | Freq: Every day | ORAL | Status: DC
  Administered 2024-01-08 – 2024-01-09 (×2): 40 mg via ORAL
  Filled 2024-01-08 (×2): qty 1

## 2024-01-08 MED ORDER — ACETAMINOPHEN 650 MG RE SUPP: 650.0000 mg | RECTAL | Status: DC | PRN

## 2024-01-08 NOTE — Evaluation (Signed)
 Occupational Therapy Evaluation Patient Details Name: Amber Rose MRN: [id number] DOB: 06/19/1976 Today's Date: 01/08/2024   History of Present Illness   Pt is a 48 yo female presenting to [facility] on 01/07/24 with R sided numbness found to have a L thalamic stroke on imaging. PMH of anemia, DM, fibromyalgia, pancreatitis,  HLD, HTN.     Clinical Impressions Pt admitted for above, PTA pt was independent in ADLs/iADLs and ind with mobility. Pt currently presents with continued RUE/RLE numbness and impaired coordination of her dominant RUE impacting her capacity to safely complete ADLs/iADLs. Pt also remains grossly unsteady and needing CGA to ambulate but veers off the R. Educated her on RUE coordination exercises. OT to continue following pt acutely to address listed deficits and help transition to next level of care. Recommend pt follow up with outpatient OT for coordination deficits, would benefit from more balance/gait training with PT in acute stay before determining full safety with getting to outpatient.      If plan is discharge home, recommend the following:   Assistance with cooking/housework     Functional Status Assessment   Patient has had a recent decline in their functional status and demonstrates the ability to make significant improvements in function in a reasonable and predictable amount of time.     Equipment Recommendations   None recommended by OT     Recommendations for Other Services         Precautions/Restrictions   Precautions Precautions: Fall Recall of Precautions/Restrictions: Intact Restrictions Weight Bearing Restrictions Per Provider Order: No     Mobility Bed Mobility Overal bed mobility: Modified Independent                  Transfers Overall transfer level: Needs assistance Equipment used: None Transfers: Sit to/from Stand Sit to Stand: Contact guard assist                  Balance Overall balance  assessment: Needs assistance Sitting-balance support: Feet unsupported, No upper extremity supported Sitting balance-Leahy Scale: Fair Sitting balance - Comments: sitting edge of stretcher   Standing balance support: During functional activity Standing balance-Leahy Scale: Fair Standing balance comment: pt unable to ambulate in a straight line, veers R.                           ADL either performed or assessed with clinical judgement   ADL Overall ADL's : Needs assistance/impaired Eating/Feeding: Sitting;Modified independent   Grooming: Standing;Contact guard assist;Oral care Grooming Details (indicate cue type and reason): Pt can open toothpaste, has a hard time manipulating toothbrush with R hand Upper Body Bathing: Sitting;Contact guard assist   Lower Body Bathing: Sitting/lateral leans;Set up   Upper Body Dressing : Sitting;Supervision/safety   Lower Body Dressing: Sitting/lateral leans;Supervision/safety   Toilet Transfer: Nurse, mental health and Hygiene: Contact guard assist;Sit to/from stand       Functional mobility during ADLs: Contact guard assist General ADL Comments: Educated pt on [facility] exercises to perform at home, handout given.     Vision Baseline Vision/History: 0 No visual deficits Ability to See in Adequate Light: 0 Adequate Patient Visual Report: No change from baseline Vision Assessment?: Yes Eye Alignment: Within Functional Limits Ocular Range of Motion: Within Functional Limits Alignment/Gaze Preference: Within Defined Limits Tracking/Visual Pursuits: Decreased smoothness of horizontal tracking Saccades: Within functional limits Convergence: Within functional limits Visual Fields: No apparent deficits Additional Comments: Letter E cancellation  testing intact, pt scanning and reading without challenge.     Perception Perception: Not tested       Praxis Praxis: WFL       Pertinent  Vitals/Pain Pain Assessment Pain Assessment: No/denies pain     Extremity/Trunk Assessment Upper Extremity Assessment Upper Extremity Assessment: Right hand dominant;RUE deficits/detail (strength and ROM overall WFL.) RUE Deficits / Details: decreased sensation throughout entirety of RUE. can perform thumb opposition with increased time, undershoots from impaired coordination. RUE Sensation: decreased light touch RUE Coordination: decreased fine motor;decreased gross motor   Lower Extremity Assessment Lower Extremity Assessment: Defer to PT evaluation;RLE deficits/detail RLE Sensation: decreased light touch       Communication Communication Communication: No apparent difficulties   Cognition Arousal: Alert Behavior During Therapy: WFL for tasks assessed/performed Cognition: No apparent impairments                               Following commands: Intact       Cueing  General Comments   Cueing Techniques: Verbal cues  Bp stable, 138/100 in standing.   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      Prior Functioning/Environment Prior Level of Function : Independent/Modified Independent;Driving;Working/employed             Mobility Comments: Ind ADLs Comments: ind    OT Problem List: Impaired balance (sitting and/or standing);Impaired  sensation;Decreased coordination   OT Treatment/Interventions: Self-care/ADL training;Balance training;DME and/or AE instruction;Therapeutic exercise;Therapeutic activities;Patient/family education      OT Goals(Current goals can be found in the care plan section)   Acute Rehab OT Goals Patient Stated Goal: To go home; get back to "normal" OT Goal Formulation: With patient Time For Goal Achievement: 01/22/24 Potential to Achieve Goals: Good ADL Goals Pt Will Perform Grooming: with modified independence;standing Pt Will Perform Lower Body Bathing: sit to/from stand;Independently Pt Will Perform Lower Body Dressing: sit to/from stand;Independently Pt Will Transfer to Toilet: Independently;ambulating Pt Will Perform Toileting - Clothing Manipulation and hygiene: Independently;sit to/from stand Additional ADL Goal #1: Pt will independently complete her [facility] HEP using the RUE.   OT Frequency:  Min 2X/week    Co-evaluation              AM-PAC OT "6 Clicks" Daily Activity     Outcome Measure Help from another person eating meals?: None Help from another person taking care of personal grooming?: A Little Help from another person toileting, which includes using toliet, bedpan, or urinal?: A Little Help from another person bathing (including washing, rinsing, drying)?: A Little Help from another person to put on and taking off regular upper body clothing?: A Little Help from another person to put on and taking off regular lower body clothing?: A Little 6 Click Score: 19   End of Session Equipment Utilized During Treatment: Gait belt Nurse Communication: Mobility status  Activity Tolerance: Patient  tolerated treatment well Patient left: in bed;with call bell/phone within reach  OT Visit Diagnosis: Unsteadiness on feet (R26.81);Other abnormalities of gait and mobility (R26.89);Hemiplegia and hemiparesis Hemiplegia - Right/Left: Right Hemiplegia - dominant/non-dominant:  Dominant Hemiplegia - caused by: Cerebral infarction                Time: 5784-6962 OT Time Calculation (min): 44 min Charges:  OT General Charges $OT Visit: 1 Visit OT Evaluation $OT Eval Moderate Complexity: 1 Mod OT Treatments $Self Care/Home Management : 8-22 mins $Therapeutic Activity: 8-22 mins  01/08/2024  AB, OTR/L  Acute Rehabilitation Services  Office: [phone]   Tristan Schroeder 01/08/2024, 5:41 PM

## 2024-01-08 NOTE — H&P (Addendum)
 History and Physical    Patient: Amber Rose:[id number] DOB: [birth year] DOA: 01/07/2024 DOS: the patient was seen and examined on 01/08/2024 PCP: Hoy Register, MD  Patient coming from: Home  Chief Complaint:  Chief Complaint  Patient presents with   numbness  rt body   HPI: Amber Rose is a 48 y.o. female with medical history significant of hypertension, hyperlipidemia, diabetes mellitus type 2, migraine headaches, hidradenitis, HSV, anxiety, depression, tobacco abuse, and GERD who presents with complaints of right-sided numbness.  She reported having acute onset of right-sided numbness and weakness that began around midnight yesterday morning while she was at work.  She stood up and felt numbness on the entire right side of her body, from her face down to her toes. Her lip felt 'really numb' but there was no facial drooping. She noticed slight stumbling and coordination issues, though not severe enough to cause limping or inability to stand. 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 After her shift, she went home, slept, and upon waking, realized the numbness persisted. She decided to go to the hospital this morning due to persistence of symptoms.  She recalls a similar episode about a month ago, where she experienced numbness in her left arm, which she reported to a doctor but was dismissed.  She mentions having headaches over the past few months, primarily on the left posterior side of her head.  In January, she experienced a severe illness with fever and chills, which was not COVID-19, but left her with a lingering cough and upper respiratory symptoms until mid-February. She also reports experiencing constant heat flashes, which she a questioned if she was in menopause but found not to be on recent testing.  She smokes about a pack to a pack and a half of cigarettes per day.  Of note she reports recently  restarting Ozempic.  In the emergency department patient was noted to be afebrile with blood pressures elevated up to 150/98.  CT scan of the head did not note any acute abnormality.  Patient was not a candidate for thrombolytics due to being outside the window and noted to only have mild symptoms.Labs significant for WBC 12.5.  MRI of the brain was obtained which noted acute lacunar infarct in left thalamus.  Neurology had been consulted recommending admission with recommendations to complete stroke workup.  Patient had been given 1 L of normal saline IV fluids,, Compazine, hydroxyzine, acetaminophen, aspirin and Plavix. Review of Systems: As mentioned in the history of present illness. All other systems reviewed and are negative. Past Medical History:  Diagnosis Date   Abnormal Pap smear of cervix    yrs ago   Adenomyosis    Allergy    Anemia    Anxiety    Aortic atherosclerosis (HCC)    Arthritis    Asthma    Chronic UTI    Clostridium difficile infection    Depression    Diabetes mellitus without complication (HCC) 10/22/2023   A1C 8.8   Diverticulosis    Endometriosis    Fibroid    Fibromyalgia    GERD (gastroesophageal reflux disease)    Hiatal hernia    Hyperlipidemia    Hypertension    Internal hemorrhoids    Meningitis, viral    Migraines    Pancreatitis    Pure hypercholesterolemia 12/23/2020   Tubular adenoma of colon    Past Surgical History:  Procedure Laterality Date  APPENDECTOMY  1990   CERVICAL BIOPSY  W/ LOOP ELECTRODE EXCISION     LEEP   CESAREAN SECTION  2002   CHOLECYSTECTOMY  2011   COLONOSCOPY     UPPER GASTROINTESTINAL ENDOSCOPY     URINARY SURGERY     urethra sling, removal, and then revision 2016 (4 surgeries)   WISDOM TOOTH EXTRACTION     Social History:  reports that she has been smoking cigarettes. She has a 2 pack-year smoking history. She has never used smokeless tobacco. She reports current alcohol use. She reports current drug use.  Drug: Marijuana.  Allergies  Allergen Reactions   Amlodipine     Leg pain, swelling around eyes    Atorvastatin Other (See Comments)     Joint pain   Crestor [Rosuvastatin]     Joint pain    Irbesartan     Leg pain, swelling around eyes   Metronidazole Nausea And Vomiting   Penicillins     childhood   Xanax [Alprazolam]     "Caused upper respiratory symptoms" per pt   Glyburide Other (See Comments)    "blood sugar dropped uncontrollably"   Linagliptin Diarrhea and Other (See Comments)    Stomach pain, sinus infection   Moxifloxacin Diarrhea    Family History  Problem Relation Age of Onset   Cancer Mother        cervical, breast, lung, skin, bladder-ex smoker   Hypertension Mother    Diabetes Father    Heart attack Maternal Grandfather    Multiple sclerosis Paternal Grandmother     Prior to Admission medications   Medication Sig Start Date End Date Taking? Authorizing Provider  hydrOXYzine (ATARAX) 50 MG tablet Take 1 tablet (50 mg total) by mouth 3 (three) times daily as needed. Patient taking differently: Take 50 mg by mouth at bedtime as needed (for sleep). 09/21/23  Yes Alfonse Spruce, MD  ibuprofen (ADVIL) 800 MG tablet TAKE 1 TABLET BY MOUTH EVERY 12  HOURS AS NEEDED 09/21/23  Yes Newlin, Enobong, MD  insulin glargine (LANTUS SOLOSTAR) 100 UNIT/ML Solostar Pen Inject 25 Units into the skin daily. Increase by 2 units to a maximum daily dose of 30 units every 4th day until blood sugars are at goal 09/20/23  Yes Hoy Register, MD  metFORMIN (GLUCOPHAGE) 500 MG tablet Take 2 tablets (1,000 mg total) by mouth 2 (two) times daily with a meal. 11/25/23  Yes Motwani, Komal, MD  methocarbamol (ROBAXIN) 750 MG tablet Take 1 tablet (750 mg total) by mouth every 8 (eight) hours as needed for muscle spasms. 02/24/22  Yes Hoy Register, MD  norethindrone (AYGESTIN) 5 MG tablet TAKE 1 TABLET BY MOUTH THREE TIMES A DAY 12/20/23  Yes Jerene Bears, MD  omeprazole (PRILOSEC) 40  MG capsule TAKE 1 CAPSULE BY MOUTH EVERY  MORNING 08/09/23  Yes Hoy Register, MD  pregabalin (LYRICA) 100 MG capsule Take 1 capsule (100 mg total) by mouth 2 (two) times daily. 01/06/24  Yes Hoy Register, MD  saccharomyces boulardii (FLORASTOR) 250 MG capsule Take 1 capsule (250 mg total) by mouth 2 (two) times daily. 05/12/22  Yes Unk Lightning, PA  Semaglutide,0.25 or 0.5MG /DOS, 2 MG/3ML SOPN Inject 0.25 mg into the skin once a week. 11/25/23  Yes Motwani, Carin Hock, MD  spironolactone (ALDACTONE) 50 MG tablet Take 1 tablet (50 mg total) by mouth daily. 09/16/23  Yes Chilton Si, MD  valACYclovir (VALTREX) 500 MG tablet Take 1 tablet (500 mg total) by mouth 2 (two)  times daily. For herpes prophylaxis 01/06/24  Yes Hoy Register, MD  albuterol (PROAIR HFA) 108 (90 Base) MCG/ACT inhaler Inhale 2 puffs into the lungs every 4 (four) hours as needed for wheezing or shortness of breath. Patient not taking: Reported on 01/08/2024 04/05/18   Hoy Register, MD  Blood Glucose Monitoring Suppl (ACCU-CHEK AVIVA PLUS) w/Device KIT USE AS DIRECTED DAILY. E11.9 09/13/17   Hoy Register, MD  buPROPion (WELLBUTRIN XL) 150 MG 24 hr tablet Take 1 tablet (150 mg total) by mouth daily. Patient not taking: Reported on 01/08/2024 01/06/24   Hoy Register, MD  Continuous Blood Gluc Receiver (FREESTYLE LIBRE 2 READER) DEVI Use to check blood sugar three times daily. E11.49 11/06/22   Hoy Register, MD  Continuous Glucose Sensor (FREESTYLE LIBRE 2 SENSOR) MISC USE TO CHECK BLOOD SUGAR 3 TIMES A DAY - CHANGE SENSOR EVERY 14 DAYS 12/10/23   Hoy Register, MD  glucose blood (FREESTYLE TEST STRIPS) test strip Use to check blood sugar three times daily. E11.49 02/16/23   Hoy Register, MD  Insulin Pen Needle 31G X 5 MM MISC 1 each by Does not apply route at bedtime. 08/25/23   Hoy Register, MD  Lactic Ac-Citric Ac-Pot Bitart (PHEXXI) 1.8-1-0.4 % GEL Place 1 Applicatorful vaginally as directed. Place in  vagina one hour prior to intercourse Patient not taking: Reported on 01/08/2024 11/18/23   Earley Favor, MD  Lancet Devices [facility]) lancets Use as instructed daily. 05/14/17   Hoy Register, MD  Lancets (FREESTYLE) lancets Use to check blood sugar three times daily. E11.49 02/16/23   Hoy Register, MD  LUPRON DEPOT, 54-MONTH, 11.25 MG injection Inject 11.25 mg into the muscle every 3 (three) months. Patient not taking: Reported on 01/08/2024 12/23/23   [provider]  Prasterone (INTRAROSA) 6.5 MG INST Place 1 suppository vaginally at bedtime as needed. Patient not taking: Reported on 01/08/2024 10/13/23   Earley Favor, MD    Physical Exam: Vitals:   01/08/24 0445 01/08/24 0507 01/08/24 0509 01/08/24 0515  BP: (!) 137/97     Pulse: 70 66  82  Resp: 15 13  19   Temp:   98 F (36.7 C)   TempSrc:   Axillary   SpO2: 94% 96%  97%  Weight:      Height:        Constitutional: Middle-age female female currently in no acute Eyes: PERRL, lids and conjunctivae normal ENMT: Mucous membranes are moist. Posterior pharynx clear of any exudate or lesions.Normal dentition.  Neck: normal, supple   Respiratory: clear to auscultation bilaterally, no wheezing, no crackles. Normal respiratory effort. No accessory muscle use.  Cardiovascular: Regular rate and rhythm, no murmurs / rubs / gallops. No extremity edema. 2+ pedal pulses. No carotid bruits.  Abdomen: no tenderness, no masses palpated.  Bowel sounds positive.  Musculoskeletal: no clubbing / cyanosis. No joint deformity upper and lower extremities. Good ROM, no contractures. Normal muscle tone.  Skin: no rashes, lesions, ulcers. No induration Neurologic: CN 2-12 grossly intact.  Abnormal sensation still present but improved on right side Psychiatric: Normal judgment and insight. Alert and oriented x 3. Normal mood.   Data Reviewed:  EKG revealed normal sinus rhythm at 72 bpm.  Reviewed labs, imaging, and  pertinent records as documented.  Assessment and Plan:   CVA Patient presents with complaints of right sided numbness started yesterday morning at around midnight 2 days.  Initial CT scan of the head showed no acute abnormality.  Patient was not  a candidate for thrombolytics due to being outside the window.  MRI of the head revealed acute lacunar infarct in left thalamus. -Admit to a cardiac telemetry bed -Neurochecks -Follow-up lipid panel and hemoglobin A1c -Follow-up CT angiogram of the head and neck -Check -PT/OT/speech to evaluate and treat -Aspirin and Plavix -Follow-up telemetry -Appreciate neurology consultative services, we will follow-up for any further recommendations  Leukocytosis Acute.  WBC elevated at 12.5.  Urinalysis did not show any signs for infection.  Thought reactive secondary to above. -Recheck CBC tomorrow morning  Essential hypertension Blood pressures initially elevated up to 185/96. Allowing for permissive hypertension.  Diabetes mellitus type 2, with long-term use of insulin Last hemoglobin A1c noted to be 7.7 on 11/22/2023. -Hypoglycemic protocols -Repeat hemoglobin A1c 6.4 -Continue long-acting insulin -CBGs before every meal with sensitive SSI  Hyperlipidemia Lipid panel revealed total cholesterol 231, LDL 175, triglycerides 165, and HDL 23. -May benefit from Repatha in outpatient setting  History of herpes zoster -Continue prophylactic Valtrex  Anxiety and depression -Continue Atarax  Menorrhagia Patient reports a history of heavy bleeding for which she is on northindone. -Continue Northindone   Tobacco abuse Patient reports smoking a pack cigarettes per day on average. -Counseled on the need of cessation of tobacco use -Nicotine patch offered -Wellbutrin resumed to help assist in smoking cessation  GERD -Continue pharmacy substitution of Protonix  Overweight BMI 28.42 kg/m.  Patient is on Ozempic. -Continue open Ozempic in the  outpatient setting  DVT prophylaxis: Lovenox Advance Care Planning:   Code Status: Not on file   Consults: Neurology  Family Communication: None requested  Severity of Illness: The appropriate patient status for this patient is OBSERVATION. Observation status is judged to be reasonable and necessary in order to provide the required intensity of service to ensure the patient's safety. The patient's presenting symptoms, physical exam findings, and initial radiographic and laboratory data in the context of their medical condition is felt to place them at decreased risk for further clinical deterioration. Furthermore, it is anticipated that the patient will be medically stable for discharge from the hospital within 2 midnights of admission.   Author: Clydie Braun, MD 01/08/2024 7:28 AM  For on call review [email or web address].

## 2024-01-08 NOTE — Consult Note (Signed)
 NEUROLOGY CONSULT NOTE   Date of service: January 08, 2024 Patient Name: Amber Rose MRN:  [id number] DOB:  11/21/1975 Chief Complaint: "R sided numbness, L thalamic stroke" Requesting Provider: Coral Spikes, DO  History of Present Illness  Kiannah Grunow is a 48 y.o. female with hx of DM2, GERD, HTN, HLD, who presents with R sided numbness and found to have L thalamic stroke.  She reports that she works third shift and was working Thursday night when around 0001 on 01/07/2024, she felt numbness starting in her right lower face, right arm and right leg.  She thought that this would go away but this persisted for the rest of the day.  She came to the ED for further evaluation and workup.  She denies any prior history of strokes, no family history of strokes, she endorses history of diabetes, hypertension and hyperlipidemia.  She smokes 1 pack a day.  LKW: 0001 on 01/07/2024 Modified rankin score: 0-Completely asymptomatic and back to baseline post- stroke IV Thrombolysis: Not offered, she is outside the window and she is too mild to treat.   EVT: Not offered, low suspicion for LVO.    NIHSS components Score: Comment  1a Level of Conscious 0[x]  1[]  2[]  3[]      1b LOC Questions 0[x]  1[]  2[]       1c LOC Commands 0[x]  1[]  2[]       2 Best Gaze 0[x]  1[]  2[]       3 Visual 0[x]  1[]  2[]  3[]      4 Facial Palsy 0[x]  1[]  2[]  3[]      5a Motor Arm - left 0[x]  1[]  2[]  3[]  4[]  UN[]    5b Motor Arm - Right 0[x]  1[]  2[]  3[]  4[]  UN[]    6a Motor Leg - Left 0[x]  1[]  2[]  3[]  4[]  UN[]    6b Motor Leg - Right 0[x]  1[]  2[]  3[]  4[]  UN[]    7 Limb Ataxia 0[x]  1[]  2[]  3[]  UN[]     8 Sensory 0[]  1[x]  2[]  UN[]      9 Best Language 0[x]  1[]  2[]  3[]      10 Dysarthria 0[x]  1[]  2[]  UN[]      11 Extinct. and Inattention 0[x]  1[]  2[]       TOTAL: 1      ROS  Comprehensive ROS performed and pertinent positives documented in HPI   Past History   Past Medical History:  Diagnosis Date   Abnormal Pap smear of  cervix    yrs ago   Adenomyosis    Allergy    Anemia    Anxiety    Aortic atherosclerosis (HCC)    Arthritis    Asthma    Chronic UTI    Clostridium difficile infection    Depression    Diabetes mellitus without complication (HCC) 10/22/2023   A1C 8.8   Diverticulosis    Endometriosis    Fibroid    Fibromyalgia    GERD (gastroesophageal reflux disease)    Hiatal hernia    Hyperlipidemia    Hypertension    Internal hemorrhoids    Meningitis, viral    Migraines    Pancreatitis    Pure hypercholesterolemia 12/23/2020   Tubular adenoma of colon     Past Surgical History:  Procedure Laterality Date   APPENDECTOMY  1990   CERVICAL BIOPSY  W/ LOOP ELECTRODE EXCISION     LEEP   CESAREAN SECTION  2002   CHOLECYSTECTOMY  2011   COLONOSCOPY     UPPER GASTROINTESTINAL ENDOSCOPY     URINARY SURGERY  urethra sling, removal, and then revision 2016 (4 surgeries)   WISDOM TOOTH EXTRACTION      Family History: Family History  Problem Relation Age of Onset   Cancer Mother        cervical, breast, lung, skin, bladder-ex smoker   Hypertension Mother    Diabetes Father    Heart attack Maternal Grandfather    Multiple sclerosis Paternal Grandmother     Social History  reports that she has been smoking cigarettes. She has a 2 pack-year smoking history. She has never used smokeless tobacco. She reports current alcohol use. She reports current drug use. Drug: Marijuana.  Allergies  Allergen Reactions   Amlodipine     Leg pain, swelling around eyes    Atorvastatin Other (See Comments)     Joint pain   Crestor [Rosuvastatin]     Joint pain    Irbesartan     Leg pain, swelling around eyes   Metronidazole Nausea And Vomiting   Penicillins     childhood   Xanax [Alprazolam]     "Caused upper respiratory symptoms" per pt   Glyburide Other (See Comments)    "blood sugar dropped uncontrollably"   Linagliptin Diarrhea and Other (See Comments)    Stomach pain, sinus  infection   Moxifloxacin Diarrhea    Medications  No current facility-administered medications for this encounter.  Current Outpatient Medications:    albuterol (PROAIR HFA) 108 (90 Base) MCG/ACT inhaler, Inhale 2 puffs into the lungs every 4 (four) hours as needed for wheezing or shortness of breath., Disp: 3 Inhaler, Rfl: 0   hydrOXYzine (ATARAX) 50 MG tablet, Take 1 tablet (50 mg total) by mouth 3 (three) times daily as needed. (Patient taking differently: Take 50 mg by mouth at bedtime as needed (for sleep).), Disp: 30 tablet, Rfl: 5   ibuprofen (ADVIL) 800 MG tablet, TAKE 1 TABLET BY MOUTH EVERY 12  HOURS AS NEEDED, Disp: 90 tablet, Rfl: 1   insulin glargine (LANTUS SOLOSTAR) 100 UNIT/ML Solostar Pen, Inject 25 Units into the skin daily. Increase by 2 units to a maximum daily dose of 30 units every 4th day until blood sugars are at goal, Disp: 15 mL, Rfl: 6   metFORMIN (GLUCOPHAGE) 500 MG tablet, Take 2 tablets (1,000 mg total) by mouth 2 (two) times daily with a meal., Disp: 120 tablet, Rfl: 4   methocarbamol (ROBAXIN) 750 MG tablet, Take 1 tablet (750 mg total) by mouth every 8 (eight) hours as needed for muscle spasms., Disp: 90 tablet, Rfl: 3   norethindrone (AYGESTIN) 5 MG tablet, TAKE 1 TABLET BY MOUTH THREE TIMES A DAY, Disp: 252 tablet, Rfl: 2   omeprazole (PRILOSEC) 40 MG capsule, TAKE 1 CAPSULE BY MOUTH EVERY  MORNING, Disp: 100 capsule, Rfl: 0   pregabalin (LYRICA) 100 MG capsule, Take 1 capsule (100 mg total) by mouth 2 (two) times daily., Disp: 60 capsule, Rfl: 5   saccharomyces boulardii (FLORASTOR) 250 MG capsule, Take 1 capsule (250 mg total) by mouth 2 (two) times daily., Disp: 60 capsule, Rfl: 0   Semaglutide,0.25 or 0.5MG /DOS, 2 MG/3ML SOPN, Inject 0.25 mg into the skin once a week., Disp: 3 mL, Rfl: 1   spironolactone (ALDACTONE) 50 MG tablet, Take 1 tablet (50 mg total) by mouth daily., Disp: 90 tablet, Rfl: 3   valACYclovir (VALTREX) 500 MG tablet, Take 1 tablet (500 mg  total) by mouth 2 (two) times daily. For herpes prophylaxis, Disp: 90 tablet, Rfl: 1   Blood Glucose Monitoring  Suppl (ACCU-CHEK AVIVA PLUS) w/Device KIT, USE AS DIRECTED DAILY. E11.9, Disp: 1 kit, Rfl: 0   buPROPion (WELLBUTRIN XL) 150 MG 24 hr tablet, Take 1 tablet (150 mg total) by mouth daily. (Patient not taking: Reported on 01/08/2024), Disp: 90 tablet, Rfl: 1   Continuous Blood Gluc Receiver (FREESTYLE LIBRE 2 READER) DEVI, Use to check blood sugar three times daily. E11.49, Disp: 1 each, Rfl: 0   Continuous Glucose Sensor (FREESTYLE LIBRE 2 SENSOR) MISC, USE TO CHECK BLOOD SUGAR 3 TIMES A DAY - CHANGE SENSOR EVERY 14 DAYS, Disp: 2 each, Rfl: 41   glucose blood (FREESTYLE TEST STRIPS) test strip, Use to check blood sugar three times daily. E11.49, Disp: 100 each, Rfl: 3   Insulin Pen Needle 31G X 5 MM MISC, 1 each by Does not apply route at bedtime., Disp: 30 each, Rfl: 5   Lactic Ac-Citric Ac-Pot Bitart (PHEXXI) 1.8-1-0.4 % GEL, Place 1 Applicatorful vaginally as directed. Place in vagina one hour prior to intercourse, Disp: 5 g, Rfl: 12   Lancet Devices (ACCU-CHEK SOFTCLIX) lancets, Use as instructed daily., Disp: 1 each, Rfl: 5   Lancets (FREESTYLE) lancets, Use to check blood sugar three times daily. E11.49, Disp: 100 each, Rfl: 3   LUPRON DEPOT, 3-MONTH, 11.25 MG injection, Inject 11.25 mg into the muscle every 3 (three) months., Disp: , Rfl:    Prasterone (INTRAROSA) 6.5 MG INST, Place 1 suppository vaginally at bedtime as needed., Disp: 30 each, Rfl: 12  Vitals   Vitals:   01/08/24 0108 01/08/24 0445 01/08/24 0507 01/08/24 0509  BP: (!) 148/98 (!) 137/97    Pulse: 76 70 66   Resp: 16 15 13    Temp: 98.8 F (37.1 C)   98 F (36.7 C)  TempSrc: Oral   Axillary  SpO2: 95% 94% 96%   Weight:      Height:        Body mass index is 28.42 kg/m.  Physical Exam   General: Laying comfortably in bed; in no acute distress.  HENT: Normal oropharynx and mucosa. Normal external  appearance of ears and nose.  Neck: Supple, no pain or tenderness  CV: No JVD. No peripheral edema.  Pulmonary: Symmetric Chest rise. Normal respiratory effort.  Abdomen: Soft to touch, non-tender.  Ext: No cyanosis, edema, or deformity  Skin: No rash. Normal palpation of skin.   Musculoskeletal: Normal digits and nails by inspection. No clubbing.   Neurologic Examination  Mental status/Cognition: Alert, oriented to self, place, month and year, good attention.  Speech/language: Fluent, comprehension intact, object naming intact, repetition intact.  Cranial nerves:   CN II Pupils equal and reactive to light, no VF deficits    CN III,IV,VI EOM intact, no gaze preference or deviation, no nystagmus    CN V Slight numbness in right lower face to touch.   CN VII no asymmetry, no nasolabial fold flattening    CN VIII normal hearing to speech    CN IX & X normal palatal elevation, no uvular deviation    CN XI 5/5 head turn and 5/5 shoulder shrug bilaterally    CN XII midline tongue protrusion    Motor:  Muscle bulk: Intact bilaterally, tone intact bilaterally, pronator drift are normal tremor are normal Mvmt Root Nerve  Muscle Right Left Comments  SA C5/6 Ax Deltoid 5 5   EF C5/6 Mc Biceps 5 5   EE C6/7/8 Rad Triceps 5 5   WF C6/7 Med FCR     WE  C7/8 PIN ECU     F Ab C8/T1 U ADM/FDI 5 5   HF L1/2/3 Fem Illopsoas 4+ 5   KE L2/3/4 Fem Quad 5 5   DF L4/5 D Peron Tib Ant 5 5   PF S1/2 Tibial Grc/Sol 5 5    Sensation:  Light touch Slightly decreased to light touch in right lower face and right upper extremity and right lower extremity.   Pin prick    Temperature    Vibration   Proprioception    Coordination/Complex Motor:  - Finger to Nose intact bilaterally - Heel to shin intact bilaterally - Rapid alternating movement are normal - Gait: Deferred.  Labs/Imaging/Neurodiagnostic studies   CBC:  Recent Labs  Lab Jan 09, 2024 2145 January 09, 2024 2150  WBC 12.5*  --   NEUTROABS 8.7*  --    HGB 14.5 14.6  HCT 42.7 43.0  MCV 94.7  --   PLT 346  --    Basic Metabolic Panel:  Lab Results  Component Value Date   NA 140 09-Jan-2024   K 4.1 2024-01-09   CO2 22 2024-01-09   GLUCOSE 110 (H) 2024/01/09   BUN 20 January 09, 2024   CREATININE 1.10 (H) 01-09-24   CALCIUM 9.2 Jan 09, 2024   GFRNONAA >60 09-Jan-2024   GFRAA 127 12/12/2020   Lipid Panel:  Lab Results  Component Value Date   LDLCALC 184 (H) 11/22/2023   HgbA1c:  Lab Results  Component Value Date   HGBA1C 7.7 (H) 11/22/2023   Urine Drug Screen:     Component Value Date/Time   LABOPIA NONE DETECTED 01-09-2024 2131   COCAINSCRNUR NONE DETECTED 01/09/24 2131   LABBENZ NONE DETECTED 01-09-2024 2131   AMPHETMU NONE DETECTED 2024/01/09 2131   THCU POSITIVE (A) January 09, 2024 2131   LABBARB NONE DETECTED 01-09-2024 2131    Alcohol Level     Component Value Date/Time   ETH <10 01-09-24 2145   INR  Lab Results  Component Value Date   INR 1.0 2024/01/09   APTT  Lab Results  Component Value Date   APTT 26 01/09/2024   AED levels: No results found for: "PHENYTOIN", "ZONISAMIDE", "LAMOTRIGINE", "LEVETIRACETA"  CT Head without contrast(Personally reviewed): CTH was negative for a large hypodensity concerning for a large territory infarct or hyperdensity concerning for an ICH  CT angio Head and Neck with contrast: Pending  MRI Brain(Personally reviewed): Acute lacunar infarct left thalamus.  ASSESSMENT   Christina Gintz is a 48 y.o. female with hx of DM2, GERD, HTN, HLD, who presents with R sided numbness and found to have L thalamic stroke.  I suspect that the etiology of her stroke is likely small vessel disease.  She does have significant risk factors including diabetes, hypertension, hyperlipidemia and current smoker.  RECOMMENDATIONS  Recommend that primary team order following: - Frequent Neuro checks per stroke unit protocol - Recommend Vascular imaging with CTA head and neck - Recommend  obtaining TTE - Recommend obtaining Lipid panel with LDL - Please start statin if LDL > 70 - Recommend HbA1c to evaluate for diabetes and how well it is controlled. - Antithrombotic -aspirin 81 mg daily, Plavix 75 mg daily for 21 days, followed by aspirin 81 mg daily alone. - Recommend DVT ppx - SBP goal - permissive hypertension first 24 h < 220/110. Held home meds.  - Recommend Telemetry monitoring for arrythmia - Recommend bedside swallow screen prior to PO intake. - Stroke education booklet - Recommend PT/OT/SLP consult - I counseled her extensively on the importance of  quitting smoking to reduce risk of stroke in the future. ______________________________________________________________________    Welton Flakes, MD [facility]

## 2024-01-08 NOTE — Hospital Course (Signed)
 48 year old female history of insulin dependent DM type II, essential hypertension, hyperlipidemia, headache, hidradenitis suppurativa, chronic smoking and fibromyalgia presented to emergency department complaining about numbness of the right side of the body and weakness.  Patient reported all the symptoms started abruptly on Thursday 9.  Patient reported she has been feeling woozy/lightheaded and then developed tingling sensation of the right side of the body and weakness.  At presentation to ED hemodynamically stable except borderline elevated blood pressure 150/98. CMP unremarkable.  CBC showing leukocytosis 12.5. Blood alcohol level < 10. Negative pregnancy test. UDS positive for THC.  MRI of the brain showing acute infraction of the left thalamus. CT head normal finding.  In the ED patient has been given 1 L of NS bolus, Compazine, Atarax and Tylenol.  Neurology has been consulted for acute stroke.   3/16: Mildly elevated blood pressure at 168/54.  Symptoms improved.CTA head & neck no emergent vascular finding.  Premature atherosclerosis for age.  A1c of 6.4, lipid panel with total cholesterol elevated at 231, triglyceride 165, HDL 23 and LDL 175.  Apparently patient has intolerance to statin and Repatha.  Neurology discussed with her regarding Leqvio as she need medications for premature atherosclerosis.  Leukocytosis improving. Patient should take aspirin and Plavix together for 3 weeks followed by aspirin only.  Echocardiogram with normal EF and grade 2 diastolic dysfunction.  No other significant abnormality.  PT is recommending outpatient physical therapy.  Patient continued to have mild tingling of right upper extremity with normal strength.  Case was discussed with neurology and she is being discharged on DAPT with aspirin and Plavix and outpatient follow-up with primary care provider and neurology and they will try arranging [facility] as outpatient.  Patient was counseled for risk  reduction, should stop smoking, decreased alcohol intake, have a better control of diabetes and hypertension.  She will continue the rest of her home medications.  Initial home antihypertensives were held due to permissive hypertension and she can resume on discharge.  Patient need to follow-up closely with primary care provider and neurology.

## 2024-01-08 NOTE — ED Notes (Signed)
 Patient transported to MRI

## 2024-01-08 NOTE — ED Notes (Signed)
 Pt admitted to [facility]

## 2024-01-08 NOTE — ED Notes (Addendum)
 Patient returned from MRI.

## 2024-01-08 NOTE — ED Provider Notes (Signed)
 [place] EMERGENCY DEPARTMENT AT [facility] Provider Note   CSN: [id number] Arrival date & time: 01/07/24  2113     History  Chief Complaint  Patient presents with   numbness  rt body    Amber Rose is a 48 y.o. female.  This is a 48 year old female presenting emergency department for right-sided numbness and weakness.  Symptoms started Thursday night around midnight abruptly.  She report initially feeling woozy/lightheaded and then had tingling sensation to the entire right side of body.  She notes that her right upper extremity and right lower extremity feels somewhat weaker than her left.  Feels somewhat off balance.  Had a headache yesterday, none currently.  No aphasia, facial droop, vision changes.  Denies chest pain, palpitations.  No shortness of breath.        Home Medications Prior to Admission medications   Medication Sig Start Date End Date Taking? Authorizing Provider  albuterol (PROAIR HFA) 108 (90 Base) MCG/ACT inhaler Inhale 2 puffs into the lungs every 4 (four) hours as needed for wheezing or shortness of breath. 04/05/18  Yes Hoy Register, MD  hydrOXYzine (ATARAX) 50 MG tablet Take 1 tablet (50 mg total) by mouth 3 (three) times daily as needed. Patient taking differently: Take 50 mg by mouth at bedtime as needed (for sleep). 09/21/23  Yes Alfonse Spruce, MD  ibuprofen (ADVIL) 800 MG tablet TAKE 1 TABLET BY MOUTH EVERY 12  HOURS AS NEEDED 09/21/23  Yes Newlin, Enobong, MD  insulin glargine (LANTUS SOLOSTAR) 100 UNIT/ML Solostar Pen Inject 25 Units into the skin daily. Increase by 2 units to a maximum daily dose of 30 units every 4th day until blood sugars are at goal 09/20/23  Yes Hoy Register, MD  metFORMIN (GLUCOPHAGE) 500 MG tablet Take 2 tablets (1,000 mg total) by mouth 2 (two) times daily with a meal. 11/25/23  Yes Motwani, Komal, MD  methocarbamol (ROBAXIN) 750 MG tablet Take 1 tablet (750 mg total) by mouth every 8 (eight)  hours as needed for muscle spasms. 02/24/22  Yes Hoy Register, MD  norethindrone (AYGESTIN) 5 MG tablet TAKE 1 TABLET BY MOUTH THREE TIMES A DAY 12/20/23  Yes Jerene Bears, MD  omeprazole (PRILOSEC) 40 MG capsule TAKE 1 CAPSULE BY MOUTH EVERY  MORNING 08/09/23  Yes Hoy Register, MD  pregabalin (LYRICA) 100 MG capsule Take 1 capsule (100 mg total) by mouth 2 (two) times daily. 01/06/24  Yes Hoy Register, MD  saccharomyces boulardii (FLORASTOR) 250 MG capsule Take 1 capsule (250 mg total) by mouth 2 (two) times daily. 05/12/22  Yes Unk Lightning, PA  Semaglutide,0.25 or 0.5MG /DOS, 2 MG/3ML SOPN Inject 0.25 mg into the skin once a week. 11/25/23  Yes Motwani, Carin Hock, MD  spironolactone (ALDACTONE) 50 MG tablet Take 1 tablet (50 mg total) by mouth daily. 09/16/23  Yes Chilton Si, MD  valACYclovir (VALTREX) 500 MG tablet Take 1 tablet (500 mg total) by mouth 2 (two) times daily. For herpes prophylaxis 01/06/24  Yes Hoy Register, MD  Blood Glucose Monitoring Suppl (ACCU-CHEK AVIVA PLUS) w/Device KIT USE AS DIRECTED DAILY. E11.9 09/13/17   Hoy Register, MD  buPROPion (WELLBUTRIN XL) 150 MG 24 hr tablet Take 1 tablet (150 mg total) by mouth daily. Patient not taking: Reported on 01/08/2024 01/06/24   Hoy Register, MD  Continuous Blood Gluc Receiver (FREESTYLE LIBRE 2 READER) DEVI Use to check blood sugar three times daily. E11.49 11/06/22   Hoy Register, MD  Continuous Glucose Sensor (FREESTYLE  LIBRE 2 SENSOR) MISC USE TO CHECK BLOOD SUGAR 3 TIMES A DAY - CHANGE SENSOR EVERY 14 DAYS 12/10/23   Hoy Register, MD  glucose blood (FREESTYLE TEST STRIPS) test strip Use to check blood sugar three times daily. E11.49 02/16/23   Hoy Register, MD  Insulin Pen Needle 31G X 5 MM MISC 1 each by Does not apply route at bedtime. 08/25/23   Hoy Register, MD  Lactic Ac-Citric Ac-Pot Bitart (PHEXXI) 1.8-1-0.4 % GEL Place 1 Applicatorful vaginally as directed. Place in vagina one hour prior to  intercourse 11/18/23   Earley Favor, MD  Lancet Devices [facility]) lancets Use as instructed daily. 05/14/17   Hoy Register, MD  Lancets (FREESTYLE) lancets Use to check blood sugar three times daily. E11.49 02/16/23   Hoy Register, MD  LUPRON DEPOT, 41-MONTH, 11.25 MG injection Inject 11.25 mg into the muscle every 3 (three) months. 12/23/23   [provider]  Prasterone (INTRAROSA) 6.5 MG INST Place 1 suppository vaginally at bedtime as needed. 10/13/23   Earley Favor, MD      Allergies    Amlodipine, Atorvastatin, Crestor [rosuvastatin], Irbesartan, Metronidazole, Penicillins, Xanax [alprazolam], Glyburide, Linagliptin, and Moxifloxacin    Review of Systems   Review of Systems  Physical Exam Updated Vital Signs BP (!) 137/97   Pulse 66   Temp 98 F (36.7 C) (Axillary)   Resp 13   Ht 5\' 4"  (1.626 m)   Wt 75.1 kg   SpO2 96%   BMI 28.42 kg/m  Physical Exam Vitals and nursing note reviewed.  Constitutional:      General: She is not in acute distress.    Appearance: She is not toxic-appearing.  HENT:     Head: Normocephalic.     Nose: Nose normal.  Eyes:     Conjunctiva/sclera: Conjunctivae normal.  Cardiovascular:     Rate and Rhythm: Normal rate.  Pulmonary:     Effort: Pulmonary effort is normal.     Breath sounds: Normal breath sounds.  Abdominal:     General: Abdomen is flat. There is no distension.     Tenderness: There is no abdominal tenderness. There is no guarding or rebound.  Skin:    General: Skin is warm.  Neurological:     Mental Status: She is alert and oriented to person, place, and time.     Comments: No cranial nerve deficits.  Subjective paresthesia to her right upper and right lower extremity.  Right lower extremity with minor weakness as compared to the left.  Coordinated movements.  Psychiatric:        Mood and Affect: Mood normal.        Behavior: Behavior normal.     ED Results / Procedures / Treatments    Labs (all labs ordered are listed, but only abnormal results are displayed) Labs Reviewed  CBC - Abnormal; Notable for the following components:      Result Value   WBC 12.5 (*)    All other components within normal limits  DIFFERENTIAL - Abnormal; Notable for the following components:   Neutro Abs 8.7 (*)    All other components within normal limits  COMPREHENSIVE METABOLIC PANEL - Abnormal; Notable for the following components:   Glucose, Bld 117 (*)    Total Protein 6.3 (*)    Albumin 3.2 (*)    Alkaline Phosphatase 36 (*)    All other components within normal limits  RAPID URINE DRUG SCREEN, HOSP PERFORMED - Abnormal; Notable for  the following components:   Tetrahydrocannabinol POSITIVE (*)    All other components within normal limits  I-STAT CHEM 8, ED - Abnormal; Notable for the following components:   Creatinine, Ser 1.10 (*)    Glucose, Bld 110 (*)    Calcium, Ion 1.12 (*)    All other components within normal limits  CBG MONITORING, ED - Abnormal; Notable for the following components:   Glucose-Capillary 173 (*)    All other components within normal limits  ETHANOL  PROTIME-INR  APTT  URINALYSIS, ROUTINE W REFLEX MICROSCOPIC  PREGNANCY, URINE    EKG EKG Interpretation Date/Time:  Friday January 07 2024 21:33:45 EDT Ventricular Rate:  72 PR Interval:  150 QRS Duration:  78 QT Interval:  346 QTC Calculation: 378 R Axis:   84  Text Interpretation: Normal sinus rhythm Normal ECG When compared with ECG of 05-Aug-2023 09:13, PREVIOUS ECG IS PRESENT Confirmed by Estanislado Pandy [phone]) on 01/08/2024 1:39:02 AM  Radiology MR BRAIN WO CONTRAST Result Date: 01/08/2024 CLINICAL DATA:  Neuro deficit with acute stroke suspected EXAM: MRI HEAD WITHOUT CONTRAST TECHNIQUE: Multiplanar, multiecho pulse sequences of the brain and surrounding structures were obtained without intravenous contrast. COMPARISON:  Head CT from yesterday.  Brain MRI 07/19/2017 FINDINGS: Brain: Acute  lacunar infarct in the posterior left thalamus. Minor FLAIR hyperintensity in the cerebral white matter, mainly periventricular. No pre-existing infarct seen. No hemorrhage, hydrocephalus, masslike finding, or collection. Vascular: Normal flow voids. Skull and upper cervical spine: Normal marrow signal. Sinuses/Orbits: Negative. IMPRESSION: Acute lacunar infarct in the left thalamus. Electronically Signed   By: Tiburcio Pea M.D.   On: 01/08/2024 04:13   CT HEAD WO CONTRAST Result Date: 01/07/2024 CLINICAL DATA:  Acute neurologic deficit EXAM: CT HEAD WITHOUT CONTRAST TECHNIQUE: Contiguous axial images were obtained from the base of the skull through the vertex without intravenous contrast. RADIATION DOSE REDUCTION: This exam was performed according to the departmental dose-optimization program which includes automated exposure control, adjustment of the mA and/or kV according to patient size and/or use of iterative reconstruction technique. COMPARISON:  None Available. FINDINGS: Brain: No mass,hemorrhage or extra-axial collection. Normal appearance of the parenchyma and CSF spaces. Vascular: No hyperdense vessel or unexpected vascular calcification. Skull: The visualized skull base, calvarium and extracranial soft tissues are normal. Sinuses/Orbits: No fluid levels or advanced mucosal thickening of the visualized paranasal sinuses. No mastoid or middle ear effusion. Normal orbits. Other: None. IMPRESSION: Normal head CT. Electronically Signed   By: Deatra Robinson M.D.   On: 01/07/2024 22:54    Procedures Procedures    Medications Ordered in ED Medications  prochlorperazine (COMPAZINE) injection 10 mg (10 mg Intravenous Given 01/08/24 0228)  acetaminophen (TYLENOL) tablet 1,000 mg (1,000 mg Oral Given 01/08/24 0227)  sodium chloride 0.9 % bolus 1,000 mL (0 mLs Intravenous Stopped 01/08/24 0507)  hydrOXYzine (ATARAX) tablet 25 mg (25 mg Oral Given 01/08/24 0258)    ED Course/ Medical Decision Making/  A&P Clinical Course as of 01/08/24 0517  Sat Jan 08, 2024  0417 MR BRAIN WO CONTRAST IMPRESSION: Acute lacunar infarct in the left thalamus.   [TY]  [id number] Will admit patient for stroke workup.  Neurology consulted. [TY]    Clinical Course User Index [TY] Coral Spikes, DO                                 Medical Decision Making This is a 48 year old female presenting emergency  department with right sided paresthesias and right lower extremity weakness.  She is afebrile, nontachycardic, slightly hypertensive.  She is outside of the window for acute stroke intervention.  CT head independently reviewed with no obvious pathology.  Labs with minor leukocytosis, moving neck freely without signs of meningitis.  No significant metabolic derangements.  She is hyperglycemic, but does not appear to be in DKA.  UA positive for THC, unlikely cause of symptoms.  Pregnancy test is negative.  UA without evidence of urinary tract infection.  EKG on my depend interpretation appears to be sinus rhythm.  Given physical exam with subjective paresthesias and some minor right lower extremity weakness will pursue MRI to evaluate for stroke.  Will also treat with migraine cocktail.  See ED course for further MDM disposition.  Amount and/or Complexity of Data Reviewed External Data Reviewed:     Details: Per chart review history of diabetes, hypertension, fibromyalgia, pancreatitis, hyperlipidemia, migraines Radiology: ordered. Decision-making details documented in ED Course.  Risk OTC drugs. Prescription drug management. Decision regarding hospitalization.         Final Clinical Impression(s) / ED Diagnoses Final diagnoses:  Lacunar stroke [facility])    Rx / DC Orders ED Discharge Orders     None         Coral Spikes, DO 01/08/24 0454

## 2024-01-08 NOTE — Care Management Obs Status (Addendum)
 MEDICARE OBSERVATION STATUS NOTIFICATION   Patient Details  Name: Amber Rose MRN: [id number] Date of Birth: Mar 07, 1976   Medicare Observation Status Notification Given:   Yes    Lockie Pares, RN 01/08/2024, 4:46 PM

## 2024-01-08 NOTE — Progress Notes (Addendum)
 STROKE TEAM PROGRESS NOTE   SIGNIFICANT HOSPITAL EVENTS Discussed with patient regarding risk factor control MRI brain acute lacunar infarct in the left thalamus CTA head and neck no emergent finding, premature atherosclerosis for age.   INTERIM HISTORY/SUBJECTIVE No acute overnight events. VSS. Neuro exam with some subjective improvement in sensory deficit of right leg and arm and improvement in right-sided weakness overnight.  Patient reports a history of significant intolerance to statins and Repatha. Discussed Leqvio as an alternative for cholesterol management.   OBJECTIVE CBC    Component Value Date/Time   WBC 12.5 (H) 01/07/2024 2145   RBC 4.51 01/07/2024 2145   HGB 14.6 01/07/2024 2150   HGB 13.9 06/02/2023 1117   HGB 10.8 (L) 02/05/2023 1147   HCT 43.0 01/07/2024 2150   HCT 33.9 (L) 02/05/2023 1147   PLT 346 01/07/2024 2145   PLT 279 06/02/2023 1117   PLT 312 02/05/2023 1147   MCV 94.7 01/07/2024 2145   MCV 89 02/05/2023 1147   MCH 32.2 01/07/2024 2145   MCHC 34.0 01/07/2024 2145   RDW 13.9 01/07/2024 2145   RDW 14.8 02/05/2023 1147   LYMPHSABS 2.9 01/07/2024 2145   LYMPHSABS 2.0 02/05/2023 1147   MONOABS 0.7 01/07/2024 2145   EOSABS 0.1 01/07/2024 2145   EOSABS 0.0 02/05/2023 1147   BASOSABS 0.1 01/07/2024 2145   BASOSABS 0.0 02/05/2023 1147   BMET    Component Value Date/Time   NA 140 01/07/2024 2150   NA 139 09/16/2023 1035   K 4.1 01/07/2024 2150   CL 107 01/07/2024 2150   CO2 22 01/07/2024 2145   GLUCOSE 110 (H) 01/07/2024 2150   BUN 20 01/07/2024 2150   BUN 19 09/16/2023 1035   CREATININE 1.10 (H) 01/07/2024 2150   CREATININE 0.70 11/22/2023 0950   CALCIUM 9.2 01/07/2024 2145   EGFR 78 09/16/2023 1035   GFRNONAA >60 01/07/2024 2145   GFRNONAA >60 08/30/2023 1052   GFRNONAA >89 10/28/2016 0851   Lab Results  Component Value Date   HGBA1C 6.4 (H) 01/08/2024   Lab Results  Component Value Date   CHOL 231 (H) 01/08/2024   HDL 23 (L)  01/08/2024   LDLCALC 175 (H) 01/08/2024   LDLDIRECT 51 08/14/2022   TRIG 165 (H) 01/08/2024   CHOLHDL 10.0 01/08/2024   IMAGING past 24 hours MR BRAIN WO CONTRAST Result Date: 01/08/2024 CLINICAL DATA:  Neuro deficit with acute stroke suspected EXAM: MRI HEAD WITHOUT CONTRAST TECHNIQUE: Multiplanar, multiecho pulse sequences of the brain and surrounding structures were obtained without intravenous contrast. COMPARISON:  Head CT from yesterday.  Brain MRI 07/19/2017 FINDINGS: Brain: Acute lacunar infarct in the posterior left thalamus. Minor FLAIR hyperintensity in the cerebral white matter, mainly periventricular. No pre-existing infarct seen. No hemorrhage, hydrocephalus, masslike finding, or collection. Vascular: Normal flow voids. Skull and upper cervical spine: Normal marrow signal. Sinuses/Orbits: Negative. IMPRESSION: Acute lacunar infarct in the left thalamus. Electronically Signed   By: Tiburcio Pea M.D.   On: 01/08/2024 04:13   CT HEAD WO CONTRAST Result Date: 01/07/2024 CLINICAL DATA:  Acute neurologic deficit EXAM: CT HEAD WITHOUT CONTRAST TECHNIQUE: Contiguous axial images were obtained from the base of the skull through the vertex without intravenous contrast. RADIATION DOSE REDUCTION: This exam was performed according to the departmental dose-optimization program which includes automated exposure control, adjustment of the mA and/or kV according to patient size and/or use of iterative reconstruction technique. COMPARISON:  None Available. FINDINGS: Brain: No mass,hemorrhage or extra-axial collection. Normal appearance of  the parenchyma and CSF spaces. Vascular: No hyperdense vessel or unexpected vascular calcification. Skull: The visualized skull base, calvarium and extracranial soft tissues are normal. Sinuses/Orbits: No fluid levels or advanced mucosal thickening of the visualized paranasal sinuses. No mastoid or middle ear effusion. Normal orbits. Other: None. IMPRESSION: Normal head  CT. Electronically Signed   By: Deatra Robinson M.D.   On: 01/07/2024 22:54   Vitals:   01/08/24 0507 01/08/24 0509 01/08/24 0515 01/08/24 0729  BP:    (!) 185/96  Pulse: 66  82 61  Resp: 13  19 10   Temp:  98 F (36.7 C)  98 F (36.7 C)  TempSrc:  Axillary  Oral  SpO2: 96%  97% 98%  Weight:      Height:       PHYSICAL EXAM General:  Alert, well-nourished, well-developed patient in no acute distress Psych:  Mood and affect appropriate for situation, calm and cooperative with exam CV: Regular rate and rhythm on monitor Respiratory:  Regular, unlabored respirations on room air GI: Abdomen soft and nontender  NEURO:  Mental Status: AA&Ox3, patient is able to give clear and coherent history Speech/Language: speech is without dysarthria or aphasia.  Naming, repetition, fluency, and comprehension intact.  Cranial Nerves:  II: PERRL. Visual fields full.  III, IV, VI: EOMI. Eyelids elevate symmetrically.  V: Decreased sensation to the right face with minimal to no improvement since onset VII: Face is symmetric resting and with movement VIII: Hearing is intact to voice. IX, X: Palate elevates symmetrically. Phonation is normal.  XI: Shoulder shrug 5/5. XII: Tongue protrudes midline Motor: 5/5 strength to left upper and lower extremity, right upper extremity, right lower extremity with subtle weakness and wobbling with sustained elevation without significant vertical drift. Tone: is normal and bulk is normal Sensation: Intact and symmetric to light touch in bilateral lower extremities, slightly decreased light touch to the right upper extremity compared to the left, significant decreased to the right face compared to the left. Coordination: FTN intact bilaterally, HKS: no ataxia in BLE out of proportion to minimal right lower extremity weakness .No drift.  Gait: Deferred  ASSESSMENT/PLAN [name] is a 48 y.o. female with history of DM2, GERD, HTN, HLD, and current tobacco  smoking presenting with right face, arm, and leg numbness with MRI brain findings significant for a left thalamic stroke.   Stroke:  acute lacunar infarct in the left thalamus, etiology:  small vessel disease in patient with multiple uncontrolled stroke risk factors CT head normal head CT CTA head & neck no emergent vascular finding.  Premature atherosclerosis for age.  MRI  acute lacunar infarct in the left thalamus 2D Echo pending  LDL 175 HgbA1c 6.4 UDS positive for THC VTE prophylaxis - subcu Lovenox No antithrombotics prior to admission, now on aspirin 81 mg daily for 3 weeks and then aspirin alone. Therapy recommendations:  Pending Disposition:  pending   Hypertension Home meds:  spironolactone  Stable Long-term BP goal normotensive  Hyperlipidemia Home meds:  none. Patient reports significant intolerance to all statin medications as well as Repatha injections in the past.  LDL 184, goal < 70 Consider Leqvio if agreeable to patient. Pamphlets provided.  High intensity statin not indicated as patient is intolerant to statin medication Pt does not want to try zetia as it has similar side effect profile with statin  Diabetes type II Controlled Home meds: metformin, lantus, semaglutide  HgbA1c 6.4, goal < 7.0 CBGs SSI Recommend close follow-up with PCP  for better DM control  Tobacco Abuse Patient smokes 1 packs per day for many years       Ready to quit? Yes Nicotine replacement therapy provided  Substance Abuse Patient uses THC UDS positive for THC        Ready to quit? Yes TOC consult for cessation placed  Other Stroke Risk Factors Migraines  Other Active Problems Leukocytosis, resolved WBC 12.5 -> 10 Afebrile, UA unremarkable HSV Takes prophylactic Valtrex Anxiety/depression Takes atarax  GERD Takes home Protonix Menorrhagia Takes [place]  Overweight with BMI 28.42 Takes Ozempic  Diabetic neuropathy Takes home pregabalin  Hospital day #  0  Amber Rose, AGACNP-BC [facility] Pager: [phone]  ATTENDING NOTE: I reviewed above note and agree with the assessment and plan. Pt was seen and examined.   Best friend on the phone.  Patient still complaining of subtle numbness/tingling on the right arm and right face.  Right lower extremity much improved.  Strength symmetrical bilaterally.  Patient educated on aggressive stroke risk factor modification.  Continue DAPT for 3 weeks and then aspirin alone.  Still has high LDL, however patient had previous experience with statin and Repatha with severe side effect, hesitant to try any new medication including Zetia.  Given information for Leqvio.  Smoking and THC cessation education provided.  For detailed assessment and plan, please refer to above/below as I have made changes wherever appropriate.   Marvel Plan, MD PhD Stroke Neurology 01/08/2024 4:58 PM  I spent additional 30 inpatient minutes face-to-face time with the patient, more than 50% of which was spent in counseling and coordination of care, reviewing test results, images and medication, and discussing the diagnosis, treatment plan and potential prognosis. This patient's care requiresreview of multiple databases, neurological assessment, discussion with family, other specialists and medical decision making of high complexity.      To contact Stroke Continuity provider, please refer to [email or web address]. After hours, contact General Neurology

## 2024-01-08 NOTE — ED Notes (Signed)
 Patient request for her CBG to be checked. Cbg was 173.

## 2024-01-09 ENCOUNTER — Other Ambulatory Visit: Payer: Self-pay | Admitting: Neurology

## 2024-01-09 ENCOUNTER — Observation Stay (HOSPITAL_BASED_OUTPATIENT_CLINIC_OR_DEPARTMENT_OTHER)

## 2024-01-09 DIAGNOSIS — E785 Hyperlipidemia, unspecified: Secondary | ICD-10-CM | POA: Diagnosis not present

## 2024-01-09 DIAGNOSIS — Z720 Tobacco use: Secondary | ICD-10-CM | POA: Diagnosis not present

## 2024-01-09 DIAGNOSIS — I639 Cerebral infarction, unspecified: Secondary | ICD-10-CM

## 2024-01-09 DIAGNOSIS — I10 Essential (primary) hypertension: Secondary | ICD-10-CM | POA: Diagnosis not present

## 2024-01-09 DIAGNOSIS — I6389 Other cerebral infarction: Secondary | ICD-10-CM

## 2024-01-09 DIAGNOSIS — Z794 Long term (current) use of insulin: Secondary | ICD-10-CM | POA: Diagnosis not present

## 2024-01-09 DIAGNOSIS — D72829 Elevated white blood cell count, unspecified: Secondary | ICD-10-CM | POA: Diagnosis not present

## 2024-01-09 DIAGNOSIS — E1122 Type 2 diabetes mellitus with diabetic chronic kidney disease: Secondary | ICD-10-CM | POA: Diagnosis not present

## 2024-01-09 DIAGNOSIS — I6381 Other cerebral infarction due to occlusion or stenosis of small artery: Secondary | ICD-10-CM | POA: Diagnosis not present

## 2024-01-09 DIAGNOSIS — K219 Gastro-esophageal reflux disease without esophagitis: Secondary | ICD-10-CM | POA: Diagnosis not present

## 2024-01-09 DIAGNOSIS — I63332 Cerebral infarction due to thrombosis of left posterior cerebral artery: Secondary | ICD-10-CM | POA: Diagnosis not present

## 2024-01-09 LAB — ECHOCARDIOGRAM COMPLETE
AR max vel: 3.99 cm2
AV Peak grad: 6.1 mmHg
Ao pk vel: 1.23 m/s
Area-P 1/2: 3.42 cm2
Height: 64 in
S' Lateral: 2.5 cm
Weight: 2649.05 [oz_av]

## 2024-01-09 LAB — CBC
HCT: 41.4 % (ref 36.0–46.0)
Hemoglobin: 14.4 g/dL (ref 12.0–15.0)
MCH: 31.9 pg (ref 26.0–34.0)
MCHC: 34.8 g/dL (ref 30.0–36.0)
MCV: 91.6 fL (ref 80.0–100.0)
Platelets: 344 10*3/uL (ref 150–400)
RBC: 4.52 MIL/uL (ref 3.87–5.11)
RDW: 14.1 % (ref 11.5–15.5)
WBC: 11.4 10*3/uL — ABNORMAL HIGH (ref 4.0–10.5)
nRBC: 0 % (ref 0.0–0.2)

## 2024-01-09 LAB — BASIC METABOLIC PANEL
Anion gap: 7 (ref 5–15)
BUN: 11 mg/dL (ref 6–20)
CO2: 23 mmol/L (ref 22–32)
Calcium: 8.2 mg/dL — ABNORMAL LOW (ref 8.9–10.3)
Chloride: 109 mmol/L (ref 98–111)
Creatinine, Ser: 0.99 mg/dL (ref 0.44–1.00)
GFR, Estimated: >60 mL/min (ref >60)
Glucose, Bld: 129 mg/dL — ABNORMAL HIGH (ref 70–99)
Potassium: 3.7 mmol/L (ref 3.5–5.1)
Sodium: 139 mmol/L (ref 135–145)

## 2024-01-09 LAB — GLUCOSE, CAPILLARY
Glucose-Capillary: 121 mg/dL — ABNORMAL HIGH (ref 70–99)
Glucose-Capillary: 102 mg/dL — ABNORMAL HIGH (ref 70–99)

## 2024-01-09 MED ORDER — CLOPIDOGREL BISULFATE 75 MG PO TABS: 75.0000 mg | ORAL_TABLET | Freq: Every day | ORAL | 0 refills | Status: DC

## 2024-01-09 MED ORDER — NICOTINE 21 MG/24HR TD PT24: 21.0000 mg | MEDICATED_PATCH | Freq: Every day | TRANSDERMAL | 0 refills | Status: DC

## 2024-01-09 MED ORDER — ASPIRIN 81 MG PO TBEC: 81.0000 mg | DELAYED_RELEASE_TABLET | Freq: Every day | ORAL | 12 refills | Status: AC

## 2024-01-09 NOTE — Progress Notes (Signed)
 STROKE TEAM PROGRESS NOTE   INTERIM HISTORY/SUBJECTIVE No family at bedside.  Patient reclining in bed, stated right arm numbness seems slightly getting worse from yesterday.  Denies any other new symptoms.   OBJECTIVE CBC    Component Value Date/Time   WBC 11.4 (H) 01/09/2024 0530   RBC 4.52 01/09/2024 0530   HGB 14.4 01/09/2024 0530   HGB 13.9 06/02/2023 1117   HGB 10.8 (L) 02/05/2023 1147   HCT 41.4 01/09/2024 0530   HCT 33.9 (L) 02/05/2023 1147   PLT 344 01/09/2024 0530   PLT 279 06/02/2023 1117   PLT 312 02/05/2023 1147   MCV 91.6 01/09/2024 0530   MCV 89 02/05/2023 1147   MCH 31.9 01/09/2024 0530   MCHC 34.8 01/09/2024 0530   RDW 14.1 01/09/2024 0530   RDW 14.8 02/05/2023 1147   LYMPHSABS 2.9 01/07/2024 2145   LYMPHSABS 2.0 02/05/2023 1147   MONOABS 0.7 01/07/2024 2145   EOSABS 0.1 01/07/2024 2145   EOSABS 0.0 02/05/2023 1147   BASOSABS 0.1 01/07/2024 2145   BASOSABS 0.0 02/05/2023 1147   BMET    Component Value Date/Time   NA 139 01/09/2024 0530   NA 139 09/16/2023 1035   K 3.7 01/09/2024 0530   CL 109 01/09/2024 0530   CO2 23 01/09/2024 0530   GLUCOSE 129 (H) 01/09/2024 0530   BUN 11 01/09/2024 0530   BUN 19 09/16/2023 1035   CREATININE 0.99 01/09/2024 0530   CREATININE 0.70 11/22/2023 0950   CALCIUM 8.2 (L) 01/09/2024 0530   EGFR 78 09/16/2023 1035   GFRNONAA >60 01/09/2024 0530   GFRNONAA >60 08/30/2023 1052   GFRNONAA >89 10/28/2016 0851   Lab Results  Component Value Date   HGBA1C 6.4 (H) 01/08/2024   Lab Results  Component Value Date   CHOL 231 (H) 01/08/2024   HDL 23 (L) 01/08/2024   LDLCALC 175 (H) 01/08/2024   LDLDIRECT 51 08/14/2022   TRIG 165 (H) 01/08/2024   CHOLHDL 10.0 01/08/2024   IMAGING past 24 hours ECHOCARDIOGRAM COMPLETE Result Date: 01/09/2024    ECHOCARDIOGRAM REPORT   Patient Name:   Amber Rose Date of Exam: 01/09/2024 Medical Rec #:  [id number]          Height:       64.0 in Accession #:    [id number]          Weight:       165.6 lb Date of Birth:  03-May-1976          BSA:          1.805 m Patient Age:    48 years           BP:           168/54 mmHg Patient Gender: F                  HR:           70 bpm. Exam Location:  Inpatient Procedure: 2D Echo, 3D Echo, Color Doppler, Cardiac Doppler and Strain Analysis            (Both Spectral and Color Flow Doppler were utilized during            procedure). Indications:    Stroke  History:        Patient has no prior history of Echocardiogram examinations.                 Risk Factors:Diabetes, Hypertension, Dyslipidemia and Current  Smoker.  Sonographer:    Raeford Razor RDCS Referring Phys: Erick Blinks IMPRESSIONS  1. Left ventricular ejection fraction, by estimation, is 60 to 65%. The left ventricle has normal function. The left ventricle has no regional wall motion abnormalities. There is mild left ventricular hypertrophy. Left ventricular diastolic parameters are consistent with Grade II diastolic dysfunction (pseudonormalization).  2. Right ventricular systolic function is normal. The right ventricular size is normal. Tricuspid regurgitation signal is inadequate for assessing PA pressure.  3. No evidence of mitral valve regurgitation.  4. The aortic valve is tricuspid. Aortic valve regurgitation is not visualized.  5. The inferior vena cava is normal in size with greater than 50% respiratory variability, suggesting right atrial pressure of 3 mmHg. FINDINGS  Left Ventricle: Left ventricular ejection fraction, by estimation, is 60 to 65%. The left ventricle has normal function. The left ventricle has no regional wall motion abnormalities. Strain was performed and the global longitudinal strain is indeterminate. The left ventricular internal cavity size was normal in size. There is mild left ventricular hypertrophy. Left ventricular diastolic parameters are consistent with Grade II diastolic dysfunction (pseudonormalization). Right Ventricle: The right  ventricular size is normal. Right ventricular systolic function is normal. Tricuspid regurgitation signal is inadequate for assessing PA pressure. Left Atrium: Left atrial size was normal in size. Right Atrium: Right atrial size was normal in size. Pericardium: There is no evidence of pericardial effusion. Mitral Valve: No evidence of mitral valve regurgitation. Tricuspid Valve: Tricuspid valve regurgitation is not demonstrated. Aortic Valve: The aortic valve is tricuspid. Aortic valve regurgitation is not visualized. Aortic valve peak gradient measures 6.1 mmHg. Pulmonic Valve: Pulmonic valve regurgitation is not visualized. Aorta: The aortic root and ascending aorta are structurally normal, with no evidence of dilitation. Venous: The inferior vena cava is normal in size with greater than 50% respiratory variability, suggesting right atrial pressure of 3 mmHg. IAS/Shunts: No atrial level shunt detected by color flow Doppler.  LEFT VENTRICLE PLAX 2D LVIDd:         4.30 cm   Diastology LVIDs:         2.50 cm   LV e' medial:    12.70 cm/s LV PW:         0.80 cm   LV E/e' medial:  6.4 LV IVS:        0.80 cm   LV e' lateral:   15.20 cm/s LVOT diam:     2.30 cm   LV E/e' lateral: 5.3 LVOT Area:     4.15 cm                           3D Volume EF:                          3D EF:        56 %                          LV EDV:       158 ml                          LV ESV:       69 ml                          LV SV:  89 ml RIGHT VENTRICLE             IVC RV Basal diam:  3.30 cm     IVC diam: 1.30 cm RV Mid diam:    2.00 cm RV S prime:     21.10 cm/s TAPSE (M-mode): 2.8 cm LEFT ATRIUM             Index        RIGHT ATRIUM           Index LA diam:        2.50 cm 1.38 cm/m   RA Area:     13.70 cm LA Vol (A2C):   34.5 ml 19.11 ml/m  RA Volume:   31.40 ml  17.39 ml/m LA Vol (A4C):   32.2 ml 17.84 ml/m LA Biplane Vol: 34.0 ml 18.83 ml/m  AORTIC VALVE AV Area (Vmax): 3.99 cm AV Vmax:        123.00 cm/s AV Peak Grad:    6.1 mmHg LVOT Vmax:      118.00 cm/s  AORTA Ao Root diam: 2.90 cm Ao Asc diam:  3.00 cm MITRAL VALVE MV Area (PHT): 3.42 cm    SHUNTS MV Decel Time: 222 msec    Systemic Diam: 2.30 cm MV E velocity: 81.00 cm/s MV A velocity: 65.50 cm/s MV E/A ratio:  1.24 Photographer signed by Carolan Clines Signature Date/Time: 01/09/2024/12:18:54 PM    Final    Vitals:   01/08/24 2346 01/09/24 0400 01/09/24 0747 01/09/24 1200  BP: (!) 157/104 (!) 168/54 (!) 149/103 (!) 159/117  Pulse: 70 78 71 80  Resp: 18 16 18 20   Temp: 97.9 F (36.6 C) 98.2 F (36.8 C) 98.6 F (37 C) 99.8 F (37.7 C)  TempSrc: Oral Oral  Oral  SpO2: 100% 99% 96% 98%  Weight:      Height:       PHYSICAL EXAM General:  Alert, well-nourished, well-developed patient in no acute distress Psych:  Mood and affect appropriate for situation, calm and cooperative with exam CV: Regular rate and rhythm on monitor Respiratory:  Regular, unlabored respirations on room air GI: Abdomen soft and nontender  NEURO:  Mental Status: AA&Ox3, patient is able to give clear and coherent history Speech/Language: speech is without dysarthria or aphasia.  Naming, repetition, fluency, and comprehension intact.  Cranial Nerves:  II: PERRL. Visual fields full.  III, IV, VI: EOMI. Eyelids elevate symmetrically.  V: Decreased sensation to the right face with minimal to no improvement since onset VII: Face is symmetric resting and with movement VIII: Hearing is intact to voice. IX, X: Palate elevates symmetrically. Phonation is normal.  XI: Shoulder shrug 5/5. XII: Tongue protrudes midline Motor: 5/5 strength to left upper and lower extremity, right upper extremity, right lower extremity with subtle weakness and wobbling with sustained elevation without significant vertical drift. Tone: is normal and bulk is normal Sensation: Intact and symmetric to light touch in bilateral lower extremities, slightly decreased light touch to the right upper  extremity compared to the left, significant decreased to the right face compared to the left. Coordination: FTN intact bilaterally, HKS: no ataxia in BLE out of proportion to minimal right lower extremity weakness .No drift.  Gait: Deferred  ASSESSMENT/PLAN [name] is a 48 y.o. female with history of DM2, GERD, HTN, HLD, and current tobacco smoking presenting with right face, arm, and leg numbness with MRI brain findings significant for a left thalamic stroke.   Stroke:  acute  lacunar infarct in the left thalamus, etiology:  small vessel disease in patient with multiple uncontrolled stroke risk factors CT head normal head CT CTA head & neck no emergent vascular finding.  Premature atherosclerosis for age.  MRI  acute lacunar infarct in the left thalamus 2D Echo EF 60 to 65% LDL 175 HgbA1c 6.4 UDS positive for THC VTE prophylaxis - subcu Lovenox No antithrombotics prior to admission, now on aspirin 81 mg daily for 3 weeks and then aspirin alone. Therapy recommendations: Outpatient PT OT Disposition: Home today  Hypertension Home meds:  spironolactone  Stable Long-term BP goal normotensive  Hyperlipidemia Home meds:  none. Patient reports significant intolerance to all statin medications as well as Repatha injections in the past.  LDL 184, goal < 70 Consider Leqvio if agreeable to patient. Pamphlets provided.  patient is intolerant to statin and Repatha medication Pt does not want to try zetia as it has similar side effect profile with statin  Diabetes type II Controlled Home meds: metformin, lantus, semaglutide  HgbA1c 6.4, goal < 7.0 CBGs SSI Recommend close follow-up with PCP for better DM control  Tobacco Abuse Patient smokes 1 packs per day for many years       Ready to quit? Yes Nicotine replacement therapy provided  Substance Abuse Patient uses THC UDS positive for THC        Ready to quit? Yes TOC consult for cessation placed  Other Stroke Risk  Factors Migraines  Other Active Problems Leukocytosis, resolved WBC 12.5 -> 10 Afebrile, UA unremarkable HSV Takes prophylactic Valtrex Anxiety/depression Takes atarax  GERD Takes home Protonix Menorrhagia Takes [place]  Overweight with BMI 28.42 Takes Ozempic  Diabetic neuropathy Takes home pregabalin  Hospital day # 0  Neurology will sign off. Please call with questions. Pt will follow up with stroke clinic NP at [facility] in about 4 weeks. Thanks for the consult.  Marvel Plan, MD PhD Stroke Neurology 01/09/2024 5:37 PM        To contact Stroke Continuity provider, please refer to [email or web address]. After hours, contact General Neurology

## 2024-01-09 NOTE — Evaluation (Signed)
 Physical Therapy Evaluation Patient Details Name: Amber Rose MRN: [id number] DOB: 09-06-1976 Today's Date: 01/09/2024  History of Present Illness  Pt is a 48 yo female presenting to [facility] on 01/07/24 with R sided numbness found to have a L thalamic stroke on imaging. PMH of anemia, DM, fibromyalgia, pancreatitis,  HLD, HTN.  Clinical Impression  Pt admitted secondary to problem above with deficits below. 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 Pt currently requires supervision for gait with challenges with no loss of balance, however gait deviations present. She scored a 19/24 on Dynamic Gait Index, just at the cut-off for no increased risk of falls. Patient agrees to OPPT for continued balance and gait training. Anticipate patient will benefit from PT to address problems listed below.Will continue to follow acutely to maximize functional mobility independence and safety.           If plan is discharge home, recommend the following:     Can travel by private vehicle        Equipment Recommendations None recommended by PT  Recommendations for Other Services       Functional Status Assessment Patient has had a recent decline in their functional status and demonstrates the ability to make significant improvements in function in a reasonable and predictable amount of time.     Precautions / Restrictions Precautions Precautions: Fall Recall of Precautions/Restrictions: Intact Restrictions Weight Bearing Restrictions Per Provider Order: No      Mobility  Bed Mobility Overal bed mobility: Independent                  Transfers Overall transfer level: Independent Equipment used: None Transfers: Sit to/from Stand Sit to Stand: Independent                Ambulation/Gait Ambulation/Gait assistance: Contact guard assist, Supervision Gait Distance (Feet): 200 Feet Assistive device: None Gait  Pattern/deviations: Step-through pattern, Decreased stride length, Wide base of support (RLE slightly externally rotated)   Gait velocity interpretation: >2.62 ft/sec, indicative of community ambulatory   General Gait Details: see DGI  Stairs            Wheelchair Mobility     Tilt Bed    Modified Rankin (Stroke Patients Only) Modified Rankin (Stroke Patients Only) Pre-Morbid Rankin Score: No symptoms Modified Rankin: Slight disability     Balance Overall balance assessment: Needs assistance Sitting-balance support: No upper extremity supported, Feet supported Sitting balance-Leahy Scale: Good     Standing balance support: During functional activity Standing balance-Leahy Scale: Good           Rhomberg - Eyes Opened: 30 Rhomberg - Eyes Closed: 10     Standardized Balance Assessment Standardized Balance Assessment : Dynamic Gait Index   Dynamic Gait Index Level Surface: Mild Impairment Change in Gait Speed: Normal Gait with Horizontal Head Turns: Normal Gait with Vertical Head Turns: Normal Gait and Pivot Turn: Normal Step Over Obstacle: Moderate Impairment Step Around Obstacles: Mild Impairment Steps: Mild Impairment Total Score: 19       Pertinent Vitals/Pain Pain Assessment Pain Assessment: No/denies pain    Home Living Family/patient expects to be discharged to:: Private residence Living Arrangements: Other relatives (has a roommate, kids live down the road) Available Help at Discharge: Family;Available PRN/intermittently Type of Home: House Home Access: Stairs to enter Entrance Stairs-Rails: Doctor, general practice of Steps: 3   Home Layout: One level Home Equipment: Grab bars - tub/shower Additional Comments:  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    Prior Function Prior Level of Function : Independent/Modified Independent;Driving;Working/employed             Mobility Comments: Ind ADLs Comments: ind      Extremity/Trunk Assessment   Upper Extremity Assessment Upper Extremity Assessment: Defer to OT evaluation    Lower Extremity Assessment Lower Extremity Assessment: RLE deficits/detail RLE Sensation: decreased light touch (proprioception intact)    Cervical / Trunk Assessment Cervical / Trunk Assessment: Normal  Communication   Communication Communication: No apparent difficulties    Cognition Arousal: Alert Behavior During Therapy: WFL for tasks assessed/performed                             Following commands: Intact       Cueing Cueing Techniques: Verbal cues     General Comments      Exercises     Assessment/Plan    PT Assessment Patient needs continued PT services  PT Problem List Decreased balance;Decreased mobility;Impaired sensation       PT Treatment Interventions Gait training;Functional mobility training;Stair training;Therapeutic activities;Therapeutic exercise;Balance training;Patient/family education    PT Goals (Current goals can be found in the Care Plan section)  Acute Rehab PT Goals Patient Stated Goal: regain balance PT Goal Formulation: With patient Time For Goal Achievement: 01/23/24 Potential to Achieve Goals: Good    Frequency Min 4X/week     Co-evaluation               AM-PAC PT "6 Clicks" Mobility  Outcome Measure Help needed turning from your back to your side while in a flat bed without using bedrails?: None Help needed moving from lying on your back to sitting on the side of a flat bed without using bedrails?: None Help needed moving to and from a bed to a chair (including a wheelchair)?: None Help needed standing up from a chair using your arms (e.g., wheelchair or bedside chair)?: None Help needed to walk in hospital room?: A Little Help needed climbing 3-5 steps with a railing? : A Little 6 Click Score: 22    End of Session Equipment Utilized During Treatment: Gait belt Activity Tolerance:  Patient tolerated treatment well Patient left: in bed;with call bell/phone within reach;Other (comment) (transporter presentt) Nurse Communication: Mobility status PT Visit Diagnosis: Other abnormalities of gait and mobility (R26.89)    Time: 1610-9604 PT Time Calculation (min) (ACUTE ONLY): 22 min   Charges:   PT Evaluation $PT Eval Low Complexity: 1 Low   PT General Charges $$ ACUTE PT VISIT: 1 Visit          Jerolyn Center, PT Acute Rehabilitation Services  Office [phone]   Zena Amos 01/09/2024, 9:10 AM

## 2024-01-09 NOTE — TOC Initial Note (Signed)
 Transition of Care [facility]) - Initial/Assessment Note    Patient Details  Name: Amber Rose MRN: [id number] Date of Birth: 01-17-1976  Transition of Care [facility] CM/SW Contact:    Verna Czech [place], [place] Phone Number: 01/09/2024, 2:36 PM  Clinical Narrative:                 Patient requested follow up with this social worker to provide resources for financial assistance. Patient currently on disability. Covered under Medicaid and Medicare. Will discharge home to her own home. Parents to provide transport home. Patient confirms having no immediate financial needs, however may need resources in the future.   Discussed option of applying for assistance through the Department of Social Services or [employer] if needed in the future. Patient also encouraged to contact her case management team through her providers office.   Parish Dubose, LCSW Transition of Care    Expected Discharge Plan: Home/Self Care Barriers to Discharge: No Barriers Identified   Patient Goals and CMS Choice Patient states their goals for this hospitalization and ongoing recovery are:: patient discharging home today          Expected Discharge Plan and Services       Living arrangements for the past 2 months: Single Family Home Expected Discharge Date: 01/09/24                                    Prior Living Arrangements/Services Living arrangements for the past 2 months: Single Family Home Lives with:: Self   Do you feel safe going back to the place where you live?: Yes      Need for Family Participation in Patient Care: No (Comment) Care giver support system in place?: Yes (comment)   Criminal Activity/Legal Involvement Pertinent to Current Situation/Hospitalization: No - Comment as needed  Activities of Daily Living   ADL Screening (condition at time of admission) Independently performs ADLs?: Yes (appropriate for developmental age) Is the patient deaf or have difficulty  hearing?: No Does the patient have difficulty seeing, even when wearing glasses/contacts?: No Does the patient have difficulty concentrating, remembering, or making decisions?: No  Permission Sought/Granted                  Emotional Assessment Appearance:: Appears stated age Attitude/Demeanor/Rapport: Engaged Affect (typically observed): Accepting Orientation: : Oriented to Self, Oriented to Place, Oriented to  Time Alcohol / Substance Use: Not Applicable Psych Involvement: No (comment)  Admission diagnosis:  CVA (cerebral vascular accident) (HCC) [I63.9] Lacunar stroke (HCC) [I63.81] Patient Active Problem List   Diagnosis Date Noted   CVA (cerebral vascular accident) (HCC) 01/08/2024   Leukocytosis 01/08/2024   Type 2 diabetes mellitus with chronic kidney disease, with long-term current use of insulin (HCC) 01/08/2024   Hyperlipidemia 01/08/2024   GERD (gastroesophageal reflux disease) 01/08/2024   Overweight (BMI 25.0-29.9) 01/08/2024   Moderately severe major depression (HCC) 01/06/2024   Yeast vaginitis 08/30/2023   Iron deficiency anemia 03/11/2023   PMDD (premenstrual dysphoric disorder) 11/17/2022   Abnormal uterine bleeding (AUB) 11/17/2022   Statin myopathy 09/23/2022   Steatorrhea 08/17/2022   Irregular menses 11/28/2021   Perimenopause 11/28/2021   Episode of recurrent major depressive disorder (HCC) 04/09/2021   BMI 33.0-33.9,adult 04/09/2021   Pure hypercholesterolemia 12/23/2020   Menorrhagia with regular cycle 09/13/2020   Well woman exam 09/13/2020   History of herpes zoster 01/17/2018   Overactive bladder 12/28/2017  Recurrent infections 11/19/2017   Cyst of nasal cavity 11/19/2017   Angio-edema 11/19/2017   Abdominal pain, epigastric 11/04/2017   Dysphagia 11/04/2017   Gastroesophageal reflux disease 07/12/2017   Degenerative disc disease at L5-S1 level 05/14/2017   Fibromyalgia 05/14/2017   ADD (attention deficit disorder) 05/14/2017    Urinary incontinence 04/05/2017   Thigh pain 02/02/2017   Anxiety and depression 02/02/2017   Tobacco abuse 02/02/2017   Facial flushing 02/02/2017   Hypertension 10/23/2016   Hidradenitis 10/23/2016   Diabetes (HCC) 09/12/2014   Lupus anticoagulant positive 07/03/2014   Vitamin D deficiency 06/06/2014   Benign essential hypertension 06/06/2014   PCP:  Hoy Register, MD Pharmacy:   [id number] [place], [place] - [address] [place] [address] Phone: [phone] Fax: [phone]  [facility] - [place], [place] - [address], [address] [place] [address] Phone: [phone] Fax: [phone]  [id number] [place], [place] - [address] AT [facility] [address] [place] [address] Phone: [phone] Fax: [phone]  [facility] - [place], [place] - [address] [place] [address] Phone: [phone] Fax: [phone]     Social Drivers of Health (SDOH) Social History: SDOH Screenings   Food Insecurity: Food Insecurity Present (01/08/2024)  Housing: High Risk (01/08/2024)  Transportation Needs: No Transportation Needs (01/08/2024)  Utilities: Not At Risk (01/08/2024)  Alcohol Screen: Low Risk  (09/20/2023)  Depression (PHQ2-9): High Risk (01/06/2024)  Financial Resource Strain: Medium Risk (09/20/2023)  Physical Activity: Insufficiently Active (09/20/2023)  Social Connections: Socially Isolated (09/20/2023)  Stress: Stress Concern Present (09/20/2023)  Tobacco Use: High Risk (01/07/2024)  Health Literacy: Inadequate Health Literacy (09/20/2023)   SDOH Interventions:     Readmission Risk Interventions     No data to display

## 2024-01-09 NOTE — Progress Notes (Signed)
 Echocardiogram 2D Echocardiogram has been performed.  Tyren Dugar N Auset Fritzler,RDCS 01/09/2024, 9:38 AM

## 2024-01-09 NOTE — Discharge Summary (Signed)
 Physician Discharge Summary   Patient: Amber Rose MRN: [id number] DOB: [birth year]  Admit date:     01/07/2024  Discharge date: 01/09/24  Discharge Physician: Arnetha Courser   PCP: Hoy Register, MD   Recommendations at discharge:  Please obtain CBC and BMP on follow-up Follow-up with primary care provider Follow-up with neurology  Discharge Diagnoses: Principal Problem:   CVA (cerebral vascular accident) [facility]) Active Problems:   Leukocytosis   Hypertension   Type 2 diabetes mellitus with chronic kidney disease, with long-term current use of insulin (HCC)   Hyperlipidemia   History of herpes zoster   Anxiety and depression   Tobacco abuse   GERD (gastroesophageal reflux disease)   Overweight (BMI 25.0-29.9)   Hospital Course: 48 year old female history of insulin dependent DM type II, essential hypertension, hyperlipidemia, headache, hidradenitis suppurativa, chronic smoking and fibromyalgia presented to emergency department complaining about numbness of the right side of the body and weakness.  Patient reported all the symptoms started abruptly on Thursday 9.  Patient reported she has been feeling woozy/lightheaded and then developed tingling sensation of the right side of the body and weakness.  At presentation to ED hemodynamically stable except borderline elevated blood pressure 150/98. CMP unremarkable.  CBC showing leukocytosis 12.5. Blood alcohol level < 10. Negative pregnancy test. UDS positive for THC.  MRI of the brain showing acute infraction of the left thalamus. CT head normal finding.  In the ED patient has been given 1 L of NS bolus, Compazine, Atarax and Tylenol.  Neurology has been consulted for acute stroke.   3/16: Mildly elevated blood pressure at 168/54.  Symptoms improved.CTA head & neck no emergent vascular finding.  Premature atherosclerosis for age.  A1c of 6.4, lipid panel with total cholesterol elevated at 231, triglyceride 165, HDL 23 and  LDL 175.  Apparently patient has intolerance to statin and Repatha.  Neurology discussed with her regarding Leqvio as she need medications for premature atherosclerosis.  Leukocytosis improving. Patient should take aspirin and Plavix together for 3 weeks followed by aspirin only.  Echocardiogram with normal EF and grade 2 diastolic dysfunction.  No other significant abnormality.  PT is recommending outpatient physical therapy.  Patient continued to have mild tingling of right upper extremity with normal strength.  Case was discussed with neurology and she is being discharged on DAPT with aspirin and Plavix and outpatient follow-up with primary care provider and neurology and they will try arranging [facility] as outpatient.  Patient was counseled for risk reduction, should stop smoking, decreased alcohol intake, have a better control of diabetes and hypertension.  She will continue the rest of her home medications.  Initial home antihypertensives were held due to permissive hypertension and she can resume on discharge.  Patient need to follow-up closely with primary care provider and neurology.  Consultants: Neurology Procedures performed: None Disposition: Home Diet recommendation:  Discharge Diet Orders (From admission, onward)     Start     Ordered   01/09/24 0000  Diet - low sodium heart healthy        01/09/24 1253           Cardiac and Carb modified diet DISCHARGE MEDICATION: Allergies as of 01/09/2024       Reactions   Amlodipine    Leg pain, swelling around eyes   Atorvastatin Other (See Comments)    Joint pain   Crestor [rosuvastatin]    Joint pain    Irbesartan    Leg pain, swelling around eyes  Metronidazole Nausea And Vomiting   Penicillins    childhood   Xanax [alprazolam]    "Caused upper respiratory symptoms" per pt   Glyburide Other (See Comments)   "blood sugar dropped uncontrollably"   Linagliptin Diarrhea, Other (See Comments)   Stomach pain, sinus  infection   Moxifloxacin Diarrhea        Medication List     STOP taking these medications    ibuprofen 800 MG tablet Commonly known as: ADVIL       TAKE these medications    Accu-Chek Aviva Plus w/Device Kit USE AS DIRECTED DAILY. E11.9   accu-chek softclix lancets Use as instructed daily.   albuterol 108 (90 Base) MCG/ACT inhaler Commonly known as: ProAir HFA Inhale 2 puffs into the lungs every 4 (four) hours as needed for wheezing or shortness of breath.   aspirin EC 81 MG tablet Take 1 tablet (81 mg total) by mouth daily. Swallow whole. Start taking on: January 10, 2024   buPROPion 150 MG 24 hr tablet Commonly known as: WELLBUTRIN XL Take 1 tablet (150 mg total) by mouth daily.   clopidogrel 75 MG tablet Commonly known as: PLAVIX Take 1 tablet (75 mg total) by mouth daily for 21 days. Start taking on: January 10, 2024   freestyle lancets Use to check blood sugar three times daily. E11.49   FreeStyle Libre 2 Reader [place] Use to check blood sugar three times daily. E11.49   FreeStyle Libre 2 Sensor Misc USE TO CHECK BLOOD SUGAR 3 TIMES A DAY - CHANGE SENSOR EVERY 14 DAYS   FREESTYLE TEST STRIPS test strip Generic drug: glucose blood Use to check blood sugar three times daily. E11.49   hydrOXYzine 50 MG tablet Commonly known as: ATARAX Take 1 tablet (50 mg total) by mouth 3 (three) times daily as needed. What changed:  when to take this reasons to take this   Insulin Pen Needle 31G X 5 MM Misc 1 each by Does not apply route at bedtime.   Lantus SoloStar 100 UNIT/ML Solostar Pen Generic drug: insulin glargine Inject 25 Units into the skin daily. Increase by 2 units to a maximum daily dose of 30 units every 4th day until blood sugars are at goal   Lupron Depot (49-Month) 11.25 MG injection Generic drug: leuprolide Inject 11.25 mg into the muscle every 3 (three) months.   metFORMIN 500 MG tablet Commonly known as: GLUCOPHAGE Take 2 tablets (1,000 mg  total) by mouth 2 (two) times daily with a meal.   methocarbamol 750 MG tablet Commonly known as: ROBAXIN Take 1 tablet (750 mg total) by mouth every 8 (eight) hours as needed for muscle spasms.   nicotine 21 mg/24hr patch Commonly known as: NICODERM CQ - dosed in mg/24 hours Place 1 patch (21 mg total) onto the skin daily. Start taking on: January 10, 2024   norethindrone 5 MG tablet Commonly known as: AYGESTIN TAKE 1 TABLET BY MOUTH THREE TIMES A DAY   omeprazole 40 MG capsule Commonly known as: PRILOSEC TAKE 1 CAPSULE BY MOUTH EVERY  MORNING   pregabalin 100 MG capsule Commonly known as: LYRICA Take 1 capsule (100 mg total) by mouth 2 (two) times daily.   saccharomyces boulardii 250 MG capsule Commonly known as: Florastor Take 1 capsule (250 mg total) by mouth 2 (two) times daily.   Semaglutide(0.25 or 0.5MG /DOS) 2 MG/3ML Sopn Inject 0.25 mg into the skin once a week.   spironolactone 50 MG tablet Commonly known as: ALDACTONE Take 1  tablet (50 mg total) by mouth daily.   valACYclovir 500 MG tablet Commonly known as: Valtrex Take 1 tablet (500 mg total) by mouth 2 (two) times daily. For herpes prophylaxis        Follow-up Information     Hoy Register, MD. Schedule an appointment as soon as possible for a visit in 1 week(s).   Specialty: Family Medicine Contact information: [address] [place] [address] [place] [address] [phone]         Marvel Plan, MD. Schedule an appointment as soon as possible for a visit in 1 week(s).   Specialty: Neurology Contact information: [address] [place] [address] [place] [address] [phone]                Discharge Exam: Ceasar Mons Weights   01/07/24 2151  Weight: 75.1 kg   General.  Well-developed lady, in no acute distress. Pulmonary.  Lungs clear bilaterally, normal respiratory effort. CV.  Regular rate and rhythm, no JVD, rub or murmur. Abdomen.  Soft, nontender, nondistended, BS positive. CNS.   Alert and oriented .  No focal neurologic deficit. Extremities.  No edema, no cyanosis, pulses intact and symmetrical. Psychiatry.  Judgment and insight appears normal.   Condition at discharge: stable  The results of significant diagnostics from this hospitalization (including imaging, microbiology, ancillary and laboratory) are listed below for reference.   Imaging Studies: ECHOCARDIOGRAM COMPLETE Result Date: 01/09/2024    ECHOCARDIOGRAM REPORT   Patient Name:   Sunaina Ferrando Date of Exam: 01/09/2024 Medical Rec #:  [id number]          Height:       64.0 in Accession #:    [id number]         Weight:       165.6 lb Date of Birth:  [birth year]          BSA:          1.805 m Patient Age:    48 years           BP:           168/54 mmHg Patient Gender: F                  HR:           70 bpm. Exam Location:  Inpatient Procedure: 2D Echo, 3D Echo, Color Doppler, Cardiac Doppler and Strain Analysis            (Both Spectral and Color Flow Doppler were utilized during            procedure). Indications:    Stroke  History:        Patient has no prior history of Echocardiogram examinations.                 Risk Factors:Diabetes, Hypertension, Dyslipidemia and Current                 Smoker.  Sonographer:    Raeford Razor RDCS Referring Phys: Erick Blinks IMPRESSIONS  1. Left ventricular ejection fraction, by estimation, is 60 to 65%. The left ventricle has normal function. The left ventricle has no regional wall motion abnormalities. There is mild left ventricular hypertrophy. Left ventricular diastolic parameters are consistent with Grade II diastolic dysfunction (pseudonormalization).  2. Right ventricular systolic function is normal. The right ventricular size is normal. Tricuspid regurgitation signal is inadequate for assessing PA pressure.  3. No evidence of mitral valve regurgitation.  4. The aortic valve  is tricuspid. Aortic valve regurgitation is not visualized.  5. The inferior vena cava is normal in  size with greater than 50% respiratory variability, suggesting right atrial pressure of 3 mmHg. FINDINGS  Left Ventricle: Left ventricular ejection fraction, by estimation, is 60 to 65%. The left ventricle has normal function. The left ventricle has no regional wall motion abnormalities. Strain was performed and the global longitudinal strain is indeterminate. The left ventricular internal cavity size was normal in size. There is mild left ventricular hypertrophy. Left ventricular diastolic parameters are consistent with Grade II diastolic dysfunction (pseudonormalization). Right Ventricle: The right ventricular size is normal. Right ventricular systolic function is normal. Tricuspid regurgitation signal is inadequate for assessing PA pressure. Left Atrium: Left atrial size was normal in size. Right Atrium: Right atrial size was normal in size. Pericardium: There is no evidence of pericardial effusion. Mitral Valve: No evidence of mitral valve regurgitation. Tricuspid Valve: Tricuspid valve regurgitation is not demonstrated. Aortic Valve: The aortic valve is tricuspid. Aortic valve regurgitation is not visualized. Aortic valve peak gradient measures 6.1 mmHg. Pulmonic Valve: Pulmonic valve regurgitation is not visualized. Aorta: The aortic root and ascending aorta are structurally normal, with no evidence of dilitation. Venous: The inferior vena cava is normal in size with greater than 50% respiratory variability, suggesting right atrial pressure of 3 mmHg. IAS/Shunts: No atrial level shunt detected by color flow Doppler.  LEFT VENTRICLE PLAX 2D LVIDd:         4.30 cm   Diastology LVIDs:         2.50 cm   LV e' medial:    12.70 cm/s LV PW:         0.80 cm   LV E/e' medial:  6.4 LV IVS:        0.80 cm   LV e' lateral:   15.20 cm/s LVOT diam:     2.30 cm   LV E/e' lateral: 5.3 LVOT Area:     4.15 cm                           3D Volume EF:                          3D EF:        56 %                          LV EDV:        158 ml                          LV ESV:       69 ml                          LV SV:        89 ml RIGHT VENTRICLE             IVC RV Basal diam:  3.30 cm     IVC diam: 1.30 cm RV Mid diam:    2.00 cm RV S prime:     21.10 cm/s TAPSE (M-mode): 2.8 cm LEFT ATRIUM             Index        RIGHT ATRIUM           Index LA diam:  2.50 cm 1.38 cm/m   RA Area:     13.70 cm LA Vol (A2C):   34.5 ml 19.11 ml/m  RA Volume:   31.40 ml  17.39 ml/m LA Vol (A4C):   32.2 ml 17.84 ml/m LA Biplane Vol: 34.0 ml 18.83 ml/m  AORTIC VALVE AV Area (Vmax): 3.99 cm AV Vmax:        123.00 cm/s AV Peak Grad:   6.1 mmHg LVOT Vmax:      118.00 cm/s  AORTA Ao Root diam: 2.90 cm Ao Asc diam:  3.00 cm MITRAL VALVE MV Area (PHT): 3.42 cm    SHUNTS MV Decel Time: 222 msec    Systemic Diam: 2.30 cm MV E velocity: 81.00 cm/s MV A velocity: 65.50 cm/s MV E/A ratio:  1.24 Halford Decamp signed by Carolan Clines Signature Date/Time: 01/09/2024/12:18:54 PM    Final    CT ANGIO HEAD NECK W WO CM Result Date: 01/08/2024 CLINICAL DATA:  Stroke, determine embolic source. EXAM: CT ANGIOGRAPHY HEAD AND NECK WITH AND WITHOUT CONTRAST TECHNIQUE: Multidetector CT imaging of the head and neck was performed using the standard protocol during bolus administration of intravenous contrast. Multiplanar CT image reconstructions and MIPs were obtained to evaluate the vascular anatomy. Carotid stenosis measurements (when applicable) are obtained utilizing NASCET criteria, using the distal internal carotid diameter as the denominator. RADIATION DOSE REDUCTION: This exam was performed according to the departmental dose-optimization program which includes automated exposure control, adjustment of the mA and/or kV according to patient size and/or use of iterative reconstruction technique. CONTRAST:  75mL OMNIPAQUE IOHEXOL 350 MG/ML SOLN COMPARISON:  Brain MRI from earlier today FINDINGS: CT HEAD FINDINGS Brain: The thalamic infarct is occult compared  to prior MRI. There are clustered calcifications at the inferior left temporal lobe without adjacent edema or masslike finding by MRI. No acute hemorrhage, hydrocephalus, or shift. Vascular: See below Skull: Normal. Negative for fracture or focal lesion. Sinuses/Orbits: Negative Review of the MIP images confirms the above findings CTA NECK FINDINGS Aortic arch: Unremarkable. Right carotid system: Mixed density plaque at the bifurcation with proximal ICA stenosis measuring 30% on reformats. No ulceration or beading Left carotid system: Atheromatous plaque mildly at the bifurcation without ulceration or flow reducing stenosis. Vertebral arteries: No proximal subclavian stenosis. The vertebral arteries are widely patent and smoothly contoured. Skeleton: No acute finding Other neck: No acute finding Upper chest: Clear apical lungs Review of the MIP images confirms the above findings CTA HEAD FINDINGS Anterior circulation: Atheromatous plaque affects the bilateral carotid siphons to a moderate or extensive degree, especially for age. No focal and high-grade stenosis. No branch occlusion, beading, or aneurysm. No evidence of vascular malformation. Posterior circulation: The vertebral and basilar arteries are smoothly contoured and diffusely patent. No branch occlusion, beading, or aneurysm. Venous sinuses: Unremarkable for arterial timing. Some increased density in the left transverse and sigmoid dural sinuses is from reflux in the neck. Anatomic variants: None significant Review of the MIP images confirms the above findings IMPRESSION: 1. No emergent vascular finding. 2. Premature atherosclerosis for age. No flow reducing stenosis of major arteries in the head and neck. Electronically Signed   By: Tiburcio Pea M.D.   On: 01/08/2024 09:49   MR BRAIN WO CONTRAST Result Date: 01/08/2024 CLINICAL DATA:  Neuro deficit with acute stroke suspected EXAM: MRI HEAD WITHOUT CONTRAST TECHNIQUE: Multiplanar, multiecho pulse  sequences of the brain and surrounding structures were obtained without intravenous contrast. COMPARISON:  Head CT from yesterday.  Brain  MRI 07/19/2017 FINDINGS: Brain: Acute lacunar infarct in the posterior left thalamus. Minor FLAIR hyperintensity in the cerebral white matter, mainly periventricular. No pre-existing infarct seen. No hemorrhage, hydrocephalus, masslike finding, or collection. Vascular: Normal flow voids. Skull and upper cervical spine: Normal marrow signal. Sinuses/Orbits: Negative. IMPRESSION: Acute lacunar infarct in the left thalamus. Electronically Signed   By: Tiburcio Pea M.D.   On: 01/08/2024 04:13   CT HEAD WO CONTRAST Result Date: 01/07/2024 CLINICAL DATA:  Acute neurologic deficit EXAM: CT HEAD WITHOUT CONTRAST TECHNIQUE: Contiguous axial images were obtained from the base of the skull through the vertex without intravenous contrast. RADIATION DOSE REDUCTION: This exam was performed according to the departmental dose-optimization program which includes automated exposure control, adjustment of the mA and/or kV according to patient size and/or use of iterative reconstruction technique. COMPARISON:  None Available. FINDINGS: Brain: No mass,hemorrhage or extra-axial collection. Normal appearance of the parenchyma and CSF spaces. Vascular: No hyperdense vessel or unexpected vascular calcification. Skull: The visualized skull base, calvarium and extracranial soft tissues are normal. Sinuses/Orbits: No fluid levels or advanced mucosal thickening of the visualized paranasal sinuses. No mastoid or middle ear effusion. Normal orbits. Other: None. IMPRESSION: Normal head CT. Electronically Signed   By: Deatra Robinson M.D.   On: 01/07/2024 22:54    Microbiology: Results for orders placed or performed in visit on 10/13/23  Urine Culture     Status: None   Collection Time: 10/13/23 11:17 AM  Result Value Ref Range Status   MICRO NUMBER: [id number]  Final   SPECIMEN QUALITY: Adequate   Final   Sample Source URINE  Final   STATUS: FINAL  Final   Result: No Growth  Final  WET PREP FOR TRICH, YEAST, CLUE     Status: None   Collection Time: 10/13/23 12:54 PM   Specimen: Genital  Result Value Ref Range Status   Source: VAGINA  Final   RESULT   Final    Comment: EPITHELIAL CELLS-PRESENT CLUE CELLS-NONE SEEN YEAST-NONE SEEN TRICHOMONAS-NONE SEEN WBC-FEW BACTERIA-MANY EPITH. CELLS (7-12) PER HPF     Labs: CBC: Recent Labs  Lab 01/07/24 2145 01/07/24 2150 01/09/24 0530  WBC 12.5*  --  11.4*  NEUTROABS 8.7*  --   --   HGB 14.5 14.6 14.4  HCT 42.7 43.0 41.4  MCV 94.7  --  91.6  PLT 346  --  344   Basic Metabolic Panel: Recent Labs  Lab 01/07/24 2145 01/07/24 2150 01/09/24 0530  NA 140 140 139  K 4.4 4.1 3.7  CL 107 107 109  CO2 22  --  23  GLUCOSE 117* 110* 129*  BUN 18 20 11   CREATININE 1.00 1.10* 0.99  CALCIUM 9.2  --  8.2*   Liver Function Tests: Recent Labs  Lab 01/07/24 2145  AST 19  ALT 21  ALKPHOS 36*  BILITOT 0.4  PROT 6.3*  ALBUMIN 3.2*   CBG: Recent Labs  Lab 01/08/24 1256 01/08/24 1647 01/08/24 2102 01/09/24 0747 01/09/24 1204  GLUCAP 135* 178* 134* 121* 102*    Discharge time spent: greater than 30 minutes.  This record has been created using Conservation officer, historic buildings. Errors have been sought and corrected,but may not always be located. Such creation errors do not reflect on the standard of care.   Signed: Arnetha Courser, MD [facility] 01/09/2024

## 2024-01-09 NOTE — Plan of Care (Signed)

## 2024-01-09 NOTE — Plan of Care (Cosign Needed)
 Patient is a 48 y.o. female who presented to the ED with right-sided numbness and weakness that began 2 nights ago. PMH includes HTN, hyperlipidemia, type 2 DM, migraines, hidradenitis, herpes zoster, anxiety, depression, tobacco use, and GERD.   At the beginning of the shift, I assisted the patient to the bathroom. She walked independently but, it was evident that she had some weakness on her right side.   CBG value before breakfast time was 121, requiring 1 unit of insulin. Patient did not eat breakfast after returning to unit from endoscopy because food was cold. She expressed that she was hungry and wanted to eat due to her type 2 DM. I gave her crackers, peanut butter, and diet ginger ale. Tried to order lunch shortly after but, patient was not satisfied with diet restrictions therefore, order cancelled.  Administered Lovenox 0843.  Upon today's assessment, patient complained of worsening numbness and weakness specific to her right arm. She verbalized her concern about how to manage this sensation after discharge sense she lives alone. I communicated these concerns to the nurse, educated patient about signs/symptoms that she should report back to ED for, and patient was given Stroke Recovery book. Witnessed patient conversation with Child psychotherapist. She had concerns about resources available to support her while out of work.  Shortly before discharge, I assisted patient to the bathroom so she could get dressed. She reported that she felt weaker when she stood for a lengthier time period. At this time, I reinforced symptoms to report and educated family on monitoring her condition.

## 2024-01-10 ENCOUNTER — Encounter: Payer: Self-pay | Admitting: Family Medicine

## 2024-01-10 ENCOUNTER — Telehealth: Payer: Self-pay

## 2024-01-10 ENCOUNTER — Encounter: Payer: Self-pay | Admitting: Pharmacist Clinician (PhC)/ Clinical Pharmacy Specialist

## 2024-01-10 ENCOUNTER — Other Ambulatory Visit: Payer: Self-pay | Admitting: Family Medicine

## 2024-01-10 ENCOUNTER — Other Ambulatory Visit: Payer: Self-pay

## 2024-01-10 DIAGNOSIS — M79651 Pain in right thigh: Secondary | ICD-10-CM

## 2024-01-10 MED ORDER — METHOCARBAMOL 750 MG PO TABS: 750.0000 mg | ORAL_TABLET | Freq: Three times a day (TID) | ORAL | 3 refills | Status: DC | PRN

## 2024-01-10 NOTE — Telephone Encounter (Signed)
 Copied from CRM [id number]. Topic: Clinical - Medication Question >> Jan 10, 2024  1:16 PM Lars Mage H wrote: Reason for CRM: Patient is following up on two medications that she has some questions about:  A) methocarbamol (ROBAXIN) 750 MG tablet - Does Dr. Alvis Lemmings still want her to take this medicaiton? If so, can we have it called into the [facility]?  B) On the last visit, Valtrex medication was discussed, however, the patient remembers it being once a day, however, the instructions say twice a day from what she picked up from the pharmacy. How is she suppose to take it?  Please call the patient to clarify the above regarding her medications.

## 2024-01-10 NOTE — Telephone Encounter (Signed)
 Methocarbamol is for muscle spasms.  If she is still having muscle aches and spasms I will send a refill to the pharmacy but if not then she does not need to take it.  Valtrex is taking 500 mg twice daily if they are recurrent flares but if flares have been infrequent she can reduce it to 500 mg once a day.

## 2024-01-11 ENCOUNTER — Encounter: Payer: Self-pay | Admitting: Internal Medicine

## 2024-01-11 ENCOUNTER — Telehealth: Payer: Self-pay

## 2024-01-11 ENCOUNTER — Encounter: Payer: Self-pay | Admitting: Pharmacist Clinician (PhC)/ Clinical Pharmacy Specialist

## 2024-01-11 NOTE — Telephone Encounter (Signed)
 Patient identification verified by 2 forms. Marilynn Rail, RN    Called and spoke to patient  RN contacted patient to clarify recent mychart messages  This RN apologized to patient as this RN did not see the previous message she sent  During the call RN read/reviewed mychart message that was sent to pharmacist Belenda Cruise  Patient states:    -she is home and recovering from stroke   -she wanted to provide update regarding her health to Belenda Cruise   -she recalls prior to stroke discussing arm numbness to pharmacist in office   -she does not need any further assistance at this time, only wanted to give update  Encouraged patient to outreach if she needs assistance  Patient agreeable, no further questions at this time

## 2024-01-11 NOTE — Transitions of Care (Post Inpatient/ED Visit) (Signed)
   01/11/2024  Name: Zen Felling MRN: [id number] DOB: [birth year]  Today's TOC FU Call Status: Today's TOC FU Call Status:: Successful TOC FU Call Completed TOC FU Call Complete Date: 01/11/24 Patient's Name and Date of Birth confirmed.  Transition Care Management Follow-up Telephone Call Date of Discharge: 01/09/24 Discharge Facility: [facility]) Type of Discharge: Inpatient Admission Primary Inpatient Discharge Diagnosis:: CVA How have you been since you were released from the hospital?: Better (she reports being tired and continues to have numbness of RUE) Any questions or concerns?: No  Items Reviewed: Did you receive and understand the discharge instructions provided?: Yes Medications obtained,verified, and reconciled?: No Medications Not Reviewed Reasons::  (She said she has all medications as well as a [employer] and she does not have any questions about the med regime and does not need to review the med list She said her mother set up her medications for her.) Any new allergies since your discharge?: No Dietary orders reviewed?: Yes Type of Diet Ordered:: heart healthy, low sodium, carb modified. Do you have support at home?: Yes People in Home: child(ren), adult Name of Support/Comfort Primary Source: She did not specify the child's name.  Medications Reviewed Today: Medications Reviewed Today   Medications were not reviewed in this encounter     Home Care and Equipment/Supplies: Were Home Health Services Ordered?: No (She has been referred to outpatient PT) Any new equipment or medical supplies ordered?: No  Functional Questionnaire: Do you need assistance with bathing/showering or dressing?: No Do you need assistance with meal preparation?: Yes (her family assists) Do you need assistance with eating?: No Do you have difficulty maintaining continence: No Do you need assistance with getting out of bed/getting out of a chair/moving?: No Do you have  difficulty managing or taking your medications?: Yes (She said her mother helped separate her meds)  Follow up appointments reviewed: PCP Follow-up appointment confirmed?: Yes Date of PCP follow-up appointment?: 01/20/24 Follow-up Provider: Corene Cornea, PA Specialist Hospital Follow-up appointment confirmed?: Yes Date of Specialist follow-up appointment?: 01/31/24 Follow-Up Specialty Provider:: neurology; Do you need transportation to your follow-up appointment?: No Do you understand care options if your condition(s) worsen?: Yes-patient verbalized understanding    SIGNATURE Robyne Peers, RN

## 2024-01-12 ENCOUNTER — Telehealth: Payer: Self-pay

## 2024-01-12 NOTE — Telephone Encounter (Signed)
 Dr. Rennis Golden and Belenda Cruise, I see that you both have notes for this patient so I wanted to bring this to your attention as well as Dr. Roda Shutters who by the looks of the therapy plan ordered the [facility].  I submitted a prior authorization to [facility] for the [facility] and it was denied for the following reasons: 1. You do not have a condition for inherited high blood levels of lipids. We need more information about you and your family history for this condition; or 2. Your health notes do not show you have specific signs of problems with your heart and blood vessels; or 3. Your health notes do not show you have primary high cholesterol with an LDL measurement greater than or equal to 190mg /dl  Please let me know how you would like to move forward. I can submit more info to the insurance if needed. I will attach the denial letter to the media tab.  Thank you, Dot Lanes

## 2024-01-16 ENCOUNTER — Other Ambulatory Visit: Payer: Self-pay | Admitting: "Endocrinology

## 2024-01-18 ENCOUNTER — Encounter: Payer: Self-pay | Admitting: "Endocrinology

## 2024-01-18 ENCOUNTER — Encounter: Payer: Self-pay | Admitting: Family Medicine

## 2024-01-20 ENCOUNTER — Ambulatory Visit (INDEPENDENT_AMBULATORY_CARE_PROVIDER_SITE_OTHER): Admitting: "Endocrinology

## 2024-01-20 ENCOUNTER — Encounter: Payer: Self-pay | Admitting: Physician Assistant

## 2024-01-20 ENCOUNTER — Ambulatory Visit: Attending: Physician Assistant | Admitting: Physician Assistant

## 2024-01-20 ENCOUNTER — Encounter: Payer: Self-pay | Admitting: "Endocrinology

## 2024-01-20 VITALS — BP 143/82 | HR 76 | Resp 19 | Ht 64.0 in | Wt 170.4 lb

## 2024-01-20 VITALS — BP 136/86 | HR 86 | Ht 64.0 in | Wt 170.0 lb

## 2024-01-20 DIAGNOSIS — Z794 Long term (current) use of insulin: Secondary | ICD-10-CM

## 2024-01-20 DIAGNOSIS — M79651 Pain in right thigh: Secondary | ICD-10-CM | POA: Diagnosis not present

## 2024-01-20 DIAGNOSIS — E1149 Type 2 diabetes mellitus with other diabetic neurological complication: Secondary | ICD-10-CM

## 2024-01-20 DIAGNOSIS — E782 Mixed hyperlipidemia: Secondary | ICD-10-CM

## 2024-01-20 DIAGNOSIS — E1165 Type 2 diabetes mellitus with hyperglycemia: Secondary | ICD-10-CM | POA: Diagnosis not present

## 2024-01-20 DIAGNOSIS — Z7984 Long term (current) use of oral hypoglycemic drugs: Secondary | ICD-10-CM

## 2024-01-20 DIAGNOSIS — R202 Paresthesia of skin: Secondary | ICD-10-CM

## 2024-01-20 DIAGNOSIS — M79652 Pain in left thigh: Secondary | ICD-10-CM | POA: Diagnosis not present

## 2024-01-20 DIAGNOSIS — I639 Cerebral infarction, unspecified: Secondary | ICD-10-CM | POA: Diagnosis not present

## 2024-01-20 DIAGNOSIS — Z7985 Long-term (current) use of injectable non-insulin antidiabetic drugs: Secondary | ICD-10-CM

## 2024-01-20 DIAGNOSIS — G8191 Hemiplegia, unspecified affecting right dominant side: Secondary | ICD-10-CM

## 2024-01-20 MED ORDER — METHOCARBAMOL 750 MG PO TABS: 750.0000 mg | ORAL_TABLET | Freq: Three times a day (TID) | ORAL | 3 refills | Status: DC | PRN

## 2024-01-20 MED ORDER — INSULIN LISPRO (1 UNIT DIAL) 100 UNIT/ML (KWIKPEN): 1.0000 [IU] | PEN_INJECTOR | Freq: Three times a day (TID) | SUBCUTANEOUS | 1 refills | Status: AC

## 2024-01-20 NOTE — Patient Instructions (Addendum)
  Humalog scale: Use it 15 min before you eat: Space it out by 4 hours.  151 - 175: 1 unit 176 - 200: 2 units 201 - 225: 3 units 226 - 250: 4 units 251 - 275: 5 units 276 - 300: 6 units 301 - 325: 7 units 326 - 350: 8 units 351 - 375: 9 units 376 - 400: 10 units

## 2024-01-20 NOTE — Progress Notes (Unsigned)
 Patient ID: Amber Rose, female   DOB: [birth year], 48 y.o.   MRN: [id number]    Amber Rose, is a 48 y.o. female  MVH:[id number]  [id number]  DOB - [birth year]  Chief Complaint  Patient presents with   Hospitalization Follow-up       Subjective:   Amber Rose is a 48 y.o. female here today for a follow up visit After hospitalization 3/14-3/16 for acute CVA.  She has R hemiparesis.  Her legs are getting stronger and paresthesias only on R thigh now.  R arm is still weak but improving.  She did not get the prescription for methocarbamol yet.  She has also not heard anything about PT yet.  She has a roommate.  She only needs help with things like putting on her jacket, fastening her bra, etc.  Her appetite is good.  She has neurology f/up scheduled.    From discharge summary:  Recommendations at discharge:  Please obtain CBC and BMP on follow-up Follow-up with primary care provider Follow-up with neurology   Discharge Diagnoses: Principal Problem:   CVA (cerebral vascular accident) (HCC) Active Problems:   Leukocytosis   Hypertension   Type 2 diabetes mellitus with chronic kidney disease, with long-term current use of insulin (HCC)   Hyperlipidemia   History of herpes zoster   Anxiety and depression   Tobacco abuse   GERD (gastroesophageal reflux disease)   Overweight (BMI 25.0-29.9)     Hospital Course: 48 year old female history of insulin dependent DM type II, essential hypertension, hyperlipidemia, headache, hidradenitis suppurativa, chronic smoking and fibromyalgia presented to emergency department complaining about numbness of the right side of the body and weakness.  Patient reported all the symptoms started abruptly on Thursday 9.  Patient reported she has been feeling woozy/lightheaded and then developed tingling sensation of the right side of the body and weakness.   At presentation to ED hemodynamically stable except borderline elevated blood  pressure 150/98. CMP unremarkable.  CBC showing leukocytosis 12.5. Blood alcohol level < 10. Negative pregnancy test. UDS positive for THC.   MRI of the brain showing acute infraction of the left thalamus. CT head normal finding.   In the ED patient has been given 1 L of NS bolus, Compazine, Atarax and Tylenol.   Neurology has been consulted for acute stroke.    3/16: Mildly elevated blood pressure at 168/54.  Symptoms improved.CTA head & neck no emergent vascular finding.  Premature atherosclerosis for age.  A1c of 6.4, lipid panel with total cholesterol elevated at 231, triglyceride 165, HDL 23 and LDL 175.  Apparently patient has intolerance to statin and Repatha.  Neurology discussed with her regarding Leqvio as she need medications for premature atherosclerosis.  Leukocytosis improving. Patient should take aspirin and Plavix together for 3 weeks followed by aspirin only.  Echocardiogram with normal EF and grade 2 diastolic dysfunction.  No other significant abnormality.  PT is recommending outpatient physical therapy.   Patient continued to have mild tingling of right upper extremity with normal strength.  Case was discussed with neurology and she is being discharged on DAPT with aspirin and Plavix and outpatient follow-up with primary care provider and neurology and they will try arranging [facility] as outpatient.   Patient was counseled for risk reduction, should stop smoking, decreased alcohol intake, have a better control of diabetes and hypertension.   She will continue the rest of her home medications.  Initial home antihypertensives were held due to permissive hypertension and she can resume on discharge.  Patient need to follow-up closely with primary care provider and neurology. No problems updated.  ALLERGIES: Allergies  Allergen Reactions   Amlodipine     Leg pain, swelling around eyes    Atorvastatin Other (See Comments)     Joint pain   Crestor [Rosuvastatin]      Joint pain    Irbesartan     Leg pain, swelling around eyes   Metronidazole Nausea And Vomiting   Penicillins     childhood   Xanax [Alprazolam]     "Caused upper respiratory symptoms" per pt   Glyburide Other (See Comments)    "blood sugar dropped uncontrollably"   Linagliptin Diarrhea and Other (See Comments)    Stomach pain, sinus infection   Moxifloxacin Diarrhea    PAST MEDICAL HISTORY: Past Medical History:  Diagnosis Date   Abnormal Pap smear of cervix    yrs ago   Adenomyosis    Allergy    Anemia    Anxiety    Aortic atherosclerosis (HCC)    Arthritis    Asthma    Chronic UTI    Clostridium difficile infection    Depression    Diabetes mellitus without complication (HCC) 10/22/2023   A1C 8.8   Diverticulosis    Endometriosis    Fibroid    Fibromyalgia    GERD (gastroesophageal reflux disease)    Hiatal hernia    Hyperlipidemia    Hypertension    Internal hemorrhoids    Meningitis, viral    Migraines    Pancreatitis    Pure hypercholesterolemia 12/23/2020   Tubular adenoma of colon     MEDICATIONS AT HOME: Prior to Admission medications   Medication Sig Start Date End Date Taking? Authorizing Provider  albuterol (PROAIR HFA) 108 (90 Base) MCG/ACT inhaler Inhale 2 puffs into the lungs every 4 (four) hours as needed for wheezing or shortness of breath. 04/05/18  Yes Hoy Register, MD  aspirin EC 81 MG tablet Take 1 tablet (81 mg total) by mouth daily. Swallow whole. 01/10/24  Yes Arnetha Courser, MD  Blood Glucose Monitoring Suppl (ACCU-CHEK AVIVA PLUS) w/Device KIT USE AS DIRECTED DAILY. E11.9 09/13/17  Yes Newlin, Odette Horns, MD  buPROPion (WELLBUTRIN XL) 150 MG 24 hr tablet Take 1 tablet (150 mg total) by mouth daily. 01/06/24  Yes Hoy Register, MD  clopidogrel (PLAVIX) 75 MG tablet Take 1 tablet (75 mg total) by mouth daily for 21 days. 01/10/24 01/31/24 Yes Arnetha Courser, MD  Continuous Blood Gluc Receiver (FREESTYLE LIBRE 2 READER) DEVI Use to check blood  sugar three times daily. E11.49 11/06/22  Yes Hoy Register, MD  Continuous Glucose Sensor (FREESTYLE LIBRE 2 SENSOR) MISC USE TO CHECK BLOOD SUGAR 3 TIMES A DAY - CHANGE SENSOR EVERY 14 DAYS 12/10/23  Yes Newlin, Enobong, MD  glucose blood (FREESTYLE TEST STRIPS) test strip Use to check blood sugar three times daily. E11.49 02/16/23  Yes Hoy Register, MD  hydrOXYzine (ATARAX) 50 MG tablet Take 1 tablet (50 mg total) by mouth 3 (three) times daily as needed. Patient taking differently: Take 50 mg by mouth at bedtime as needed (for sleep). 09/21/23  Yes Alfonse Spruce, MD  insulin glargine (LANTUS SOLOSTAR) 100 UNIT/ML Solostar Pen Inject 25 Units into the skin daily. Increase by 2 units to a maximum daily dose of 30 units every 4th day until blood sugars are at goal 09/20/23  Yes Newlin, Enobong, MD  insulin lispro (HUMALOG KWIKPEN) 100 UNIT/ML KwikPen Inject 1-15 Units into the skin 3 (  three) times daily. 01/20/24 04/19/24 Yes Motwani, Komal, MD  Insulin Pen Needle 31G X 5 MM MISC 1 each by Does not apply route at bedtime. 08/25/23  Yes Hoy Register, MD  Lancet Devices [facility]) lancets Use as instructed daily. 05/14/17  Yes Hoy Register, MD  Lancets (FREESTYLE) lancets Use to check blood sugar three times daily. E11.49 02/16/23  Yes Newlin, Enobong, MD  LUPRON DEPOT, 43-MONTH, 11.25 MG injection Inject 11.25 mg into the muscle every 3 (three) months. 12/23/23  Yes [provider]  metFORMIN (GLUCOPHAGE) 500 MG tablet Take 2 tablets (1,000 mg total) by mouth 2 (two) times daily with a meal. 11/25/23  Yes Motwani, Komal, MD  nicotine (NICODERM CQ - DOSED IN MG/24 HOURS) 21 mg/24hr patch Place 1 patch (21 mg total) onto the skin daily. 01/10/24  Yes Arnetha Courser, MD  norethindrone (AYGESTIN) 5 MG tablet TAKE 1 TABLET BY MOUTH THREE TIMES A DAY 12/20/23  Yes Jerene Bears, MD  omeprazole (PRILOSEC) 40 MG capsule TAKE 1 CAPSULE BY MOUTH EVERY  MORNING 08/09/23  Yes Hoy Register, MD  pregabalin (LYRICA) 100 MG capsule Take 1 capsule (100 mg total) by mouth 2 (two) times daily. 01/06/24  Yes Hoy Register, MD  saccharomyces boulardii (FLORASTOR) 250 MG capsule Take 1 capsule (250 mg total) by mouth 2 (two) times daily. 05/12/22  Yes Unk Lightning, PA  spironolactone (ALDACTONE) 50 MG tablet Take 1 tablet (50 mg total) by mouth daily. 09/16/23  Yes Chilton Si, MD  valACYclovir (VALTREX) 500 MG tablet Take 1 tablet (500 mg total) by mouth 2 (two) times daily. For herpes prophylaxis 01/06/24  Yes Hoy Register, MD  methocarbamol (ROBAXIN) 750 MG tablet Take 1 tablet (750 mg total) by mouth every 8 (eight) hours as needed for muscle spasms. 01/20/24   Brenner Visconti, Amber Schlein, PA-C    ROS: Neg HEENT Neg resp Neg cardiac Neg GI Neg GU Neg MS Neg psych Neg neuro  Objective:   Vitals:   01/20/24 1508  BP: (!) 143/82  Pulse: 76  Resp: 19  SpO2: 100%  Weight: 170 lb 6.4 oz (77.3 kg)  Height: 5\' 4"  (1.626 m)   Exam General appearance : Awake, alert, not in any distress. Speech Clear. Not toxic looking HEENT: Atraumatic and Normocephalic, pupils equally reactive to light and accomodation Neck: Supple, no JVD. No cervical lymphadenopathy.  Chest: Good air entry bilaterally, CTAB.  No rales/rhonchi/wheezing CVS: S1 S2 regular, no murmurs.  Abdomen: Bowel sounds present, Non tender and not distended with no gaurding, rigidity or rebound. Extremities: B/L Lower Ext shows no edema, both legs are warm to touch Neurology: Awake alert, and oriented X 3, CN II-XII intact, Non focal Skin: No Rash  Data Review Lab Results  Component Value Date   HGBA1C 6.4 (H) 01/08/2024   HGBA1C 7.7 (H) 11/22/2023   HGBA1C 8.8 (H) 10/22/2023    Assessment & Plan   1. Type 2 diabetes mellitus with other neurologic complication, without long-term current use of insulin (HCC) (Primary) *** - CMP14+EGFR - CBC with Differential  2. Right hemiparesis (HCC) *** -  methocarbamol (ROBAXIN) 750 MG tablet; Take 1 tablet (750 mg total) by mouth every 8 (eight) hours as needed for muscle spasms.  Dispense: 90 tablet; Refill: 3 - Ambulatory referral to Physical Therapy  3. Paresthesias ***  4. Pain in both thighs *** - methocarbamol (ROBAXIN) 750 MG tablet; Take 1 tablet (750 mg total) by mouth every 8 (eight) hours as needed  for muscle spasms.  Dispense: 90 tablet; Refill: 3  5. Cerebrovascular accident (CVA), unspecified mechanism (HCC) *** - Ambulatory referral to Physical Therapy    Return in about 3 months (around 04/21/2024) for PCP for chronic conditions-Newlin.  The patient was given clear instructions to go to ER or return to medical center if symptoms don't improve, worsen or new problems develop. The patient verbalized understanding. The patient was told to call to get lab results if they haven't heard anything in the next week.      Georgian Co, PA-C [facility] [place], [place] [phone]   01/20/2024, 5:07 PM

## 2024-01-20 NOTE — Progress Notes (Signed)
 Outpatient Endocrinology Note Amber Gallaway, MD  01/20/24   Amber Rose 04/23/1976 [id number]  Referring Provider: Hoy Register, MD Primary Care Provider: Hoy Register, MD Reason for consultation: Subjective   Assessment & Plan  Diagnoses and all orders for this visit:  Uncontrolled type 2 diabetes mellitus with hyperglycemia (HCC)  Long term (current) use of oral hypoglycemic drugs  Long-term insulin use (HCC)  Mixed hypercholesterolemia and hypertriglyceridemia  Other orders -     insulin lispro (HUMALOG KWIKPEN) 100 UNIT/ML KwikPen; Inject 1-15 Units into the skin 3 (three) times daily.     Diabetes Type II complicated by hyperglycemia,  Lab Results  Component Value Date   GFR 106.09 02/20/2022   Hba1c goal less than 7, current Hba1c is  Lab Results  Component Value Date   HGBA1C 6.4 (H) 01/08/2024   Will recommend the following: Lantus 26 units qpm Metformin 500mg  2 tabs bid Ozempic at 0.5 mg per week  Humalog scale: Use it 15 min before you eat: Space it out by 4 hours.  151 - 175: 1 unit 176 - 200: 2 units 201 - 225: 3 units 226 - 250: 4 units 251 - 275: 5 units 276 - 300: 6 units 301 - 325: 7 units 326 - 350: 8 units 351 - 375: 9 units 376 - 400: 10 units   Ordered DM education previously   No known contraindications/side effects to any of above medications No history of MEN syndrome/medullary thyroid cancer/pancreatitis or pancreatic cancer in self or family Stopped ozempic 2mg  3 mo ago due to weight loss-lost 30-40 lbs  Will attempt max dose of ozempic at 1 mg to avoid excess weight loss   -Last LD and Tg are as follows: Lab Results  Component Value Date   LDLCALC 175 (H) 01/08/2024    Lab Results  Component Value Date   TRIG 165 (H) 01/08/2024   -not on statin  -Follow low fat diet and exercise   -Blood pressure goal <140/90 - Microalbumin/creatinine goal is < 30 -Last MA/Cr is as follows: Lab Results   Component Value Date   MICROALBUR 1.3 11/22/2023   -not on ACE/ARB  -diet changes including salt restriction -limit eating outside -counseled BP targets per standards of diabetes care -uncontrolled blood pressure can lead to retinopathy, nephropathy and cardiovascular and atherosclerotic heart disease  Reviewed and counseled on: -A1C target -Blood sugar targets -Complications of uncontrolled diabetes  -Checking blood sugar before meals and bedtime and bring log next visit -All medications with mechanism of action and side effects -Hypoglycemia management: rule of 15's, Glucagon Emergency Kit and medical alert ID -low-carb low-fat plate-method diet -At least 20 minutes of physical activity per day -Annual dilated retinal eye exam and foot exam -compliance and follow up needs -follow up as scheduled or earlier if problem gets worse  Call if blood sugar is less than 70 or consistently above 250    Take a 15 gm snack of carbohydrate at bedtime before you go to sleep if your blood sugar is less than 100.    If you are going to fast after midnight for a test or procedure, ask your physician for instructions on how to reduce/decrease your insulin dose.    Call if blood sugar is less than 70 or consistently above 250  -Treating a low sugar by rule of 15  (15 gms of sugar every 15 min until sugar is more than 70) If you feel your sugar is low, test your sugar  to be sure If your sugar is low (less than 70), then take 15 grams of a fast acting Carbohydrate (3-4 glucose tablets or glucose gel or 4 ounces of juice or regular soda) Recheck your sugar 15 min after treating low to make sure it is more than 70 If sugar is still less than 70, treat again with 15 grams of carbohydrate          Don't drive the hour of hypoglycemia  If unconscious/unable to eat or drink by mouth, use glucagon injection or nasal spray baqsimi and call 911. Can repeat again in 15 min if still unconscious.  Return in  about 3 weeks (around 02/10/2024).   I have reviewed current medications, nurse's notes, allergies, vital signs, past medical and surgical history, family medical history, and social history for this encounter. Counseled patient on symptoms, examination findings, lab findings, imaging results, treatment decisions and monitoring and prognosis. The patient understood the recommendations and agrees with the treatment plan. All questions regarding treatment plan were fully answered.  Amber Exmore, MD  01/20/24    History of Present Illness Amber Rose is a 48 y.o. year old female who presents for follow up of Type II diabetes mellitus.  Anntonette Madewell was first diagnosed in around 2015.   Diabetes education +  Works at night 4 pm -7 am Sleeps [place]-5am, 8:30am-11:30am  8am is BF, lunch is around noon, dinner at 5:30pm and 9 pm is snack   Home diabetes regimen: Lantus 26 units qpm Metformin 500mg  bid  Stopped ozempic 2mg  3 mo ago due to weight loss-lost 30-40 lbs   COMPLICATIONS -  MI/Stroke -  retinopathy -  neuropathy -  nephropathy  SYMPTOMS REVIEWED + Polyuria - Weight loss + Blurred vision  BLOOD SUGAR DATA  CGM interpretation: At today's visit, we reviewed her CGM downloads. The full report is scanned in the media. Reviewing the CGM trends, BG are elevated overnight and afternoon  Physical Exam  BP 136/86   Pulse 86   Ht 5\' 4"  (1.626 m)   Wt 170 lb (77.1 kg)   SpO2 97%   BMI 29.18 kg/m    Constitutional: well developed, well nourished Head: normocephalic, atraumatic Eyes: sclera anicteric, no redness Neck: supple Lungs: normal respiratory effort Neurology: alert and oriented Skin: dry, no appreciable rashes Musculoskeletal: no appreciable defects Psychiatric: normal mood and affect Diabetic Foot Exam - Simple   No data filed      Current Medications Patient's Medications  New Prescriptions   INSULIN LISPRO (HUMALOG KWIKPEN) 100 UNIT/ML  KWIKPEN    Inject 1-15 Units into the skin 3 (three) times daily.  Previous Medications   ALBUTEROL (PROAIR HFA) 108 (90 BASE) MCG/ACT INHALER    Inhale 2 puffs into the lungs every 4 (four) hours as needed for wheezing or shortness of breath.   ASPIRIN EC 81 MG TABLET    Take 1 tablet (81 mg total) by mouth daily. Swallow whole.   BLOOD GLUCOSE MONITORING SUPPL (ACCU-CHEK AVIVA PLUS) W/DEVICE KIT    USE AS DIRECTED DAILY. E11.9   BUPROPION (WELLBUTRIN XL) 150 MG 24 HR TABLET    Take 1 tablet (150 mg total) by mouth daily.   CLOPIDOGREL (PLAVIX) 75 MG TABLET    Take 1 tablet (75 mg total) by mouth daily for 21 days.   CONTINUOUS BLOOD GLUC RECEIVER (FREESTYLE LIBRE 2 READER) DEVI    Use to check blood sugar three times daily. E11.49   CONTINUOUS GLUCOSE SENSOR (FREESTYLE LIBRE  2 SENSOR) MISC    USE TO CHECK BLOOD SUGAR 3 TIMES A DAY - CHANGE SENSOR EVERY 14 DAYS   GLUCOSE BLOOD (FREESTYLE TEST STRIPS) TEST STRIP    Use to check blood sugar three times daily. E11.49   HYDROXYZINE (ATARAX) 50 MG TABLET    Take 1 tablet (50 mg total) by mouth 3 (three) times daily as needed.   INSULIN GLARGINE (LANTUS SOLOSTAR) 100 UNIT/ML SOLOSTAR PEN    Inject 25 Units into the skin daily. Increase by 2 units to a maximum daily dose of 30 units every 4th day until blood sugars are at goal   INSULIN PEN NEEDLE 31G X 5 MM MISC    1 each by Does not apply route at bedtime.   LANCET DEVICES (ACCU-CHEK SOFTCLIX) LANCETS    Use as instructed daily.   LANCETS (FREESTYLE) LANCETS    Use to check blood sugar three times daily. E11.49   LUPRON DEPOT, 67-MONTH, 11.25 MG INJECTION    Inject 11.25 mg into the muscle every 3 (three) months.   METFORMIN (GLUCOPHAGE) 500 MG TABLET    Take 2 tablets (1,000 mg total) by mouth 2 (two) times daily with a meal.   METHOCARBAMOL (ROBAXIN) 750 MG TABLET    Take 1 tablet (750 mg total) by mouth every 8 (eight) hours as needed for muscle spasms.   NICOTINE (NICODERM CQ - DOSED IN MG/24 HOURS)  21 MG/24HR PATCH    Place 1 patch (21 mg total) onto the skin daily.   NORETHINDRONE (AYGESTIN) 5 MG TABLET    TAKE 1 TABLET BY MOUTH THREE TIMES A DAY   OMEPRAZOLE (PRILOSEC) 40 MG CAPSULE    TAKE 1 CAPSULE BY MOUTH EVERY  MORNING   PREGABALIN (LYRICA) 100 MG CAPSULE    Take 1 capsule (100 mg total) by mouth 2 (two) times daily.   SACCHAROMYCES BOULARDII (FLORASTOR) 250 MG CAPSULE    Take 1 capsule (250 mg total) by mouth 2 (two) times daily.   SPIRONOLACTONE (ALDACTONE) 50 MG TABLET    Take 1 tablet (50 mg total) by mouth daily.   VALACYCLOVIR (VALTREX) 500 MG TABLET    Take 1 tablet (500 mg total) by mouth 2 (two) times daily. For herpes prophylaxis  Modified Medications   No medications on file  Discontinued Medications   SEMAGLUTIDE,0.25 OR 0.5MG /DOS, 2 MG/3ML SOPN    Inject 0.25 mg into the skin once a week.    Allergies Allergies  Allergen Reactions   Amlodipine     Leg pain, swelling around eyes    Atorvastatin Other (See Comments)     Joint pain   Crestor [Rosuvastatin]     Joint pain    Irbesartan     Leg pain, swelling around eyes   Metronidazole Nausea And Vomiting   Penicillins     childhood   Xanax [Alprazolam]     "Caused upper respiratory symptoms" per pt   Glyburide Other (See Comments)    "blood sugar dropped uncontrollably"   Linagliptin Diarrhea and Other (See Comments)    Stomach pain, sinus infection   Moxifloxacin Diarrhea    Past Medical History Past Medical History:  Diagnosis Date   Abnormal Pap smear of cervix    yrs ago   Adenomyosis    Allergy    Anemia    Anxiety    Aortic atherosclerosis (HCC)    Arthritis    Asthma    Chronic UTI    Clostridium difficile infection  Depression    Diabetes mellitus without complication (HCC) 10/22/2023   A1C 8.8   Diverticulosis    Endometriosis    Fibroid    Fibromyalgia    GERD (gastroesophageal reflux disease)    Hiatal hernia    Hyperlipidemia    Hypertension    Internal hemorrhoids     Meningitis, viral    Migraines    Pancreatitis    Pure hypercholesterolemia 12/23/2020   Tubular adenoma of colon     Past Surgical History Past Surgical History:  Procedure Laterality Date   APPENDECTOMY  1990   CERVICAL BIOPSY  W/ LOOP ELECTRODE EXCISION     LEEP   CESAREAN SECTION  2002   CHOLECYSTECTOMY  2011   COLONOSCOPY     UPPER GASTROINTESTINAL ENDOSCOPY     URINARY SURGERY     urethra sling, removal, and then revision 2016 (4 surgeries)   WISDOM TOOTH EXTRACTION      Family History family history includes Cancer in her mother; Diabetes in her father; Heart attack in her maternal grandfather; Hypertension in her mother; Multiple sclerosis in her paternal grandmother.  Social History Social History   Socioeconomic History   Marital status: Single    Spouse name: Not on file   Number of children: 3   Years of education: Not on file   Highest education level: Not on file  Occupational History   Not on file  Tobacco Use   Smoking status: Former    Current packs/day: 0.10    Average packs/day: 0.1 packs/day for 20.0 years (2.0 ttl pk-yrs)    Types: Cigarettes   Smokeless tobacco: Never   Tobacco comments:    Patient is engaged in health coaching for smoking cessation as of 12/26/20. Patient stated that she has stopped smoking, drinking and any form of drug usage since her stroke.  Vaping Use   Vaping status: Never Used  Substance and Sexual Activity   Alcohol use: Not Currently    Comment: occ   Drug use: Not Currently    Types: Marijuana    Comment: occ   Sexual activity: Yes    Partners: Male    Birth control/protection: Surgical    Comment: BTL  Other Topics Concern   Not on file  Social History Narrative   Lives home with adult niece.  Not working.  Disability pending.  Pt is single.  Education GED.  3 children.    Social Drivers of Health   Financial Resource Strain: Medium Risk (09/20/2023)   Overall Financial Resource Strain (CARDIA)     Difficulty of Paying Living Expenses: Somewhat hard  Food Insecurity: Food Insecurity Present (01/08/2024)   Hunger Vital Sign    Worried About Running Out of Food in the Last Year: Sometimes true    Ran Out of Food in the Last Year: Sometimes true  Transportation Needs: No Transportation Needs (01/08/2024)   PRAPARE - Administrator, Civil Service (Medical): No    Lack of Transportation (Non-Medical): No  Physical Activity: Insufficiently Active (09/20/2023)   Exercise Vital Sign    Days of Exercise per Week: 3 days    Minutes of Exercise per Session: 30 min  Stress: Stress Concern Present (09/20/2023)   [employer] of Occupational Health - Occupational Stress Questionnaire    Feeling of Stress : To some extent  Social Connections: Socially Isolated (09/20/2023)   Social Connection and Isolation Panel [NHANES]    Frequency of Communication with Friends and Family: Three times  a week    Frequency of Social Gatherings with Friends and Family: Once a week    Attends Religious Services: Never    Active Member of Clubs or Organizations: No    Attends Banker Meetings: Never    Marital Status: Never married  Intimate Partner Violence: Not At Risk (01/08/2024)   Humiliation, Afraid, Rape, and Kick questionnaire    Fear of Current or Ex-Partner: No    Emotionally Abused: No    Physically Abused: No    Sexually Abused: No    Lab Results  Component Value Date   HGBA1C 6.4 (H) 01/08/2024   HGBA1C 7.7 (H) 11/22/2023   HGBA1C 8.8 (H) 10/22/2023   Lab Results  Component Value Date   CHOL 231 (H) 01/08/2024   Lab Results  Component Value Date   HDL 23 (L) 01/08/2024   Lab Results  Component Value Date   LDLCALC 175 (H) 01/08/2024   Lab Results  Component Value Date   TRIG 165 (H) 01/08/2024   Lab Results  Component Value Date   CHOLHDL 10.0 01/08/2024   Lab Results  Component Value Date   CREATININE 0.99 01/09/2024   Lab Results  Component  Value Date   GFR 106.09 02/20/2022   Lab Results  Component Value Date   MICROALBUR 1.3 11/22/2023      Component Value Date/Time   NA 139 01/09/2024 0530   NA 139 09/16/2023 1035   K 3.7 01/09/2024 0530   CL 109 01/09/2024 0530   CO2 23 01/09/2024 0530   GLUCOSE 129 (H) 01/09/2024 0530   BUN 11 01/09/2024 0530   BUN 19 09/16/2023 1035   CREATININE 0.99 01/09/2024 0530   CREATININE 0.70 11/22/2023 0950   CALCIUM 8.2 (L) 01/09/2024 0530   PROT 6.3 (L) 01/07/2024 2145   PROT 6.5 12/01/2022 0955   ALBUMIN 3.2 (L) 01/07/2024 2145   ALBUMIN 3.9 12/01/2022 0955   AST 19 01/07/2024 2145   AST 13 (L) 08/30/2023 1052   ALT 21 01/07/2024 2145   ALT 14 08/30/2023 1052   ALKPHOS 36 (L) 01/07/2024 2145   BILITOT 0.4 01/07/2024 2145   BILITOT 0.6 08/30/2023 1052   GFRNONAA >60 01/09/2024 0530   GFRNONAA >60 08/30/2023 1052   GFRNONAA >89 10/28/2016 0851   GFRAA 127 12/12/2020 1149   GFRAA >89 10/28/2016 0851      Latest Ref Rng & Units 01/09/2024    5:30 AM 01/07/2024    9:50 PM 01/07/2024    9:45 PM  BMP  Glucose 70 - 99 mg/dL 098  119  147   BUN 6 - 20 mg/dL 11  20  18    Creatinine 0.44 - 1.00 mg/dL 8.29  5.62  1.30   Sodium 135 - 145 mmol/L 139  140  140   Potassium 3.5 - 5.1 mmol/L 3.7  4.1  4.4   Chloride 98 - 111 mmol/L 109  107  107   CO2 22 - 32 mmol/L 23   22   Calcium 8.9 - 10.3 mg/dL 8.2   9.2        Component Value Date/Time   WBC 11.4 (H) 01/09/2024 0530   RBC 4.52 01/09/2024 0530   HGB 14.4 01/09/2024 0530   HGB 13.9 06/02/2023 1117   HGB 10.8 (L) 02/05/2023 1147   HCT 41.4 01/09/2024 0530   HCT 33.9 (L) 02/05/2023 1147   PLT 344 01/09/2024 0530   PLT 279 06/02/2023 1117   PLT 312 02/05/2023 1147  MCV 91.6 01/09/2024 0530   MCV 89 02/05/2023 1147   MCH 31.9 01/09/2024 0530   MCHC 34.8 01/09/2024 0530   RDW 14.1 01/09/2024 0530   RDW 14.8 02/05/2023 1147   LYMPHSABS 2.9 01/07/2024 2145   LYMPHSABS 2.0 02/05/2023 1147   MONOABS 0.7 01/07/2024 2145    EOSABS 0.1 01/07/2024 2145   EOSABS 0.0 02/05/2023 1147   BASOSABS 0.1 01/07/2024 2145   BASOSABS 0.0 02/05/2023 1147     Parts of this note may have been dictated using voice recognition software. There may be variances in spelling and vocabulary which are unintentional. Not all errors are proofread. Please notify the Thereasa Parkin if any discrepancies are noted or if the meaning of any statement is not clear.

## 2024-01-21 ENCOUNTER — Encounter: Payer: Self-pay | Admitting: "Endocrinology

## 2024-01-21 ENCOUNTER — Telehealth: Payer: Self-pay | Admitting: "Endocrinology

## 2024-01-21 ENCOUNTER — Other Ambulatory Visit: Payer: Self-pay

## 2024-01-21 ENCOUNTER — Encounter: Payer: Self-pay | Admitting: Physician Assistant

## 2024-01-21 DIAGNOSIS — E1165 Type 2 diabetes mellitus with hyperglycemia: Secondary | ICD-10-CM

## 2024-01-21 LAB — CMP14+EGFR
ALT: 15 IU/L (ref 0–32)
AST: 12 IU/L (ref 0–40)
Albumin: 4.1 g/dL (ref 3.9–4.9)
Alkaline Phosphatase: 57 IU/L (ref 44–121)
BUN/Creatinine Ratio: 20 (ref 9–23)
BUN: 19 mg/dL (ref 6–24)
Bilirubin Total: <0.2 mg/dL (ref 0.0–1.2)
CO2: 20 mmol/L (ref 20–29)
Calcium: 9.2 mg/dL (ref 8.7–10.2)
Chloride: 103 mmol/L (ref 96–106)
Creatinine, Ser: 0.95 mg/dL (ref 0.57–1.00)
Globulin, Total: 2.5 g/dL (ref 1.5–4.5)
Glucose: 179 mg/dL — ABNORMAL HIGH (ref 70–99)
Potassium: 4.6 mmol/L (ref 3.5–5.2)
Sodium: 138 mmol/L (ref 134–144)
Total Protein: 6.6 g/dL (ref 6.0–8.5)
eGFR: 74 mL/min/{1.73_m2} (ref >59)

## 2024-01-21 LAB — CBC WITH DIFFERENTIAL/PLATELET
Basophils Absolute: 0.1 10*3/uL (ref 0.0–0.2)
Basos: 0 %
EOS (ABSOLUTE): 0.1 10*3/uL (ref 0.0–0.4)
Eos: 1 %
Hematocrit: 44.9 % (ref 34.0–46.6)
Hemoglobin: 15 g/dL (ref 11.1–15.9)
Immature Grans (Abs): 0 10*3/uL (ref 0.0–0.1)
Immature Granulocytes: 0 %
Lymphocytes Absolute: 2.5 10*3/uL (ref 0.7–3.1)
Lymphs: 23 %
MCH: 31.8 pg (ref 26.6–33.0)
MCHC: 33.4 g/dL (ref 31.5–35.7)
MCV: 95 fL (ref 79–97)
Monocytes Absolute: 0.7 10*3/uL (ref 0.1–0.9)
Monocytes: 6 %
Neutrophils Absolute: 7.7 10*3/uL — ABNORMAL HIGH (ref 1.4–7.0)
Neutrophils: 70 %
Platelets: 330 10*3/uL (ref 150–450)
RBC: 4.72 x10E6/uL (ref 3.77–5.28)
RDW: 12.7 % (ref 11.7–15.4)
WBC: 11.2 10*3/uL — ABNORMAL HIGH (ref 3.4–10.8)

## 2024-01-21 MED ORDER — INSULIN PEN NEEDLE 32G X 4 MM MISC: 1.0000 | Freq: Three times a day (TID) | 5 refills | Status: AC

## 2024-01-21 NOTE — Telephone Encounter (Signed)
 Rx for needles sent in pt notified.

## 2024-01-21 NOTE — Telephone Encounter (Signed)
 MEDICATION: Needles for Humalog KwikPens  PHARMACY:    [id number] - [place], [place] - [address] (Ph: [phone])    HAS THE PATIENT CONTACTED THEIR PHARMACY?  Yes  IS THIS A 90 DAY SUPPLY : Yes  IS PATIENT OUT OF MEDICATION: Has not started Humalog yet due to not having pens  IF NOT; HOW MUCH IS LEFT:   LAST APPOINTMENT DATE: @3 /27/2025  NEXT APPOINTMENT DATE:@4 /17/2025  DO WE HAVE YOUR PERMISSION TO LEAVE A DETAILED MESSAGE?: Yes  OTHER COMMENTS:    **Let patient know to contact pharmacy at the end of the day to make sure medication is ready. **  ** Please notify patient to allow 48-72 hours to process**  **Encourage patient to contact the pharmacy for refills or they can request refills through [facility]**

## 2024-01-21 NOTE — Telephone Encounter (Signed)
 Requested Prescriptions   Pending Prescriptions Disp Refills   Insulin Pen Needle 32G X 4 MM MISC 200 each 5    Sig: 1 Needle by Does not apply route 3 (three) times daily.

## 2024-01-24 NOTE — Progress Notes (Unsigned)
 Hypertension Clinic Follow Up:    Date:  01/25/2024   ID:  Amber Rose, DOB 1976-09-27, MRN [id number]  PCP:  Hoy Register, MD  Cardiologist:  Chrystie Nose, MD  Nephrologist:  Referring MD: Hoy Register, MD   CC: Hypertension  History of Present Illness:    Amber Rose is a 48 y.o. female with a hx of mild nonobstructive CAD, CVA, hypertension, diabetes, tobacco abuse, aorta atherosclerosis, fibromyalgia, depression, and anxiety here for follow-up.  She first establish care in the advanced hypertension clinic 11/2020.  She was first diagnosed with hypertension in her 65. Her BP was well-controlled but she stopped taking amlodipine. She followed up with her PCP and hydrochlorothiazide was added to her regimen at that time.  Lisinopril was added and by the time she was seen in HTN clinic blood pressure was well-controlled on 2 drug regimen.  She was referred for smoking cessation and into the prep program.  She followed up with Gillian Shields, NP 07/2022 after having stopped amlodipine and irbesartan due to leg pain.  She was started on hydralazine.  Blood pressure was controlled at that time.  She had a coronary CT-A with a calcium score of 124 on 02/2023.  She had mild nonobstructive disease.  When she followed up with Gillian Shields, NP 07/2023 blood pressure was 170/110.  She was started on spironolactone.  She saw Dr. Rennis Golden for statin intolerance.  He was concerned that she likely had FH, he recommended Leqvio but she wanted to think about it.    Discussed the use of AI scribe software for clinical note transcription with the patient, who gave verbal consent to proceed.  History of Present Illness Amber Rose is a 48 year old female with a history of stroke who presents for follow-up regarding her post-stroke symptoms and management of hypertension and diabetes.  She experiences numbness on the right side of her body following a stroke 12/2023, with the numbness  extending from her R shoulder down to her hand.  MRI revealed a L lacunar infarct in the thalamus.  Head CT-A revealed premature atherosclerosis but no acute findings. Echo revealed LVEF 60-65% with mild LVH and grade 2 diastolic dysfunction.   She has been monitoring her blood pressure, noting that the systolic readings have been slightly elevated.  She is currently on spironolactone 50 mg, which she has tolerated well without any swelling, a side effect she experienced with previous medications. She has been off statins due to adverse effects, including swelling, and is considering alternative lipid-lowering therapies.  Her diabetes was well-controlled prior to her hospital admission, but she experienced elevated blood sugar levels post-discharge. She has been adjusting her diet to manage her diabetes and hypertension, focusing on low-salt and low-carb options. She reports significant weight loss and is trying to gain weight. She consumes eggs for breakfast, which she finds stabilizes her blood sugar throughout the day.  She has significantly reduced her smoking from a pack and a half per day to about two cigarettes daily, aided by nicotine patches. She finds it challenging to avoid smoking during meals and has been struggling with increased hunger, which complicates her dietary management.  She is experiencing emotional fluctuations and has good family support.  Previous antihypertensives: Amlodipine - leg cramps, face swelling Irbesartan - leg cramps, face swelling  Hydrochlorothiazide Hydralazine - headache, hot flash, leg pain Doxazosin - leg Lisinopril  Candesartan  Past Medical History:  Diagnosis Date   Abnormal Pap smear of cervix  yrs ago   Adenomyosis    Allergy    Anemia    Anxiety    Aortic atherosclerosis (HCC)    Arthritis    Asthma    Chronic UTI    Clostridium difficile infection    Depression    Diabetes mellitus without complication (HCC) 10/22/2023   A1C 8.8    Diverticulosis    Endometriosis    Fibroid    Fibromyalgia    GERD (gastroesophageal reflux disease)    Hiatal hernia    Hyperlipidemia    Hypertension    Internal hemorrhoids    Meningitis, viral    Migraines    Pancreatitis    Pure hypercholesterolemia 12/23/2020   Tubular adenoma of colon     Past Surgical History:  Procedure Laterality Date   APPENDECTOMY  1990   CERVICAL BIOPSY  W/ LOOP ELECTRODE EXCISION     LEEP   CESAREAN SECTION  2002   CHOLECYSTECTOMY  2011   COLONOSCOPY     UPPER GASTROINTESTINAL ENDOSCOPY     URINARY SURGERY     urethra sling, removal, and then revision 2016 (4 surgeries)   WISDOM TOOTH EXTRACTION      Current Medications: Current Meds  Medication Sig   albuterol (PROAIR HFA) 108 (90 Base) MCG/ACT inhaler Inhale 2 puffs into the lungs every 4 (four) hours as needed for wheezing or shortness of breath.   aspirin EC 81 MG tablet Take 1 tablet (81 mg total) by mouth daily. Swallow whole.   Blood Glucose Monitoring Suppl (ACCU-CHEK AVIVA PLUS) w/Device KIT USE AS DIRECTED DAILY. E11.9   buPROPion (WELLBUTRIN XL) 150 MG 24 hr tablet Take 1 tablet (150 mg total) by mouth daily.   clopidogrel (PLAVIX) 75 MG tablet Take 1 tablet (75 mg total) by mouth daily for 21 days.   Continuous Blood Gluc Receiver (FREESTYLE LIBRE 2 READER) DEVI Use to check blood sugar three times daily. E11.49   Continuous Glucose Sensor (FREESTYLE LIBRE 2 SENSOR) MISC USE TO CHECK BLOOD SUGAR 3 TIMES A DAY - CHANGE SENSOR EVERY 14 DAYS   glucose blood (FREESTYLE TEST STRIPS) test strip Use to check blood sugar three times daily. E11.49   hydrOXYzine (ATARAX) 50 MG tablet Take 1 tablet (50 mg total) by mouth 3 (three) times daily as needed. (Patient taking differently: Take 50 mg by mouth at bedtime as needed (for sleep).)   indapamide (LOZOL) 1.25 MG tablet Take 1 tablet (1.25 mg total) by mouth daily.   insulin glargine (LANTUS SOLOSTAR) 100 UNIT/ML Solostar Pen Inject 25  Units into the skin daily. Increase by 2 units to a maximum daily dose of 30 units every 4th day until blood sugars are at goal   insulin lispro (HUMALOG KWIKPEN) 100 UNIT/ML KwikPen Inject 1-15 Units into the skin 3 (three) times daily.   Insulin Pen Needle 31G X 5 MM MISC 1 each by Does not apply route at bedtime.   Insulin Pen Needle 32G X 4 MM MISC 1 Needle by Does not apply route 3 (three) times daily.   Lancet Devices (ACCU-CHEK SOFTCLIX) lancets Use as instructed daily.   Lancets (FREESTYLE) lancets Use to check blood sugar three times daily. E11.49   metFORMIN (GLUCOPHAGE) 500 MG tablet Take 2 tablets (1,000 mg total) by mouth 2 (two) times daily with a meal.   methocarbamol (ROBAXIN) 750 MG tablet Take 1 tablet (750 mg total) by mouth every 8 (eight) hours as needed for muscle spasms.   nicotine (NICODERM CQ -  DOSED IN MG/24 HOURS) 21 mg/24hr patch Place 1 patch (21 mg total) onto the skin daily.   norethindrone (AYGESTIN) 5 MG tablet TAKE 1 TABLET BY MOUTH THREE TIMES A DAY   omeprazole (PRILOSEC) 40 MG capsule TAKE 1 CAPSULE BY MOUTH EVERY  MORNING   OZEMPIC, 2 MG/DOSE, 8 MG/3ML SOPN Inject 0.5 mg as directed once a week.   pregabalin (LYRICA) 100 MG capsule Take 1 capsule (100 mg total) by mouth 2 (two) times daily.   saccharomyces boulardii (FLORASTOR) 250 MG capsule Take 1 capsule (250 mg total) by mouth 2 (two) times daily.   spironolactone (ALDACTONE) 50 MG tablet Take 1 tablet (50 mg total) by mouth daily.   valACYclovir (VALTREX) 500 MG tablet Take 1 tablet (500 mg total) by mouth 2 (two) times daily. For herpes prophylaxis     Allergies:   Amlodipine, Atorvastatin, Crestor [rosuvastatin], Irbesartan, Metronidazole, Penicillins, Xanax [alprazolam], Glyburide, Linagliptin, and Moxifloxacin   Social History   Socioeconomic History   Marital status: Single    Spouse name: Not on file   Number of children: 3   Years of education: Not on file   Highest education level: Not on  file  Occupational History   Not on file  Tobacco Use   Smoking status: Former    Current packs/day: 0.10    Average packs/day: 0.1 packs/day for 20.0 years (2.0 ttl pk-yrs)    Types: Cigarettes   Smokeless tobacco: Never   Tobacco comments:    Patient is engaged in health coaching for smoking cessation as of 12/26/20. Patient stated that she has stopped smoking, drinking and any form of drug usage since her stroke.  Vaping Use   Vaping status: Never Used  Substance and Sexual Activity   Alcohol use: Not Currently    Comment: occ   Drug use: Not Currently    Types: Marijuana    Comment: occ   Sexual activity: Yes    Partners: Male    Birth control/protection: Surgical    Comment: BTL  Other Topics Concern   Not on file  Social History Narrative   Lives home with adult niece.  Not working.  Disability pending.  Pt is single.  Education GED.  3 children.    Social Drivers of Corporate investment banker Strain: Low Risk  (01/25/2024)   Overall Financial Resource Strain (CARDIA)    Difficulty of Paying Living Expenses: Not very hard  Food Insecurity: Food Insecurity Present (01/25/2024)   Hunger Vital Sign    Worried About Running Out of Food in the Last Year: Sometimes true    Ran Out of Food in the Last Year: Sometimes true  Transportation Needs: No Transportation Needs (01/25/2024)   PRAPARE - Administrator, Civil Service (Medical): No    Lack of Transportation (Non-Medical): No  Physical Activity: Insufficiently Active (01/25/2024)   Exercise Vital Sign    Days of Exercise per Week: 3 days    Minutes of Exercise per Session: 30 min  Stress: Stress Concern Present (01/25/2024)   [employer] of Occupational Health - Occupational Stress Questionnaire    Feeling of Stress : Rather much  Social Connections: Socially Isolated (01/25/2024)   Social Connection and Isolation Panel [NHANES]    Frequency of Communication with Friends and Family: Three times a week     Frequency of Social Gatherings with Friends and Family: Once a week    Attends Religious Services: Never    Active Member of [employer]  or Organizations: No    Attends Banker Meetings: Never    Marital Status: Never married     Family History: The patient's family history includes Cancer in her mother; Diabetes in her father; Heart attack in her maternal grandfather; Hypertension in her mother; Multiple sclerosis in her paternal grandmother.  ROS:   Please see the history of present illness.    All other systems reviewed and are negative.  EKGs/Labs/Other Studies Reviewed:    EKG:  EKG is ordered today.  The ekg ordered today demonstrates sinus rhythm.  Rate 74 bpm.   Recent Labs: 10/13/2023: TSH 0.75 01/20/2024: ALT 15; BUN 19; Creatinine, Ser 0.95; Hemoglobin 15.0; Platelets 330; Potassium 4.6; Sodium 138   Recent Lipid Panel    Component Value Date/Time   CHOL 231 (H) 01/08/2024 0730   CHOL 213 (H) 12/01/2022 0955   TRIG 165 (H) 01/08/2024 0730   HDL 23 (L) 01/08/2024 0730   HDL 43 12/01/2022 0955   CHOLHDL 10.0 01/08/2024 0730   VLDL 33 01/08/2024 0730   LDLCALC 175 (H) 01/08/2024 0730   LDLCALC 184 (H) 11/22/2023 0950   LDLDIRECT 51 08/14/2022 1431    Physical Exam:    VS:  BP (!) 140/82   Pulse 65   Ht 5\' 4"  (1.626 m)   Wt 173 lb 9.6 oz (78.7 kg)   SpO2 99%   BMI 29.80 kg/m  , BMI Body mass index is 29.8 kg/m. GENERAL:  Well appearing HEENT: Pupils equal round and reactive, fundi not visualized, oral mucosa unremarkable NECK:  No jugular venous distention, waveform within normal limits, carotid upstroke brisk and symmetric, no bruits LUNGS:  Clear to auscultation bilaterally HEART:  RRR.  PMI not displaced or sustained,S1 and S2 within normal limits, no S3, no S4, no clicks, no rubs, no murmurs ABD:  Flat, positive bowel sounds normal in frequency in pitch, no bruits, no rebound, no guarding, no midline pulsatile mass, no hepatomegaly, no  splenomegaly EXT:  2 plus pulses throughout, no edema, no cyanosis no clubbing SKIN:  No rashes no nodules NEURO:  Cranial nerves II through XII grossly intact, motor grossly intact throughout PSYCH:  Cognitively intact, oriented to person place and time   ASSESSMENT:    1. Primary hypertension   2. Cerebrovascular accident (CVA) due to thrombosis of left posterior cerebral artery (HCC)   3. Tobacco abuse   4. BMI 33.0-33.9,adult   5. Hyperlipidemia, unspecified hyperlipidemia type   6. Therapeutic drug monitoring     PLAN:    Assessment and Plan Assessment & Plan # Stroke Recent L thalamic stroke with residual right-sided numbness and weakness. Initiating neuro rehabilitation to address symptoms. - Initiate neuro rehabilitation - BP control as below. - Continue aspirin and clopidogrel  # Hypertension Post-stroke hypertension with current blood pressure at 140/82. Spironolactone increased to 50 mg. Considering indapamide for additional control due to its different mechanism and low side effect profile.  She struggles with intolerance to multiple medications.  - Prescribe indapamide 1.25 mg daily - Monitor blood pressure regularly - Obtain lab work in one week to check potassium and kidney function - Follow up in a couple of months to reassess blood pressure control  # Hyperlipidemia Adverse effects from statins, including swelling. Inclisiran Wilber Bihari) considered as an alternative due to better tolerance. Application process initiated but incomplete. - Reapply for inclisiran Wilber Bihari) - Coordinate with Dr. Blanchie Dessert office to facilitate the application process  # Diabetes Mellitus Diabetes well-controlled prior to hospitalization,  now challenging post-discharge. Difficulty with dietary management due to conflicting restrictions for diabetes and hypertension, resulting in weight loss and uncertainty about dietary choices. - Continue current diabetes management - Attend upcoming  nutritionist appointment for dietary guidance  # Smoking Cessation Significant reduction in smoking from 1.5 packs per day to approximately 2 cigarettes per day using nicotine patches. - Continue nicotine patches - Encourage further reduction in smoking  # Mental Health Emotional fluctuations with good family support. Open to discussing mental health with a professional. - Refer to a therapist for mental health support  Medication Management Inquired about timing of Plavix and aspirin. Consistency in timing is emphasized. - Continue current regimen of Plavix and aspirin, ensuring consistent timing   Screening for Secondary Hypertension:     12/23/2020   10:45 AM  Causes  Drugs/Herbals Screened     - Comments NSAIDS  Renovascular HTN Screened  Sleep Apnea Screened  Hyperthyroidism Screened     - Comments check TSH  Thyroid Disease Screened     - Comments check TSH  Hyperaldosteronism Not Screened     - Comments CT w/o adenoma 2021  Pheochromocytoma Not Screened  Cushing's Syndrome Not Screened  Hyperparathyroidism Not Screened  Coarctation of the Aorta Screened     - Comments BP symmetric  Compliance Screened    Relevant Labs/Studies:    Latest Ref Rng & Units 01/20/2024    4:30 PM 01/09/2024    5:30 AM 01/07/2024    9:50 PM  Basic Labs  Sodium 134 - 144 mmol/L 138  139  140   Potassium 3.5 - 5.2 mmol/L 4.6  3.7  4.1   Creatinine 0.57 - 1.00 mg/dL 4.09  8.11  9.14        Latest Ref Rng & Units 10/13/2023   12:07 PM 11/28/2021   11:11 AM  Thyroid   TSH mIU/L 0.75  1.130                Disposition:    FU with MD/PharmD in 1-2 months   Medication Adjustments/Labs and Tests Ordered: Current medicines are reviewed at length with the patient today.  Concerns regarding medicines are outlined above.  Orders Placed This Encounter  Procedures   Basic metabolic panel with GFR   Referral to HRT/VAS Care Navigation   Meds ordered this encounter  Medications    indapamide (LOZOL) 1.25 MG tablet    Sig: Take 1 tablet (1.25 mg total) by mouth daily.    Dispense:  30 tablet    Refill:  5     Signed, Chilton Si, MD  01/25/2024 5:50 PM    [facility]

## 2024-01-25 ENCOUNTER — Ambulatory Visit: Payer: 59 | Attending: Family Medicine

## 2024-01-25 ENCOUNTER — Encounter (HOSPITAL_BASED_OUTPATIENT_CLINIC_OR_DEPARTMENT_OTHER): Payer: Self-pay | Admitting: *Deleted

## 2024-01-25 ENCOUNTER — Encounter (HOSPITAL_BASED_OUTPATIENT_CLINIC_OR_DEPARTMENT_OTHER): Payer: Self-pay | Admitting: Cardiovascular Disease

## 2024-01-25 ENCOUNTER — Encounter: Payer: Self-pay | Admitting: Adult Health

## 2024-01-25 ENCOUNTER — Ambulatory Visit (INDEPENDENT_AMBULATORY_CARE_PROVIDER_SITE_OTHER): Admitting: Cardiovascular Disease

## 2024-01-25 ENCOUNTER — Encounter: Payer: Self-pay | Admitting: Neurology

## 2024-01-25 VITALS — BP 140/82 | HR 65 | Ht 64.0 in | Wt 173.6 lb

## 2024-01-25 VITALS — Ht 63.0 in | Wt 173.0 lb

## 2024-01-25 DIAGNOSIS — Z6833 Body mass index (BMI) 33.0-33.9, adult: Secondary | ICD-10-CM | POA: Diagnosis not present

## 2024-01-25 DIAGNOSIS — E785 Hyperlipidemia, unspecified: Secondary | ICD-10-CM | POA: Diagnosis not present

## 2024-01-25 DIAGNOSIS — Z5181 Encounter for therapeutic drug level monitoring: Secondary | ICD-10-CM

## 2024-01-25 DIAGNOSIS — I63332 Cerebral infarction due to thrombosis of left posterior cerebral artery: Secondary | ICD-10-CM | POA: Diagnosis not present

## 2024-01-25 DIAGNOSIS — Z720 Tobacco use: Secondary | ICD-10-CM

## 2024-01-25 DIAGNOSIS — I10 Essential (primary) hypertension: Secondary | ICD-10-CM

## 2024-01-25 DIAGNOSIS — Z0000 Encounter for general adult medical examination without abnormal findings: Secondary | ICD-10-CM

## 2024-01-25 MED ORDER — INDAPAMIDE 1.25 MG PO TABS: 1.2500 mg | ORAL_TABLET | Freq: Every day | ORAL | 5 refills | Status: DC

## 2024-01-25 NOTE — Progress Notes (Signed)
 Heart and Vascular Care Navigation  01/25/2024  [name] 29-Jul-1976 [id number]  Reason for Referral: community resources Patient is participating in a Managed Medicaid Plan: No, UHC Medicare and Medicaid dual plan  Engaged with patient face to face for initial visit for Heart and Vascular Care Coordination.                                                                                                   Assessment:    LCSW met with Amber Rose after appt at [facility] clinic today. Introduced self, role, and confirmed home address and PCP. Mother remains emergency contact at this time. Resides with roommate, still working about 30 hours a week. Family and friends assisting with transportation, discussed insurance options available should she need to use those for medical appts. Also discussed last minute assistance with getting to and from appts. Previously received SNAP- concerned change in amount due to income. Provided food pantry items that meet Amber Rose dietary needs. Will mail resources for food assistance in community to home address.   We also discussed stroke support group at [facility], meets 2nd Thursday of each month 4-5pm, can contact organizers and bring support person with her if needed. Also provided mental health provider list. Note previous engagement with social workers at [facility] and PCP. Shared if looking for stroke specific provider support may be helpful to engage with support group and speak with other participants about their experiences, recommendations they may have.   LCSW also discussed f/u in a week to ensure any additional questions/concerns that may arise are answered. Encouraged her to let us know if any additional resources/support desired.                                       HRT/VAS Care Coordination     Patients Home Cardiology Office [facility]   Outpatient Care Team Social Worker   Social Worker Name: Octavio Graves, [place], [phone]   Living  arrangements for the past 2 months Single Family Home   Lives with: Roommate   Patient Current Insurance Coverage Managed Medicare; Medicaid   Patient Has Concern With Paying Medical Bills No   Does Patient Have Prescription Coverage? Yes       Social History:                                                                             SDOH Screenings   Food Insecurity: Food Insecurity Present (01/25/2024)  Housing: High Risk (01/25/2024)  Transportation Needs: No Transportation Needs (01/25/2024)  Utilities: Not At Risk (01/25/2024)  Alcohol Screen: Low Risk  (09/20/2023)  Depression (PHQ2-9): High Risk (01/06/2024)  Financial Resource Strain: Medium Risk (09/20/2023)  Physical Activity: Insufficiently Active (  09/20/2023)  Social Connections: Socially Isolated (09/20/2023)  Stress: Stress Concern Present (01/25/2024)  Tobacco Use: Medium Risk (01/20/2024)  Health Literacy: Adequate Health Literacy (01/25/2024)    SDOH Interventions: Financial Resources:   Surveyor, quantity Resources: DSS for financial assistance  Food Insecurity:  Food Insecurity Interventions: [employer] Provided (mailed Environmental manager, provided food from [employer])  Housing Insecurity:  Housing Interventions: Intervention Not Indicated  Transportation:   Transportation Interventions: Patient Resources (Friends/Family), Payor Benefit    Other Care Navigation Interventions:     Provided Pharmacy assistance resources  Amber Rose denies any issues obtaining or affording medications at this time  Patient expressed Mental Health concerns Yes, Referred to:  stroke support groups; mental health resource packet   Follow-up plan:   LCSW provided Amber Rose with mental health resource packet, Stroke Support group at [employer] [facility], will mail food bank resources to have at home and provided a few items from [employer] Amber Rose felt could meet dietary needs. LCSW will f/u with Amber Rose in one week to check in and answer any additional questions/concerns.

## 2024-01-25 NOTE — Patient Instructions (Addendum)
 Medication Instructions:  START INDAPAMIDE 1.25 MG DAILY   WILL REACH OUT TO YOU WHEN FIND ABOUT THE LEQVIO FOR CHOLESTEROL   Labwork: BMET IN 1 WEEK   Testing/Procedures: NONE  Follow-Up: 1 TO 2 MONTHS WITH DR [place] OR CAITLIN W NP   Any Other Special Instructions Will Be Listed Below (If Applicable). MONITOR AND LOG YOUR. BRING YOUR READINGS TO YOUR FOLLOW UP    If you need a refill on your cardiac medications before your next appointment, please call your pharmacy.

## 2024-01-25 NOTE — Patient Instructions (Signed)
 Amber Rose , Thank you for taking time to come for your Medicare Wellness Visit. I appreciate your ongoing commitment to your health goals. Please review the following plan we discussed and let me know if I can assist you in the future.   Referrals/Orders/Follow-Ups/Clinician Recommendations: Keep maintaining your health by keeping your appointments with Dr. Alvis Lemmings and any specialists that you may see.  Call us if you need anything.  Have a great year!!!!  This is a list of the screening recommended for you and due dates:  Health Maintenance  Topic Date Due   COVID-19 Vaccine (1) Never done   Pneumococcal Vaccination (2 of 2 - PCV) 01/05/2025*   Flu Shot  05/26/2024   DTaP/Tdap/Td vaccine (2 - Td or Tdap) 07/08/2024   Hemoglobin A1C  07/10/2024   Eye exam for diabetics  07/27/2024   Yearly kidney health urinalysis for diabetes  11/21/2024   Complete foot exam   01/05/2025   Yearly kidney function blood test for diabetes  01/19/2025   Medicare Annual Wellness Visit  01/24/2025   Pap with HPV screening  09/13/2025   Colon Cancer Screening  08/16/2026   Hepatitis C Screening  Completed   HIV Screening  Completed   HPV Vaccine  Aged Out  *Topic was postponed. The date shown is not the original due date.    Advanced directives: (Declined) Advance directive discussed with you today. Even though you declined this today, please call our office should you change your mind, and we can give you the proper paperwork for you to fill out.  Next Medicare Annual Wellness Visit scheduled for next year: Yes

## 2024-01-25 NOTE — Progress Notes (Signed)
 Because this visit was a virtual/telehealth visit,  certain criteria was not obtained, such a blood pressure, CBG if applicable, and timed get up and go. Any medications not marked as "taking" were not mentioned during the medication reconciliation part of the visit. Any vitals not documented were not able to be obtained due to this being a telehealth visit or patient was unable to self-report a recent blood pressure reading due to a lack of equipment at home via telehealth. Vitals that have been documented are verbally provided by the patient.   Subjective:   Amber Rose is a 48 y.o. who presents for a Medicare Wellness preventive visit.  Visit Complete: Virtual I connected with  [name] on 01/25/24 by a audio enabled telemedicine application and verified that I am speaking with the correct person using two identifiers.  Patient Location: Home  Provider Location: Office/Clinic  I discussed the limitations of evaluation and management by telemedicine. The patient expressed understanding and agreed to proceed.  Vital Signs: Because this visit was a virtual/telehealth visit, some criteria may be missing or patient reported. Any vitals not documented were not able to be obtained and vitals that have been documented are patient reported.  VideoDeclined- This patient declined Librarian, academic. Therefore the visit was completed with audio only.  Persons Participating in Visit: Patient.  AWV Questionnaire: No: Patient Medicare AWV questionnaire was not completed prior to this visit.  Cardiac Risk Factors include: advanced age (>49men, >60 women);diabetes mellitus;dyslipidemia;family history of premature cardiovascular disease;hypertension;obesity (BMI >30kg/m2);sedentary lifestyle     Objective:    Today's Vitals   01/25/24 1130  Weight: 173 lb (78.5 kg)  Height: 5\' 3"  (1.6 m)  PainSc: 5   PainLoc: Generalized   Body mass index is 30.65  kg/m.     01/25/2024   11:33 AM 01/08/2024    9:00 AM 01/07/2024    9:52 PM 11/29/2023   11:39 AM 01/25/2023   11:21 AM 10/02/2021   10:09 AM 04/26/2020   12:34 PM  Advanced Directives  Does Patient Have a Medical Advance Directive? No No No No Yes No No  Type of Advance Directive     Living will    Would patient like information on creating a medical advance directive? No - Patient declined No - Patient declined  Yes (MAU/Ambulatory/Procedural Areas - Information given)  No - Patient declined     Current Medications (verified) Outpatient Encounter Medications as of 01/25/2024  Medication Sig   albuterol (PROAIR HFA) 108 (90 Base) MCG/ACT inhaler Inhale 2 puffs into the lungs every 4 (four) hours as needed for wheezing or shortness of breath.   aspirin EC 81 MG tablet Take 1 tablet (81 mg total) by mouth daily. Swallow whole.   Blood Glucose Monitoring Suppl (ACCU-CHEK AVIVA PLUS) w/Device KIT USE AS DIRECTED DAILY. E11.9   buPROPion (WELLBUTRIN XL) 150 MG 24 hr tablet Take 1 tablet (150 mg total) by mouth daily.   clopidogrel (PLAVIX) 75 MG tablet Take 1 tablet (75 mg total) by mouth daily for 21 days.   Continuous Blood Gluc Receiver (FREESTYLE LIBRE 2 READER) DEVI Use to check blood sugar three times daily. E11.49   Continuous Glucose Sensor (FREESTYLE LIBRE 2 SENSOR) MISC USE TO CHECK BLOOD SUGAR 3 TIMES A DAY - CHANGE SENSOR EVERY 14 DAYS   glucose blood (FREESTYLE TEST STRIPS) test strip Use to check blood sugar three times daily. E11.49   hydrOXYzine (ATARAX) 50 MG tablet Take 1 tablet (50  mg total) by mouth 3 (three) times daily as needed. (Patient taking differently: Take 50 mg by mouth at bedtime as needed (for sleep).)   indapamide (LOZOL) 1.25 MG tablet Take 1 tablet (1.25 mg total) by mouth daily.   insulin glargine (LANTUS SOLOSTAR) 100 UNIT/ML Solostar Pen Inject 25 Units into the skin daily. Increase by 2 units to a maximum daily dose of 30 units every 4th day until blood sugars are  at goal   insulin lispro (HUMALOG KWIKPEN) 100 UNIT/ML KwikPen Inject 1-15 Units into the skin 3 (three) times daily.   Insulin Pen Needle 31G X 5 MM MISC 1 each by Does not apply route at bedtime.   Insulin Pen Needle 32G X 4 MM MISC 1 Needle by Does not apply route 3 (three) times daily.   Lancet Devices (ACCU-CHEK SOFTCLIX) lancets Use as instructed daily.   Lancets (FREESTYLE) lancets Use to check blood sugar three times daily. E11.49   LUPRON DEPOT, 38-MONTH, 11.25 MG injection Inject 11.25 mg into the muscle every 3 (three) months. (Patient not taking: Reported on 01/25/2024)   metFORMIN (GLUCOPHAGE) 500 MG tablet Take 2 tablets (1,000 mg total) by mouth 2 (two) times daily with a meal.   methocarbamol (ROBAXIN) 750 MG tablet Take 1 tablet (750 mg total) by mouth every 8 (eight) hours as needed for muscle spasms.   nicotine (NICODERM CQ - DOSED IN MG/24 HOURS) 21 mg/24hr patch Place 1 patch (21 mg total) onto the skin daily.   norethindrone (AYGESTIN) 5 MG tablet TAKE 1 TABLET BY MOUTH THREE TIMES A DAY   omeprazole (PRILOSEC) 40 MG capsule TAKE 1 CAPSULE BY MOUTH EVERY  MORNING   OZEMPIC, 2 MG/DOSE, 8 MG/3ML SOPN Inject 0.5 mg as directed once a week.   pregabalin (LYRICA) 100 MG capsule Take 1 capsule (100 mg total) by mouth 2 (two) times daily.   saccharomyces boulardii (FLORASTOR) 250 MG capsule Take 1 capsule (250 mg total) by mouth 2 (two) times daily.   spironolactone (ALDACTONE) 50 MG tablet Take 1 tablet (50 mg total) by mouth daily.   valACYclovir (VALTREX) 500 MG tablet Take 1 tablet (500 mg total) by mouth 2 (two) times daily. For herpes prophylaxis   No facility-administered encounter medications on file as of 01/25/2024.    Allergies (verified) Amlodipine, Atorvastatin, Crestor [rosuvastatin], Irbesartan, Metronidazole, Penicillins, Xanax [alprazolam], Glyburide, Linagliptin, and Moxifloxacin   History: Past Medical History:  Diagnosis Date   Abnormal Pap smear of cervix     yrs ago   Adenomyosis    Allergy    Anemia    Anxiety    Aortic atherosclerosis (HCC)    Arthritis    Asthma    Chronic UTI    Clostridium difficile infection    Depression    Diabetes mellitus without complication (HCC) 10/22/2023   A1C 8.8   Diverticulosis    Endometriosis    Fibroid    Fibromyalgia    GERD (gastroesophageal reflux disease)    Hiatal hernia    Hyperlipidemia    Hypertension    Internal hemorrhoids    Meningitis, viral    Migraines    Pancreatitis    Pure hypercholesterolemia 12/23/2020   Tubular adenoma of colon    Past Surgical History:  Procedure Laterality Date   APPENDECTOMY  1990   CERVICAL BIOPSY  W/ LOOP ELECTRODE EXCISION     LEEP   CESAREAN SECTION  2002   CHOLECYSTECTOMY  2011   COLONOSCOPY  UPPER GASTROINTESTINAL ENDOSCOPY     URINARY SURGERY     urethra sling, removal, and then revision 2016 (4 surgeries)   WISDOM TOOTH EXTRACTION     Family History  Problem Relation Age of Onset   Cancer Mother        cervical, breast, lung, skin, bladder-ex smoker   Hypertension Mother    Diabetes Father    Heart attack Maternal Grandfather    Multiple sclerosis Paternal Grandmother    Social History   Socioeconomic History   Marital status: Single    Spouse name: Not on file   Number of children: 3   Years of education: Not on file   Highest education level: Not on file  Occupational History   Not on file  Tobacco Use   Smoking status: Former    Current packs/day: 0.10    Average packs/day: 0.1 packs/day for 20.0 years (2.0 ttl pk-yrs)    Types: Cigarettes   Smokeless tobacco: Never   Tobacco comments:    Patient is engaged in health coaching for smoking cessation as of 12/26/20. Patient stated that she has stopped smoking, drinking and any form of drug usage since her stroke.  Vaping Use   Vaping status: Never Used  Substance and Sexual Activity   Alcohol use: Not Currently    Comment: occ   Drug use: Not Currently    Types:  Marijuana    Comment: occ   Sexual activity: Yes    Partners: Male    Birth control/protection: Surgical    Comment: BTL  Other Topics Concern   Not on file  Social History Narrative   Lives home with adult niece.  Not working.  Disability pending.  Pt is single.  Education GED.  3 children.    Social Drivers of Corporate investment banker Strain: Low Risk  (01/25/2024)   Overall Financial Resource Strain (CARDIA)    Difficulty of Paying Living Expenses: Not very hard  Food Insecurity: Food Insecurity Present (01/25/2024)   Hunger Vital Sign    Worried About Running Out of Food in the Last Year: Sometimes true    Ran Out of Food in the Last Year: Sometimes true  Transportation Needs: No Transportation Needs (01/25/2024)   PRAPARE - Administrator, Civil Service (Medical): No    Lack of Transportation (Non-Medical): No  Physical Activity: Insufficiently Active (01/25/2024)   Exercise Vital Sign    Days of Exercise per Week: 3 days    Minutes of Exercise per Session: 30 min  Stress: Stress Concern Present (01/25/2024)   [employer] of Occupational Health - Occupational Stress Questionnaire    Feeling of Stress : Rather much  Social Connections: Socially Isolated (01/25/2024)   Social Connection and Isolation Panel [NHANES]    Frequency of Communication with Friends and Family: Three times a week    Frequency of Social Gatherings with Friends and Family: Once a week    Attends Religious Services: Never    Database administrator or Organizations: No    Attends Banker Meetings: Never    Marital Status: Never married    Tobacco Counseling Counseling given: Not Answered Tobacco comments: Patient is engaged in health coaching for smoking cessation as of 12/26/20. Patient stated that she has stopped smoking, drinking and any form of drug usage since her stroke.    Clinical Intake:  Pre-visit preparation completed: Yes  Pain : 0-10 Pain Score: 5  Pain Type:  Chronic pain  Pain Location: Generalized     BMI - recorded: 30.65 Nutritional Status: BMI 25 -29 Overweight Nutritional Risks: None Diabetes: Yes CBG done?: No Did pt. bring in CBG monitor from home?: No  Lab Results  Component Value Date   HGBA1C 6.4 (H) 01/08/2024   HGBA1C 7.7 (H) 11/22/2023   HGBA1C 8.8 (H) 10/22/2023     How often do you need to have someone help you when you read instructions, pamphlets, or other written materials from your doctor or pharmacy?: 1 - Never What is the last grade level you completed in school?: GED  Interpreter Needed?: No  Information entered by :: Susie Cassette, LPN.   Activities of Daily Living     01/25/2024   11:35 AM 01/08/2024    9:00 AM  In your present state of health, do you have any difficulty performing the following activities:  Hearing? 0 0  Vision? 0 0  Difficulty concentrating or making decisions? 1 0  Walking or climbing stairs? 0   Dressing or bathing? 0   Doing errands, shopping? 0 0  Preparing Food and eating ? N   Using the Toilet? N   In the past six months, have you accidently leaked urine? Y   Do you have problems with loss of bowel control? N   Managing your Medications? N   Managing your Finances? N   Housekeeping or managing your Housekeeping? N     Patient Care Team: Hoy Register, MD as PCP - General (Family Medicine) Rennis Golden Lisette Abu, MD as PCP - Cardiology (Cardiology) Marval Regal, MD as Consulting Physician (Obstetrics and Gynecology) [facility] as Consulting Physician (Ophthalmology)  Indicate any recent Medical Services you may have received from other than [facility] providers in the past year (date may be approximate).     Assessment:   This is a routine wellness examination for Steptoe.  Hearing/Vision screen Hearing Screening - Comments:: Denies hearing difficulties.  Vision Screening - Comments:: Wears rx glasses - up to date with routine eye exams  with [facility]    Goals Addressed             This Visit's Progress    Client understands the importance of follow-up with providers by attending scheduled visits         Depression Screen     01/25/2024   11:36 AM 01/06/2024   10:00 AM 11/29/2023   11:40 AM 09/20/2023   11:02 AM 08/30/2023    1:24 PM 08/25/2023    4:03 PM 08/04/2023   11:17 AM  PHQ 2/9 Scores  PHQ - 2 Score 6 5 2 6  0 6 0  PHQ- 9 Score 14 19  15  23      Fall Risk     01/25/2024   11:33 AM 01/20/2024    3:09 PM 01/06/2024   10:00 AM 11/29/2023   11:40 AM 10/13/2023   11:22 AM  Fall Risk   Falls in the past year? 0 0 0 0 0  Number falls in past yr: 0 0 0  0  Injury with Fall? 0 0 0  0  Risk for fall due to : No Fall Risks No Fall Risks No Fall Risks  No Fall Risks  Follow up Falls prevention discussed;Falls evaluation completed Falls evaluation completed Falls evaluation completed  Falls evaluation completed    MEDICARE RISK AT HOME:  Medicare Risk at Home Any stairs in or around the home?: No If so,  are there any without handrails?: No Home free of loose throw rugs in walkways, pet beds, electrical cords, etc?: Yes Adequate lighting in your home to reduce risk of falls?: Yes Life alert?: No Use of a cane, walker or w/c?: No Grab bars in the bathroom?: Yes Shower chair or bench in shower?: No Elevated toilet seat or a handicapped toilet?: No  TIMED UP AND GO:  Was the test performed?  No  Cognitive Function: 6CIT completed    01/25/2024   11:34 AM  MMSE - Mini Mental State Exam  Not completed: Unable to complete        01/25/2024   11:34 AM 01/25/2023   11:29 AM  6CIT Screen  What Year? 0 points 0 points  What month? 0 points 0 points  What time? 0 points 0 points  Count back from 20 0 points 0 points  Months in reverse 4 points 0 points  Repeat phrase 0 points 8 points  Total Score 4 points 8 points    Immunizations Immunization History  Administered Date(s) Administered    Pneumococcal Polysaccharide-23 09/27/2017   Tdap 07/08/2014    Screening Tests Health Maintenance  Topic Date Due   COVID-19 Vaccine (1) Never done   Pneumococcal Vaccine 10-49 Years old (2 of 2 - PCV) 01/05/2025 (Originally 09/27/2018)   INFLUENZA VACCINE  05/26/2024   DTaP/Tdap/Td (2 - Td or Tdap) 07/08/2024   HEMOGLOBIN A1C  07/10/2024   OPHTHALMOLOGY EXAM  07/27/2024   Diabetic kidney evaluation - Urine ACR  11/21/2024   FOOT EXAM  01/05/2025   Diabetic kidney evaluation - eGFR measurement  01/19/2025   Medicare Annual Wellness (AWV)  01/24/2025   Cervical Cancer Screening (HPV/Pap Cotest)  09/13/2025   Colonoscopy  08/16/2026   Hepatitis C Screening  Completed   HIV Screening  Completed   HPV VACCINES  Aged Out    Health Maintenance  Health Maintenance Due  Topic Date Due   COVID-19 Vaccine (1) Never done   Health Maintenance Items Addressed: See Nurse Notes  Additional Screening:  Vision Screening: Recommended annual ophthalmology exams for early detection of glaucoma and other disorders of the eye.  Dental Screening: Recommended annual dental exams for proper oral hygiene  Community Resource Referral / Chronic Care Management: CRR required this visit?  No   CCM required this visit?  No     Plan:     I have personally reviewed and noted the following in the patient's chart:   Medical and social history Use of alcohol, tobacco or illicit drugs  Current medications and supplements including opioid prescriptions. Patient is not currently taking opioid prescriptions. Functional ability and status Nutritional status Physical activity Advanced directives List of other physicians Hospitalizations, surgeries, and ER visits in previous 12 months Vitals Screenings to include cognitive, depression, and falls Referrals and appointments  In addition, I have reviewed and discussed with patient certain preventive protocols, quality metrics, and best practice  recommendations. A written personalized care plan for preventive services as well as general preventive health recommendations were provided to patient.     Mickeal Needy, LPN   10/31/1094   After Visit Summary: (MyChart) Due to this being a telephonic visit, the after visit summary with patients personalized plan was offered to patient via MyChart   Notes: Nothing significant to report at this time.

## 2024-01-26 ENCOUNTER — Ambulatory Visit: Attending: Physician Assistant | Admitting: Physical Therapy

## 2024-01-26 ENCOUNTER — Telehealth: Payer: Self-pay

## 2024-01-26 ENCOUNTER — Encounter: Payer: Self-pay | Admitting: Physical Therapy

## 2024-01-26 ENCOUNTER — Other Ambulatory Visit: Payer: Self-pay

## 2024-01-26 VITALS — BP 141/86 | HR 78

## 2024-01-26 DIAGNOSIS — I69351 Hemiplegia and hemiparesis following cerebral infarction affecting right dominant side: Secondary | ICD-10-CM | POA: Insufficient documentation

## 2024-01-26 DIAGNOSIS — M6281 Muscle weakness (generalized): Secondary | ICD-10-CM | POA: Insufficient documentation

## 2024-01-26 DIAGNOSIS — R2689 Other abnormalities of gait and mobility: Secondary | ICD-10-CM | POA: Insufficient documentation

## 2024-01-26 DIAGNOSIS — R2681 Unsteadiness on feet: Secondary | ICD-10-CM | POA: Insufficient documentation

## 2024-01-26 DIAGNOSIS — G8191 Hemiplegia, unspecified affecting right dominant side: Secondary | ICD-10-CM | POA: Insufficient documentation

## 2024-01-26 DIAGNOSIS — I639 Cerebral infarction, unspecified: Secondary | ICD-10-CM | POA: Diagnosis not present

## 2024-01-26 DIAGNOSIS — R41841 Cognitive communication deficit: Secondary | ICD-10-CM | POA: Diagnosis present

## 2024-01-26 NOTE — Progress Notes (Signed)
 [facility] [address] [place]. [place] [address] [phone]       HOSPITAL FOLLOW UP NOTE  [name] Date of Birth:  06-30-1976 Medical Record Number:  [id number]   Reason for Referral:  hospital stroke follow up    SUBJECTIVE:   CHIEF COMPLAINT:  Chief Complaint  Patient presents with   Cerebrovascular Accident    Rm 8 alone Pt is well, reports she is having R sided residual numbness and muscle spasms. She has trouble with small motor skills and holding things. She also mentions losing train of thought easily.     HPI:   Ms. Darcus Rose is a 48 y.o. female with history of DM2, GERD, HTN, HLD, and current tobacco smoking who presented to ED on 01/07/2024 with right face, arm, and leg numbness.  Stroke workup revealed left thalamic infarct secondary to small vessel disease with multiple uncontrolled stroke risk factors.  CTA head/neck negative LVO, noted premature arthrosclerosis for age.  EF 60 to 65%.  LDL 175.  A1c 6.4.  Recommended DAPT for 3 weeks and aspirin alone.  Reported significant intolerance to statin medications as well as Repatha, recommended consideration of Leqvio.  DM and HTN controlled on home regimen.  Current tobacco and THC use with cessation counseling provided.  Therapies recommended outpatient PT/OT.     Today, 01/31/2024, patient is being seen for initial hospital follow-up unaccompanied.  Reports residual right-sided numbness, weakness and cognitive difficulty. Reports some improvement of symptoms since discharge. Had initial PT evaluation recently and has f/u visit 4/11.  Interested in pursuing OT due to difficulty with right hand fine motor control and dexterity.  She occasionally feels muscle spasms in her right arm.  PCP started Robaxin which helped some.  She is also on pregabalin for neuropathy.  Reports mild short-term memory difficulties since her stroke, will easily lose her train of thought during a  conversation and can have delayed recall.  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  She has been on SSI prestroke for fibromyalgia, mood disorder and incontinence post surgical procedure. She did drive yesterday short distance and felt she did okay. She is able to maintain ADLs and majority of IADLs independently.  Denies new stroke/TIA symptoms.  Completed 3 weeks DAPT and remains on aspirin alone.  She has not yet started her Leqvio as she is concerned of potential side effects, she has been making dietary changes in hopes of lowering cholesterol levels on her own.  She plans on following up with PCP in the next 2 months with repeat lipid panel.  Routinely follows with PCP and cardiology.        PERTINENT IMAGING  CT head normal head CT CTA head & neck no emergent vascular finding.  Premature atherosclerosis for age.  MRI  acute lacunar infarct in the left thalamus 2D Echo EF 60 to 65% LDL 175 HgbA1c 6.4 UDS positive for THC    ROS:   14 system review of systems performed and negative with exception of those listed in HPI  PMH:  Past Medical History:  Diagnosis Date   Abnormal Pap smear of cervix    yrs ago   Adenomyosis    Allergy    Anemia    Anxiety    Aortic atherosclerosis (HCC)    Arthritis    Asthma    Chronic UTI  Clostridium difficile infection    Depression    Diabetes mellitus without complication (HCC) 10/22/2023   A1C 8.8   Diverticulosis    Endometriosis    Fibroid    Fibromyalgia    GERD (gastroesophageal reflux disease)    Hiatal hernia    Hyperlipidemia    Hypertension    Internal hemorrhoids    Meningitis, viral    Migraines    Pancreatitis    Pure hypercholesterolemia 12/23/2020   Tubular adenoma of colon     PSH:  Past Surgical History:  Procedure Laterality Date   APPENDECTOMY  1990   CERVICAL BIOPSY   W/ LOOP ELECTRODE EXCISION     LEEP   CESAREAN SECTION  2002   CHOLECYSTECTOMY  2011   COLONOSCOPY     UPPER GASTROINTESTINAL ENDOSCOPY     URINARY SURGERY     urethra sling, removal, and then revision 2016 (4 surgeries)   WISDOM TOOTH EXTRACTION      Social History:  Social History   Socioeconomic History   Marital status: Single    Spouse name: Not on file   Number of children: 3   Years of education: Not on file   Highest education level: Not on file  Occupational History   Not on file  Tobacco Use   Smoking status: Former    Current packs/day: 0.10    Average packs/day: 0.1 packs/day for 20.0 years (2.0 ttl pk-yrs)    Types: Cigarettes   Smokeless tobacco: Never   Tobacco comments:    Patient is engaged in health coaching for smoking cessation as of 12/26/20. Patient stated that she has stopped smoking, drinking and any form of drug usage since her stroke.  Vaping Use   Vaping status: Never Used  Substance and Sexual Activity   Alcohol use: Not Currently    Comment: occ   Drug use: Not Currently    Types: Marijuana    Comment: occ   Sexual activity: Yes    Partners: Male    Birth control/protection: Surgical    Comment: BTL  Other Topics Concern   Not on file  Social History Narrative   Lives home with adult niece.  Not working.  Disability pending.  Pt is single.  Education GED.  3 children.    Social Drivers of Corporate investment banker Strain: Low Risk  (01/25/2024)   Overall Financial Resource Strain (CARDIA)    Difficulty of Paying Living Expenses: Not very hard  Food Insecurity: Food Insecurity Present (01/25/2024)   Hunger Vital Sign    Worried About Running Out of Food in the Last Year: Sometimes true    Ran Out of Food in the Last Year: Sometimes true  Transportation Needs: No Transportation Needs (01/25/2024)   PRAPARE - Administrator, Civil Service (Medical): No    Lack of Transportation (Non-Medical): No  Physical Activity:  Insufficiently Active (01/25/2024)   Exercise Vital Sign    Days of Exercise per Week: 3 days    Minutes of Exercise per Session: 30 min  Stress: Stress Concern Present (01/25/2024)   [employer] of Occupational Health - Occupational Stress Questionnaire    Feeling of Stress : Rather much  Social Connections: Socially Isolated (01/25/2024)   Social Connection and Isolation Panel [NHANES]    Frequency of Communication with Friends and Family: Three times a week    Frequency of Social Gatherings with Friends and Family: Once a week    Attends Religious Services: Never  Active Member of Clubs or Organizations: No    Attends Banker Meetings: Never    Marital Status: Never married  Intimate Partner Violence: Not At Risk (01/25/2024)   Humiliation, Afraid, Rape, and Kick questionnaire    Fear of Current or Ex-Partner: No    Emotionally Abused: No    Physically Abused: No    Sexually Abused: No    Family History:  Family History  Problem Relation Age of Onset   Cancer Mother        cervical, breast, lung, skin, bladder-ex smoker   Hypertension Mother    Diabetes Father    Heart attack Maternal Grandfather    Multiple sclerosis Paternal Grandmother     Medications:   Current Outpatient Medications on File Prior to Visit  Medication Sig Dispense Refill   albuterol (PROAIR HFA) 108 (90 Base) MCG/ACT inhaler Inhale 2 puffs into the lungs every 4 (four) hours as needed for wheezing or shortness of breath. 3 Inhaler 0   aspirin EC 81 MG tablet Take 1 tablet (81 mg total) by mouth daily. Swallow whole. 30 tablet 12   Blood Glucose Monitoring Suppl (ACCU-CHEK AVIVA PLUS) w/Device KIT USE AS DIRECTED DAILY. E11.9 1 kit 0   buPROPion (WELLBUTRIN XL) 150 MG 24 hr tablet Take 1 tablet (150 mg total) by mouth daily. 90 tablet 1   clopidogrel (PLAVIX) 75 MG tablet Take 1 tablet (75 mg total) by mouth daily for 21 days. 21 tablet 0   Continuous Blood Gluc Receiver (FREESTYLE LIBRE 2  READER) DEVI Use to check blood sugar three times daily. E11.49 1 each 0   Continuous Glucose Sensor (FREESTYLE LIBRE 2 SENSOR) MISC USE TO CHECK BLOOD SUGAR 3 TIMES A DAY - CHANGE SENSOR EVERY 14 DAYS 2 each 41   glucose blood (FREESTYLE TEST STRIPS) test strip Use to check blood sugar three times daily. E11.49 100 each 3   hydrOXYzine (ATARAX) 50 MG tablet Take 1 tablet (50 mg total) by mouth 3 (three) times daily as needed. (Patient taking differently: Take 50 mg by mouth at bedtime as needed (for sleep).) 30 tablet 5   indapamide (LOZOL) 1.25 MG tablet Take 1 tablet (1.25 mg total) by mouth daily. 30 tablet 5   insulin glargine (LANTUS SOLOSTAR) 100 UNIT/ML Solostar Pen Inject 25 Units into the skin daily. Increase by 2 units to a maximum daily dose of 30 units every 4th day until blood sugars are at goal 15 mL 6   insulin lispro (HUMALOG KWIKPEN) 100 UNIT/ML KwikPen Inject 1-15 Units into the skin 3 (three) times daily. 43 mL 1   Insulin Pen Needle 31G X 5 MM MISC 1 each by Does not apply route at bedtime. 30 each 5   Insulin Pen Needle 32G X 4 MM MISC 1 Needle by Does not apply route 3 (three) times daily. 200 each 5   Lancet Devices (ACCU-CHEK SOFTCLIX) lancets Use as instructed daily. 1 each 5   Lancets (FREESTYLE) lancets Use to check blood sugar three times daily. E11.49 100 each 3   LUPRON DEPOT, 19-MONTH, 11.25 MG injection Inject 11.25 mg into the muscle every 3 (three) months.     metFORMIN (GLUCOPHAGE) 500 MG tablet Take 2 tablets (1,000 mg total) by mouth 2 (two) times daily with a meal. 120 tablet 4   methocarbamol (ROBAXIN) 750 MG tablet Take 1 tablet (750 mg total) by mouth every 8 (eight) hours as needed for muscle spasms. 90 tablet 3   nicotine (  NICODERM CQ - DOSED IN MG/24 HOURS) 21 mg/24hr patch Place 1 patch (21 mg total) onto the skin daily. 28 patch 0   norethindrone (AYGESTIN) 5 MG tablet TAKE 1 TABLET BY MOUTH THREE TIMES A DAY 252 tablet 2   omeprazole (PRILOSEC) 40 MG  capsule TAKE 1 CAPSULE BY MOUTH EVERY  MORNING 100 capsule 0   OZEMPIC, 2 MG/DOSE, 8 MG/3ML SOPN Inject 0.5 mg as directed once a week.     pregabalin (LYRICA) 100 MG capsule Take 1 capsule (100 mg total) by mouth 2 (two) times daily. 60 capsule 5   saccharomyces boulardii (FLORASTOR) 250 MG capsule Take 1 capsule (250 mg total) by mouth 2 (two) times daily. 60 capsule 0   spironolactone (ALDACTONE) 50 MG tablet Take 1 tablet (50 mg total) by mouth daily. 90 tablet 3   valACYclovir (VALTREX) 500 MG tablet Take 1 tablet (500 mg total) by mouth 2 (two) times daily. For herpes prophylaxis 90 tablet 1   No current facility-administered medications on file prior to visit.    Allergies:   Allergies  Allergen Reactions   Amlodipine     Leg pain, swelling around eyes    Atorvastatin Other (See Comments)     Joint pain   Crestor [Rosuvastatin]     Joint pain    Irbesartan     Leg pain, swelling around eyes   Metronidazole Nausea And Vomiting   Penicillins     childhood   Xanax [Alprazolam]     "Caused upper respiratory symptoms" per pt   Glyburide Other (See Comments)    "blood sugar dropped uncontrollably"   Linagliptin Diarrhea and Other (See Comments)    Stomach pain, sinus infection   Moxifloxacin Diarrhea      OBJECTIVE:  Physical Exam  Vitals:   01/31/24 0911 01/31/24 0917  BP: (!) 150/102 (!) 142/90  Pulse: 85 83  Weight: 169 lb (76.7 kg)   Height: 5\' 3"  (1.6 m)    Body mass index is 29.94 kg/m. No results found.   General: well developed, well nourished, pleasant middle-aged female, seated, in no evident distress Head: head normocephalic and atraumatic.   Neck: supple with no carotid or supraclavicular bruits Cardiovascular: regular rate and rhythm, no murmurs Musculoskeletal: no deformity Skin:  no rash/petichiae Vascular:  Normal pulses all extremities   Neurologic Exam Mental Status: Awake and fully alert.  Fluent speech and language.  Oriented to place  and time. Recent memory subjectively mildly impaired and remote memory intact. Attention span, concentration and fund of knowledge appropriate. Mood and affect appropriate.  Cranial Nerves: Pupils equal, briskly reactive to light. Extraocular movements full without nystagmus. Visual fields full to confrontation. Hearing intact.  Decreased facial sensation split down midline.  Face, tongue, palate moves normally and symmetrically.  Motor: Normal strength, bulk and tone left upper and lower extremity.  Mild 4/5 right hemiparesis with decreased hand dexterity. Sensory.:  Decreased right upper and lower light touch sensation compared to left side Coordination: Rapid alternating movements is impaired on right side. Finger-to-nose and heel-to-shin performed accurately on left side. Gait and Station: Arises from chair without difficulty. Stance is normal. Gait demonstrates normal stride length and mild imbalance without use of AD.  Difficulty performing tandem walk and heel toe .  Reflexes: 1+ and symmetric. Toes downgoing.     NIHSS  2 Modified Rankin  2-3      ASSESSMENT: Vona Whiters is a 48 y.o. year old female with left thalamic stroke in  12/2023 secondary to small vessel disease. Vascular risk factors include HTN, DM complicated by diabetic neuropathy, uncontrolled HLD with history of statin intolerance, tobacco and THC use, and migraine headaches.      PLAN:  Left thalamic stroke:  Residual deficit: Right hemiparesis with sensory impairment and mild cognitive impairment.  Continue working with PT, referrals placed for OT and SLP.  Discussed typical recovery time. Continue aspirin 81mg  daily for secondary stroke prevention managed/prescribed by PCP.  Encouraged pursuing Leqvio for HLD management but patient hesitant due to potential side effects with prior history of side effects on statins and Repatha. Plans on f/u with PCP for repeat lipid panel after making dietary changes and will  further consider if LDL remains elevated  Discussed secondary stroke prevention measures and importance of close PCP, endocrinology and cardiology follow up for aggressive stroke risk factor management including BP goal<130/90, HLD with LDL goal<70 and DM with A1c.<7 .  Stroke labs 12/2023: LDL 175, A1c 6.4 I have gone over the pathophysiology of stroke, warning signs and symptoms, risk factors and their management in some detail with instructions to go to the closest emergency room for symptoms of concern.     No further recommendations from stroke standpoint and is closely followed by PCP, cardiology and endocrinology.  She can follow-up here as needed and advised to call with any questions or concerns in the future.  She verbalized understanding and agrees with plan.   CC:  [facility] provider: Dr. Pearlean Brownie PCP: Hoy Register, MD    I spent 55 minutes of face-to-face and non-face-to-face time with patient.  This included previsit chart review, lab review, study review, order entry, electronic health record documentation, patient education and discussion regarding above diagnoses and treatment plan and answered all other questions to patient's satisfaction  Ihor Austin, [facility]  [facility] [address] [place], [place] [address]  Phone [phone] Fax [phone] Note: This document was prepared with digital dictation and possible smart phrase technology. Any transcriptional errors that result from this process are unintentional.

## 2024-01-26 NOTE — Telephone Encounter (Signed)
 Dr. Roda Shutters, patient will be scheduled as soon as possible.  Auth Submission: APPROVED Site of care: Site of care: [facility] Payer: [employer] dual complete medicare Medication & CPT/J Code(s) submitted: Leqvio (Inclisiran) [id number] Route of submission (phone, fax, portal): portal Phone # Fax # Auth type: Buy/Bill PB Units/visits requested: 284mg  x 3 doses Reference number: [id number] Approval from: 01/26/24 to 01/25/25

## 2024-01-26 NOTE — Therapy (Signed)
 OUTPATIENT PHYSICAL THERAPY NEURO EVALUATION   Patient Name: Johnella Crumm MRN: [id number] DOB:01/01/1976, 48 y.o., female Today's Date: 01/26/2024   PCP: Hoy Register, MD REFERRING PROVIDER: Anders Simmonds, PA-C  END OF SESSION:  PT End of Session - 01/26/24 1159     Visit Number 1    Number of Visits 9   8 + eval   Date for PT Re-Evaluation 03/31/24   pushed out due to future multi-disciplinary needs   Authorization Type [employer] dual complete    PT Start Time 1152   pt arrived to clinic late   PT Stop Time 1234    PT Time Calculation (min) 42 min    Equipment Utilized During Treatment Gait belt    Activity Tolerance Patient tolerated treatment well    Behavior During Therapy WFL for tasks assessed/performed             Past Medical History:  Diagnosis Date   Abnormal Pap smear of cervix    yrs ago   Adenomyosis    Allergy    Anemia    Anxiety    Aortic atherosclerosis (HCC)    Arthritis    Asthma    Chronic UTI    Clostridium difficile infection    Depression    Diabetes mellitus without complication (HCC) 10/22/2023   A1C 8.8   Diverticulosis    Endometriosis    Fibroid    Fibromyalgia    GERD (gastroesophageal reflux disease)    Hiatal hernia    Hyperlipidemia    Hypertension    Internal hemorrhoids    Meningitis, viral    Migraines    Pancreatitis    Pure hypercholesterolemia 12/23/2020   Tubular adenoma of colon    Past Surgical History:  Procedure Laterality Date   APPENDECTOMY  1990   CERVICAL BIOPSY  W/ LOOP ELECTRODE EXCISION     LEEP   CESAREAN SECTION  2002   CHOLECYSTECTOMY  2011   COLONOSCOPY     UPPER GASTROINTESTINAL ENDOSCOPY     URINARY SURGERY     urethra sling, removal, and then revision 2016 (4 surgeries)   WISDOM TOOTH EXTRACTION     Patient Active Problem List   Diagnosis Date Noted   CVA (cerebral vascular accident) (HCC) 01/08/2024   Leukocytosis 01/08/2024   Type 2 diabetes mellitus with chronic kidney  disease, with long-term current use of insulin (HCC) 01/08/2024   Hyperlipidemia 01/08/2024   GERD (gastroesophageal reflux disease) 01/08/2024   Overweight (BMI 25.0-29.9) 01/08/2024   Moderately severe major depression (HCC) 01/06/2024   Yeast vaginitis 08/30/2023   Iron deficiency anemia 03/11/2023   PMDD (premenstrual dysphoric disorder) 11/17/2022   Abnormal uterine bleeding (AUB) 11/17/2022   Statin myopathy 09/23/2022   Steatorrhea 08/17/2022   Irregular menses 11/28/2021   Perimenopause 11/28/2021   Episode of recurrent major depressive disorder (HCC) 04/09/2021   BMI 33.0-33.9,adult 04/09/2021   Pure hypercholesterolemia 12/23/2020   Menorrhagia with regular cycle 09/13/2020   Well woman exam 09/13/2020   History of herpes zoster 01/17/2018   Overactive bladder 12/28/2017   Recurrent infections 11/19/2017   Cyst of nasal cavity 11/19/2017   Angio-edema 11/19/2017   Abdominal pain, epigastric 11/04/2017   Dysphagia 11/04/2017   Gastroesophageal reflux disease 07/12/2017   Degenerative disc disease at L5-S1 level 05/14/2017   Fibromyalgia 05/14/2017   ADD (attention deficit disorder) 05/14/2017   Urinary incontinence 04/05/2017   Thigh pain 02/02/2017   Anxiety and depression 02/02/2017   Tobacco abuse 02/02/2017  Facial flushing 02/02/2017   Hypertension 10/23/2016   Hidradenitis 10/23/2016   Diabetes (HCC) 09/12/2014   Lupus anticoagulant positive 07/03/2014   Vitamin D deficiency 06/06/2014    ONSET DATE: 01/07/2024 (Lacunar CVA)  REFERRING DIAG: G81.91 (ICD-10-CM) - Right hemiparesis (HCC) I63.9 (ICD-10-CM) - Cerebrovascular accident (CVA), unspecified mechanism (HCC)  THERAPY DIAG:  Hemiplegia and hemiparesis following cerebral infarction affecting right dominant side (HCC)  Other abnormalities of gait and mobility  Muscle weakness (generalized)  Unsteadiness on feet  Rationale for Evaluation and Treatment: Rehabilitation  SUBJECTIVE:                                                                                                                                                                                              SUBJECTIVE STATEMENT: "I'm basically numb from the midline of my face down and it mostly affects my balance.  I'm definitely unsteady."  "I drop things out of my right hand if I am not focused on it, I have a hard time strapping my bra and cooking."  She feels her eating and other right hand tasks are uncoordinated. Pt accompanied by: self (family dropped her off)  PERTINENT HISTORY: HTN, anxiety, depression, fibromyalgia, herpes zoster, DM2, statin intolerance, thalamic CVA, CKD  PAIN:  Are you having pain? Yes: NPRS scale: 5 Pain location: right arm (mostly) Pain description: cramping/stiffness in right hemibody Aggravating factors: using the arm, laying on the right Relieving factors: sitting still  PRECAUTIONS: Fall  RED FLAGS: Bowel or bladder incontinence: Yes: urinary incontinence - wearing depends today and MD aware per report    WEIGHT BEARING RESTRICTIONS: No  FALLS: Has patient fallen in last 6 months? No  LIVING ENVIRONMENT: Lives with: lives with an adult companion Lives in: House/apartment Stairs: Yes: External: 3 steps; none Has following equipment at home: Grab bars  PLOF: Independent with gait, Independent with transfers, Needs assistance with ADLs, and Needs assistance with homemaking  PATIENT GOALS: "Everything is really up top so I don't really know about PT."  OBJECTIVE:  Note: Objective measures were completed at Evaluation unless otherwise noted.  DIAGNOSTIC FINDINGS:  MR Brain 01/08/2024: IMPRESSION: Acute lacunar infarct in the left thalamus.  COGNITION: Overall cognitive status:  pt reports some difficulty with memory/losing her place with tasks due to focus   SENSATION: Light touch: WFL - pt reports decreased R > L  COORDINATION: LE RAMS;  impaired Heel-to-shin:  WNL  bilaterally  EDEMA:  WNL  MUSCLE TONE: Difficulty discerning flexion spasticity due to pt difficulty relaxing  POSTURE: rounded shoulders and forward head  LOWER EXTREMITY ROM:     Active  Right Eval Left Eval  Hip flexion WNL WNL  Hip extension    Hip abduction    Hip adduction    Hip internal rotation    Hip external rotation    Knee flexion " "  Knee extension " "  Ankle dorsiflexion " "  Ankle plantarflexion    Ankle inversion    Ankle eversion     (Blank rows = not tested)  LOWER EXTREMITY MMT:    MMT Right Eval Left Eval  Hip flexion 4-/5 4+/5  Hip extension    Hip abduction    Hip adduction    Hip internal rotation    Hip external rotation    Knee flexion 4-/5 4+/5  Knee extension 4/5 5/5  Ankle dorsiflexion 4+/5 5/5  Ankle plantarflexion    Ankle inversion    Ankle eversion    (Blank rows = not tested)  BED MOBILITY:  Pt sleeps in standard bed and requires increased time and momentum to get roll and has difficulty managing bedding  TRANSFERS: Assistive device utilized: None  Sit to stand: SBA Stand to sit: SBA Chair to chair: SBA  GAIT: Gait pattern:  bilateral ER, step through pattern, decreased stride length, decreased hip/knee flexion- Right, decreased hip/knee flexion- Left, decreased ankle dorsiflexion- Right, decreased ankle dorsiflexion- Left, trendelenburg, and wide BOS Distance walked: various clinic distances Assistive device utilized: None Level of assistance: SBA Comments: Pt has severe drift from pathway to left then right when entering clinic, increased time to correct path with cuing.  Truncal ataxia?  FUNCTIONAL TESTS:  5 times sit to stand: 15.13 sec w/ light BUE support, severe hyperextension in standing, mild weight shift left during sitting Timed up and go (TUG): 11.28 sec SBA, no instability in turning with small radius 10 meter walk test: 8.50 sec = 1.18 m/sec OR 3.88 ft/sec Functional gait assessment: To be  assessed.  PATIENT SURVEYS:  None completed due to time.                                                                                                                              TREATMENT DATE: 01/26/2024    PATIENT EDUCATION: Education details: PT POC, assessments used and to be used, and goals to be set.  Discussed stroke support group and her reaching out to care team for psychologic support as previously discussed with MD as pt remains discouraged about her loss of independence.  Discussed OT and ST referral for other deficits noted during eval - pt declined ST at this time but is very open to OT referral.  We discussed anatomy of stroke and common deficits seen relative to thalamic stroke and goals of therapy to management sensation changes/pain related to CVA. Person educated: Patient Education method: Explanation Education comprehension: verbalized understanding and needs further education  HOME EXERCISE PROGRAM: To be established.  GOALS: Goals reviewed with patient? Yes  SHORT TERM GOALS: Target date: 02/25/2024  Pt will be  independent and compliant with initial strength and balance focused HEP in order to maintain functional progress and improve mobility. Baseline:  To be established. Goal status: INITIAL  2.  Pt will decrease 5xSTS to </=12 seconds w/ improved upright neutral in order to demonstrate decreased risk for falls and improved functional bilateral LE strength and power. Baseline: 15.13 sec w/ light BUE support and severe lumbar hyperextension on upright Goal status: INITIAL  LONG TERM GOALS: Target date: 03/24/2024  Pt will be independent and compliant with advanced and finalized strength and balance focused HEP in order to maintain functional progress and improve mobility. Baseline: To be established. Goal status: INITIAL  2.  Pt will demonstrate a gait speed of >/=4.08 feet/sec in order to decrease risk for falls. Baseline: 3.88 ft/sec Goal status:  INITIAL  3.  Pt will ambulate >/=500 feet with LRAD at no more than mod I level of assist over unlevel/compliant surfaces, curb step and ramp with improved trunk stability to promote household and community access. Baseline: instability over level ground/SBA Goal status: INITIAL  4.  FGA to be assessed with goal set as appropriate. Baseline: To be assessed. Goal status: INITIAL  ASSESSMENT:  CLINICAL IMPRESSION: Patient is a 48 y.o. female who was seen today for physical therapy evaluation and treatment for right hemibody deficits following left thalamic infarct.  Pt has a significant PMH of HTN, anxiety, depression, fibromyalgia, herpes zoster, DM2, statin intolerance, thalamic CVA, and CKD.  She initially presented to the hospital on 3/14 following wooziness and reported right sided numbness and weakness.  Identified impairments include dysdiadochokinesia, worse proximal right LE weakness, decreased right hemibody sensation, and gait instability with difficulty maintaining pathway.  Pt has moderate RUE impairment impacting quality of life since CVA so PT to request OT evaluation and treatment.  Pt declined ST for cognitive changes as she feels she is working through these.  She is having some difficulty adapting to the changes since her stroke and was encouraged to further seek support from stroke support group and psychology/therapy referral as needed.  Evaluation via the following assessment tools: 5xSTS and indicate low fall risk, but PT has concerns about truncal instability and gait impairments impacting this as well.  Will further assess fall risk and dynamic instability via FGA at next session.  She would benefit from skilled PT to address impairments as noted and progress towards long term goals.  OBJECTIVE IMPAIRMENTS: Abnormal gait, decreased activity tolerance, decreased balance, decreased coordination, decreased knowledge of condition, decreased knowledge of use of DME, decreased  strength, increased muscle spasms, impaired sensation, improper body mechanics, postural dysfunction, and pain.   ACTIVITY LIMITATIONS: carrying, lifting, bending, standing, squatting, stairs, transfers, bed mobility, continence, bathing, dressing, self feeding, reach over head, and locomotion level  PARTICIPATION LIMITATIONS: meal prep, cleaning, laundry, driving, community activity, and occupation  PERSONAL FACTORS: Fitness, Past/current experiences, Transportation, and 3+ comorbidities: HTN, fibromyalgia, DM2  are also affecting patient's functional outcome.   REHAB POTENTIAL: Good  CLINICAL DECISION MAKING: Evolving/moderate complexity  EVALUATION COMPLEXITY: Moderate  PLAN:  PT FREQUENCY: 1x/week  PT DURATION: 8 weeks  PLANNED INTERVENTIONS: 97164- PT Re-evaluation, 97110-Therapeutic exercises, 97530- Therapeutic activity, 97112- Neuromuscular re-education, 97535- Self Care, 16109- Manual therapy, [phone]- Gait training, [phone]- Orthotic Fit/training, [phone]- Aquatic Therapy, [phone]- Electrical stimulation (manual), Patient/Family education, Balance training, Stair training, Taping, Dry Needling, Joint mobilization, Spinal mobilization, Vestibular training, DME instructions, Cryotherapy, and Moist heat  PLAN FOR NEXT SESSION: ASSESS FGA - set LTG.  Initial HEP for  strength and balance.  Bed mobility for improved efficiency.  Gait training.  STS - improve neutral upright/truncal ataxia?     Sadie Haber, PT, DPT 01/26/2024, 1:10 PM

## 2024-01-31 ENCOUNTER — Ambulatory Visit (INDEPENDENT_AMBULATORY_CARE_PROVIDER_SITE_OTHER): Admitting: Adult Health

## 2024-01-31 ENCOUNTER — Encounter: Payer: Self-pay | Admitting: Adult Health

## 2024-01-31 VITALS — BP 142/90 | HR 83 | Ht 63.0 in | Wt 169.0 lb

## 2024-01-31 DIAGNOSIS — I69319 Unspecified symptoms and signs involving cognitive functions following cerebral infarction: Secondary | ICD-10-CM | POA: Diagnosis not present

## 2024-01-31 DIAGNOSIS — I6381 Other cerebral infarction due to occlusion or stenosis of small artery: Secondary | ICD-10-CM | POA: Diagnosis not present

## 2024-01-31 DIAGNOSIS — R29818 Other symptoms and signs involving the nervous system: Secondary | ICD-10-CM | POA: Diagnosis not present

## 2024-01-31 DIAGNOSIS — G8191 Hemiplegia, unspecified affecting right dominant side: Secondary | ICD-10-CM | POA: Diagnosis not present

## 2024-01-31 NOTE — Progress Notes (Signed)
 I agree with the above plan

## 2024-01-31 NOTE — Patient Instructions (Addendum)
 Continue physical therapy, referral will be placed to start occupational and speech (cognitive) therapy, you will be called to schedule  Continue aspirin 81 mg daily for secondary stroke prevention  Continue to follow up with PCP, endocrinology and cardiology regarding blood pressure, cholesterol and diabetes management  Maintain strict control of hypertension with blood pressure goal below 130/90, diabetes with hemoglobin A1c goal below 7.0 % and cholesterol with LDL cholesterol (bad cholesterol) goal below 70 mg/dL.   Signs of a Stroke? Follow the BEFAST method:  Balance Watch for a sudden loss of balance, trouble with coordination or vertigo Eyes Is there a sudden loss of vision in one or both eyes? Or double vision?  Face: Ask the person to smile. Does one side of the face droop or is it numb?  Arms: Ask the person to raise both arms. Does one arm drift downward? Is there weakness or numbness of a leg? Speech: Ask the person to repeat a simple phrase. Does the speech sound slurred/strange? Is the person confused ? Time: If you observe any of these signs, call 911.        Thank you for coming to see [place] at [facility]. I hope we have been able to provide you high quality care today.  You may receive a patient satisfaction survey over the next few weeks. We would appreciate your feedback and comments so that we may continue to improve ourselves and the health of our patients.

## 2024-02-01 ENCOUNTER — Telehealth: Payer: Self-pay | Admitting: Licensed Clinical Social Worker

## 2024-02-01 NOTE — Telephone Encounter (Signed)
 H&V Care Navigation CSW Progress Note  Clinical Social Worker contacted patient by phone to f/u on community resource information provided and mailed to pt. Was able to reach her today at [phone]. Pt shares she did receive information in the mail and does not have any additional questions at this time. Encouraged her to call our team as needed for any resources or ongoing support as needed.  Patient is participating in a Managed Medicaid Plan:  No, UHC medicare and medicaid  SDOH Screenings   Food Insecurity: Food Insecurity Present (01/25/2024)  Housing: High Risk (01/25/2024)  Transportation Needs: No Transportation Needs (01/25/2024)  Utilities: Not At Risk (01/25/2024)  Alcohol Screen: Low Risk  (01/25/2024)  Depression (PHQ2-9): High Risk (01/25/2024)  Financial Resource Strain: Low Risk  (01/25/2024)  Physical Activity: Insufficiently Active (01/25/2024)  Social Connections: Socially Isolated (01/25/2024)  Stress: Stress Concern Present (01/25/2024)  Tobacco Use: Medium Risk (01/31/2024)  Health Literacy: Adequate Health Literacy (01/25/2024)    Octavio Graves, MSW, LCSW Clinical Social Worker II [facility] Heart/Vascular Care Navigation  [phone]- work cell phone (preferred) [phone]- desk phone

## 2024-02-04 ENCOUNTER — Ambulatory Visit: Admitting: Physical Therapy

## 2024-02-04 ENCOUNTER — Encounter: Payer: Self-pay | Admitting: Physical Therapy

## 2024-02-04 VITALS — BP 138/99 | HR 75

## 2024-02-04 DIAGNOSIS — G8191 Hemiplegia, unspecified affecting right dominant side: Secondary | ICD-10-CM | POA: Diagnosis not present

## 2024-02-04 DIAGNOSIS — M6281 Muscle weakness (generalized): Secondary | ICD-10-CM | POA: Diagnosis not present

## 2024-02-04 DIAGNOSIS — I69351 Hemiplegia and hemiparesis following cerebral infarction affecting right dominant side: Secondary | ICD-10-CM

## 2024-02-04 DIAGNOSIS — R2681 Unsteadiness on feet: Secondary | ICD-10-CM | POA: Diagnosis not present

## 2024-02-04 DIAGNOSIS — R2689 Other abnormalities of gait and mobility: Secondary | ICD-10-CM | POA: Diagnosis not present

## 2024-02-04 DIAGNOSIS — I639 Cerebral infarction, unspecified: Secondary | ICD-10-CM | POA: Diagnosis not present

## 2024-02-04 NOTE — Therapy (Signed)
 OUTPATIENT PHYSICAL THERAPY NEURO TREATMENT   Patient Name: Linah Klapper MRN: [id number] DOB:1976-03-21, 48 y.o., female Today's Date: 02/04/2024   PCP: Hoy Register, MD REFERRING PROVIDER: Anders Simmonds, PA-C  END OF SESSION:  PT End of Session - 02/04/24 1323     Visit Number 2    Number of Visits 9   8 + eval   Date for PT Re-Evaluation 03/31/24   pushed out due to future multi-disciplinary needs   Authorization Type [employer] dual complete    PT Start Time 1316    PT Stop Time 1356    PT Time Calculation (min) 40 min    Equipment Utilized During Treatment Gait belt    Activity Tolerance Patient tolerated treatment well    Behavior During Therapy WFL for tasks assessed/performed             Past Medical History:  Diagnosis Date   Abnormal Pap smear of cervix    yrs ago   Adenomyosis    Allergy    Anemia    Anxiety    Aortic atherosclerosis (HCC)    Arthritis    Asthma    Chronic UTI    Clostridium difficile infection    Depression    Diabetes mellitus without complication (HCC) 10/22/2023   A1C 8.8   Diverticulosis    Endometriosis    Fibroid    Fibromyalgia    GERD (gastroesophageal reflux disease)    Hiatal hernia    Hyperlipidemia    Hypertension    Internal hemorrhoids    Meningitis, viral    Migraines    Pancreatitis    Pure hypercholesterolemia 12/23/2020   Tubular adenoma of colon    Past Surgical History:  Procedure Laterality Date   APPENDECTOMY  1990   CERVICAL BIOPSY  W/ LOOP ELECTRODE EXCISION     LEEP   CESAREAN SECTION  2002   CHOLECYSTECTOMY  2011   COLONOSCOPY     UPPER GASTROINTESTINAL ENDOSCOPY     URINARY SURGERY     urethra sling, removal, and then revision 2016 (4 surgeries)   WISDOM TOOTH EXTRACTION     Patient Active Problem List   Diagnosis Date Noted   CVA (cerebral vascular accident) (HCC) 01/08/2024   Leukocytosis 01/08/2024   Type 2 diabetes mellitus with chronic kidney disease, with long-term current  use of insulin (HCC) 01/08/2024   Hyperlipidemia 01/08/2024   GERD (gastroesophageal reflux disease) 01/08/2024   Overweight (BMI 25.0-29.9) 01/08/2024   Moderately severe major depression (HCC) 01/06/2024   Yeast vaginitis 08/30/2023   Iron deficiency anemia 03/11/2023   PMDD (premenstrual dysphoric disorder) 11/17/2022   Abnormal uterine bleeding (AUB) 11/17/2022   Statin myopathy 09/23/2022   Steatorrhea 08/17/2022   Irregular menses 11/28/2021   Perimenopause 11/28/2021   Episode of recurrent major depressive disorder (HCC) 04/09/2021   BMI 33.0-33.9,adult 04/09/2021   Pure hypercholesterolemia 12/23/2020   Menorrhagia with regular cycle 09/13/2020   Well woman exam 09/13/2020   History of herpes zoster 01/17/2018   Overactive bladder 12/28/2017   Recurrent infections 11/19/2017   Cyst of nasal cavity 11/19/2017   Angio-edema 11/19/2017   Abdominal pain, epigastric 11/04/2017   Dysphagia 11/04/2017   Gastroesophageal reflux disease 07/12/2017   Degenerative disc disease at L5-S1 level 05/14/2017   Fibromyalgia 05/14/2017   ADD (attention deficit disorder) 05/14/2017   Urinary incontinence 04/05/2017   Thigh pain 02/02/2017   Anxiety and depression 02/02/2017   Tobacco abuse 02/02/2017   Facial flushing 02/02/2017  Hypertension 10/23/2016   Hidradenitis 10/23/2016   Diabetes (HCC) 09/12/2014   Lupus anticoagulant positive 07/03/2014   Vitamin D deficiency 06/06/2014    ONSET DATE: 01/07/2024 (Lacunar CVA)  REFERRING DIAG: G81.91 (ICD-10-CM) - Right hemiparesis (HCC) I63.9 (ICD-10-CM) - Cerebrovascular accident (CVA), unspecified mechanism (HCC)  THERAPY DIAG:  Other abnormalities of gait and mobility  Muscle weakness (generalized)  Unsteadiness on feet  Hemiplegia and hemiparesis following cerebral infarction affecting right dominant side (HCC)  Rationale for Evaluation and Treatment: Rehabilitation  SUBJECTIVE:                                                                                                                                                                                              SUBJECTIVE STATEMENT: Pt reports she remains with numbness down her right side.  She ate salty foods this morning and took a muscle relaxer at 9am. Pt accompanied by: self (family dropped her off)  PERTINENT HISTORY: HTN, anxiety, depression, fibromyalgia, herpes zoster, DM2, statin intolerance, thalamic CVA, CKD  PAIN:  Are you having pain? Yes: NPRS scale: 7-8 Pain location: right arm (mostly) Pain description: cramping/stiffness in right hemibody Aggravating factors: using the arm, laying on the right Relieving factors: sitting still  PRECAUTIONS: Fall  RED FLAGS: Bowel or bladder incontinence: Yes: urinary incontinence - wearing depends today and MD aware per report    WEIGHT BEARING RESTRICTIONS: No  FALLS: Has patient fallen in last 6 months? No  LIVING ENVIRONMENT: Lives with: lives with an adult companion Lives in: House/apartment Stairs: Yes: External: 3 steps; none Has following equipment at home: Grab bars  PLOF: Independent with gait, Independent with transfers, Needs assistance with ADLs, and Needs assistance with homemaking  PATIENT GOALS: "Everything is really up top so I don't really know about PT."  OBJECTIVE:  Note: Objective measures were completed at Evaluation unless otherwise noted.  DIAGNOSTIC FINDINGS:  MR Brain 01/08/2024: IMPRESSION: Acute lacunar infarct in the left thalamus.  COGNITION: Overall cognitive status:  pt reports some difficulty with memory/losing her place with tasks due to focus   SENSATION: Light touch: WFL - pt reports decreased R > L  COORDINATION: LE RAMS;  impaired Heel-to-shin:  WNL bilaterally  EDEMA:  WNL  MUSCLE TONE: Difficulty discerning flexion spasticity due to pt difficulty relaxing  POSTURE: rounded shoulders and forward head  LOWER EXTREMITY ROM:     Active   Right Eval Left Eval  Hip flexion WNL WNL  Hip extension    Hip abduction    Hip adduction    Hip internal rotation    Hip external rotation    Knee  flexion " "  Knee extension " "  Ankle dorsiflexion " "  Ankle plantarflexion    Ankle inversion    Ankle eversion     (Blank rows = not tested)  LOWER EXTREMITY MMT:    MMT Right Eval Left Eval  Hip flexion 4-/5 4+/5  Hip extension    Hip abduction    Hip adduction    Hip internal rotation    Hip external rotation    Knee flexion 4-/5 4+/5  Knee extension 4/5 5/5  Ankle dorsiflexion 4+/5 5/5  Ankle plantarflexion    Ankle inversion    Ankle eversion    (Blank rows = not tested)  BED MOBILITY:  Pt sleeps in standard bed and requires increased time and momentum to get roll and has difficulty managing bedding  TRANSFERS: Assistive device utilized: None  Sit to stand: SBA Stand to sit: SBA Chair to chair: SBA  GAIT: Gait pattern:  bilateral ER, step through pattern, decreased stride length, decreased hip/knee flexion- Right, decreased hip/knee flexion- Left, decreased ankle dorsiflexion- Right, decreased ankle dorsiflexion- Left, trendelenburg, and wide BOS Distance walked: various clinic distances Assistive device utilized: None Level of assistance: SBA Comments: Pt has severe drift from pathway to left then right when entering clinic, increased time to correct path with cuing.  Truncal ataxia?  FUNCTIONAL TESTS:  5 times sit to stand: 15.13 sec w/ light BUE support, severe hyperextension in standing, mild weight shift left during sitting Timed up and go (TUG): 11.28 sec SBA, no instability in turning with small radius 10 meter walk test: 8.50 sec = 1.18 m/sec OR 3.88 ft/sec Functional gait assessment: To be assessed.  PATIENT SURVEYS:  None completed due to time.                                                                                                                              TREATMENT DATE:  02/04/2024  LUE IN SITTING PRIOR TO INTERVENTIONS: Vitals:   02/04/24 1320  BP: (!) 138/99  Pulse: 75   -FGA:  [facility] PT Assessment - 02/04/24 1329       Functional Gait  Assessment   Gait assessed  Yes    Gait Level Surface Walks 20 ft, slow speed, abnormal gait pattern, evidence for imbalance or deviates 10-15 in outside of the 12 in walkway width. Requires more than 7 sec to ambulate 20 ft.    Change in Gait Speed Makes only minor adjustments to walking speed, or accomplishes a change in speed with significant gait deviations, deviates 10-15 in outside the 12 in walkway width, or changes speed but loses balance but is able to recover and continue walking.    Gait with Horizontal Head Turns Performs head turns smoothly with slight change in gait velocity (eg, minor disruption to smooth gait path), deviates 6-10 in outside 12 in walkway width, or uses an assistive device.    Gait with Vertical Head Turns Performs task  with slight change in gait velocity (eg, minor disruption to smooth gait path), deviates 6 - 10 in outside 12 in walkway width or uses assistive device    Gait and Pivot Turn Pivot turns safely in greater than 3 sec and stops with no loss of balance, or pivot turns safely within 3 sec and stops with mild imbalance, requires small steps to catch balance.    Step Over Obstacle Is able to step over one shoe box (4.5 in total height) but must slow down and adjust steps to clear box safely. May require verbal cueing.    Gait with Narrow Base of Support Ambulates less than 4 steps heel to toe or cannot perform without assistance.    Gait with Eyes Closed Cannot walk 20 ft without assistance, severe gait deviations or imbalance, deviates greater than 15 in outside 12 in walkway width or will not attempt task.    Ambulating Backwards Walks 20 ft, uses assistive device, slower speed, mild gait deviations, deviates 6-10 in outside 12 in walkway width.    Steps Alternating feet, must use rail.     Total Score 13    FGA comment: 13/30 = significant fall risk            Initial HEP: Exercises - Sit to Stand with Armchair  - 1 x daily - 7 x weekly - 3 sets - 10 reps - Corner Balance Feet Together With Eyes Open  - 1 x daily - 5 x weekly - 1 sets - 3-4 reps - 30 seconds hold - Corner Balance Feet Together With Eyes Closed  - 1 x daily - 5 x weekly - 1 sets - 3-4 reps - 30 seconds hold - Corner Balance Feet Together: Eyes Open With Head Turns  - 1 x daily - 5 x weekly - 1 sets - 3-4 reps - 30 seconds hold - Romberg Stance with Head Nods  - 1 x daily - 5 x weekly - 2-3 sets - 10 reps - Tandem Walking with Counter Support  - 1 x daily - 5 x weekly - 3 sets - 10 reps  PATIENT EDUCATION: Education details: BP monitoring at home/logging if able.  Discussed impact of salty foods and smoking on BP - pt states she lives with a smoker, but has only smoked 1-2 cigarettes herself since the stroke.  BP limits for therapy/exercise.  Outcome interpretations and goal set.  Initial HEP. Person educated: Patient Education method: Explanation Education comprehension: verbalized understanding and needs further education  HOME EXERCISE PROGRAM: Access Code: [email or web address] URL: [email or web address] Date: 02/04/2024 Prepared by: Camille Bal  Exercises - Sit to Stand with Armchair  - 1 x daily - 7 x weekly - 3 sets - 10 reps - Corner Balance Feet Together With Eyes Open  - 1 x daily - 5 x weekly - 1 sets - 3-4 reps - 30 seconds hold - Corner Balance Feet Together With Eyes Closed  - 1 x daily - 5 x weekly - 1 sets - 3-4 reps - 30 seconds hold - Corner Balance Feet Together: Eyes Open With Head Turns  - 1 x daily - 5 x weekly - 1 sets - 3-4 reps - 30 seconds hold - Romberg Stance with Head Nods  - 1 x daily - 5 x weekly - 2-3 sets - 10 reps - Tandem Walking with Counter Support  - 1 x daily - 5 x weekly - 3 sets - 10 reps  GOALS: Goals reviewed with  patient? Yes  SHORT TERM  GOALS: Target date: 02/25/2024  Pt will be independent and compliant with initial strength and balance focused HEP in order to maintain functional progress and improve mobility. Baseline:  To be established. Goal status: INITIAL  2.  Pt will decrease 5xSTS to </=12 seconds w/ improved upright neutral in order to demonstrate decreased risk for falls and improved functional bilateral LE strength and power. Baseline: 15.13 sec w/ light BUE support and severe lumbar hyperextension on upright Goal status: INITIAL  LONG TERM GOALS: Target date: 03/24/2024  Pt will be independent and compliant with advanced and finalized strength and balance focused HEP in order to maintain functional progress and improve mobility. Baseline: To be established. Goal status: INITIAL  2.  Pt will demonstrate a gait speed of >/=4.08 feet/sec in order to decrease risk for falls. Baseline: 3.88 ft/sec Goal status: INITIAL  3.  Pt will ambulate >/=500 feet with LRAD at no more than mod I level of assist over unlevel/compliant surfaces, curb step and ramp with improved trunk stability to promote household and community access. Baseline: instability over level ground/SBA Goal status: INITIAL  4.  Pt will improve FGA score to >/=19/30 in order to demonstrate improved balance and decreased fall risk. Baseline: 13/30 (4/11) Goal status: INITIAL  ASSESSMENT:  CLINICAL IMPRESSION: Focus of skilled session today assessing metric not captured on eval.  Pt in high fall risk category scoring 13/30 on FGA with most difficulty with narrowed BOS and visually limited conditions.  Established introductory HEP to address these deficits with up-weighting of vestibular input.  PT to continue per POC to address lingering motor control and strength deficits as able.  OBJECTIVE IMPAIRMENTS: Abnormal gait, decreased activity tolerance, decreased balance, decreased coordination, decreased knowledge of condition, decreased knowledge of use  of DME, decreased strength, increased muscle spasms, impaired sensation, improper body mechanics, postural dysfunction, and pain.   ACTIVITY LIMITATIONS: carrying, lifting, bending, standing, squatting, stairs, transfers, bed mobility, continence, bathing, dressing, self feeding, reach over head, and locomotion level  PARTICIPATION LIMITATIONS: meal prep, cleaning, laundry, driving, community activity, and occupation  PERSONAL FACTORS: Fitness, Past/current experiences, Transportation, and 3+ comorbidities: HTN, fibromyalgia, DM2  are also affecting patient's functional outcome.   REHAB POTENTIAL: Good  CLINICAL DECISION MAKING: Evolving/moderate complexity  EVALUATION COMPLEXITY: Moderate  PLAN:  PT FREQUENCY: 1x/week  PT DURATION: 8 weeks  PLANNED INTERVENTIONS: 97164- PT Re-evaluation, 97110-Therapeutic exercises, 97530- Therapeutic activity, 97112- Neuromuscular re-education, 97535- Self Care, 76283- Manual therapy, [phone]- Gait training, [phone]- Orthotic Fit/training, [phone]- Aquatic Therapy, [phone]- Electrical stimulation (manual), Patient/Family education, Balance training, Stair training, Taping, Dry Needling, Joint mobilization, Spinal mobilization, Vestibular training, DME instructions, Cryotherapy, and Moist heat  PLAN FOR NEXT SESSION: How is HEP for strength and balance?  Bed mobility for improved efficiency.  Gait training.  Staggered squats.  Leg press, weighted vest w/ hurdles/dynamic stability tasks.  RLE NMR.   Sadie Haber, PT, DPT 02/04/2024, 1:59 PM

## 2024-02-07 ENCOUNTER — Encounter: Payer: Self-pay | Admitting: Adult Health

## 2024-02-08 ENCOUNTER — Encounter: Payer: Self-pay | Admitting: Neurology

## 2024-02-08 ENCOUNTER — Ambulatory Visit (INDEPENDENT_AMBULATORY_CARE_PROVIDER_SITE_OTHER): Admitting: "Endocrinology

## 2024-02-08 ENCOUNTER — Encounter: Payer: Self-pay | Admitting: Adult Health

## 2024-02-08 ENCOUNTER — Inpatient Hospital Stay: Payer: 59 | Attending: Adult Health | Admitting: Adult Health

## 2024-02-08 ENCOUNTER — Inpatient Hospital Stay

## 2024-02-08 ENCOUNTER — Encounter: Payer: Self-pay | Admitting: "Endocrinology

## 2024-02-08 VITALS — BP 150/86 | HR 77 | Resp 18 | Ht 63.0 in | Wt 168.5 lb

## 2024-02-08 VITALS — BP 120/80 | HR 76 | Ht 63.0 in | Wt 169.0 lb

## 2024-02-08 DIAGNOSIS — D509 Iron deficiency anemia, unspecified: Secondary | ICD-10-CM | POA: Diagnosis not present

## 2024-02-08 DIAGNOSIS — Z7984 Long term (current) use of oral hypoglycemic drugs: Secondary | ICD-10-CM

## 2024-02-08 DIAGNOSIS — E538 Deficiency of other specified B group vitamins: Secondary | ICD-10-CM | POA: Insufficient documentation

## 2024-02-08 DIAGNOSIS — E1165 Type 2 diabetes mellitus with hyperglycemia: Secondary | ICD-10-CM

## 2024-02-08 DIAGNOSIS — Z87891 Personal history of nicotine dependence: Secondary | ICD-10-CM | POA: Diagnosis not present

## 2024-02-08 DIAGNOSIS — Z8673 Personal history of transient ischemic attack (TIA), and cerebral infarction without residual deficits: Secondary | ICD-10-CM | POA: Diagnosis not present

## 2024-02-08 DIAGNOSIS — Z794 Long term (current) use of insulin: Secondary | ICD-10-CM

## 2024-02-08 DIAGNOSIS — D500 Iron deficiency anemia secondary to blood loss (chronic): Secondary | ICD-10-CM

## 2024-02-08 DIAGNOSIS — E782 Mixed hyperlipidemia: Secondary | ICD-10-CM | POA: Diagnosis not present

## 2024-02-08 LAB — CMP (CANCER CENTER ONLY)
ALT: 13 U/L (ref 0–44)
AST: 12 U/L — ABNORMAL LOW (ref 15–41)
Albumin: 4.3 g/dL (ref 3.5–5.0)
Alkaline Phosphatase: 52 U/L (ref 38–126)
Anion gap: 5 (ref 5–15)
BUN: 18 mg/dL (ref 6–20)
CO2: 29 mmol/L (ref 22–32)
Calcium: 9.7 mg/dL (ref 8.9–10.3)
Chloride: 102 mmol/L (ref 98–111)
Creatinine: 0.97 mg/dL (ref 0.44–1.00)
GFR, Estimated: >60 mL/min (ref >60)
Glucose, Bld: 120 mg/dL — ABNORMAL HIGH (ref 70–99)
Potassium: 4.6 mmol/L (ref 3.5–5.1)
Sodium: 136 mmol/L (ref 135–145)
Total Bilirubin: 0.4 mg/dL (ref 0.0–1.2)
Total Protein: 7.7 g/dL (ref 6.5–8.1)

## 2024-02-08 LAB — CBC WITH DIFFERENTIAL (CANCER CENTER ONLY)
Abs Immature Granulocytes: 0.04 10*3/uL (ref 0.00–0.07)
Basophils Absolute: 0.1 10*3/uL (ref 0.0–0.1)
Basophils Relative: 1 %
Eosinophils Absolute: 0.1 10*3/uL (ref 0.0–0.5)
Eosinophils Relative: 1 %
HCT: 43.6 % (ref 36.0–46.0)
Hemoglobin: 15.2 g/dL — ABNORMAL HIGH (ref 12.0–15.0)
Immature Granulocytes: 0 %
Lymphocytes Relative: 23 %
Lymphs Abs: 2.5 10*3/uL (ref 0.7–4.0)
MCH: 31.1 pg (ref 26.0–34.0)
MCHC: 34.9 g/dL (ref 30.0–36.0)
MCV: 89.3 fL (ref 80.0–100.0)
Monocytes Absolute: 0.6 10*3/uL (ref 0.1–1.0)
Monocytes Relative: 6 %
Neutro Abs: 7.3 10*3/uL (ref 1.7–7.7)
Neutrophils Relative %: 69 %
Platelet Count: 316 10*3/uL (ref 150–400)
RBC: 4.88 MIL/uL (ref 3.87–5.11)
RDW: 13.7 % (ref 11.5–15.5)
WBC Count: 10.6 10*3/uL — ABNORMAL HIGH (ref 4.0–10.5)
nRBC: 0 % (ref 0.0–0.2)

## 2024-02-08 LAB — VITAMIN B12: Vitamin B-12: 218 pg/mL (ref 180–914)

## 2024-02-08 LAB — IRON AND IRON BINDING CAPACITY (CC-WL,HP ONLY)
Iron: 69 ug/dL (ref 28–170)
Saturation Ratios: 16 % (ref 10.4–31.8)
TIBC: 424 ug/dL (ref 250–450)
UIBC: 355 ug/dL (ref 148–442)

## 2024-02-08 LAB — FERRITIN: Ferritin: 185 ng/mL (ref 11–307)

## 2024-02-08 MED ORDER — SEMAGLUTIDE (1 MG/DOSE) 4 MG/3ML ~~LOC~~ SOPN: 1.0000 mg | PEN_INJECTOR | SUBCUTANEOUS | 0 refills | Status: DC

## 2024-02-08 NOTE — Progress Notes (Signed)
 Outpatient Endocrinology Note Jorge Newcomer, MD  02/08/24   Judah North [birth year] [id number]  Referring Provider: Joaquin Mulberry, MD Primary Care Provider: Joaquin Mulberry, MD Reason for consultation: Subjective   Assessment & Plan  Diagnoses and all orders for this visit:  Uncontrolled type 2 diabetes mellitus with hyperglycemia (HCC)  Long term (current) use of oral hypoglycemic drugs  Long-term insulin use (HCC)  Mixed hypercholesterolemia and hypertriglyceridemia  Other orders -     Semaglutide, 1 MG/DOSE, 4 MG/3ML SOPN; Inject 1 mg as directed once a week.   Diabetes Type II complicated by hyperglycemia,  Lab Results  Component Value Date   GFR 106.09 02/20/2022   Hba1c goal less than 7, current Hba1c is  Lab Results  Component Value Date   HGBA1C 6.4 (H) 01/08/2024   Will recommend the following: Lantus 24 units qpm (decrease by 2 units if BG is less than 70) Metformin 500mg  2 tabs bid Ozempic 1 mg per week  Humalog scale: Use it 15 min before you eat: Space it out by 4 hours.  201 - 225: 3 units 226 - 250: 4 units 251 - 275: 5 units 276 - 300: 6 units 301 - 325: 7 units 326 - 350: 8 units 351 - 375: 9 units 376 - 400: 10 units   Ordered DM education previously   No known contraindications/side effects to any of above medications No history of MEN syndrome/medullary thyroid cancer/pancreatitis or pancreatic cancer in self or family Stopped ozempic 2mg  3 mo ago due to weight loss-lost 30-40 lbs  Will attempt max dose of ozempic at 1 mg to avoid excess weight loss   -Last LD and Tg are as follows: Lab Results  Component Value Date   LDLCALC 175 (H) 01/08/2024    Lab Results  Component Value Date   TRIG 165 (H) 01/08/2024   -not on statin, not interested after detailed discussion, says she has the shot approved, scared to take it-counseled pt -Follow low fat diet and exercise   -Blood pressure goal <140/90 -  Microalbumin/creatinine goal is < 30 -Last MA/Cr is as follows: Lab Results  Component Value Date   MICROALBUR 1.3 11/22/2023   -not on ACE/ARB  -diet changes including salt restriction -limit eating outside -counseled BP targets per standards of diabetes care -uncontrolled blood pressure can lead to retinopathy, nephropathy and cardiovascular and atherosclerotic heart disease  Reviewed and counseled on: -A1C target -Blood sugar targets -Complications of uncontrolled diabetes  -Checking blood sugar before meals and bedtime and bring log next visit -All medications with mechanism of action and side effects -Hypoglycemia management: rule of 15's, Glucagon Emergency Kit and medical alert ID -low-carb low-fat plate-method diet -At least 20 minutes of physical activity per day -Annual dilated retinal eye exam and foot exam -compliance and follow up needs -follow up as scheduled or earlier if problem gets worse  Call if blood sugar is less than 70 or consistently above 250    Take a 15 gm snack of carbohydrate at bedtime before you go to sleep if your blood sugar is less than 100.    If you are going to fast after midnight for a test or procedure, ask your physician for instructions on how to reduce/decrease your insulin dose.    Call if blood sugar is less than 70 or consistently above 250  -Treating a low sugar by rule of 15  (15 gms of sugar every 15 min until sugar is more than 70) If  you feel your sugar is low, test your sugar to be sure If your sugar is low (less than 70), then take 15 grams of a fast acting Carbohydrate (3-4 glucose tablets or glucose gel or 4 ounces of juice or regular soda) Recheck your sugar 15 min after treating low to make sure it is more than 70 If sugar is still less than 70, treat again with 15 grams of carbohydrate          Don't drive the hour of hypoglycemia  If unconscious/unable to eat or drink by mouth, use glucagon injection or nasal spray  baqsimi and call 911. Can repeat again in 15 min if still unconscious.  Return in about 3 months (around 05/09/2024).   I have reviewed current medications, nurse's notes, allergies, vital signs, past medical and surgical history, family medical history, and social history for this encounter. Counseled patient on symptoms, examination findings, lab findings, imaging results, treatment decisions and monitoring and prognosis. The patient understood the recommendations and agrees with the treatment plan. All questions regarding treatment plan were fully answered.  Altamese Ogden Dunes, MD  02/08/24  History of Present Illness Denea Cheaney is a 48 y.o. year old female who presents for follow up of Type II diabetes mellitus.  Memori Sammon was first diagnosed in around 2015.   Diabetes education +  Works at night 4 pm -7 am Sleeps [place]-5am, 8:30am-11:30am  8am is BF, lunch is around noon, dinner at 5:30pm and 9 pm is snack   Home diabetes regimen: Lantus 24 units qpm Metformin 500mg  2 tabs bid Ozempic at 0.5 mg per week  Humalog scale: Use it 15 min before you eat: Space it out by 4 hours.  151 - 175: 1 unit 176 - 200: 2 units 201 - 225: 3 units 226 - 250: 4 units 251 - 275: 5 units 276 - 300: 6 units 301 - 325: 7 units 326 - 350: 8 units 351 - 375: 9 units 376 - 400: 10 units   Stopped ozempic 2mg  3 mo ago due to weight loss-lost 30-40 lbs   COMPLICATIONS -  MI/Stroke -  retinopathy -  neuropathy -  nephropathy  SYMPTOMS REVIEWED + Polyuria - Weight loss + Blurred vision  BLOOD SUGAR DATA  CGM interpretation: At today's visit, we reviewed her CGM downloads. The full report is scanned in the media. Reviewing the CGM trends, BG are elevated sometime after lunch and dinner.   Physical Exam  BP 120/80   Pulse 76   Ht 5\' 3"  (1.6 m)   Wt 169 lb (76.7 kg)   SpO2 96%   BMI 29.94 kg/m    Constitutional: well developed, well nourished Head: normocephalic,  atraumatic Eyes: sclera anicteric, no redness Neck: supple Lungs: normal respiratory effort Neurology: alert and oriented Skin: dry, no appreciable rashes Musculoskeletal: no appreciable defects Psychiatric: normal mood and affect Diabetic Foot Exam - Simple   No data filed      Current Medications Patient's Medications  New Prescriptions   SEMAGLUTIDE, 1 MG/DOSE, 4 MG/3ML SOPN    Inject 1 mg as directed once a week.  Previous Medications   ALBUTEROL (PROAIR HFA) 108 (90 BASE) MCG/ACT INHALER    Inhale 2 puffs into the lungs every 4 (four) hours as needed for wheezing or shortness of breath.   ASPIRIN EC 81 MG TABLET    Take 1 tablet (81 mg total) by mouth daily. Swallow whole.   BLOOD GLUCOSE MONITORING SUPPL (ACCU-CHEK AVIVA PLUS) W/DEVICE  KIT    USE AS DIRECTED DAILY. E11.9   BUPROPION (WELLBUTRIN XL) 150 MG 24 HR TABLET    Take 1 tablet (150 mg total) by mouth daily.   CONTINUOUS BLOOD GLUC RECEIVER (FREESTYLE LIBRE 2 READER) DEVI    Use to check blood sugar three times daily. E11.49   CONTINUOUS GLUCOSE SENSOR (FREESTYLE LIBRE 2 SENSOR) MISC    USE TO CHECK BLOOD SUGAR 3 TIMES A DAY - CHANGE SENSOR EVERY 14 DAYS   GLUCOSE BLOOD (FREESTYLE TEST STRIPS) TEST STRIP    Use to check blood sugar three times daily. E11.49   HYDROXYZINE (ATARAX) 50 MG TABLET    Take 1 tablet (50 mg total) by mouth 3 (three) times daily as needed.   INDAPAMIDE (LOZOL) 1.25 MG TABLET    Take 1 tablet (1.25 mg total) by mouth daily.   INSULIN GLARGINE (LANTUS SOLOSTAR) 100 UNIT/ML SOLOSTAR PEN    Inject 25 Units into the skin daily. Increase by 2 units to a maximum daily dose of 30 units every 4th day until blood sugars are at goal   INSULIN LISPRO (HUMALOG KWIKPEN) 100 UNIT/ML KWIKPEN    Inject 1-15 Units into the skin 3 (three) times daily.   INSULIN PEN NEEDLE 31G X 5 MM MISC    1 each by Does not apply route at bedtime.   INSULIN PEN NEEDLE 32G X 4 MM MISC    1 Needle by Does not apply route 3 (three)  times daily.   LANCET DEVICES (ACCU-CHEK SOFTCLIX) LANCETS    Use as instructed daily.   LANCETS (FREESTYLE) LANCETS    Use to check blood sugar three times daily. E11.49   LUPRON DEPOT, 54-MONTH, 11.25 MG INJECTION    Inject 11.25 mg into the muscle every 3 (three) months.   METFORMIN (GLUCOPHAGE) 500 MG TABLET    Take 2 tablets (1,000 mg total) by mouth 2 (two) times daily with a meal.   METHOCARBAMOL (ROBAXIN) 750 MG TABLET    Take 1 tablet (750 mg total) by mouth every 8 (eight) hours as needed for muscle spasms.   NICOTINE (NICODERM CQ - DOSED IN MG/24 HOURS) 21 MG/24HR PATCH    Place 1 patch (21 mg total) onto the skin daily.   NORETHINDRONE (AYGESTIN) 5 MG TABLET    TAKE 1 TABLET BY MOUTH THREE TIMES A DAY   OMEPRAZOLE (PRILOSEC) 40 MG CAPSULE    TAKE 1 CAPSULE BY MOUTH EVERY  MORNING   PREGABALIN (LYRICA) 100 MG CAPSULE    Take 1 capsule (100 mg total) by mouth 2 (two) times daily.   SACCHAROMYCES BOULARDII (FLORASTOR) 250 MG CAPSULE    Take 1 capsule (250 mg total) by mouth 2 (two) times daily.   SPIRONOLACTONE (ALDACTONE) 50 MG TABLET    Take 1 tablet (50 mg total) by mouth daily.   VALACYCLOVIR (VALTREX) 500 MG TABLET    Take 1 tablet (500 mg total) by mouth 2 (two) times daily. For herpes prophylaxis  Modified Medications   No medications on file  Discontinued Medications   OZEMPIC, 2 MG/DOSE, 8 MG/3ML SOPN    Inject 0.5 mg as directed once a week.    Allergies Allergies  Allergen Reactions   Amlodipine     Leg pain, swelling around eyes    Atorvastatin Other (See Comments)     Joint pain   Crestor [Rosuvastatin]     Joint pain    Irbesartan     Leg pain, swelling around eyes  Metronidazole Nausea And Vomiting   Penicillins     childhood   Xanax [Alprazolam]     "Caused upper respiratory symptoms" per pt   Glyburide Other (See Comments)    "blood sugar dropped uncontrollably"   Linagliptin Diarrhea and Other (See Comments)    Stomach pain, sinus infection    Moxifloxacin Diarrhea    Past Medical History Past Medical History:  Diagnosis Date   Abnormal Pap smear of cervix    yrs ago   Adenomyosis    Allergy    Anemia    Anxiety    Aortic atherosclerosis (HCC)    Arthritis    Asthma    Chronic UTI    Clostridium difficile infection    Depression    Diabetes mellitus without complication (HCC) 10/22/2023   A1C 8.8   Diverticulosis    Endometriosis    Fibroid    Fibromyalgia    GERD (gastroesophageal reflux disease)    Hiatal hernia    Hyperlipidemia    Hypertension    Internal hemorrhoids    Meningitis, viral    Migraines    Pancreatitis    Pure hypercholesterolemia 12/23/2020   Tubular adenoma of colon     Past Surgical History Past Surgical History:  Procedure Laterality Date   APPENDECTOMY  1990   CERVICAL BIOPSY  W/ LOOP ELECTRODE EXCISION     LEEP   CESAREAN SECTION  2002   CHOLECYSTECTOMY  2011   COLONOSCOPY     UPPER GASTROINTESTINAL ENDOSCOPY     URINARY SURGERY     urethra sling, removal, and then revision 2016 (4 surgeries)   WISDOM TOOTH EXTRACTION      Family History family history includes Cancer in her mother; Diabetes in her father; Heart attack in her maternal grandfather; Hypertension in her mother; Multiple sclerosis in her paternal grandmother.  Social History Social History   Socioeconomic History   Marital status: Single    Spouse name: Not on file   Number of children: 3   Years of education: Not on file   Highest education level: Not on file  Occupational History   Not on file  Tobacco Use   Smoking status: Former    Current packs/day: 0.10    Average packs/day: 0.1 packs/day for 20.0 years (2.0 ttl pk-yrs)    Types: Cigarettes   Smokeless tobacco: Never   Tobacco comments:    Patient is engaged in health coaching for smoking cessation as of 12/26/20. Patient stated that she has stopped smoking, drinking and any form of drug usage since her stroke.  Vaping Use   Vaping status:  Never Used  Substance and Sexual Activity   Alcohol use: Not Currently    Comment: occ   Drug use: Not Currently    Types: Marijuana    Comment: occ   Sexual activity: Yes    Partners: Male    Birth control/protection: Surgical    Comment: BTL  Other Topics Concern   Not on file  Social History Narrative   Lives home with adult niece.  Not working.  Disability pending.  Pt is single.  Education GED.  3 children.    Social Drivers of Corporate investment banker Strain: Low Risk  (01/25/2024)   Overall Financial Resource Strain (CARDIA)    Difficulty of Paying Living Expenses: Not very hard  Food Insecurity: Food Insecurity Present (01/25/2024)   Hunger Vital Sign    Worried About Running Out of Food in the Last Year:  Sometimes true    Ran Out of Food in the Last Year: Sometimes true  Transportation Needs: No Transportation Needs (01/25/2024)   PRAPARE - Administrator, Civil Service (Medical): No    Lack of Transportation (Non-Medical): No  Physical Activity: Insufficiently Active (01/25/2024)   Exercise Vital Sign    Days of Exercise per Week: 3 days    Minutes of Exercise per Session: 30 min  Stress: Stress Concern Present (01/25/2024)   [employer] of Occupational Health - Occupational Stress Questionnaire    Feeling of Stress : Rather much  Social Connections: Socially Isolated (01/25/2024)   Social Connection and Isolation Panel [NHANES]    Frequency of Communication with Friends and Family: Three times a week    Frequency of Social Gatherings with Friends and Family: Once a week    Attends Religious Services: Never    Active Member of Clubs or Organizations: No    Attends Banker Meetings: Never    Marital Status: Never married  Intimate Partner Violence: Not At Risk (01/25/2024)   Humiliation, Afraid, Rape, and Kick questionnaire    Fear of Current or Ex-Partner: No    Emotionally Abused: No    Physically Abused: No    Sexually Abused: No     Lab Results  Component Value Date   HGBA1C 6.4 (H) 01/08/2024   HGBA1C 7.7 (H) 11/22/2023   HGBA1C 8.8 (H) 10/22/2023   Lab Results  Component Value Date   CHOL 231 (H) 01/08/2024   Lab Results  Component Value Date   HDL 23 (L) 01/08/2024   Lab Results  Component Value Date   LDLCALC 175 (H) 01/08/2024   Lab Results  Component Value Date   TRIG 165 (H) 01/08/2024   Lab Results  Component Value Date   CHOLHDL 10.0 01/08/2024   Lab Results  Component Value Date   CREATININE 0.97 02/08/2024   Lab Results  Component Value Date   GFR 106.09 02/20/2022   Lab Results  Component Value Date   MICROALBUR 1.3 11/22/2023      Component Value Date/Time   NA 136 02/08/2024 1109   NA 138 01/20/2024 1630   K 4.6 02/08/2024 1109   CL 102 02/08/2024 1109   CO2 29 02/08/2024 1109   GLUCOSE 120 (H) 02/08/2024 1109   BUN 18 02/08/2024 1109   BUN 19 01/20/2024 1630   CREATININE 0.97 02/08/2024 1109   CREATININE 0.70 11/22/2023 0950   CALCIUM 9.7 02/08/2024 1109   PROT 7.7 02/08/2024 1109   PROT 6.6 01/20/2024 1630   ALBUMIN 4.3 02/08/2024 1109   ALBUMIN 4.1 01/20/2024 1630   AST 12 (L) 02/08/2024 1109   ALT 13 02/08/2024 1109   ALKPHOS 52 02/08/2024 1109   BILITOT 0.4 02/08/2024 1109   GFRNONAA >60 02/08/2024 1109   GFRNONAA >89 10/28/2016 0851   GFRAA 127 12/12/2020 1149   GFRAA >89 10/28/2016 0851      Latest Ref Rng & Units 02/08/2024   11:09 AM 01/20/2024    4:30 PM 01/09/2024    5:30 AM  BMP  Glucose 70 - 99 mg/dL 161  096  045   BUN 6 - 20 mg/dL 18  19  11    Creatinine 0.44 - 1.00 mg/dL 4.09  8.11  9.14   BUN/Creat Ratio 9 - 23  20    Sodium 135 - 145 mmol/L 136  138  139   Potassium 3.5 - 5.1 mmol/L 4.6  4.6  3.7  Chloride 98 - 111 mmol/L 102  103  109   CO2 22 - 32 mmol/L 29  20  23    Calcium 8.9 - 10.3 mg/dL 9.7  9.2  8.2        Component Value Date/Time   WBC 10.6 (H) 02/08/2024 1109   WBC 11.4 (H) 01/09/2024 0530   RBC 4.88 02/08/2024 1109    HGB 15.2 (H) 02/08/2024 1109   HGB 15.0 01/20/2024 1630   HCT 43.6 02/08/2024 1109   HCT 44.9 01/20/2024 1630   PLT 316 02/08/2024 1109   PLT 330 01/20/2024 1630   MCV 89.3 02/08/2024 1109   MCV 95 01/20/2024 1630   MCH 31.1 02/08/2024 1109   MCHC 34.9 02/08/2024 1109   RDW 13.7 02/08/2024 1109   RDW 12.7 01/20/2024 1630   LYMPHSABS 2.5 02/08/2024 1109   LYMPHSABS 2.5 01/20/2024 1630   MONOABS 0.6 02/08/2024 1109   EOSABS 0.1 02/08/2024 1109   EOSABS 0.1 01/20/2024 1630   BASOSABS 0.1 02/08/2024 1109   BASOSABS 0.1 01/20/2024 1630     Parts of this note may have been dictated using voice recognition software. There may be variances in spelling and vocabulary which are unintentional. Not all errors are proofread. Please notify the [facility] if any discrepancies are noted or if the meaning of any statement is not clear.

## 2024-02-08 NOTE — Progress Notes (Signed)
 [facility] Cancer Follow up:    Hoy Register, MD [address] [place] [address]   DIAGNOSIS: IRON DEFICIENCY ANEMIA   SUMMARY OF HEMATOLOGIC HISTORY: Initial patient consult with Dr. Al Pimple on Mar 06, 2023 for leukocytosis, testing indicated iron deficiency with a ferritin of 6 and iron saturation of 7%. Venofer given at 300 mg weekly x 3 beginning March 31, 2023.  CURRENT THERAPY: intermittent IV iron  INTERVAL HISTORY:  Discussed the use of AI scribe software for clinical note transcription with the patient, who gave verbal consent to proceed.  Amber Rose 48 y.o. female , a patient with a history of stroke, iron deficiency, B12 deficiency, and gynecological issues, presents with numbness on the right side of the body following a stroke. The numbness is described as similar to the sensation of a limb falling asleep, but it is not associated with paralysis. The patient has started physical therapy to address this issue.  The patient also reports fatigue, which has been a concern since January. It is unclear whether this fatigue is a pre-symptom of the stroke or related to the iron and B12 deficiencies. The patient is interested in ensuring that her vitamin levels are adequate, particularly as she is trying to improve her diet.  In addition to these concerns, the patient has ongoing gynecological issues. She reports spotting that has persisted for a year, despite birth control treatment to address heavy bleeding. The patient has noticed that the spotting stopped during a period of taking a blood thinner (Plavix) following the stroke, but resumed once the medication was stopped. The patient is interested in increasing her aspirin dosage (currently 81 mg), which she is currently taking for stroke prevention, to see if it might help with the gynecological issues.  The patient has recently quit smoking and is working to maintain this change. She is not  currently driving to avoid the temptation of buying cigarettes.   Patient Active Problem List   Diagnosis Date Noted   CVA (cerebral vascular accident) (HCC) 01/08/2024   Leukocytosis 01/08/2024   Type 2 diabetes mellitus with chronic kidney disease, with long-term current use of insulin (HCC) 01/08/2024   Hyperlipidemia 01/08/2024   GERD (gastroesophageal reflux disease) 01/08/2024   Overweight (BMI 25.0-29.9) 01/08/2024   Moderately severe major depression (HCC) 01/06/2024   Yeast vaginitis 08/30/2023   Iron deficiency anemia 03/11/2023   PMDD (premenstrual dysphoric disorder) 11/17/2022   Abnormal uterine bleeding (AUB) 11/17/2022   Statin myopathy 09/23/2022   Steatorrhea 08/17/2022   Irregular menses 11/28/2021   Perimenopause 11/28/2021   Episode of recurrent major depressive disorder (HCC) 04/09/2021   BMI 33.0-33.9,adult 04/09/2021   Pure hypercholesterolemia 12/23/2020   Menorrhagia with regular cycle 09/13/2020   Well woman exam 09/13/2020   History of herpes zoster 01/17/2018   Overactive bladder 12/28/2017   Recurrent infections 11/19/2017   Cyst of nasal cavity 11/19/2017   Angio-edema 11/19/2017   Abdominal pain, epigastric 11/04/2017   Dysphagia 11/04/2017   Gastroesophageal reflux disease 07/12/2017   Degenerative disc disease at L5-S1 level 05/14/2017   Fibromyalgia 05/14/2017   ADD (attention deficit disorder) 05/14/2017   Urinary incontinence 04/05/2017   Thigh pain 02/02/2017   Anxiety and depression 02/02/2017   Tobacco abuse 02/02/2017   Facial flushing 02/02/2017   Hypertension 10/23/2016   Hidradenitis 10/23/2016   Diabetes (HCC) 09/12/2014   Lupus anticoagulant positive 07/03/2014   Vitamin D deficiency 06/06/2014    is allergic to amlodipine, atorvastatin, crestor [  rosuvastatin], irbesartan, metronidazole, penicillins, xanax [alprazolam], glyburide, linagliptin, and moxifloxacin.  MEDICAL HISTORY: Past Medical History:  Diagnosis Date    Abnormal Pap smear of cervix    yrs ago   Adenomyosis    Allergy    Anemia    Anxiety    Aortic atherosclerosis (HCC)    Arthritis    Asthma    Chronic UTI    Clostridium difficile infection    Depression    Diabetes mellitus without complication (HCC) 10/22/2023   A1C 8.8   Diverticulosis    Endometriosis    Fibroid    Fibromyalgia    GERD (gastroesophageal reflux disease)    Hiatal hernia    Hyperlipidemia    Hypertension    Internal hemorrhoids    Meningitis, viral    Migraines    Pancreatitis    Pure hypercholesterolemia 12/23/2020   Tubular adenoma of colon     SURGICAL HISTORY: Past Surgical History:  Procedure Laterality Date   APPENDECTOMY  1990   CERVICAL BIOPSY  W/ LOOP ELECTRODE EXCISION     LEEP   CESAREAN SECTION  2002   CHOLECYSTECTOMY  2011   COLONOSCOPY     UPPER GASTROINTESTINAL ENDOSCOPY     URINARY SURGERY     urethra sling, removal, and then revision 2016 (4 surgeries)   WISDOM TOOTH EXTRACTION      SOCIAL HISTORY: Social History   Socioeconomic History   Marital status: Single    Spouse name: Not on file   Number of children: 3   Years of education: Not on file   Highest education level: Not on file  Occupational History   Not on file  Tobacco Use   Smoking status: Former    Current packs/day: 0.10    Average packs/day: 0.1 packs/day for 20.0 years (2.0 ttl pk-yrs)    Types: Cigarettes   Smokeless tobacco: Never   Tobacco comments:    Patient is engaged in health coaching for smoking cessation as of 12/26/20. Patient stated that she has stopped smoking, drinking and any form of drug usage since her stroke.  Vaping Use   Vaping status: Never Used  Substance and Sexual Activity   Alcohol use: Not Currently    Comment: occ   Drug use: Not Currently    Types: Marijuana    Comment: occ   Sexual activity: Yes    Partners: Male    Birth control/protection: Surgical    Comment: BTL  Other Topics Concern   Not on file  Social  History Narrative   Lives home with adult niece.  Not working.  Disability pending.  Pt is single.  Education GED.  3 children.    Social Drivers of Corporate investment banker Strain: Low Risk  (01/25/2024)   Overall Financial Resource Strain (CARDIA)    Difficulty of Paying Living Expenses: Not very hard  Food Insecurity: Food Insecurity Present (01/25/2024)   Hunger Vital Sign    Worried About Running Out of Food in the Last Year: Sometimes true    Ran Out of Food in the Last Year: Sometimes true  Transportation Needs: No Transportation Needs (01/25/2024)   PRAPARE - Administrator, Civil Service (Medical): No    Lack of Transportation (Non-Medical): No  Physical Activity: Insufficiently Active (01/25/2024)   Exercise Vital Sign    Days of Exercise per Week: 3 days    Minutes of Exercise per Session: 30 min  Stress: Stress Concern Present (01/25/2024)  [employer] of Occupational Health - Occupational Stress Questionnaire    Feeling of Stress : Rather much  Social Connections: Socially Isolated (01/25/2024)   Social Connection and Isolation Panel [NHANES]    Frequency of Communication with Friends and Family: Three times a week    Frequency of Social Gatherings with Friends and Family: Once a week    Attends Religious Services: Never    Database administrator or Organizations: No    Attends Banker Meetings: Never    Marital Status: Never married  Intimate Partner Violence: Not At Risk (01/25/2024)   Humiliation, Afraid, Rape, and Kick questionnaire    Fear of Current or Ex-Partner: No    Emotionally Abused: No    Physically Abused: No    Sexually Abused: No    FAMILY HISTORY: Family History  Problem Relation Age of Onset   Cancer Mother        cervical, breast, lung, skin, bladder-ex smoker   Hypertension Mother    Diabetes Father    Heart attack Maternal Grandfather    Multiple sclerosis Paternal Grandmother     Review of Systems   Constitutional:  Positive for fatigue. Negative for appetite change, chills, fever and unexpected weight change.  HENT:   Negative for hearing loss, lump/mass and trouble swallowing.   Eyes:  Negative for eye problems and icterus.  Respiratory:  Negative for chest tightness, cough and shortness of breath.   Cardiovascular:  Negative for chest pain, leg swelling and palpitations.  Gastrointestinal:  Negative for abdominal distention, abdominal pain, constipation, diarrhea, nausea and vomiting.  Endocrine: Negative for hot flashes.  Genitourinary:  Negative for difficulty urinating.   Musculoskeletal:  Negative for arthralgias.  Skin:  Negative for itching and rash.  Neurological:  Positive for numbness. Negative for dizziness, extremity weakness and headaches.  Hematological:  Negative for adenopathy. Does not bruise/bleed easily.  Psychiatric/Behavioral:  Negative for depression. The patient is not nervous/anxious.       PHYSICAL EXAMINATION    Vitals:   02/08/24 1034  BP: (!) 150/86  Pulse: 77  Resp: 18  SpO2: 98%    Physical Exam Constitutional:      General: She is not in acute distress.    Appearance: Normal appearance. She is not toxic-appearing.  HENT:     Head: Normocephalic and atraumatic.     Mouth/Throat:     Mouth: Mucous membranes are moist.     Pharynx: Oropharynx is clear. No oropharyngeal exudate or posterior oropharyngeal erythema.  Eyes:     General: No scleral icterus. Cardiovascular:     Rate and Rhythm: Normal rate and regular rhythm.     Pulses: Normal pulses.     Heart sounds: Normal heart sounds.  Pulmonary:     Effort: Pulmonary effort is normal.     Breath sounds: Normal breath sounds.  Abdominal:     General: Abdomen is flat. Bowel sounds are normal. There is no distension.     Palpations: Abdomen is soft.     Tenderness: There is no abdominal tenderness.  Musculoskeletal:        General: No swelling.     Cervical back: Neck supple.   Lymphadenopathy:     Cervical: No cervical adenopathy.  Skin:    General: Skin is warm and dry.     Findings: No rash.  Neurological:     General: No focal deficit present.     Mental Status: She is alert.  Psychiatric:  Mood and Affect: Mood normal.        Behavior: Behavior normal.     LABORATORY DATA:  CBC    Component Value Date/Time   WBC 10.6 (H) 02/08/2024 1109   WBC 11.4 (H) 01/09/2024 0530   RBC 4.88 02/08/2024 1109   HGB 15.2 (H) 02/08/2024 1109   HGB 15.0 01/20/2024 1630   HCT 43.6 02/08/2024 1109   HCT 44.9 01/20/2024 1630   PLT 316 02/08/2024 1109   PLT 330 01/20/2024 1630   MCV 89.3 02/08/2024 1109   MCV 95 01/20/2024 1630   MCH 31.1 02/08/2024 1109   MCHC 34.9 02/08/2024 1109   RDW 13.7 02/08/2024 1109   RDW 12.7 01/20/2024 1630   LYMPHSABS 2.5 02/08/2024 1109   LYMPHSABS 2.5 01/20/2024 1630   MONOABS 0.6 02/08/2024 1109   EOSABS 0.1 02/08/2024 1109   EOSABS 0.1 01/20/2024 1630   BASOSABS 0.1 02/08/2024 1109   BASOSABS 0.1 01/20/2024 1630    CMP     Component Value Date/Time   NA 136 02/08/2024 1109   NA 138 01/20/2024 1630   K 4.6 02/08/2024 1109   CL 102 02/08/2024 1109   CO2 29 02/08/2024 1109   GLUCOSE 120 (H) 02/08/2024 1109   BUN 18 02/08/2024 1109   BUN 19 01/20/2024 1630   CREATININE 0.97 02/08/2024 1109   CREATININE 0.70 11/22/2023 0950   CALCIUM 9.7 02/08/2024 1109   PROT 7.7 02/08/2024 1109   PROT 6.6 01/20/2024 1630   ALBUMIN 4.3 02/08/2024 1109   ALBUMIN 4.1 01/20/2024 1630   AST 12 (L) 02/08/2024 1109   ALT 13 02/08/2024 1109   ALKPHOS 52 02/08/2024 1109   BILITOT 0.4 02/08/2024 1109   GFRNONAA >60 02/08/2024 1109   GFRNONAA >89 10/28/2016 0851   GFRAA 127 12/12/2020 1149   GFRAA >89 10/28/2016 0851     ASSESSMENT and THERAPY PLAN:   Iron deficiency anemia Aizlynn is a 48 year old woman with iron deficiency anemia here for follow-up.    Iron deficiency anemia Improved after iron infusion. Experiences  gastrointestinal upset with oral iron. Labs will determine current status and need for further treatment. - Order complete blood count, iron studies, and B12 level. - Consider repeat iron infusion if iron levels are low. - Continue f/u with gynecology for abnormal uterine bleeding as needed.  Vitamin B12 deficiency Previous supplementation raised levels significantly. Current status to be evaluated with labs. - Order B12 level.  Stroke Recent stroke with residual right-sided numbness. Smoking cessation crucial to reduce recurrent stroke risk. - Continue physical therapy. - Encourage smoking cessation. - Can consider increasing aspirin to two 81mg  tablets daily if cleared with other healthcare team members.   Follow-up Follow-up plan based on lab results and current treatment response. - Schedule follow-up appointment in six months, or sooner if lab results indicate.   All questions were answered. The patient knows to call the clinic with any problems, questions or concerns. We can certainly see the patient much sooner if necessary.  Total encounter time:20 minutes*in face-to-face visit time, chart review, lab review, care coordination, order entry, and documentation of the encounter time.    Lillard Anes, NP 02/08/24 1:17 PM Medical Oncology and Hematology [facility] [address] [place], [place] [address] Tel. [phone]    Fax. [phone]  *Total Encounter Time as defined by the Centers for Medicare and Medicaid Services includes, in addition to the face-to-face time of a patient visit (documented in the note above) non-face-to-face time: obtaining  and reviewing outside history, ordering and reviewing medications, tests or procedures, care coordination (communications with other health care professionals or caregivers) and documentation in the medical record.

## 2024-02-08 NOTE — Patient Instructions (Addendum)
 Will recommend the following: Lantus 24 units qpm (decrease by 2 units if BG is less than 70) Metformin 500mg  2 tabs bid Ozempic 1 mg per week  Humalog scale: Use it 15 min before you eat: Space it out by 4 hours.  201 - 225: 3 units 226 - 250: 4 units 251 - 275: 5 units 276 - 300: 6 units 301 - 325: 7 units 326 - 350: 8 units 351 - 375: 9 units 376 - 400: 10 units   ________    Goals of DM therapy:  Morning Fasting blood sugar: 80-140  Blood sugar before meals: 80-140 Bed time blood sugar: 100-150  A1C <7%, limited only by hypoglycemia  1.Diabetes medications and their side effects discussed, including hypoglycemia    2. Check blood glucose:  a) Always check blood sugars before driving. Please see below (under hypoglycemia) on how to manage b) Check a minimum of 3 times/day or more as needed when having symptoms of hypoglycemia.   c) Try to check blood glucose before sleeping/in the middle of the night to ensure that it is remaining stable and not dropping less than 100 d) Check blood glucose more often if sick  3. Diet: a) 3 meals per day schedule b: Restrict carbs to 60-70 grams (4 servings) per meal c) Colorful vegetables - 3 servings a day, and low sugar fruit 2 servings/day Plate control method: 1/4 plate protein, 1/4 starch, 1/2 green, yellow, or red vegetables d) Avoid carbohydrate snacks unless hypoglycemic episode, or increased physical activity  4. Regular exercise as tolerated, preferably 3 or more hours a week  5. Hypoglycemia: a)  Do not drive or operate machinery without first testing blood glucose to assure it is over 90 mg%, or if dizzy, lightheaded, not feeling normal, etc, or  if foot or leg is numb or weak. b)  If blood glucose less than 70, take four 5gm Glucose tabs or 15-30 gm Glucose gel.  Repeat every 15 min as needed until blood sugar is >100 mg/dl. If hypoglycemia persists then call 911.   6. Sick day management: a) Check blood glucose more  often b) Continue usual therapy if blood sugars are elevated.   7. Contact the doctor immediately if blood glucose is frequently <60 mg/dl, or an episode of severe hypoglycemia occurs (where someone had to give you glucose/  glucagon or if you passed out from a low blood glucose), or if blood glucose is persistently >350 mg/dl, for further management  8. A change in level of physical activity or exercise and a change in diet may also affect your blood sugar. Check blood sugars more often and call if needed.  Instructions: 1. Bring glucose meter, blood glucose records on every visit for review 2. Continue to follow up with primary care physician and other providers for medical care 3. Yearly eye  and foot exam 4. Please get blood work done prior to the next appointment

## 2024-02-08 NOTE — Assessment & Plan Note (Signed)
 Amber Rose is a 48 year old woman with iron deficiency anemia here for follow-up.    Iron deficiency anemia Improved after iron infusion. Experiences gastrointestinal upset with oral iron. Labs will determine current status and need for further treatment. - Order complete blood count, iron studies, and B12 level. - Consider repeat iron infusion if iron levels are low. - Continue f/u with gynecology for abnormal uterine bleeding as needed.  Vitamin B12 deficiency Previous supplementation raised levels significantly. Current status to be evaluated with labs. - Order B12 level.  Stroke Recent stroke with residual right-sided numbness. Smoking cessation crucial to reduce recurrent stroke risk. - Continue physical therapy. - Encourage smoking cessation. - Can consider increasing aspirin to two 81mg  tablets daily if cleared with other healthcare team members.   Follow-up Follow-up plan based on lab results and current treatment response. - Schedule follow-up appointment in six months, or sooner if lab results indicate.

## 2024-02-09 ENCOUNTER — Ambulatory Visit: Admitting: Physical Therapy

## 2024-02-09 ENCOUNTER — Telehealth: Payer: Self-pay | Admitting: *Deleted

## 2024-02-09 ENCOUNTER — Ambulatory Visit: Admitting: Family Medicine

## 2024-02-09 ENCOUNTER — Other Ambulatory Visit: Payer: Self-pay | Admitting: Pharmacist Clinician (PhC)/ Clinical Pharmacy Specialist

## 2024-02-09 ENCOUNTER — Encounter: Payer: Self-pay | Admitting: Physical Therapy

## 2024-02-09 ENCOUNTER — Telehealth: Payer: Self-pay | Admitting: Hematology and Oncology

## 2024-02-09 VITALS — BP 170/92 | HR 75

## 2024-02-09 DIAGNOSIS — R2689 Other abnormalities of gait and mobility: Secondary | ICD-10-CM

## 2024-02-09 DIAGNOSIS — I69351 Hemiplegia and hemiparesis following cerebral infarction affecting right dominant side: Secondary | ICD-10-CM

## 2024-02-09 DIAGNOSIS — G8191 Hemiplegia, unspecified affecting right dominant side: Secondary | ICD-10-CM | POA: Diagnosis not present

## 2024-02-09 DIAGNOSIS — R2681 Unsteadiness on feet: Secondary | ICD-10-CM

## 2024-02-09 DIAGNOSIS — I639 Cerebral infarction, unspecified: Secondary | ICD-10-CM | POA: Diagnosis not present

## 2024-02-09 DIAGNOSIS — M6281 Muscle weakness (generalized): Secondary | ICD-10-CM | POA: Diagnosis not present

## 2024-02-09 LAB — BASIC METABOLIC PANEL WITH GFR
BUN/Creatinine Ratio: 19 (ref 9–23)
BUN: 18 mg/dL (ref 6–24)
CO2: 23 mmol/L (ref 20–29)
Calcium: 9.9 mg/dL (ref 8.7–10.2)
Chloride: 100 mmol/L (ref 96–106)
Creatinine, Ser: 0.94 mg/dL (ref 0.57–1.00)
Glucose: 97 mg/dL (ref 70–99)
Potassium: 4.6 mmol/L (ref 3.5–5.2)
Sodium: 137 mmol/L (ref 134–144)
eGFR: 75 mL/min/{1.73_m2} (ref >59)

## 2024-02-09 NOTE — Telephone Encounter (Signed)
 Patient scheduled appointments. Patient is aware of all appointment details.

## 2024-02-09 NOTE — Telephone Encounter (Signed)
Per Lindsey Causey, NP, called pt with message below. Pt verbalized understanding.  

## 2024-02-09 NOTE — Telephone Encounter (Signed)
-----   Message from Conway Dennis sent at 02/09/2024  8:55 AM EDT ----- Iron studies are good.  Tell her I recommend vitamin b12 SUBLINGUAL 1000mcg daily ----- Message ----- From: Interface, Lab In [place] Sent: 02/08/2024  11:25 AM EDT To: Percival Brace, NP

## 2024-02-09 NOTE — Therapy (Signed)
 OUTPATIENT PHYSICAL THERAPY NEURO TREATMENT   Patient Name: Amber Rose MRN: [id number] DOB:1975/12/10, 48 y.o., female Today's Date: 02/09/2024   PCP: Joaquin Mulberry, MD REFERRING PROVIDER: Hassie Lint, PA-C  END OF SESSION:  PT End of Session - 02/09/24 1110     Visit Number 3    Number of Visits 9   8 + eval   Date for PT Re-Evaluation 03/31/24   pushed out due to future multi-disciplinary needs   Authorization Type [employer] dual complete    PT Start Time 1104    PT Stop Time 1140    PT Time Calculation (min) 36 min    Equipment Utilized During Treatment Gait belt    Activity Tolerance Treatment limited secondary to medical complications (Comment);Patient limited by pain   HTN; worsened N/T on hemiparetic side   Behavior During Therapy [facility] for tasks assessed/performed             Past Medical History:  Diagnosis Date   Abnormal Pap smear of cervix    yrs ago   Adenomyosis    Allergy     Anemia    Anxiety    Aortic atherosclerosis (HCC)    Arthritis    Asthma    Chronic UTI    Clostridium difficile infection    Depression    Diabetes mellitus without complication (HCC) 10/22/2023   A1C 8.8   Diverticulosis    Endometriosis    Fibroid    Fibromyalgia    GERD (gastroesophageal reflux disease)    Hiatal hernia    Hyperlipidemia    Hypertension    Internal hemorrhoids    Meningitis, viral    Migraines    Pancreatitis    Pure hypercholesterolemia 12/23/2020   Tubular adenoma of colon    Past Surgical History:  Procedure Laterality Date   APPENDECTOMY  1990   CERVICAL BIOPSY  W/ LOOP ELECTRODE EXCISION     LEEP   CESAREAN SECTION  2002   CHOLECYSTECTOMY  2011   COLONOSCOPY     UPPER GASTROINTESTINAL ENDOSCOPY     URINARY SURGERY     urethra sling, removal, and then revision 2016 (4 surgeries)   WISDOM TOOTH EXTRACTION     Patient Active Problem List   Diagnosis Date Noted   CVA (cerebral vascular accident) (HCC) 01/08/2024    Leukocytosis 01/08/2024   Type 2 diabetes mellitus with chronic kidney disease, with long-term current use of insulin  (HCC) 01/08/2024   Hyperlipidemia 01/08/2024   GERD (gastroesophageal reflux disease) 01/08/2024   Overweight (BMI 25.0-29.9) 01/08/2024   Moderately severe major depression (HCC) 01/06/2024   Yeast vaginitis 08/30/2023   Iron  deficiency anemia 03/11/2023   PMDD (premenstrual dysphoric disorder) 11/17/2022   Abnormal uterine bleeding (AUB) 11/17/2022   Statin myopathy 09/23/2022   Steatorrhea 08/17/2022   Irregular menses 11/28/2021   Perimenopause 11/28/2021   Episode of recurrent major depressive disorder (HCC) 04/09/2021   BMI 33.0-33.9,adult 04/09/2021   Pure hypercholesterolemia 12/23/2020   Menorrhagia with regular cycle 09/13/2020   Well woman exam 09/13/2020   History of herpes zoster 01/17/2018   Overactive bladder 12/28/2017   Recurrent infections 11/19/2017   Cyst of nasal cavity 11/19/2017   Angio-edema 11/19/2017   Abdominal pain, epigastric 11/04/2017   Dysphagia 11/04/2017   Gastroesophageal reflux disease 07/12/2017   Degenerative disc disease at L5-S1 level 05/14/2017   Fibromyalgia 05/14/2017   ADD (attention deficit disorder) 05/14/2017   Urinary incontinence 04/05/2017   Thigh pain 02/02/2017   Anxiety and depression  02/02/2017   Tobacco abuse 02/02/2017   Facial flushing 02/02/2017   Hypertension 10/23/2016   Hidradenitis 10/23/2016   Diabetes (HCC) 09/12/2014   Lupus anticoagulant positive 07/03/2014   Vitamin D  deficiency 06/06/2014    ONSET DATE: 01/07/2024 (Lacunar CVA)  REFERRING DIAG: G81.91 (ICD-10-CM) - Right hemiparesis (HCC) I63.9 (ICD-10-CM) - Cerebrovascular accident (CVA), unspecified mechanism (HCC)  THERAPY DIAG:  Other abnormalities of gait and mobility  Muscle weakness (generalized)  Unsteadiness on feet  Hemiplegia and hemiparesis following cerebral infarction affecting right dominant side (HCC)  Rationale  for Evaluation and Treatment: Rehabilitation  SUBJECTIVE:                                                                                                                                                                                             SUBJECTIVE STATEMENT: Pt reports she remains with numbness down her right side that has become more prominent sometime yesterday (she is unsure exactly when).  She denies recent falls or new weakness.  She reports her right calf is tender to touch. Pt accompanied by: self (family dropped her off)  PERTINENT HISTORY: HTN, anxiety, depression, fibromyalgia, herpes zoster, DM2, statin intolerance, thalamic CVA, CKD  PAIN:  Are you having pain? Yes: NPRS scale: 10 Pain location: right leg is more noticeable, right arm just feels "dead" but this is unchanged Pain description: cramping/stiffness/numbness in right hemibody Aggravating factors: using the arm, laying on the right Relieving factors: sitting still  PRECAUTIONS: Fall  RED FLAGS: Bowel or bladder incontinence: Yes: urinary incontinence - wearing depends today and MD aware per report    WEIGHT BEARING RESTRICTIONS: No  FALLS: Has patient fallen in last 6 months? No  LIVING ENVIRONMENT: Lives with: lives with an adult companion Lives in: House/apartment Stairs: Yes: External: 3 steps; none Has following equipment at home: Grab bars  PLOF: Independent with gait, Independent with transfers, Needs assistance with ADLs, and Needs assistance with homemaking  PATIENT GOALS: "Everything is really up top so I don't really know about PT."  OBJECTIVE:  Note: Objective measures were completed at Evaluation unless otherwise noted.  DIAGNOSTIC FINDINGS:  MR Brain 01/08/2024: IMPRESSION: Acute lacunar infarct in the left thalamus.  COGNITION: Overall cognitive status:  pt reports some difficulty with memory/losing her place with tasks due to focus   SENSATION: Light touch: WFL - pt reports  decreased R > L  COORDINATION: LE RAMS;  impaired Heel-to-shin:  WNL bilaterally  EDEMA:  WNL  MUSCLE TONE: Difficulty discerning flexion spasticity due to pt difficulty relaxing  POSTURE: rounded shoulders and forward head  LOWER EXTREMITY ROM:  Active  Right Eval Left Eval  Hip flexion WNL WNL  Hip extension    Hip abduction    Hip adduction    Hip internal rotation    Hip external rotation    Knee flexion " "  Knee extension " "  Ankle dorsiflexion " "  Ankle plantarflexion    Ankle inversion    Ankle eversion     (Blank rows = not tested)  LOWER EXTREMITY MMT:    MMT Right Eval Left Eval  Hip flexion 4-/5 4+/5  Hip extension    Hip abduction    Hip adduction    Hip internal rotation    Hip external rotation    Knee flexion 4-/5 4+/5  Knee extension 4/5 5/5  Ankle dorsiflexion 4+/5 5/5  Ankle plantarflexion    Ankle inversion    Ankle eversion    (Blank rows = not tested)  BED MOBILITY:  Pt sleeps in standard bed and requires increased time and momentum to get roll and has difficulty managing bedding  TRANSFERS: Assistive device utilized: None  Sit to stand: SBA Stand to sit: SBA Chair to chair: SBA  GAIT: Gait pattern:  bilateral ER, step through pattern, decreased stride length, decreased hip/knee flexion- Right, decreased hip/knee flexion- Left, decreased ankle dorsiflexion- Right, decreased ankle dorsiflexion- Left, trendelenburg, and wide BOS Distance walked: various clinic distances Assistive device utilized: None Level of assistance: SBA Comments: Pt has severe drift from pathway to left then right when entering clinic, increased time to correct path with cuing.  Truncal ataxia?  FUNCTIONAL TESTS:  5 times sit to stand: 15.13 sec w/ light BUE support, severe hyperextension in standing, mild weight shift left during sitting Timed up and go (TUG): 11.28 sec SBA, no instability in turning with small radius 10 meter walk test: 8.50 sec =  1.18 m/sec OR 3.88 ft/sec Functional gait assessment: To be assessed.  PATIENT SURVEYS:  None completed due to time.                                                                                                                              TREATMENT DATE: 02/09/2024  LUE IN SITTING PRIOR TO INTERVENTIONS: Vitals:   02/09/24 1107  BP: (!) 170/92  Pulse: 75   RLE assessment:  Skin is dry and closed, no redness or other discoloration, tenderness is in the medial gastroc at about 23 cm up from medial malleolus and 6 cm around to backside of calf, + Homan's, calf pain started yesterday gradually and has caused her to limp without new activity to explain pain.  10cm up from medial malleolus:  24.3 cm (LLE = 23 cm at same line of measurement) 20cm up from medial malleolus:  33.4 cm (33.5 cm at same line of measurement)  PATIENT EDUCATION: Education details: BP monitoring at home/logging if able.  Discussed DVT criteria and notable concerns today - called and talked to PCP office per pt agreement  and they are agreeable to see her for issue today at 1:40pm (arrive by 1:25pm).  Deferred questions about lab values to PCP today as pt is concerned for recurrent stroke - did discuss contributing factors to elevated HGB that might be relevant to patient. Person educated: Patient Education method: Explanation Education comprehension: verbalized understanding and needs further education  HOME EXERCISE PROGRAM: Access Code: [email or web address] URL: [email or web address] Date: 02/04/2024 Prepared by: Marilou Showman  Exercises - Sit to Stand with Armchair  - 1 x daily - 7 x weekly - 3 sets - 10 reps - Corner Balance Feet Together With Eyes Open  - 1 x daily - 5 x weekly - 1 sets - 3-4 reps - 30 seconds hold - Corner Balance Feet Together With Eyes Closed  - 1 x daily - 5 x weekly - 1 sets - 3-4 reps - 30 seconds hold - Corner Balance Feet Together: Eyes Open With Head Turns  - 1 x daily - 5 x  weekly - 1 sets - 3-4 reps - 30 seconds hold - Romberg Stance with Head Nods  - 1 x daily - 5 x weekly - 2-3 sets - 10 reps - Tandem Walking with Counter Support  - 1 x daily - 5 x weekly - 3 sets - 10 reps  GOALS: Goals reviewed with patient? Yes  SHORT TERM GOALS: Target date: 02/25/2024  Pt will be independent and compliant with initial strength and balance focused HEP in order to maintain functional progress and improve mobility. Baseline:  To be established. Goal status: INITIAL  2.  Pt will decrease 5xSTS to </=12 seconds w/ improved upright neutral in order to demonstrate decreased risk for falls and improved functional bilateral LE strength and power. Baseline: 15.13 sec w/ light BUE support and severe lumbar hyperextension on upright Goal status: INITIAL  LONG TERM GOALS: Target date: 03/24/2024  Pt will be independent and compliant with advanced and finalized strength and balance focused HEP in order to maintain functional progress and improve mobility. Baseline: To be established. Goal status: INITIAL  2.  Pt will demonstrate a gait speed of >/=4.08 feet/sec in order to decrease risk for falls. Baseline: 3.88 ft/sec Goal status: INITIAL  3.  Pt will ambulate >/=500 feet with LRAD at no more than mod I level of assist over unlevel/compliant surfaces, curb step and ramp with improved trunk stability to promote household and community access. Baseline: instability over level ground/SBA Goal status: INITIAL  4.  Pt will improve FGA score to >/=19/30 in order to demonstrate improved balance and decreased fall risk. Baseline: 13/30 (4/11) Goal status: INITIAL  ASSESSMENT:  CLINICAL IMPRESSION: Session limited today due to increased right hemibody symptoms and calf tenderness.  Pt presents with ongoing questions about recent lab values deferred to PCP.  PT facilitated same day visit for pt to be seen due to concerns for possible DVT and ongoing elevated BP.  Will reassess and  continue per POC as able at next scheduled visit.  OBJECTIVE IMPAIRMENTS: Abnormal gait, decreased activity tolerance, decreased balance, decreased coordination, decreased knowledge of condition, decreased knowledge of use of DME, decreased strength, increased muscle spasms, impaired sensation, improper body mechanics, postural dysfunction, and pain.   ACTIVITY LIMITATIONS: carrying, lifting, bending, standing, squatting, stairs, transfers, bed mobility, continence, bathing, dressing, self feeding, reach over head, and locomotion level  PARTICIPATION LIMITATIONS: meal prep, cleaning, laundry, driving, community activity, and occupation  PERSONAL FACTORS: Fitness, Past/current experiences, Transportation, and 3+ comorbidities: HTN, fibromyalgia, DM2  are  also affecting patient's functional outcome.   REHAB POTENTIAL: Good  CLINICAL DECISION MAKING: Evolving/moderate complexity  EVALUATION COMPLEXITY: Moderate  PLAN:  PT FREQUENCY: 1x/week  PT DURATION: 8 weeks  PLANNED INTERVENTIONS: 97164- PT Re-evaluation, 97110-Therapeutic exercises, 97530- Therapeutic activity, 97112- Neuromuscular re-education, 97535- Self Care, 16109- Manual therapy, [phone]- Gait training, [phone]- Orthotic Fit/training, [phone]- Aquatic Therapy, [phone]- Electrical stimulation (manual), Patient/Family education, Balance training, Stair training, Taping, Dry Needling, Joint mobilization, Spinal mobilization, Vestibular training, DME instructions, Cryotherapy, and Moist heat  PLAN FOR NEXT SESSION: How is HEP for strength and balance?  Bed mobility for improved efficiency.  Gait training.  Staggered squats.  Leg press, weighted vest w/ hurdles/dynamic stability tasks.  RLE NMR.  DVT?   Earlean Glaze, PT, DPT 02/09/2024, 11:41 AM

## 2024-02-10 ENCOUNTER — Ambulatory Visit: Admitting: "Endocrinology

## 2024-02-13 ENCOUNTER — Encounter: Payer: Self-pay | Admitting: Internal Medicine

## 2024-02-16 ENCOUNTER — Ambulatory Visit: Admitting: Physical Therapy

## 2024-02-16 ENCOUNTER — Encounter (HOSPITAL_BASED_OUTPATIENT_CLINIC_OR_DEPARTMENT_OTHER): Payer: Self-pay | Admitting: Internal Medicine

## 2024-02-16 ENCOUNTER — Encounter: Payer: Self-pay | Admitting: Physical Therapy

## 2024-02-16 VITALS — BP 162/97 | HR 78

## 2024-02-16 DIAGNOSIS — R2689 Other abnormalities of gait and mobility: Secondary | ICD-10-CM | POA: Diagnosis not present

## 2024-02-16 DIAGNOSIS — I10 Essential (primary) hypertension: Secondary | ICD-10-CM

## 2024-02-16 DIAGNOSIS — M6281 Muscle weakness (generalized): Secondary | ICD-10-CM

## 2024-02-16 DIAGNOSIS — I69351 Hemiplegia and hemiparesis following cerebral infarction affecting right dominant side: Secondary | ICD-10-CM | POA: Diagnosis not present

## 2024-02-16 DIAGNOSIS — G8191 Hemiplegia, unspecified affecting right dominant side: Secondary | ICD-10-CM | POA: Diagnosis not present

## 2024-02-16 DIAGNOSIS — R2681 Unsteadiness on feet: Secondary | ICD-10-CM

## 2024-02-16 DIAGNOSIS — Z5181 Encounter for therapeutic drug level monitoring: Secondary | ICD-10-CM

## 2024-02-16 DIAGNOSIS — I639 Cerebral infarction, unspecified: Secondary | ICD-10-CM | POA: Diagnosis not present

## 2024-02-16 NOTE — Therapy (Unsigned)
 OUTPATIENT PHYSICAL THERAPY NEURO TREATMENT   Patient Name: Amber Rose MRN: [id number] DOB:24-Aug-1976, 48 y.o., female Today's Date: 02/16/2024   PCP: Joaquin Mulberry, MD REFERRING PROVIDER: Hassie Lint, PA-C  END OF SESSION:  PT End of Session - 02/16/24 1112     Visit Number 4    Number of Visits 9   8 + eval   Date for PT Re-Evaluation 03/31/24   pushed out due to future multi-disciplinary needs   Authorization Type [employer] dual complete    PT Start Time 1107    PT Stop Time 1147    PT Time Calculation (min) 40 min    Equipment Utilized During Treatment Gait belt    Activity Tolerance Treatment limited secondary to medical complications (Comment);Patient limited by pain   HTN   Behavior During Therapy [facility] for tasks assessed/performed             Past Medical History:  Diagnosis Date   Abnormal Pap smear of cervix    yrs ago   Adenomyosis    Allergy     Anemia    Anxiety    Aortic atherosclerosis (HCC)    Arthritis    Asthma    Chronic UTI    Clostridium difficile infection    Depression    Diabetes mellitus without complication (HCC) 10/22/2023   A1C 8.8   Diverticulosis    Endometriosis    Fibroid    Fibromyalgia    GERD (gastroesophageal reflux disease)    Hiatal hernia    Hyperlipidemia    Hypertension    Internal hemorrhoids    Meningitis, viral    Migraines    Pancreatitis    Pure hypercholesterolemia 12/23/2020   Tubular adenoma of colon    Past Surgical History:  Procedure Laterality Date   APPENDECTOMY  1990   CERVICAL BIOPSY  W/ LOOP ELECTRODE EXCISION     LEEP   CESAREAN SECTION  2002   CHOLECYSTECTOMY  2011   COLONOSCOPY     UPPER GASTROINTESTINAL ENDOSCOPY     URINARY SURGERY     urethra sling, removal, and then revision 2016 (4 surgeries)   WISDOM TOOTH EXTRACTION     Patient Active Problem List   Diagnosis Date Noted   CVA (cerebral vascular accident) (HCC) 01/08/2024   Leukocytosis 01/08/2024   Type 2 diabetes  mellitus with chronic kidney disease, with long-term current use of insulin  (HCC) 01/08/2024   Hyperlipidemia 01/08/2024   GERD (gastroesophageal reflux disease) 01/08/2024   Overweight (BMI 25.0-29.9) 01/08/2024   Moderately severe major depression (HCC) 01/06/2024   Yeast vaginitis 08/30/2023   Iron  deficiency anemia 03/11/2023   PMDD (premenstrual dysphoric disorder) 11/17/2022   Abnormal uterine bleeding (AUB) 11/17/2022   Statin myopathy 09/23/2022   Steatorrhea 08/17/2022   Irregular menses 11/28/2021   Perimenopause 11/28/2021   Episode of recurrent major depressive disorder (HCC) 04/09/2021   BMI 33.0-33.9,adult 04/09/2021   Pure hypercholesterolemia 12/23/2020   Menorrhagia with regular cycle 09/13/2020   Well woman exam 09/13/2020   History of herpes zoster 01/17/2018   Overactive bladder 12/28/2017   Recurrent infections 11/19/2017   Cyst of nasal cavity 11/19/2017   Angio-edema 11/19/2017   Abdominal pain, epigastric 11/04/2017   Dysphagia 11/04/2017   Gastroesophageal reflux disease 07/12/2017   Degenerative disc disease at L5-S1 level 05/14/2017   Fibromyalgia 05/14/2017   ADD (attention deficit disorder) 05/14/2017   Urinary incontinence 04/05/2017   Thigh pain 02/02/2017   Anxiety and depression 02/02/2017   Tobacco abuse  02/02/2017   Facial flushing 02/02/2017   Hypertension 10/23/2016   Hidradenitis 10/23/2016   Diabetes (HCC) 09/12/2014   Lupus anticoagulant positive 07/03/2014   Vitamin D  deficiency 06/06/2014    ONSET DATE: 01/07/2024 (Lacunar CVA)  REFERRING DIAG: G81.91 (ICD-10-CM) - Right hemiparesis (HCC) I63.9 (ICD-10-CM) - Cerebrovascular accident (CVA), unspecified mechanism (HCC)  THERAPY DIAG:  Other abnormalities of gait and mobility  Muscle weakness (generalized)  Unsteadiness on feet  Hemiplegia and hemiparesis following cerebral infarction affecting right dominant side (HCC)  Rationale for Evaluation and Treatment:  Rehabilitation  SUBJECTIVE:                                                                                                                                                                                             SUBJECTIVE STATEMENT: Pt reports there was a mix up with her PCP appt and she was assessed by a nurse 4/16.  She states the nurse was not concerned with any signs or symptoms and that she mentioned her HTN, but she wasn't instructed to do anything about this.  She denies recent falls or near falls since prior visit. Pt accompanied by: self (family dropped her off)  PERTINENT HISTORY: HTN, anxiety, depression, fibromyalgia, herpes zoster, DM2, statin intolerance, thalamic CVA, CKD  PAIN:  Are you having pain? Yes: NPRS scale: 8 Pain location: right leg is more noticeable, right arm just feels "dead" but this is unchanged Pain description: cramping/stiffness/numbness in right hemibody Aggravating factors: using the arm, laying on the right Relieving factors: sitting still  PRECAUTIONS: Fall  RED FLAGS: Bowel or bladder incontinence: Yes: urinary incontinence - wearing depends today and MD aware per report    WEIGHT BEARING RESTRICTIONS: No  FALLS: Has patient fallen in last 6 months? No  LIVING ENVIRONMENT: Lives with: lives with an adult companion Lives in: House/apartment Stairs: Yes: External: 3 steps; none Has following equipment at home: Grab bars  PLOF: Independent with gait, Independent with transfers, Needs assistance with ADLs, and Needs assistance with homemaking  PATIENT GOALS: "Everything is really up top so I don't really know about PT."  OBJECTIVE:  Note: Objective measures were completed at Evaluation unless otherwise noted.  DIAGNOSTIC FINDINGS:  MR Brain 01/08/2024: IMPRESSION: Acute lacunar infarct in the left thalamus.  COGNITION: Overall cognitive status:  pt reports some difficulty with memory/losing her place with tasks due to  focus   SENSATION: Light touch: WFL - pt reports decreased R > L  COORDINATION: LE RAMS;  impaired Heel-to-shin:  WNL bilaterally  EDEMA:  WNL  MUSCLE TONE: Difficulty discerning flexion spasticity due to pt difficulty relaxing  POSTURE:  rounded shoulders and forward head  LOWER EXTREMITY ROM:     Active  Right Eval Left Eval  Hip flexion WNL WNL  Hip extension    Hip abduction    Hip adduction    Hip internal rotation    Hip external rotation    Knee flexion " "  Knee extension " "  Ankle dorsiflexion " "  Ankle plantarflexion    Ankle inversion    Ankle eversion     (Blank rows = not tested)  LOWER EXTREMITY MMT:    MMT Right Eval Left Eval  Hip flexion 4-/5 4+/5  Hip extension    Hip abduction    Hip adduction    Hip internal rotation    Hip external rotation    Knee flexion 4-/5 4+/5  Knee extension 4/5 5/5  Ankle dorsiflexion 4+/5 5/5  Ankle plantarflexion    Ankle inversion    Ankle eversion    (Blank rows = not tested)  BED MOBILITY:  Pt sleeps in standard bed and requires increased time and momentum to get roll and has difficulty managing bedding  TRANSFERS: Assistive device utilized: None  Sit to stand: SBA Stand to sit: SBA Chair to chair: SBA  GAIT: Gait pattern:  bilateral ER, step through pattern, decreased stride length, decreased hip/knee flexion- Right, decreased hip/knee flexion- Left, decreased ankle dorsiflexion- Right, decreased ankle dorsiflexion- Left, trendelenburg, and wide BOS Distance walked: various clinic distances Assistive device utilized: None Level of assistance: SBA Comments: Pt has severe drift from pathway to left then right when entering clinic, increased time to correct path with cuing.  Truncal ataxia?  FUNCTIONAL TESTS:  5 times sit to stand: 15.13 sec w/ light BUE support, severe hyperextension in standing, mild weight shift left during sitting Timed up and go (TUG): 11.28 sec SBA, no instability in  turning with small radius 10 meter walk test: 8.50 sec = 1.18 m/sec OR 3.88 ft/sec Functional gait assessment: To be assessed.  PATIENT SURVEYS:  None completed due to time.                                                                                                                              TREATMENT DATE: 02/16/2024  LUE IN SITTING PRIOR TO INTERVENTIONS: Vitals:   02/16/24 1110  BP: (!) 162/97  Pulse: 78   -Staggered squats (RLE in rear) 2x12 Leg Press (cues to improve quad engagement and form control w/ intermittent improvement):  -Bilateral x3 at 50 lbs > x5 at 60 lbs > 3x10 at 70 lbs  -Bilateral calf press x12 at 70 lbs > 2x12 at 80 lbs  -Single leg press 3x12 at 40 lbs, pt reports left feels stronger but declines recommendation of increasing weight -RLE SLS toe taps to 4th step using BUE > no UE support SBA x20  PATIENT EDUCATION: Education details: BP monitoring at home/logging if able.  Discussed contributors to HTN and need to monitor for  MD to determine if additional intervention needed.  Discussed chronicity of spasticity and need for daily stretching - she may want to discuss various medicine options to manage with physician to help with spasms during activity.  Weight bearing for cramp/spasticity management through arms on counter and standing for BLE.  Walking regularly to maintain strength and balance.  Sleep hygiene and importance of sleep positioning for comfort.  Nutrition - protein for general healing as pt states she is often not eating anything or very low calorie so as to not spike her BP. Person educated: Patient Education method: Explanation Education comprehension: verbalized understanding and needs further education  HOME EXERCISE PROGRAM: Access Code: [email or web address] URL: [email or web address] Date: 02/04/2024 Prepared by: Marilou Showman  Exercises - Sit to Stand with Armchair  - 1 x daily - 7 x weekly - 3 sets - 10 reps - Corner Balance  Feet Together With Eyes Open  - 1 x daily - 5 x weekly - 1 sets - 3-4 reps - 30 seconds hold - Corner Balance Feet Together With Eyes Closed  - 1 x daily - 5 x weekly - 1 sets - 3-4 reps - 30 seconds hold - Corner Balance Feet Together: Eyes Open With Head Turns  - 1 x daily - 5 x weekly - 1 sets - 3-4 reps - 30 seconds hold - Romberg Stance with Head Nods  - 1 x daily - 5 x weekly - 2-3 sets - 10 reps - Tandem Walking with Counter Support  - 1 x daily - 5 x weekly - 3 sets - 10 reps  GOALS: Goals reviewed with patient? Yes  SHORT TERM GOALS: Target date: 02/25/2024  Pt will be independent and compliant with initial strength and balance focused HEP in order to maintain functional progress and improve mobility. Baseline:  To be established. Goal status: INITIAL  2.  Pt will decrease 5xSTS to </=12 seconds w/ improved upright neutral in order to demonstrate decreased risk for falls and improved functional bilateral LE strength and power. Baseline: 15.13 sec w/ light BUE support and severe lumbar hyperextension on upright Goal status: INITIAL  LONG TERM GOALS: Target date: 03/24/2024  Pt will be independent and compliant with advanced and finalized strength and balance focused HEP in order to maintain functional progress and improve mobility. Baseline: To be established. Goal status: INITIAL  2.  Pt will demonstrate a gait speed of >/=4.08 feet/sec in order to decrease risk for falls. Baseline: 3.88 ft/sec Goal status: INITIAL  3.  Pt will ambulate >/=500 feet with LRAD at no more than mod I level of assist over unlevel/compliant surfaces, curb step and ramp with improved trunk stability to promote household and community access. Baseline: instability over level ground/SBA Goal status: INITIAL  4.  Pt will improve FGA score to >/=19/30 in order to demonstrate improved balance and decreased fall risk. Baseline: 13/30 (4/11) Goal status: INITIAL  ASSESSMENT:  CLINICAL  IMPRESSION: Session limited today due to increased right hemibody symptoms and calf tenderness.  Pt presents with ongoing questions about recent lab values deferred to PCP.  PT facilitated same day visit for pt to be seen due to concerns for possible DVT and ongoing elevated BP.  Will reassess and continue per POC as able at next scheduled visit.  OBJECTIVE IMPAIRMENTS: Abnormal gait, decreased activity tolerance, decreased balance, decreased coordination, decreased knowledge of condition, decreased knowledge of use of DME, decreased strength, increased muscle spasms, impaired sensation, improper body mechanics, postural dysfunction, and pain.  ACTIVITY LIMITATIONS: carrying, lifting, bending, standing, squatting, stairs, transfers, bed mobility, continence, bathing, dressing, self feeding, reach over head, and locomotion level  PARTICIPATION LIMITATIONS: meal prep, cleaning, laundry, driving, community activity, and occupation  PERSONAL FACTORS: Fitness, Past/current experiences, Transportation, and 3+ comorbidities: HTN, fibromyalgia, DM2  are also affecting patient's functional outcome.   REHAB POTENTIAL: Good  CLINICAL DECISION MAKING: Evolving/moderate complexity  EVALUATION COMPLEXITY: Moderate  PLAN:  PT FREQUENCY: 1x/week  PT DURATION: 8 weeks  PLANNED INTERVENTIONS: 97164- PT Re-evaluation, 97110-Therapeutic exercises, 97530- Therapeutic activity, 97112- Neuromuscular re-education, 97535- Self Care, 14782- Manual therapy, [phone]- Gait training, [phone]- Orthotic Fit/training, [phone]- Aquatic Therapy, [phone]- Electrical stimulation (manual), Patient/Family education, Balance training, Stair training, Taping, Dry Needling, Joint mobilization, Spinal mobilization, Vestibular training, DME instructions, Cryotherapy, and Moist heat  PLAN FOR NEXT SESSION: How is HEP for strength and balance?  Bed mobility for improved efficiency.  Gait training.  weighted vest w/ hurdles/dynamic stability  tasks.  RLE NMR.  Blaze pods.   Earlean Glaze, PT, DPT 02/16/2024, 11:48 AM

## 2024-02-17 NOTE — Therapy (Incomplete)
 OUTPATIENT PHYSICAL THERAPY NEURO TREATMENT   Patient Name: Amber Rose MRN: [id number] DOB:15-Jun-1976, 48 y.o., female Today's Date: 02/16/2024   PCP: Joaquin Mulberry, MD REFERRING PROVIDER: Hassie Lint, PA-C  END OF SESSION:  PT End of Session - 02/16/24 1112     Visit Number 4    Number of Visits 9   8 + eval   Date for PT Re-Evaluation 03/31/24   pushed out due to future multi-disciplinary needs   Authorization Type [employer] dual complete    PT Start Time 1107    PT Stop Time 1147    PT Time Calculation (min) 40 min    Equipment Utilized During Treatment Gait belt    Activity Tolerance Treatment limited secondary to medical complications (Comment);Patient limited by pain   HTN   Behavior During Therapy [facility] for tasks assessed/performed             Past Medical History:  Diagnosis Date  . Abnormal Pap smear of cervix    yrs ago  . Adenomyosis   . Allergy    . Anemia   . Anxiety   . Aortic atherosclerosis (HCC)   . Arthritis   . Asthma   . Chronic UTI   . Clostridium difficile infection   . Depression   . Diabetes mellitus without complication (HCC) 10/22/2023   A1C 8.8  . Diverticulosis   . Endometriosis   . Fibroid   . Fibromyalgia   . GERD (gastroesophageal reflux disease)   . Hiatal hernia   . Hyperlipidemia   . Hypertension   . Internal hemorrhoids   . Meningitis, viral   . Migraines   . Pancreatitis   . Pure hypercholesterolemia 12/23/2020  . Tubular adenoma of colon    Past Surgical History:  Procedure Laterality Date  . APPENDECTOMY  1990  . CERVICAL BIOPSY  W/ LOOP ELECTRODE EXCISION     LEEP  . CESAREAN SECTION  2002  . CHOLECYSTECTOMY  2011  . COLONOSCOPY    . UPPER GASTROINTESTINAL ENDOSCOPY    . URINARY SURGERY     urethra sling, removal, and then revision 2016 (4 surgeries)  . WISDOM TOOTH EXTRACTION     Patient Active Problem List   Diagnosis Date Noted  . CVA (cerebral vascular accident) (HCC) 01/08/2024  .  Leukocytosis 01/08/2024  . Type 2 diabetes mellitus with chronic kidney disease, with long-term current use of insulin  (HCC) 01/08/2024  . Hyperlipidemia 01/08/2024  . GERD (gastroesophageal reflux disease) 01/08/2024  . Overweight (BMI 25.0-29.9) 01/08/2024  . Moderately severe major depression (HCC) 01/06/2024  . Yeast vaginitis 08/30/2023  . Iron  deficiency anemia 03/11/2023  . PMDD (premenstrual dysphoric disorder) 11/17/2022  . Abnormal uterine bleeding (AUB) 11/17/2022  . Statin myopathy 09/23/2022  . Steatorrhea 08/17/2022  . Irregular menses 11/28/2021  . Perimenopause 11/28/2021  . Episode of recurrent major depressive disorder (HCC) 04/09/2021  . BMI 33.0-33.9,adult 04/09/2021  . Pure hypercholesterolemia 12/23/2020  . Menorrhagia with regular cycle 09/13/2020  . Well woman exam 09/13/2020  . History of herpes zoster 01/17/2018  . Overactive bladder 12/28/2017  . Recurrent infections 11/19/2017  . Cyst of nasal cavity 11/19/2017  . Angio-edema 11/19/2017  . Abdominal pain, epigastric 11/04/2017  . Dysphagia 11/04/2017  . Gastroesophageal reflux disease 07/12/2017  . Degenerative disc disease at L5-S1 level 05/14/2017  . Fibromyalgia 05/14/2017  . ADD (attention deficit disorder) 05/14/2017  . Urinary incontinence 04/05/2017  . Thigh pain 02/02/2017  . Anxiety and depression 02/02/2017  . Tobacco abuse  02/02/2017  . Facial flushing 02/02/2017  . Hypertension 10/23/2016  . Hidradenitis 10/23/2016  . Diabetes (HCC) 09/12/2014  . Lupus anticoagulant positive 07/03/2014  . Vitamin D  deficiency 06/06/2014    ONSET DATE: 01/07/2024 (Lacunar CVA)  REFERRING DIAG: G81.91 (ICD-10-CM) - Right hemiparesis (HCC) I63.9 (ICD-10-CM) - Cerebrovascular accident (CVA), unspecified mechanism (HCC)  THERAPY DIAG:  Other abnormalities of gait and mobility  Muscle weakness (generalized)  Unsteadiness on feet  Hemiplegia and hemiparesis following cerebral infarction affecting  right dominant side (HCC)  Rationale for Evaluation and Treatment: Rehabilitation  SUBJECTIVE:                                                                                                                                                                                             SUBJECTIVE STATEMENT: Pt reports there was a mix up with her PCP appt and she was assessed by a nurse 4/16.  She states the nurse was not concerned with any signs or symptoms and that she mentioned her HTN, but she wasn't instructed to do anything about this.  She denies recent falls or near falls since prior visit. Pt accompanied by: self (family dropped her off)  PERTINENT HISTORY: HTN, anxiety, depression, fibromyalgia, herpes zoster, DM2, statin intolerance, thalamic CVA, CKD  PAIN:  Are you having pain? Yes: NPRS scale: 8 Pain location: right leg is more noticeable, right arm just feels "dead" but this is unchanged Pain description: cramping/stiffness/numbness in right hemibody Aggravating factors: using the arm, laying on the right Relieving factors: sitting still  PRECAUTIONS: Fall  RED FLAGS: Bowel or bladder incontinence: Yes: urinary incontinence - wearing depends today and MD aware per report    WEIGHT BEARING RESTRICTIONS: No  FALLS: Has patient fallen in last 6 months? No  LIVING ENVIRONMENT: Lives with: lives with an adult companion Lives in: House/apartment Stairs: Yes: External: 3 steps; none Has following equipment at home: Grab bars  PLOF: Independent with gait, Independent with transfers, Needs assistance with ADLs, and Needs assistance with homemaking  PATIENT GOALS: "Everything is really up top so I don't really know about PT."  OBJECTIVE:  Note: Objective measures were completed at Evaluation unless otherwise noted.  DIAGNOSTIC FINDINGS:  MR Brain 01/08/2024: IMPRESSION: Acute lacunar infarct in the left thalamus.  COGNITION: Overall cognitive status:  pt reports some  difficulty with memory/losing her place with tasks due to focus   SENSATION: Light touch: WFL - pt reports decreased R > L  COORDINATION: LE RAMS;  impaired Heel-to-shin:  WNL bilaterally  EDEMA:  WNL  MUSCLE TONE: Difficulty discerning flexion spasticity due to pt difficulty relaxing  POSTURE:  rounded shoulders and forward head  LOWER EXTREMITY ROM:     Active  Right Eval Left Eval  Hip flexion WNL WNL  Hip extension    Hip abduction    Hip adduction    Hip internal rotation    Hip external rotation    Knee flexion " "  Knee extension " "  Ankle dorsiflexion " "  Ankle plantarflexion    Ankle inversion    Ankle eversion     (Blank rows = not tested)  LOWER EXTREMITY MMT:    MMT Right Eval Left Eval  Hip flexion 4-/5 4+/5  Hip extension    Hip abduction    Hip adduction    Hip internal rotation    Hip external rotation    Knee flexion 4-/5 4+/5  Knee extension 4/5 5/5  Ankle dorsiflexion 4+/5 5/5  Ankle plantarflexion    Ankle inversion    Ankle eversion    (Blank rows = not tested)  BED MOBILITY:  Pt sleeps in standard bed and requires increased time and momentum to get roll and has difficulty managing bedding  TRANSFERS: Assistive device utilized: None  Sit to stand: SBA Stand to sit: SBA Chair to chair: SBA  GAIT: Gait pattern:  bilateral ER, step through pattern, decreased stride length, decreased hip/knee flexion- Right, decreased hip/knee flexion- Left, decreased ankle dorsiflexion- Right, decreased ankle dorsiflexion- Left, trendelenburg, and wide BOS Distance walked: various clinic distances Assistive device utilized: None Level of assistance: SBA Comments: Pt has severe drift from pathway to left then right when entering clinic, increased time to correct path with cuing.  Truncal ataxia?  FUNCTIONAL TESTS:  5 times sit to stand: 15.13 sec w/ light BUE support, severe hyperextension in standing, mild weight shift left during  sitting Timed up and go (TUG): 11.28 sec SBA, no instability in turning with small radius 10 meter walk test: 8.50 sec = 1.18 m/sec OR 3.88 ft/sec Functional gait assessment: To be assessed.  PATIENT SURVEYS:  None completed due to time.                                                                                                                              TREATMENT DATE: 02/16/2024  LUE IN SITTING PRIOR TO INTERVENTIONS: Vitals:   02/16/24 1110  BP: (!) 162/97  Pulse: 78   -Staggered squats (RLE in rear) 2x12 Leg Press (cues to improve quad engagement and form control w/ intermittent improvement):  -Bilateral x3 at 50 lbs > x5 at 60 lbs > 3x10 at 70 lbs  -Bilateral calf press x12 at 70 lbs > 2x12 at 80 lbs  -Single leg press 3x12 at 40 lbs, pt reports left feels stronger but declines recommendation of increasing weight -RLE SLS toe taps to 4th step using BUE > no UE support SBA x20  PATIENT EDUCATION: Education details: BP monitoring at home/logging if able.  Discussed contributors to HTN and need to monitor for  MD to determine if additional intervention needed.  Discussed chronicity of spasticity and need for daily stretching - she may want to discuss various medicine options to manage with physician to help with spasms during activity.  Weight bearing for cramp/spasticity management through arms on counter and standing for BLE.  Walking regularly to maintain strength and balance.  Sleep hygiene and importance of sleep positioning for comfort.  Nutrition - protein for general healing as pt states she is often not eating anything or very low calorie so as to not spike her BP. Person educated: Patient Education method: Explanation Education comprehension: verbalized understanding and needs further education  HOME EXERCISE PROGRAM: Access Code: [email or web address] URL: [email or web address] Date: 02/04/2024 Prepared by: Marilou Showman  Exercises - Sit to Stand with  Armchair  - 1 x daily - 7 x weekly - 3 sets - 10 reps - Corner Balance Feet Together With Eyes Open  - 1 x daily - 5 x weekly - 1 sets - 3-4 reps - 30 seconds hold - Corner Balance Feet Together With Eyes Closed  - 1 x daily - 5 x weekly - 1 sets - 3-4 reps - 30 seconds hold - Corner Balance Feet Together: Eyes Open With Head Turns  - 1 x daily - 5 x weekly - 1 sets - 3-4 reps - 30 seconds hold - Romberg Stance with Head Nods  - 1 x daily - 5 x weekly - 2-3 sets - 10 reps - Tandem Walking with Counter Support  - 1 x daily - 5 x weekly - 3 sets - 10 reps  GOALS: Goals reviewed with patient? Yes  SHORT TERM GOALS: Target date: 02/25/2024  Pt will be independent and compliant with initial strength and balance focused HEP in order to maintain functional progress and improve mobility. Baseline:  To be established. Goal status: INITIAL  2.  Pt will decrease 5xSTS to </=12 seconds w/ improved upright neutral in order to demonstrate decreased risk for falls and improved functional bilateral LE strength and power. Baseline: 15.13 sec w/ light BUE support and severe lumbar hyperextension on upright Goal status: INITIAL  LONG TERM GOALS: Target date: 03/24/2024  Pt will be independent and compliant with advanced and finalized strength and balance focused HEP in order to maintain functional progress and improve mobility. Baseline: To be established. Goal status: INITIAL  2.  Pt will demonstrate a gait speed of >/=4.08 feet/sec in order to decrease risk for falls. Baseline: 3.88 ft/sec Goal status: INITIAL  3.  Pt will ambulate >/=500 feet with LRAD at no more than mod I level of assist over unlevel/compliant surfaces, curb step and ramp with improved trunk stability to promote household and community access. Baseline: instability over level ground/SBA Goal status: INITIAL  4.  Pt will improve FGA score to >/=19/30 in order to demonstrate improved balance and decreased fall risk. Baseline: 13/30  (4/11) Goal status: INITIAL  ASSESSMENT:  CLINICAL IMPRESSION: Emphasis of skilled session today on LE strengthening and symmetrical motor control.  He tolerance to activity is some improved today as right calf symptoms have resolved since visit prior.  Some time spent this session encouraging techniques for spasticity management as well as sleep and general nutrition for prolonged healing.   OBJECTIVE IMPAIRMENTS: Abnormal gait, decreased activity tolerance, decreased balance, decreased coordination, decreased knowledge of condition, decreased knowledge of use of DME, decreased strength, increased muscle spasms, impaired sensation, improper body mechanics, postural dysfunction, and pain.   ACTIVITY LIMITATIONS: carrying, lifting, bending, standing,  squatting, stairs, transfers, bed mobility, continence, bathing, dressing, self feeding, reach over head, and locomotion level  PARTICIPATION LIMITATIONS: meal prep, cleaning, laundry, driving, community activity, and occupation  PERSONAL FACTORS: Fitness, Past/current experiences, Transportation, and 3+ comorbidities: HTN, fibromyalgia, DM2  are also affecting patient's functional outcome.   REHAB POTENTIAL: Good  CLINICAL DECISION MAKING: Evolving/moderate complexity  EVALUATION COMPLEXITY: Moderate  PLAN:  PT FREQUENCY: 1x/week  PT DURATION: 8 weeks  PLANNED INTERVENTIONS: 97164- PT Re-evaluation, 97110-Therapeutic exercises, 97530- Therapeutic activity, 97112- Neuromuscular re-education, 97535- Self Care, 16109- Manual therapy, [phone]- Gait training, [phone]- Orthotic Fit/training, [phone]- Aquatic Therapy, [phone]- Electrical stimulation (manual), Patient/Family education, Balance training, Stair training, Taping, Dry Needling, Joint mobilization, Spinal mobilization, Vestibular training, DME instructions, Cryotherapy, and Moist heat  PLAN FOR NEXT SESSION: How is HEP for strength and balance?  Bed mobility for improved efficiency.  Gait  training.  weighted vest w/ hurdles/dynamic stability tasks.  RLE NMR.  Blaze pods.   Earlean Glaze, PT, DPT 02/16/2024, 11:48 AM

## 2024-02-21 ENCOUNTER — Ambulatory Visit: Payer: 59 | Admitting: Dietician

## 2024-02-21 MED ORDER — INDAPAMIDE 2.5 MG PO TABS: 2.5000 mg | ORAL_TABLET | Freq: Every day | ORAL | 3 refills | Status: DC

## 2024-02-21 NOTE — Telephone Encounter (Signed)
 Labs ordered and Rx sent to [facility] Patient read message from Dr Theodis Fiscal 4/25

## 2024-02-22 ENCOUNTER — Encounter: Payer: Self-pay | Admitting: "Endocrinology

## 2024-02-22 NOTE — Therapy (Unsigned)
 OUTPATIENT SPEECH LANGUAGE PATHOLOGY EVALUATION   Patient Name: Amber Rose MRN: [id number] DOB:October 10, 1976, 48 y.o., female Today's Date: 02/23/2024  PCP: Joaquin Mulberry MD REFERRING PROVIDER: Johny Nap, NP  END OF SESSION:  End of Session - 02/23/24 1146     Visit Number 1    Number of Visits 9    Date for SLP Re-Evaluation 04/19/24    Authorization Type [employer] Medicare    SLP Start Time 1015    SLP Stop Time  1100    SLP Time Calculation (min) 45 min    Activity Tolerance Patient tolerated treatment well             Past Medical History:  Diagnosis Date   Abnormal Pap smear of cervix    yrs ago   Adenomyosis    Allergy     Anemia    Anxiety    Aortic atherosclerosis (HCC)    Arthritis    Asthma    Chronic UTI    Clostridium difficile infection    Depression    Diabetes mellitus without complication (HCC) 10/22/2023   A1C 8.8   Diverticulosis    Endometriosis    Fibroid    Fibromyalgia    GERD (gastroesophageal reflux disease)    Hiatal hernia    Hyperlipidemia    Hypertension    Internal hemorrhoids    Meningitis, viral    Migraines    Pancreatitis    Pure hypercholesterolemia 12/23/2020   Tubular adenoma of colon    Past Surgical History:  Procedure Laterality Date   APPENDECTOMY  1990   CERVICAL BIOPSY  W/ LOOP ELECTRODE EXCISION     LEEP   CESAREAN SECTION  2002   CHOLECYSTECTOMY  2011   COLONOSCOPY     UPPER GASTROINTESTINAL ENDOSCOPY     URINARY SURGERY     urethra sling, removal, and then revision 2016 (4 surgeries)   WISDOM TOOTH EXTRACTION     Patient Active Problem List   Diagnosis Date Noted   CVA (cerebral vascular accident) (HCC) 01/08/2024   Leukocytosis 01/08/2024   Type 2 diabetes mellitus with chronic kidney disease, with long-term current use of insulin  (HCC) 01/08/2024   Hyperlipidemia 01/08/2024   GERD (gastroesophageal reflux disease) 01/08/2024   Overweight (BMI 25.0-29.9) 01/08/2024   Moderately severe  major depression (HCC) 01/06/2024   Yeast vaginitis 08/30/2023   Iron  deficiency anemia 03/11/2023   PMDD (premenstrual dysphoric disorder) 11/17/2022   Abnormal uterine bleeding (AUB) 11/17/2022   Statin myopathy 09/23/2022   Steatorrhea 08/17/2022   Irregular menses 11/28/2021   Perimenopause 11/28/2021   Episode of recurrent major depressive disorder (HCC) 04/09/2021   BMI 33.0-33.9,adult 04/09/2021   Pure hypercholesterolemia 12/23/2020   Menorrhagia with regular cycle 09/13/2020   Well woman exam 09/13/2020   History of herpes zoster 01/17/2018   Overactive bladder 12/28/2017   Recurrent infections 11/19/2017   Cyst of nasal cavity 11/19/2017   Angio-edema 11/19/2017   Abdominal pain, epigastric 11/04/2017   Dysphagia 11/04/2017   Gastroesophageal reflux disease 07/12/2017   Degenerative disc disease at L5-S1 level 05/14/2017   Fibromyalgia 05/14/2017   ADD (attention deficit disorder) 05/14/2017   Urinary incontinence 04/05/2017   Thigh pain 02/02/2017   Anxiety and depression 02/02/2017   Tobacco abuse 02/02/2017   Facial flushing 02/02/2017   Hypertension 10/23/2016   Hidradenitis 10/23/2016   Diabetes (HCC) 09/12/2014   Lupus anticoagulant positive 07/03/2014   Vitamin D  deficiency 06/06/2014    ONSET DATE: 01/31/2024 (referral date)   REFERRING  DIAG: I63.81 (ICD-10-CM) - Thalamic stroke (HCC) I69.319 (ICD-10-CM) - Cognitive deficit, post-stroke  THERAPY DIAG:  Cognitive communication deficit  Rationale for Evaluation and Treatment: Rehabilitation  SUBJECTIVE:   SUBJECTIVE STATEMENT: "a little (difficulty) but improved" re: cognitive changes Pt accompanied by: self  PERTINENT HISTORY: "[name] is a 48 y.o. female with history of DM2, GERD, HTN, HLD, and current tobacco smoking who presented to ED on 01/07/2024 with right face, arm, and leg numbness.  Stroke workup revealed left thalamic infarct secondary to small vessel disease with multiple  uncontrolled stroke risk factors.  CTA head/neck negative LVO, noted premature arthrosclerosis for age.  EF 60 to 65%.  LDL 175.  A1c 6.4.  Recommended DAPT for 3 weeks and aspirin  alone.  Reported significant intolerance to statin medications as well as Repatha , recommended consideration of Leqvio.  DM and HTN controlled on home regimen.  Current tobacco and THC use with cessation counseling provided.  Therapies recommended outpatient PT/OT.   Today, 01/31/2024, patient is being seen for initial hospital follow-up unaccompanied.  Reports residual right-sided numbness, weakness and cognitive difficulty. Reports some improvement of symptoms since discharge. Had initial PT evaluation recently and has f/u visit 4/11.  Interested in pursuing OT due to difficulty with right hand fine motor control and dexterity.  She occasionally feels muscle spasms in her right arm.  PCP started Robaxin  which helped some.  She is also on pregabalin  for neuropathy.  Reports mild short-term memory difficulties since her stroke, will easily lose her train of thought during a conversation and can have delayed recall.  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  She has been on SSI prestroke for fibromyalgia, mood disorder and incontinence post surgical procedure. She did drive yesterday short distance and felt she did okay. She is able to maintain ADLs and majority of IADLs independently.  Denies new stroke/TIA symptoms."  PAIN:  Are you having pain? Yes: NPRS scale: 8/10 Pain location: right side  Pain description: spasms, aching Aggravating factors: unknown Relieving factors: none  FALLS: Has patient fallen in last 6 months?  No  LIVING ENVIRONMENT: Lives with: lives with an adult companion Lives in: House/apartment  PLOF:  Level of assistance: Independent with ADLs, Independent with  IADLs Employment: On disability  PATIENT GOALS: maximize return to baseline   OBJECTIVE:  Note: Objective measures were completed at Evaluation unless otherwise noted.  COGNITION: Overall cognitive status: Impaired Areas of impairment:  Attention: Impaired: Sustained, Alternating, Divided Memory: Impaired: Immediate Short term Prospective Awareness: Impaired: Emergent and Anticipatory Executive function: Impaired: Slow processing Functional deficits: Endorsed some difficulty with immediate recall if pressure present, challenges with multi-tasking, forgetting items daily, and getting distracted while watching TV/movie.   COGNITIVE COMMUNICATION: Following directions: Follows multi-step commands with increased time  Auditory comprehension: WFL Functional communication: Impaired: endorsed occasional stutter, slower rate of speech, and rare word finding episodes  ORAL MOTOR EXAMINATION: Overall status: WFL  STANDARDIZED ASSESSMENTS: CLQT: Attention: WNL, Memory: WNL, Executive Function: WNL, Language: WNL, Visuospatial Skills: WNL, and Clock Drawing: Mild  PATIENT REPORTED OUTCOME MEASURES (PROM): Not completed d/t time constraints  TREATMENT DATE:  02/23/24: ST evaluation and POC complete. Educated patient on parts of CLQT subtests with errors, including clock drawing and story recall. Was previously unaware of errors made during assessment. Provided recommendation to aid recall of medication management, including setting alarms/timers/reminders to support recall given inconsistent administration reported. Pt verbalized agreement.     PATIENT EDUCATION: Education details: see above Person educated: Patient Education method: Explanation Education comprehension: verbalized understanding and needs further education   GOALS: Goals reviewed with patient?  Yes  SHORT TERM GOALS: Target date: 03/22/2024  Pt will successfully recall medication for 2/2 opportunities with use of external memory support  Baseline: Goal status: INITIAL  2.  Pt will carryover memory compensations to aid recall at home for 2/2 opportunities (ex: locating items) Baseline:  Goal status: INITIAL  3.  Pt will successfully utilize attention strategies to maintain attention for extended periods (I.e., show/movie) for 2/2 opportunities Baseline:  Goal status: INITIAL   LONG TERM GOALS: Target date: 04/19/2024  Pt will successfully utilize cognitive compensations to manage iADLs with no errors reported >1 week  Baseline:  Goal status: INITIAL  2.  Pt will successfully complete 2-3 daily tasks with use of cognitive compensations > 1 week  Baseline:  Goal status: INITIAL  3.  Pt will maintain WNL communication x2 sessions given rare min A  Baseline:  Goal status: INITIAL  4. Pt will improve self-rating of cognitive functioning by at least 1 point by LTG date  Baseline: self-rated 7/10  Goal Status: INITIAL  ASSESSMENT:  CLINICAL IMPRESSION: Patient is a 48 y.o. F who was seen today for ST evaluation s/p  stroke in March 2025. Endorsed mild changes in memory multi-tasking, and concentration as well occasional stutter. CLQT score was WNL but recall, attention, and executive function deficits evidenced impacting subtest scores (story retell, symbol trials, clock drawing, and design generation). Self-rated current cognitive functioning as 7/10 (10 being baseline). This aforementioned deficits have impacted her medication management, household tasks, and engagement in prior hobbies. Pt would benefit from skilled ST intervention to maximize return to baseline and overall QoL.   OBJECTIVE IMPAIRMENTS: include attention, memory, awareness, executive functioning, and expressive language. These impairments are limiting patient from household responsibilities and effectively  communicating at home and in community.Factors affecting potential to achieve goals and functional outcome are previous level of function.. Patient will benefit from skilled SLP services to address above impairments and improve overall function.  REHAB POTENTIAL: Good  PLAN:  SLP FREQUENCY: 1x/week  SLP DURATION: 8 weeks  PLANNED INTERVENTIONS: Language facilitation, Environmental controls, Cueing hierachy, Cognitive reorganization, Functional tasks, SLP instruction and feedback, Compensatory strategies, Patient/family education, [phone] Treatment of speech (30 or 45 min) , and 60454- Speech Eval Sound Prod, Artic, Phon, Eval Compre, Express    Tamar Fairly, CCC-SLP 02/23/2024, 5:06 PM

## 2024-02-23 ENCOUNTER — Encounter: Payer: Self-pay | Admitting: Physical Therapy

## 2024-02-23 ENCOUNTER — Encounter: Payer: Self-pay | Admitting: "Endocrinology

## 2024-02-23 ENCOUNTER — Ambulatory Visit

## 2024-02-23 ENCOUNTER — Telehealth (INDEPENDENT_AMBULATORY_CARE_PROVIDER_SITE_OTHER): Admitting: "Endocrinology

## 2024-02-23 ENCOUNTER — Ambulatory Visit (HOSPITAL_BASED_OUTPATIENT_CLINIC_OR_DEPARTMENT_OTHER): Admitting: Physical Therapy

## 2024-02-23 ENCOUNTER — Ambulatory Visit: Admitting: Physical Therapy

## 2024-02-23 VITALS — BP 129/109 | HR 94

## 2024-02-23 DIAGNOSIS — Z7985 Long-term (current) use of injectable non-insulin antidiabetic drugs: Secondary | ICD-10-CM | POA: Diagnosis not present

## 2024-02-23 DIAGNOSIS — Z7984 Long term (current) use of oral hypoglycemic drugs: Secondary | ICD-10-CM

## 2024-02-23 DIAGNOSIS — I639 Cerebral infarction, unspecified: Secondary | ICD-10-CM | POA: Diagnosis not present

## 2024-02-23 DIAGNOSIS — Z794 Long term (current) use of insulin: Secondary | ICD-10-CM

## 2024-02-23 DIAGNOSIS — R2681 Unsteadiness on feet: Secondary | ICD-10-CM

## 2024-02-23 DIAGNOSIS — E782 Mixed hyperlipidemia: Secondary | ICD-10-CM

## 2024-02-23 DIAGNOSIS — E11649 Type 2 diabetes mellitus with hypoglycemia without coma: Secondary | ICD-10-CM

## 2024-02-23 DIAGNOSIS — R2689 Other abnormalities of gait and mobility: Secondary | ICD-10-CM

## 2024-02-23 DIAGNOSIS — I69351 Hemiplegia and hemiparesis following cerebral infarction affecting right dominant side: Secondary | ICD-10-CM | POA: Diagnosis not present

## 2024-02-23 DIAGNOSIS — M6281 Muscle weakness (generalized): Secondary | ICD-10-CM | POA: Diagnosis not present

## 2024-02-23 DIAGNOSIS — G8191 Hemiplegia, unspecified affecting right dominant side: Secondary | ICD-10-CM | POA: Diagnosis not present

## 2024-02-23 DIAGNOSIS — R41841 Cognitive communication deficit: Secondary | ICD-10-CM

## 2024-02-23 NOTE — Progress Notes (Signed)
 The patient reports they are currently: Amber Rose. I spent 6-7 minutes on the video with the patient on the date of service. I spent an additional 5 minutes on pre- and post-visit activities on the date of service.   The patient was physically located in [place]  or a state in which I am permitted to provide care. The patient and/or parent/guardian understood that s/he may incur co-pays and cost sharing, and agreed to the telemedicine visit. The visit was reasonable and appropriate under the circumstances given the patient's presentation at the time.  The patient and/or parent/guardian has been advised of the potential risks and limitations of this mode of treatment (including, but not limited to, the absence of in-person examination) and has agreed to be treated using telemedicine. The patient's/patient's family's questions regarding telemedicine have been answered.   The patient and/or parent/guardian has also been advised to contact their provider's office for worsening conditions, and seek emergency medical treatment and/or call 911 if the patient deems either necessary.     Outpatient Endocrinology Note Jorge Newcomer, MD  02/23/24   Amber Rose Oct 08, 1976 [id number]  Referring Provider: Joaquin Mulberry, MD Primary Care Provider: Joaquin Mulberry, MD Reason for consultation: Subjective   Assessment & Plan  Diagnoses and all orders for this visit:  Uncontrolled type 2 diabetes mellitus with hypoglycemia without coma (HCC)  Long term (current) use of oral hypoglycemic drugs  Long-term insulin  use (HCC)  Long-term (current) use of injectable non-insulin  antidiabetic drugs  Mixed hypercholesterolemia and hypertriglyceridemia    Diabetes Type II complicated by hyperglycemia,  Lab Results  Component Value Date   GFR 106.09 02/20/2022   Hba1c goal less than 7, current Hba1c is  Lab Results  Component Value Date   HGBA1C 6.4 (H) 01/08/2024   Will recommend the  following: Lantus  14 units qpm (back off further by 2 units everyday as long as BG drop <100) Metformin  500mg  2 tabs bid Ozempic  1 mg per week  Humalog  scale: Use it 15 min before you eat: Space it out by 4 hours.  201 - 225: 3 units 226 - 250: 4 units 251 - 275: 5 units 276 - 300: 6 units 301 - 325: 7 units 326 - 350: 8 units 351 - 375: 9 units 376 - 400: 10 units   Ordered DM education previously   No known contraindications/side effects to any of above medications No history of MEN syndrome/medullary thyroid  cancer/pancreatitis or pancreatic cancer in self or family Stopped ozempic  2mg  3 mo ago due to weight loss-lost 30-40 lbs  Will attempt max dose of ozempic  at 1 mg to avoid excess weight loss   -Last LD and Tg are as follows: Lab Results  Component Value Date   LDLCALC 175 (H) 01/08/2024    Lab Results  Component Value Date   TRIG 165 (H) 01/08/2024   -not on statin, not interested after detailed discussion, says she has the shot approved, scared to take it-counseled pt -Follow low fat diet and exercise   -Blood pressure goal <140/90 - Microalbumin/creatinine goal is < 30 -Last MA/Cr is as follows: Lab Results  Component Value Date   MICROALBUR 1.3 11/22/2023   -not on ACE/ARB  -diet changes including salt restriction -limit eating outside -counseled BP targets per standards of diabetes care -uncontrolled blood pressure can lead to retinopathy, nephropathy and cardiovascular and atherosclerotic heart disease  Reviewed and counseled on: -A1C target -Blood sugar targets -Complications of uncontrolled diabetes  -Checking blood sugar before meals and bedtime  and bring log next visit -All medications with mechanism of action and side effects -Hypoglycemia management: rule of 15's, Glucagon Emergency Kit and medical alert ID -low-carb low-fat plate-method diet -At least 20 minutes of physical activity per day -Annual dilated retinal eye exam and foot  exam -compliance and follow up needs -follow up as scheduled or earlier if problem gets worse  Call if blood sugar is less than 70 or consistently above 250    Take a 15 gm snack of carbohydrate at bedtime before you go to sleep if your blood sugar is less than 100.    If you are going to fast after midnight for a test or procedure, ask your physician for instructions on how to reduce/decrease your insulin  dose.    Call if blood sugar is less than 70 or consistently above 250  -Treating a low sugar by rule of 15  (15 gms of sugar every 15 min until sugar is more than 70) If you feel your sugar is low, test your sugar to be sure If your sugar is low (less than 70), then take 15 grams of a fast acting Carbohydrate (3-4 glucose tablets or glucose gel or 4 ounces of juice or regular soda) Recheck your sugar 15 min after treating low to make sure it is more than 70 If sugar is still less than 70, treat again with 15 grams of carbohydrate          Don't drive the hour of hypoglycemia  If unconscious/unable to eat or drink by mouth, use glucagon injection or nasal spray baqsimi and call 911. Can repeat again in 15 min if still unconscious.  Return in about 6 weeks (around 04/05/2024).   I have reviewed current medications, nurse's notes, allergies, vital signs, past medical and surgical history, family medical history, and social history for this encounter. Counseled patient on symptoms, examination findings, lab findings, imaging results, treatment decisions and monitoring and prognosis. The patient understood the recommendations and agrees with the treatment plan. All questions regarding treatment plan were fully answered.  Jorge Newcomer, MD  02/23/24  History of Present Illness Amber Rose is a 48 y.o. year old female who presents for follow up of Type II diabetes mellitus.  Amber Rose was first diagnosed in around 2015.   Diabetes education +  Works at night 4 pm -7  am Sleeps [place]-5am, 8:30am-11:30am  8am is BF, lunch is around noon, dinner at 5:30pm and 9 pm is snack   Home diabetes regimen: Lantus  18 units qpm Metformin  500mg  2 tabs bid Ozempic  1 mg per week  Humalog  scale: Use it 15 min before you eat: Space it out by 4 hours.  151 - 175: 1 unit 176 - 200: 2 units 201 - 225: 3 units 226 - 250: 4 units 251 - 275: 5 units 276 - 300: 6 units 301 - 325: 7 units 326 - 350: 8 units 351 - 375: 9 units 376 - 400: 10 units   Stopped ozempic  2mg  3 mo ago due to weight loss-lost 30-40 lbs   COMPLICATIONS -  MI/Stroke -  retinopathy -  neuropathy -  nephropathy   BLOOD SUGAR DATA  CGM interpretation: At today's visit, we reviewed her CGM downloads. The full report is scanned in the media. Reviewing the CGM trends, BG are low periodically across the day.  Physical Exam  There were no vitals taken for this visit.   Constitutional: well developed, well nourished Head: normocephalic, atraumatic Eyes: sclera anicteric,  no redness Neck: supple Lungs: normal respiratory effort Neurology: alert and oriented Skin: dry, no appreciable rashes Musculoskeletal: no appreciable defects Psychiatric: normal mood and affect Diabetic Foot Exam - Simple   No data filed      Current Medications Patient's Medications  New Prescriptions   No medications on file  Previous Medications   ALBUTEROL  (PROAIR  HFA) 108 (90 BASE) MCG/ACT INHALER    Inhale 2 puffs into the lungs every 4 (four) hours as needed for wheezing or shortness of breath.   ASPIRIN  EC 81 MG TABLET    Take 1 tablet (81 mg total) by mouth daily. Swallow whole.   BLOOD GLUCOSE MONITORING SUPPL (ACCU-CHEK AVIVA PLUS) W/DEVICE KIT    USE AS DIRECTED DAILY. E11.9   BUPROPION  (WELLBUTRIN  XL) 150 MG 24 HR TABLET    Take 1 tablet (150 mg total) by mouth daily.   CONTINUOUS BLOOD GLUC RECEIVER (FREESTYLE LIBRE 2 READER) DEVI    Use to check blood sugar three times daily. E11.49   CONTINUOUS  GLUCOSE SENSOR (FREESTYLE LIBRE 2 SENSOR) MISC    USE TO CHECK BLOOD SUGAR 3 TIMES A DAY - CHANGE SENSOR EVERY 14 DAYS   GLUCOSE BLOOD (FREESTYLE TEST STRIPS) TEST STRIP    Use to check blood sugar three times daily. E11.49   HYDROXYZINE  (ATARAX ) 50 MG TABLET    Take 1 tablet (50 mg total) by mouth 3 (three) times daily as needed.   INDAPAMIDE  (LOZOL ) 2.5 MG TABLET    Take 1 tablet (2.5 mg total) by mouth daily.   INSULIN  GLARGINE (LANTUS  SOLOSTAR) 100 UNIT/ML SOLOSTAR PEN    Inject 25 Units into the skin daily. Increase by 2 units to a maximum daily dose of 30 units every 4th day until blood sugars are at goal   INSULIN  LISPRO (HUMALOG  KWIKPEN) 100 UNIT/ML KWIKPEN    Inject 1-15 Units into the skin 3 (three) times daily.   INSULIN  PEN NEEDLE 31G X 5 MM MISC    1 each by Does not apply route at bedtime.   INSULIN  PEN NEEDLE 32G X 4 MM MISC    1 Needle by Does not apply route 3 (three) times daily.   LANCET DEVICES (ACCU-CHEK SOFTCLIX) LANCETS    Use as instructed daily.   LANCETS (FREESTYLE) LANCETS    Use to check blood sugar three times daily. E11.49   LUPRON DEPOT, 50-MONTH, 11.25 MG INJECTION    Inject 11.25 mg into the muscle every 3 (three) months.   METFORMIN  (GLUCOPHAGE ) 500 MG TABLET    Take 2 tablets (1,000 mg total) by mouth 2 (two) times daily with a meal.   METHOCARBAMOL  (ROBAXIN ) 750 MG TABLET    Take 1 tablet (750 mg total) by mouth every 8 (eight) hours as needed for muscle spasms.   NICOTINE  (NICODERM CQ  - DOSED IN MG/24 HOURS) 21 MG/24HR PATCH    Place 1 patch (21 mg total) onto the skin daily.   NORETHINDRONE  (AYGESTIN ) 5 MG TABLET    TAKE 1 TABLET BY MOUTH THREE TIMES A DAY   OMEPRAZOLE  (PRILOSEC) 40 MG CAPSULE    TAKE 1 CAPSULE BY MOUTH EVERY  MORNING   PREGABALIN  (LYRICA ) 100 MG CAPSULE    Take 1 capsule (100 mg total) by mouth 2 (two) times daily.   SACCHAROMYCES BOULARDII (FLORASTOR) 250 MG CAPSULE    Take 1 capsule (250 mg total) by mouth 2 (two) times daily.   SEMAGLUTIDE ,  1 MG/DOSE, 4 MG/3ML SOPN    Inject  1 mg as directed once a week.   SPIRONOLACTONE  (ALDACTONE ) 50 MG TABLET    Take 1 tablet (50 mg total) by mouth daily.   VALACYCLOVIR  (VALTREX ) 500 MG TABLET    Take 1 tablet (500 mg total) by mouth 2 (two) times daily. For herpes prophylaxis  Modified Medications   No medications on file  Discontinued Medications   No medications on file    Allergies Allergies  Allergen Reactions   Amlodipine      Leg pain, swelling around eyes    Atorvastatin  Other (See Comments)     Joint pain   Crestor  [Rosuvastatin ]     Joint pain    Irbesartan      Leg pain, swelling around eyes   Metronidazole  Nausea And Vomiting   Penicillins     childhood   Xanax [Alprazolam]     "Caused upper respiratory symptoms" per pt   Glyburide Other (See Comments)    "blood sugar dropped uncontrollably"   Linagliptin Diarrhea and Other (See Comments)    Stomach pain, sinus infection   Moxifloxacin Diarrhea    Past Medical History Past Medical History:  Diagnosis Date   Abnormal Pap smear of cervix    yrs ago   Adenomyosis    Allergy     Anemia    Anxiety    Aortic atherosclerosis (HCC)    Arthritis    Asthma    Chronic UTI    Clostridium difficile infection    Depression    Diabetes mellitus without complication (HCC) 10/22/2023   A1C 8.8   Diverticulosis    Endometriosis    Fibroid    Fibromyalgia    GERD (gastroesophageal reflux disease)    Hiatal hernia    Hyperlipidemia    Hypertension    Internal hemorrhoids    Meningitis, viral    Migraines    Pancreatitis    Pure hypercholesterolemia 12/23/2020   Tubular adenoma of colon     Past Surgical History Past Surgical History:  Procedure Laterality Date   APPENDECTOMY  1990   CERVICAL BIOPSY  W/ LOOP ELECTRODE EXCISION     LEEP   CESAREAN SECTION  2002   CHOLECYSTECTOMY  2011   COLONOSCOPY     UPPER GASTROINTESTINAL ENDOSCOPY     URINARY SURGERY     urethra sling, removal, and then revision  2016 (4 surgeries)   WISDOM TOOTH EXTRACTION      Family History family history includes Cancer in her mother; Diabetes in her father; Heart attack in her maternal grandfather; Hypertension in her mother; Multiple sclerosis in her paternal grandmother.  Social History Social History   Socioeconomic History   Marital status: Single    Spouse name: Not on file   Number of children: 3   Years of education: Not on file   Highest education level: Not on file  Occupational History   Not on file  Tobacco Use   Smoking status: Former    Current packs/day: 0.10    Average packs/day: 0.1 packs/day for 20.0 years (2.0 ttl pk-yrs)    Types: Cigarettes   Smokeless tobacco: Never   Tobacco comments:    Patient is engaged in health coaching for smoking cessation as of 12/26/20. Patient stated that she has stopped smoking, drinking and any form of drug usage since her stroke.  Vaping Use   Vaping status: Never Used  Substance and Sexual Activity   Alcohol use: Not Currently    Comment: occ   Drug use: Not Currently  Types: Marijuana    Comment: occ   Sexual activity: Yes    Partners: Male    Birth control/protection: Surgical    Comment: BTL  Other Topics Concern   Not on file  Social History Narrative   Lives home with adult niece.  Not working.  Disability pending.  Pt is single.  Education GED.  3 children.    Social Drivers of Corporate investment banker Strain: Low Risk  (01/25/2024)   Overall Financial Resource Strain (CARDIA)    Difficulty of Paying Living Expenses: Not very hard  Food Insecurity: Food Insecurity Present (01/25/2024)   Hunger Vital Sign    Worried About Running Out of Food in the Last Year: Sometimes true    Ran Out of Food in the Last Year: Sometimes true  Transportation Needs: No Transportation Needs (01/25/2024)   PRAPARE - Administrator, Civil Service (Medical): No    Lack of Transportation (Non-Medical): No  Physical Activity: Insufficiently  Active (01/25/2024)   Exercise Vital Sign    Days of Exercise per Week: 3 days    Minutes of Exercise per Session: 30 min  Stress: Stress Concern Present (01/25/2024)   [employer] of Occupational Health - Occupational Stress Questionnaire    Feeling of Stress : Rather much  Social Connections: Socially Isolated (01/25/2024)   Social Connection and Isolation Panel [NHANES]    Frequency of Communication with Friends and Family: Three times a week    Frequency of Social Gatherings with Friends and Family: Once a week    Attends Religious Services: Never    Database administrator or Organizations: No    Attends Banker Meetings: Never    Marital Status: Never married  Intimate Partner Violence: Not At Risk (01/25/2024)   Humiliation, Afraid, Rape, and Kick questionnaire    Fear of Current or Ex-Partner: No    Emotionally Abused: No    Physically Abused: No    Sexually Abused: No    Lab Results  Component Value Date   HGBA1C 6.4 (H) 01/08/2024   HGBA1C 7.7 (H) 11/22/2023   HGBA1C 8.8 (H) 10/22/2023   Lab Results  Component Value Date   CHOL 231 (H) 01/08/2024   Lab Results  Component Value Date   HDL 23 (L) 01/08/2024   Lab Results  Component Value Date   LDLCALC 175 (H) 01/08/2024   Lab Results  Component Value Date   TRIG 165 (H) 01/08/2024   Lab Results  Component Value Date   CHOLHDL 10.0 01/08/2024   Lab Results  Component Value Date   CREATININE 0.94 02/08/2024   Lab Results  Component Value Date   GFR 106.09 02/20/2022   Lab Results  Component Value Date   MICROALBUR 1.3 11/22/2023      Component Value Date/Time   NA 137 02/08/2024 1610   K 4.6 02/08/2024 1610   CL 100 02/08/2024 1610   CO2 23 02/08/2024 1610   GLUCOSE 97 02/08/2024 1610   GLUCOSE 120 (H) 02/08/2024 1109   BUN 18 02/08/2024 1610   CREATININE 0.94 02/08/2024 1610   CREATININE 0.97 02/08/2024 1109   CREATININE 0.70 11/22/2023 0950   CALCIUM  9.9 02/08/2024 1610    PROT 7.7 02/08/2024 1109   PROT 6.6 01/20/2024 1630   ALBUMIN 4.3 02/08/2024 1109   ALBUMIN 4.1 01/20/2024 1630   AST 12 (L) 02/08/2024 1109   ALT 13 02/08/2024 1109   ALKPHOS 52 02/08/2024 1109   BILITOT 0.4  02/08/2024 1109   GFRNONAA >60 02/08/2024 1109   GFRNONAA >89 10/28/2016 0851   GFRAA 127 12/12/2020 1149   GFRAA >89 10/28/2016 0851      Latest Ref Rng & Units 02/08/2024    4:10 PM 02/08/2024   11:09 AM 01/20/2024    4:30 PM  BMP  Glucose 70 - 99 mg/dL 97  161  096   BUN 6 - 24 mg/dL 18  18  19    Creatinine 0.57 - 1.00 mg/dL 0.45  4.09  8.11   BUN/Creat Ratio 9 - 23 19   20    Sodium 134 - 144 mmol/L 137  136  138   Potassium 3.5 - 5.2 mmol/L 4.6  4.6  4.6   Chloride 96 - 106 mmol/L 100  102  103   CO2 20 - 29 mmol/L 23  29  20    Calcium  8.7 - 10.2 mg/dL 9.9  9.7  9.2        Component Value Date/Time   WBC 10.6 (H) 02/08/2024 1109   WBC 11.4 (H) 01/09/2024 0530   RBC 4.88 02/08/2024 1109   HGB 15.2 (H) 02/08/2024 1109   HGB 15.0 01/20/2024 1630   HCT 43.6 02/08/2024 1109   HCT 44.9 01/20/2024 1630   PLT 316 02/08/2024 1109   PLT 330 01/20/2024 1630   MCV 89.3 02/08/2024 1109   MCV 95 01/20/2024 1630   MCH 31.1 02/08/2024 1109   MCHC 34.9 02/08/2024 1109   RDW 13.7 02/08/2024 1109   RDW 12.7 01/20/2024 1630   LYMPHSABS 2.5 02/08/2024 1109   LYMPHSABS 2.5 01/20/2024 1630   MONOABS 0.6 02/08/2024 1109   EOSABS 0.1 02/08/2024 1109   EOSABS 0.1 01/20/2024 1630   BASOSABS 0.1 02/08/2024 1109   BASOSABS 0.1 01/20/2024 1630     Parts of this note may have been dictated using voice recognition software. There may be variances in spelling and vocabulary which are unintentional. Not all errors are proofread. Please notify the [facility] if any discrepancies are noted or if the meaning of any statement is not clear.

## 2024-02-23 NOTE — Telephone Encounter (Signed)
 Pt was added to schedule.

## 2024-02-23 NOTE — Therapy (Signed)
 OUTPATIENT PHYSICAL THERAPY NEURO TREATMENT   Patient Name: Amber Rose MRN: [id number] DOB:1976-08-26, 48 y.o., female Today's Date: 02/23/2024   PCP: Joaquin Mulberry, MD REFERRING PROVIDER: Hassie Lint, PA-C  END OF SESSION:  PT End of Session - 02/23/24 1111     Visit Number 5    Number of Visits 9   8 + eval   Date for PT Re-Evaluation 03/31/24   pushed out due to future multi-disciplinary needs   Authorization Type [employer] dual complete    PT Start Time 1103    PT Stop Time 1146    PT Time Calculation (min) 43 min    Equipment Utilized During Treatment Gait belt    Activity Tolerance Treatment limited secondary to medical complications (Comment);Patient limited by pain   HTN   Behavior During Therapy [facility] for tasks assessed/performed             Past Medical History:  Diagnosis Date   Abnormal Pap smear of cervix    yrs ago   Adenomyosis    Allergy     Anemia    Anxiety    Aortic atherosclerosis (HCC)    Arthritis    Asthma    Chronic UTI    Clostridium difficile infection    Depression    Diabetes mellitus without complication (HCC) 10/22/2023   A1C 8.8   Diverticulosis    Endometriosis    Fibroid    Fibromyalgia    GERD (gastroesophageal reflux disease)    Hiatal hernia    Hyperlipidemia    Hypertension    Internal hemorrhoids    Meningitis, viral    Migraines    Pancreatitis    Pure hypercholesterolemia 12/23/2020   Tubular adenoma of colon    Past Surgical History:  Procedure Laterality Date   APPENDECTOMY  1990   CERVICAL BIOPSY  W/ LOOP ELECTRODE EXCISION     LEEP   CESAREAN SECTION  2002   CHOLECYSTECTOMY  2011   COLONOSCOPY     UPPER GASTROINTESTINAL ENDOSCOPY     URINARY SURGERY     urethra sling, removal, and then revision 2016 (4 surgeries)   WISDOM TOOTH EXTRACTION     Patient Active Problem List   Diagnosis Date Noted   CVA (cerebral vascular accident) (HCC) 01/08/2024   Leukocytosis 01/08/2024   Type 2 diabetes  mellitus with chronic kidney disease, with long-term current use of insulin  (HCC) 01/08/2024   Hyperlipidemia 01/08/2024   GERD (gastroesophageal reflux disease) 01/08/2024   Overweight (BMI 25.0-29.9) 01/08/2024   Moderately severe major depression (HCC) 01/06/2024   Yeast vaginitis 08/30/2023   Iron  deficiency anemia 03/11/2023   PMDD (premenstrual dysphoric disorder) 11/17/2022   Abnormal uterine bleeding (AUB) 11/17/2022   Statin myopathy 09/23/2022   Steatorrhea 08/17/2022   Irregular menses 11/28/2021   Perimenopause 11/28/2021   Episode of recurrent major depressive disorder (HCC) 04/09/2021   BMI 33.0-33.9,adult 04/09/2021   Pure hypercholesterolemia 12/23/2020   Menorrhagia with regular cycle 09/13/2020   Well woman exam 09/13/2020   History of herpes zoster 01/17/2018   Overactive bladder 12/28/2017   Recurrent infections 11/19/2017   Cyst of nasal cavity 11/19/2017   Angio-edema 11/19/2017   Abdominal pain, epigastric 11/04/2017   Dysphagia 11/04/2017   Gastroesophageal reflux disease 07/12/2017   Degenerative disc disease at L5-S1 level 05/14/2017   Fibromyalgia 05/14/2017   ADD (attention deficit disorder) 05/14/2017   Urinary incontinence 04/05/2017   Thigh pain 02/02/2017   Anxiety and depression 02/02/2017   Tobacco abuse  02/02/2017   Facial flushing 02/02/2017   Hypertension 10/23/2016   Hidradenitis 10/23/2016   Diabetes (HCC) 09/12/2014   Lupus anticoagulant positive 07/03/2014   Vitamin D  deficiency 06/06/2014    ONSET DATE: 01/07/2024 (Lacunar CVA)  REFERRING DIAG: G81.91 (ICD-10-CM) - Right hemiparesis (HCC) I63.9 (ICD-10-CM) - Cerebrovascular accident (CVA), unspecified mechanism (HCC)  THERAPY DIAG:  Other abnormalities of gait and mobility  Muscle weakness (generalized)  Unsteadiness on feet  Rationale for Evaluation and Treatment: Rehabilitation  SUBJECTIVE:                                                                                                                                                                                              SUBJECTIVE STATEMENT: Pt reports she is mentally tired from ST evaluation as "she worked my brain out".  She denies falls, but states the right side is spasming more.  Her blood sugar has been dropping into 50-70s range and she has felt drained and not being able to cook her meals has been stressing her out as she has been trying to manage her HTN and blood sugar with her diet. Pt accompanied by: self (family dropped her off)  PERTINENT HISTORY: HTN, anxiety, depression, fibromyalgia, herpes zoster, DM2, statin intolerance, thalamic CVA, CKD  PAIN:  Are you having pain? Yes: NPRS scale: 8 Pain location: right leg is more noticeable, right arm just feels "dead" but this is unchanged Pain description: cramping/stiffness/numbness in right hemibody Aggravating factors: using the arm, laying on the right Relieving factors: sitting still  PRECAUTIONS: Fall  RED FLAGS: Bowel or bladder incontinence: Yes: urinary incontinence - wearing depends today and MD aware per report    WEIGHT BEARING RESTRICTIONS: No  FALLS: Has patient fallen in last 6 months? No  LIVING ENVIRONMENT: Lives with: lives with an adult companion Lives in: House/apartment Stairs: Yes: External: 3 steps; none Has following equipment at home: Grab bars  PLOF: Independent with gait, Independent with transfers, Needs assistance with ADLs, and Needs assistance with homemaking  PATIENT GOALS: "Everything is really up top so I don't really know about PT."  OBJECTIVE:  Note: Objective measures were completed at Evaluation unless otherwise noted.  DIAGNOSTIC FINDINGS:  MR Brain 01/08/2024: IMPRESSION: Acute lacunar infarct in the left thalamus.  COGNITION: Overall cognitive status:  pt reports some difficulty with memory/losing her place with tasks due to focus   SENSATION: Light touch: WFL - pt reports decreased R  > L  COORDINATION: LE RAMS;  impaired Heel-to-shin:  WNL bilaterally  EDEMA:  WNL  MUSCLE TONE: Difficulty discerning flexion spasticity due to pt difficulty relaxing  POSTURE:  rounded shoulders and forward head  LOWER EXTREMITY ROM:     Active  Right Eval Left Eval  Hip flexion WNL WNL  Hip extension    Hip abduction    Hip adduction    Hip internal rotation    Hip external rotation    Knee flexion " "  Knee extension " "  Ankle dorsiflexion " "  Ankle plantarflexion    Ankle inversion    Ankle eversion     (Blank rows = not tested)  LOWER EXTREMITY MMT:    MMT Right Eval Left Eval  Hip flexion 4-/5 4+/5  Hip extension    Hip abduction    Hip adduction    Hip internal rotation    Hip external rotation    Knee flexion 4-/5 4+/5  Knee extension 4/5 5/5  Ankle dorsiflexion 4+/5 5/5  Ankle plantarflexion    Ankle inversion    Ankle eversion    (Blank rows = not tested)  BED MOBILITY:  Pt sleeps in standard bed and requires increased time and momentum to get roll and has difficulty managing bedding  TRANSFERS: Assistive device utilized: None  Sit to stand: SBA Stand to sit: SBA Chair to chair: SBA  GAIT: Gait pattern:  bilateral ER, step through pattern, decreased stride length, decreased hip/knee flexion- Right, decreased hip/knee flexion- Left, decreased ankle dorsiflexion- Right, decreased ankle dorsiflexion- Left, trendelenburg, and wide BOS Distance walked: various clinic distances Assistive device utilized: None Level of assistance: SBA Comments: Pt has severe drift from pathway to left then right when entering clinic, increased time to correct path with cuing.  Truncal ataxia?  FUNCTIONAL TESTS:  5 times sit to stand: 15.13 sec w/ light BUE support, severe hyperextension in standing, mild weight shift left during sitting Timed up and go (TUG): 11.28 sec SBA, no instability in turning with small radius 10 meter walk test: 8.50 sec = 1.18 m/sec  OR 3.88 ft/sec Functional gait assessment: To be assessed.  PATIENT SURVEYS:  None completed due to time.                                                                                                                              TREATMENT DATE: 02/23/2024  LUE IN SITTING PRIOR TO INTERVENTIONS: Vitals:   02/23/24 1107 02/23/24 1116  BP: (!) 137/110 (!) 129/109  Pulse: 91 94   -PT provided edu on safe limits for exercise, possible contributors to continued HTN, timeline for therapeutic effects of medication and diet adjustments, encouraged pt to continue her efforts with exercise and diet management at home due to long-term benefits, continue dialogue with doctors regarding medication concerns (for cholesterol management primarily) and possible alternative options.  Provided therapeutic listening and encouragement due to pt frustration and fear of recurrent stroke.   -Discussed stretching this session w/ diaphragmatic breathing to assist in stress and pain management that may be contributing to HTN, pt agreeable.  -Supine calf stretch w/  strap x30 sec each side > hamstring stretch w/ strap x30 sec each side > SKTC x1 minute each side > hook-lying piriformis stretch x45 sec each side (discussed decreased depth of ROM due to some rebound spasm in R hip) > LTRs x20 > IR/ER stretch x20 -Sitting forward arm circles x 10, cued to slow pace and modify left UE for better shoulder engagement at 90 deg abduction > backwards arm circles x10 > upper trap stretch x1 minute each side  PATIENT EDUCATION: Education details: BP monitoring at home/logging if able.  See above for further.  Continue HEP if BP allows. Person educated: Patient Education method: Explanation Education comprehension: verbalized understanding and needs further education  HOME EXERCISE PROGRAM: Access Code: [email or web address] URL: [email or web address] Date: 02/04/2024 Prepared by: Marilou Showman  Exercises - Sit to  Stand with Armchair  - 1 x daily - 7 x weekly - 3 sets - 10 reps - Corner Balance Feet Together With Eyes Open  - 1 x daily - 5 x weekly - 1 sets - 3-4 reps - 30 seconds hold - Corner Balance Feet Together With Eyes Closed  - 1 x daily - 5 x weekly - 1 sets - 3-4 reps - 30 seconds hold - Corner Balance Feet Together: Eyes Open With Head Turns  - 1 x daily - 5 x weekly - 1 sets - 3-4 reps - 30 seconds hold - Romberg Stance with Head Nods  - 1 x daily - 5 x weekly - 2-3 sets - 10 reps - Tandem Walking with Counter Support  - 1 x daily - 5 x weekly - 3 sets - 10 reps  GOALS: Goals reviewed with patient? Yes  SHORT TERM GOALS: Target date: 02/25/2024  Pt will be independent and compliant with initial strength and balance focused HEP in order to maintain functional progress and improve mobility. Baseline:  To be established. Goal status: INITIAL  2.  Pt will decrease 5xSTS to </=12 seconds w/ improved upright neutral in order to demonstrate decreased risk for falls and improved functional bilateral LE strength and power. Baseline: 15.13 sec w/ light BUE support and severe lumbar hyperextension on upright Goal status: INITIAL  LONG TERM GOALS: Target date: 03/24/2024  Pt will be independent and compliant with advanced and finalized strength and balance focused HEP in order to maintain functional progress and improve mobility. Baseline: To be established. Goal status: INITIAL  2.  Pt will demonstrate a gait speed of >/=4.08 feet/sec in order to decrease risk for falls. Baseline: 3.88 ft/sec Goal status: INITIAL  3.  Pt will ambulate >/=500 feet with LRAD at no more than mod I level of assist over unlevel/compliant surfaces, curb step and ramp with improved trunk stability to promote household and community access. Baseline: instability over level ground/SBA Goal status: INITIAL  4.  Pt will improve FGA score to >/=19/30 in order to demonstrate improved balance and decreased fall  risk. Baseline: 13/30 (4/11) Goal status: INITIAL  ASSESSMENT:  CLINICAL IMPRESSION: Session limited by ongoing HTN and right-sided pain.  Focused initial half of session on therapeutic listening and encouragement to continue blood pressure, glucose, and cholesterol management as pt is frustrated with current issues despite her efforts.  She tolerates stretching regimen this visit well without increased symptoms.  Pain was relatively unchanged, but PT will continue to incorporate stretching program into sessions to manage spasms and spasticity as needed.  Continue per POC.  OBJECTIVE IMPAIRMENTS: Abnormal gait, decreased activity tolerance, decreased balance, decreased coordination,  decreased knowledge of condition, decreased knowledge of use of DME, decreased strength, increased muscle spasms, impaired sensation, improper body mechanics, postural dysfunction, and pain.   ACTIVITY LIMITATIONS: carrying, lifting, bending, standing, squatting, stairs, transfers, bed mobility, continence, bathing, dressing, self feeding, reach over head, and locomotion level  PARTICIPATION LIMITATIONS: meal prep, cleaning, laundry, driving, community activity, and occupation  PERSONAL FACTORS: Fitness, Past/current experiences, Transportation, and 3+ comorbidities: HTN, fibromyalgia, DM2  are also affecting patient's functional outcome.   REHAB POTENTIAL: Good  CLINICAL DECISION MAKING: Evolving/moderate complexity  EVALUATION COMPLEXITY: Moderate  PLAN:  PT FREQUENCY: 1x/week  PT DURATION: 8 weeks  PLANNED INTERVENTIONS: 97164- PT Re-evaluation, 97110-Therapeutic exercises, 97530- Therapeutic activity, 97112- Neuromuscular re-education, 97535- Self Care, 16109- Manual therapy, [phone]- Gait training, [phone]- Orthotic Fit/training, [phone]- Aquatic Therapy, [phone]- Electrical stimulation (manual), Patient/Family education, Balance training, Stair training, Taping, Dry Needling, Joint mobilization, Spinal  mobilization, Vestibular training, DME instructions, Cryotherapy, and Moist heat  PLAN FOR NEXT SESSION: How is HEP for strength and balance?  Bed mobility for improved efficiency.  Gait training.  weighted vest w/ hurdles/dynamic stability tasks.  RLE NMR.  Blaze pods.  ASSESS STGs!   Earlean Glaze, PT, DPT 02/23/2024, 12:25 PM

## 2024-02-24 ENCOUNTER — Encounter: Payer: Self-pay | Admitting: Obstetrics and Gynecology

## 2024-02-24 ENCOUNTER — Encounter: Payer: Self-pay | Admitting: Adult Health

## 2024-02-24 NOTE — Telephone Encounter (Signed)
 Patient has an open MyChart encounter dated 12/30/23 and open surgery referral, pending medical clearance.

## 2024-02-28 ENCOUNTER — Encounter: Payer: Self-pay | Admitting: Family Medicine

## 2024-02-29 MED ORDER — BACLOFEN 10 MG PO TABS: 10.0000 mg | ORAL_TABLET | Freq: Three times a day (TID) | ORAL | 11 refills | Status: AC | PRN

## 2024-02-29 NOTE — Therapy (Signed)
 OUTPATIENT OCCUPATIONAL THERAPY NEURO EVALUATION  Patient Name: Amber Rose MRN: [id number] DOB:04/16/1976, 48 y.o., female Today's Date: 03/01/2024  PCP: Joaquin Mulberry, MD  REFERRING PROVIDER: Johny Nap, NP  END OF SESSION:  OT End of Session - 03/01/24 1025     Visit Number 1    Number of Visits 17    Date for OT Re-Evaluation 05/05/24    Authorization Type [employer] Medicare primary; [place] Medicaid secondary    OT Start Time 1024    OT Stop Time 1102    OT Time Calculation (min) 38 min    Activity Tolerance Patient tolerated treatment well    Behavior During Therapy WFL for tasks assessed/performed             Past Medical History:  Diagnosis Date   Abnormal Pap smear of cervix    yrs ago   Adenomyosis    Allergy     Anemia    Anxiety    Aortic atherosclerosis (HCC)    Arthritis    Asthma    Chronic UTI    Clostridium difficile infection    Depression    Diabetes mellitus without complication (HCC) 10/22/2023   A1C 8.8   Diverticulosis    Endometriosis    Fibroid    Fibromyalgia    GERD (gastroesophageal reflux disease)    Hiatal hernia    Hyperlipidemia    Hypertension    Internal hemorrhoids    Meningitis, viral    Migraines    Pancreatitis    Pure hypercholesterolemia 12/23/2020   Tubular adenoma of colon    Past Surgical History:  Procedure Laterality Date   APPENDECTOMY  1990   CERVICAL BIOPSY  W/ LOOP ELECTRODE EXCISION     LEEP   CESAREAN SECTION  2002   CHOLECYSTECTOMY  2011   COLONOSCOPY     UPPER GASTROINTESTINAL ENDOSCOPY     URINARY SURGERY     urethra sling, removal, and then revision 2016 (4 surgeries)   WISDOM TOOTH EXTRACTION     Patient Active Problem List   Diagnosis Date Noted   CVA (cerebral vascular accident) (HCC) 01/08/2024   Leukocytosis 01/08/2024   Type 2 diabetes mellitus with chronic kidney disease, with long-term current use of insulin  (HCC) 01/08/2024   Hyperlipidemia 01/08/2024   GERD (gastroesophageal  reflux disease) 01/08/2024   Overweight (BMI 25.0-29.9) 01/08/2024   Moderately severe major depression (HCC) 01/06/2024   Yeast vaginitis 08/30/2023   Iron  deficiency anemia 03/11/2023   PMDD (premenstrual dysphoric disorder) 11/17/2022   Abnormal uterine bleeding (AUB) 11/17/2022   Statin myopathy 09/23/2022   Steatorrhea 08/17/2022   Irregular menses 11/28/2021   Perimenopause 11/28/2021   Episode of recurrent major depressive disorder (HCC) 04/09/2021   BMI 33.0-33.9,adult 04/09/2021   Pure hypercholesterolemia 12/23/2020   Menorrhagia with regular cycle 09/13/2020   Well woman exam 09/13/2020   History of herpes zoster 01/17/2018   Overactive bladder 12/28/2017   Recurrent infections 11/19/2017   Cyst of nasal cavity 11/19/2017   Angio-edema 11/19/2017   Abdominal pain, epigastric 11/04/2017   Dysphagia 11/04/2017   Gastroesophageal reflux disease 07/12/2017   Degenerative disc disease at L5-S1 level 05/14/2017   Fibromyalgia 05/14/2017   ADD (attention deficit disorder) 05/14/2017   Urinary incontinence 04/05/2017   Thigh pain 02/02/2017   Anxiety and depression 02/02/2017   Tobacco abuse 02/02/2017   Facial flushing 02/02/2017   Hypertension 10/23/2016   Hidradenitis 10/23/2016   Diabetes (HCC) 09/12/2014   Lupus anticoagulant positive 07/03/2014   Vitamin  D deficiency 06/06/2014    ONSET DATE: 01/31/2024 (Date of referral)   REFERRING DIAG: I63.81 (ICD-10-CM) - Thalamic stroke (HCC) G81.91 (ICD-10-CM) - Right hemiparesis (HCC) R29.818 (ICD-10-CM) - Hemisensory deficit  THERAPY DIAG:  Muscle weakness (generalized)  Hemiplegia and hemiparesis following cerebral infarction affecting right dominant side (HCC)  Other lack of coordination  Other symptoms and signs involving the musculoskeletal system  Other symptoms and signs involving the nervous system  Other disturbances of skin sensation  Rationale for Evaluation and Treatment:  Rehabilitation  SUBJECTIVE:   SUBJECTIVE STATEMENT: She was having muscle atrophy/deconditioning prior to the stroke due to taking Statin drugs.   Pt accompanied by: self  PERTINENT HISTORY: PMH: HTN, anxiety, depression, fibromyalgia, herpes zoster, DM2, statin intolerance, thalamic CVA, CKD   PRECAUTIONS: Fall  WEIGHT BEARING RESTRICTIONS: No  PAIN:  Are you having pain? Yes: NPRS scale: 8-7/10 Pain location: R side of body (more severe from elbow to shoulder) Pain description: buzzing Aggravating factors: movement Relieving factors: rest  FALLS: Has patient fallen in last 6 months? 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  HAND DOMINANCE: Right  ADLs: Eating: setup to cut food Grooming: mod I  UB Dressing: min A to fasten bra LB Dressing: mod I  Toileting: mod I Bathing: mod I Tub Shower transfers: mod I Equipment: Grab bars and handheld shower head  IADLs: Shopping: dependent Light housekeeping: dependent Meal Prep: mod I for light meal prep; dependent for larger meals, which she would have cooked before her stroke Community mobility: dependent Medication management: mod I Financial management: mod I Handwriting: 25% legible and Increased time  MOBILITY STATUS: mod I  ACTIVITY TOLERANCE: Activity tolerance: fair  FUNCTIONAL OUTCOME MEASURES: PSFS: 2.7 total score  Total score = sum of the activity scores/number of activities Minimum detectable change (90%CI) for average score = 2 points Minimum detectable change (90%CI) for single activity score = 3 points  UPPER EXTREMITY ROM:    BUE: WFL though  bradykinesia with RUE and impaired opposition  UPPER EXTREMITY MMT:   Poor endurance with RUE testing  MMT Right (eval) Left (eval)  Shoulder flexion 3/5 WNL  Shoulder abduction 3/5 WNL  Elbow flexion 4-/5 WNL  Elbow extension 3/5 WNL  (Blank rows = not tested)  HAND FUNCTION: Grip strength: Right: 25.3 lbs; Left: 49.6 lbs  COORDINATION: 9 Hole Peg test: Right: 37 sec; Left: 24 sec  SENSATION: Light touch: Impaired   EDEMA: none reported or observed  MUSCLE TONE: WFL  COGNITION: Overall cognitive status: Within functional limits for tasks assessed  VISION: Subjective report: no changes  VISION ASSESSMENT: WFL  PERCEPTION: Not tested  PRAXIS: Not tested  OBSERVATIONS: Pt appears well-kept.  TREATMENT :    OT educated pt on CVA rehabilitation process   PATIENT EDUCATION: Education details: OT Role and POC Person educated: Patient Education method: Explanation Education comprehension: verbalized understanding  HOME EXERCISE PROGRAM: N/A for this visit  GOALS:  SHORT TERM GOALS: Target date: 03/31/2024    Patient will demonstrate initial R UE HEP with 25% verbal cues or less for proper execution. Baseline: Goal status: INITIAL  2.  Pt will independently recall the 5 main sensory precautions (cold, heat, sharp, chemical, and heavy) as needed to prevent injury/harm secondary to impairments.   Baseline:  Goal status: INITIAL   LONG TERM GOALS: Target date: 05/05/2024  Patient will demonstrate updated R UE HEP with visual handouts only for proper execution. Baseline:  Goal status: INITIAL  2.  Patient will demonstrate at least 35 lbs R grip strength as needed to open jars and other containers. Baseline: Right: 25.3 lbs Goal status: INITIAL  3.  Patient will demo improved FM coordination as evidenced by completing nine-hole peg  with use of R in 30 seconds or less.  Baseline:  Right: 37 sec Goal status: INITIAL  4.  Patient will report at least two-point increase in average PSFS score or at least three-point increase in a single activity score indicating functionally significant improvement given minimum detectable change.  Baseline: 2.7 total score (See above for individual activity scores) Goal status: INITIAL  ASSESSMENT:  CLINICAL IMPRESSION: Patient is a 48 y.o. female who was seen today for occupational therapy evaluation for CVA. Hx includes HTN, anxiety, depression, fibromyalgia, herpes zoster, DM2, statin intolerance, thalamic CVA, and CKD. Patient currently presents below baseline level of functioning demonstrating functional deficits and impairments as noted below. Pt would benefit from skilled OT services in the outpatient setting to work on impairments as noted below to help pt return to PLOF as able.    PERFORMANCE DEFICITS: in functional skills including ADLs, IADLs, coordination, sensation, strength, pain, Fine motor control, Gross motor control, and UE functional use.   IMPAIRMENTS: are limiting patient from ADLs, IADLs, rest and sleep, work, and social participation.   CO-MORBIDITIES: may have co-morbidities  that affects occupational performance. Patient will benefit from skilled OT to address above impairments and improve overall function.  MODIFICATION OR ASSISTANCE TO COMPLETE EVALUATION: Min-Moderate modification of tasks or assist with assess necessary to complete an evaluation.  OT OCCUPATIONAL PROFILE AND HISTORY: Detailed assessment: Review of records and additional review of physical, cognitive, psychosocial history related to current functional performance.  CLINICAL DECISION MAKING: Moderate - several treatment options, min-mod task modification necessary  REHAB POTENTIAL: Good  EVALUATION COMPLEXITY: Moderate    PLAN:  OT FREQUENCY: 2x/week  OT DURATION: 8 weeks  PLANNED  INTERVENTIONS: 97168 OT Re-evaluation, 97535 self care/ADL training, 16109 therapeutic exercise, 97530 therapeutic activity, 97112 neuromuscular re-education, 97140 manual therapy, 97035 ultrasound, 97018 paraffin, 60454 fluidotherapy, 97010 moist heat, 97032 electrical stimulation (manual), 97760 Orthotic Initial, 97763 Orthotic/Prosthetic subsequent, energy conservation, coping strategies training, patient/family education, and DME and/or AE instructions  RECOMMENDED OTHER SERVICES: N/A for this visit  CONSULTED AND AGREED WITH PLAN OF CARE: Patient  PLAN FOR NEXT SESSION: RUE coordination and red putty HEPs   Altamease Asters, OT 03/01/2024, 6:16 PM

## 2024-03-01 ENCOUNTER — Encounter: Payer: Self-pay | Admitting: Occupational Therapy

## 2024-03-01 ENCOUNTER — Encounter: Payer: Self-pay | Admitting: Physical Therapy

## 2024-03-01 ENCOUNTER — Ambulatory Visit: Admitting: Occupational Therapy

## 2024-03-01 ENCOUNTER — Encounter (HOSPITAL_BASED_OUTPATIENT_CLINIC_OR_DEPARTMENT_OTHER): Payer: Self-pay | Admitting: Cardiovascular Disease

## 2024-03-01 ENCOUNTER — Ambulatory Visit: Attending: Physician Assistant | Admitting: Physical Therapy

## 2024-03-01 VITALS — BP 157/105 | HR 75

## 2024-03-01 DIAGNOSIS — I69351 Hemiplegia and hemiparesis following cerebral infarction affecting right dominant side: Secondary | ICD-10-CM

## 2024-03-01 DIAGNOSIS — M6281 Muscle weakness (generalized): Secondary | ICD-10-CM | POA: Diagnosis not present

## 2024-03-01 DIAGNOSIS — R2689 Other abnormalities of gait and mobility: Secondary | ICD-10-CM | POA: Diagnosis not present

## 2024-03-01 DIAGNOSIS — R29818 Other symptoms and signs involving the nervous system: Secondary | ICD-10-CM

## 2024-03-01 DIAGNOSIS — R29898 Other symptoms and signs involving the musculoskeletal system: Secondary | ICD-10-CM

## 2024-03-01 DIAGNOSIS — I10 Essential (primary) hypertension: Secondary | ICD-10-CM

## 2024-03-01 DIAGNOSIS — R208 Other disturbances of skin sensation: Secondary | ICD-10-CM | POA: Insufficient documentation

## 2024-03-01 DIAGNOSIS — R278 Other lack of coordination: Secondary | ICD-10-CM

## 2024-03-01 DIAGNOSIS — R2681 Unsteadiness on feet: Secondary | ICD-10-CM | POA: Diagnosis not present

## 2024-03-01 NOTE — Therapy (Signed)
 OUTPATIENT PHYSICAL THERAPY NEURO TREATMENT   Patient Name: Amber Rose MRN: [id number] DOB:[birth year], 48 y.o., female Today's Date: 03/01/2024   PCP: Joaquin Mulberry, MD REFERRING PROVIDER: Hassie Lint, PA-C  END OF SESSION:  PT End of Session - 03/01/24 1107     Visit Number 6    Number of Visits 9   8 + eval   Date for PT Re-Evaluation 03/31/24   pushed out due to future multi-disciplinary needs   Authorization Type [employer] dual complete    PT Start Time 1102    PT Stop Time 1144    PT Time Calculation (min) 42 min    Equipment Utilized During Treatment Gait belt    Activity Tolerance Patient limited by pain;Other (comment)   HTN   Behavior During Therapy WFL for tasks assessed/performed             Past Medical History:  Diagnosis Date   Abnormal Pap smear of cervix    yrs ago   Adenomyosis    Allergy     Anemia    Anxiety    Aortic atherosclerosis (HCC)    Arthritis    Asthma    Chronic UTI    Clostridium difficile infection    Depression    Diabetes mellitus without complication (HCC) 10/22/2023   A1C 8.8   Diverticulosis    Endometriosis    Fibroid    Fibromyalgia    GERD (gastroesophageal reflux disease)    Hiatal hernia    Hyperlipidemia    Hypertension    Internal hemorrhoids    Meningitis, viral    Migraines    Pancreatitis    Pure hypercholesterolemia 12/23/2020   Tubular adenoma of colon    Past Surgical History:  Procedure Laterality Date   APPENDECTOMY  1990   CERVICAL BIOPSY  W/ LOOP ELECTRODE EXCISION     LEEP   CESAREAN SECTION  2002   CHOLECYSTECTOMY  2011   COLONOSCOPY     UPPER GASTROINTESTINAL ENDOSCOPY     URINARY SURGERY     urethra sling, removal, and then revision 2016 (4 surgeries)   WISDOM TOOTH EXTRACTION     Patient Active Problem List   Diagnosis Date Noted   CVA (cerebral vascular accident) (HCC) 01/08/2024   Leukocytosis 01/08/2024   Type 2 diabetes mellitus with chronic kidney disease, with  long-term current use of insulin  (HCC) 01/08/2024   Hyperlipidemia 01/08/2024   GERD (gastroesophageal reflux disease) 01/08/2024   Overweight (BMI 25.0-29.9) 01/08/2024   Moderately severe major depression (HCC) 01/06/2024   Yeast vaginitis 08/30/2023   Iron  deficiency anemia 03/11/2023   PMDD (premenstrual dysphoric disorder) 11/17/2022   Abnormal uterine bleeding (AUB) 11/17/2022   Statin myopathy 09/23/2022   Steatorrhea 08/17/2022   Irregular menses 11/28/2021   Perimenopause 11/28/2021   Episode of recurrent major depressive disorder (HCC) 04/09/2021   BMI 33.0-33.9,adult 04/09/2021   Pure hypercholesterolemia 12/23/2020   Menorrhagia with regular cycle 09/13/2020   Well woman exam 09/13/2020   History of herpes zoster 01/17/2018   Overactive bladder 12/28/2017   Recurrent infections 11/19/2017   Cyst of nasal cavity 11/19/2017   Angio-edema 11/19/2017   Abdominal pain, epigastric 11/04/2017   Dysphagia 11/04/2017   Gastroesophageal reflux disease 07/12/2017   Degenerative disc disease at L5-S1 level 05/14/2017   Fibromyalgia 05/14/2017   ADD (attention deficit disorder) 05/14/2017   Urinary incontinence 04/05/2017   Thigh pain 02/02/2017   Anxiety and depression 02/02/2017   Tobacco abuse 02/02/2017   Facial flushing  02/02/2017   Hypertension 10/23/2016   Hidradenitis 10/23/2016   Diabetes (HCC) 09/12/2014   Lupus anticoagulant positive 07/03/2014   Vitamin D  deficiency 06/06/2014    ONSET DATE: 01/07/2024 (Lacunar CVA)  REFERRING DIAG: G81.91 (ICD-10-CM) - Right hemiparesis (HCC) I63.9 (ICD-10-CM) - Cerebrovascular accident (CVA), unspecified mechanism (HCC)  THERAPY DIAG:  Other abnormalities of gait and mobility  Muscle weakness (generalized)  Unsteadiness on feet  Hemiplegia and hemiparesis following cerebral infarction affecting right dominant side (HCC)  Rationale for Evaluation and Treatment: Rehabilitation  SUBJECTIVE:                                                                                                                                                                                              SUBJECTIVE STATEMENT: Pt reports she is uncertain how scheduling for OT/ST in addition to PT will go due to frequencies.  She states she agreed to try baclofen and is picking up today. Pt accompanied by: self (family dropped her off)  PERTINENT HISTORY: HTN, anxiety, depression, fibromyalgia, herpes zoster, DM2, statin intolerance, thalamic CVA, CKD  PAIN:  Are you having pain? Yes: NPRS scale: 8 Pain location: right leg is more noticeable, right arm just feels "dead" but this is unchanged Pain description: cramping and spasms/stiffness/numbness in right hemibody Aggravating factors: using the arm, laying on the right Relieving factors: sitting still  PRECAUTIONS: Fall  RED FLAGS: Bowel or bladder incontinence: Yes: urinary incontinence - wearing depends today and MD aware per report    WEIGHT BEARING RESTRICTIONS: No  FALLS: Has patient fallen in last 6 months? No  LIVING ENVIRONMENT: Lives with: lives with an adult companion Lives in: House/apartment Stairs: Yes: External: 3 steps; none Has following equipment at home: Grab bars  PLOF: Independent with gait, Independent with transfers, Needs assistance with ADLs, and Needs assistance with homemaking  PATIENT GOALS: "Everything is really up top so I don't really know about PT."  OBJECTIVE:  Note: Objective measures were completed at Evaluation unless otherwise noted.  DIAGNOSTIC FINDINGS:  MR Brain 01/08/2024: IMPRESSION: Acute lacunar infarct in the left thalamus.  COGNITION: Overall cognitive status:  pt reports some difficulty with memory/losing her place with tasks due to focus   SENSATION: Light touch: WFL - pt reports decreased R > L  COORDINATION: LE RAMS;  impaired Heel-to-shin:  WNL bilaterally  EDEMA:  WNL  MUSCLE TONE: Difficulty discerning  flexion spasticity due to pt difficulty relaxing  POSTURE: rounded shoulders and forward head  LOWER EXTREMITY ROM:     Active  Right Eval Left Eval  Hip flexion WNL WNL  Hip extension  Hip abduction    Hip adduction    Hip internal rotation    Hip external rotation    Knee flexion " "  Knee extension " "  Ankle dorsiflexion " "  Ankle plantarflexion    Ankle inversion    Ankle eversion     (Blank rows = not tested)  LOWER EXTREMITY MMT:    MMT Right Eval Left Eval  Hip flexion 4-/5 4+/5  Hip extension    Hip abduction    Hip adduction    Hip internal rotation    Hip external rotation    Knee flexion 4-/5 4+/5  Knee extension 4/5 5/5  Ankle dorsiflexion 4+/5 5/5  Ankle plantarflexion    Ankle inversion    Ankle eversion    (Blank rows = not tested)  BED MOBILITY:  Pt sleeps in standard bed and requires increased time and momentum to get roll and has difficulty managing bedding  TRANSFERS: Assistive device utilized: None  Sit to stand: SBA Stand to sit: SBA Chair to chair: SBA  GAIT: Gait pattern:  bilateral ER, step through pattern, decreased stride length, decreased hip/knee flexion- Right, decreased hip/knee flexion- Left, decreased ankle dorsiflexion- Right, decreased ankle dorsiflexion- Left, trendelenburg, and wide BOS Distance walked: various clinic distances Assistive device utilized: None Level of assistance: SBA Comments: Pt has severe drift from pathway to left then right when entering clinic, increased time to correct path with cuing.  Truncal ataxia?  FUNCTIONAL TESTS:  5 times sit to stand: 15.13 sec w/ light BUE support, severe hyperextension in standing, mild weight shift left during sitting Timed up and go (TUG): 11.28 sec SBA, no instability in turning with small radius 10 meter walk test: 8.50 sec = 1.18 m/sec OR 3.88 ft/sec Functional gait assessment: To be assessed.  PATIENT SURVEYS:  None completed due to time.                                                                                                                               TREATMENT DATE: 03/01/2024  LUE IN SITTING PRIOR TO INTERVENTIONS: Vitals:   03/01/24 1111  BP: (!) 157/105  Pulse: 75   -Verbally reviewed HEP and pt in need of advancements.  No printout needed.  She is walking up and down her driveway 30 minutes a day as this is very flat. -5xSTS:  15.21 seconds w/o UE  -2 rounds of diaphragmatic breathing (x10 each) -Seated APT/PPT x20  Chair yoga: -Seated cat/cow x10, cued to pace w/ breathing -Twists x10 each direction -Side leans x10 each direction w/ 3 second holds, discussed slow stretching to not irritate spasticity -Assisted neck stretches 5x20 seconds each side -Ankle to knee w/ minimal trunk flexion 2x30 seconds each side -Goddess w/ a twist x10 each side, cued to improve fluidity, shoulder stability and rotation - pt has oblique cramping w/ right rotation -Forward fold x10 slowly w/ paced breathing  PATIENT EDUCATION: Education details: Reminder  to schedule ST w/ OT on way out.  BP monitoring at home/logging if able.  Please follow-up with MD about options for BP management as she is not open to returning to statin use for cholesterol management. Continue HEP if BP allows - reminder of modifying based on safe limits. Person educated: Patient Education method: Explanation Education comprehension: verbalized understanding and needs further education  HOME EXERCISE PROGRAM: Access Code: [email or web address] URL: [email or web address] Date: 02/04/2024 Prepared by: Marilou Showman  Exercises - Sit to Stand with Armchair  - 1 x daily - 7 x weekly - 3 sets - 10 reps - Corner Balance Feet Together With Eyes Open  - 1 x daily - 5 x weekly - 1 sets - 3-4 reps - 30 seconds hold - Corner Balance Feet Together With Eyes Closed  - 1 x daily - 5 x weekly - 1 sets - 3-4 reps - 30 seconds hold - Corner Balance Feet Together: Eyes Open With  Head Turns  - 1 x daily - 5 x weekly - 1 sets - 3-4 reps - 30 seconds hold - Romberg Stance with Head Nods  - 1 x daily - 5 x weekly - 2-3 sets - 10 reps - Tandem Walking with Counter Support  - 1 x daily - 5 x weekly - 3 sets - 10 reps  Chair yoga poses reviewed 5/7  GOALS: Goals reviewed with patient? Yes  SHORT TERM GOALS: Target date: 02/25/2024  Pt will be independent and compliant with initial strength and balance focused HEP in order to maintain functional progress and improve mobility. Baseline:  IND and compliant (5/7) Goal status: MET  2.  Pt will decrease 5xSTS to </=12 seconds w/ improved upright neutral in order to demonstrate decreased risk for falls and improved functional bilateral LE strength and power. Baseline: 15.13 sec w/ light BUE support and severe lumbar hyperextension on upright; 15.21 sec w/o UE support (5/7) Goal status: IN PROGRESS  LONG TERM GOALS: Target date: 03/24/2024  Pt will be independent and compliant with advanced and finalized strength and balance focused HEP in order to maintain functional progress and improve mobility. Baseline: To be established. Goal status: INITIAL  2.  Pt will demonstrate a gait speed of >/=4.08 feet/sec in order to decrease risk for falls. Baseline: 3.88 ft/sec Goal status: INITIAL  3.  Pt will ambulate >/=500 feet with LRAD at no more than mod I level of assist over unlevel/compliant surfaces, curb step and ramp with improved trunk stability to promote household and community access. Baseline: instability over level ground/SBA Goal status: INITIAL  4.  Pt will improve FGA score to >/=19/30 in order to demonstrate improved balance and decreased fall risk. Baseline: 13/30 (4/11) Goal status: INITIAL  ASSESSMENT:  CLINICAL IMPRESSION: Session limited by ongoing HTN and right-sided spasms.  Attempted to progress HEP with seated variation of yoga for stress management and to continue towards strength and stability goals.   Pt remains asymptomatic throughout session, but was educated for ongoing HTN management with her medical team who are aware of issue.  Will continue per POC if BP allows.  OBJECTIVE IMPAIRMENTS: Abnormal gait, decreased activity tolerance, decreased balance, decreased coordination, decreased knowledge of condition, decreased knowledge of use of DME, decreased strength, increased muscle spasms, impaired sensation, improper body mechanics, postural dysfunction, and pain.   ACTIVITY LIMITATIONS: carrying, lifting, bending, standing, squatting, stairs, transfers, bed mobility, continence, bathing, dressing, self feeding, reach over head, and locomotion level  PARTICIPATION LIMITATIONS: meal prep, cleaning, laundry,  driving, community activity, and occupation  PERSONAL FACTORS: Fitness, Past/current experiences, Transportation, and 3+ comorbidities: HTN, fibromyalgia, DM2  are also affecting patient's functional outcome.   REHAB POTENTIAL: Good  CLINICAL DECISION MAKING: Evolving/moderate complexity  EVALUATION COMPLEXITY: Moderate  PLAN:  PT FREQUENCY: 1x/week  PT DURATION: 8 weeks  PLANNED INTERVENTIONS: 97164- PT Re-evaluation, 97110-Therapeutic exercises, 97530- Therapeutic activity, 97112- Neuromuscular re-education, 97535- Self Care, 16109- Manual therapy, [phone]- Gait training, [phone]- Orthotic Fit/training, [phone]- Aquatic Therapy, [phone]- Electrical stimulation (manual), Patient/Family education, Balance training, Stair training, Taping, Dry Needling, Joint mobilization, Spinal mobilization, Vestibular training, DME instructions, Cryotherapy, and Moist heat  PLAN FOR NEXT SESSION: How is HEP for strength and balance?  Bed mobility for improved efficiency.  Gait training.  weighted vest w/ hurdles/dynamic stability tasks.  RLE NMR.  Blaze pods.  Core stretching/mobility/strength due to cramping in obliques - try thoracic mobilizations w/ foam roller and open books   Earlean Glaze, PT,  DPT 03/01/2024, 11:45 AM

## 2024-03-01 NOTE — Telephone Encounter (Signed)
 Please review and advise.

## 2024-03-02 ENCOUNTER — Encounter: Payer: Self-pay | Admitting: "Endocrinology

## 2024-03-02 ENCOUNTER — Other Ambulatory Visit: Payer: Self-pay | Admitting: "Endocrinology

## 2024-03-02 DIAGNOSIS — E11649 Type 2 diabetes mellitus with hypoglycemia without coma: Secondary | ICD-10-CM

## 2024-03-02 MED ORDER — GVOKE HYPOPEN 1-PACK 1 MG/0.2ML ~~LOC~~ SOAJ
1.0000 mg | SUBCUTANEOUS | 2 refills | Status: AC | PRN
Start: 2024-03-02 — End: ?

## 2024-03-02 MED ORDER — BAQSIMI ONE PACK 3 MG/DOSE NA POWD: 1.0000 | NASAL | 3 refills | Status: AC | PRN

## 2024-03-02 NOTE — Telephone Encounter (Signed)
 Indapamide  dose recently increased. Will need to have repeat labs as previously ordered prior to medication changes.   Sharin Altidor S Aashi Derrington, NP

## 2024-03-06 ENCOUNTER — Encounter: Payer: Self-pay | Admitting: Dietician

## 2024-03-06 ENCOUNTER — Encounter: Attending: "Endocrinology | Admitting: Dietician

## 2024-03-06 ENCOUNTER — Ambulatory Visit: Admitting: Occupational Therapy

## 2024-03-06 DIAGNOSIS — R278 Other lack of coordination: Secondary | ICD-10-CM

## 2024-03-06 DIAGNOSIS — R208 Other disturbances of skin sensation: Secondary | ICD-10-CM

## 2024-03-06 DIAGNOSIS — R29898 Other symptoms and signs involving the musculoskeletal system: Secondary | ICD-10-CM | POA: Diagnosis not present

## 2024-03-06 DIAGNOSIS — E119 Type 2 diabetes mellitus without complications: Secondary | ICD-10-CM | POA: Insufficient documentation

## 2024-03-06 DIAGNOSIS — M6281 Muscle weakness (generalized): Secondary | ICD-10-CM | POA: Diagnosis not present

## 2024-03-06 DIAGNOSIS — R29818 Other symptoms and signs involving the nervous system: Secondary | ICD-10-CM | POA: Diagnosis not present

## 2024-03-06 DIAGNOSIS — I69351 Hemiplegia and hemiparesis following cerebral infarction affecting right dominant side: Secondary | ICD-10-CM | POA: Diagnosis not present

## 2024-03-06 DIAGNOSIS — R2689 Other abnormalities of gait and mobility: Secondary | ICD-10-CM | POA: Diagnosis not present

## 2024-03-06 DIAGNOSIS — R2681 Unsteadiness on feet: Secondary | ICD-10-CM | POA: Diagnosis not present

## 2024-03-06 NOTE — Patient Instructions (Addendum)
+   Switch between writing and erasing    +putty press through palm

## 2024-03-06 NOTE — Patient Instructions (Addendum)
 Ask your endocrinologist about a potential corrective insulin  dose if your glucose goes over 200 mg/dL.  Use your carb counting list to try your best to eat 30-45 grams of carbs per meal! Look for a rise of no more than 40-60 points within 2 hours after you eat.  If your blood sugar raises higher than 180 mg/dL, get a glass of water and go for a 10-15 minute walk.  If you notice a particular food raises your blood sugar significantly, please log the type of food and amount you had. Include every single thing you ate or drank.  Work towards eating three meals a day, about 5-6 hours apart!  If having a snack, use your snack to have a balanced snack in the right portion sizes.

## 2024-03-06 NOTE — Progress Notes (Signed)
 Diabetes Self-Management Education  Visit Type: Follow-up  Appt. Start Time: 1500 Appt. End Time: 1600  03/06/2024  [name], identified by name and date of birth, is a 48 y.o. female with a diagnosis of Diabetes: Type 2.   ASSESSMENT Pt reports bouts of frequent hypoglycemia since last visit, was taken off of Lantus  4 days ago. Pt reports a little more difficulty with glycemic control since then. Pt states they have been using Humalog  as a corrective dose for post prandial hyperglycemia following sliding scale guidelines, which has led to subsequent hypoglycemia. Pt reports treating lows with juice, bread or PB, and these items would spike glucose >200 mg/dL. Pt reports not having to take Humalog  very often, and was never taking 15 minutes prior to meals. Pt reports continuing to take metformin  BID, Ozempic  @1  mg weekly. Pt reports frustration over carbohydrate intake and uncertainty in glycemic response. Pt states they will "cheat" occasionally with foods they have been trying to avoid.  CGM Report (90 days): RD reset CGM range to 70-180 mg/dL for report >161: 2% 096-045: 9% 70 - 180: 88% <70: 1% Daily patterns indicate good glucose control throughout the day, with some highs happening mostly in the afternoon and/or overnight (>200 mg/dL). Average glucose 131 mg/dL (90 day).   Diabetes Self-Management Education - 03/06/24 1639       Visit Information   Visit Type Follow-up      Initial Visit   Diabetes Type Type 2    Are you taking your medications as prescribed? No      Psychosocial Assessment   Patient Belief/Attitude about Diabetes Afraid    What is the hardest part about your diabetes right now, causing you the most concern, or is the most worrisome to you about your diabetes?   Taking/obtaining medications;Making healty food and beverage choices;Getting support / problem solving    Self-management support Doctor's office;Internet communities    Other persons  present Patient    Patient Concerns Glycemic Control;Medication;Nutrition/Meal planning    Special Needs None      Pre-Education Assessment   Patient understands the diabetes disease and treatment process. Needs Review    Patient understands incorporating nutritional management into lifestyle. Needs Review    Patient undertands incorporating physical activity into lifestyle. Needs Review    Patient understands using medications safely. Needs Review    Patient understands monitoring blood glucose, interpreting and using results Comprehends key points    Patient understands prevention, detection, and treatment of acute complications. Needs Review    Patient understands prevention, detection, and treatment of chronic complications. Compreheands key points    Patient understands how to develop strategies to address psychosocial issues. Needs Review    Patient understands how to develop strategies to promote health/change behavior. Comprehends key points      Complications   Last HgB A1C per patient/outside source 6.4 %    How often do you check your blood sugar? > 4 times/day    Fasting Blood glucose range (mg/dL) [phone];91-478    Postprandial Blood glucose range (mg/dL) >295;621-308;657-846    Number of hypoglycemic episodes per month 10    Can you tell when your blood sugar is low? Yes    What do you do if your blood sugar is low? Juice, bread, PB    Number of hyperglycemic episodes ( >200mg /dL): Weekly    Can you tell when your blood sugar is high? No      Activity / Exercise   Activity / Exercise  Type ADL's;Light (walking / raking leaves)    How many days per week do you exercise? 5    How many minutes per day do you exercise? 15    Total minutes per week of exercise 75      Patient Education   Healthy Eating Carbohydrate counting;Reviewed blood glucose goals for pre and post meals and how to evaluate the patients' food intake on their blood glucose level.;Meal timing in regards to  the patients' current diabetes medication.    Being Active Identified with patient nutritional and/or medication changes necessary with exercise.    Medications Taught/reviewed insulin /injectables, injection, site rotation, insulin /injectables storage and needle disposal.;Reviewed patients medication for diabetes, action, purpose, timing of dose and side effects.    Monitoring Taught/evaluated CGM (comment)   TIR, Daily Patterns   Acute complications Taught prevention, symptoms, and  treatment of hypoglycemia - the 15 rule.;Discussed and identified patients' prevention, symptoms, and treatment of hyperglycemia.    Diabetes Stress and Support Identified and addressed patients feelings and concerns about diabetes      Individualized Goals (developed by patient)   Nutrition Follow meal plan discussed;Carb counting    Physical Activity Exercise 5-7 days per week    Medications take my medication as prescribed    Monitoring  Consistenly use CGM    Problem Solving Eating Pattern;Medication consistency    Reducing Risk examine blood glucose patterns;treat hypoglycemia with 15 grams of carbs if blood glucose less than 70mg /dL    Health Coping Ask for help with psychological, social, or emotional issues      Patient Self-Evaluation of Goals - Patient rates self as meeting previously set goals (% of time)   Nutrition 50 - 75 % (half of the time)    Physical Activity 50 - 75 % (half of the time)    Medications >75% (most of the time)    Monitoring >75% (most of the time)    Problem Solving and behavior change strategies  25 - 50% (sometimes)    Reducing Risk (treating acute and chronic complications) 50 - 75 % (half of the time)    Health Coping 50 - 75 % (half of the time)      Post-Education Assessment   Patient understands the diabetes disease and treatment process. Comprehends key points    Patient understands incorporating nutritional management into lifestyle. Comprehends key points    Patient  undertands incorporating physical activity into lifestyle. Comprehends key points    Patient understands using medications safely. Comphrehends key points    Patient understands monitoring blood glucose, interpreting and using results Comprehends key points    Patient understands prevention, detection, and treatment of acute complications. Demonstrates understanding / competency    Patient understands prevention, detection, and treatment of chronic complications. Comprehends key points    Patient understands how to develop strategies to address psychosocial issues. Comprehends key points    Patient understands how to develop strategies to promote health/change behavior. Comprehends key points      Outcomes   Expected Outcomes Demonstrated interest in learning. Expect positive outcomes    Future DMSE 4-6 wks    Program Status Not Completed      Subsequent Visit   Since your last visit have you continued or begun to take your medications as prescribed? No   Using mealtime insulin  as corrective dose   Since your last visit have you experienced any weight changes? Loss    Since your last visit, are you checking your blood glucose  at least once a day? Yes   CGM            Individualized Plan for Diabetes Self-Management Training:   Learning Objective:  Patient will have a greater understanding of diabetes self-management. Patient education plan is to attend individual and/or group sessions per assessed needs and concerns.   Plan:   Patient Instructions  Ask your endocrinologist about a potential corrective insulin  dose if your glucose goes over 200 mg/dL.  Use your carb counting list to try your best to eat 30-45 grams of carbs per meal! Look for a rise of no more than 40-60 points within 2 hours after you eat.  If your blood sugar raises higher than 180 mg/dL, get a glass of water and go for a 10-15 minute walk.  If you notice a particular food raises your blood sugar significantly,  please log the type of food and amount you had. Include every single thing you ate or drank.  Work towards eating three meals a day, about 5-6 hours apart!  If having a snack, use your snack to have a balanced snack in the right portion sizes.    Expected Outcomes:  Demonstrated interest in learning. Expect positive outcomes  Education material provided: Snack sheet  If problems or questions, patient to contact team via:  Phone and Email  Future DSME appointment: 4-6 wks

## 2024-03-06 NOTE — Therapy (Signed)
 OUTPATIENT OCCUPATIONAL THERAPY NEURO TREATMENT  Patient Name: Amber Rose MRN: [id number] DOB:1976-05-06, 48 y.o., female Today's Date: 03/06/2024  PCP: Joaquin Mulberry, MD  REFERRING PROVIDER: Johny Nap, NP  END OF SESSION:  OT End of Session - 03/06/24 1020     Visit Number 2    Number of Visits 17    Date for OT Re-Evaluation 05/05/24    Authorization Type [employer] Medicare primary; [place] Medicaid secondary    OT Start Time 1020    OT Stop Time 1058    OT Time Calculation (min) 38 min    Activity Tolerance Patient tolerated treatment well    Behavior During Therapy WFL for tasks assessed/performed             Past Medical History:  Diagnosis Date   Abnormal Pap smear of cervix    yrs ago   Adenomyosis    Allergy     Anemia    Anxiety    Aortic atherosclerosis (HCC)    Arthritis    Asthma    Chronic UTI    Clostridium difficile infection    Depression    Diabetes mellitus without complication (HCC) 10/22/2023   A1C 8.8   Diverticulosis    Endometriosis    Fibroid    Fibromyalgia    GERD (gastroesophageal reflux disease)    Hiatal hernia    Hyperlipidemia    Hypertension    Internal hemorrhoids    Meningitis, viral    Migraines    Pancreatitis    Pure hypercholesterolemia 12/23/2020   Tubular adenoma of colon    Past Surgical History:  Procedure Laterality Date   APPENDECTOMY  1990   CERVICAL BIOPSY  W/ LOOP ELECTRODE EXCISION     LEEP   CESAREAN SECTION  2002   CHOLECYSTECTOMY  2011   COLONOSCOPY     UPPER GASTROINTESTINAL ENDOSCOPY     URINARY SURGERY     urethra sling, removal, and then revision 2016 (4 surgeries)   WISDOM TOOTH EXTRACTION     Patient Active Problem List   Diagnosis Date Noted   CVA (cerebral vascular accident) (HCC) 01/08/2024   Leukocytosis 01/08/2024   Type 2 diabetes mellitus with chronic kidney disease, with long-term current use of insulin  (HCC) 01/08/2024   Hyperlipidemia 01/08/2024   GERD (gastroesophageal  reflux disease) 01/08/2024   Overweight (BMI 25.0-29.9) 01/08/2024   Moderately severe major depression (HCC) 01/06/2024   Yeast vaginitis 08/30/2023   Iron  deficiency anemia 03/11/2023   PMDD (premenstrual dysphoric disorder) 11/17/2022   Abnormal uterine bleeding (AUB) 11/17/2022   Statin myopathy 09/23/2022   Steatorrhea 08/17/2022   Irregular menses 11/28/2021   Perimenopause 11/28/2021   Episode of recurrent major depressive disorder (HCC) 04/09/2021   BMI 33.0-33.9,adult 04/09/2021   Pure hypercholesterolemia 12/23/2020   Menorrhagia with regular cycle 09/13/2020   Well woman exam 09/13/2020   History of herpes zoster 01/17/2018   Overactive bladder 12/28/2017   Recurrent infections 11/19/2017   Cyst of nasal cavity 11/19/2017   Angio-edema 11/19/2017   Abdominal pain, epigastric 11/04/2017   Dysphagia 11/04/2017   Gastroesophageal reflux disease 07/12/2017   Degenerative disc disease at L5-S1 level 05/14/2017   Fibromyalgia 05/14/2017   ADD (attention deficit disorder) 05/14/2017   Urinary incontinence 04/05/2017   Thigh pain 02/02/2017   Anxiety and depression 02/02/2017   Tobacco abuse 02/02/2017   Facial flushing 02/02/2017   Hypertension 10/23/2016   Hidradenitis 10/23/2016   Diabetes (HCC) 09/12/2014   Lupus anticoagulant positive 07/03/2014   Vitamin  D deficiency 06/06/2014    ONSET DATE: 01/31/2024 (Date of referral)   REFERRING DIAG: I63.81 (ICD-10-CM) - Thalamic stroke (HCC) G81.91 (ICD-10-CM) - Right hemiparesis (HCC) R29.818 (ICD-10-CM) - Hemisensory deficit  THERAPY DIAG:  Muscle weakness (generalized)  Hemiplegia and hemiparesis following cerebral infarction affecting right dominant side (HCC)  Other lack of coordination  Other symptoms and signs involving the musculoskeletal system  Other symptoms and signs involving the nervous system  Other disturbances of skin sensation  Rationale for Evaluation and Treatment:  Rehabilitation  SUBJECTIVE:   SUBJECTIVE STATEMENT: She reports the Baclofen  has been very helpful with L sided pain.   Pt accompanied by: self  PERTINENT HISTORY: PMH: HTN, anxiety, depression, fibromyalgia, herpes zoster, DM2, statin intolerance, thalamic CVA, CKD   PRECAUTIONS: Fall  WEIGHT BEARING RESTRICTIONS: No  PAIN:  Are you having pain? Yes: NPRS scale: 8-7/10 Pain location: R side of body (more severe from elbow to shoulder) Pain description: buzzing Aggravating factors: movement Relieving factors: rest  FALLS: Has patient fallen in last 6 months? 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  HAND DOMINANCE: Right  ADLs: Eating: setup to cut food Grooming: mod I  UB Dressing: min A to fasten bra LB Dressing: mod I  Toileting: mod I Bathing: mod I Tub Shower transfers: mod I Equipment: Grab bars and handheld shower head  IADLs: Shopping: dependent Light housekeeping: dependent Meal Prep: mod I for light meal prep; dependent for larger meals, which she would have cooked before her stroke Community mobility: dependent Medication management: mod I Financial management: mod I Handwriting: 25% legible and Increased time  MOBILITY STATUS: mod I  ACTIVITY TOLERANCE: Activity tolerance: fair  FUNCTIONAL OUTCOME MEASURES: PSFS: 2.7 total score  Total score = sum of the activity scores/number of activities Minimum detectable change (90%CI) for average score = 2 points Minimum detectable change (90%CI) for single activity score = 3 points  UPPER EXTREMITY ROM:    BUE: WFL though bradykinesia with RUE and  impaired opposition  UPPER EXTREMITY MMT:   Poor endurance with RUE testing  MMT Right (eval) Left (eval)  Shoulder flexion 3/5 WNL  Shoulder abduction 3/5 WNL  Elbow flexion 4-/5 WNL  Elbow extension 3/5 WNL  (Blank rows = not tested)  HAND FUNCTION: Grip strength: Right: 25.3 lbs; Left: 49.6 lbs  COORDINATION: 9 Hole Peg test: Right: 37 sec; Left: 24 sec  SENSATION: Light touch: Impaired   EDEMA: none reported or observed  MUSCLE TONE: WFL  COGNITION: Overall cognitive status: Within functional limits for tasks assessed  VISION: Subjective report: no changes  VISION ASSESSMENT: WFL  PERCEPTION: Not tested  PRAXIS: Not tested  OBSERVATIONS: Pt appears well-kept.  TREATMENT :    OT initiated RUE red theraputty exercises (putty press downs, search, grip, and pinch) as noted in patient instructions for coordination and strength   OT initiated FM coordination HEP (handout provided, see pt instructions) - to improve R UE FM coordination, dexterity, proprioception. Pt returned demonstration of each exercise: Picking up objects and placing in a container with slot Stacking towers of coins  Finger-to-palm then palm-to-finger translation of small items Shuffling cards Turning cards over 1 at a time Hold deck of cards in palm of hand and push off 1 card at a time from the top of the deck using only thumb Tear piece of tissue and rolling into small balls with fingertips Meditation balls - clockwise then counterclockwise Rolling pen between fingers and thumb, pen "twirl" (rotation), pen "shift" (translation)  Switch between writing and erasing  PATIENT EDUCATION: Education details: putty and coordination HEPs Person educated: Patient Education method: Explanation, Demonstration, Tactile cues, Verbal cues, and Handouts Education comprehension:  verbalized understanding, returned demonstration, verbal cues required, and needs further education  HOME EXERCISE PROGRAM: 03/06/2024 putty and coordination HEPs  GOALS:  SHORT TERM GOALS: Target date: 03/31/2024    Patient will demonstrate initial R UE HEP with 25% verbal cues or less for proper execution. Baseline: Goal status: INITIAL  2.  Pt will independently recall the 5 main sensory precautions (cold, heat, sharp, chemical, and heavy) as needed to prevent injury/harm secondary to impairments.   Baseline:  Goal status: INITIAL   LONG TERM GOALS: Target date: 05/05/2024  Patient will demonstrate updated R UE HEP with visual handouts only for proper execution. Baseline:  Goal status: INITIAL  2.  Patient will demonstrate at least 35 lbs R grip strength as needed to open jars and other containers. Baseline: Right: 25.3 lbs Goal status: INITIAL  3.  Patient will demo improved FM coordination as evidenced by completing nine-hole peg with use of R in 30 seconds or less.  Baseline:  Right: 37 sec Goal status: INITIAL  4.  Patient will report at least two-point increase in average PSFS score or at least three-point increase in a single activity score indicating functionally significant improvement given minimum detectable change.  Baseline: 2.7 total score (See above for individual activity scores) Goal status: INITIAL  ASSESSMENT:  CLINICAL IMPRESSION: Patient demonstrates good understanding of HEP this visit. May require modifications given sensory involvement.   PERFORMANCE DEFICITS: in functional skills including ADLs, IADLs, coordination, sensation, strength, pain, Fine motor control, Gross motor control, and UE functional use.   IMPAIRMENTS: are limiting patient from ADLs, IADLs, rest and sleep, work, and social participation.   CO-MORBIDITIES: may have co-morbidities  that affects occupational performance. Patient will benefit from skilled OT to address above  impairments and improve overall function.  REHAB POTENTIAL: Good  PLAN:  OT FREQUENCY: 2x/week  OT DURATION: 8 weeks  PLANNED INTERVENTIONS: 97168 OT Re-evaluation, 97535 self care/ADL training, 16109 therapeutic exercise, 97530 therapeutic activity, 97112 neuromuscular re-education, 97140 manual therapy, 97035 ultrasound, 97018 paraffin, 60454 fluidotherapy, 97010 moist heat, 97032 electrical stimulation (manual), 97760 Orthotic Initial, 97763 Orthotic/Prosthetic subsequent, energy conservation, coping strategies training, patient/family education, and DME and/or AE instructions  RECOMMENDED OTHER SERVICES: N/A for this visit  CONSULTED AND AGREED WITH PLAN OF CARE: Patient  PLAN FOR NEXT SESSION: review RUE coordination and red putty HEPs; golf solitaire   Altamease Asters, OT 03/06/2024, 10:59 AM

## 2024-03-08 ENCOUNTER — Ambulatory Visit: Admitting: Physical Therapy

## 2024-03-08 ENCOUNTER — Encounter: Payer: Self-pay | Admitting: Family Medicine

## 2024-03-08 ENCOUNTER — Encounter: Payer: Self-pay | Admitting: Physical Therapy

## 2024-03-08 DIAGNOSIS — R2689 Other abnormalities of gait and mobility: Secondary | ICD-10-CM

## 2024-03-08 DIAGNOSIS — R29818 Other symptoms and signs involving the nervous system: Secondary | ICD-10-CM | POA: Diagnosis not present

## 2024-03-08 DIAGNOSIS — I69351 Hemiplegia and hemiparesis following cerebral infarction affecting right dominant side: Secondary | ICD-10-CM

## 2024-03-08 DIAGNOSIS — I10 Essential (primary) hypertension: Secondary | ICD-10-CM | POA: Diagnosis not present

## 2024-03-08 DIAGNOSIS — R29898 Other symptoms and signs involving the musculoskeletal system: Secondary | ICD-10-CM | POA: Diagnosis not present

## 2024-03-08 DIAGNOSIS — M6281 Muscle weakness (generalized): Secondary | ICD-10-CM

## 2024-03-08 DIAGNOSIS — R278 Other lack of coordination: Secondary | ICD-10-CM

## 2024-03-08 DIAGNOSIS — R2681 Unsteadiness on feet: Secondary | ICD-10-CM | POA: Diagnosis not present

## 2024-03-08 DIAGNOSIS — Z5181 Encounter for therapeutic drug level monitoring: Secondary | ICD-10-CM | POA: Diagnosis not present

## 2024-03-08 DIAGNOSIS — R208 Other disturbances of skin sensation: Secondary | ICD-10-CM

## 2024-03-08 NOTE — Therapy (Signed)
 OUTPATIENT PHYSICAL THERAPY NEURO TREATMENT   Patient Name: Amber Rose MRN: [id number] DOB:[birth year], 48 y.o., female Today's Date: 03/08/2024   PCP: Joaquin Mulberry, MD REFERRING PROVIDER: Hassie Lint, PA-C  END OF SESSION:  PT End of Session - 03/08/24 1112     Visit Number 7    Number of Visits 9   8 + eval   Date for PT Re-Evaluation 03/31/24   pushed out due to future multi-disciplinary needs   Authorization Type [employer] dual complete    PT Start Time 1105    PT Stop Time 1158    PT Time Calculation (min) 53 min    Equipment Utilized During Treatment Gait belt    Activity Tolerance Patient tolerated treatment well    Behavior During Therapy WFL for tasks assessed/performed             Past Medical History:  Diagnosis Date   Abnormal Pap smear of cervix    yrs ago   Adenomyosis    Allergy     Anemia    Anxiety    Aortic atherosclerosis (HCC)    Arthritis    Asthma    Chronic UTI    Clostridium difficile infection    Depression    Diabetes mellitus without complication (HCC) 10/22/2023   A1C 8.8   Diverticulosis    Endometriosis    Fibroid    Fibromyalgia    GERD (gastroesophageal reflux disease)    Hiatal hernia    Hyperlipidemia    Hypertension    Internal hemorrhoids    Meningitis, viral    Migraines    Pancreatitis    Pure hypercholesterolemia 12/23/2020   Tubular adenoma of colon    Past Surgical History:  Procedure Laterality Date   APPENDECTOMY  1990   CERVICAL BIOPSY  W/ LOOP ELECTRODE EXCISION     LEEP   CESAREAN SECTION  2002   CHOLECYSTECTOMY  2011   COLONOSCOPY     UPPER GASTROINTESTINAL ENDOSCOPY     URINARY SURGERY     urethra sling, removal, and then revision 2016 (4 surgeries)   WISDOM TOOTH EXTRACTION     Patient Active Problem List   Diagnosis Date Noted   CVA (cerebral vascular accident) (HCC) 01/08/2024   Leukocytosis 01/08/2024   Type 2 diabetes mellitus with chronic kidney disease, with long-term current  use of insulin  (HCC) 01/08/2024   Hyperlipidemia 01/08/2024   GERD (gastroesophageal reflux disease) 01/08/2024   Overweight (BMI 25.0-29.9) 01/08/2024   Moderately severe major depression (HCC) 01/06/2024   Yeast vaginitis 08/30/2023   Iron  deficiency anemia 03/11/2023   PMDD (premenstrual dysphoric disorder) 11/17/2022   Abnormal uterine bleeding (AUB) 11/17/2022   Statin myopathy 09/23/2022   Steatorrhea 08/17/2022   Irregular menses 11/28/2021   Perimenopause 11/28/2021   Episode of recurrent major depressive disorder (HCC) 04/09/2021   BMI 33.0-33.9,adult 04/09/2021   Pure hypercholesterolemia 12/23/2020   Menorrhagia with regular cycle 09/13/2020   Well woman exam 09/13/2020   History of herpes zoster 01/17/2018   Overactive bladder 12/28/2017   Recurrent infections 11/19/2017   Cyst of nasal cavity 11/19/2017   Angio-edema 11/19/2017   Abdominal pain, epigastric 11/04/2017   Dysphagia 11/04/2017   Gastroesophageal reflux disease 07/12/2017   Degenerative disc disease at L5-S1 level 05/14/2017   Fibromyalgia 05/14/2017   ADD (attention deficit disorder) 05/14/2017   Urinary incontinence 04/05/2017   Thigh pain 02/02/2017   Anxiety and depression 02/02/2017   Tobacco abuse 02/02/2017   Facial flushing 02/02/2017  Hypertension 10/23/2016   Hidradenitis 10/23/2016   Diabetes (HCC) 09/12/2014   Lupus anticoagulant positive 07/03/2014   Vitamin D  deficiency 06/06/2014    ONSET DATE: 01/07/2024 ([place] CVA)  REFERRING DIAG: G81.91 (ICD-10-CM) - Right hemiparesis (HCC) I63.9 (ICD-10-CM) - Cerebrovascular accident (CVA), unspecified mechanism (HCC)  THERAPY DIAG:  Muscle weakness (generalized)  Hemiplegia and hemiparesis following cerebral infarction affecting right dominant side (HCC)  Other lack of coordination  Other symptoms and signs involving the musculoskeletal system  Other symptoms and signs involving the nervous system  Other disturbances of skin  sensation  Other abnormalities of gait and mobility  Unsteadiness on feet  Rationale for Evaluation and Treatment: Rehabilitation  SUBJECTIVE:                                                                                                                                                                                             SUBJECTIVE STATEMENT: Pt reports the baclofen  has helped her stiffness, some ongoing cramping in right arm, but she feels sluggish from a cognitive perspective (somewhat tired).  Her most recent blood sugar is 116 following MD instructed removal of one of her insulins.  She is back to diet management to prevent high swings. Pt accompanied by: self (family dropped her off)  PERTINENT HISTORY: HTN, anxiety, depression, fibromyalgia, herpes zoster, DM2, statin intolerance, thalamic CVA, CKD  PAIN:  Are you having pain? Yes: NPRS scale: 7 Pain location: right leg is more noticeable, right arm just feels "dead" but this is unchanged Pain description: cramping and spasms/stiffness/numbness in right hemibody Aggravating factors: using the arm, laying on the right Relieving factors: sitting still  PRECAUTIONS: Fall  RED FLAGS: Bowel or bladder incontinence: Yes: urinary incontinence - wearing depends today and MD aware per report   WEIGHT BEARING RESTRICTIONS: No  FALLS: Has patient fallen in last 6 months? No  LIVING ENVIRONMENT: Lives with: lives with an adult companion Lives in: House/apartment Stairs: Yes: External: 3 steps; none Has following equipment at home: Grab bars  PLOF: Independent with gait, Independent with transfers, Needs assistance with ADLs, and Needs assistance with homemaking  PATIENT GOALS: "Everything is really up top so I don't really know about PT."  OBJECTIVE:  Note: Objective measures were completed at Evaluation unless otherwise noted.  DIAGNOSTIC FINDINGS:  MR Brain 01/08/2024: IMPRESSION: Acute lacunar infarct in the left  thalamus.  COGNITION: Overall cognitive status: pt reports some difficulty with memory/losing her place with tasks due to focus   SENSATION: Light touch: WFL - pt reports decreased R > L  COORDINATION: LE RAMS;  impaired Heel-to-shin:  WNL bilaterally  EDEMA:  WNL  MUSCLE TONE: Difficulty discerning flexion spasticity due to pt difficulty relaxing  POSTURE: rounded shoulders and forward head  LOWER EXTREMITY ROM:     Active  Right Eval Left Eval  Hip flexion WNL WNL  Hip extension    Hip abduction    Hip adduction    Hip internal rotation    Hip external rotation    Knee flexion " "  Knee extension " "  Ankle dorsiflexion " "  Ankle plantarflexion    Ankle inversion    Ankle eversion     (Blank rows = not tested)  LOWER EXTREMITY MMT:    MMT Right Eval Left Eval  Hip flexion 4-/5 4+/5  Hip extension    Hip abduction    Hip adduction    Hip internal rotation    Hip external rotation    Knee flexion 4-/5 4+/5  Knee extension 4/5 5/5  Ankle dorsiflexion 4+/5 5/5  Ankle plantarflexion    Ankle inversion    Ankle eversion    (Blank rows = not tested)  BED MOBILITY:  Pt sleeps in standard bed and requires increased time and momentum to get roll and has difficulty managing bedding  TRANSFERS: Assistive device utilized: None  Sit to stand: SBA Stand to sit: SBA Chair to chair: SBA  GAIT: Gait pattern: bilateral ER, step through pattern, decreased stride length, decreased hip/knee flexion- Right, decreased hip/knee flexion- Left, decreased ankle dorsiflexion- Right, decreased ankle dorsiflexion- Left, trendelenburg, and wide BOS Distance walked: various clinic distances Assistive device utilized: None Level of assistance: SBA Comments: Pt has severe drift from pathway to left then right when entering clinic, increased time to correct path with cuing.  Truncal ataxia?  FUNCTIONAL TESTS:  5 times sit to stand: 15.13 sec w/ light BUE support, severe  hyperextension in standing, mild weight shift left during sitting Timed up and go (TUG): 11.28 sec SBA, no instability in turning with small radius 10 meter walk test: 8.50 sec = 1.18 m/sec OR 3.88 ft/sec Functional gait assessment: To be assessed.  PATIENT SURVEYS:  None completed due to time.                                                                                                                              TREATMENT DATE: 03/08/2024  -Deadbug 2x10, min-mod cuing for coordination of task -Thoracic open books x15 each side, used shorted arm position for improved shoulder comfort -Using midline bolster for thoracic mobilization w/ shoulder flexion x15 > shoulder horizontal adduction/abduction x15 > IR/ER at 90 deg; cues to slow reps down -Quadruped hip circles x5 CW > CCW -Wide child's pose 3x30 sec -Quadruped cat/cow x20 using paced breathing, mild thoracic extension stiffness -Modified birddog arms only x8 > legs only x8 > birddog x8 w/ CGA -Thread the needle x8 w/ 3 breath cycle each rep each side, tried elongated version w/ pt unable to maintain -Seated sidebending stretch using small support  to improve shoulder comfort 2x30 sec each side, increased R shoulder tightness in overhead ROM -Seated lateral rollouts x10 each side into deepened ROM for side stretch  PATIENT EDUCATION: Education details:  BP monitoring at home/logging if able.  Continue HEP if BP allows - reminder of modifying based on safe limits.  Discussed current HEP - she endorses it remains moderately challenging especially keeping up with other discipline HEPs.  Will advance to dynamic stability challenges and higher level strengthening in future visit. Person educated: Patient Education method: Explanation Education comprehension: verbalized understanding and needs further education  HOME EXERCISE PROGRAM: Access Code: [email or web address] URL: [email or web address] Date: 02/04/2024 Prepared by: Marilou Showman  Exercises - Sit to Stand with Armchair  - 1 x daily - 7 x weekly - 3 sets - 10 reps - Corner Balance Feet Together With Eyes Open  - 1 x daily - 5 x weekly - 1 sets - 3-4 reps - 30 seconds hold - Corner Balance Feet Together With Eyes Closed  - 1 x daily - 5 x weekly - 1 sets - 3-4 reps - 30 seconds hold - Corner Balance Feet Together: Eyes Open With Head Turns  - 1 x daily - 5 x weekly - 1 sets - 3-4 reps - 30 seconds hold - Romberg Stance with Head Nods  - 1 x daily - 5 x weekly - 2-3 sets - 10 reps - Tandem Walking with Counter Support  - 1 x daily - 5 x weekly - 3 sets - 10 reps  Chair yoga poses reviewed 5/7  GOALS: Goals reviewed with patient? Yes  SHORT TERM GOALS: Target date: 02/25/2024  Pt will be independent and compliant with initial strength and balance focused HEP in order to maintain functional progress and improve mobility. Baseline:  IND and compliant (5/7) Goal status: MET  2.  Pt will decrease 5xSTS to </=12 seconds w/ improved upright neutral in order to demonstrate decreased risk for falls and improved functional bilateral LE strength and power. Baseline: 15.13 sec w/ light BUE support and severe lumbar hyperextension on upright; 15.21 sec w/o UE support (5/7) Goal status: IN PROGRESS  LONG TERM GOALS: Target date: 03/24/2024  Pt will be independent and compliant with advanced and finalized strength and balance focused HEP in order to maintain functional progress and improve mobility. Baseline: To be established. Goal status: INITIAL  2.  Pt will demonstrate a gait speed of >/=4.08 feet/sec in order to decrease risk for falls. Baseline: 3.88 ft/sec Goal status: INITIAL  3.  Pt will ambulate >/=500 feet with LRAD at no more than mod I level of assist over unlevel/compliant surfaces, curb step and ramp with improved trunk stability to promote household and community access. Baseline: instability over level ground/SBA Goal status: INITIAL  4.  Pt will  improve FGA score to >/=19/30 in order to demonstrate improved balance and decreased fall risk. Baseline: 13/30 (4/11) Goal status: INITIAL  ASSESSMENT:  CLINICAL IMPRESSION: Emphasis of skilled session on addressing core stability and cramping impacting oblique area (right worse than left).  Pt has noted right shoulder tightness during overhead tasks today.  Her overall response to therapeutic intervention today was the best so far this session and she continues to benefit from skilled PT to further improve dynamic stability, upper body tightness, and pain management.  Will continue per POC.  OBJECTIVE IMPAIRMENTS: Abnormal gait, decreased activity tolerance, decreased balance, decreased coordination, decreased knowledge of condition, decreased knowledge of use of DME, decreased strength, increased  muscle spasms, impaired sensation, improper body mechanics, postural dysfunction, and pain.   ACTIVITY LIMITATIONS: carrying, lifting, bending, standing, squatting, stairs, transfers, bed mobility, continence, bathing, dressing, self feeding, reach over head, and locomotion level  PARTICIPATION LIMITATIONS: meal prep, cleaning, laundry, driving, community activity, and occupation  PERSONAL FACTORS: Fitness, Past/current experiences, Transportation, and 3+ comorbidities: HTN, fibromyalgia, DM2 are also affecting patient's functional outcome.   REHAB POTENTIAL: Good  CLINICAL DECISION MAKING: Evolving/moderate complexity  EVALUATION COMPLEXITY: Moderate  PLAN:  PT FREQUENCY: 1x/week  PT DURATION: 8 weeks  PLANNED INTERVENTIONS: 97164- PT Re-evaluation, 97110-Therapeutic exercises, 97530- Therapeutic activity, 97112- Neuromuscular re-education, 97535- Self Care, 16109- Manual therapy, [phone]- Gait training, [phone]- Orthotic Fit/training, [phone]- Aquatic Therapy, [phone]- Electrical stimulation (manual), Patient/Family education, Balance training, Stair training, Taping, Dry Needling, Joint  mobilization, Spinal mobilization, Vestibular training, DME instructions, Cryotherapy, and Moist heat  PLAN FOR NEXT SESSION: Expand HEP - may benefit from addition of dynamic balance (monster walks, backwards marching, squatting) - REVIEW IN FUTURE SESSION.  Gait training.  weighted vest w/ hurdles/dynamic stability tasks.  RLE NMR.  Blaze pods.  Review birddogs.  Physioball core and seated stability? R shoulder tightness.   Earlean Glaze, PT, DPT 03/08/2024, 12:58 PM

## 2024-03-09 ENCOUNTER — Other Ambulatory Visit: Payer: Self-pay | Admitting: Family Medicine

## 2024-03-09 DIAGNOSIS — N939 Abnormal uterine and vaginal bleeding, unspecified: Secondary | ICD-10-CM

## 2024-03-09 LAB — BASIC METABOLIC PANEL WITH GFR
BUN/Creatinine Ratio: 23 (ref 9–23)
BUN: 28 mg/dL — ABNORMAL HIGH (ref 6–24)
CO2: 21 mmol/L (ref 20–29)
Calcium: 9.5 mg/dL (ref 8.7–10.2)
Chloride: 99 mmol/L (ref 96–106)
Creatinine, Ser: 1.22 mg/dL — ABNORMAL HIGH (ref 0.57–1.00)
Glucose: 212 mg/dL — ABNORMAL HIGH (ref 70–99)
Potassium: 4.7 mmol/L (ref 3.5–5.2)
Sodium: 138 mmol/L (ref 134–144)
eGFR: 55 mL/min/{1.73_m2} — ABNORMAL LOW (ref >59)

## 2024-03-10 ENCOUNTER — Other Ambulatory Visit: Payer: Self-pay | Admitting: "Endocrinology

## 2024-03-10 MED ORDER — SEMAGLUTIDE (1 MG/DOSE) 4 MG/3ML ~~LOC~~ SOPN: 1.0000 mg | PEN_INJECTOR | SUBCUTANEOUS | 0 refills | Status: DC

## 2024-03-12 ENCOUNTER — Other Ambulatory Visit: Payer: Self-pay | Admitting: Family Medicine

## 2024-03-13 ENCOUNTER — Encounter (HOSPITAL_BASED_OUTPATIENT_CLINIC_OR_DEPARTMENT_OTHER): Payer: Self-pay | Admitting: Cardiovascular Disease

## 2024-03-13 ENCOUNTER — Encounter: Payer: Self-pay | Admitting: Occupational Therapy

## 2024-03-13 ENCOUNTER — Ambulatory Visit: Admitting: Occupational Therapy

## 2024-03-13 DIAGNOSIS — R29898 Other symptoms and signs involving the musculoskeletal system: Secondary | ICD-10-CM | POA: Diagnosis not present

## 2024-03-13 DIAGNOSIS — R2689 Other abnormalities of gait and mobility: Secondary | ICD-10-CM | POA: Diagnosis not present

## 2024-03-13 DIAGNOSIS — M6281 Muscle weakness (generalized): Secondary | ICD-10-CM

## 2024-03-13 DIAGNOSIS — R208 Other disturbances of skin sensation: Secondary | ICD-10-CM | POA: Diagnosis not present

## 2024-03-13 DIAGNOSIS — R278 Other lack of coordination: Secondary | ICD-10-CM

## 2024-03-13 DIAGNOSIS — R2681 Unsteadiness on feet: Secondary | ICD-10-CM | POA: Diagnosis not present

## 2024-03-13 DIAGNOSIS — I69351 Hemiplegia and hemiparesis following cerebral infarction affecting right dominant side: Secondary | ICD-10-CM | POA: Diagnosis not present

## 2024-03-13 DIAGNOSIS — R29818 Other symptoms and signs involving the nervous system: Secondary | ICD-10-CM | POA: Diagnosis not present

## 2024-03-13 NOTE — Patient Instructions (Signed)
 Desensitization Techniques  Perform these exercises ever 2 hours for 15 minute sessions.  Progress to the next exercises when the exercises you are doing become easy.  1)  Using light pressure, rub the various textures along with the hypersensitive area:  A.  Flannel  E.  Polyester  B.  Velvet  F.  Corduroy  C.  Wool  G.  Cotton material  D.  Terry cloth  2)  With the same textures use a firmer pressure.  3)  Place your hand/forearm in separate containers of the following items:  A.  Sand  D.  Dry lentil beans  B.  Dry Rice  E.  Dry kidney beans  C.  Ball bearings F.  Dry pinto beans      Safety considerations for loss of sensation:   Look at affected hand when using it!   Do NOT use affected arm for anything: sharp, hot, breakable, or too heavy  Always check temperature of water (for showering, washing dishes, etc) with UNaffected arm/extremity  Consider travel mugs w/ lids to transport hot liquids/coffee  Consider alternative options and/or adaptive equipment to make things safer (ex: hand chopper or cut resistant glove for chopping vegetables, long silicone oven mitts, oven rack pull)   Avoid cold temperatures as well (wear glove in cold temperatures, get ice w/ unaffected extremity)  AVOID handling chemicals and machinery

## 2024-03-13 NOTE — Therapy (Signed)
 OUTPATIENT OCCUPATIONAL THERAPY NEURO TREATMENT  Patient Name: Amber Rose MRN: [id number] DOB:January 16, 1976, 48 y.o., female Today's Date: 03/13/2024  PCP: Joaquin Mulberry, MD  REFERRING PROVIDER: Johny Nap, NP  END OF SESSION:  OT End of Session - 03/13/24 1324     Visit Number 3    Number of Visits 17    Date for OT Re-Evaluation 05/05/24    Authorization Type [employer] Medicare primary; [employer] secondary    OT Start Time 1320    OT Stop Time 1400    OT Time Calculation (min) 40 min    Activity Tolerance Patient tolerated treatment well    Behavior During Therapy WFL for tasks assessed/performed             Past Medical History:  Diagnosis Date   Abnormal Pap smear of cervix    yrs ago   Adenomyosis    Allergy     Anemia    Anxiety    Aortic atherosclerosis (HCC)    Arthritis    Asthma    Chronic UTI    Clostridium difficile infection    Depression    Diabetes mellitus without complication (HCC) 10/22/2023   A1C 8.8   Diverticulosis    Endometriosis    Fibroid    Fibromyalgia    GERD (gastroesophageal reflux disease)    Hiatal hernia    Hyperlipidemia    Hypertension    Internal hemorrhoids    Meningitis, viral    Migraines    Pancreatitis    Pure hypercholesterolemia 12/23/2020   Tubular adenoma of colon    Past Surgical History:  Procedure Laterality Date   APPENDECTOMY  1990   CERVICAL BIOPSY  W/ LOOP ELECTRODE EXCISION     LEEP   CESAREAN SECTION  2002   CHOLECYSTECTOMY  2011   COLONOSCOPY     UPPER GASTROINTESTINAL ENDOSCOPY     URINARY SURGERY     urethra sling, removal, and then revision 2016 (4 surgeries)   WISDOM TOOTH EXTRACTION     Patient Active Problem List   Diagnosis Date Noted   CVA (cerebral vascular accident) (HCC) 01/08/2024   Leukocytosis 01/08/2024   Type 2 diabetes mellitus with chronic kidney disease, with long-term current use of insulin  (HCC) 01/08/2024   Hyperlipidemia 01/08/2024   GERD (gastroesophageal  reflux disease) 01/08/2024   Overweight (BMI 25.0-29.9) 01/08/2024   Moderately severe major depression (HCC) 01/06/2024   Yeast vaginitis 08/30/2023   Iron  deficiency anemia 03/11/2023   PMDD (premenstrual dysphoric disorder) 11/17/2022   Abnormal uterine bleeding (AUB) 11/17/2022   Statin myopathy 09/23/2022   Steatorrhea 08/17/2022   Irregular menses 11/28/2021   Perimenopause 11/28/2021   Episode of recurrent major depressive disorder (HCC) 04/09/2021   BMI 33.0-33.9,adult 04/09/2021   Pure hypercholesterolemia 12/23/2020   Menorrhagia with regular cycle 09/13/2020   Well woman exam 09/13/2020   History of herpes zoster 01/17/2018   Overactive bladder 12/28/2017   Recurrent infections 11/19/2017   Cyst of nasal cavity 11/19/2017   Angio-edema 11/19/2017   Abdominal pain, epigastric 11/04/2017   Dysphagia 11/04/2017   Gastroesophageal reflux disease 07/12/2017   Degenerative disc disease at L5-S1 level 05/14/2017   Fibromyalgia 05/14/2017   ADD (attention deficit disorder) 05/14/2017   Urinary incontinence 04/05/2017   Thigh pain 02/02/2017   Anxiety and depression 02/02/2017   Tobacco abuse 02/02/2017   Facial flushing 02/02/2017   Hypertension 10/23/2016   Hidradenitis 10/23/2016   Diabetes (HCC) 09/12/2014   Lupus anticoagulant positive 07/03/2014   Vitamin  D deficiency 06/06/2014    ONSET DATE: 01/31/2024 (Date of referral)   REFERRING DIAG: I63.81 (ICD-10-CM) - Thalamic stroke (HCC) G81.91 (ICD-10-CM) - Right hemiparesis (HCC) R29.818 (ICD-10-CM) - Hemisensory deficit  THERAPY DIAG:  Hemiplegia and hemiparesis following cerebral infarction affecting right dominant side (HCC)  Muscle weakness (generalized)  Other lack of coordination  Other symptoms and signs involving the musculoskeletal system  Rationale for Evaluation and Treatment: Rehabilitation  SUBJECTIVE:   SUBJECTIVE STATEMENT: Pain 8/10 entire Rt side  Pt accompanied by: self  PERTINENT  HISTORY: PMH: HTN, anxiety, depression, fibromyalgia, herpes zoster, DM2, statin intolerance, thalamic CVA, CKD   PRECAUTIONS: Fall  WEIGHT BEARING RESTRICTIONS: No  PAIN:  Are you having pain? Yes: NPRS scale: 8-7/10 Pain location: R side of body (more severe from elbow to shoulder) Pain description: buzzing Aggravating factors: movement Relieving factors: rest  FALLS: Has patient fallen in last 6 months? 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  HAND DOMINANCE: Right  ADLs: Eating: setup to cut food Grooming: mod I  UB Dressing: min A to fasten bra LB Dressing: mod I  Toileting: mod I Bathing: mod I Tub Shower transfers: mod I Equipment: Grab bars and handheld shower head  IADLs: Shopping: dependent Light housekeeping: dependent Meal Prep: mod I for light meal prep; dependent for larger meals, which she would have cooked before her stroke Community mobility: dependent Medication management: mod I Financial management: mod I Handwriting: 25% legible and Increased time  MOBILITY STATUS: mod I  ACTIVITY TOLERANCE: Activity tolerance: fair  FUNCTIONAL OUTCOME MEASURES: PSFS: 2.7 total score  Total score = sum of the activity scores/number of activities Minimum detectable change (90%CI) for average score = 2 points Minimum detectable change (90%CI) for single activity score = 3 points  UPPER EXTREMITY ROM:    BUE: WFL though bradykinesia with RUE and impaired opposition  UPPER EXTREMITY MMT:   Poor endurance with RUE testing  MMT Right (eval) Left (eval)  Shoulder flexion 3/5 WNL  Shoulder abduction 3/5 WNL   Elbow flexion 4-/5 WNL  Elbow extension 3/5 WNL  (Blank rows = not tested)  HAND FUNCTION: Grip strength: Right: 25.3 lbs; Left: 49.6 lbs  COORDINATION: 9 Hole Peg test: Right: 37 sec; Left: 24 sec  SENSATION: Light touch: Impaired   EDEMA: none reported or observed  MUSCLE TONE: WFL  COGNITION: Overall cognitive status: Within functional limits for tasks assessed  VISION: Subjective report: no changes  VISION ASSESSMENT: WFL  PERCEPTION: Not tested  PRAXIS: Not tested  OBSERVATIONS: Pt appears well-kept.  TREATMENT :    Reviewed coordination HEP and putty HEP - pt had no further questions.   Pt reports spasms and altered sensation Rt side (d/t thalamic stroke) - explained why her symptoms are like this and role of the thalamus in the brain.  Issued and reviewed desensitization techniques d/t some hypersensitivity RUE, and also discussed and issued education on sensory loss and safety considerations w/ altered sensation (see pt instructions for details) Pt shown A/E to increase safety with meal prep/cooking including cut resistant gloves, veggie chopper, silicone oven mitts, and oven rack pull  Pt shown how to play golf solitaire for RUE coordination, repetitive forced use of RUE, visual scanning and increasing problem solving, planning ahead, and strategy skills. Pt picked up on this quick w/ few reminders required by 2nd trial  PATIENT EDUCATION: Education details: see above Person educated: Patient Education method: Explanation, Demonstration, Tactile cues, Verbal cues, and Handouts Education comprehension: verbalized understanding, returned demonstration, verbal cues required, and needs further education  HOME EXERCISE PROGRAM: 03/06/2024 putty and coordination HEPs 03/13/24: altered sensory loss education/safety, desensitization  techniques  GOALS:  SHORT TERM GOALS: Target date: 03/31/2024    Patient will demonstrate initial R UE HEP with 25% verbal cues or less for proper execution. Baseline: Goal status: IN PROGRESS  2.  Pt will independently recall the 5 main sensory precautions (cold, heat, sharp, chemical, and heavy) as needed to prevent injury/harm secondary to impairments.   Baseline:  Goal status: IN PROGRESS   LONG TERM GOALS: Target date: 05/05/2024  Patient will demonstrate updated R UE HEP with visual handouts only for proper execution. Baseline:  Goal status: INITIAL  2.  Patient will demonstrate at least 35 lbs R grip strength as needed to open jars and other containers. Baseline: Right: 25.3 lbs Goal status: INITIAL  3.  Patient will demo improved FM coordination as evidenced by completing nine-hole peg with use of R in 30 seconds or less.  Baseline:  Right: 37 sec Goal status: INITIAL  4.  Patient will report at least two-point increase in average PSFS score or at least three-point increase in a single activity score indicating functionally significant improvement given minimum detectable change.  Baseline: 2.7 total score (See above for individual activity scores) Goal status: INITIAL  ASSESSMENT:  CLINICAL IMPRESSION: Patient demonstrates good understanding of HEP this visit. May require modifications given sensory involvement. Pt progressing towards STGs. Pt would continue to benefit from skilled O.T. to address below deficits and return pt to PLOF as able.   PERFORMANCE DEFICITS: in functional skills including ADLs, IADLs, coordination, sensation, strength, pain, Fine motor control, Gross motor control, and UE functional use.   IMPAIRMENTS: are limiting patient from ADLs, IADLs, rest and sleep, work, and social participation.   CO-MORBIDITIES: may have co-morbidities  that affects occupational performance. Patient will benefit from skilled OT to address above impairments and  improve overall function.  REHAB POTENTIAL: Good  PLAN:  OT FREQUENCY: 2x/week  OT DURATION: 8 weeks  PLANNED INTERVENTIONS: 97168 OT Re-evaluation, 97535 self care/ADL training, 16109 therapeutic exercise, 97530 therapeutic activity, 97112 neuromuscular re-education, 97140 manual therapy, 97035 ultrasound, 97018 paraffin, 60454 fluidotherapy, 97010 moist heat, 97032 electrical stimulation (manual), 97760 Orthotic Initial, 97763 Orthotic/Prosthetic subsequent, energy conservation, coping strategies training, patient/family education, and DME and/or AE instructions  RECOMMENDED OTHER SERVICES: N/A for this visit  CONSULTED AND AGREED WITH PLAN OF CARE: Patient  PLAN FOR NEXT SESSION: consider continuous US  to Rt upper traps followed by K-taping to upper  trap and possibly bicep RUE to relax both muscles as pt is guarding and spasming   Velinda Getting, OT 03/13/2024, 1:24 PM

## 2024-03-15 ENCOUNTER — Encounter: Payer: Self-pay | Admitting: "Endocrinology

## 2024-03-15 ENCOUNTER — Ambulatory Visit: Admitting: Occupational Therapy

## 2024-03-15 ENCOUNTER — Other Ambulatory Visit: Payer: Self-pay | Admitting: Family Medicine

## 2024-03-15 ENCOUNTER — Ambulatory Visit: Admitting: Physical Therapy

## 2024-03-15 ENCOUNTER — Encounter: Payer: Self-pay | Admitting: Family Medicine

## 2024-03-15 DIAGNOSIS — I639 Cerebral infarction, unspecified: Secondary | ICD-10-CM

## 2024-03-15 DIAGNOSIS — M5416 Radiculopathy, lumbar region: Secondary | ICD-10-CM

## 2024-03-15 MED ORDER — FREESTYLE LIBRE 3 PLUS SENSOR MISC: 1.0000 | 3 refills | Status: AC

## 2024-03-16 ENCOUNTER — Encounter (HOSPITAL_BASED_OUTPATIENT_CLINIC_OR_DEPARTMENT_OTHER): Payer: Self-pay | Admitting: Cardiovascular Disease

## 2024-03-16 ENCOUNTER — Ambulatory Visit: Admitting: Obstetrics and Gynecology

## 2024-03-16 DIAGNOSIS — I10 Essential (primary) hypertension: Secondary | ICD-10-CM

## 2024-03-16 DIAGNOSIS — Z5181 Encounter for therapeutic drug level monitoring: Secondary | ICD-10-CM

## 2024-03-16 MED ORDER — INDAPAMIDE 1.25 MG PO TABS: 1.2500 mg | ORAL_TABLET | Freq: Every day | ORAL | 3 refills | Status: DC

## 2024-03-22 ENCOUNTER — Encounter: Payer: Self-pay | Admitting: Pharmacist Clinician (PhC)/ Clinical Pharmacy Specialist

## 2024-03-22 ENCOUNTER — Ambulatory Visit: Admitting: Physical Therapy

## 2024-03-22 ENCOUNTER — Encounter: Payer: Self-pay | Admitting: Physical Therapy

## 2024-03-22 ENCOUNTER — Ambulatory Visit: Admitting: Occupational Therapy

## 2024-03-22 DIAGNOSIS — R278 Other lack of coordination: Secondary | ICD-10-CM | POA: Diagnosis not present

## 2024-03-22 DIAGNOSIS — R29818 Other symptoms and signs involving the nervous system: Secondary | ICD-10-CM

## 2024-03-22 DIAGNOSIS — R208 Other disturbances of skin sensation: Secondary | ICD-10-CM | POA: Diagnosis not present

## 2024-03-22 DIAGNOSIS — M6281 Muscle weakness (generalized): Secondary | ICD-10-CM

## 2024-03-22 DIAGNOSIS — R29898 Other symptoms and signs involving the musculoskeletal system: Secondary | ICD-10-CM | POA: Diagnosis not present

## 2024-03-22 DIAGNOSIS — I69351 Hemiplegia and hemiparesis following cerebral infarction affecting right dominant side: Secondary | ICD-10-CM | POA: Diagnosis not present

## 2024-03-22 DIAGNOSIS — R2689 Other abnormalities of gait and mobility: Secondary | ICD-10-CM

## 2024-03-22 DIAGNOSIS — R2681 Unsteadiness on feet: Secondary | ICD-10-CM | POA: Diagnosis not present

## 2024-03-22 NOTE — Therapy (Signed)
 OUTPATIENT PHYSICAL THERAPY NEURO TREATMENT - RECERTIFICATION   Patient Name: Amber Rose MRN: [id number] DOB:22-Aug-1976, 48 y.o., female Today's Date: 03/22/2024   PCP: Joaquin Mulberry, MD REFERRING PROVIDER: Hassie Lint, PA-C  END OF SESSION:  PT End of Session - 03/22/24 1109     Visit Number 8    Number of Visits 17   9 + 8   Date for PT Re-Evaluation 05/05/24   pushed out due to future multi-disciplinary and aquatic needs   Authorization Type [employer] dual complete    Progress Note Due on Visit 10    PT Start Time 1105    PT Stop Time 1154    PT Time Calculation (min) 49 min    Equipment Utilized During Treatment Gait belt    Activity Tolerance Patient tolerated treatment well    Behavior During Therapy WFL for tasks assessed/performed             Past Medical History:  Diagnosis Date   Abnormal Pap smear of cervix    yrs ago   Adenomyosis    Allergy     Anemia    Anxiety    Aortic atherosclerosis (HCC)    Arthritis    Asthma    Chronic UTI    Clostridium difficile infection    Depression    Diabetes mellitus without complication (HCC) 10/22/2023   A1C 8.8   Diverticulosis    Endometriosis    Fibroid    Fibromyalgia    GERD (gastroesophageal reflux disease)    Hiatal hernia    Hyperlipidemia    Hypertension    Internal hemorrhoids    Meningitis, viral    Migraines    Pancreatitis    Pure hypercholesterolemia 12/23/2020   Tubular adenoma of colon    Past Surgical History:  Procedure Laterality Date   APPENDECTOMY  1990   CERVICAL BIOPSY  W/ LOOP ELECTRODE EXCISION     LEEP   CESAREAN SECTION  2002   CHOLECYSTECTOMY  2011   COLONOSCOPY     UPPER GASTROINTESTINAL ENDOSCOPY     URINARY SURGERY     urethra sling, removal, and then revision 2016 (4 surgeries)   WISDOM TOOTH EXTRACTION     Patient Active Problem List   Diagnosis Date Noted   CVA (cerebral vascular accident) (HCC) 01/08/2024   Leukocytosis 01/08/2024   Type 2  diabetes mellitus with chronic kidney disease, with long-term current use of insulin  (HCC) 01/08/2024   Hyperlipidemia 01/08/2024   GERD (gastroesophageal reflux disease) 01/08/2024   Overweight (BMI 25.0-29.9) 01/08/2024   Moderately severe major depression (HCC) 01/06/2024   Yeast vaginitis 08/30/2023   Iron  deficiency anemia 03/11/2023   PMDD (premenstrual dysphoric disorder) 11/17/2022   Abnormal uterine bleeding (AUB) 11/17/2022   Statin myopathy 09/23/2022   Steatorrhea 08/17/2022   Irregular menses 11/28/2021   Perimenopause 11/28/2021   Episode of recurrent major depressive disorder (HCC) 04/09/2021   BMI 33.0-33.9,adult 04/09/2021   Pure hypercholesterolemia 12/23/2020   Menorrhagia with regular cycle 09/13/2020   Well woman exam 09/13/2020   History of herpes zoster 01/17/2018   Overactive bladder 12/28/2017   Recurrent infections 11/19/2017   Cyst of nasal cavity 11/19/2017   Angio-edema 11/19/2017   Abdominal pain, epigastric 11/04/2017   Dysphagia 11/04/2017   Gastroesophageal reflux disease 07/12/2017   Degenerative disc disease at L5-S1 level 05/14/2017   Fibromyalgia 05/14/2017   ADD (attention deficit disorder) 05/14/2017   Urinary incontinence 04/05/2017   Thigh pain 02/02/2017   Anxiety and depression  02/02/2017   Tobacco abuse 02/02/2017   Facial flushing 02/02/2017   Hypertension 10/23/2016   Hidradenitis 10/23/2016   Diabetes (HCC) 09/12/2014   Lupus anticoagulant positive 07/03/2014   Vitamin D  deficiency 06/06/2014    ONSET DATE: 01/07/2024 (Lacunar CVA)  REFERRING DIAG: G81.91 (ICD-10-CM) - Right hemiparesis (HCC) I63.9 (ICD-10-CM) - Cerebrovascular accident (CVA), unspecified mechanism (HCC)  THERAPY DIAG:  Hemiplegia and hemiparesis following cerebral infarction affecting right dominant side (HCC)  Muscle weakness (generalized)  Other lack of coordination  Other symptoms and signs involving the musculoskeletal system  Other symptoms and  signs involving the nervous system  Other disturbances of skin sensation  Other abnormalities of gait and mobility  Unsteadiness on feet  Rationale for Evaluation and Treatment: Rehabilitation  SUBJECTIVE:                                                                                                                                                                                             SUBJECTIVE STATEMENT: Pt reports they took her off a BP med due to kidney issues.  She is unsure which medicine.  She continues to have severe tingling in RUE.  She inquires about aquatic therapy.  She states she thinks this particular BP medicine created bilateral LE and UE cramps that caused her to fall during her daily walk about 2 weeks ago.  She continues to walk daily, but only in her driveway in the event she were to fall again.  She was not injured but this scared her.  It took about 15 minutes for the cramping to stop, the second set took about 30 minutes to resolve, but she did not fall with the second bout of cramps when going up the stairs. Pt accompanied by: self (drove herself)  PERTINENT HISTORY: HTN, anxiety, depression, fibromyalgia, herpes zoster, DM2, statin intolerance, thalamic CVA, CKD  PAIN:  Are you having pain? Yes: NPRS scale: 7 Pain location: right arm Pain description: tingling/cramping Aggravating factors: using the arm, laying on the right Relieving factors: sitting still  PRECAUTIONS: Fall  RED FLAGS: Bowel or bladder incontinence: Yes: urinary incontinence - wearing depends today and MD aware per report   WEIGHT BEARING RESTRICTIONS: No  FALLS: Has patient fallen in last 6 months? No  LIVING ENVIRONMENT: Lives with: lives with an adult companion Lives in: House/apartment Stairs: Yes: External: 3 steps; none Has following equipment at home: Grab bars  PLOF: Independent with gait, Independent with transfers, Needs assistance with ADLs, and Needs assistance with  homemaking  PATIENT GOALS: "Everything is really up top so I don't really know about PT."  OBJECTIVE:  Note:  Objective measures were completed at Evaluation unless otherwise noted.  DIAGNOSTIC FINDINGS:  MR Brain 01/08/2024: IMPRESSION: Acute lacunar infarct in the left thalamus.  COGNITION: Overall cognitive status: pt reports some difficulty with memory/losing her place with tasks due to focus   SENSATION: Light touch: WFL - pt reports decreased R > L  COORDINATION: LE RAMS;  impaired Heel-to-shin:  WNL bilaterally  EDEMA:  WNL  MUSCLE TONE: Difficulty discerning flexion spasticity due to pt difficulty relaxing  POSTURE: rounded shoulders and forward head  LOWER EXTREMITY ROM:     Active  Right Eval Left Eval  Hip flexion WNL WNL  Hip extension    Hip abduction    Hip adduction    Hip internal rotation    Hip external rotation    Knee flexion " "  Knee extension " "  Ankle dorsiflexion " "  Ankle plantarflexion    Ankle inversion    Ankle eversion     (Blank rows = not tested)  LOWER EXTREMITY MMT:    MMT Right Eval Left Eval  Hip flexion 4-/5 4+/5  Hip extension    Hip abduction    Hip adduction    Hip internal rotation    Hip external rotation    Knee flexion 4-/5 4+/5  Knee extension 4/5 5/5  Ankle dorsiflexion 4+/5 5/5  Ankle plantarflexion    Ankle inversion    Ankle eversion    (Blank rows = not tested)  BED MOBILITY:  Pt sleeps in standard bed and requires increased time and momentum to get roll and has difficulty managing bedding  TRANSFERS: Assistive device utilized: None  Sit to stand: SBA Stand to sit: SBA Chair to chair: SBA  GAIT: Gait pattern: bilateral ER, step through pattern, decreased stride length, decreased hip/knee flexion- Right, decreased hip/knee flexion- Left, decreased ankle dorsiflexion- Right, decreased ankle dorsiflexion- Left, trendelenburg, and wide BOS Distance walked: various clinic distances Assistive  device utilized: None Level of assistance: SBA Comments: Pt has severe drift from pathway to left then right when entering clinic, increased time to correct path with cuing.  Truncal ataxia?  FUNCTIONAL TESTS:  5 times sit to stand: 15.13 sec w/ light BUE support, severe hyperextension in standing, mild weight shift left during sitting Timed up and go (TUG): 11.28 sec SBA, no instability in turning with small radius 10 meter walk test: 8.50 sec = 1.18 m/sec OR 3.88 ft/sec Functional gait assessment: To be assessed.  PATIENT SURVEYS:  None completed due to time.                                                                                                                              TREATMENT DATE: 03/22/2024  -Verbally reviewed HEP and pt has current printout.  Discussed nerve symptoms and ongoing desensitization.  Discussed sensory challenges post-CVA and gentle exposure to light activities that might create "nerve noise". -Discussed her ongoing glucose management challenges and she is seeing  a dietician to help with snack management for swings. -Discussed her concerns about walking and her cramping - deferred specific medication mechanism questions to PCP.  Encouraged general nutrition, hydration and protein intake for nerve healing and general health. -IND ramp/curb/500 ft over unlevel sidewalk and grass w/ only mild lateral wobble w/ transitions - : 9.79 sec IND = 1.02 m/sec OR 3.37 ft/sec  -FGA:  [facility] PT Assessment - 03/22/24 1137       Functional Gait  Assessment   Gait assessed  Yes    Gait Level Surface Walks 20 ft in less than 5.5 sec, no assistive devices, good speed, no evidence for imbalance, normal gait pattern, deviates no more than 6 in outside of the 12 in walkway width.    Change in Gait Speed Able to change speed, demonstrates mild gait deviations, deviates 6-10 in outside of the 12 in walkway width, or no gait deviations, unable to achieve a major change in  velocity, or uses a change in velocity, or uses an assistive device.    Gait with Horizontal Head Turns Performs head turns smoothly with no change in gait. Deviates no more than 6 in outside 12 in walkway width    Gait with Vertical Head Turns Performs head turns with no change in gait. Deviates no more than 6 in outside 12 in walkway width.    Gait and Pivot Turn Pivot turns safely in greater than 3 sec and stops with no loss of balance, or pivot turns safely within 3 sec and stops with mild imbalance, requires small steps to catch balance.    Step Over Obstacle Is able to step over 2 stacked shoe boxes taped together (9 in total height) without changing gait speed. No evidence of imbalance.    Gait with Narrow Base of Support Is able to ambulate for 10 steps heel to toe with no staggering.    Gait with Eyes Closed Walks 20 ft, uses assistive device, slower speed, mild gait deviations, deviates 6-10 in outside 12 in walkway width. Ambulates 20 ft in less than 9 sec but greater than 7 sec.    Ambulating Backwards Walks 20 ft, uses assistive device, slower speed, mild gait deviations, deviates 6-10 in outside 12 in walkway width.   pt catches heels of shoes on each other x2 at onset of task, no severe LOB   Steps Alternating feet, must use rail.    Total Score 25    FGA comment: 25/30 = low fall risk            PATIENT EDUCATION: Education details:  BP monitoring at home/logging if able.  Continue HEP if BP allows - reminder of modifying based on safe limits.  Discussed current HEP - she endorses it remains moderately challenging especially keeping up with other discipline HEPs.  See above for further.  Process for re-cert today and addition of aquatics.  Aquatics expectations, directions, and community pool list - discussed possible aquatic HEP and pt has access to pool and is interested in this.  Gait mechanics as they relate to sensory changes and fatigue/time of day. Person educated:  Patient Education method: Explanation Education comprehension: verbalized understanding and needs further education  HOME EXERCISE PROGRAM: Access Code: [email or web address] URL: [email or web address] Date: 02/04/2024 Prepared by: Marilou Showman  Exercises - Sit to Stand with Armchair  - 1 x daily - 7 x weekly - 3 sets - 10 reps - Corner Balance Feet Together With Eyes Open  - 1 x daily -  5 x weekly - 1 sets - 3-4 reps - 30 seconds hold - Corner Balance Feet Together With Eyes Closed  - 1 x daily - 5 x weekly - 1 sets - 3-4 reps - 30 seconds hold - Corner Balance Feet Together: Eyes Open With Head Turns  - 1 x daily - 5 x weekly - 1 sets - 3-4 reps - 30 seconds hold - Romberg Stance with Head Nods  - 1 x daily - 5 x weekly - 2-3 sets - 10 reps - Tandem Walking with Counter Support  - 1 x daily - 5 x weekly - 3 sets - 10 reps  Chair yoga poses reviewed 5/7  GOALS: Goals reviewed with patient? Yes  SHORT TERM GOALS: Target date: 02/25/2024  Pt will be independent and compliant with initial strength and balance focused HEP in order to maintain functional progress and improve mobility. Baseline:  IND and compliant (5/7) Goal status: MET  2.  Pt will decrease 5xSTS to </=12 seconds w/ improved upright neutral in order to demonstrate decreased risk for falls and improved functional bilateral LE strength and power. Baseline: 15.13 sec w/ light BUE support and severe lumbar hyperextension on upright; 15.21 sec w/o UE support (5/7) Goal status: IN PROGRESS  LONG TERM GOALS: Target date: 03/24/2024  Pt will be independent and compliant with advanced and finalized strength and balance focused HEP in order to maintain functional progress and improve mobility. Baseline: Pt compliant and IND (5/28) Goal status: MET  2.  Pt will demonstrate a gait speed of >/=4.08 feet/sec in order to decrease risk for falls. Baseline: 3.88 ft/sec; 3.37 ft/sec (5/28) Goal status: NOT MET  3.  Pt will  ambulate >/=500 feet with LRAD at no more than mod I level of assist over unlevel/compliant surfaces, curb step and ramp with improved trunk stability to promote household and community access. Baseline: instability over level ground/SBA; IND ramp/curb/500 ft over unlevel sidewalk and grass w/ only mild lateral wobble w/ transitions (5/28) Goal status: MET  4.  Pt will improve FGA score to >/=19/30 in order to demonstrate improved balance and decreased fall risk. Baseline: 13/30 (4/11); 25/30 (5/28) Goal status: MET  ASSESSMENT:  CLINICAL IMPRESSION: Assessed LTGs in preparation for re-cert today.  She met 3 of 4 goals as written.  Her gait speed declined this visit compared to last, but this could possibly be due to end of session fatigue following other more involved assessments.  Overall her balance and fall risk are much improved compared to initial evaluation.  She would likely benefit from aquatic therapy to further address imbalance and general activity tolerance.  Will continue per POC.  OBJECTIVE IMPAIRMENTS: Abnormal gait, decreased activity tolerance, decreased balance, decreased coordination, decreased knowledge of condition, decreased knowledge of use of DME, decreased strength, increased muscle spasms, impaired sensation, improper body mechanics, postural dysfunction, and pain.   ACTIVITY LIMITATIONS: carrying, lifting, bending, standing, squatting, stairs, transfers, bed mobility, continence, bathing, dressing, self feeding, reach over head, and locomotion level  PARTICIPATION LIMITATIONS: meal prep, cleaning, laundry, driving, community activity, and occupation  PERSONAL FACTORS: Fitness, Past/current experiences, Transportation, and 3+ comorbidities: HTN, fibromyalgia, DM2 are also affecting patient's functional outcome.   REHAB POTENTIAL: Good  CLINICAL DECISION MAKING: Evolving/moderate complexity  EVALUATION COMPLEXITY: Moderate  PLAN:  PT FREQUENCY: 1x/week + 2x/wk  (1x in aquatics and 1x on land)  PT DURATION: 8 weeks + 4 wks  PLANNED INTERVENTIONS: [id number]- PT Re-evaluation, 97750- Physical Performance Testing, 97110-Therapeutic exercises, 97530-  Therapeutic activity, [id number]- Neuromuscular re-education, [phone]- Self Care, 69629- Manual therapy, [phone]- Gait training, [phone]- Orthotic Initial, [phone]- Aquatic Therapy, [phone]- Electrical stimulation (manual), Patient/Family education, Balance training, Stair training, Taping, Dry Needling, Joint mobilization, Spinal mobilization, Vestibular training, DME instructions, Cryotherapy, and Moist heat  PLAN FOR NEXT SESSION: Expand HEP - may benefit from addition of dynamic balance (monster walks, backwards marching, squatting) - REVIEW IN FUTURE SESSION.  Gait training.  weighted vest w/ hurdles/dynamic stability tasks.  RLE NMR.  Blaze pods.  Review birddogs.  Physioball core and seated stability? R shoulder tightness.  AQUATICS:  Plan for aquatic HEP, UE mobility/sensation and pain management, LE stretching and strengthening, general balance  Earlean Glaze, PT, DPT 03/22/2024, 11:55 AM

## 2024-03-22 NOTE — Therapy (Unsigned)
 OUTPATIENT OCCUPATIONAL THERAPY NEURO TREATMENT  Patient Name: Amber Rose MRN: [id number] DOB:[birth year], 48 y.o., female Today's Date: 03/22/2024  PCP: Joaquin Mulberry, MD  REFERRING PROVIDER: Johny Nap, NP  END OF SESSION:  OT End of Session - 03/22/24 0935     Visit Number 4    Number of Visits 17    Date for OT Re-Evaluation 05/05/24    Authorization Type U[employer] Medicare primary; [place] Medicaid secondary    OT Start Time [phone]    OT Stop Time 1015    OT Time Calculation (min) 39 min    Activity Tolerance Patient tolerated treatment well    Behavior During Therapy WFL for tasks assessed/performed             Past Medical History:  Diagnosis Date   Abnormal Pap smear of cervix    yrs ago   Adenomyosis    Allergy     Anemia    Anxiety    Aortic atherosclerosis (HCC)    Arthritis    Asthma    Chronic UTI    Clostridium difficile infection    Depression    Diabetes mellitus without complication (HCC) 10/22/2023   A1C 8.8   Diverticulosis    Endometriosis    Fibroid    Fibromyalgia    GERD (gastroesophageal reflux disease)    Hiatal hernia    Hyperlipidemia    Hypertension    Internal hemorrhoids    Meningitis, viral    Migraines    Pancreatitis    Pure hypercholesterolemia 12/23/2020   Tubular adenoma of colon    Past Surgical History:  Procedure Laterality Date   APPENDECTOMY  1990   CERVICAL BIOPSY  W/ LOOP ELECTRODE EXCISION     LEEP   CESAREAN SECTION  2002   CHOLECYSTECTOMY  2011   COLONOSCOPY     UPPER GASTROINTESTINAL ENDOSCOPY     URINARY SURGERY     urethra sling, removal, and then revision 2016 (4 surgeries)   WISDOM TOOTH EXTRACTION     Patient Active Problem List   Diagnosis Date Noted   CVA (cerebral vascular accident) (HCC) 01/08/2024   Leukocytosis 01/08/2024   Type 2 diabetes mellitus with chronic kidney disease, with long-term current use of insulin  (HCC) 01/08/2024   Hyperlipidemia 01/08/2024   GERD (gastroesophageal  reflux disease) 01/08/2024   Overweight (BMI 25.0-29.9) 01/08/2024   Moderately severe major depression (HCC) 01/06/2024   Yeast vaginitis 08/30/2023   Iron  deficiency anemia 03/11/2023   PMDD (premenstrual dysphoric disorder) 11/17/2022   Abnormal uterine bleeding (AUB) 11/17/2022   Statin myopathy 09/23/2022   Steatorrhea 08/17/2022   Irregular menses 11/28/2021   Perimenopause 11/28/2021   Episode of recurrent major depressive disorder (HCC) 04/09/2021   BMI 33.0-33.9,adult 04/09/2021   Pure hypercholesterolemia 12/23/2020   Menorrhagia with regular cycle 09/13/2020   Well woman exam 09/13/2020   History of herpes zoster 01/17/2018   Overactive bladder 12/28/2017   Recurrent infections 11/19/2017   Cyst of nasal cavity 11/19/2017   Angio-edema 11/19/2017   Abdominal pain, epigastric 11/04/2017   Dysphagia 11/04/2017   Gastroesophageal reflux disease 07/12/2017   Degenerative disc disease at L5-S1 level 05/14/2017   Fibromyalgia 05/14/2017   ADD (attention deficit disorder) 05/14/2017   Urinary incontinence 04/05/2017   Thigh pain 02/02/2017   Anxiety and depression 02/02/2017   Tobacco abuse 02/02/2017   Facial flushing 02/02/2017   Hypertension 10/23/2016   Hidradenitis 10/23/2016   Diabetes (HCC) 09/12/2014   Lupus anticoagulant positive 07/03/2014   Vitamin  D deficiency 06/06/2014    ONSET DATE: 01/31/2024 (Date of referral)   REFERRING DIAG: I63.81 (ICD-10-CM) - Thalamic stroke (HCC) G81.91 (ICD-10-CM) - Right hemiparesis (HCC) R29.818 (ICD-10-CM) - Hemisensory deficit  THERAPY DIAG:  Muscle weakness (generalized)  Other lack of coordination  Other disturbances of skin sensation  Other symptoms and signs involving the nervous system  Rationale for Evaluation and Treatment: Rehabilitation  SUBJECTIVE:   SUBJECTIVE STATEMENT: Pt reports her pain is a 8/10 all the time ie) muscle spasms etc 24/7, somedays more agitated - doing dishes is really hard -  twisting and turning of wrist makes things worse.  Pt accompanied by: self  PERTINENT HISTORY: PMH: HTN, anxiety, depression, fibromyalgia, herpes zoster, DM2, statin intolerance, thalamic CVA, CKD   PRECAUTIONS: Fall  WEIGHT BEARING RESTRICTIONS: No  PAIN:  Are you having pain? Yes: NPRS scale: 8-7/10 Pain location: R side of body (more severe from elbow to shoulder) Pain description: buzzing Aggravating factors: movement Relieving factors: rest  FALLS: Has patient fallen in last 6 months? 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  HAND DOMINANCE: Right  ADLs: Eating: setup to cut food Grooming: mod I  UB Dressing: min A to fasten bra LB Dressing: mod I  Toileting: mod I Bathing: mod I Tub Shower transfers: mod I Equipment: Grab bars and handheld shower head  IADLs: Shopping: dependent Light housekeeping: dependent Meal Prep: mod I for light meal prep; dependent for larger meals, which she would have cooked before her stroke Community mobility: dependent Medication management: mod I Financial management: mod I Handwriting: 25% legible and Increased time  MOBILITY STATUS: mod I  ACTIVITY TOLERANCE: Activity tolerance: fair  FUNCTIONAL OUTCOME MEASURES: PSFS: 2.7 total score  Total score = sum of the activity scores/number of activities Minimum detectable change (90%CI) for average score = 2 points Minimum detectable change (90%CI) for single activity score = 3 points  UPPER EXTREMITY ROM:    BUE: WFL though bradykinesia with RUE and impaired opposition  UPPER EXTREMITY MMT:   Poor endurance with RUE  testing  MMT Right (eval) Left (eval)  Shoulder flexion 3/5 WNL  Shoulder abduction 3/5 WNL  Elbow flexion 4-/5 WNL  Elbow extension 3/5 WNL  (Blank rows = not tested)  HAND FUNCTION: Grip strength: Right: 25.3 lbs; Left: 49.6 lbs  COORDINATION: 9 Hole Peg test: Right: 37 sec; Left: 24 sec  SENSATION: Light touch: Impaired   EDEMA: none reported or observed  MUSCLE TONE: WFL  COGNITION: Overall cognitive status: Within functional limits for tasks assessed  VISION: Subjective report: no changes  VISION ASSESSMENT: WFL  PERCEPTION: Not tested  PRAXIS: Not tested  OBSERVATIONS: Pt appears well-kept.  TODAY'S TREATMENT :    Reviewed desensitization techniques extreme hypersensitivity and discomfort of RUE, and also discussed and issued education on sensory loss and safety considerations w/ altered sensation   Trialled deep touch, massage and vibration during functional UE tasks with some good success interrupting the negative sensations she was having upon arrival.  Pt able to complete gross grasp to pick up colored blocks and place into bowl with 20 pound hand grip for strengthening of affected extremity during tactile input from OTR to diminish discomfort pt reports with any movement in RUE.    PATIENT EDUCATION: Education details: desensitization Person educated: Patient Education method: Programmer, multimedia, Demonstration, Tactile cues, Verbal cues, and Handouts Education comprehension: verbalized understanding, returned demonstration, verbal cues required, and needs further education  HOME EXERCISE PROGRAM: 03/06/2024 putty and coordination HEPs 03/13/24: altered sensory loss education/safety, desensitization techniques  GOALS:  SHORT TERM GOALS: Target date: 03/31/2024    Patient will demonstrate initial R UE HEP with 25% verbal cues or less for  proper execution. Baseline: Goal status: IN PROGRESS  2.  Pt will independently recall the 5 main sensory precautions (cold, heat, sharp, chemical, and heavy) as needed to prevent injury/harm secondary to impairments.   Baseline:  Goal status: IN PROGRESS    LONG TERM GOALS: Target date: 05/05/2024  Patient will demonstrate updated R UE HEP with visual handouts only for proper execution. Baseline:  Goal status: IN Progress  2.  Patient will demonstrate at least 35 lbs R grip strength as needed to open jars and other containers. Baseline: Right: 25.3 lbs Goal status: IN Progress  3.  Patient will demo improved FM coordination as evidenced by completing nine-hole peg with use of R in 30 seconds or less.  Baseline:  Right: 37 sec Goal status: IN Progress  4.  Patient will report at least two-point increase in average PSFS score or at least three-point increase in a single activity score indicating functionally significant improvement given minimum detectable change.  Baseline: 2.7 total score (See above for individual activity scores) Goal status: INITIAL  ASSESSMENT:  CLINICAL IMPRESSION: Patient is a 48 y.o. female who was seen today for occupational therapy treatment following CVA with increased sensory involvement.  Patient responded well to massage, deep touch and vibration as options to interrupt negative sensations in RUE and options located via online search for pt to consider. Patient may still require modifications with daily HEP and routines given sensory involvement. Pt will benefit from continued skilled OT services in the outpatient setting to work on impairments as noted at evaluation to help pt return to [facility] as able.   PERFORMANCE DEFICITS: in functional skills including ADLs, IADLs, coordination, sensation, strength, pain, Fine motor control, Gross motor control, and UE functional use.   IMPAIRMENTS: are limiting patient from ADLs, IADLs, rest and sleep, work, and  social participation.   CO-MORBIDITIES: may have co-morbidities  that affects occupational performance. Patient will benefit from skilled OT to address above impairments and improve overall function.  REHAB POTENTIAL: Good  PLAN:  OT FREQUENCY: 2x/week  OT DURATION: 8 weeks  PLANNED INTERVENTIONS: 97168 OT Re-evaluation, 97535 self care/ADL training, 29562 therapeutic exercise, 97530 therapeutic activity, 97112 neuromuscular re-education, 97140 manual therapy, 97035 ultrasound, 97018 paraffin, 13086 fluidotherapy, 97010 moist heat, 97032 electrical stimulation (manual), 97760 Orthotic Initial, [id number] Orthotic/Prosthetic subsequent, energy conservation, coping strategies training, patient/family education, and DME and/or AE instructions  RECOMMENDED OTHER SERVICES: N/A for this visit  CONSULTED AND AGREED WITH PLAN OF CARE: Patient  PLAN  FOR NEXT SESSION: consider continuous US  to Rt upper traps followed by K-taping to upper trap and possibly bicep RUE to relax both muscles as pt is guarding and spasming and desensitization   Zora Hires, OT 03/22/2024, 8:14 PM

## 2024-03-24 ENCOUNTER — Other Ambulatory Visit: Payer: Self-pay | Admitting: Family Medicine

## 2024-03-24 DIAGNOSIS — Z1231 Encounter for screening mammogram for malignant neoplasm of breast: Secondary | ICD-10-CM

## 2024-03-27 ENCOUNTER — Ambulatory Visit: Admitting: Occupational Therapy

## 2024-03-27 ENCOUNTER — Encounter: Payer: Self-pay | Admitting: Speech Pathology

## 2024-03-27 ENCOUNTER — Ambulatory Visit: Attending: Physician Assistant | Admitting: Speech Pathology

## 2024-03-27 DIAGNOSIS — R278 Other lack of coordination: Secondary | ICD-10-CM

## 2024-03-27 DIAGNOSIS — R41841 Cognitive communication deficit: Secondary | ICD-10-CM | POA: Diagnosis present

## 2024-03-27 DIAGNOSIS — I69351 Hemiplegia and hemiparesis following cerebral infarction affecting right dominant side: Secondary | ICD-10-CM | POA: Diagnosis not present

## 2024-03-27 DIAGNOSIS — R208 Other disturbances of skin sensation: Secondary | ICD-10-CM | POA: Insufficient documentation

## 2024-03-27 DIAGNOSIS — R29818 Other symptoms and signs involving the nervous system: Secondary | ICD-10-CM | POA: Diagnosis not present

## 2024-03-27 DIAGNOSIS — M6281 Muscle weakness (generalized): Secondary | ICD-10-CM | POA: Diagnosis not present

## 2024-03-27 DIAGNOSIS — R2689 Other abnormalities of gait and mobility: Secondary | ICD-10-CM | POA: Diagnosis not present

## 2024-03-27 DIAGNOSIS — R2681 Unsteadiness on feet: Secondary | ICD-10-CM | POA: Insufficient documentation

## 2024-03-27 DIAGNOSIS — R29898 Other symptoms and signs involving the musculoskeletal system: Secondary | ICD-10-CM | POA: Diagnosis not present

## 2024-03-27 NOTE — Therapy (Signed)
 OUTPATIENT SPEECH LANGUAGE PATHOLOGY TREATMENT   Patient Name: Amber Rose MRN: [id number] DOB:[birth year], 48 y.o., female Today's Date: 03/27/2024  PCP: Joaquin Mulberry MD REFERRING PROVIDER: Johny Nap, NP  END OF SESSION:  End of Session - 03/27/24 1319     Visit Number 2    Date for SLP Re-Evaluation 04/19/24    Authorization Type [employer]HC Medicare    SLP Start Time 1318    SLP Stop Time  1400    SLP Time Calculation (min) 42 min    Activity Tolerance Patient tolerated treatment well             Past Medical History:  Diagnosis Date   Abnormal Pap smear of cervix    yrs ago   Adenomyosis    Allergy     Anemia    Anxiety    Aortic atherosclerosis (HCC)    Arthritis    Asthma    Chronic UTI    Clostridium difficile infection    Depression    Diabetes mellitus without complication (HCC) 10/22/2023   A1C 8.8   Diverticulosis    Endometriosis    Fibroid    Fibromyalgia    GERD (gastroesophageal reflux disease)    Hiatal hernia    Hyperlipidemia    Hypertension    Internal hemorrhoids    Meningitis, viral    Migraines    Pancreatitis    Pure hypercholesterolemia 12/23/2020   Tubular adenoma of colon    Past Surgical History:  Procedure Laterality Date   APPENDECTOMY  1990   CERVICAL BIOPSY  W/ LOOP ELECTRODE EXCISION     LEEP   CESAREAN SECTION  2002   CHOLECYSTECTOMY  2011   COLONOSCOPY     UPPER GASTROINTESTINAL ENDOSCOPY     URINARY SURGERY     urethra sling, removal, and then revision 2016 (4 surgeries)   WISDOM TOOTH EXTRACTION     Patient Active Problem List   Diagnosis Date Noted   CVA (cerebral vascular accident) (HCC) 01/08/2024   Leukocytosis 01/08/2024   Type 2 diabetes mellitus with chronic kidney disease, with long-term current use of insulin  (HCC) 01/08/2024   Hyperlipidemia 01/08/2024   GERD (gastroesophageal reflux disease) 01/08/2024   Overweight (BMI 25.0-29.9) 01/08/2024   Moderately severe major depression (HCC)  01/06/2024   Yeast vaginitis 08/30/2023   Iron  deficiency anemia 03/11/2023   PMDD (premenstrual dysphoric disorder) 11/17/2022   Abnormal uterine bleeding (AUB) 11/17/2022   Statin myopathy 09/23/2022   Steatorrhea 08/17/2022   Irregular menses 11/28/2021   Perimenopause 11/28/2021   Episode of recurrent major depressive disorder (HCC) 04/09/2021   BMI 33.0-33.9,adult 04/09/2021   Pure hypercholesterolemia 12/23/2020   Menorrhagia with regular cycle 09/13/2020   Well woman exam 09/13/2020   History of herpes zoster 01/17/2018   Overactive bladder 12/28/2017   Recurrent infections 11/19/2017   Cyst of nasal cavity 11/19/2017   Angio-edema 11/19/2017   Abdominal pain, epigastric 11/04/2017   Dysphagia 11/04/2017   Gastroesophageal reflux disease 07/12/2017   Degenerative disc disease at L5-S1 level 05/14/2017   Fibromyalgia 05/14/2017   ADD (attention deficit disorder) 05/14/2017   Urinary incontinence 04/05/2017   Thigh pain 02/02/2017   Anxiety and depression 02/02/2017   Tobacco abuse 02/02/2017   Facial flushing 02/02/2017   Hypertension 10/23/2016   Hidradenitis 10/23/2016   Diabetes (HCC) 09/12/2014   Lupus anticoagulant positive 07/03/2014   Vitamin D  deficiency 06/06/2014    ONSET DATE: 01/31/2024 (referral date)   REFERRING DIAG: I63.81 (ICD-10-CM) - Thalamic stroke (HCC)  I69.319 (ICD-10-CM) - Cognitive deficit, post-stroke  THERAPY DIAG:  Cognitive communication deficit  Rationale for Evaluation and Treatment: Rehabilitation  SUBJECTIVE:   SUBJECTIVE STATEMENT: "a little (difficulty) but improved" re: cognitive changes Pt accompanied by: self  PERTINENT HISTORY: "[name] is a 48 y.o. female with history of DM2, GERD, HTN, HLD, and current tobacco smoking who presented to ED on 01/07/2024 with right face, arm, and leg numbness.  Stroke workup revealed left thalamic infarct secondary to small vessel disease with multiple uncontrolled stroke risk  factors.  CTA head/neck negative LVO, noted premature arthrosclerosis for age.  EF 60 to 65%.  LDL 175.  A1c 6.4.  Recommended DAPT for 3 weeks and aspirin  alone.  Reported significant intolerance to statin medications as well as Repatha , recommended consideration of Leqvio.  DM and HTN controlled on home regimen.  Current tobacco and THC use with cessation counseling provided.  Therapies recommended outpatient PT/OT.   Today, 01/31/2024, patient is being seen for initial hospital follow-up unaccompanied.  Reports residual right-sided numbness, weakness and cognitive difficulty. Reports some improvement of symptoms since discharge. Had initial PT evaluation recently and has f/u visit 4/11.  Interested in pursuing OT due to difficulty with right hand fine motor control and dexterity.  She occasionally feels muscle spasms in her right arm.  PCP started Robaxin  which helped some.  She is also on pregabalin  for neuropathy.  Reports mild short-term memory difficulties since her stroke, will easily lose her train of thought during a conversation and can have delayed recall.  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  She has been on SSI prestroke for fibromyalgia, mood disorder and incontinence post surgical procedure. She did drive yesterday short distance and felt she did okay. She is able to maintain ADLs and majority of IADLs independently.  Denies new stroke/TIA symptoms."  PAIN:  Are you having pain? Yes: NPRS scale: 8/10 Pain location: right side  Pain description: spasms, aching Aggravating factors: unknown Relieving factors: none  FALLS: Has patient fallen in last 6 months?  No  LIVING ENVIRONMENT: Lives with: lives with an adult companion Lives in: House/apartment  PLOF:  Level of assistance: Independent with ADLs, Independent with IADLs Employment: On  disability  PATIENT GOALS: maximize return to baseline   OBJECTIVE:  Note: Objective measures were completed at Evaluation unless otherwise noted.  COGNITION: Overall cognitive status: Impaired Areas of impairment:  Attention: Impaired: Sustained, Alternating, Divided Memory: Impaired: Immediate Short term Prospective Awareness: Impaired: Emergent and Anticipatory Executive function: Impaired: Slow processing Functional deficits: Endorsed some difficulty with immediate recall if pressure present, challenges with multi-tasking, forgetting items daily, and getting distracted while watching TV/movie.   COGNITIVE COMMUNICATION: Following directions: Follows multi-step commands with increased time  Auditory comprehension: WFL Functional communication: Impaired: endorsed occasional stutter, slower rate of speech, and rare word finding episodes  ORAL MOTOR EXAMINATION: Overall status: WFL  STANDARDIZED ASSESSMENTS: CLQT: Attention: WNL, Memory: WNL, Executive Function: WNL, Language: WNL, Visuospatial Skills: WNL, and Clock Drawing: Mild  PATIENT REPORTED OUTCOME MEASURES (PROM): Not completed d/t time constraints  TREATMENT DATE:   03/27/24: Halford Levels reports success with am pm meds, forgetting sometimes 4:00 meds. Today targeted compensatory strategy of setting a daily alarm on her phone to recall 4:00 meds. Instructed her to hit snooze instead of stop. In the car, she is having difficulty following GPS and driving, missing exits and turns - we reviewed strategy of eliminating distractions. She continues to forget why she went into a room. Trained in strategy of repeating to herself what she is going into the room for until she retrieves the item or completes the task. Targeted problem solving for financial management generating list of monthly bills, amounts, way its  paid and due date with occasional min questioning cues.   02/23/24: ST evaluation and POC complete. Educated patient on parts of CLQT subtests with errors, including clock drawing and story recall. Was previously unaware of errors made during assessment. Provided recommendation to aid recall of medication management, including setting alarms/timers/reminders to support recall given inconsistent administration reported. Pt verbalized agreement.     PATIENT EDUCATION: Education details: see above Person educated: Patient Education method: Explanation Education comprehension: verbalized understanding and needs further education   GOALS: Goals reviewed with patient? Yes  SHORT TERM GOALS: Target date: 03/22/2024  Pt will successfully recall medication for 2/2 opportunities with use of external memory support  Baseline: Goal status: ONGOING  2.  Pt will carryover memory compensations to aid recall at home for 2/2 opportunities (ex: locating items) Baseline:  Goal status: ONGOING  3.  Pt will successfully utilize attention strategies to maintain attention for extended periods (I.e., show/movie) for 2/2 opportunities Baseline:  Goal status: ONGOING   LONG TERM GOALS: Target date: 04/19/2024  Pt will successfully utilize cognitive compensations to manage iADLs with no errors reported >1 week  Baseline:  Goal status: ONGOING  2.  Pt will successfully complete 2-3 daily tasks with use of cognitive compensations > 1 week  Baseline:  Goal status: ONGOING  3.  Pt will maintain WNL communication x2 sessions given rare min A  Baseline:  Goal status: ONGOING  4. Pt will improve self-rating of cognitive functioning by at least 1 point by LTG date  Baseline: self-rated 7/10  Goal Status: ONGOING  ASSESSMENT:  CLINICAL IMPRESSION: Patient is a 48 y.o. F who was seen today for ST evaluation s/p  stroke in March 2025. Endorsed mild changes in memory multi-tasking, and concentration as well  occasional stutter. CLQT score was WNL but recall, attention, and executive function deficits evidenced impacting subtest scores (story retell, symbol trials, clock drawing, and design generation). Self-rated current cognitive functioning as 7/10 (10 being baseline). This aforementioned deficits have impacted her medication management, household tasks, and engagement in prior hobbies. Pt would benefit from skilled ST intervention to maximize return to baseline and overall QoL.   OBJECTIVE IMPAIRMENTS: include attention, memory, awareness, executive functioning, and expressive language. These impairments are limiting patient from household responsibilities and effectively communicating at home and in community.Factors affecting potential to achieve goals and functional outcome are previous level of function.. Patient will benefit from skilled SLP services to address above impairments and improve overall function.  REHAB POTENTIAL: Good  PLAN:  SLP FREQUENCY: 1x/week  SLP DURATION: 8 weeks  PLANNED INTERVENTIONS: Language facilitation, Environmental controls, Cueing hierachy, Cognitive reorganization, Functional tasks, SLP instruction and feedback, Compensatory strategies, Patient/family education, [phone] Treatment of speech (30 or 45 min) , and 40981- Speech Eval Sound Prod, Artic, Phon, Eval Compre, Express    Kynadee Dam, Dareen Ebbing, CCC-SLP 03/27/2024,  1:52 PM

## 2024-03-27 NOTE — Patient Instructions (Signed)
  Get the route and read it before you leave the house so you have some idea of the turns and exits    Repeat what you are going to get in the room (I"m getting my jacket, I'm getting my jacket, I'm getting my jacket) until you get what you need  When you set something down (keys, phone, wallet etc) say aloud what you are doing (I'm setting my cup on the mantle)

## 2024-03-27 NOTE — Therapy (Signed)
 OUTPATIENT OCCUPATIONAL THERAPY NEURO TREATMENT  Patient Name: Amber Rose MRN: [id number] DOB:10-06-1976, 48 y.o., female Today's Date: 03/27/2024  PCP: Joaquin Mulberry, MD  REFERRING PROVIDER: Johny Nap, NP  END OF SESSION:  OT End of Session - 03/27/24 1232     Visit Number 5    Number of Visits 17    Date for OT Re-Evaluation 05/05/24    Authorization Type [employer] Medicare primary; [place] Medicaid secondary    OT Start Time 1232    OT Stop Time 1315    OT Time Calculation (min) 43 min    Activity Tolerance Patient tolerated treatment well    Behavior During Therapy WFL for tasks assessed/performed             Past Medical History:  Diagnosis Date   Abnormal Pap smear of cervix    yrs ago   Adenomyosis    Allergy     Anemia    Anxiety    Aortic atherosclerosis (HCC)    Arthritis    Asthma    Chronic UTI    Clostridium difficile infection    Depression    Diabetes mellitus without complication (HCC) 10/22/2023   A1C 8.8   Diverticulosis    Endometriosis    Fibroid    Fibromyalgia    GERD (gastroesophageal reflux disease)    Hiatal hernia    Hyperlipidemia    Hypertension    Internal hemorrhoids    Meningitis, viral    Migraines    Pancreatitis    Pure hypercholesterolemia 12/23/2020   Tubular adenoma of colon    Past Surgical History:  Procedure Laterality Date   APPENDECTOMY  1990   CERVICAL BIOPSY  W/ LOOP ELECTRODE EXCISION     LEEP   CESAREAN SECTION  2002   CHOLECYSTECTOMY  2011   COLONOSCOPY     UPPER GASTROINTESTINAL ENDOSCOPY     URINARY SURGERY     urethra sling, removal, and then revision 2016 (4 surgeries)   WISDOM TOOTH EXTRACTION     Patient Active Problem List   Diagnosis Date Noted   CVA (cerebral vascular accident) (HCC) 01/08/2024   Leukocytosis 01/08/2024   Type 2 diabetes mellitus with chronic kidney disease, with long-term current use of insulin  (HCC) 01/08/2024   Hyperlipidemia 01/08/2024   GERD (gastroesophageal  reflux disease) 01/08/2024   Overweight (BMI 25.0-29.9) 01/08/2024   Moderately severe major depression (HCC) 01/06/2024   Yeast vaginitis 08/30/2023   Iron  deficiency anemia 03/11/2023   PMDD (premenstrual dysphoric disorder) 11/17/2022   Abnormal uterine bleeding (AUB) 11/17/2022   Statin myopathy 09/23/2022   Steatorrhea 08/17/2022   Irregular menses 11/28/2021   Perimenopause 11/28/2021   Episode of recurrent major depressive disorder (HCC) 04/09/2021   BMI 33.0-33.9,adult 04/09/2021   Pure hypercholesterolemia 12/23/2020   Menorrhagia with regular cycle 09/13/2020   Well woman exam 09/13/2020   History of herpes zoster 01/17/2018   Overactive bladder 12/28/2017   Recurrent infections 11/19/2017   Cyst of nasal cavity 11/19/2017   Angio-edema 11/19/2017   Abdominal pain, epigastric 11/04/2017   Dysphagia 11/04/2017   Gastroesophageal reflux disease 07/12/2017   Degenerative disc disease at L5-S1 level 05/14/2017   Fibromyalgia 05/14/2017   ADD (attention deficit disorder) 05/14/2017   Urinary incontinence 04/05/2017   Thigh pain 02/02/2017   Anxiety and depression 02/02/2017   Tobacco abuse 02/02/2017   Facial flushing 02/02/2017   Hypertension 10/23/2016   Hidradenitis 10/23/2016   Diabetes (HCC) 09/12/2014   Lupus anticoagulant positive 07/03/2014   Vitamin  D deficiency 06/06/2014    ONSET DATE: 01/31/2024 (Date of referral)   REFERRING DIAG: I63.81 (ICD-10-CM) - Thalamic stroke (HCC) G81.91 (ICD-10-CM) - Right hemiparesis (HCC) R29.818 (ICD-10-CM) - Hemisensory deficit  THERAPY DIAG:  Muscle weakness (generalized)  Other lack of coordination  Other disturbances of skin sensation  Other symptoms and signs involving the nervous system  Rationale for Evaluation and Treatment: Rehabilitation  SUBJECTIVE:   SUBJECTIVE STATEMENT: Pt reports her pain is a 8/10 most of the time ie) muscle spasms and some days it gets more agitated - ie) doing dishes last week  made things worse for a couple of days.  Pt reports headache with her pain today with some dizziness and some vision issues - ie) slight blurriness over the past few weeks.  Goes for cardio follow up soon but was unable to find the date and plans to reach out to the office.  Pt accompanied by: self  PERTINENT HISTORY: PMH: HTN, anxiety, depression, fibromyalgia, herpes zoster, DM2, statin intolerance, thalamic CVA, CKD   PRECAUTIONS: Fall  WEIGHT BEARING RESTRICTIONS: No  PAIN:  Are you having pain? Yes: NPRS scale: 8/10 Pain location: R side of body (more severe from elbow to shoulder and headache with dizziness Pain description: buzzing Aggravating factors: movement Relieving factors: rest  FALLS: Has patient fallen in last 6 months? 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  HAND DOMINANCE: Right  ADLs: Eating: setup to cut food Grooming: mod I  UB Dressing: min A to fasten bra LB Dressing: mod I  Toileting: mod I Bathing: mod I Tub Shower transfers: mod I Equipment: Grab bars and handheld shower head  IADLs: Shopping: dependent Light housekeeping: dependent Meal Prep: mod I for light meal prep; dependent for larger meals, which she would have cooked before her stroke Community mobility: dependent Medication management: mod I Financial management: mod I Handwriting: 25% legible and Increased time  MOBILITY STATUS: mod I  ACTIVITY TOLERANCE: Activity tolerance: fair  FUNCTIONAL OUTCOME MEASURES: PSFS: 2.7 total score  Total score = sum of the activity scores/number of activities Minimum detectable change  (90%CI) for average score = 2 points Minimum detectable change (90%CI) for single activity score = 3 points  UPPER EXTREMITY ROM:    BUE: WFL though bradykinesia with RUE and impaired opposition  UPPER EXTREMITY MMT:   Poor endurance with RUE testing  MMT Right (eval) Left (eval)  Shoulder flexion 3/5 WNL  Shoulder abduction 3/5 WNL  Elbow flexion 4-/5 WNL  Elbow extension 3/5 WNL  (Blank rows = not tested)  HAND FUNCTION: Grip strength: Right: 25.3 lbs; Left: 49.6 lbs  COORDINATION: 9 Hole Peg test: Right: 37 sec; Left: 24 sec  SENSATION: Light touch: Impaired   EDEMA: none reported or observed  MUSCLE TONE: WFL  COGNITION: Overall cognitive status: Within functional limits for tasks assessed  VISION: Subjective report: no changes  VISION ASSESSMENT: WFL  PERCEPTION: Not tested  PRAXIS: Not tested  OBSERVATIONS: Pt appears well-kept.  TODAY'S TREATMENT :    Manual Techniques:   S/P success with trial of deep touch, massage and vibration last week, OTR trialled cupping.  Tactile assessment revealed moderate muscular tension in the R forearm and triceps with max tension in biceps region with increased tone and palpable adhesions. Myofascial cupping with emulsifying lotion applied to each region. Cups placed along the muscle groups using gliding and momentary static techniques to promote tissue release and increase local circulation. Patient even able to move cup on her own around her forearm using medium and small cups to release tension in forearm.  Patient tolerated the procedure well, and the treatment focused on breaking up fascial adhesions, improving soft tissue mobility, and enhancing circulation to the affected areas, to promote improved AROM through increased mobility of RUE.  Pt reported improvement in flexibility and ROM of hand and  arm as well as slight improved comfort with ROM. No adverse reaction observed during or after treatment.  During manual techniques, OT able to review desensitization techniques due to extreme hypersensitivity and discomfort of RUE, and also discussed and issued education on sensory loss and safety considerations w/ altered sensation ie) hot/cold, sharp/breakable and heavy + chemical.  Pt had a burn on her forearm from air fryer and education provided in modified position, equipment and attention to skin.  - Therapeutic exercises completed for duration as noted below including:  Pt able to complete gross grasp, finger opposition, forearm pronation/supination, wrist flex/ext and radial/ulnar deviation with mirror of L hand for tolerance to AROM with RUE to help with progressing strengthening of affected extremity during tactile input from OTR to diminish discomfort pt reports with any movement in RUE.    PATIENT EDUCATION: Education details: desensitization/cupping Person educated: Patient Education method: Explanation, Demonstration, Tactile cues, and Verbal cues Education comprehension: verbalized understanding, returned demonstration, verbal cues required, and needs further education  HOME EXERCISE PROGRAM: 03/06/2024 putty and coordination HEPs 03/13/24: altered sensory loss education/safety, desensitization techniques  GOALS:  SHORT TERM GOALS: Target date: 03/31/2024    Patient will demonstrate initial R UE HEP with 25% verbal cues or less for proper execution. Baseline: Goal status: IN PROGRESS  2.  Pt will independently recall the 5 main sensory precautions (cold, heat, sharp, chemical, and heavy) as needed to prevent injury/harm secondary to impairments.   Baseline:  Goal status: IN PROGRESS    LONG TERM GOALS: Target date: 05/05/2024  Patient will demonstrate updated R UE HEP with visual handouts only for proper execution. Baseline:  Goal status: IN Progress  2.  Patient  will demonstrate at least 35 lbs R grip strength as needed to open jars and other containers. Baseline: Right: 25.3 lbs Goal status: IN Progress  3.  Patient will demo improved FM coordination as evidenced by completing nine-hole peg with use of R in 30 seconds or less.  Baseline:  Right: 37 sec Goal status: IN Progress  4.  Patient will report at least two-point increase in average PSFS score or at least three-point increase in a single activity score indicating functionally significant improvement given minimum detectable change.  Baseline: 2.7 total score (See above for individual activity scores) Goal status: INITIAL  ASSESSMENT:  CLINICAL IMPRESSION: Patient is a 48 y.o. female who was seen today for occupational therapy treatment following CVA with increased sensory involvement.  Patient responded well to massage, and cupping as options to interrupt negative sensations in RUE and relax tightness in forearm, bicep and tricep regions with some self performance in task  also.  Patient may still require modifications with daily HEP and routines given ongoing sensory involvement resulting in pain complaints with movement. Pt will benefit from continued skilled OT services in the outpatient setting to work on impairments as noted at evaluation to help pt return to [facility] as able.   PERFORMANCE DEFICITS: in functional skills including ADLs, IADLs, coordination, sensation, strength, pain, Fine motor control, Gross motor control, and UE functional use.   IMPAIRMENTS: are limiting patient from ADLs, IADLs, rest and sleep, work, and social participation.   CO-MORBIDITIES: may have co-morbidities  that affects occupational performance. Patient will benefit from skilled OT to address above impairments and improve overall function.  REHAB POTENTIAL: Good  PLAN:  OT FREQUENCY: 2x/week  OT DURATION: 8 weeks  PLANNED INTERVENTIONS: 97168 OT Re-evaluation, 97535 self care/ADL training, 16109  therapeutic exercise, 97530 therapeutic activity, 97112 neuromuscular re-education, 97140 manual therapy, 97035 ultrasound, 97018 paraffin, 60454 fluidotherapy, 97010 moist heat, 97032 electrical stimulation (manual), 97760 Orthotic Initial, 97763 Orthotic/Prosthetic subsequent, energy conservation, coping strategies training, patient/family education, and DME and/or AE instructions  RECOMMENDED OTHER SERVICES: N/A for this visit  CONSULTED AND AGREED WITH PLAN OF CARE: Patient  PLAN FOR NEXT SESSION: consider continuous US  to Rt upper traps followed by K-taping to upper trap and possibly bicep RUE to relax both muscles as pt is guarding and spasming and desensitization Continue manual techniques for re/desensitization.   Zora Hires, OT 03/27/2024, 5:35 PM

## 2024-03-28 DIAGNOSIS — Z5181 Encounter for therapeutic drug level monitoring: Secondary | ICD-10-CM | POA: Diagnosis not present

## 2024-03-28 DIAGNOSIS — I10 Essential (primary) hypertension: Secondary | ICD-10-CM | POA: Diagnosis not present

## 2024-03-29 ENCOUNTER — Ambulatory Visit: Payer: Self-pay | Admitting: Cardiovascular Disease

## 2024-03-29 LAB — BASIC METABOLIC PANEL WITH GFR
BUN/Creatinine Ratio: 19 (ref 9–23)
BUN: 22 mg/dL (ref 6–24)
CO2: 21 mmol/L (ref 20–29)
Calcium: 9.6 mg/dL (ref 8.7–10.2)
Chloride: 98 mmol/L (ref 96–106)
Creatinine, Ser: 1.15 mg/dL — ABNORMAL HIGH (ref 0.57–1.00)
Glucose: 161 mg/dL — ABNORMAL HIGH (ref 70–99)
Potassium: 4.1 mmol/L (ref 3.5–5.2)
Sodium: 134 mmol/L (ref 134–144)
eGFR: 59 mL/min/{1.73_m2} — ABNORMAL LOW (ref >59)

## 2024-03-30 ENCOUNTER — Ambulatory Visit: Admitting: Physical Therapy

## 2024-03-30 DIAGNOSIS — R41841 Cognitive communication deficit: Secondary | ICD-10-CM | POA: Diagnosis not present

## 2024-03-30 DIAGNOSIS — R29818 Other symptoms and signs involving the nervous system: Secondary | ICD-10-CM

## 2024-03-30 DIAGNOSIS — M6281 Muscle weakness (generalized): Secondary | ICD-10-CM

## 2024-03-30 DIAGNOSIS — I69351 Hemiplegia and hemiparesis following cerebral infarction affecting right dominant side: Secondary | ICD-10-CM | POA: Diagnosis not present

## 2024-03-30 DIAGNOSIS — R208 Other disturbances of skin sensation: Secondary | ICD-10-CM | POA: Diagnosis not present

## 2024-03-30 DIAGNOSIS — R278 Other lack of coordination: Secondary | ICD-10-CM

## 2024-03-30 DIAGNOSIS — R2681 Unsteadiness on feet: Secondary | ICD-10-CM | POA: Diagnosis not present

## 2024-03-30 DIAGNOSIS — R2689 Other abnormalities of gait and mobility: Secondary | ICD-10-CM | POA: Diagnosis not present

## 2024-03-30 DIAGNOSIS — R29898 Other symptoms and signs involving the musculoskeletal system: Secondary | ICD-10-CM | POA: Diagnosis not present

## 2024-03-30 NOTE — Therapy (Deleted)
 OUTPATIENT OCCUPATIONAL THERAPY NEURO TREATMENT  Patient Name: Amber Rose MRN: [id number] DOB:October 12, 1976, 48 y.o., female Today's Date: 03/30/2024  PCP: Joaquin Mulberry, MD  REFERRING PROVIDER: Johny Nap, NP  END OF SESSION:    Past Medical History:  Diagnosis Date   Abnormal Pap smear of cervix    yrs ago   Adenomyosis    Allergy     Anemia    Anxiety    Aortic atherosclerosis (HCC)    Arthritis    Asthma    Chronic UTI    Clostridium difficile infection    Depression    Diabetes mellitus without complication (HCC) 10/22/2023   A1C 8.8   Diverticulosis    Endometriosis    Fibroid    Fibromyalgia    GERD (gastroesophageal reflux disease)    Hiatal hernia    Hyperlipidemia    Hypertension    Internal hemorrhoids    Meningitis, viral    Migraines    Pancreatitis    Pure hypercholesterolemia 12/23/2020   Tubular adenoma of colon    Past Surgical History:  Procedure Laterality Date   APPENDECTOMY  1990   CERVICAL BIOPSY  W/ LOOP ELECTRODE EXCISION     LEEP   CESAREAN SECTION  2002   CHOLECYSTECTOMY  2011   COLONOSCOPY     UPPER GASTROINTESTINAL ENDOSCOPY     URINARY SURGERY     urethra sling, removal, and then revision 2016 (4 surgeries)   WISDOM TOOTH EXTRACTION     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/2022   Steatorrhea 08/17/2022   Irregular menses 11/28/2021   Perimenopause  11/28/2021   Episode of recurrent major depressive disorder (HCC) 04/09/2021   BMI 33.0-33.9,adult 04/09/2021   Pure hypercholesterolemia 12/23/2020   Menorrhagia with regular cycle 09/13/2020   Well woman exam 09/13/2020   History of herpes zoster 01/17/2018   Overactive bladder 12/28/2017   Recurrent infections 11/19/2017   Cyst of nasal cavity 11/19/2017   Angio-edema 11/19/2017   Abdominal pain, epigastric 11/04/2017   Dysphagia 11/04/2017   Gastroesophageal reflux disease 07/12/2017   Degenerative disc disease at L5-S1 level 05/14/2017   Fibromyalgia 05/14/2017   ADD (attention deficit disorder) 05/14/2017   Urinary incontinence 04/05/2017   Thigh pain 02/02/2017   Anxiety and depression 02/02/2017   Tobacco abuse 02/02/2017   Facial flushing 02/02/2017   Hypertension 10/23/2016   Hidradenitis 10/23/2016   Diabetes (HCC) 09/12/2014   Lupus anticoagulant positive 07/03/2014   Vitamin D  deficiency 06/06/2014    ONSET DATE: 01/31/2024 (Date of referral)   REFERRING DIAG: I63.81 (ICD-10-CM) - Thalamic stroke (HCC) G81.91 (ICD-10-CM) - Right hemiparesis (HCC) R29.818 (ICD-10-CM) - Hemisensory deficit  THERAPY DIAG:  No diagnosis found.  Rationale for Evaluation and Treatment: Rehabilitation  SUBJECTIVE:   SUBJECTIVE STATEMENT: Pt reports her pain is a 8/10 most of the time ie) muscle spasms and some days it gets more agitated - ie) doing dishes last week made things worse for a couple of days.  Pt reports headache  with her pain today with some dizziness and some vision issues - ie) slight blurriness over the past few weeks.  Goes for cardio follow up soon but was unable to find the date and plans to reach out to the office.  Pt accompanied by: self  PERTINENT HISTORY: PMH: HTN, anxiety, depression, fibromyalgia, herpes zoster, DM2, statin intolerance, thalamic CVA, CKD   PRECAUTIONS: Fall  WEIGHT BEARING RESTRICTIONS: No  PAIN:  Are you having pain? Yes: NPRS  scale: 8/10 Pain location: R side of body (more severe from elbow to shoulder and headache with dizziness Pain description: buzzing Aggravating factors: movement Relieving factors: rest  FALLS: Has patient fallen in last 6 months? 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  HAND DOMINANCE: Right  ADLs: Eating: setup to cut food Grooming: mod I  UB Dressing: min A to fasten bra LB Dressing: mod I  Toileting: mod I Bathing: mod I Tub Shower transfers: mod I Equipment: Grab bars and handheld shower head  IADLs: Shopping: dependent Light housekeeping: dependent Meal Prep: mod I for light meal prep; dependent for larger meals, which she would have cooked before her stroke Community mobility: dependent Medication management: mod I Financial management: mod I Handwriting: 25% legible and Increased time  MOBILITY STATUS: mod I  ACTIVITY TOLERANCE: Activity tolerance: fair  FUNCTIONAL OUTCOME MEASURES: PSFS: 2.7 total score  Total score = sum of the activity scores/number of activities Minimum detectable change (90%CI) for average score = 2 points Minimum detectable change (90%CI) for single activity score = 3 points  UPPER EXTREMITY ROM:    BUE: WFL though bradykinesia with RUE and impaired opposition  UPPER EXTREMITY MMT:   Poor endurance with RUE testing  MMT Right (eval) Left (eval)  Shoulder flexion 3/5 WNL  Shoulder abduction 3/5 WNL  Elbow flexion 4-/5 WNL  Elbow extension 3/5 WNL  (Blank rows = not tested)  HAND FUNCTION: Grip strength: Right: 25.3 lbs; Left: 49.6 lbs  COORDINATION: 9 Hole Peg test: Right:  37 sec; Left: 24 sec  SENSATION: Light touch: Impaired   EDEMA: none reported or observed  MUSCLE TONE: WFL  COGNITION: Overall cognitive status: Within functional limits for tasks assessed  VISION: Subjective report: no changes  VISION ASSESSMENT: WFL  PERCEPTION: Not tested  PRAXIS: Not tested  OBSERVATIONS: Pt appears well-kept.                                                                                                                            TODAY'S TREATMENT :    Manual Techniques:   S/P success with trial of deep touch, massage and vibration last week, OTR trialled cupping.  Tactile assessment revealed moderate muscular tension in the R forearm and triceps with max tension in biceps region with increased tone and palpable adhesions. Myofascial cupping with emulsifying lotion applied to each region. Cups placed along the muscle groups using gliding and momentary static techniques to promote tissue release and increase local circulation. Patient even able to move cup on her own around her forearm using medium and small cups to release tension in forearm.  Patient tolerated the procedure well, and the treatment focused on breaking up fascial adhesions, improving soft tissue mobility, and enhancing circulation to the affected areas, to promote improved AROM through increased mobility of RUE.  Pt reported improvement in flexibility and ROM of hand and arm as well as slight improved comfort with ROM. No adverse reaction observed during or after treatment.  During manual techniques, OT able to review desensitization techniques due to extreme hypersensitivity and discomfort of RUE, and also discussed and issued education on sensory loss and safety considerations w/ altered sensation ie) hot/cold, sharp/breakable and heavy + chemical.  Pt had a burn on her forearm from air fryer and education provided in modified position, equipment and attention to skin.  - Therapeutic  exercises completed for duration as noted below including:  Pt able to complete gross grasp, finger opposition, forearm pronation/supination, wrist flex/ext and radial/ulnar deviation with mirror of L hand for tolerance to AROM with RUE to help with progressing strengthening of affected extremity during tactile input from OTR to diminish discomfort pt reports with any movement in RUE.    PATIENT EDUCATION: Education details: desensitization/cupping Person educated: Patient Education method: Explanation, Demonstration, Tactile cues, and Verbal cues Education comprehension: verbalized understanding, returned demonstration, verbal cues required, and needs further education  HOME EXERCISE PROGRAM: 03/06/2024 putty and coordination HEPs 03/13/24: altered sensory loss education/safety, desensitization techniques  GOALS:  SHORT TERM GOALS: Target date: 03/31/2024   Patient will demonstrate initial R UE HEP with 25% verbal cues or less for proper execution. Baseline: Goal status: IN PROGRESS  2.  Pt will independently recall the 5 main sensory precautions (cold, heat, sharp, chemical, and heavy) as needed to prevent injury/harm secondary to impairments.   Baseline:  Goal status: IN PROGRESS  LONG TERM GOALS: Target date: 05/05/2024  Patient will demonstrate updated R UE HEP with visual handouts only for proper execution. Baseline:  Goal status: IN Progress  2.  Patient will demonstrate at least 35 lbs R grip strength as needed to open jars and other containers. Baseline: Right: 25.3 lbs Goal status: IN Progress  3.  Patient will demo improved FM coordination as evidenced by completing nine-hole peg with use of R in 30 seconds or less.  Baseline:  Right: 37 sec Goal status: IN Progress  4.  Patient will report at least two-point increase in average PSFS score or at least three-point increase in a single activity score indicating functionally significant improvement given minimum detectable  change.  Baseline: 2.7 total score (See above for individual activity scores) Goal status: INITIAL  ASSESSMENT:  CLINICAL IMPRESSION: Patient *** PERFORMANCE DEFICITS: in functional skills including ADLs, IADLs, coordination, sensation, strength, pain, Fine motor control, Gross motor control, and UE functional use.   IMPAIRMENTS: are limiting patient from ADLs, IADLs, rest and sleep, work, and social participation.   CO-MORBIDITIES: may have co-morbidities  that affects occupational performance. Patient will benefit from skilled OT to address above impairments and improve overall function.  REHAB POTENTIAL: Good  PLAN:  OT FREQUENCY: 2x/week  OT DURATION: 8 weeks  PLANNED INTERVENTIONS: 97168 OT Re-evaluation, 97535 self care/ADL training, 98119 therapeutic exercise, 97530 therapeutic activity, 97112 neuromuscular re-education, 97140 manual therapy, 97035 ultrasound, 97018 paraffin, 14782 fluidotherapy, 97010 moist heat, 97032 electrical stimulation (manual), 97760 Orthotic Initial, 97763 Orthotic/Prosthetic subsequent, energy conservation, coping strategies training, patient/family education, and DME and/or AE instructions  RECOMMENDED OTHER SERVICES: N/A for this visit  CONSULTED AND AGREED WITH PLAN OF CARE: Patient  PLAN FOR NEXT SESSION: consider continuous US  to Rt upper traps followed by K-taping to upper trap and possibly bicep RUE to relax both muscles as pt is guarding and spasming and desensitization Continue manual techniques for re/desensitization.   Altamease Asters, OT 03/30/2024, 4:40 PM

## 2024-03-31 ENCOUNTER — Ambulatory Visit: Admitting: Occupational Therapy

## 2024-04-03 ENCOUNTER — Ambulatory Visit

## 2024-04-03 ENCOUNTER — Ambulatory Visit: Admitting: Occupational Therapy

## 2024-04-03 DIAGNOSIS — M6281 Muscle weakness (generalized): Secondary | ICD-10-CM | POA: Diagnosis not present

## 2024-04-03 DIAGNOSIS — R29818 Other symptoms and signs involving the nervous system: Secondary | ICD-10-CM | POA: Diagnosis not present

## 2024-04-03 DIAGNOSIS — R208 Other disturbances of skin sensation: Secondary | ICD-10-CM | POA: Diagnosis not present

## 2024-04-03 DIAGNOSIS — R29898 Other symptoms and signs involving the musculoskeletal system: Secondary | ICD-10-CM

## 2024-04-03 DIAGNOSIS — R2689 Other abnormalities of gait and mobility: Secondary | ICD-10-CM | POA: Diagnosis not present

## 2024-04-03 DIAGNOSIS — R41841 Cognitive communication deficit: Secondary | ICD-10-CM

## 2024-04-03 DIAGNOSIS — R2681 Unsteadiness on feet: Secondary | ICD-10-CM | POA: Diagnosis not present

## 2024-04-03 DIAGNOSIS — R278 Other lack of coordination: Secondary | ICD-10-CM | POA: Diagnosis not present

## 2024-04-03 DIAGNOSIS — I69351 Hemiplegia and hemiparesis following cerebral infarction affecting right dominant side: Secondary | ICD-10-CM | POA: Diagnosis not present

## 2024-04-03 NOTE — Patient Instructions (Signed)
 Brain Dump:  Use note sections on your phone to type or voice text your  inner thoughts, especially if those thoughts are distracting  Reminders App: Set up reminders for consistent tasks  On your screen, hold down reminder then select "remind me in an hour" or "remind me this afternoon" if you need more time

## 2024-04-03 NOTE — Therapy (Signed)
 OUTPATIENT SPEECH LANGUAGE PATHOLOGY TREATMENT   Patient Name: Amber Rose MRN: [id number] DOB:Dec 12, 1975, 48 y.o., female Today's Date: 04/03/2024  PCP: Joaquin Mulberry MD REFERRING PROVIDER: Johny Nap, NP  END OF SESSION:  End of Session - 04/03/24 1114     Visit Number 3    Number of Visits 9    Date for SLP Re-Evaluation 04/19/24    Authorization Type [employer] Medicare    SLP Start Time 1148    SLP Stop Time  1228    SLP Time Calculation (min) 40 min    Activity Tolerance Patient tolerated treatment well              Past Medical History:  Diagnosis Date   Abnormal Pap smear of cervix    yrs ago   Adenomyosis    Allergy     Anemia    Anxiety    Aortic atherosclerosis (HCC)    Arthritis    Asthma    Chronic UTI    Clostridium difficile infection    Depression    Diabetes mellitus without complication (HCC) 10/22/2023   A1C 8.8   Diverticulosis    Endometriosis    Fibroid    Fibromyalgia    GERD (gastroesophageal reflux disease)    Hiatal hernia    Hyperlipidemia    Hypertension    Internal hemorrhoids    Meningitis, viral    Migraines    Pancreatitis    Pure hypercholesterolemia 12/23/2020   Tubular adenoma of colon    Past Surgical History:  Procedure Laterality Date   APPENDECTOMY  1990   CERVICAL BIOPSY  W/ LOOP ELECTRODE EXCISION     LEEP   CESAREAN SECTION  2002   CHOLECYSTECTOMY  2011   COLONOSCOPY     UPPER GASTROINTESTINAL ENDOSCOPY     URINARY SURGERY     urethra sling, removal, and then revision 2016 (4 surgeries)   WISDOM TOOTH EXTRACTION     Patient Active Problem List   Diagnosis Date Noted   CVA (cerebral vascular accident) (HCC) 01/08/2024   Leukocytosis 01/08/2024   Type 2 diabetes mellitus with chronic kidney disease, with long-term current use of insulin  (HCC) 01/08/2024   Hyperlipidemia 01/08/2024   GERD (gastroesophageal reflux disease) 01/08/2024   Overweight (BMI 25.0-29.9) 01/08/2024   Moderately severe  major depression (HCC) 01/06/2024   Yeast vaginitis 08/30/2023   Iron  deficiency anemia 03/11/2023   PMDD (premenstrual dysphoric disorder) 11/17/2022   Abnormal uterine bleeding (AUB) 11/17/2022   Statin myopathy 09/23/2022   Steatorrhea 08/17/2022   Irregular menses 11/28/2021   Perimenopause 11/28/2021   Episode of recurrent major depressive disorder (HCC) 04/09/2021   BMI 33.0-33.9,adult 04/09/2021   Pure hypercholesterolemia 12/23/2020   Menorrhagia with regular cycle 09/13/2020   Well woman exam 09/13/2020   History of herpes zoster 01/17/2018   Overactive bladder 12/28/2017   Recurrent infections 11/19/2017   Cyst of nasal cavity 11/19/2017   Angio-edema 11/19/2017   Abdominal pain, epigastric 11/04/2017   Dysphagia 11/04/2017   Gastroesophageal reflux disease 07/12/2017   Degenerative disc disease at L5-S1 level 05/14/2017   Fibromyalgia 05/14/2017   ADD (attention deficit disorder) 05/14/2017   Urinary incontinence 04/05/2017   Thigh pain 02/02/2017   Anxiety and depression 02/02/2017   Tobacco abuse 02/02/2017   Facial flushing 02/02/2017   Hypertension 10/23/2016   Hidradenitis 10/23/2016   Diabetes (HCC) 09/12/2014   Lupus anticoagulant positive 07/03/2014   Vitamin D  deficiency 06/06/2014    ONSET DATE: 01/31/2024 (referral date)  REFERRING DIAG: I63.81 (ICD-10-CM) - Thalamic stroke (HCC) I69.319 (ICD-10-CM) - Cognitive deficit, post-stroke  THERAPY DIAG:  Cognitive communication deficit  Rationale for Evaluation and Treatment: Rehabilitation  SUBJECTIVE:   SUBJECTIVE STATEMENT: "overall better" Pt accompanied by: self  PERTINENT HISTORY: "Ms. Scotland Printz is a 48 y.o. female with history of DM2, GERD, HTN, HLD, and current tobacco smoking who presented to ED on 01/07/2024 with right face, arm, and leg numbness.  Stroke workup revealed left thalamic infarct secondary to small vessel disease with multiple uncontrolled stroke risk factors.  CTA  head/neck negative LVO, noted premature arthrosclerosis for age.  EF 60 to 65%.  LDL 175.  A1c 6.4.  Recommended DAPT for 3 weeks and aspirin  alone.  Reported significant intolerance to statin medications as well as Repatha , recommended consideration of Leqvio.  DM and HTN controlled on home regimen.  Current tobacco and THC use with cessation counseling provided.  Therapies recommended outpatient PT/OT.   Today, 01/31/2024, patient is being seen for initial hospital follow-up unaccompanied.  Reports residual right-sided numbness, weakness and cognitive difficulty. Reports some improvement of symptoms since discharge. Had initial PT evaluation recently and has f/u visit 4/11.  Interested in pursuing OT due to difficulty with right hand fine motor control and dexterity.  She occasionally feels muscle spasms in her right arm.  PCP started Robaxin  which helped some.  She is also on pregabalin  for neuropathy.  Reports mild short-term memory difficulties since her stroke, will easily lose her train of thought during a conversation and can have delayed recall.  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  She has been on SSI prestroke for fibromyalgia, mood disorder and incontinence post surgical procedure. She did drive yesterday short distance and felt she did okay. She is able to maintain ADLs and majority of IADLs independently.  Denies new stroke/TIA symptoms."  PAIN:  Are you having pain? Yes: NPRS scale: 8/10 Pain location: right side  Pain description: spasms, aching Aggravating factors: unknown Relieving factors: none  FALLS: Has patient fallen in last 6 months?  No  LIVING ENVIRONMENT: Lives with: lives with an adult companion Lives in: House/apartment  PLOF:  Level of assistance: Independent with ADLs, Independent with IADLs Employment: On disability  PATIENT GOALS:  maximize return to baseline   OBJECTIVE:  Note: Objective measures were completed at Evaluation unless otherwise noted.  COGNITION: Overall cognitive status: Impaired Areas of impairment:  Attention: Impaired: Sustained, Alternating, Divided Memory: Impaired: Immediate Short term Prospective Awareness: Impaired: Emergent and Anticipatory Executive function: Impaired: Slow processing Functional deficits: Endorsed some difficulty with immediate recall if pressure present, challenges with multi-tasking, forgetting items daily, and getting distracted while watching TV/movie.   COGNITIVE COMMUNICATION: Following directions: Follows multi-step commands with increased time  Auditory comprehension: WFL Functional communication: Impaired: endorsed occasional stutter, slower rate of speech, and rare word finding episodes  ORAL MOTOR EXAMINATION: Overall status: WFL  STANDARDIZED ASSESSMENTS: CLQT: Attention: WNL, Memory: WNL, Executive Function: WNL, Language: WNL, Visuospatial Skills: WNL, and Clock Drawing: Mild  PATIENT REPORTED OUTCOME MEASURES (PROM): Not completed d/t time constraints  TREATMENT DATE:  04/03/24: Endorsed overall improvements in cognitive communication, including quicker processing, less stuttering, and ability to multi-task easier. Alarm for medication has been helpful. Recommended reminders app for other medication given variations in times taken, as reminders app has ability to remind at later time. Able to set up reminder with rare min A. Demonstrates adequate problem solving for navigating potential return to work. Educated patient on attention strategies to manage internal distractions impacting sustained attention, with handout provided. Pt verbalized understanding and agreement with SLP recommendations. Conversational communication was WNL today.      03/27/24: Halford Levels reports success with am pm meds, forgetting sometimes 4:00 meds. Today targeted compensatory strategy of setting a daily alarm on her phone to recall 4:00 meds. Instructed her to hit snooze instead of stop. In the car, she is having difficulty following GPS and driving, missing exits and turns - we reviewed strategy of eliminating distractions. She continues to forget why she went into a room. Trained in strategy of repeating to herself what she is going into the room for until she retrieves the item or completes the task. Targeted problem solving for financial management generating list of monthly bills, amounts, way its paid and due date with occasional min questioning cues.   02/23/24: ST evaluation and POC complete. Educated patient on parts of CLQT subtests with errors, including clock drawing and story recall. Was previously unaware of errors made during assessment. Provided recommendation to aid recall of medication management, including setting alarms/timers/reminders to support recall given inconsistent administration reported. Pt verbalized agreement.     PATIENT EDUCATION: Education details: see above Person educated: Patient Education method: Explanation Education comprehension: verbalized understanding and needs further education   GOALS: Goals reviewed with patient? Yes  SHORT TERM GOALS: Target date: 03/22/2024 (no ST tx visits completed before STG date)  Pt will successfully recall medication for 2/2 opportunities with use of external memory support  Baseline: Goal status: PARTIALLY MET  2.  Pt will carryover memory compensations to aid recall at home for 2/2 opportunities (ex: locating items) Baseline:  Goal status: NOT MET  3.  Pt will successfully utilize attention strategies to maintain attention for extended periods (I.e., show/movie) for 2/2 opportunities Baseline:  Goal status:  NOT MET   LONG TERM GOALS: Target date: 04/19/2024  Pt will  successfully utilize cognitive compensations to manage iADLs with no errors reported >1 week  Baseline:  Goal status: ONGOING  2.  Pt will successfully complete 2-3 daily tasks with use of cognitive compensations > 1 week  Baseline:  Goal status: ONGOING  3.  Pt will maintain WNL communication x2 sessions given rare min A  Baseline: 04/03/24 Goal status: ONGOING  4. Pt will improve self-rating of cognitive functioning by at least 1 point by LTG date  Baseline: self-rated 7/10  Goal Status: ONGOING  ASSESSMENT:  CLINICAL IMPRESSION: Patient is a 48 y.o. F who was seen today for ST tx s/p  stroke in March 2025. Continued education and instruction of cognitive strategies and compensations to maximize return to baseline functioning. Good carryover exhibited thus far. Pt would benefit from skilled ST intervention to maximize return to baseline and overall QoL.   OBJECTIVE IMPAIRMENTS: include attention, memory, awareness, executive functioning, and expressive language. These impairments are limiting patient from household responsibilities and effectively communicating at home and in community.Factors affecting potential to achieve goals and functional outcome are previous level of function.. Patient will benefit from skilled SLP services to address above impairments and improve  overall function.  REHAB POTENTIAL: Good  PLAN:  SLP FREQUENCY: 1x/week  SLP DURATION: 8 weeks  PLANNED INTERVENTIONS: Language facilitation, Environmental controls, Cueing hierachy, Cognitive reorganization, Functional tasks, SLP instruction and feedback, Compensatory strategies, Patient/family education, [phone] Treatment of speech (30 or 45 min) , and [id number]- Speech Eval Sound Prod, Artic, Phon, Eval Compre, Express    Tamar Fairly, CCC-SLP 04/03/2024, 2:59 PM

## 2024-04-03 NOTE — Patient Instructions (Signed)
Desensitization program  The most effective way to diminish hypersensitivity is by repeated touching of the sensitive area.  The following program will assist in the desensitization process and should be performed 5 to 10 minutes of each hour you are awake:  1.  Beginning with the texture which causes the least discomfort, rub the sensitive area lightly for 10 minutes or until area feels numb and no longer sensitive.  2.  In an hour, return to the same texture and rub as before.  However, if this texture seems to cause no abnormal feelings, it is time to progress to a rougher texture.  Do not return to the softer texture; continue to progress through the list until you complete it.  It is important to be very consistent with this treatment.  The closer the program is followed, the faster you will find relief of your symptoms.  1.  Fur/cotton balls 2.  Flannel cloth 3.  Cotton fabric 4.  Denim fabric 5.  Burlap 6.  Raw peas/beans 7.  Raw rice 8.  Uncooked macaroni 9.  Metal, i.e. paperclips 10.  Tapping on edge of table 11.  Vibration 

## 2024-04-03 NOTE — Therapy (Signed)
 OUTPATIENT OCCUPATIONAL THERAPY NEURO TREATMENT  Patient Name: Amber Rose MRN: [id number] DOB:Oct 26, 1976, 48 y.o., female Today's Date: 04/03/2024  PCP: Joaquin Mulberry, MD  REFERRING PROVIDER: Johny Nap, NP  END OF SESSION:  OT End of Session - 04/03/24 1102     Visit Number 6    Number of Visits 17    Date for OT Re-Evaluation 05/05/24    Authorization Type [employer] Medicare primary; [place] Medicaid secondary    OT Start Time 1102    OT Stop Time 1145    OT Time Calculation (min) 43 min    Activity Tolerance Patient tolerated treatment well    Behavior During Therapy WFL for tasks assessed/performed              Past Medical History:  Diagnosis Date   Abnormal Pap smear of cervix    yrs ago   Adenomyosis    Allergy     Anemia    Anxiety    Aortic atherosclerosis (HCC)    Arthritis    Asthma    Chronic UTI    Clostridium difficile infection    Depression    Diabetes mellitus without complication (HCC) 10/22/2023   A1C 8.8   Diverticulosis    Endometriosis    Fibroid    Fibromyalgia    GERD (gastroesophageal reflux disease)    Hiatal hernia    Hyperlipidemia    Hypertension    Internal hemorrhoids    Meningitis, viral    Migraines    Pancreatitis    Pure hypercholesterolemia 12/23/2020   Tubular adenoma of colon    Past Surgical History:  Procedure Laterality Date   APPENDECTOMY  1990   CERVICAL BIOPSY  W/ LOOP ELECTRODE EXCISION     LEEP   CESAREAN SECTION  2002   CHOLECYSTECTOMY  2011   COLONOSCOPY     UPPER GASTROINTESTINAL ENDOSCOPY     URINARY SURGERY     urethra sling, removal, and then revision 2016 (4 surgeries)   WISDOM TOOTH EXTRACTION     Patient Active Problem List   Diagnosis Date Noted   CVA (cerebral vascular accident) (HCC) 01/08/2024   Leukocytosis 01/08/2024   Type 2 diabetes mellitus with chronic kidney disease, with long-term current use of insulin  (HCC) 01/08/2024   Hyperlipidemia 01/08/2024   GERD (gastroesophageal  reflux disease) 01/08/2024   Overweight (BMI 25.0-29.9) 01/08/2024   Moderately severe major depression (HCC) 01/06/2024   Yeast vaginitis 08/30/2023   Iron  deficiency anemia 03/11/2023   PMDD (premenstrual dysphoric disorder) 11/17/2022   Abnormal uterine bleeding (AUB) 11/17/2022   Statin myopathy 09/23/2022   Steatorrhea 08/17/2022   Irregular menses 11/28/2021   Perimenopause 11/28/2021   Episode of recurrent major depressive disorder (HCC) 04/09/2021   BMI 33.0-33.9,adult 04/09/2021   Pure hypercholesterolemia 12/23/2020   Menorrhagia with regular cycle 09/13/2020   Well woman exam 09/13/2020   History of herpes zoster 01/17/2018   Overactive bladder 12/28/2017   Recurrent infections 11/19/2017   Cyst of nasal cavity 11/19/2017   Angio-edema 11/19/2017   Abdominal pain, epigastric 11/04/2017   Dysphagia 11/04/2017   Gastroesophageal reflux disease 07/12/2017   Degenerative disc disease at L5-S1 level 05/14/2017   Fibromyalgia 05/14/2017   ADD (attention deficit disorder) 05/14/2017   Urinary incontinence 04/05/2017   Thigh pain 02/02/2017   Anxiety and depression 02/02/2017   Tobacco abuse 02/02/2017   Facial flushing 02/02/2017   Hypertension 10/23/2016   Hidradenitis 10/23/2016   Diabetes (HCC) 09/12/2014   Lupus anticoagulant positive 07/03/2014  Vitamin D  deficiency 06/06/2014    ONSET DATE: 01/31/2024 (Date of referral)   REFERRING DIAG: I63.81 (ICD-10-CM) - Thalamic stroke (HCC) G81.91 (ICD-10-CM) - Right hemiparesis (HCC) R29.818 (ICD-10-CM) - Hemisensory deficit  THERAPY DIAG:  Muscle weakness (generalized)  Other lack of coordination  Other disturbances of skin sensation  Other symptoms and signs involving the nervous system  Other symptoms and signs involving the musculoskeletal system  Rationale for Evaluation and Treatment: Rehabilitation  SUBJECTIVE:   SUBJECTIVE STATEMENT: Pt reports her pain was improved with cupping last session.    She has been burning her RUE, especially when reaching into air fryer.   Pt accompanied by: self  PERTINENT HISTORY: PMH: HTN, anxiety, depression, fibromyalgia, herpes zoster, DM2, statin intolerance, thalamic CVA, CKD   PRECAUTIONS: Fall  WEIGHT BEARING RESTRICTIONS: No  PAIN:  Are you having pain? Yes: NPRS scale: 8/10 Pain location: R side of body (more severe from elbow to shoulder and headache with dizziness Pain description: buzzing Aggravating factors: movement Relieving factors: rest  FALLS: Has patient fallen in last 6 months? 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  HAND DOMINANCE: Right  ADLs: Eating: setup to cut food Grooming: mod I  UB Dressing: min A to fasten bra LB Dressing: mod I  Toileting: mod I Bathing: mod I Tub Shower transfers: mod I Equipment: Grab bars and handheld shower head  IADLs: Shopping: dependent Light housekeeping: dependent Meal Prep: mod I for light meal prep; dependent for larger meals, which she would have cooked before her stroke Community mobility: dependent Medication management: mod I Financial management: mod I Handwriting: 25% legible and Increased time  MOBILITY STATUS: mod I  ACTIVITY TOLERANCE: Activity tolerance: fair  FUNCTIONAL OUTCOME MEASURES: PSFS: 2.7 total score  Total score = sum of the activity scores/number of activities Minimum detectable change (90%CI) for average score = 2 points Minimum detectable change (90%CI) for single activity score = 3 points  UPPER EXTREMITY ROM:    BUE: WFL though bradykinesia with RUE and impaired  opposition  UPPER EXTREMITY MMT:   Poor endurance with RUE testing  MMT Right (eval) Left (eval)  Shoulder flexion 3/5 WNL  Shoulder abduction 3/5 WNL  Elbow flexion 4-/5 WNL  Elbow extension 3/5 WNL  (Blank rows = not tested)  HAND FUNCTION: Grip strength: Right: 25.3 lbs; Left: 49.6 lbs  COORDINATION: 9 Hole Peg test: Right: 37 sec; Left: 24 sec  SENSATION: Light touch: Impaired   EDEMA: none reported or observed  MUSCLE TONE: WFL  COGNITION: Overall cognitive status: Within functional limits for tasks assessed  VISION: Subjective report: no changes  VISION ASSESSMENT: WFL  PERCEPTION: Not tested  PRAXIS: Not tested  OBSERVATIONS: Pt appears well-kept.  TODAY'S TREATMENT :    Per subjective, OT educated pt on how to obtain items such as long, oven gloves and suction cups to help prevent burns and reduce pain respectively.   OT reviewed sensory precautions with pt requiring max cues for proper recall. Discussed ways to remember 5 main precautions (hot, cold, heavy, chemical, and sharp) as well as ways to practice caution around these items such as use of specific gloves or food processor, etc.   OT provided additional education with respect to desensitization of RUE as noted in pt instructions. Pt was encouraged to utilize item that is "irritating but tolerable" for the full 10 minutes as many times throughout the day as possible. She should advance textures as needed and not go back to prior texture. This process should be completed daily for 3 weeks in efforts to reduce hypersensitivity.   Pt completed desensitization of RUE by rubbing denim over arm for 10 minutes. Irritation noted initially but became lessened at end of 10 minutes. Pt encouraged to use timer for home cpmpletion.   PATIENT EDUCATION: Education details:  desensitization/cupping Person educated: Patient Education method: Explanation, Demonstration, Tactile cues, and Verbal cues Education comprehension: verbalized understanding, returned demonstration, verbal cues required, and needs further education  HOME EXERCISE PROGRAM: 03/06/2024 putty and coordination HEPs 03/13/24: altered sensory loss education/safety, desensitization techniques 04/03/2024: Additional desensitization  GOALS:  SHORT TERM GOALS: Target date: 03/31/2024   Patient will demonstrate initial R UE HEP with 25% verbal cues or less for proper execution. Baseline: Goal status: IN PROGRESS  2.  Pt will independently recall the 5 main sensory precautions (cold, heat, sharp, chemical, and heavy) as needed to prevent injury/harm secondary to impairments.   Baseline:  Goal status: IN PROGRESS  LONG TERM GOALS: Target date: 05/05/2024  Patient will demonstrate updated R UE HEP with visual handouts only for proper execution. Baseline:  Goal status: IN Progress  2.  Patient will demonstrate at least 35 lbs R grip strength as needed to open jars and other containers. Baseline: Right: 25.3 lbs Goal status: IN Progress  3.  Patient will demo improved FM coordination as evidenced by completing nine-hole peg with use of R in 30 seconds or less.  Baseline:  Right: 37 sec Goal status: IN Progress  4.  Patient will report at least two-point increase in average PSFS score or at least three-point increase in a single activity score indicating functionally significant improvement given minimum detectable change.  Baseline: 2.7 total score (See above for individual activity scores) Goal status: INITIAL  ASSESSMENT:  CLINICAL IMPRESSION: Patient demonstrates gradual progression towards goals. Remains limited by RUE pain. Believe this would be improved with more regimented desensitization program/carry-over.  PERFORMANCE DEFICITS: in functional skills including ADLs, IADLs, coordination,  sensation, strength, pain, Fine motor control, Gross motor control, and UE functional use.   IMPAIRMENTS: are limiting patient from ADLs, IADLs, rest and sleep, work, and social participation.   CO-MORBIDITIES: may have co-morbidities  that affects occupational performance. Patient will benefit from skilled OT to address above impairments and improve overall function.  REHAB POTENTIAL: Good  PLAN:  OT FREQUENCY: 2x/week  OT DURATION: 8 weeks  PLANNED INTERVENTIONS: 97168 OT Re-evaluation, 97535 self care/ADL training, 16109 therapeutic exercise, 97530 therapeutic activity, 97112 neuromuscular re-education, 97140 manual therapy, 97035 ultrasound, 97018 paraffin, 60454 fluidotherapy, 97010 moist heat, 97032 electrical stimulation (manual), 97760 Orthotic Initial, [id number] Orthotic/Prosthetic subsequent, energy conservation, coping strategies training, patient/family education, and DME and/or AE instructions  RECOMMENDED OTHER SERVICES: N/A  for this visit  CONSULTED AND AGREED WITH PLAN OF CARE: Patient  PLAN FOR NEXT SESSION: consider continuous US  to Rt upper traps followed by K-taping to upper trap and possibly bicep RUE to relax both muscles as pt is guarding and spasming  Continue manual techniques for re/desensitization.  Altamease Asters, OT 04/03/2024, 1:43 PM

## 2024-04-04 ENCOUNTER — Encounter: Payer: Self-pay | Admitting: Physical Therapy

## 2024-04-04 ENCOUNTER — Ambulatory Visit

## 2024-04-04 ENCOUNTER — Other Ambulatory Visit: Payer: Self-pay | Admitting: Family Medicine

## 2024-04-04 DIAGNOSIS — N3281 Overactive bladder: Secondary | ICD-10-CM

## 2024-04-04 NOTE — Therapy (Signed)
 OUTPATIENT PHYSICAL THERAPY NEURO TREATMENT   Patient Name: Amber Rose MRN: [id number] DOB:[birth year], 48 y.o., female Today's Date: 04/04/2024   PCP: Joaquin Mulberry, MD REFERRING PROVIDER: Hassie Lint, PA-C  END OF SESSION:   03/30/24 0845  PT Visits / Re-Eval  Visit Number 9  Number of Visits 17 (9 + 8)  Date for PT Re-Evaluation 05/05/24 (pushed out due to future multi-disciplinary and aquatic needs)  Authorization  Authorization Type [employer] dual complete  PT Time Calculation  PT Start Time 0840  PT Stop Time 0933  PT Time Calculation (min) 53 min  PT - End of Session  Equipment Utilized During Treatment Other (comment) (floatation devices as needed)  Activity Tolerance Patient tolerated treatment well  Behavior During Therapy WFL for tasks assessed/performed     Past Medical History:  Diagnosis Date   Abnormal Pap smear of cervix    yrs ago   Adenomyosis    Allergy     Anemia    Anxiety    Aortic atherosclerosis (HCC)    Arthritis    Asthma    Chronic UTI    Clostridium difficile infection    Depression    Diabetes mellitus without complication (HCC) 10/22/2023   A1C 8.8   Diverticulosis    Endometriosis    Fibroid    Fibromyalgia    GERD (gastroesophageal reflux disease)    Hiatal hernia    Hyperlipidemia    Hypertension    Internal hemorrhoids    Meningitis, viral    Migraines    Pancreatitis    Pure hypercholesterolemia 12/23/2020   Tubular adenoma of colon    Past Surgical History:  Procedure Laterality Date   APPENDECTOMY  1990   CERVICAL BIOPSY  W/ LOOP ELECTRODE EXCISION     LEEP   CESAREAN SECTION  2002   CHOLECYSTECTOMY  2011   COLONOSCOPY     UPPER GASTROINTESTINAL ENDOSCOPY     URINARY SURGERY     urethra sling, removal, and then revision 2016 (4 surgeries)   WISDOM TOOTH EXTRACTION     Patient Active Problem List   Diagnosis Date Noted   CVA (cerebral vascular accident) (HCC) 01/08/2024   Leukocytosis  01/08/2024   Type 2 diabetes mellitus with chronic kidney disease, with long-term current use of insulin  (HCC) 01/08/2024   Hyperlipidemia 01/08/2024   GERD (gastroesophageal reflux disease) 01/08/2024   Overweight (BMI 25.0-29.9) 01/08/2024   Moderately severe major depression (HCC) 01/06/2024   Yeast vaginitis 08/30/2023   Iron  deficiency anemia 03/11/2023   PMDD (premenstrual dysphoric disorder) 11/17/2022   Abnormal uterine bleeding (AUB) 11/17/2022   Statin myopathy 09/23/2022   Steatorrhea 08/17/2022   Irregular menses 11/28/2021   Perimenopause 11/28/2021   Episode of recurrent major depressive disorder (HCC) 04/09/2021   BMI 33.0-33.9,adult 04/09/2021   Pure hypercholesterolemia 12/23/2020   Menorrhagia with regular cycle 09/13/2020   Well woman exam 09/13/2020   History of herpes zoster 01/17/2018   Overactive bladder 12/28/2017   Recurrent infections 11/19/2017   Cyst of nasal cavity 11/19/2017   Angio-edema 11/19/2017   Abdominal pain, epigastric 11/04/2017   Dysphagia 11/04/2017   Gastroesophageal reflux disease 07/12/2017   Degenerative disc disease at L5-S1 level 05/14/2017   Fibromyalgia 05/14/2017   ADD (attention deficit disorder) 05/14/2017   Urinary incontinence 04/05/2017   Thigh pain 02/02/2017   Anxiety and depression 02/02/2017   Tobacco abuse 02/02/2017   Facial flushing 02/02/2017   Hypertension 10/23/2016   Hidradenitis 10/23/2016   Diabetes (HCC) 09/12/2014  Lupus anticoagulant positive 07/03/2014   Vitamin D  deficiency 06/06/2014    ONSET DATE: 01/07/2024 (Lacunar CVA)  REFERRING DIAG: G81.91 (ICD-10-CM) - Right hemiparesis (HCC) I63.9 (ICD-10-CM) - Cerebrovascular accident (CVA), unspecified mechanism (HCC)  THERAPY DIAG:  Muscle weakness (generalized)  Other lack of coordination  Other disturbances of skin sensation  Other symptoms and signs involving the nervous system  Hemiplegia and hemiparesis following cerebral infarction  affecting right dominant side (HCC)  Rationale for Evaluation and Treatment: Rehabilitation  SUBJECTIVE:                                                                                                                                                                                             SUBJECTIVE STATEMENT: Pt presents for aquatics dressed and ready reporting she feels the same today as visit prior.  She ambulates w/o AD and presents alone. Pt accompanied by: self (drove herself)  PERTINENT HISTORY: HTN, anxiety, depression, fibromyalgia, herpes zoster, DM2, statin intolerance, thalamic CVA, CKD  PAIN:  Are you having pain? Yes: NPRS scale: 7 Pain location: right arm Pain description: tingling/cramping Aggravating factors: using the arm, laying on the right Relieving factors: sitting still  PRECAUTIONS: Fall  RED FLAGS: Bowel or bladder incontinence: Yes: urinary incontinence - wearing depends today and MD aware per report   WEIGHT BEARING RESTRICTIONS: No  FALLS: Has patient fallen in last 6 months? No  LIVING ENVIRONMENT: Lives with: lives with an adult companion Lives in: House/apartment Stairs: Yes: External: 3 steps; none Has following equipment at home: Grab bars  PLOF: Independent with gait, Independent with transfers, Needs assistance with ADLs, and Needs assistance with homemaking  PATIENT GOALS: "Everything is really up top so I don't really know about PT."  OBJECTIVE:  Note: Objective measures were completed at Evaluation unless otherwise noted.  DIAGNOSTIC FINDINGS:  MR Brain 01/08/2024: IMPRESSION: Acute lacunar infarct in the left thalamus.  COGNITION: Overall cognitive status: pt reports some difficulty with memory/losing her place with tasks due to focus   SENSATION: Light touch: WFL - pt reports decreased R > L  COORDINATION: LE RAMS;  impaired Heel-to-shin:  WNL bilaterally  EDEMA:  WNL  MUSCLE TONE: Difficulty discerning flexion  spasticity due to pt difficulty relaxing  POSTURE: rounded shoulders and forward head  LOWER EXTREMITY ROM:     Active  Right Eval Left Eval  Hip flexion WNL WNL  Hip extension    Hip abduction    Hip adduction    Hip internal rotation    Hip external rotation    Knee flexion " "  Knee extension " "  Ankle dorsiflexion " "  Ankle plantarflexion    Ankle inversion    Ankle eversion     (Blank rows = not tested)  LOWER EXTREMITY MMT:    MMT Right Eval Left Eval  Hip flexion 4-/5 4+/5  Hip extension    Hip abduction    Hip adduction    Hip internal rotation    Hip external rotation    Knee flexion 4-/5 4+/5  Knee extension 4/5 5/5  Ankle dorsiflexion 4+/5 5/5  Ankle plantarflexion    Ankle inversion    Ankle eversion    (Blank rows = not tested)  BED MOBILITY:  Pt sleeps in standard bed and requires increased time and momentum to get roll and has difficulty managing bedding  TRANSFERS: Assistive device utilized: None  Sit to stand: SBA Stand to sit: SBA Chair to chair: SBA  GAIT: Gait pattern: bilateral ER, step through pattern, decreased stride length, decreased hip/knee flexion- Right, decreased hip/knee flexion- Left, decreased ankle dorsiflexion- Right, decreased ankle dorsiflexion- Left, trendelenburg, and wide BOS Distance walked: various clinic distances Assistive device utilized: None Level of assistance: SBA Comments: Pt has severe drift from pathway to left then right when entering clinic, increased time to correct path with cuing.  Truncal ataxia?  FUNCTIONAL TESTS:  5 times sit to stand: 15.13 sec w/ light BUE support, severe hyperextension in standing, mild weight shift left during sitting Timed up and go (TUG): 11.28 sec SBA, no instability in turning with small radius 10 meter walk test: 8.50 sec = 1.18 m/sec OR 3.88 ft/sec Functional gait assessment: To be assessed.  PATIENT SURVEYS:  None completed due to time.                                                                                                                               TREATMENT DATE: 03/30/2024  Aquatic therapy at [facility] - pool temperature 92 degrees   Patient seen for aquatic therapy today.  Treatment took place in water 3.6-4.8 feet deep depending upon activity.  Patient entered and exited the pool via stairs using reciprocal pattern and bilateral rails at Mod I level.   Exercises: Water walking warmup:  forwards > laterally > sideways unsupported 4x18 ft -Core:  Multicolored low resistance DB bilateral shoulder flexion > abduction > chest press 2x20 each > swing sit on yellow noodle practicing lateral pelvic weight shift then added ipsilateral reach for increased challenge > swing sit marching > Straddle sit on yellow noodle floating maintaining upright > straddle w/ arm paddling 2x18 ft > straddle with BUE/BLE paddling 2x18 ft -6" step ups forward and laterally x20 each alternating LE unsupported -Stretching:  runner's stretch x1 minute each LE, hamstring stretch into slow adductor stretch on steps 2x1 minute each LE, SKTC in sitting 3x30 sec each side  Patient requires buoyancy of the water for support for reduced fall risk with gait training and balance exercises with SBA support. Exercises able to be performed safely in  water without the risk of fall compared to those same exercises performed on land; viscosity of water needed for resistance for strengthening. Current of water provides perturbations for challenging static and dynamic balance.   PATIENT EDUCATION: Education details:  Aquatic rationale. Person educated: Patient Education method: Explanation Education comprehension: verbalized understanding and needs further education  HOME EXERCISE PROGRAM: Access Code: [email or web address] URL: [email or web address] Date: 02/04/2024 Prepared by: Marilou Showman  Exercises - Sit to Stand with Armchair  - 1 x daily - 7 x weekly - 3 sets - 10  reps - Corner Balance Feet Together With Eyes Open  - 1 x daily - 5 x weekly - 1 sets - 3-4 reps - 30 seconds hold - Corner Balance Feet Together With Eyes Closed  - 1 x daily - 5 x weekly - 1 sets - 3-4 reps - 30 seconds hold - Corner Balance Feet Together: Eyes Open With Head Turns  - 1 x daily - 5 x weekly - 1 sets - 3-4 reps - 30 seconds hold - Romberg Stance with Head Nods  - 1 x daily - 5 x weekly - 2-3 sets - 10 reps - Tandem Walking with Counter Support  - 1 x daily - 5 x weekly - 3 sets - 10 reps  Chair yoga poses reviewed 5/7  GOALS: Goals reviewed with patient? Yes  SHORT TERM GOALS: Target date: 02/25/2024  Pt will be independent and compliant with initial strength and balance focused HEP in order to maintain functional progress and improve mobility. Baseline:  IND and compliant (5/7) Goal status: MET  2.  Pt will decrease 5xSTS to </=12 seconds w/ improved upright neutral in order to demonstrate decreased risk for falls and improved functional bilateral LE strength and power. Baseline: 15.13 sec w/ light BUE support and severe lumbar hyperextension on upright; 15.21 sec w/o UE support (5/7) Goal status: IN PROGRESS  LONG TERM GOALS: Target date: 03/24/2024  Pt will be independent and compliant with advanced and finalized strength and balance focused HEP in order to maintain functional progress and improve mobility. Baseline: Pt compliant and IND (5/28) Goal status: MET  2.  Pt will demonstrate a gait speed of >/=4.08 feet/sec in order to decrease risk for falls. Baseline: 3.88 ft/sec; 3.37 ft/sec (5/28) Goal status: NOT MET  3.  Pt will ambulate >/=500 feet with LRAD at no more than mod I level of assist over unlevel/compliant surfaces, curb step and ramp with improved trunk stability to promote household and community access. Baseline: instability over level ground/SBA; IND ramp/curb/500 ft over unlevel sidewalk and grass w/ only mild lateral wobble w/ transitions  (5/28) Goal status: MET  4.  Pt will improve FGA score to >/=19/30 in order to demonstrate improved balance and decreased fall risk. Baseline: 13/30 (4/11); 25/30 (5/28) Goal status: MET  ASSESSMENT:  CLINICAL IMPRESSION: Focus of skilled session today on addressing ongoing pain and tolerance to upright mobility.  She demonstrates great core engagement with high level challenges using noodle and dynamic mobility.  She remains challenged by tightness in her BLE particularly in her hamstrings and adductors noted during static stretch this visit.  Will continue per POC.  OBJECTIVE IMPAIRMENTS: Abnormal gait, decreased activity tolerance, decreased balance, decreased coordination, decreased knowledge of condition, decreased knowledge of use of DME, decreased strength, increased muscle spasms, impaired sensation, improper body mechanics, postural dysfunction, and pain.   ACTIVITY LIMITATIONS: carrying, lifting, bending, standing, squatting, stairs, transfers, bed mobility, continence, bathing, dressing, self feeding, reach over  head, and locomotion level  PARTICIPATION LIMITATIONS: meal prep, cleaning, laundry, driving, community activity, and occupation  PERSONAL FACTORS: Fitness, Past/current experiences, Transportation, and 3+ comorbidities: HTN, fibromyalgia, DM2 are also affecting patient's functional outcome.   REHAB POTENTIAL: Good  CLINICAL DECISION MAKING: Evolving/moderate complexity  EVALUATION COMPLEXITY: Moderate  PLAN:  PT FREQUENCY: 1x/week + 2x/wk (1x in aquatics and 1x on land)  PT DURATION: 8 weeks + 4 wks  PLANNED INTERVENTIONS: [id number]- PT Re-evaluation, 97750- Physical Performance Testing, 97110-Therapeutic exercises, 97530- Therapeutic activity, [id number]- Neuromuscular re-education, 97535- Self Care, [id number]- Manual therapy, [id number]- Gait training, [phone]- Orthotic Initial, [phone]- Aquatic Therapy, [phone]- Electrical stimulation (manual), Patient/Family education, Balance  training, Stair training, Taping, Dry Needling, Joint mobilization, Spinal mobilization, Vestibular training, DME instructions, Cryotherapy, and Moist heat  PLAN FOR NEXT SESSION: Expand HEP - may benefit from addition of dynamic balance (monster walks, backwards marching, squatting) - REVIEW IN FUTURE SESSION.  Gait training.  weighted vest w/ hurdles/dynamic stability tasks.  RLE NMR.  Blaze pods.  Review birddogs.  Physioball core and seated stability? R shoulder tightness.  AQUATICS:  Plan for aquatic HEP, UE mobility/sensation and pain management, LE stretching and strengthening, general balance  Earlean Glaze, PT, DPT 04/04/2024, 11:00 AM

## 2024-04-05 ENCOUNTER — Ambulatory Visit: Admitting: Occupational Therapy

## 2024-04-05 ENCOUNTER — Other Ambulatory Visit: Payer: Self-pay | Admitting: Family Medicine

## 2024-04-05 ENCOUNTER — Encounter: Payer: Self-pay | Admitting: Family Medicine

## 2024-04-05 DIAGNOSIS — N939 Abnormal uterine and vaginal bleeding, unspecified: Secondary | ICD-10-CM

## 2024-04-05 NOTE — Telephone Encounter (Signed)
 Patient requesting new GYN referral, note in epic states that patient did not want to schedule with the office selected.

## 2024-04-07 ENCOUNTER — Ambulatory Visit: Admitting: Obstetrics and Gynecology

## 2024-04-07 ENCOUNTER — Ambulatory Visit
Admission: RE | Admit: 2024-04-07 | Discharge: 2024-04-07 | Disposition: A | Discharge status: Home / Self Care | Source: Ambulatory Visit | Attending: Family Medicine | Admitting: Family Medicine

## 2024-04-07 ENCOUNTER — Other Ambulatory Visit: Payer: Self-pay | Admitting: "Endocrinology

## 2024-04-07 DIAGNOSIS — Z1231 Encounter for screening mammogram for malignant neoplasm of breast: Secondary | ICD-10-CM

## 2024-04-10 ENCOUNTER — Ambulatory Visit

## 2024-04-10 ENCOUNTER — Ambulatory Visit: Admitting: Occupational Therapy

## 2024-04-10 NOTE — Therapy (Deleted)
 OUTPATIENT SPEECH LANGUAGE PATHOLOGY TREATMENT   Patient Name: Amber Rose MRN: [id number] DOB:[birth year], 48 y.o., female Today's Date: 04/10/2024  PCP: Joaquin Mulberry MD REFERRING PROVIDER: Johny Nap, NP  END OF SESSION:     Past Medical History:  Diagnosis Date   Abnormal Pap smear of cervix    yrs ago   Adenomyosis    Allergy     Anemia    Anxiety    Aortic atherosclerosis (HCC)    Arthritis    Asthma    Chronic UTI    Clostridium difficile infection    Depression    Diabetes mellitus without complication (HCC) 10/22/2023   A1C 8.8   Diverticulosis    Endometriosis    Fibroid    Fibromyalgia    GERD (gastroesophageal reflux disease)    Hiatal hernia    Hyperlipidemia    Hypertension    Internal hemorrhoids    Meningitis, viral    Migraines    Pancreatitis    Pure hypercholesterolemia 12/23/2020   Tubular adenoma of colon    Past Surgical History:  Procedure Laterality Date   APPENDECTOMY  1990   CERVICAL BIOPSY  W/ LOOP ELECTRODE EXCISION     LEEP   CESAREAN SECTION  2002   CHOLECYSTECTOMY  2011   COLONOSCOPY     UPPER GASTROINTESTINAL ENDOSCOPY     URINARY SURGERY     urethra sling, removal, and then revision 2016 (4 surgeries)   WISDOM TOOTH EXTRACTION     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/2022   Steatorrhea 08/17/2022   Irregular menses 11/28/2021   Perimenopause  11/28/2021   Episode of recurrent major depressive disorder (HCC) 04/09/2021   BMI 33.0-33.9,adult 04/09/2021   Pure hypercholesterolemia 12/23/2020   Menorrhagia with regular cycle 09/13/2020   Well woman exam 09/13/2020   History of herpes zoster 01/17/2018   Overactive bladder 12/28/2017   Recurrent infections 11/19/2017   Cyst of nasal cavity 11/19/2017   Angio-edema 11/19/2017   Abdominal pain, epigastric 11/04/2017   Dysphagia 11/04/2017   Gastroesophageal reflux disease 07/12/2017   Degenerative disc disease at L5-S1 level 05/14/2017   Fibromyalgia 05/14/2017   ADD (attention deficit disorder) 05/14/2017   Urinary incontinence 04/05/2017   Thigh pain 02/02/2017   Anxiety and depression 02/02/2017   Tobacco abuse 02/02/2017   Facial flushing 02/02/2017   Hypertension 10/23/2016   Hidradenitis 10/23/2016   Diabetes (HCC) 09/12/2014   Lupus anticoagulant positive 07/03/2014   Vitamin D  deficiency 06/06/2014    ONSET DATE: 01/31/2024 (referral date)   REFERRING DIAG: I63.81 (ICD-10-CM) - Thalamic stroke (HCC) I69.319 (ICD-10-CM) - Cognitive deficit, post-stroke  THERAPY DIAG:  No diagnosis found.  Rationale for Evaluation and Treatment: Rehabilitation  SUBJECTIVE:   SUBJECTIVE STATEMENT:  Pt accompanied by: self  PERTINENT HISTORY: [name] is a 48 y.o. female with history of DM2, GERD, HTN, HLD, and current tobacco smoking who presented to ED on 01/07/2024 with right face, arm, and leg numbness.  Stroke workup revealed  left thalamic infarct secondary to small vessel disease with multiple uncontrolled stroke risk factors.  CTA head/neck negative LVO, noted premature arthrosclerosis for age.  EF 60 to 65%.  LDL 175.  A1c 6.4.  Recommended DAPT for 3 weeks and aspirin  alone.  Reported significant intolerance to statin medications as well as Repatha , recommended consideration of Leqvio.  DM and HTN controlled on home regimen.  Current tobacco and THC use with  cessation counseling provided.  Therapies recommended outpatient PT/OT.   Today, 01/31/2024, patient is being seen for initial hospital follow-up unaccompanied.  Reports residual right-sided numbness, weakness and cognitive difficulty. Reports some improvement of symptoms since discharge. Had initial PT evaluation recently and has f/u visit 4/11.  Interested in pursuing OT due to difficulty with right hand fine motor control and dexterity.  She occasionally feels muscle spasms in her right arm.  PCP started Robaxin  which helped some.  She is also on pregabalin  for neuropathy.  Reports mild short-term memory difficulties since her stroke, will easily lose her train of thought during a conversation and can have delayed recall.  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  She has been on SSI prestroke for fibromyalgia, mood disorder and incontinence post surgical procedure. She did drive yesterday short distance and felt she did okay. She is able to maintain ADLs and majority of IADLs independently.  Denies new stroke/TIA symptoms.  PAIN:  Are you having pain? Yes: NPRS scale: 8/10 Pain location: right side  Pain description: spasms, aching Aggravating factors: unknown Relieving factors: none  FALLS: Has patient fallen in last 6 months?  No  LIVING ENVIRONMENT: Lives with: lives with an adult companion Lives in: House/apartment  PLOF:  Level of assistance: Independent with ADLs, Independent with IADLs Employment: On disability  PATIENT GOALS: maximize return to baseline   OBJECTIVE:  Note: Objective measures were completed at Evaluation unless otherwise noted.  COGNITION: Overall cognitive status: Impaired Areas of impairment:  Attention: Impaired: Sustained, Alternating, Divided Memory: Impaired: Immediate Short term Prospective Awareness: Impaired: Emergent  and Anticipatory Executive function: Impaired: Slow processing Functional deficits: Endorsed some difficulty with immediate recall if pressure present, challenges with multi-tasking, forgetting items daily, and getting distracted while watching TV/movie.   COGNITIVE COMMUNICATION: Following directions: Follows multi-step commands with increased time  Auditory comprehension: WFL Functional communication: Impaired: endorsed occasional stutter, slower rate of speech, and rare word finding episodes  ORAL MOTOR EXAMINATION: Overall status: WFL  STANDARDIZED ASSESSMENTS: CLQT: Attention: WNL, Memory: WNL, Executive Function: WNL, Language: WNL, Visuospatial Skills: WNL, and Clock Drawing: Mild  PATIENT REPORTED OUTCOME MEASURES (PROM): Not completed d/t time constraints                                                                                                                            TREATMENT DATE:  04/10/24:  04/03/24: Endorsed overall  improvements in cognitive communication, including quicker processing, less stuttering, and ability to multi-task easier. Alarm for medication has been helpful. Recommended reminders app for other medication given variations in times taken, as reminders app has ability to remind at later time. Able to set up reminder with rare min A. Demonstrates adequate problem solving for navigating potential return to work. Educated patient on attention strategies to manage internal distractions impacting sustained attention, with handout provided. Pt verbalized understanding and agreement with SLP recommendations. Conversational communication was WNL today.     03/27/24: Amber Rose reports success with am pm meds, forgetting sometimes 4:00 meds. Today targeted compensatory strategy of setting a daily alarm on her phone to recall 4:00 meds. Instructed her to hit snooze instead of stop. In the car, she is having difficulty following GPS and driving, missing exits and turns -  we reviewed strategy of eliminating distractions. She continues to forget why she went into a room. Trained in strategy of repeating to herself what she is going into the room for until she retrieves the item or completes the task. Targeted problem solving for financial management generating list of monthly bills, amounts, way its paid and due date with occasional min questioning cues.   02/23/24: ST evaluation and POC complete. Educated patient on parts of CLQT subtests with errors, including clock drawing and story recall. Was previously unaware of errors made during assessment. Provided recommendation to aid recall of medication management, including setting alarms/timers/reminders to support recall given inconsistent administration reported. Pt verbalized agreement.     PATIENT EDUCATION: Education details: see above Person educated: Patient Education method: Explanation Education comprehension: verbalized understanding and needs further education   GOALS: Goals reviewed with patient? Yes  SHORT TERM GOALS: Target date: 03/22/2024 (no ST tx visits completed before STG date)  Pt will successfully recall medication for 2/2 opportunities with use of external memory support  Baseline: Goal status: PARTIALLY MET  2.  Pt will carryover memory compensations to aid recall at home for 2/2 opportunities (ex: locating items) Baseline:  Goal status: NOT MET  3.  Pt will successfully utilize attention strategies to maintain attention for extended periods (I.e., show/movie) for 2/2 opportunities Baseline:  Goal status:  NOT MET   LONG TERM GOALS: Target date: 04/19/2024  Pt will successfully utilize cognitive compensations to manage iADLs with no errors reported >1 week  Baseline:  Goal status: ONGOING  2.  Pt will successfully complete 2-3 daily tasks with use of cognitive compensations > 1 week  Baseline:  Goal status: ONGOING  3.  Pt will maintain WNL communication x2 sessions given  rare min A  Baseline: 04/03/24 Goal status: ONGOING  4. Pt will improve self-rating of cognitive functioning by at least 1 point by LTG date  Baseline: self-rated 7/10  Goal Status: ONGOING  ASSESSMENT:  CLINICAL IMPRESSION: Patient is a 48 y.o. F who was seen today for ST tx s/p  stroke in March 2025. Continued education and instruction of cognitive strategies and compensations to maximize return to baseline functioning. Good carryover exhibited thus far. Pt would benefit from skilled ST intervention to maximize return to baseline and overall QoL.   OBJECTIVE IMPAIRMENTS: include attention, memory, awareness, executive functioning, and expressive language. These impairments are limiting patient from household responsibilities and effectively communicating at home and in community.Factors affecting potential to achieve goals and functional outcome are previous level of function.. Patient will benefit from skilled SLP services to address above impairments and improve overall function.  REHAB POTENTIAL: Good  PLAN:  SLP FREQUENCY: 1x/week  SLP DURATION: 8 weeks  PLANNED INTERVENTIONS: Language facilitation, Environmental controls, Cueing hierachy, Cognitive reorganization, Functional tasks, SLP instruction and feedback, Compensatory strategies, Patient/family education, [phone] Treatment of speech (30 or 45 min) , and [id number]- Speech Eval Sound Prod, Artic, Phon, Eval Compre, Express    [employer], CCC-SLP 04/10/2024, 11:15 AM

## 2024-04-12 ENCOUNTER — Ambulatory Visit (HOSPITAL_BASED_OUTPATIENT_CLINIC_OR_DEPARTMENT_OTHER)

## 2024-04-12 ENCOUNTER — Encounter: Admitting: Occupational Therapy

## 2024-04-12 IMAGING — CT CT ABD-PELV W/ CM
2 of 5 series · 16 of 46 positions shown, 18 images · IV contrast (OMNIPAQUE)
Comparison: CT abdomen pelvis dated 01/11/2020.

CLINICAL DATA: Left lower quadrant abdominal pain.

EXAM:
CT ABDOMEN AND PELVIS WITH CONTRAST
TECHNIQUE: Multidetector CT imaging of the abdomen and pelvis was performed
using the standard protocol following bolus administration of
intravenous contrast.

[Series 2: axial st · axial · 0.80mm/px · z∈[-406,-42]mm · 13 of 85 slices shown, 15 images]
[im 6/85  soft-tissue]
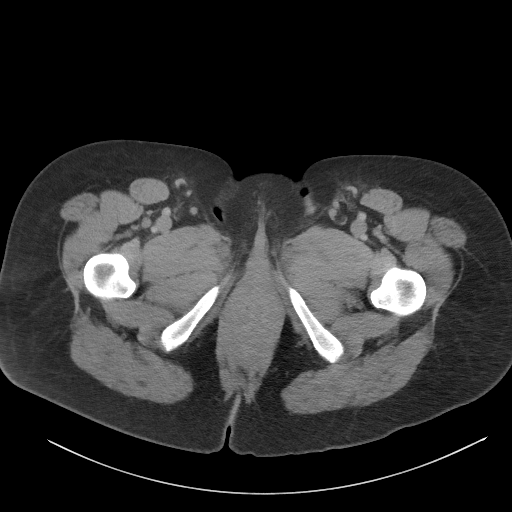
[im 6/85  bone]
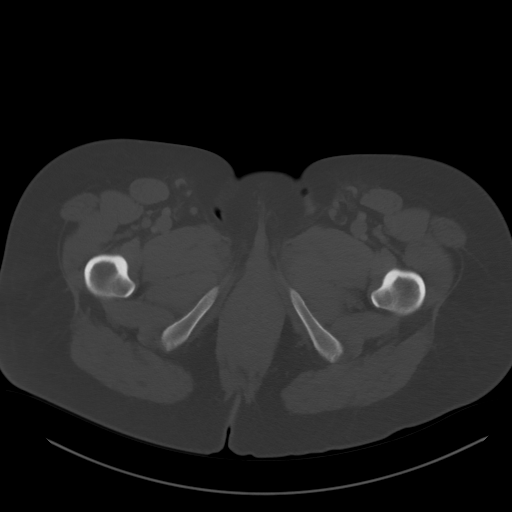
[im 12/85  soft-tissue]
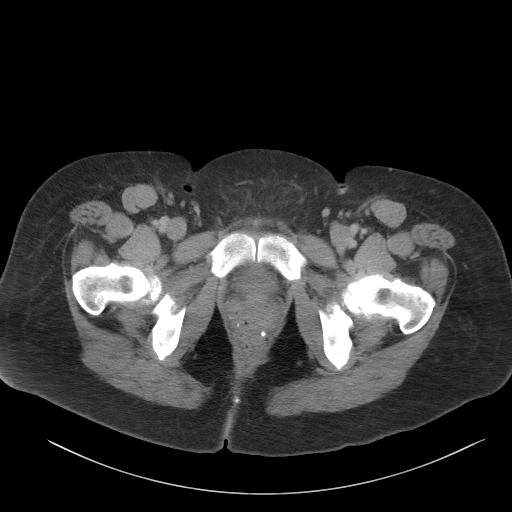
[im 17/85  soft-tissue]
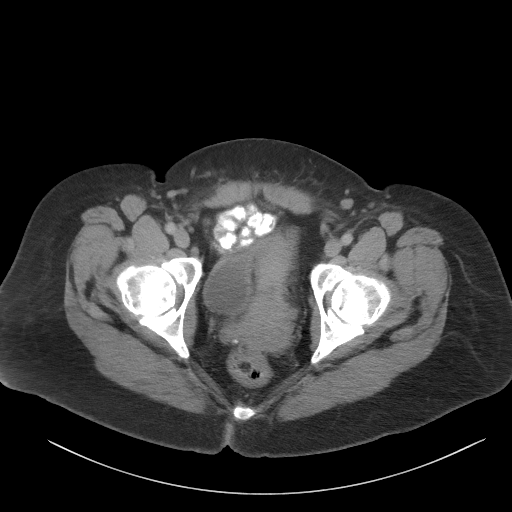
[im 23/85  soft-tissue]
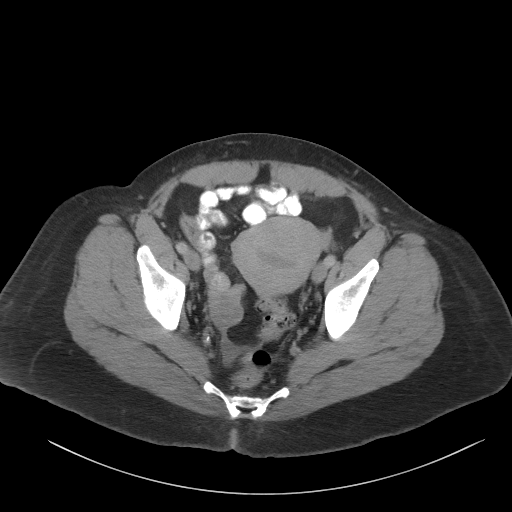
[im 29/85  soft-tissue]
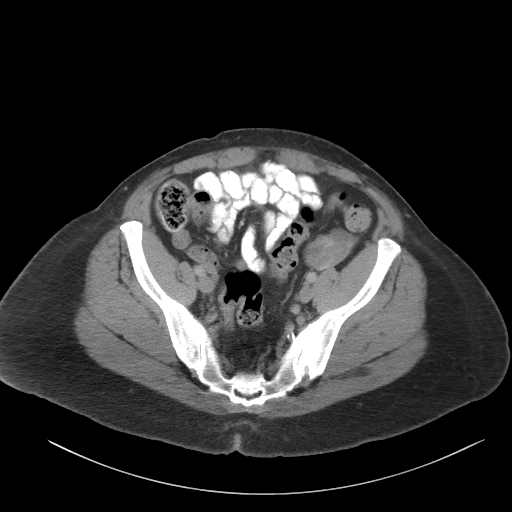
[im 34/85  soft-tissue]
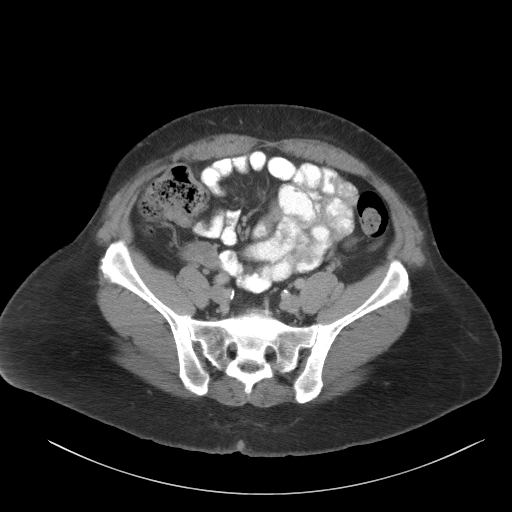
[im 45/85  soft-tissue]
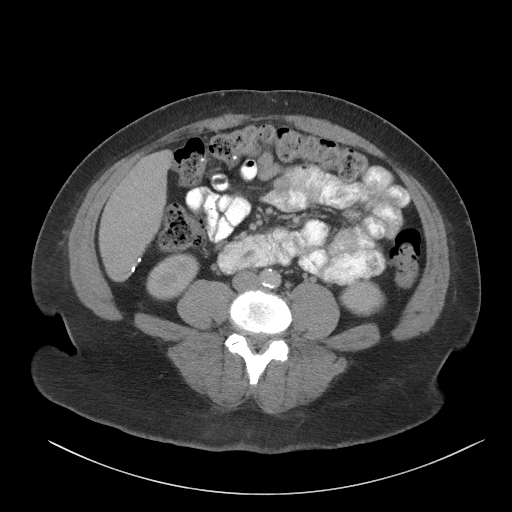
[im 51/85  soft-tissue]
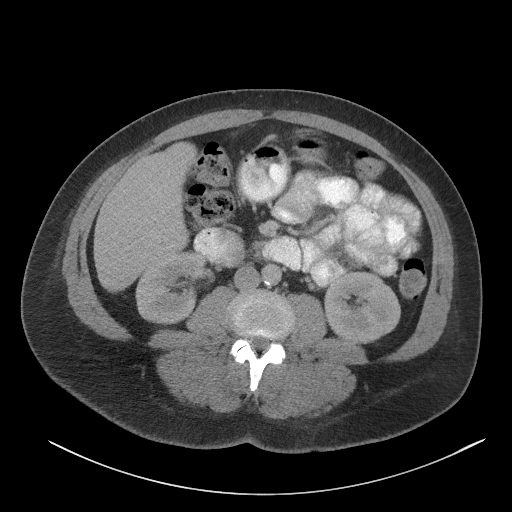
[im 57/85  soft-tissue]
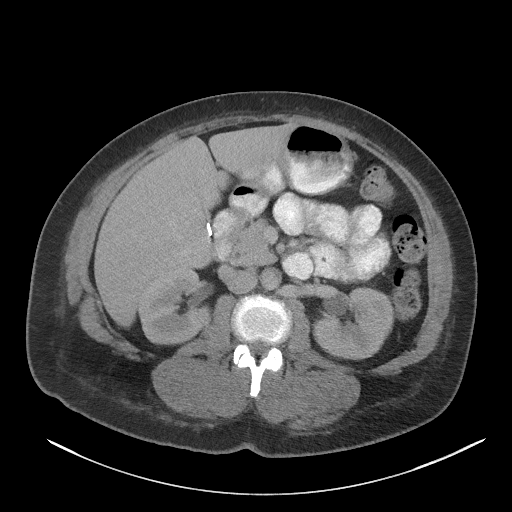
[im 57/85  bone]
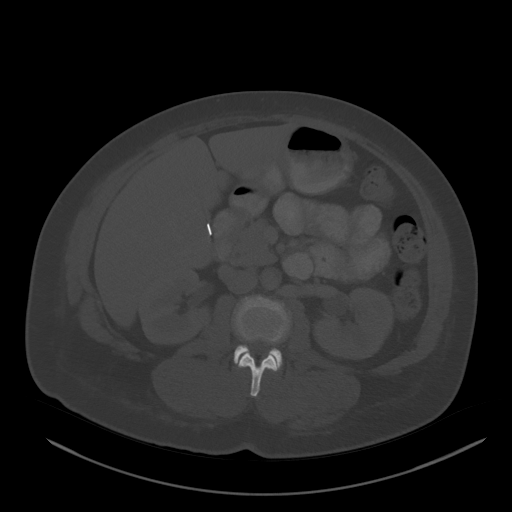
[im 62/85  soft-tissue]
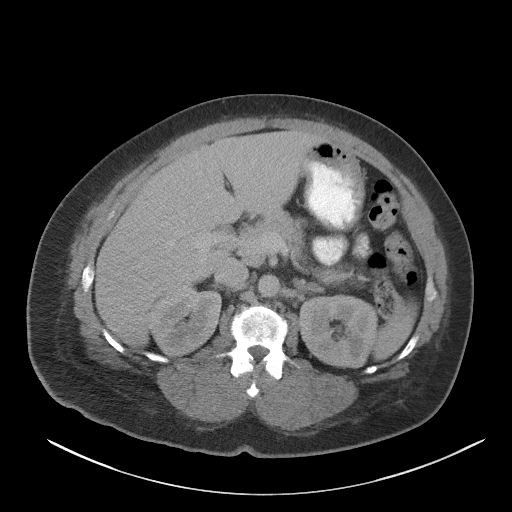
[im 68/85  soft-tissue]
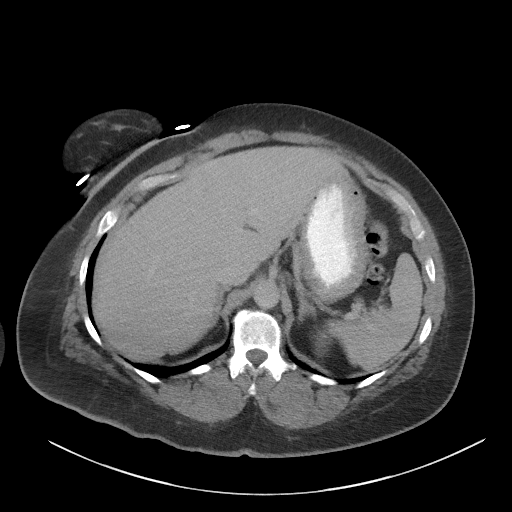
[im 73/85  soft-tissue]
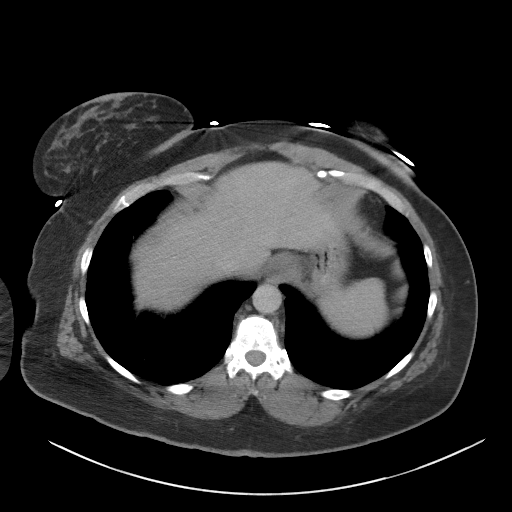
[im 79/85  soft-tissue]
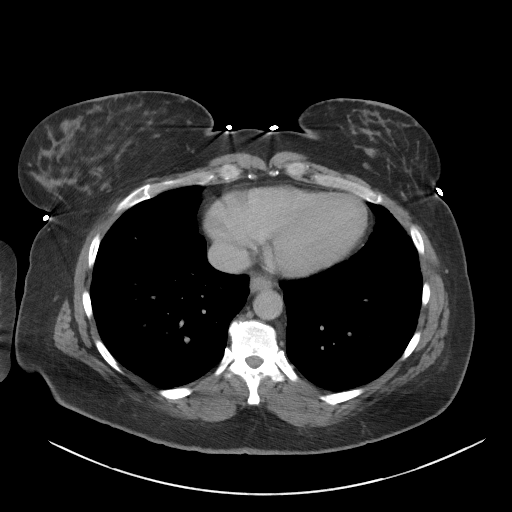

[Series 5: coronal st · coronal · 0.66mm/px · 3 of 101 slices shown]
[im 34/101  soft-tissue]
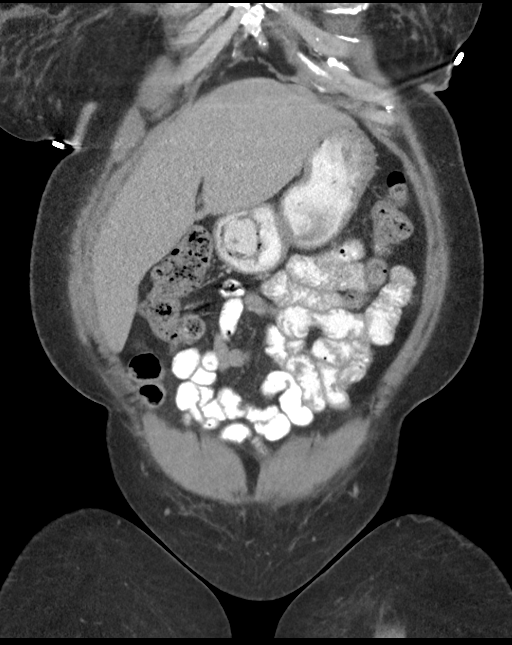
[im 45/101  soft-tissue]
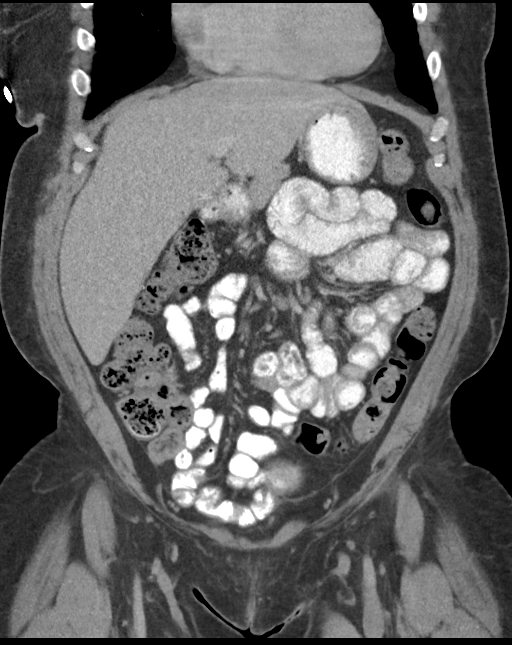
[im 56/101  soft-tissue]
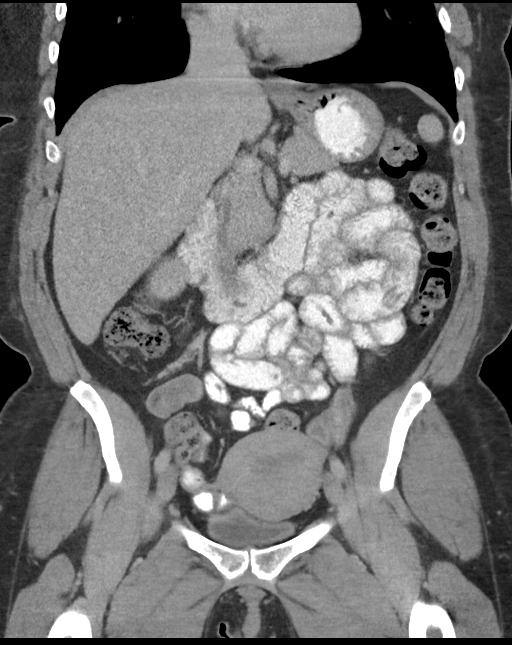

[16 of 46 positions shown; findings below may reference images not displayed]

RADIATION DOSE REDUCTION: This exam was performed according to the
departmental dose-optimization program which includes automated
exposure control, adjustment of the mA and/or kV according to
patient size and/or use of iterative reconstruction technique.

CONTRAST:  75mL OMNIPAQUE IOHEXOL 300 MG/ML  SOLN
FINDINGS: Lower chest: There is a thyroid nodule in the left lower lobe
([DATE]), present dating back to 02/28/2018. The visualized lung bases
are otherwise clear.

No intra-abdominal free air.  Small free fluid within the pelvis.

Hepatobiliary: The liver is unremarkable. No intrahepatic biliary
dilatation. Cholecystectomy.

Pancreas: Unremarkable. No pancreatic ductal dilatation or
surrounding inflammatory changes.

Spleen: Normal in size without focal abnormality.

Adrenals/Urinary Tract: The adrenal glands unremarkable. There is no
hydronephrosis on either side. There is symmetric enhancement and
excretion of contrast by both kidneys. The visualized ureters and
the urinary bladder appear unremarkable.

Stomach/Bowel: There is no bowel obstruction or active inflammation.
There is moderate stool throughout the colon. Appendectomy.

Vascular/Lymphatic: Mild aortoiliac atherosclerotic disease. The IVC
is unremarkable. No portal venous gas. There is no adenopathy.

Reproductive: The uterus is anteverted and grossly unremarkable.
There is a 2 cm left ovarian corpus luteum. The right ovary is
unremarkable.

Other: None

Musculoskeletal: Degenerative changes of the spine. No acute osseous
pathology.
IMPRESSION: 1. A 2 cm left ovarian corpus luteum.
2. No bowel obstruction.
3. Aortic Atherosclerosis ([id number]).

## 2024-04-13 ENCOUNTER — Encounter: Payer: Self-pay | Admitting: "Endocrinology

## 2024-04-13 ENCOUNTER — Ambulatory Visit: Payer: Self-pay | Admitting: Family Medicine

## 2024-04-13 ENCOUNTER — Ambulatory Visit (INDEPENDENT_AMBULATORY_CARE_PROVIDER_SITE_OTHER): Admitting: "Endocrinology

## 2024-04-13 VITALS — BP 120/80 | HR 84 | Ht 63.0 in | Wt 159.0 lb

## 2024-04-13 DIAGNOSIS — E11649 Type 2 diabetes mellitus with hypoglycemia without coma: Secondary | ICD-10-CM | POA: Diagnosis not present

## 2024-04-13 DIAGNOSIS — Z7984 Long term (current) use of oral hypoglycemic drugs: Secondary | ICD-10-CM

## 2024-04-13 DIAGNOSIS — Z794 Long term (current) use of insulin: Secondary | ICD-10-CM | POA: Diagnosis not present

## 2024-04-13 DIAGNOSIS — E119 Type 2 diabetes mellitus without complications: Secondary | ICD-10-CM | POA: Diagnosis not present

## 2024-04-13 DIAGNOSIS — Z7985 Long-term (current) use of injectable non-insulin antidiabetic drugs: Secondary | ICD-10-CM

## 2024-04-13 LAB — POCT GLYCOSYLATED HEMOGLOBIN (HGB A1C): Hemoglobin A1C: 6.7 % — AB (ref 4.0–5.6)

## 2024-04-13 MED ORDER — SEMAGLUTIDE (2 MG/DOSE) 8 MG/3ML ~~LOC~~ SOPN: 2.0000 mg | PEN_INJECTOR | SUBCUTANEOUS | 1 refills | Status: DC

## 2024-04-13 NOTE — Patient Instructions (Signed)
 Metformin  500mg  2 tabs bid Ozempic  2 mg per week  Humalog  scale: Use it 15 min before you eat: Space it out by 4 hours.  201 - 225: 3 units 226 - 250: 4 units 251 - 275: 5 units 276 - 300: 6 units 301 - 325: 7 units 326 - 350: 8 units 351 - 375: 9 units 376 - 400: 10 units

## 2024-04-13 NOTE — Progress Notes (Signed)
 Outpatient Endocrinology Note Jorge Newcomer, MD  04/13/24   Amber Rose [birth year] [id number]  Referring Provider: Joaquin Mulberry, MD Primary Care Provider: Joaquin Mulberry, MD Reason for consultation: Subjective   Assessment & Plan  Diagnoses and all orders for this visit:  Controlled type 2 diabetes mellitus without complication, with long-term current use of insulin  (HCC) -     POCT glycosylated hemoglobin (Hb A1C) -     Semaglutide , 2 MG/DOSE, 8 MG/3ML SOPN; Inject 2 mg as directed once a week.  Long term (current) use of oral hypoglycemic drugs  Long-term (current) use of injectable non-insulin  antidiabetic drugs  Long-term insulin  use (HCC)    Diabetes Type II complicated by hyperglycemia,  Lab Results  Component Value Date   GFR 106.09 02/20/2022   Hba1c goal less than 7, current Hba1c is  Lab Results  Component Value Date   HGBA1C 6.7 (A) 04/13/2024   Will recommend the following: Metformin  500mg  2 tabs bid Ozempic  2 mg per week  Humalog  scale: Use it 15 min before you eat: Space it out by 4 hours.  201 - 225: 3 units 226 - 250: 4 units 251 - 275: 5 units 276 - 300: 6 units 301 - 325: 7 units 326 - 350: 8 units 351 - 375: 9 units 376 - 400: 10 units   Ordered DM education previously   No known contraindications/side effects to any of above medications No history of MEN syndrome/medullary thyroid  cancer/pancreatitis or pancreatic cancer in self or family Stopped ozempic  2mg  3 mo ago due to weight loss-lost 30-40 lbs but now wants it again Stopped Lantus  14 units qpm   -Last LD and Tg are as follows: Lab Results  Component Value Date   LDLCALC 175 (H) 01/08/2024    Lab Results  Component Value Date   TRIG 165 (H) 01/08/2024   -not on statin, not interested after detailed discussion, says she has the shot approved, scared to take it-counseled pt -Follow low fat diet and exercise   -Blood pressure goal <140/90 -  Microalbumin/creatinine goal is < 30 -Last MA/Cr is as follows: Lab Results  Component Value Date   MICROALBUR 1.3 11/22/2023   -not on ACE/ARB  -diet changes including salt restriction -limit eating outside -counseled BP targets per standards of diabetes care -uncontrolled blood pressure can lead to retinopathy, nephropathy and cardiovascular and atherosclerotic heart disease  Reviewed and counseled on: -A1C target -Blood sugar targets -Complications of uncontrolled diabetes  -Checking blood sugar before meals and bedtime and bring log next visit -All medications with mechanism of action and side effects -Hypoglycemia management: rule of 15's, Glucagon  Emergency Kit and medical alert ID -low-carb low-fat plate-method diet -At least 20 minutes of physical activity per day -Annual dilated retinal eye exam and foot exam -compliance and follow up needs -follow up as scheduled or earlier if problem gets worse  Call if blood sugar is less than 70 or consistently above 250    Take a 15 gm snack of carbohydrate at bedtime before you go to sleep if your blood sugar is less than 100.    If you are going to fast after midnight for a test or procedure, ask your physician for instructions on how to reduce/decrease your insulin  dose.    Call if blood sugar is less than 70 or consistently above 250  -Treating a low sugar by rule of 15  (15 gms of sugar every 15 min until sugar is more than 70) If you  feel your sugar is low, test your sugar to be sure If your sugar is low (less than 70), then take 15 grams of a fast acting Carbohydrate (3-4 glucose tablets or glucose gel or 4 ounces of juice or regular soda) Recheck your sugar 15 min after treating low to make sure it is more than 70 If sugar is still less than 70, treat again with 15 grams of carbohydrate          Don't drive the hour of hypoglycemia  If unconscious/unable to eat or drink by mouth, use glucagon  injection or nasal spray  baqsimi  and call 911. Can repeat again in 15 min if still unconscious.  Return in about 6 months (around 10/13/2024).   I have reviewed current medications, nurse's notes, allergies, vital signs, past medical and surgical history, family medical history, and social history for this encounter. Counseled patient on symptoms, examination findings, lab findings, imaging results, treatment decisions and monitoring and prognosis. The patient understood the recommendations and agrees with the treatment plan. All questions regarding treatment plan were fully answered.  Jorge Newcomer, MD  04/13/24  History of Present Illness Amber Rose is a 48 y.o. year old female who presents for follow up of Type II diabetes mellitus.  Amber Rose was first diagnosed in around 2015.   Diabetes education +  Works at night 4 pm -7 am Sleeps [place]-5am, 8:30am-11:30am  8am is BF, lunch is around noon, dinner at 5:30pm and 9 pm is snack   Home diabetes regimen: Metformin  500mg  2 tabs bid Ozempic  1 mg per week   Stopped Lantus  18 units qpm Stopped ozempic  2mg  3 mo ago due to weight loss-lost 30-40 lbs   COMPLICATIONS -  MI/Stroke -  retinopathy -  neuropathy -  nephropathy   BLOOD SUGAR DATA CGM interpretation: At today's visit, we reviewed her CGM downloads. The full report is scanned in the media. Reviewing the CGM trends, BG are well controlled across the day.  Physical Exam  BP 120/80   Pulse 84   Ht 5' 3 (1.6 m)   Wt 159 lb (72.1 kg)   SpO2 97%   BMI 28.17 kg/m    Constitutional: well developed, well nourished Head: normocephalic, atraumatic Eyes: sclera anicteric, no redness Neck: supple Lungs: normal respiratory effort Neurology: alert and oriented Skin: dry, no appreciable rashes Musculoskeletal: no appreciable defects Psychiatric: normal mood and affect Diabetic Foot Exam - Simple   No data filed      Current Medications Patient's Medications  New Prescriptions    SEMAGLUTIDE , 2 MG/DOSE, 8 MG/3ML SOPN    Inject 2 mg as directed once a week.  Previous Medications   ALBUTEROL  (PROAIR  HFA) 108 (90 BASE) MCG/ACT INHALER    Inhale 2 puffs into the lungs every 4 (four) hours as needed for wheezing or shortness of breath.   ASPIRIN  EC 81 MG TABLET    Take 1 tablet (81 mg total) by mouth daily. Swallow whole.   BACLOFEN  (LIORESAL ) 10 MG TABLET    Take 1 tablet (10 mg total) by mouth 3 (three) times daily as needed for muscle spasms.   BLOOD GLUCOSE MONITORING SUPPL (ACCU-CHEK AVIVA PLUS) W/DEVICE KIT    USE AS DIRECTED DAILY. E11.9   BUPROPION  (WELLBUTRIN  XL) 150 MG 24 HR TABLET    Take 1 tablet (150 mg total) by mouth daily.   CONTINUOUS BLOOD GLUC RECEIVER (FREESTYLE LIBRE 2 READER) DEVI    Use to check blood sugar three times daily. E11.49  CONTINUOUS GLUCOSE SENSOR (FREESTYLE LIBRE 3 PLUS SENSOR) MISC    Inject 1 Device into the skin continuous. Change every 15 days   GLUCAGON  (BAQSIMI  ONE PACK) 3 MG/DOSE POWD    Place 1 Device into the nose as needed (Low blood sugar with impaired consciousness).   GLUCAGON  (GVOKE HYPOPEN  1-PACK) 1 MG/0.2ML SOAJ    Inject 1 mg into the skin as needed (low blood sugar with impaired consciousness).   GLUCOSE BLOOD (FREESTYLE TEST STRIPS) TEST STRIP    USE TO CHECK BLOOD SUGAR THREE TIMES DAILY. E11.49   HYDROXYZINE  (ATARAX ) 50 MG TABLET    Take 1 tablet (50 mg total) by mouth 3 (three) times daily as needed.   INDAPAMIDE  (LOZOL ) 1.25 MG TABLET    Take 1 tablet (1.25 mg total) by mouth daily.   INSULIN  GLARGINE (LANTUS  SOLOSTAR) 100 UNIT/ML SOLOSTAR PEN    Inject 25 Units into the skin daily. Increase by 2 units to a maximum daily dose of 30 units every 4th day until blood sugars are at goal   INSULIN  LISPRO (HUMALOG  KWIKPEN) 100 UNIT/ML KWIKPEN    Inject 1-15 Units into the skin 3 (three) times daily.   INSULIN  PEN NEEDLE 31G X 5 MM MISC    1 each by Does not apply route at bedtime.   INSULIN  PEN NEEDLE 32G X 4 MM MISC    1  Needle by Does not apply route 3 (three) times daily.   LANCET DEVICES (ACCU-CHEK SOFTCLIX) LANCETS    Use as instructed daily.   LANCETS (FREESTYLE) LANCETS    Use to check blood sugar three times daily. E11.49   LUPRON DEPOT, 73-MONTH, 11.25 MG INJECTION    Inject 11.25 mg into the muscle every 3 (three) months.   METFORMIN  (GLUCOPHAGE ) 500 MG TABLET    Take 2 tablets (1,000 mg total) by mouth 2 (two) times daily with a meal.   NICOTINE  (NICODERM CQ  - DOSED IN MG/24 HOURS) 21 MG/24HR PATCH    Place 1 patch (21 mg total) onto the skin daily.   NORETHINDRONE  (AYGESTIN ) 5 MG TABLET    TAKE 1 TABLET BY MOUTH THREE TIMES A DAY   OMEPRAZOLE  (PRILOSEC) 40 MG CAPSULE    TAKE 1 CAPSULE BY MOUTH EVERY  MORNING   PREGABALIN  (LYRICA ) 100 MG CAPSULE    Take 1 capsule (100 mg total) by mouth 2 (two) times daily.   SACCHAROMYCES BOULARDII (FLORASTOR) 250 MG CAPSULE    Take 1 capsule (250 mg total) by mouth 2 (two) times daily.   SPIRONOLACTONE  (ALDACTONE ) 50 MG TABLET    Take 1 tablet (50 mg total) by mouth daily.   VALACYCLOVIR  (VALTREX ) 500 MG TABLET    TAKE 1 TABLET (500 MG TOTAL) BY MOUTH 2 (TWO) TIMES DAILY. FOR HERPES PROPHYLAXIS  Modified Medications   No medications on file  Discontinued Medications   SEMAGLUTIDE , 1 MG/DOSE, (OZEMPIC , 1 MG/DOSE,) 4 MG/3ML SOPN    INJECT 1 MG ONCE A WEEK AS DIRECTED    Allergies Allergies  Allergen Reactions   Amlodipine      Leg pain, swelling around eyes    Atorvastatin  Other (See Comments)     Joint pain   Crestor  Flora.Frederickson ]     Joint pain    Irbesartan      Leg pain, swelling around eyes   Metronidazole  Nausea And Vomiting   Penicillins     childhood   Xanax [Alprazolam]     Caused upper respiratory symptoms per pt   Glyburide Other (  See Comments)    blood sugar dropped uncontrollably   Linagliptin Diarrhea and Other (See Comments)    Stomach pain, sinus infection   Moxifloxacin Diarrhea    Past Medical History Past Medical History:   Diagnosis Date   Abnormal Pap smear of cervix    yrs ago   Adenomyosis    Allergy     Anemia    Anxiety    Aortic atherosclerosis (HCC)    Arthritis    Asthma    Chronic UTI    Clostridium difficile infection    Depression    Diabetes mellitus without complication (HCC) 10/22/2023   A1C 8.8   Diverticulosis    Endometriosis    Fibroid    Fibromyalgia    GERD (gastroesophageal reflux disease)    Hiatal hernia    Hyperlipidemia    Hypertension    Internal hemorrhoids    Meningitis, viral    Migraines    Pancreatitis    Pure hypercholesterolemia 12/23/2020   Tubular adenoma of colon     Past Surgical History Past Surgical History:  Procedure Laterality Date   APPENDECTOMY  1990   CERVICAL BIOPSY  W/ LOOP ELECTRODE EXCISION     LEEP   CESAREAN SECTION  2002   CHOLECYSTECTOMY  2011   COLONOSCOPY     UPPER GASTROINTESTINAL ENDOSCOPY     URINARY SURGERY     urethra sling, removal, and then revision 2016 (4 surgeries)   WISDOM TOOTH EXTRACTION      Family History family history includes Cancer in her mother; Diabetes in her father; Heart attack in her maternal grandfather; Hypertension in her mother; Multiple sclerosis in her paternal grandmother.  Social History Social History   Socioeconomic History   Marital status: Single    Spouse name: Not on file   Number of children: 3   Years of education: Not on file   Highest education level: Not on file  Occupational History   Not on file  Tobacco Use   Smoking status: Former    Current packs/day: 0.10    Average packs/day: 0.1 packs/day for 20.0 years (2.0 ttl pk-yrs)    Types: Cigarettes   Smokeless tobacco: Never   Tobacco comments:    Patient is engaged in health coaching for smoking cessation as of 12/26/20. Patient stated that she has stopped smoking, drinking and any form of drug usage since her stroke.  Vaping Use   Vaping status: Never Used  Substance and Sexual Activity   Alcohol use: Not Currently     Comment: occ   Drug use: Not Currently    Types: Marijuana    Comment: occ   Sexual activity: Yes    Partners: Male    Birth control/protection: Surgical    Comment: BTL  Other Topics Concern   Not on file  Social History Narrative   Lives home with adult niece.  Not working.  Disability pending.  Pt is single.  Education GED.  3 children.    Social Drivers of Corporate investment banker Strain: Low Risk  (01/25/2024)   Overall Financial Resource Strain (CARDIA)    Difficulty of Paying Living Expenses: Not very hard  Food Insecurity: Food Insecurity Present (01/25/2024)   Hunger Vital Sign    Worried About Running Out of Food in the Last Year: Sometimes true    Ran Out of Food in the Last Year: Sometimes true  Transportation Needs: No Transportation Needs (01/25/2024)   PRAPARE - Transportation  Lack of Transportation (Medical): No    Lack of Transportation (Non-Medical): No  Physical Activity: Insufficiently Active (01/25/2024)   Exercise Vital Sign    Days of Exercise per Week: 3 days    Minutes of Exercise per Session: 30 min  Stress: Stress Concern Present (01/25/2024)   [employer] of Occupational Health - Occupational Stress Questionnaire    Feeling of Stress : Rather much  Social Connections: Socially Isolated (01/25/2024)   Social Connection and Isolation Panel    Frequency of Communication with Friends and Family: Three times a week    Frequency of Social Gatherings with Friends and Family: Once a week    Attends Religious Services: Never    Database administrator or Organizations: No    Attends Banker Meetings: Never    Marital Status: Never married  Intimate Partner Violence: Not At Risk (01/25/2024)   Humiliation, Afraid, Rape, and Kick questionnaire    Fear of Current or Ex-Partner: No    Emotionally Abused: No    Physically Abused: No    Sexually Abused: No    Lab Results  Component Value Date   HGBA1C 6.7 (A) 04/13/2024   HGBA1C 6.4 (H)  01/08/2024   HGBA1C 7.7 (H) 11/22/2023   Lab Results  Component Value Date   CHOL 231 (H) 01/08/2024   Lab Results  Component Value Date   HDL 23 (L) 01/08/2024   Lab Results  Component Value Date   LDLCALC 175 (H) 01/08/2024   Lab Results  Component Value Date   TRIG 165 (H) 01/08/2024   Lab Results  Component Value Date   CHOLHDL 10.0 01/08/2024   Lab Results  Component Value Date   CREATININE 1.15 (H) 03/28/2024   Lab Results  Component Value Date   GFR 106.09 02/20/2022   Lab Results  Component Value Date   MICROALBUR 1.3 11/22/2023      Component Value Date/Time   NA 134 03/28/2024 1325   K 4.1 03/28/2024 1325   CL 98 03/28/2024 1325   CO2 21 03/28/2024 1325   GLUCOSE 161 (H) 03/28/2024 1325   GLUCOSE 120 (H) 02/08/2024 1109   BUN 22 03/28/2024 1325   CREATININE 1.15 (H) 03/28/2024 1325   CREATININE 0.97 02/08/2024 1109   CREATININE 0.70 11/22/2023 0950   CALCIUM  9.6 03/28/2024 1325   PROT 7.7 02/08/2024 1109   PROT 6.6 01/20/2024 1630   ALBUMIN 4.3 02/08/2024 1109   ALBUMIN 4.1 01/20/2024 1630   AST 12 (L) 02/08/2024 1109   ALT 13 02/08/2024 1109   ALKPHOS 52 02/08/2024 1109   BILITOT 0.4 02/08/2024 1109   GFRNONAA >60 02/08/2024 1109   GFRNONAA >89 10/28/2016 0851   GFRAA 127 12/12/2020 1149   GFRAA >89 10/28/2016 0851      Latest Ref Rng & Units 03/28/2024    1:25 PM 03/08/2024    3:01 PM 02/08/2024    4:10 PM  BMP  Glucose 70 - 99 mg/dL 914  782  97   BUN 6 - 24 mg/dL 22  28  18    Creatinine 0.57 - 1.00 mg/dL 9.56  2.13  0.86   BUN/Creat Ratio 9 - 23 19  23  19    Sodium 134 - 144 mmol/L 134  138  137   Potassium 3.5 - 5.2 mmol/L 4.1  4.7  4.6   Chloride 96 - 106 mmol/L 98  99  100   CO2 20 - 29 mmol/L 21  21  23  Calcium  8.7 - 10.2 mg/dL 9.6  9.5  9.9        Component Value Date/Time   WBC 10.6 (H) 02/08/2024 1109   WBC 11.4 (H) 01/09/2024 0530   RBC 4.88 02/08/2024 1109   HGB 15.2 (H) 02/08/2024 1109   HGB 15.0 01/20/2024 1630    HCT 43.6 02/08/2024 1109   HCT 44.9 01/20/2024 1630   PLT 316 02/08/2024 1109   PLT 330 01/20/2024 1630   MCV 89.3 02/08/2024 1109   MCV 95 01/20/2024 1630   MCH 31.1 02/08/2024 1109   MCHC 34.9 02/08/2024 1109   RDW 13.7 02/08/2024 1109   RDW 12.7 01/20/2024 1630   LYMPHSABS 2.5 02/08/2024 1109   LYMPHSABS 2.5 01/20/2024 1630   MONOABS 0.6 02/08/2024 1109   EOSABS 0.1 02/08/2024 1109   EOSABS 0.1 01/20/2024 1630   BASOSABS 0.1 02/08/2024 1109   BASOSABS 0.1 01/20/2024 1630     Parts of this note may have been dictated using voice recognition software. There may be variances in spelling and vocabulary which are unintentional. Not all errors are proofread. Please notify the [facility] if any discrepancies are noted or if the meaning of any statement is not clear.

## 2024-04-14 ENCOUNTER — Telehealth: Payer: Self-pay | Admitting: Occupational Therapy

## 2024-04-14 ENCOUNTER — Encounter: Payer: Self-pay | Admitting: "Endocrinology

## 2024-04-14 NOTE — Telephone Encounter (Signed)
 This is to document my attempt to call patient after missed OT and ST appt this week.     Primary and Cell phone number(s) was used in efforts to contact the patient.   Spoke to patient to remind them of upcoming therapy visit Monday, June 23rd for PT/OT/ST beginning at 1145.  Pt aware and in agreement.

## 2024-04-17 ENCOUNTER — Ambulatory Visit: Admitting: Physical Therapy

## 2024-04-17 ENCOUNTER — Ambulatory Visit

## 2024-04-17 ENCOUNTER — Ambulatory Visit: Admitting: Occupational Therapy

## 2024-04-17 ENCOUNTER — Encounter: Payer: Self-pay | Admitting: Physical Therapy

## 2024-04-17 DIAGNOSIS — I69351 Hemiplegia and hemiparesis following cerebral infarction affecting right dominant side: Secondary | ICD-10-CM | POA: Diagnosis not present

## 2024-04-17 DIAGNOSIS — M6281 Muscle weakness (generalized): Secondary | ICD-10-CM

## 2024-04-17 DIAGNOSIS — R41841 Cognitive communication deficit: Secondary | ICD-10-CM

## 2024-04-17 DIAGNOSIS — R2689 Other abnormalities of gait and mobility: Secondary | ICD-10-CM

## 2024-04-17 DIAGNOSIS — R29898 Other symptoms and signs involving the musculoskeletal system: Secondary | ICD-10-CM | POA: Diagnosis not present

## 2024-04-17 DIAGNOSIS — R29818 Other symptoms and signs involving the nervous system: Secondary | ICD-10-CM | POA: Diagnosis not present

## 2024-04-17 DIAGNOSIS — R278 Other lack of coordination: Secondary | ICD-10-CM | POA: Diagnosis not present

## 2024-04-17 DIAGNOSIS — R208 Other disturbances of skin sensation: Secondary | ICD-10-CM

## 2024-04-17 DIAGNOSIS — R2681 Unsteadiness on feet: Secondary | ICD-10-CM | POA: Diagnosis not present

## 2024-04-17 NOTE — Therapy (Signed)
 OUTPATIENT OCCUPATIONAL THERAPY NEURO TREATMENT & DISCHARGE  Patient Name: Amber Rose MRN: [id number] DOB:[birth year], 48 y.o., female Today's Date: 04/17/2024  OCCUPATIONAL THERAPY DISCHARGE SUMMARY  Visits from Start of Care: 7  Current functional level related to goals / functional outcomes: Patient has met 1/2 short-term goals and 2/4 long-term goals to date.   Remaining deficits: Pt has paresthesias in RUE which persist but has HEP to continue remediation efforts    Education / Equipment: Continue with HEP following OT d/c   Patient agrees to discharge. Patient goals were partially met. Patient is being discharged due to being pleased with the current functional level.SABRA  PCP: Delbert Clam, MD  REFERRING PROVIDER: Whitfield Raisin, NP  END OF SESSION:  OT End of Session - 04/17/24 1239     Visit Number 7    Number of Visits 17    Date for OT Re-Evaluation 05/05/24    Authorization Type [employer] Medicare primary; [place] Medicaid secondary    OT Start Time 1238    OT Stop Time 1316    OT Time Calculation (min) 38 min    Activity Tolerance Patient tolerated treatment well    Behavior During Therapy WFL for tasks assessed/performed          Past Medical History:  Diagnosis Date   Abnormal Pap smear of cervix    yrs ago   Adenomyosis    Allergy     Anemia    Anxiety    Aortic atherosclerosis (HCC)    Arthritis    Asthma    Chronic UTI    Clostridium difficile infection    Depression    Diabetes mellitus without complication (HCC) 10/22/2023   A1C 8.8   Diverticulosis    Endometriosis    Fibroid    Fibromyalgia    GERD (gastroesophageal reflux disease)    Hiatal hernia    Hyperlipidemia    Hypertension    Internal hemorrhoids    Meningitis, viral    Migraines    Pancreatitis    Pure hypercholesterolemia 12/23/2020   Tubular adenoma of colon    Past Surgical History:  Procedure Laterality Date   APPENDECTOMY  1990   CERVICAL BIOPSY  W/ LOOP  ELECTRODE EXCISION     LEEP   CESAREAN SECTION  2002   CHOLECYSTECTOMY  2011   COLONOSCOPY     UPPER GASTROINTESTINAL ENDOSCOPY     URINARY SURGERY     urethra sling, removal, and then revision 2016 (4 surgeries)   WISDOM TOOTH EXTRACTION     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/2022   Steatorrhea 08/17/2022   Irregular menses 11/28/2021   Perimenopause 11/28/2021   Episode of recurrent major depressive disorder (HCC) 04/09/2021   BMI 33.0-33.9,adult 04/09/2021   Pure hypercholesterolemia 12/23/2020   Menorrhagia with regular cycle 09/13/2020   Well woman exam 09/13/2020   History of herpes zoster 01/17/2018   Overactive bladder 12/28/2017   Recurrent infections 11/19/2017   Cyst of nasal cavity 11/19/2017  Angio-edema 11/19/2017   Abdominal pain, epigastric 11/04/2017   Dysphagia 11/04/2017   Gastroesophageal reflux disease 07/12/2017   Degenerative disc disease at L5-S1 level 05/14/2017   Fibromyalgia 05/14/2017   ADD (attention deficit disorder) 05/14/2017   Urinary incontinence 04/05/2017   Thigh pain 02/02/2017   Anxiety and depression 02/02/2017   Tobacco abuse 02/02/2017   Facial flushing 02/02/2017   Hypertension 10/23/2016   Hidradenitis 10/23/2016   Diabetes (HCC) 09/12/2014   Lupus anticoagulant positive 07/03/2014   Vitamin D  deficiency 06/06/2014    ONSET DATE: 01/31/2024 (Date of referral)    REFERRING DIAG: I63.81 (ICD-10-CM) - Thalamic stroke (HCC) G81.91 (ICD-10-CM) - Right hemiparesis (HCC) R29.818 (ICD-10-CM) - Hemisensory deficit  THERAPY DIAG:  Muscle weakness (generalized)  Other lack of coordination  Other disturbances of skin sensation  Other symptoms and signs involving the nervous system  Other symptoms and signs involving the musculoskeletal system  Hemiplegia and hemiparesis following cerebral infarction affecting right dominant side (HCC)  Rationale for Evaluation and Treatment: Rehabilitation  SUBJECTIVE:   SUBJECTIVE STATEMENT: Pt reports she is doing really well. Has gotten used to RUE paresthesias. Has some difficulty with maneuvering pots and cast iron  pans. Requests OT d/c.  Pt accompanied by: self  PERTINENT HISTORY: PMH: HTN, anxiety, depression, fibromyalgia, herpes zoster, DM2, statin intolerance, thalamic CVA, CKD   PRECAUTIONS: Fall  WEIGHT BEARING RESTRICTIONS: No  PAIN:  Are you having pain? Yes: NPRS scale: 8/10 Pain location: R side of body (more severe from elbow to shoulder and headache with dizziness Pain description: buzzing Aggravating factors: movement Relieving factors: rest  FALLS: Has patient fallen in last 6 months? 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  HAND DOMINANCE: Right  ADLs: Eating: setup to cut food Grooming: mod I  UB Dressing: min A to fasten bra LB Dressing: mod I  Toileting: mod I Bathing: mod I Tub Shower transfers: mod I Equipment: Grab bars and handheld shower head  IADLs: Shopping: dependent Light housekeeping: dependent Meal Prep:  mod I for light meal prep; dependent for larger meals, which she would have cooked before her stroke Community mobility: dependent Medication management: mod I Financial management: mod I Handwriting: 25% legible and Increased time  MOBILITY STATUS: mod I  ACTIVITY TOLERANCE: Activity tolerance: fair  FUNCTIONAL OUTCOME MEASURES: PSFS: 2.7 total score  04/17/2024: 3.7 total score  Total score = sum of the activity scores/number of activities Minimum detectable change (90%CI) for average score = 2 points Minimum detectable change (90%CI) for single activity score = 3 points  UPPER EXTREMITY ROM:    BUE: WFL though bradykinesia with RUE and impaired opposition  UPPER EXTREMITY MMT:   Poor endurance with RUE testing  MMT Right (eval) Left (eval)  Shoulder flexion 3/5 WNL  Shoulder abduction 3/5 WNL  Elbow flexion 4-/5 WNL  Elbow extension 3/5 WNL  (Blank rows = not tested)  HAND FUNCTION: Grip strength: Right: 25.3 lbs; Left: 49.6 lbs  COORDINATION: 9 Hole Peg test: Right: 37 sec; Left: 24 sec  SENSATION: Light touch: Impaired   EDEMA: none reported or observed  MUSCLE TONE: WFL  COGNITION: Overall cognitive status: Within functional limits for tasks assessed  VISION:  Subjective report: no changes  VISION ASSESSMENT: WFL  PERCEPTION: Not tested  PRAXIS: Not tested  OBSERVATIONS: Pt appears well-kept.                                                                                                                            TODAY'S TREATMENT :    Objective measures assessed as noted in Goals section to determine progression towards goals.  Therapist reviewed goals with patient and updated patient progression.  No additional functional limitations identified.  Discussed strategies for maneuvering pots and cast iron  pans in kitchen with good safety including double oven mitts, sliding pots and pans from stove to sink, and using dish cloth in bottom of sink  for extra traction when cleaning.   PATIENT EDUCATION: Education details: OT d/c; goal progression; kitchen strategies Person educated: Patient Education method: Explanation, Demonstration, Tactile cues, and Verbal cues Education comprehension: verbalized understanding, returned demonstration, and verbal cues required  HOME EXERCISE PROGRAM: 03/06/2024 putty and coordination HEPs 03/13/24: altered sensory loss education/safety, desensitization techniques 04/03/2024: Additional desensitization  GOALS:  SHORT TERM GOALS: Target date: 03/31/2024   Patient will demonstrate initial R UE HEP with 25% verbal cues or less for proper execution. Baseline: Goal status: MET  2.  Pt will independently recall the 5 main sensory precautions (cold, heat, sharp, chemical, and heavy) as needed to prevent injury/harm secondary to impairments.   Baseline:  Goal status: IN PROGRESS  LONG TERM GOALS: Target date: 05/05/2024  Patient will demonstrate updated R UE HEP with visual handouts only for proper execution. Baseline:  Goal status: IN Progress  2.  Patient will demonstrate at least 35 lbs R grip strength as needed to open jars and other containers. Baseline: Right: 25.3 lbs 04/17/2024: 32.6, 35.9, 36.5 lbs avg. 35 lbs Goal status: MET  3.  Patient will demo improved FM coordination as evidenced by completing nine-hole peg with use of R in 30 seconds or less.  Baseline:  Right: 37 sec 04/17/2024: 40 Goal status: IN Progress  4.  Patient will report at least two-point increase in average PSFS score or at least three-point increase in a single activity score indicating functionally significant improvement given minimum detectable change.  Baseline: 2.7 total score (See above for individual activity scores) 04/17/2024: 3.7 total score Goal status: MET  ASSESSMENT:  CLINICAL IMPRESSION: Patient good goal progression though requests d/c despite not yet meeting all goals. Pt demonstrates sufficient  understanding of HEP and adaptive strategies as needed for further remediation efforts. PLAN:  OT d/c  CONSULTED AND AGREED WITH PLAN OF CARE: Patient   Jocelyn CHRISTELLA Bottom, OT 04/17/2024, 2:40 PM

## 2024-04-17 NOTE — Patient Instructions (Signed)
-   Side Stepping with Resistance at Thighs and Counter Support  - 1 x daily - 4 x weekly - 3 sets - 10 reps - Forward Backward Monster Walk with Band at Thighs and Counter Support  - 1 x daily - 4 x weekly - 3 sets - 10 reps - Squat with Counter Support  - 1 x daily - 4 x weekly - 2 sets - 10 reps

## 2024-04-17 NOTE — Therapy (Signed)
 OUTPATIENT SPEECH LANGUAGE PATHOLOGY TREATMENT (DISCHARGE)   Patient Name: Amber Rose MRN: [id number] DOB:Feb 17, 1976, 48 y.o., female Today's Date: 04/17/2024  PCP: Delbert Clam MD REFERRING PROVIDER: Whitfield Raisin, NP  END OF SESSION:  End of Session - 04/17/24 1219     Visit Number 4    Number of Visits 9    Date for SLP Re-Evaluation 04/19/24    Authorization Type [employer] Medicare    SLP Start Time 1315    SLP Stop Time  1345    SLP Time Calculation (min) 30 min    Activity Tolerance Patient tolerated treatment well          SPEECH THERAPY DISCHARGE SUMMARY  Visits from Start of Care: 4  Current functional level related to goals / functional outcomes: Pt reports overall improvements in cognitive functioning with increased functional independence and decreased cognitive errors with use of trained strategies.    Remaining deficits: Mild inattention (pt self-reported baseline attention deficits)   Education / Equipment: Cognitive compensations, external strategies, functional application    Patient agrees to discharge. Patient goals were partially met. Patient is being discharged due to being pleased with the current functional level.     Past Medical History:  Diagnosis Date   Abnormal Pap smear of cervix    yrs ago   Adenomyosis    Allergy     Anemia    Anxiety    Aortic atherosclerosis (HCC)    Arthritis    Asthma    Chronic UTI    Clostridium difficile infection    Depression    Diabetes mellitus without complication (HCC) 10/22/2023   A1C 8.8   Diverticulosis    Endometriosis    Fibroid    Fibromyalgia    GERD (gastroesophageal reflux disease)    Hiatal hernia    Hyperlipidemia    Hypertension    Internal hemorrhoids    Meningitis, viral    Migraines    Pancreatitis    Pure hypercholesterolemia 12/23/2020   Tubular adenoma of colon    Past Surgical History:  Procedure Laterality Date   APPENDECTOMY  1990   CERVICAL BIOPSY  W/  LOOP ELECTRODE EXCISION     LEEP   CESAREAN SECTION  2002   CHOLECYSTECTOMY  2011   COLONOSCOPY     UPPER GASTROINTESTINAL ENDOSCOPY     URINARY SURGERY     urethra sling, removal, and then revision 2016 (4 surgeries)   WISDOM TOOTH EXTRACTION     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/2022   Steatorrhea 08/17/2022   Irregular menses 11/28/2021   Perimenopause 11/28/2021   Episode of recurrent major depressive disorder (HCC) 04/09/2021   BMI 33.0-33.9,adult 04/09/2021   Pure hypercholesterolemia 12/23/2020   Menorrhagia with regular cycle 09/13/2020   Well woman exam 09/13/2020   History of herpes zoster 01/17/2018   Overactive bladder 12/28/2017   Recurrent infections 11/19/2017   Cyst of nasal cavity 11/19/2017   Angio-edema 11/19/2017   Abdominal pain, epigastric 11/04/2017  Dysphagia 11/04/2017   Gastroesophageal reflux disease 07/12/2017   Degenerative disc disease at L5-S1 level 05/14/2017   Fibromyalgia 05/14/2017   ADD (attention deficit disorder) 05/14/2017   Urinary incontinence 04/05/2017   Thigh pain 02/02/2017   Anxiety and depression 02/02/2017   Tobacco abuse 02/02/2017   Facial flushing 02/02/2017   Hypertension 10/23/2016   Hidradenitis 10/23/2016   Diabetes (HCC) 09/12/2014   Lupus anticoagulant positive 07/03/2014   Vitamin D  deficiency 06/06/2014    ONSET DATE: 01/31/2024 (referral date)    REFERRING DIAG: I63.81 (ICD-10-CM) - Thalamic stroke (HCC) I69.319 (ICD-10-CM) - Cognitive deficit, post-stroke  THERAPY DIAG: Cognitive communication deficit  Rationale for Evaluation and Treatment: Rehabilitation  SUBJECTIVE:   SUBJECTIVE STATEMENT: going well Pt accompanied by: self  PERTINENT HISTORY: [name] is a 48 y.o. female with history of DM2, GERD, HTN, HLD, and current tobacco smoking who presented to ED on 01/07/2024 with right face, arm, and leg numbness.  Stroke workup revealed left thalamic infarct secondary to small vessel disease with multiple uncontrolled stroke risk factors.  CTA head/neck negative LVO, noted premature arthrosclerosis for age.  EF 60 to 65%.  LDL 175.  A1c 6.4.  Recommended DAPT for 3 weeks and aspirin  alone.  Reported significant intolerance to statin medications as well as Repatha , recommended consideration of Leqvio.  DM and HTN controlled on home regimen.  Current tobacco and THC use with cessation counseling provided.  Therapies recommended outpatient PT/OT.   Today, 01/31/2024, patient is being seen for initial hospital follow-up unaccompanied.  Reports residual right-sided numbness, weakness and cognitive difficulty. Reports some improvement of symptoms since discharge. Had initial PT evaluation recently and has f/u visit 4/11.  Interested in pursuing OT due to difficulty with right hand fine motor control and dexterity.  She occasionally feels muscle spasms in her right arm.  PCP started Robaxin  which helped some.  She is also on pregabalin  for neuropathy.  Reports mild short-term memory difficulties since her stroke, will easily lose her train of thought during a conversation and can have delayed recall.  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  She has been on SSI prestroke for fibromyalgia, mood disorder and  incontinence post surgical procedure. She did drive yesterday short distance and felt she did okay. She is able to maintain ADLs and majority of IADLs independently.  Denies new stroke/TIA symptoms.  PAIN:  Are you having pain? Yes: NPRS scale: 8/10 Pain location: right side  Pain description: spasms, aching Aggravating factors: unknown Relieving factors: none  FALLS: Has patient fallen in last 6 months?  No  LIVING ENVIRONMENT: Lives with: lives with an adult companion Lives in: House/apartment  PLOF:  Level of assistance: Independent with ADLs, Independent with IADLs Employment: On disability  PATIENT GOALS: maximize return to baseline   OBJECTIVE:  Note: Objective measures were completed at Evaluation unless otherwise noted.  TREATMENT DATE:  04/17/24: Reported benefit of reminders/alarms on phone for medication management. Began using pill box for medication management, with recommendation for self-initiated double check prior to daughter double checking to maximize error awareness. Indicated successful return to prior tasks with rare mistakes. Provided additional education of strategies to optimize attention and task completion, with handout provided. Pt is pleased with current level of functioning and requested ST discharge this date.   04/03/24: Endorsed overall improvements in cognitive communication, including quicker processing, less stuttering, and ability to multi-task easier. Alarm for medication has been helpful. Recommended reminders app for other medication given variations in times taken, as reminders app has ability to remind at later time. Able to set up reminder with rare min A. Demonstrates adequate problem solving for navigating potential return to work. Educated patient on attention strategies to manage internal distractions impacting sustained  attention, with handout provided. Pt verbalized understanding and agreement with SLP recommendations. Conversational communication was WNL today.     03/27/24: Amber Rose reports success with am pm meds, forgetting sometimes 4:00 meds. Today targeted compensatory strategy of setting a daily alarm on her phone to recall 4:00 meds. Instructed her to hit snooze instead of stop. In the car, she is having difficulty following GPS and driving, missing exits and turns - we reviewed strategy of eliminating distractions. She continues to forget why she went into a room. Trained in strategy of repeating to herself what she is going into the room for until she retrieves the item or completes the task. Targeted problem solving for financial management generating list of monthly bills, amounts, way its paid and due date with occasional min questioning cues.   02/23/24: ST evaluation and POC complete. Educated patient on parts of CLQT subtests with errors, including clock drawing and story recall. Was previously unaware of errors made during assessment. Provided recommendation to aid recall of medication management, including setting alarms/timers/reminders to support recall given inconsistent administration reported. Pt verbalized agreement.     PATIENT EDUCATION: Education details: see above Person educated: Patient Education method: Explanation Education comprehension: verbalized understanding and needs further education   GOALS: Goals reviewed with patient? Yes  SHORT TERM GOALS: Target date: 03/22/2024 (no ST tx visits completed before STG date)  Pt will successfully recall medication for 2/2 opportunities with use of external memory support  Baseline: Goal status: PARTIALLY MET  2.  Pt will carryover memory compensations to aid recall at home for 2/2 opportunities (ex: locating items) Baseline:  Goal status: NOT MET  3.  Pt will successfully utilize attention strategies to maintain attention for  extended periods (I.e., show/movie) for 2/2 opportunities Baseline:  Goal status:  NOT MET   LONG TERM GOALS: Target date: 04/19/2024  Pt will successfully utilize cognitive compensations to manage iADLs with no errors reported >1 week  Baseline:  Goal status: MET  2.  Pt will successfully complete 2-3 daily tasks with use of cognitive compensations > 1 week  Baseline:  Goal status: MET  3.  Pt will maintain WNL communication x2 sessions given rare min A  Baseline: 04/03/24, 04/17/24 Goal status: MET  4. Pt will improve self-rating of cognitive functioning by at least 1 point by LTG date  Baseline: self-rated 7/10; 8/10  Goal Status: MET  ASSESSMENT:  CLINICAL IMPRESSION: Patient is a 48 y.o. F who was seen today for ST tx s/p  stroke in March 2025. Completed education and instruction of cognitive strategies and compensations to maximize return to baseline functioning. Good carryover  exhibited thus far. Pt requested ST discharge this date as pt is pleased with current level of functioning.   OBJECTIVE IMPAIRMENTS: include attention, memory, awareness, executive functioning, and expressive language. These impairments are limiting patient from household responsibilities and effectively communicating at home and in community.Factors affecting potential to achieve goals and functional outcome are previous level of function.. Patient will benefit from skilled SLP services to address above impairments and improve overall function.  REHAB POTENTIAL: Good  PLAN:  SLP FREQUENCY: 1x/week  SLP DURATION: 8 weeks  PLANNED INTERVENTIONS: Language facilitation, Environmental controls, Cueing hierachy, Cognitive reorganization, Functional tasks, SLP instruction and feedback, Compensatory strategies, Patient/family education, [phone] Treatment of speech (30 or 45 min) , and [id number]- Speech Eval Sound Prod, Artic, Phon, Eval Compre, Express    [employer], CCC-SLP 04/17/2024, 1:21  PM

## 2024-04-17 NOTE — Therapy (Signed)
 OUTPATIENT PHYSICAL THERAPY NEURO TREATMENT   Patient Name: Amber Rose MRN: [id number] DOB:[birth year], 48 y.o., female Today's Date: 04/17/2024   PCP: Delbert Clam, MD REFERRING PROVIDER: Danton Jon HERO, PA-C  END OF SESSION:  PT End of Session - 04/17/24 1152     Visit Number 10    Number of Visits 17   9 + 8   Date for PT Re-Evaluation 05/05/24   pushed out due to future multi-disciplinary and aquatic needs   Authorization Type [employer] dual complete    Progress Note Due on Visit 10    PT Start Time 1150    PT Stop Time 1230    PT Time Calculation (min) 40 min    Equipment Utilized During Treatment Gait belt    Activity Tolerance Patient tolerated treatment well    Behavior During Therapy WFL for tasks assessed/performed          Past Medical History:  Diagnosis Date   Abnormal Pap smear of cervix    yrs ago   Adenomyosis    Allergy     Anemia    Anxiety    Aortic atherosclerosis (HCC)    Arthritis    Asthma    Chronic UTI    Clostridium difficile infection    Depression    Diabetes mellitus without complication (HCC) 10/22/2023   A1C 8.8   Diverticulosis    Endometriosis    Fibroid    Fibromyalgia    GERD (gastroesophageal reflux disease)    Hiatal hernia    Hyperlipidemia    Hypertension    Internal hemorrhoids    Meningitis, viral    Migraines    Pancreatitis    Pure hypercholesterolemia 12/23/2020   Tubular adenoma of colon    Past Surgical History:  Procedure Laterality Date   APPENDECTOMY  1990   CERVICAL BIOPSY  W/ LOOP ELECTRODE EXCISION     LEEP   CESAREAN SECTION  2002   CHOLECYSTECTOMY  2011   COLONOSCOPY     UPPER GASTROINTESTINAL ENDOSCOPY     URINARY SURGERY     urethra sling, removal, and then revision 2016 (4 surgeries)   WISDOM TOOTH EXTRACTION     Patient Active Problem List   Diagnosis Date Noted   CVA (cerebral vascular accident) (HCC) 01/08/2024   Leukocytosis 01/08/2024   Type 2 diabetes mellitus with  chronic kidney disease, with long-term current use of insulin  (HCC) 01/08/2024   Hyperlipidemia 01/08/2024   GERD (gastroesophageal reflux disease) 01/08/2024   Overweight (BMI 25.0-29.9) 01/08/2024   Moderately severe major depression (HCC) 01/06/2024   Yeast vaginitis 08/30/2023   Iron  deficiency anemia 03/11/2023   PMDD (premenstrual dysphoric disorder) 11/17/2022   Abnormal uterine bleeding (AUB) 11/17/2022   Statin myopathy 09/23/2022   Steatorrhea 08/17/2022   Irregular menses 11/28/2021   Perimenopause 11/28/2021   Episode of recurrent major depressive disorder (HCC) 04/09/2021   BMI 33.0-33.9,adult 04/09/2021   Pure hypercholesterolemia 12/23/2020   Menorrhagia with regular cycle 09/13/2020   Well woman exam 09/13/2020   History of herpes zoster 01/17/2018   Overactive bladder 12/28/2017   Recurrent infections 11/19/2017   Cyst of nasal cavity 11/19/2017   Angio-edema 11/19/2017   Abdominal pain, epigastric 11/04/2017   Dysphagia 11/04/2017   Gastroesophageal reflux disease 07/12/2017   Degenerative disc disease at L5-S1 level 05/14/2017   Fibromyalgia 05/14/2017   ADD (attention deficit disorder) 05/14/2017   Urinary incontinence 04/05/2017   Thigh pain 02/02/2017   Anxiety and depression 02/02/2017   Tobacco abuse  02/02/2017   Facial flushing 02/02/2017   Hypertension 10/23/2016   Hidradenitis 10/23/2016   Diabetes (HCC) 09/12/2014   Lupus anticoagulant positive 07/03/2014   Vitamin D  deficiency 06/06/2014    ONSET DATE: 01/07/2024 (Lacunar CVA)  REFERRING DIAG: G81.91 (ICD-10-CM) - Right hemiparesis (HCC) I63.9 (ICD-10-CM) - Cerebrovascular accident (CVA), unspecified mechanism (HCC)  THERAPY DIAG:  Muscle weakness (generalized)  Other lack of coordination  Other disturbances of skin sensation  Other symptoms and signs involving the nervous system  Other symptoms and signs involving the musculoskeletal system  Other abnormalities of gait and  mobility  Unsteadiness on feet  Hemiplegia and hemiparesis following cerebral infarction affecting right dominant side (HCC)  Rationale for Evaluation and Treatment: Rehabilitation  SUBJECTIVE:                                                                                                                                                                                             SUBJECTIVE STATEMENT: Pt reports she has been working on building her own personal routine and returning to driving.  She reports feeling she has made great progress and she is returning to a job 2 nights a week.  She is feeling overwhelmed by the number of medical appts and therapies she still has with this return to her routine and she requests to cut frequency and keep aquatics. Pt accompanied by: self (drove herself)  PERTINENT HISTORY: HTN, anxiety, depression, fibromyalgia, herpes zoster, DM2, statin intolerance, thalamic CVA, CKD  PAIN:  Are you having pain? Yes: NPRS scale: 8-9 Pain location: right arm Pain description: tingling/cramping Aggravating factors: using the arm, laying on the right Relieving factors: sitting still  PRECAUTIONS: Fall  RED FLAGS: Bowel or bladder incontinence: Yes: urinary incontinence - wearing depends today and MD aware per report   WEIGHT BEARING RESTRICTIONS: No  FALLS: Has patient fallen in last 6 months? No  LIVING ENVIRONMENT: Lives with: lives with an adult companion Lives in: House/apartment Stairs: Yes: External: 3 steps; none Has following equipment at home: Grab bars  PLOF: Independent with gait, Independent with transfers, Needs assistance with ADLs, and Needs assistance with homemaking  PATIENT GOALS: Everything is really up top so I don't really know about PT.  OBJECTIVE:  Note: Objective measures were completed at Evaluation unless otherwise noted.  DIAGNOSTIC FINDINGS:  MR Brain 01/08/2024: IMPRESSION: Acute lacunar infarct in the left  thalamus.  COGNITION: Overall cognitive status: pt reports some difficulty with memory/losing her place with tasks due to focus   SENSATION: Light touch: WFL - pt reports decreased R > L  COORDINATION: LE RAMS;  impaired Heel-to-shin:  WNL  bilaterally  EDEMA:  WNL  MUSCLE TONE: Difficulty discerning flexion spasticity due to pt difficulty relaxing  POSTURE: rounded shoulders and forward head  LOWER EXTREMITY ROM:     Active  Right Eval Left Eval  Hip flexion WNL WNL  Hip extension    Hip abduction    Hip adduction    Hip internal rotation    Hip external rotation    Knee flexion    Knee extension    Ankle dorsiflexion    Ankle plantarflexion    Ankle inversion    Ankle eversion     (Blank rows = not tested)  LOWER EXTREMITY MMT:    MMT Right Eval Left Eval  Hip flexion 4-/5 4+/5  Hip extension    Hip abduction    Hip adduction    Hip internal rotation    Hip external rotation    Knee flexion 4-/5 4+/5  Knee extension 4/5 5/5  Ankle dorsiflexion 4+/5 5/5  Ankle plantarflexion    Ankle inversion    Ankle eversion    (Blank rows = not tested)  BED MOBILITY:  Pt sleeps in standard bed and requires increased time and momentum to get roll and has difficulty managing bedding  TRANSFERS: Assistive device utilized: None  Sit to stand: SBA Stand to sit: SBA Chair to chair: SBA  GAIT: Gait pattern: bilateral ER, step through pattern, decreased stride length, decreased hip/knee flexion- Right, decreased hip/knee flexion- Left, decreased ankle dorsiflexion- Right, decreased ankle dorsiflexion- Left, trendelenburg, and wide BOS Distance walked: various clinic distances Assistive device utilized: None Level of assistance: SBA Comments: Pt has severe drift from pathway to left then right when entering clinic, increased time to correct path with cuing.  Truncal ataxia?  FUNCTIONAL TESTS:  5 times sit to stand: 15.13 sec w/ light BUE support, severe  hyperextension in standing, mild weight shift left during sitting Timed up and go (TUG): 11.28 sec SBA, no instability in turning with small radius 10 meter walk test: 8.50 sec = 1.18 m/sec OR 3.88 ft/sec Functional gait assessment: To be assessed.  PATIENT SURVEYS:  None completed due to time.                                                                                                                              TREATMENT DATE: 04/17/2024  -Discussed POC alterations and going on hold until land appt prior to aquatics for re-cert.  Pt is agreeable to POC alterations discussed and made today.  Reprinted schedule w/ denotations of changes. -Supported squatting at Transport planner, discussed modifying depth and amount of support for increased/decreased challenge -Forward/backwards/lateral monster walk w/ red theraband 4x10 ft each direction mod I w/ unilateral support -Birddogs 2x8, improved coordination and core control -Seated on medium sized physioball for marching x20 > LAQ holds x20 w/ 2 sec hold > 4lb D1/D2 ball raise x10 each side  PATIENT EDUCATION: Education details:  BP monitoring at home/logging  if able.  Continue HEP (additions made 6/23) if BP allows - reminder of modifying based on safe limits.  See above for further. Person educated: Patient Education method: Explanation Education comprehension: verbalized understanding and needs further education  HOME EXERCISE PROGRAM: Access Code: [email or web address] URL: [email or web address] Date: 04/17/2024 Prepared by: Daved Bull  Exercises - Sit to Stand with Armchair  - 1 x daily - 7 x weekly - 3 sets - 10 reps - Corner Balance Feet Together With Eyes Open  - 1 x daily - 5 x weekly - 1 sets - 3-4 reps - 30 seconds hold - Corner Balance Feet Together With Eyes Closed  - 1 x daily - 5 x weekly - 1 sets - 3-4 reps - 30 seconds hold - Corner Balance Feet Together: Eyes Open With Head Turns  - 1 x daily - 5 x weekly - 1 sets  - 3-4 reps - 30 seconds hold - Romberg Stance with Head Nods  - 1 x daily - 5 x weekly - 2-3 sets - 10 reps - Tandem Walking with Counter Support  - 1 x daily - 5 x weekly - 3 sets - 10 reps - Side Stepping with Resistance at Thighs and Counter Support  - 1 x daily - 4 x weekly - 3 sets - 10 reps - Forward Backward Monster Walk with Band at Thighs and Counter Support  - 1 x daily - 4 x weekly - 3 sets - 10 reps - Squat with Counter Support  - 1 x daily - 4 x weekly - 2 sets - 10 reps  Chair yoga poses reviewed 5/7  GOALS: Goals reviewed with patient? Yes  SHORT TERM GOALS: Target date: 02/25/2024  Pt will be independent and compliant with initial strength and balance focused HEP in order to maintain functional progress and improve mobility. Baseline:  IND and compliant (5/7) Goal status: MET  2.  Pt will decrease 5xSTS to </=12 seconds w/ improved upright neutral in order to demonstrate decreased risk for falls and improved functional bilateral LE strength and power. Baseline: 15.13 sec w/ light BUE support and severe lumbar hyperextension on upright; 15.21 sec w/o UE support (5/7) Goal status: IN PROGRESS  LONG TERM GOALS: Target date: 03/24/2024  Pt will be independent and compliant with advanced and finalized strength and balance focused HEP in order to maintain functional progress and improve mobility. Baseline: Pt compliant and IND (5/28) Goal status: MET  2.  Pt will demonstrate a gait speed of >/=4.08 feet/sec in order to decrease risk for falls. Baseline: 3.88 ft/sec; 3.37 ft/sec (5/28) Goal status: NOT MET  3.  Pt will ambulate >/=500 feet with LRAD at no more than mod I level of assist over unlevel/compliant surfaces, curb step and ramp with improved trunk stability to promote household and community access. Baseline: instability over level ground/SBA; IND ramp/curb/500 ft over unlevel sidewalk and grass w/ only mild lateral wobble w/ transitions (5/28) Goal status: MET  4.   Pt will improve FGA score to >/=19/30 in order to demonstrate improved balance and decreased fall risk. Baseline: 13/30 (4/11); 25/30 (5/28) Goal status: MET  ASSESSMENT:  CLINICAL IMPRESSION: Patient's schedule altered per request to maintain aquatics and decrease frequency to allow pt to practice skills learned in PT.  She is making great progress in her daily tasks and return to PLOF.  Expanded her land-based HEP to improve dynamic stability challenge which she tolerated well this visit.  PT will complete a re-cert in  preparation for aquatics visits at 7/7 appt.  Continue per POC.  OBJECTIVE IMPAIRMENTS: Abnormal gait, decreased activity tolerance, decreased balance, decreased coordination, decreased knowledge of condition, decreased knowledge of use of DME, decreased strength, increased muscle spasms, impaired sensation, improper body mechanics, postural dysfunction, and pain.   ACTIVITY LIMITATIONS: carrying, lifting, bending, standing, squatting, stairs, transfers, bed mobility, continence, bathing, dressing, self feeding, reach over head, and locomotion level  PARTICIPATION LIMITATIONS: meal prep, cleaning, laundry, driving, community activity, and occupation  PERSONAL FACTORS: Fitness, Past/current experiences, Transportation, and 3+ comorbidities: HTN, fibromyalgia, DM2 are also affecting patient's functional outcome.   REHAB POTENTIAL: Good  CLINICAL DECISION MAKING: Evolving/moderate complexity  EVALUATION COMPLEXITY: Moderate  PLAN:  PT FREQUENCY: 1x/week + 2x/wk (1x in aquatics and 1x on land)  PT DURATION: 8 weeks + 4 wks  PLANNED INTERVENTIONS: [id number]- PT Re-evaluation, 97750- Physical Performance Testing, 97110-Therapeutic exercises, 97530- Therapeutic activity, [id number]- Neuromuscular re-education, 97535- Self Care, [id number]- Manual therapy, [id number]- Gait training, [id number]- Orthotic Initial, [phone]- Aquatic Therapy, [phone]- Electrical stimulation (manual), Patient/Family education,  Balance training, Stair training, Taping, Dry Needling, Joint mobilization, Spinal mobilization, Vestibular training, DME instructions, Cryotherapy, and Moist heat  PLAN FOR NEXT SESSION:  weighted vest w/ hurdles/dynamic stability tasks.  RLE NMR.  Blaze pods. R shoulder tightness.  7/7 - re-cert for aquatics only!  AQUATICS:  No longer planning for aquatic HEP per pt request 6/23, UE mobility/sensation and pain management, LE stretching and strengthening, general balance  Daved KATHEE Bull, PT, DPT 04/17/2024, 1:05 PM

## 2024-04-17 NOTE — Patient Instructions (Signed)
Strategies for Improving Your Attention and Memory  Use good eye-contact  Give the speaker your undivided attention  Look directly at the speaker  Complete one task at a time  Avoid multitasking  Complete one task before starting a new one  Write a note to yourself if you think of something else that needs to be done  Let others know when you need quiet time and can't be interrupted  Don't answer the phone, texts, or emails while you are working on another task  Put aside distracting thoughts  If you find your mind wandering, refocus your attention on the speaker  Avoid off-topic comments or responses that may divert your attention   If something important comes to mind, let the speaker know and pause to write yourself a note: "Do you mind holding on a minute, I have to write something down."  Put thoughts on hold and focus on salient information  Limit distractions in your environment  Think about the environment around you  Limit background noise by turning off the TV or music, putting your phone away  Close the door and work in quiet  Use active listening  Actively participate in the conversation to stay focused  Paraphrase what you have heard to include the most important details  Adding some associations may help you remember  Ask questions to clarify certain points  Summarize the speaker's comments periodically  Avoid nodding your head and using "mhm" responses as these are more passive and don't help your attention  Alert the other person/people  It may be helpful to alert your listener to the fact that you may need reminders to keep on track  Tell the speaker in advance that you may need to stop them and have them repeat salient information  If you lose focus, interject and let the person know, "I'm sorry, I lost you, can you tell me again?"  Write down information  Write down pertinent information as it comes up, such as telephone numbers, names  of people, addresses, details from appointments and conversations, etc.  

## 2024-04-18 ENCOUNTER — Telehealth: Payer: Self-pay | Admitting: Family Medicine

## 2024-04-18 NOTE — Telephone Encounter (Signed)
Contacted pt confirmed appt

## 2024-04-19 ENCOUNTER — Encounter: Payer: Self-pay | Admitting: Family Medicine

## 2024-04-19 ENCOUNTER — Ambulatory Visit: Attending: Family Medicine | Admitting: Family Medicine

## 2024-04-19 VITALS — BP 150/98 | HR 77 | Ht 63.0 in | Wt 158.2 lb

## 2024-04-19 DIAGNOSIS — E785 Hyperlipidemia, unspecified: Secondary | ICD-10-CM

## 2024-04-19 DIAGNOSIS — Z8673 Personal history of transient ischemic attack (TIA), and cerebral infarction without residual deficits: Secondary | ICD-10-CM

## 2024-04-19 DIAGNOSIS — N3941 Urge incontinence: Secondary | ICD-10-CM | POA: Diagnosis not present

## 2024-04-19 DIAGNOSIS — Z7984 Long term (current) use of oral hypoglycemic drugs: Secondary | ICD-10-CM

## 2024-04-19 DIAGNOSIS — E559 Vitamin D deficiency, unspecified: Secondary | ICD-10-CM | POA: Diagnosis not present

## 2024-04-19 DIAGNOSIS — I10 Essential (primary) hypertension: Secondary | ICD-10-CM | POA: Diagnosis not present

## 2024-04-19 DIAGNOSIS — I7090 Unspecified atherosclerosis: Secondary | ICD-10-CM | POA: Diagnosis not present

## 2024-04-19 DIAGNOSIS — E1149 Type 2 diabetes mellitus with other diabetic neurological complication: Secondary | ICD-10-CM | POA: Diagnosis not present

## 2024-04-19 DIAGNOSIS — Z7985 Long-term (current) use of injectable non-insulin antidiabetic drugs: Secondary | ICD-10-CM

## 2024-04-19 DIAGNOSIS — I69351 Hemiplegia and hemiparesis following cerebral infarction affecting right dominant side: Secondary | ICD-10-CM | POA: Diagnosis not present

## 2024-04-19 DIAGNOSIS — I152 Hypertension secondary to endocrine disorders: Secondary | ICD-10-CM

## 2024-04-19 DIAGNOSIS — E1169 Type 2 diabetes mellitus with other specified complication: Secondary | ICD-10-CM

## 2024-04-19 NOTE — Progress Notes (Signed)
 Subjective:  Patient ID: Amber Rose, female    DOB: June 07, 1976  Age: 48 y.o. MRN: [id number]  CC: Medical Management of Chronic Issues     Discussed the use of AI scribe software for clinical note transcription with the patient, who gave verbal consent to proceed.  History of Present Illness Amber Rose is a 48 year old female with a history of  type 2 diabetes mellitus , hypertension, depression and anxiety, hydradenitis, tobacco abuse, fibromyalgia, left thalamic infarct in 12/2023 who presents for follow-up regarding right-sided weakness and other post-stroke symptoms.  She experiences persistent right-sided weakness and sensory disturbances, describing her right side as feeling cold and like frostbite. Fine motor tasks are challenging, though she can perform larger movements. Her gait is affected, with a tendency to swing the right side, and she frequently burns her arm due to decreased sensation. She experiences numbness and twitching in her right eye, leading to headaches. Her neurology visit was in 01/2024. She is engaged in physical, occupational, and speech therapy, with a recent addition of aquatic physical therapy. She is reducing occupational and cognitive therapy but continues regular physical therapy.  Her current medications include Lyrica  100 mg, baclofen , and aspirin . Baclofen  is more effective than Robaxin  but causes drowsiness. Lyrica  does not have a significant effect.   She takes spironolactone  for blood pressure management and is managed by the cardiology advanced hypertension clinic.  She has been unable to tolerate statin due to myalgia for which I had referred her to the lipid clinic. She decided against taking Leqvio for cholesterol. Her diabetes is well-controlled and managed by endocrine with an A1c of 6.7.  She takes vitamin D  and B12 supplements, with a recent vitamin D  level of 28.  She reports worsening urinary incontinence, with numbness and spasms  after her stroke in the area, leading to increased use of incontinence supplies. She would like to see a specialist for these concerns.    Past Medical History:  Diagnosis Date   Abnormal Pap smear of cervix    yrs ago   Adenomyosis    Allergy     Anemia    Anxiety    Aortic atherosclerosis (HCC)    Arthritis    Asthma    Chronic UTI    Clostridium difficile infection    Depression    Diabetes mellitus without complication (HCC) 10/22/2023   A1C 8.8   Diverticulosis    Endometriosis    Fibroid    Fibromyalgia    GERD (gastroesophageal reflux disease)    Hiatal hernia    Hyperlipidemia    Hypertension    Internal hemorrhoids    Meningitis, viral    Migraines    Pancreatitis    Pure hypercholesterolemia 12/23/2020   Tubular adenoma of colon     Past Surgical History:  Procedure Laterality Date   APPENDECTOMY  1990   CERVICAL BIOPSY  W/ LOOP ELECTRODE EXCISION     LEEP   CESAREAN SECTION  2002   CHOLECYSTECTOMY  2011   COLONOSCOPY     UPPER GASTROINTESTINAL ENDOSCOPY     URINARY SURGERY     urethra sling, removal, and then revision 2016 (4 surgeries)   WISDOM TOOTH EXTRACTION      Family History  Problem Relation Age of Onset   Cancer Mother        cervical, breast, lung, skin, bladder-ex smoker   Hypertension Mother    Diabetes Father    Heart attack Maternal Grandfather    Multiple  sclerosis Paternal Grandmother     Social History   Socioeconomic History   Marital status: Single    Spouse name: Not on file   Number of children: 3   Years of education: Not on file   Highest education level: Not on file  Occupational History   Not on file  Tobacco Use   Smoking status: Former    Current packs/day: 0.10    Average packs/day: 0.1 packs/day for 20.0 years (2.0 ttl pk-yrs)    Types: Cigarettes   Smokeless tobacco: Never   Tobacco comments:    Patient is engaged in health coaching for smoking cessation as of 12/26/20. Patient stated that she has  stopped smoking, drinking and any form of drug usage since her stroke.  Vaping Use   Vaping status: Never Used  Substance and Sexual Activity   Alcohol use: Not Currently    Comment: occ   Drug use: Not Currently    Types: Marijuana    Comment: occ   Sexual activity: Yes    Partners: Male    Birth control/protection: Surgical    Comment: BTL  Other Topics Concern   Not on file  Social History Narrative   Lives home with adult niece.  Not working.  Disability pending.  Pt is single.  Education GED.  3 children.    Social Drivers of Corporate investment banker Strain: Low Risk  (01/25/2024)   Overall Financial Resource Strain (CARDIA)    Difficulty of Paying Living Expenses: Not very hard  Food Insecurity: Food Insecurity Present (01/25/2024)   Hunger Vital Sign    Worried About Running Out of Food in the Last Year: Sometimes true    Ran Out of Food in the Last Year: Sometimes true  Transportation Needs: No Transportation Needs (01/25/2024)   PRAPARE - Administrator, Civil Service (Medical): No    Lack of Transportation (Non-Medical): No  Physical Activity: Insufficiently Active (01/25/2024)   Exercise Vital Sign    Days of Exercise per Week: 3 days    Minutes of Exercise per Session: 30 min  Stress: Stress Concern Present (01/25/2024)   [employer] of Occupational Health - Occupational Stress Questionnaire    Feeling of Stress : Rather much  Social Connections: Socially Isolated (01/25/2024)   Social Connection and Isolation Panel    Frequency of Communication with Friends and Family: Three times a week    Frequency of Social Gatherings with Friends and Family: Once a week    Attends Religious Services: Never    Database administrator or Organizations: No    Attends Engineer, structural: Never    Marital Status: Never married    Allergies  Allergen Reactions   Amlodipine      Leg pain, swelling around eyes    Atorvastatin  Other (See Comments)      Joint pain   Crestor  [Rosuvastatin ]     Joint pain    Irbesartan      Leg pain, swelling around eyes   Metronidazole  Nausea And Vomiting   Penicillins     childhood   Xanax [Alprazolam]     Caused upper respiratory symptoms per pt   Glyburide Other (See Comments)    blood sugar dropped uncontrollably   Linagliptin Diarrhea and Other (See Comments)    Stomach pain, sinus infection   Moxifloxacin Diarrhea    Outpatient Medications Prior to Visit  Medication Sig Dispense Refill   albuterol  (PROAIR  HFA) 108 (90 Base) MCG/ACT inhaler  Inhale 2 puffs into the lungs every 4 (four) hours as needed for wheezing or shortness of breath. 3 Inhaler 0   aspirin  EC 81 MG tablet Take 1 tablet (81 mg total) by mouth daily. Swallow whole. 30 tablet 12   baclofen  (LIORESAL ) 10 MG tablet Take 1 tablet (10 mg total) by mouth 3 (three) times daily as needed for muscle spasms. 90 each 11   Blood Glucose Monitoring Suppl (ACCU-CHEK AVIVA PLUS) w/Device KIT USE AS DIRECTED DAILY. E11.9 1 kit 0   buPROPion  (WELLBUTRIN  XL) 150 MG 24 hr tablet Take 1 tablet (150 mg total) by mouth daily. 90 tablet 1   Continuous Blood Gluc Receiver (FREESTYLE LIBRE 2 READER) DEVI Use to check blood sugar three times daily. E11.49 1 each 0   Continuous Glucose Sensor (FREESTYLE LIBRE 3 PLUS SENSOR) MISC Inject 1 Device into the skin continuous. Change every 15 days 6 each 3   Glucagon  (BAQSIMI  ONE PACK) 3 MG/DOSE POWD Place 1 Device into the nose as needed (Low blood sugar with impaired consciousness). 2 each 3   Glucagon  (GVOKE HYPOPEN  1-PACK) 1 MG/0.2ML SOAJ Inject 1 mg into the skin as needed (low blood sugar with impaired consciousness). 0.4 mL 2   glucose blood (FREESTYLE TEST STRIPS) test strip USE TO CHECK BLOOD SUGAR THREE TIMES DAILY. E11.49 100 strip 6   hydrOXYzine  (ATARAX ) 50 MG tablet Take 1 tablet (50 mg total) by mouth 3 (three) times daily as needed. (Patient taking differently: Take 50 mg by mouth at bedtime as  needed (for sleep).) 30 tablet 5   indapamide  (LOZOL ) 1.25 MG tablet Take 1 tablet (1.25 mg total) by mouth daily. 90 tablet 3   insulin  glargine (LANTUS  SOLOSTAR) 100 UNIT/ML Solostar Pen Inject 25 Units into the skin daily. Increase by 2 units to a maximum daily dose of 30 units every 4th day until blood sugars are at goal 15 mL 6   insulin  lispro (HUMALOG  KWIKPEN) 100 UNIT/ML KwikPen Inject 1-15 Units into the skin 3 (three) times daily. 43 mL 1   Insulin  Pen Needle 31G X 5 MM MISC 1 each by Does not apply route at bedtime. 30 each 5   Insulin  Pen Needle 32G X 4 MM MISC 1 Needle by Does not apply route 3 (three) times daily. 200 each 5   Lancet Devices (ACCU-CHEK SOFTCLIX) lancets Use as instructed daily. 1 each 5   Lancets (FREESTYLE) lancets Use to check blood sugar three times daily. E11.49 100 each 3   LUPRON DEPOT, 25-MONTH, 11.25 MG injection Inject 11.25 mg into the muscle every 3 (three) months.     metFORMIN  (GLUCOPHAGE ) 500 MG tablet Take 2 tablets (1,000 mg total) by mouth 2 (two) times daily with a meal. 120 tablet 4   nicotine  (NICODERM CQ  - DOSED IN MG/24 HOURS) 21 mg/24hr patch Place 1 patch (21 mg total) onto the skin daily. 28 patch 0   norethindrone  (AYGESTIN ) 5 MG tablet TAKE 1 TABLET BY MOUTH THREE TIMES A DAY 252 tablet 2   omeprazole  (PRILOSEC) 40 MG capsule TAKE 1 CAPSULE BY MOUTH EVERY  MORNING 100 capsule 0   pregabalin  (LYRICA ) 100 MG capsule Take 1 capsule (100 mg total) by mouth 2 (two) times daily. 60 capsule 5   saccharomyces boulardii (FLORASTOR) 250 MG capsule Take 1 capsule (250 mg total) by mouth 2 (two) times daily. 60 capsule 0   Semaglutide , 2 MG/DOSE, 8 MG/3ML SOPN Inject 2 mg as directed once a week. 9 mL  1   spironolactone  (ALDACTONE ) 50 MG tablet Take 1 tablet (50 mg total) by mouth daily. 90 tablet 3   valACYclovir  (VALTREX ) 500 MG tablet TAKE 1 TABLET (500 MG TOTAL) BY MOUTH 2 (TWO) TIMES DAILY. FOR HERPES PROPHYLAXIS 90 tablet 1   No  facility-administered medications prior to visit.     ROS Review of Systems  Constitutional:  Negative for activity change and appetite change.  HENT:  Negative for sinus pressure and sore throat.   Respiratory:  Negative for chest tightness, shortness of breath and wheezing.   Cardiovascular:  Negative for chest pain and palpitations.  Gastrointestinal:  Negative for abdominal distention, abdominal pain and constipation.  Genitourinary: Negative.   Musculoskeletal: Negative.   Neurological:  Positive for weakness.  Psychiatric/Behavioral:  Negative for behavioral problems and dysphoric mood.     Objective:  BP (!) 150/98   Pulse 77   Ht 5' 3 (1.6 m)   Wt 158 lb 3.2 oz (71.8 kg)   SpO2 98%   BMI 28.02 kg/m      04/19/2024   11:34 AM 04/19/2024   11:02 AM 04/13/2024    1:54 PM  BP/Weight  Systolic BP 150 138 120  Diastolic BP 98 93 80  Wt. (Lbs)  158.2 159  BMI  28.02 kg/m2 28.17 kg/m2      Physical Exam Constitutional:      Appearance: She is well-developed.   Cardiovascular:     Rate and Rhythm: Normal rate.     Heart sounds: Normal heart sounds. No murmur heard. Pulmonary:     Effort: Pulmonary effort is normal.     Breath sounds: Normal breath sounds. No wheezing or rales.  Chest:     Chest wall: No tenderness.  Abdominal:     General: Bowel sounds are normal. There is no distension.     Palpations: Abdomen is soft. There is no mass.     Tenderness: There is no abdominal tenderness.   Musculoskeletal:        General: Normal range of motion.     Right lower leg: No edema.     Left lower leg: No edema.     Comments: Strength in right upper extremity and right lower extremity-3/5 Strength in left upper and left lower extremity-5/5   Neurological:     Mental Status: She is alert and oriented to person, place, and time.   Psychiatric:        Mood and Affect: Affect is flat.        Latest Ref Rng & Units 03/28/2024    1:25 PM 03/08/2024    3:01 PM  02/08/2024    4:10 PM  CMP  Glucose 70 - 99 mg/dL 838  787  97   BUN 6 - 24 mg/dL 22  28  18    Creatinine 0.57 - 1.00 mg/dL 8.84  8.77  9.05   Sodium 134 - 144 mmol/L 134  138  137   Potassium 3.5 - 5.2 mmol/L 4.1  4.7  4.6   Chloride 96 - 106 mmol/L 98  99  100   CO2 20 - 29 mmol/L 21  21  23    Calcium  8.7 - 10.2 mg/dL 9.6  9.5  9.9     Lipid Panel     Component Value Date/Time   CHOL 231 (H) 01/08/2024 0730   CHOL 213 (H) 12/01/2022 0955   TRIG 165 (H) 01/08/2024 0730   HDL 23 (L) 01/08/2024 0730   HDL 43  12/01/2022 0955   CHOLHDL 10.0 01/08/2024 0730   VLDL 33 01/08/2024 0730   LDLCALC 175 (H) 01/08/2024 0730   LDLCALC 184 (H) 11/22/2023 0950   LDLDIRECT 51 08/14/2022 1431    CBC    Component Value Date/Time   WBC 10.6 (H) 02/08/2024 1109   WBC 11.4 (H) 01/09/2024 0530   RBC 4.88 02/08/2024 1109   HGB 15.2 (H) 02/08/2024 1109   HGB 15.0 01/20/2024 1630   HCT 43.6 02/08/2024 1109   HCT 44.9 01/20/2024 1630   PLT 316 02/08/2024 1109   PLT 330 01/20/2024 1630   MCV 89.3 02/08/2024 1109   MCV 95 01/20/2024 1630   MCH 31.1 02/08/2024 1109   MCHC 34.9 02/08/2024 1109   RDW 13.7 02/08/2024 1109   RDW 12.7 01/20/2024 1630   LYMPHSABS 2.5 02/08/2024 1109   LYMPHSABS 2.5 01/20/2024 1630   MONOABS 0.6 02/08/2024 1109   EOSABS 0.1 02/08/2024 1109   EOSABS 0.1 01/20/2024 1630   BASOSABS 0.1 02/08/2024 1109   BASOSABS 0.1 01/20/2024 1630    Lab Results  Component Value Date   HGBA1C 6.7 (A) 04/13/2024      .1. Urge incontinence of urine (Primary) This has worsened since her stroke Will order incontinence supplies - Ambulatory referral to Urogynecology  2. Type 2 diabetes mellitus with other neurologic complication, without long-term current use of insulin  (HCC) Controlled with A1c of 6.7 Continue current regimen per endocrine  3. Hypertension associated with diabetes (HCC) Uncontrolled She has intolerance to multiple antihypertensives Currently on  spironolactone  and she does take half of her indapamide  Advised to follow-up with the cardiology advanced hypertension clinic  4. 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 Hemiparesis affecting right side as late effect of cerebrovascular accident (CVA) (HCC) Left thalamic infarct with right hemiparesis We have discussed the need to decrease secondary risk factors including hyperlipidemia, diabetes, blood pressure, smoking cessation She will strongly benefit from lipid lowering therapy but at this time she is opposed to taking PCSK9 inhibitors Continue PT/OT/aquatic therapy  6. Vitamin D  deficiency - VITAMIN D  25 Hydroxy (Vit-D Deficiency, Fractures)  7. Atherosclerosis CT angio of the head revealed premature atherosclerosis for age Discussed this in the light of her recent stroke Statin therapy will be beneficial  8. History of stroke With right hemiparesis See #5 above   No orders of the defined types were placed in this encounter.   Follow-up: Return in about 6 months (around 10/19/2024) for Chronic medical conditions.       Corrina Sabin, MD, FAAFP. [facility] [place], [place] [phone]   04/19/2024, 12:58 PM

## 2024-04-19 NOTE — Patient Instructions (Signed)
 VISIT SUMMARY:  Today, we discussed your ongoing symptoms and management following your stroke, including right-sided weakness, sensory disturbances, and other post-stroke symptoms. We also reviewed your conditions related to atherosclerosis, hypertension, urinary incontinence, and vitamin D  deficiency.  YOUR PLAN:  -STROKE WITH RIGHT-SIDED WEAKNESS: You have persistent weakness, numbness, and dexterity issues on your right side due to your stroke. We will continue your physical, occupational, and speech therapy. Baclofen  will be continued for muscle spasms, but we will evaluate the need for Lyrica .  -ATHEROSCLEROSIS: Atherosclerosis is the buildup of fats, cholesterol, and other substances in and on your artery walls. We will consider starting Lescol for cholesterol management and continue to monitor your blood pressure and diabetes. We also discussed ways to manage your cholesterol to reduce the risk of another stroke.  -HYPERTENSION: Hypertension is high blood pressure. Your blood pressure has improved, but the diastolic pressure remains slightly elevated. We will continue with spironolactone  and monitor your blood pressure regularly.  -URINARY INCONTINENCE: Urinary incontinence is the loss of bladder control. Your incontinence has worsened, and we will refer you to a urogynecologist for further evaluation. We will also coordinate with your insurance for coverage of incontinence supplies.  -VITAMIN D  DEFICIENCY: Vitamin D  deficiency means you have lower than normal levels of vitamin D . We will continue your vitamin D  supplementation and order a test to check your current levels.  -GENERAL HEALTH MAINTENANCE: We will continue your vitamin D  and B12 supplements. We also discussed that omega-3 alone is not sufficient for cholesterol management.  INSTRUCTIONS:  Please follow up with a urogynecologist for your urinary incontinence. Continue with your current therapies and medications as discussed.  We will order a vitamin D  level test to monitor your supplementation. Monitor your blood pressure regularly and consider starting Lescol for cholesterol management.

## 2024-04-20 ENCOUNTER — Ambulatory Visit: Admitting: Occupational Therapy

## 2024-04-20 ENCOUNTER — Ambulatory Visit: Payer: Self-pay | Admitting: Family Medicine

## 2024-04-20 ENCOUNTER — Encounter: Payer: Self-pay | Admitting: "Endocrinology

## 2024-04-20 DIAGNOSIS — E119 Type 2 diabetes mellitus without complications: Secondary | ICD-10-CM

## 2024-04-20 LAB — VITAMIN D 25 HYDROXY (VIT D DEFICIENCY, FRACTURES): Vit D, 25-Hydroxy: 33 ng/mL (ref 30.0–100.0)

## 2024-04-20 MED ORDER — METFORMIN HCL 500 MG PO TABS: 1000.0000 mg | ORAL_TABLET | Freq: Two times a day (BID) | ORAL | 4 refills | Status: DC

## 2024-04-21 ENCOUNTER — Ambulatory Visit: Admitting: Physician Assistant

## 2024-04-21 ENCOUNTER — Encounter: Payer: Self-pay | Admitting: Physician Assistant

## 2024-04-21 VITALS — BP 164/107 | HR 76 | Wt 157.9 lb

## 2024-04-21 DIAGNOSIS — N921 Excessive and frequent menstruation with irregular cycle: Secondary | ICD-10-CM | POA: Diagnosis not present

## 2024-04-21 DIAGNOSIS — Z8742 Personal history of other diseases of the female genital tract: Secondary | ICD-10-CM

## 2024-04-21 DIAGNOSIS — N889 Noninflammatory disorder of cervix uteri, unspecified: Secondary | ICD-10-CM

## 2024-04-21 NOTE — Progress Notes (Unsigned)
 GYN is in the office for abnormal bleeding. Pt was previously at [facility] location but wanted a 2nd opinion.  Reports heavy cycles that started over a year ago and is currently on Aygestin . Pt states that Aygestin  has stopped the heavy bleeding but caused her to spot daily. Pt states that she does not want to pursue surgical options but wants to discuss other alternatives.  Pt left before BP re-check could be performed

## 2024-04-21 NOTE — Progress Notes (Unsigned)
 GYNECOLOGY  VISIT   HPI: Amber Rose is a 48 y.o.   Single  {Race/ethnicity:17218}  female   [phone] here for ***    Progestin stopped heavy bleeding every day. Now bleeding every day. Last resort because doesn't heal well, body rejects foregin things, mesh. No longer statin due to side effects. Worried about BV with IUD   GYNECOLOGIC HISTORY: No LMP recorded. (Menstrual status: Other). Contraception: {contraception:315051}.   Menopausal hormone therapy:  *** Last mammogram:  *** Last pap smear:  Diagnosis  Date Value Ref Range Status  09/13/2020   Final   - Negative for intraepithelial lesion or malignancy (NILM)  09/13/2020 - Benign reactive/reparative changes  Final           OB History     Gravida  4   Para  3   Term  3   Preterm  0   AB  1   Living  3      SAB  0   IAB  1   Ectopic  0   Multiple  0   Live Births  3              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/2022   Steatorrhea 08/17/2022   Irregular menses 11/28/2021   Perimenopause 11/28/2021   Episode of recurrent major depressive disorder (HCC) 04/09/2021   BMI 33.0-33.9,adult 04/09/2021   Pure hypercholesterolemia 12/23/2020   Menorrhagia with regular cycle 09/13/2020   Well woman exam 09/13/2020   History of herpes zoster 01/17/2018   Overactive bladder 12/28/2017   Recurrent infections 11/19/2017   Cyst of nasal  cavity 11/19/2017   Angio-edema 11/19/2017   Abdominal pain, epigastric 11/04/2017   Dysphagia 11/04/2017   Gastroesophageal reflux disease 07/12/2017   Degenerative disc disease at L5-S1 level 05/14/2017   Fibromyalgia 05/14/2017   ADD (attention deficit disorder) 05/14/2017   Urinary incontinence 04/05/2017   Thigh pain 02/02/2017   Anxiety and depression 02/02/2017   Tobacco abuse 02/02/2017   Facial flushing 02/02/2017   Hypertension 10/23/2016   Hidradenitis 10/23/2016   Diabetes (HCC) 09/12/2014   Lupus anticoagulant positive 07/03/2014   Vitamin D  deficiency 06/06/2014    Past Medical History:  Diagnosis Date   Abnormal Pap smear of cervix    yrs ago   Adenomyosis    Allergy     Anemia    Anxiety    Aortic atherosclerosis (HCC)    Arthritis    Asthma    Chronic UTI    Clostridium difficile infection    Depression    Diabetes mellitus without complication (HCC) 10/22/2023   A1C 8.8   Diverticulosis    Endometriosis    Fibroid    Fibromyalgia    GERD (gastroesophageal reflux disease)    Hiatal hernia    Hyperlipidemia    Hypertension    Internal hemorrhoids    Meningitis, viral    Migraines    Pancreatitis    Pure hypercholesterolemia 12/23/2020   Tubular  adenoma of colon     Past Surgical History:  Procedure Laterality Date   APPENDECTOMY  1990   CERVICAL BIOPSY  W/ LOOP ELECTRODE EXCISION     LEEP   CESAREAN SECTION  2002   CHOLECYSTECTOMY  2011   COLONOSCOPY     UPPER GASTROINTESTINAL ENDOSCOPY     URINARY SURGERY     urethra sling, removal, and then revision 2016 (4 surgeries)   WISDOM TOOTH EXTRACTION      Current Outpatient Medications  Medication Sig Dispense Refill   albuterol  (PROAIR  HFA) 108 (90 Base) MCG/ACT inhaler Inhale 2 puffs into the lungs every 4 (four) hours as needed for wheezing or shortness of breath. 3 Inhaler 0   aspirin  EC 81 MG tablet Take 1 tablet (81 mg total) by mouth daily. Swallow whole. 30 tablet 12   baclofen   (LIORESAL ) 10 MG tablet Take 1 tablet (10 mg total) by mouth 3 (three) times daily as needed for muscle spasms. 90 each 11   Blood Glucose Monitoring Suppl (ACCU-CHEK AVIVA PLUS) w/Device KIT USE AS DIRECTED DAILY. E11.9 1 kit 0   buPROPion  (WELLBUTRIN  XL) 150 MG 24 hr tablet Take 1 tablet (150 mg total) by mouth daily. 90 tablet 1   Continuous Blood Gluc Receiver (FREESTYLE LIBRE 2 READER) DEVI Use to check blood sugar three times daily. E11.49 1 each 0   Continuous Glucose Sensor (FREESTYLE LIBRE 3 PLUS SENSOR) MISC Inject 1 Device into the skin continuous. Change every 15 days 6 each 3   Glucagon  (BAQSIMI  ONE PACK) 3 MG/DOSE POWD Place 1 Device into the nose as needed (Low blood sugar with impaired consciousness). 2 each 3   Glucagon  (GVOKE HYPOPEN  1-PACK) 1 MG/0.2ML SOAJ Inject 1 mg into the skin as needed (low blood sugar with impaired consciousness). 0.4 mL 2   glucose blood (FREESTYLE TEST STRIPS) test strip USE TO CHECK BLOOD SUGAR THREE TIMES DAILY. E11.49 100 strip 6   hydrOXYzine  (ATARAX ) 50 MG tablet Take 1 tablet (50 mg total) by mouth 3 (three) times daily as needed. 30 tablet 5   indapamide  (LOZOL ) 1.25 MG tablet Take 1 tablet (1.25 mg total) by mouth daily. 90 tablet 3   insulin  glargine (LANTUS  SOLOSTAR) 100 UNIT/ML Solostar Pen Inject 25 Units into the skin daily. Increase by 2 units to a maximum daily dose of 30 units every 4th day until blood sugars are at goal 15 mL 6   Insulin  Pen Needle 31G X 5 MM MISC 1 each by Does not apply route at bedtime. 30 each 5   Insulin  Pen Needle 32G X 4 MM MISC 1 Needle by Does not apply route 3 (three) times daily. 200 each 5   Lancet Devices (ACCU-CHEK SOFTCLIX) lancets Use as instructed daily. 1 each 5   Lancets (FREESTYLE) lancets Use to check blood sugar three times daily. E11.49 100 each 3   LUPRON DEPOT, 35-MONTH, 11.25 MG injection Inject 11.25 mg into the muscle every 3 (three) months.     metFORMIN  (GLUCOPHAGE ) 500 MG tablet Take 2 tablets  (1,000 mg total) by mouth 2 (two) times daily with a meal. 120 tablet 4   nicotine  (NICODERM CQ  - DOSED IN MG/24 HOURS) 21 mg/24hr patch Place 1 patch (21 mg total) onto the skin daily. 28 patch 0   norethindrone  (AYGESTIN ) 5 MG tablet TAKE 1 TABLET BY MOUTH THREE TIMES A DAY 252 tablet 2   omeprazole  (PRILOSEC) 40 MG capsule TAKE 1 CAPSULE BY MOUTH EVERY  MORNING  100 capsule 0   pregabalin  (LYRICA ) 100 MG capsule Take 1 capsule (100 mg total) by mouth 2 (two) times daily. 60 capsule 5   saccharomyces boulardii (FLORASTOR) 250 MG capsule Take 1 capsule (250 mg total) by mouth 2 (two) times daily. 60 capsule 0   Semaglutide , 2 MG/DOSE, 8 MG/3ML SOPN Inject 2 mg as directed once a week. 9 mL 1   spironolactone  (ALDACTONE ) 50 MG tablet Take 1 tablet (50 mg total) by mouth daily. 90 tablet 3   valACYclovir  (VALTREX ) 500 MG tablet TAKE 1 TABLET (500 MG TOTAL) BY MOUTH 2 (TWO) TIMES DAILY. FOR HERPES PROPHYLAXIS 90 tablet 1   insulin  lispro (HUMALOG  KWIKPEN) 100 UNIT/ML KwikPen Inject 1-15 Units into the skin 3 (three) times daily. 43 mL 1   No current facility-administered medications for this visit.     ALLERGIES: Amlodipine , Atorvastatin , Crestor  [rosuvastatin ], Irbesartan , Metronidazole , Penicillins, Xanax [alprazolam], Glyburide, Linagliptin, and Moxifloxacin  Family History  Problem Relation Age of Onset   Cancer Mother        cervical, breast, lung, skin, bladder-ex smoker   Hypertension Mother    Diabetes Father    Heart attack Maternal Grandfather    Multiple sclerosis Paternal Grandmother     Social History   Socioeconomic History   Marital status: Single    Spouse name: Not on file   Number of children: 3   Years of education: Not on file   Highest education level: Not on file  Occupational History   Not on file  Tobacco Use   Smoking status: Former    Current packs/day: 0.10    Average packs/day: 0.1 packs/day for 20.0 years (2.0 ttl pk-yrs)    Types: Cigarettes    Smokeless tobacco: Never   Tobacco comments:    Patient is engaged in health coaching for smoking cessation as of 12/26/20. Patient stated that she has stopped smoking, drinking and any form of drug usage since her stroke.  Vaping Use   Vaping status: Never Used  Substance and Sexual Activity   Alcohol use: Not Currently    Comment: occ   Drug use: Not Currently    Types: Marijuana    Comment: occ   Sexual activity: Yes    Partners: Male    Birth control/protection: Surgical    Comment: BTL  Other Topics Concern   Not on file  Social History Narrative   Lives home with adult niece.  Not working.  Disability pending.  Pt is single.  Education GED.  3 children.    Social Drivers of Corporate investment banker Strain: Low Risk  (01/25/2024)   Overall Financial Resource Strain (CARDIA)    Difficulty of Paying Living Expenses: Not very hard  Food Insecurity: Food Insecurity Present (01/25/2024)   Hunger Vital Sign    Worried About Running Out of Food in the Last Year: Sometimes true    Ran Out of Food in the Last Year: Sometimes true  Transportation Needs: No Transportation Needs (01/25/2024)   PRAPARE - Administrator, Civil Service (Medical): No    Lack of Transportation (Non-Medical): No  Physical Activity: Insufficiently Active (01/25/2024)   Exercise Vital Sign    Days of Exercise per Week: 3 days    Minutes of Exercise per Session: 30 min  Stress: Stress Concern Present (01/25/2024)   [employer] of Occupational Health - Occupational Stress Questionnaire    Feeling of Stress : Rather much  Social Connections: Socially Isolated (01/25/2024)  Social Connection and Isolation Panel    Frequency of Communication with Friends and Family: Three times a week    Frequency of Social Gatherings with Friends and Family: Once a week    Attends Religious Services: Never    Database administrator or Organizations: No    Attends Banker Meetings: Never    Marital  Status: Never married  Intimate Partner Violence: Not At Risk (01/25/2024)   Humiliation, Afraid, Rape, and Kick questionnaire    Fear of Current or Ex-Partner: No    Emotionally Abused: No    Physically Abused: No    Sexually Abused: No    Review of Systems  PHYSICAL EXAMINATION:    BP (!) 164/107   Pulse 76   Wt 157 lb 14.4 oz (71.6 kg)   BMI 27.97 kg/m     General appearance: alert, cooperative and appears stated age Head: Normocephalic, without obvious abnormality, atraumatic Neck: no adenopathy, supple, symmetrical, trachea midline and thyroid  normal to inspection and palpation Lungs: clear to auscultation bilaterally Breasts: normal appearance, no masses or tenderness, No nipple retraction or dimpling, No nipple discharge or bleeding, No axillary or supraclavicular adenopathy Heart: regular rate and rhythm Abdomen: soft, non-tender, no masses,  no organomegaly Extremities: extremities normal, atraumatic, no cyanosis or edema Skin: Skin color, texture, turgor normal. No rashes or lesions Lymph nodes: Cervical, supraclavicular, and axillary nodes normal. No abnormal inguinal nodes palpated Neurologic: Grossly normal  Pelvic: External genitalia:  no lesions              Urethra:  normal appearing urethra with no masses, tenderness or lesions              Bartholins and Skenes: normal                 Vagina: normal appearing vagina with normal color and discharge, no lesions              Cervix: no lesions                Bimanual Exam:  Uterus:  normal size, contour, position, consistency, mobility, non-tender              Adnexa: no mass, fullness, tenderness              Rectal exam: {yes no:314532}.  Confirms.              Anus:  normal sphincter tone, no lesions  Chaperone was present for exam  ASSESSMENT & PLAN  There are no diagnoses linked to this encounter.      An After Visit Summary was printed and given to the patient.   Deija Buhrman E Atwood Adcock,  PA-C 6/27/20259:57 AM

## 2024-04-22 ENCOUNTER — Encounter: Payer: Self-pay | Admitting: Physician Assistant

## 2024-04-24 ENCOUNTER — Ambulatory Visit: Admitting: Physical Therapy

## 2024-04-24 ENCOUNTER — Ambulatory Visit: Admitting: Occupational Therapy

## 2024-04-24 ENCOUNTER — Ambulatory Visit: Admitting: Speech Pathology

## 2024-04-24 ENCOUNTER — Ambulatory Visit: Admitting: Dietician

## 2024-04-26 ENCOUNTER — Encounter: Admitting: Occupational Therapy

## 2024-05-01 ENCOUNTER — Encounter

## 2024-05-01 ENCOUNTER — Ambulatory Visit: Attending: Physician Assistant | Admitting: Physical Therapy

## 2024-05-01 ENCOUNTER — Encounter: Admitting: Occupational Therapy

## 2024-05-01 ENCOUNTER — Encounter: Payer: Self-pay | Admitting: Physical Therapy

## 2024-05-01 DIAGNOSIS — M6281 Muscle weakness (generalized): Secondary | ICD-10-CM | POA: Diagnosis not present

## 2024-05-01 DIAGNOSIS — R278 Other lack of coordination: Secondary | ICD-10-CM | POA: Insufficient documentation

## 2024-05-01 DIAGNOSIS — R208 Other disturbances of skin sensation: Secondary | ICD-10-CM | POA: Insufficient documentation

## 2024-05-01 DIAGNOSIS — I69351 Hemiplegia and hemiparesis following cerebral infarction affecting right dominant side: Secondary | ICD-10-CM | POA: Insufficient documentation

## 2024-05-01 DIAGNOSIS — R2689 Other abnormalities of gait and mobility: Secondary | ICD-10-CM | POA: Insufficient documentation

## 2024-05-01 DIAGNOSIS — R29818 Other symptoms and signs involving the nervous system: Secondary | ICD-10-CM | POA: Diagnosis not present

## 2024-05-01 DIAGNOSIS — R2681 Unsteadiness on feet: Secondary | ICD-10-CM | POA: Diagnosis not present

## 2024-05-01 DIAGNOSIS — R29898 Other symptoms and signs involving the musculoskeletal system: Secondary | ICD-10-CM | POA: Insufficient documentation

## 2024-05-01 NOTE — Therapy (Signed)
 OUTPATIENT PHYSICAL THERAPY NEURO TREATMENT - RECERTIFICATION   Patient Name: Amber Rose MRN: [id number] DOB:03/29/1976, 48 y.o., female Today's Date: 05/01/2024   PCP: Delbert Clam, MD REFERRING PROVIDER: Danton Jon HERO, PA-C  END OF SESSION:  PT End of Session - 05/01/24 1153     Visit Number 11    Number of Visits 22   17+5   Date for PT Re-Evaluation 06/16/24   pushed out due to future multi-disciplinary and aquatic needs at 7/7 re-cert   Authorization Type [employer] dual complete    Progress Note Due on Visit 10    PT Start Time 1149    PT Stop Time 1230    PT Time Calculation (min) 41 min    Equipment Utilized During Treatment Gait belt    Activity Tolerance Patient tolerated treatment well    Behavior During Therapy WFL for tasks assessed/performed          Past Medical History:  Diagnosis Date   Abnormal Pap smear of cervix    yrs ago   Adenomyosis    Allergy     Anemia    Anxiety    Aortic atherosclerosis (HCC)    Arthritis    Asthma    Chronic UTI    Clostridium difficile infection    Depression    Diabetes mellitus without complication (HCC) 10/22/2023   A1C 8.8   Diverticulosis    Endometriosis    Fibroid    Fibromyalgia    GERD (gastroesophageal reflux disease)    Hiatal hernia    Hyperlipidemia    Hypertension    Internal hemorrhoids    Meningitis, viral    Migraines    Pancreatitis    Pure hypercholesterolemia 12/23/2020   Tubular adenoma of colon    Past Surgical History:  Procedure Laterality Date   APPENDECTOMY  1990   CERVICAL BIOPSY  W/ LOOP ELECTRODE EXCISION     LEEP   CESAREAN SECTION  2002   CHOLECYSTECTOMY  2011   COLONOSCOPY     UPPER GASTROINTESTINAL ENDOSCOPY     URINARY SURGERY     urethra sling, removal, and then revision 2016 (4 surgeries)   WISDOM TOOTH EXTRACTION     Patient Active Problem List   Diagnosis Date Noted   CVA (cerebral vascular accident) (HCC) 01/08/2024   Leukocytosis 01/08/2024   Type  2 diabetes mellitus with chronic kidney disease, with long-term current use of insulin  (HCC) 01/08/2024   Hyperlipidemia 01/08/2024   GERD (gastroesophageal reflux disease) 01/08/2024   Overweight (BMI 25.0-29.9) 01/08/2024   Moderately severe major depression (HCC) 01/06/2024   Yeast vaginitis 08/30/2023   Iron  deficiency anemia 03/11/2023   PMDD (premenstrual dysphoric disorder) 11/17/2022   Abnormal uterine bleeding (AUB) 11/17/2022   Statin myopathy 09/23/2022   Steatorrhea 08/17/2022   Irregular menses 11/28/2021   Perimenopause 11/28/2021   Episode of recurrent major depressive disorder (HCC) 04/09/2021   BMI 33.0-33.9,adult 04/09/2021   Pure hypercholesterolemia 12/23/2020   Menorrhagia with regular cycle 09/13/2020   Well woman exam 09/13/2020   History of herpes zoster 01/17/2018   Overactive bladder 12/28/2017   Recurrent infections 11/19/2017   Cyst of nasal cavity 11/19/2017   Angio-edema 11/19/2017   Abdominal pain, epigastric 11/04/2017   Dysphagia 11/04/2017   Gastroesophageal reflux disease 07/12/2017   Degenerative disc disease at L5-S1 level 05/14/2017   Fibromyalgia 05/14/2017   ADD (attention deficit disorder) 05/14/2017   Urinary incontinence 04/05/2017   Thigh pain 02/02/2017   Anxiety and depression 02/02/2017  Tobacco abuse 02/02/2017   Facial flushing 02/02/2017   Hypertension 10/23/2016   Hidradenitis 10/23/2016   Diabetes (HCC) 09/12/2014   Lupus anticoagulant positive 07/03/2014   Vitamin D  deficiency 06/06/2014    ONSET DATE: 01/07/2024 (Lacunar CVA)  REFERRING DIAG: G81.91 (ICD-10-CM) - Right hemiparesis (HCC) I63.9 (ICD-10-CM) - Cerebrovascular accident (CVA), unspecified mechanism (HCC)  THERAPY DIAG:  Muscle weakness (generalized)  Other lack of coordination  Other disturbances of skin sensation  Other symptoms and signs involving the nervous system  Other symptoms and signs involving the musculoskeletal system  Other  abnormalities of gait and mobility  Unsteadiness on feet  Rationale for Evaluation and Treatment: Rehabilitation  SUBJECTIVE:                                                                                                                                                                                             SUBJECTIVE STATEMENT: Pt reports she has been driving mostly during the day and is planning to see eye doctor about her night vision.  She is working a couple nights a week and has established her own routine for the daytime.  She has had no falls and her pain is some better today. Pt accompanied by: self (drove herself)  PERTINENT HISTORY: HTN, anxiety, depression, fibromyalgia, herpes zoster, DM2, statin intolerance, thalamic CVA, CKD  PAIN:  Are you having pain? Yes: NPRS scale: 6 Pain location: right arm Pain description: tingling/cramping Aggravating factors: using the arm, laying on the right Relieving factors: sitting still  PRECAUTIONS: Fall  RED FLAGS: Bowel or bladder incontinence: Yes: urinary incontinence - wearing depends today and MD aware per report   WEIGHT BEARING RESTRICTIONS: No  FALLS: Has patient fallen in last 6 months? No  LIVING ENVIRONMENT: Lives with: lives with an adult companion Lives in: House/apartment Stairs: Yes: External: 3 steps; none Has following equipment at home: Grab bars  PLOF: Independent with gait, Independent with transfers, Needs assistance with ADLs, and Needs assistance with homemaking  PATIENT GOALS: Everything is really up top so I don't really know about PT.  OBJECTIVE:  Note: Objective measures were completed at Evaluation unless otherwise noted.  DIAGNOSTIC FINDINGS:  MR Brain 01/08/2024: IMPRESSION: Acute lacunar infarct in the left thalamus.  COGNITION: Overall cognitive status: pt reports some difficulty with memory/losing her place with tasks due to focus   SENSATION: Light touch: WFL - pt reports  decreased R > L  COORDINATION: LE RAMS;  impaired Heel-to-shin:  WNL bilaterally  EDEMA:  WNL  MUSCLE TONE: Difficulty discerning flexion spasticity due to pt difficulty relaxing  POSTURE: rounded shoulders and forward head  LOWER EXTREMITY ROM:     Active  Right Eval Left Eval  Hip flexion WNL WNL  Hip extension    Hip abduction    Hip adduction    Hip internal rotation    Hip external rotation    Knee flexion    Knee extension    Ankle dorsiflexion    Ankle plantarflexion    Ankle inversion    Ankle eversion     (Blank rows = not tested)  LOWER EXTREMITY MMT:    MMT Right Eval Left Eval  Hip flexion 4-/5 4+/5  Hip extension    Hip abduction    Hip adduction    Hip internal rotation    Hip external rotation    Knee flexion 4-/5 4+/5  Knee extension 4/5 5/5  Ankle dorsiflexion 4+/5 5/5  Ankle plantarflexion    Ankle inversion    Ankle eversion    (Blank rows = not tested)  BED MOBILITY:  Pt sleeps in standard bed and requires increased time and momentum to get roll and has difficulty managing bedding  TRANSFERS: Assistive device utilized: None  Sit to stand: SBA Stand to sit: SBA Chair to chair: SBA  GAIT: Gait pattern: bilateral ER, step through pattern, decreased stride length, decreased hip/knee flexion- Right, decreased hip/knee flexion- Left, decreased ankle dorsiflexion- Right, decreased ankle dorsiflexion- Left, trendelenburg, and wide BOS Distance walked: various clinic distances Assistive device utilized: None Level of assistance: SBA Comments: Pt has severe drift from pathway to left then right when entering clinic, increased time to correct path with cuing.  Truncal ataxia?  FUNCTIONAL TESTS:  5 times sit to stand: 15.13 sec w/ light BUE support, severe hyperextension in standing, mild weight shift left during sitting Timed up and go (TUG): 11.28 sec SBA, no instability in turning with small radius 10 meter walk test: 8.50 sec =  1.18 m/sec OR 3.88 ft/sec Functional gait assessment: To be assessed.  PATIENT SURVEYS:  None completed due to time.                                                                                                                              TREATMENT DATE: 05/01/2024  -Verbally reviewed HEP - pt is compliant with most recent additions and has current copy - IND:  9.53 sec = 1.05 m/sec OR 3.46 ft/sec - :  989 ft SBA - pt has intermittent left stumble due to narrowed BOS w/ increased distance, reports R leg was burning during entirety of testing -R 10lb farmer's carry for 4-8 hurdles forward > laterally, cues to improve hip engagement, reports mild RLE burning with lateral stepping -Standing on airex progressing to unsupported for squatted ring toss to far target regressing to short target to increase burden of task relying on RUE  PATIENT EDUCATION: Education details:  BP monitoring at home/logging if able.  Continue HEP (additions made 6/23) if BP allows - reminder of modifying based on safe limits.  See above for further.  Discussed process for re-cert and ongoing POC. Person educated: Patient Education method: Explanation Education comprehension: verbalized understanding and needs further education  HOME EXERCISE PROGRAM: Access Code: [email or web address] URL: [email or web address] Date: 04/17/2024 Prepared by: Daved Bull  Exercises - Sit to Stand with Armchair  - 1 x daily - 7 x weekly - 3 sets - 10 reps - Corner Balance Feet Together With Eyes Open  - 1 x daily - 5 x weekly - 1 sets - 3-4 reps - 30 seconds hold - Corner Balance Feet Together With Eyes Closed  - 1 x daily - 5 x weekly - 1 sets - 3-4 reps - 30 seconds hold - Corner Balance Feet Together: Eyes Open With Head Turns  - 1 x daily - 5 x weekly - 1 sets - 3-4 reps - 30 seconds hold - Romberg Stance with Head Nods  - 1 x daily - 5 x weekly - 2-3 sets - 10 reps - Tandem Walking with Counter Support  - 1 x  daily - 5 x weekly - 3 sets - 10 reps - Side Stepping with Resistance at Thighs and Counter Support  - 1 x daily - 4 x weekly - 3 sets - 10 reps - Forward Backward Monster Walk with Band at Thighs and Counter Support  - 1 x daily - 4 x weekly - 3 sets - 10 reps - Squat with Counter Support  - 1 x daily - 4 x weekly - 2 sets - 10 reps  Chair yoga poses reviewed 5/7  GOALS: Goals reviewed with patient? Yes  SHORT TERM GOALS: Target date: 02/25/2024  Pt will be independent and compliant with initial strength and balance focused HEP in order to maintain functional progress and improve mobility. Baseline:  IND and compliant (5/7) Goal status: MET  2.  Pt will decrease 5xSTS to </=12 seconds w/ improved upright neutral in order to demonstrate decreased risk for falls and improved functional bilateral LE strength and power. Baseline: 15.13 sec w/ light BUE support and severe lumbar hyperextension on upright; 15.21 sec w/o UE support (5/7) Goal status: IN PROGRESS  LONG TERM GOALS: Target date: 03/24/2024  Pt will be independent and compliant with advanced and finalized strength and balance focused HEP in order to maintain functional progress and improve mobility. Baseline: Pt compliant and IND (5/28); updated (7/7) Goal status: MET  2.  Pt will demonstrate a gait speed of >/=4.08 feet/sec in order to decrease risk for falls. Baseline: 3.88 ft/sec; 3.37 ft/sec (5/28); 3.46 ft/sec (7/7) Goal status: PARTIALLY MET  3.  Pt will ambulate >/=500 feet with LRAD at no more than mod I level of assist over unlevel/compliant surfaces, curb step and ramp with improved trunk stability to promote household and community access. Baseline: instability over level ground/SBA; IND ramp/curb/500 ft over unlevel sidewalk and grass w/ only mild lateral wobble w/ transitions (5/28) Goal status: MET  4.  Pt will improve FGA score to >/=19/30 in order to demonstrate improved balance and decreased fall  risk. Baseline: 13/30 (4/11); 25/30 (5/28) Goal status: MET  GOALS:   Goals reviewed with patient? Yes SHORT TERM GOALS = LONG TERM GOALS: Target date: 06/16/2024  Pt will ambulate >/=1189 feet on IND to demonstrate improved functional endurance for home and community participation. Baseline: 989 ft SBA due to left stumble Goal status: INITIAL  2.  Pt will demonstrate a gait speed of >/=3.66 feet/sec in order to decrease risk for falls. Baseline:  3.88 ft/sec; 3.37 ft/sec (5/28); 3.46 ft/sec (7/7) Goal status: REVISED  ASSESSMENT:  CLINICAL IMPRESSION: Focus of skilled session on completing re-cert for focus on aquatics for activity tolerance and pain relief.  She will not have access to a pool after discharge so no goal for aquatic HEP and focusing on short POC to tune up her mobility.  She is limited by ongoing sensory changes in the right hemibody that are still prominent with increased activity level.  Will continue per POC to address deficits as able.  OBJECTIVE IMPAIRMENTS: Abnormal gait, decreased activity tolerance, decreased balance, decreased coordination, decreased knowledge of condition, decreased knowledge of use of DME, decreased strength, increased muscle spasms, impaired sensation, improper body mechanics, postural dysfunction, and pain.   ACTIVITY LIMITATIONS: carrying, lifting, bending, standing, squatting, stairs, transfers, bed mobility, continence, bathing, dressing, self feeding, reach over head, and locomotion level  PARTICIPATION LIMITATIONS: meal prep, cleaning, laundry, driving, community activity, and occupation  PERSONAL FACTORS: Fitness, Past/current experiences, Transportation, and 3+ comorbidities: HTN, fibromyalgia, DM2 are also affecting patient's functional outcome.   REHAB POTENTIAL: Good  CLINICAL DECISION MAKING: Evolving/moderate complexity  EVALUATION COMPLEXITY: Moderate  PLAN:  PT FREQUENCY: 1x/week + 2x/wk (1x in aquatics and 1x on  land)  PT DURATION: 8 weeks + 4 wks  PLANNED INTERVENTIONS: 02835- PT Re-evaluation, 97750- Physical Performance Testing, 97110-Therapeutic exercises, 97530- Therapeutic activity, [id number]- Neuromuscular re-education, 97535- Self Care, 02859- Manual therapy, [phone]- Gait training, [phone]- Orthotic Initial, [phone]- Aquatic Therapy, [phone]- Electrical stimulation (manual), Patient/Family education, Balance training, Stair training, Taping, Dry Needling, Joint mobilization, Spinal mobilization, Vestibular training, DME instructions, Cryotherapy, and Moist heat  PLAN FOR NEXT SESSION:    AQUATICS:  No longer planning for aquatic HEP per pt request 6/23, UE mobility/sensation and pain management, LE stretching and strengthening, general balance  Daved KATHEE Bull, PT, DPT 05/01/2024, 1:09 PM

## 2024-05-03 ENCOUNTER — Encounter: Admitting: Occupational Therapy

## 2024-05-04 ENCOUNTER — Ambulatory Visit: Admitting: Physical Therapy

## 2024-05-05 ENCOUNTER — Telehealth: Payer: Self-pay | Admitting: Family Medicine

## 2024-05-05 ENCOUNTER — Other Ambulatory Visit (HOSPITAL_BASED_OUTPATIENT_CLINIC_OR_DEPARTMENT_OTHER): Payer: Self-pay

## 2024-05-05 ENCOUNTER — Ambulatory Visit (INDEPENDENT_AMBULATORY_CARE_PROVIDER_SITE_OTHER): Admitting: Pharmacist Clinician (PhC)/ Clinical Pharmacy Specialist

## 2024-05-05 VITALS — BP 161/95 | Ht 63.0 in | Wt 159.0 lb

## 2024-05-05 DIAGNOSIS — E785 Hyperlipidemia, unspecified: Secondary | ICD-10-CM | POA: Diagnosis not present

## 2024-05-05 DIAGNOSIS — I10 Essential (primary) hypertension: Secondary | ICD-10-CM | POA: Diagnosis not present

## 2024-05-05 MED ORDER — NIFEDIPINE ER OSMOTIC RELEASE 30 MG PO TB24
30.0000 mg | ORAL_TABLET | Freq: Every day | ORAL | 6 refills | Status: DC
  Filled 2024-05-05 (×2): qty 30, 30d supply, fill #0

## 2024-05-05 NOTE — Telephone Encounter (Signed)
 Copied from CRM [id number]. Topic: Referral - Question >> May 05, 2024  4:10 PM Delon HERO wrote:  Reason for CRM: Clotilda is calling form [facility] calling to request a prescription and certificate of medical necessity for incontinence supplies. Please advise CB- [phone] -Phone [phone]

## 2024-05-05 NOTE — Patient Instructions (Signed)
 Follow up appointment: Tuesday August 26 at 10 am  Go to the lab TODAY TO CHECK KIDNEY FUNCTION  Take your BP meds as follows:  START NIFEDIPINE  XL 30 MG ONCE DAILY  CONTINUE WITH SPIRONOLACTONE  50 MG ONCE DAILY  Check your blood pressure at home daily (if able) and keep record of the readings.  Your blood pressure goal is < 130/80  To check your pressure at home you will need to:  1. Sit up in a chair, with feet flat on the floor and back supported. Do not cross your ankles or legs. 2. Rest your left arm so that the cuff is about heart level. If the cuff goes on your upper arm,  then just relax the arm on the table, arm of the chair or your lap. If you have a wrist cuff, we  suggest relaxing your wrist against your chest (think of it as Pledging the Flag with the  wrong arm).  3. Place the cuff snugly around your arm, about 1 inch above the crook of your elbow. The  cords should be inside the groove of your elbow.  4. Sit quietly, with the cuff in place, for about 5 minutes. After that 5 minutes press the power  button to start a reading. 5. Do not talk or move while the reading is taking place.  6. Record your readings on a sheet of paper. Although most cuffs have a memory, it is often  easier to see a pattern developing when the numbers are all in front of you.  7. You can repeat the reading after 1-3 minutes if it is recommended  Make sure your bladder is empty and you have not had caffeine or tobacco within the last 30 min  Always bring your blood pressure log with you to your appointments. If you have not brought your monitor in to be double checked for accuracy, please bring it to your next appointment.  You can find a list of quality blood pressure cuffs at [email or web address]  Important lifestyle changes to control high blood pressure  Intervention  Effect on the BP  Lose extra pounds and watch your waistline Weight loss is one of the most effective lifestyle changes for  controlling blood pressure. If you're overweight or obese, losing even a small amount of weight can help reduce blood pressure. Blood pressure might go down by about 1 millimeter of mercury (mm Hg) with each kilogram (about 2.2 pounds) of weight lost.  Exercise regularly As a general goal, aim for at least 30 minutes of moderate physical activity every day. Regular physical activity can lower high blood pressure by about 5 to 8 mm Hg.  Eat a healthy diet Eating a diet rich in whole grains, fruits, vegetables, and low-fat dairy products and low in saturated fat and cholesterol. A healthy diet can lower high blood pressure by up to 11 mm Hg.  Reduce salt (sodium) in your diet Even a small reduction of sodium in the diet can improve heart health and reduce high blood pressure by about 5 to 6 mm Hg.  Limit alcohol One drink equals 12 ounces of beer, 5 ounces of wine, or 1.5 ounces of 80-proof liquor.  Limiting alcohol to less than one drink a day for women or two drinks a day for men can help lower blood pressure by about 4 mm Hg.   If you have any questions or concerns please use My Chart to send questions or call the office at [phone]

## 2024-05-05 NOTE — Progress Notes (Unsigned)
 05/06/2024 Amber Rose 10/11/1976 [id number]   HPI:  Amber Rose is a 48 y.o. female patient of Dr Raford, with a PMH below who presents today for advanced hypertension clinic follow up.  Patient was referred to our clinic by Dr Delbert in early 2022 for hypertension associated with multiple medication intolerances.  She had moved to [place] from the [place] and states that all of her current problems began about 6 years ago (in [place] ) when she was put on a cholesterol medication and 2 blood pressure medications, all at the same time.  She developed severe leg pains that at times left her unable to walk.  She also developed facial swelling that comes and goes, she believes in relation to her BP readings.  She states that she continued on statin drugs for most of that time and nobody associated it with her leg pains, and once she finally stopped, the pains decreased by 60-70%.  She has been started on multiple medications in different drug classes, but continues to note side effects, mostly leg pain with and/or facial swelling.  She believed the leg pains were due to the medications tightening and relaxing the blood vessels and asked for a medication that would not do this.  She ultimately chose to work on lifestyle modifications, including transitioning to a plant based diet.    I have seen her several times over the last couple of years and she continued to have challenges controlling her pressure.  Much of it comes from intolerances to multiple medications.  Currently she is only tolerating spironolactone  50 mg daily.  She had a stroke earlier this year and currently she complains of menstrual cramping, for which she is taking ibuprofen  800 mg once or twice daily.  Notes that home BP continues to be elevated, running between 135-165 systolic.    Past Medical History: HLD 10/23  LDL 51 - on Repatha   DM2 8/22  A1c 7.3 - on Ozempic  1 mg, not significant weight loss ~6-8 pounds  CAD  Aortic atherosclerosis  fibromyalgia   Tobacco abuse States has tried to quit multiple times, down to 4 cigarettes/day     Blood Pressure Goal:  130/80  Current Medications: spironolactone  50 mg  Family Hx:   father has hypertension, no heart disease; mother has had multiple cancers; doesn't know much about siblings health, kids 23/28/21 - no health issues  Social Hx:    smokes 4 cigarettes/day- had one this morning; over the past month has cut back from 1 ppd, also using 21 mg nicotine  patches; only occasional alcohol; no coffee or tea regularly,   Diet:   has stopped eating out, variety of vegetables and proteins,  has cut back on carbs like pastas, rice, potatoes.  More fruits and vegetables on a daily basis.  Occasional beans or eggs  Exercise: stretches, yoga pilates You-Tube videos; exercises from therapy; walk, bouncing ball  Home BP readings:   machine many years old - arm cuff   AM 18 readings average 143/88 HR 74  (range 129-168/80-97)  PM 10 readings average 142/93 HR 75  (range 115-171/78-99)  Intolerances:  atorvastatin , rosuvastatin  - joint pains  Lisinopril   - cough  Hydralazine  - headache, hot flashes  Amlodipine  - leg cramps, facial swelling  Irbesartan  - leg cramps, facial swelling  hctz  Candesartan  -   Labs: 10/23:  Na 139, K 4.0, Glu 99, BUN 14, SCr 0.89, GFR 81   Wt Readings from Last 3 Encounters:  05/05/24 159 lb (72.1  kg)  04/21/24 157 lb 14.4 oz (71.6 kg)  04/19/24 158 lb 3.2 oz (71.8 kg)   BP Readings from Last 3 Encounters:  05/05/24 (!) 161/95  04/21/24 (!) 164/107  04/19/24 (!) 150/98   Pulse Readings from Last 3 Encounters:  04/21/24 76  04/19/24 77  04/13/24 84    Current Outpatient Medications  Medication Sig Dispense Refill   albuterol  (PROAIR  HFA) 108 (90 Base) MCG/ACT inhaler Inhale 2 puffs into the lungs every 4 (four) hours as needed for wheezing or shortness of breath. 3 Inhaler 0   aspirin  EC 81 MG tablet Take 1 tablet (81 mg  total) by mouth daily. Swallow whole. 30 tablet 12   baclofen  (LIORESAL ) 10 MG tablet Take 1 tablet (10 mg total) by mouth 3 (three) times daily as needed for muscle spasms. 90 each 11   Blood Glucose Monitoring Suppl (ACCU-CHEK AVIVA PLUS) w/Device KIT USE AS DIRECTED DAILY. E11.9 1 kit 0   buPROPion  (WELLBUTRIN  XL) 150 MG 24 hr tablet Take 1 tablet (150 mg total) by mouth daily. 90 tablet 1   Continuous Blood Gluc Receiver (FREESTYLE LIBRE 2 READER) DEVI Use to check blood sugar three times daily. E11.49 1 each 0   Continuous Glucose Sensor (FREESTYLE LIBRE 3 PLUS SENSOR) MISC Inject 1 Device into the skin continuous. Change every 15 days 6 each 3   Glucagon  (BAQSIMI  ONE PACK) 3 MG/DOSE POWD Place 1 Device into the nose as needed (Low blood sugar with impaired consciousness). 2 each 3   Glucagon  (GVOKE HYPOPEN  1-PACK) 1 MG/0.2ML SOAJ Inject 1 mg into the skin as needed (low blood sugar with impaired consciousness). 0.4 mL 2   glucose blood (FREESTYLE TEST STRIPS) test strip USE TO CHECK BLOOD SUGAR THREE TIMES DAILY. E11.49 100 strip 6   hydrOXYzine  (ATARAX ) 50 MG tablet Take 1 tablet (50 mg total) by mouth 3 (three) times daily as needed. 30 tablet 5   indapamide  (LOZOL ) 1.25 MG tablet Take 1 tablet (1.25 mg total) by mouth daily. 90 tablet 3   insulin  glargine (LANTUS  SOLOSTAR) 100 UNIT/ML Solostar Pen Inject 25 Units into the skin daily. Increase by 2 units to a maximum daily dose of 30 units every 4th day until blood sugars are at goal 15 mL 6   insulin  lispro (HUMALOG  KWIKPEN) 100 UNIT/ML KwikPen Inject 1-15 Units into the skin 3 (three) times daily. 43 mL 1   Insulin  Pen Needle 31G X 5 MM MISC 1 each by Does not apply route at bedtime. 30 each 5   Insulin  Pen Needle 32G X 4 MM MISC 1 Needle by Does not apply route 3 (three) times daily. 200 each 5   Lancet Devices (ACCU-CHEK SOFTCLIX) lancets Use as instructed daily. 1 each 5   Lancets (FREESTYLE) lancets Use to check blood sugar three times  daily. E11.49 100 each 3   LUPRON DEPOT, 5-MONTH, 11.25 MG injection Inject 11.25 mg into the muscle every 3 (three) months.     metFORMIN  (GLUCOPHAGE ) 500 MG tablet Take 2 tablets (1,000 mg total) by mouth 2 (two) times daily with a meal. 120 tablet 4   nicotine  (NICODERM CQ  - DOSED IN MG/24 HOURS) 21 mg/24hr patch Place 1 patch (21 mg total) onto the skin daily. 28 patch 0   NIFEdipine  (PROCARDIA -XL/NIFEDICAL-XL) 30 MG 24 hr tablet Take 1 tablet (30 mg total) by mouth daily. 30 tablet 6   norethindrone  (AYGESTIN ) 5 MG tablet TAKE 1 TABLET BY MOUTH THREE TIMES A DAY 252  tablet 2   omeprazole  (PRILOSEC) 40 MG capsule TAKE 1 CAPSULE BY MOUTH EVERY  MORNING 100 capsule 0   pregabalin  (LYRICA ) 100 MG capsule Take 1 capsule (100 mg total) by mouth 2 (two) times daily. 60 capsule 5   saccharomyces boulardii (FLORASTOR) 250 MG capsule Take 1 capsule (250 mg total) by mouth 2 (two) times daily. 60 capsule 0   Semaglutide , 2 MG/DOSE, 8 MG/3ML SOPN Inject 2 mg as directed once a week. 9 mL 1   spironolactone  (ALDACTONE ) 50 MG tablet Take 1 tablet (50 mg total) by mouth daily. 90 tablet 3   valACYclovir  (VALTREX ) 500 MG tablet TAKE 1 TABLET (500 MG TOTAL) BY MOUTH 2 (TWO) TIMES DAILY. FOR HERPES PROPHYLAXIS 90 tablet 1   No current facility-administered medications for this visit.    Allergies  Allergen Reactions   Amlodipine      Leg pain, swelling around eyes    Atorvastatin  Other (See Comments)     Joint pain   Crestor  [Rosuvastatin ]     Joint pain    Irbesartan      Leg pain, swelling around eyes   Metronidazole  Nausea And Vomiting   Penicillins     childhood   Xanax [Alprazolam]     Caused upper respiratory symptoms per pt   Glyburide Other (See Comments)    blood sugar dropped uncontrollably   Linagliptin Diarrhea and Other (See Comments)    Stomach pain, sinus infection   Moxifloxacin Diarrhea    Past Medical History:  Diagnosis Date   Abnormal Pap smear of cervix    yrs ago    Adenomyosis    Allergy     Anemia    Anxiety    Aortic atherosclerosis (HCC)    Arthritis    Asthma    Chronic UTI    Clostridium difficile infection    Depression    Diabetes mellitus without complication (HCC) 10/22/2023   A1C 8.8   Diverticulosis    Endometriosis    Fibroid    Fibromyalgia    GERD (gastroesophageal reflux disease)    Hiatal hernia    Hyperlipidemia    Hypertension    Internal hemorrhoids    Meningitis, viral    Migraines    Pancreatitis    Pure hypercholesterolemia 12/23/2020   Tubular adenoma of colon     Blood pressure (!) 161/95, height 5' 3 (1.6 m), weight 159 lb (72.1 kg).   Hypertension Assessment: BP is uncontrolled in office BP 161/95 mmHg;  above the goal (<130/80). Challenges with multiple medication intolerances Tolerates spironolactone  well, without any side effects Denies SOB, palpitation, chest pain or swelling Reiterated the importance of regular exercise and low salt diet   Plan:  Decrease nicotine  patch to 14 mg daily Start taking nifedipine  xl 30 mg once daily Continue taking spironolactone  50 mg daily Patient to keep record of BP readings with heart rate and report to us  at the next visit Patient to follow up with me in 3 weeks  Labs ordered today:  none  Allean Mink PharmD CPP [facility] [address]  [place], [place] [address] [phone]

## 2024-05-06 ENCOUNTER — Encounter (HOSPITAL_BASED_OUTPATIENT_CLINIC_OR_DEPARTMENT_OTHER): Payer: Self-pay | Admitting: Pharmacist Clinician (PhC)/ Clinical Pharmacy Specialist

## 2024-05-06 ENCOUNTER — Ambulatory Visit (HOSPITAL_BASED_OUTPATIENT_CLINIC_OR_DEPARTMENT_OTHER): Payer: Self-pay | Admitting: Pharmacist Clinician (PhC)/ Clinical Pharmacy Specialist

## 2024-05-06 LAB — BASIC METABOLIC PANEL WITH GFR
BUN/Creatinine Ratio: 15 (ref 9–23)
BUN: 17 mg/dL (ref 6–24)
CO2: 21 mmol/L (ref 20–29)
Calcium: 9.3 mg/dL (ref 8.7–10.2)
Chloride: 103 mmol/L (ref 96–106)
Creatinine, Ser: 1.17 mg/dL — ABNORMAL HIGH (ref 0.57–1.00)
Glucose: 140 mg/dL — ABNORMAL HIGH (ref 70–99)
Potassium: 5 mmol/L (ref 3.5–5.2)
Sodium: 140 mmol/L (ref 134–144)
eGFR: 58 mL/min/1.73 — ABNORMAL LOW (ref >59)

## 2024-05-06 NOTE — Assessment & Plan Note (Signed)
 Assessment: BP is uncontrolled in office BP 161/95 mmHg;  above the goal (<130/80). Challenges with multiple medication intolerances Tolerates spironolactone  well, without any side effects Denies SOB, palpitation, chest pain or swelling Reiterated the importance of regular exercise and low salt diet   Plan:  Decrease nicotine  patch to 14 mg daily Start taking nifedipine  xl 30 mg once daily Continue taking spironolactone  50 mg daily Patient to keep record of BP readings with heart rate and report to us  at the next visit Patient to follow up with me in 3 weeks  Labs ordered today:  none

## 2024-05-08 ENCOUNTER — Ambulatory Visit: Admitting: Physical Therapy

## 2024-05-08 NOTE — Telephone Encounter (Signed)
Orders has been faxed to [facility].

## 2024-05-09 ENCOUNTER — Encounter: Payer: Self-pay | Admitting: Adult Health

## 2024-05-10 NOTE — Telephone Encounter (Signed)
 Cld Pt in response to [facility] message. Pt stated she is having the same issues she experienced before she had her stroke. Stated the symptoms were happening a few weeks before her stroke but after her stroke the symptoms resolved. Pt reports now having headache at back and left side of neck, having a lot of fatigue and some nausea. Also stated having puffy eyes which she also experienced prior to stroke. Encouraged Pt to visit [facility] main ED for eval since she is now at greater risk of having another stroke. Pt voiced understanding and thanks for the call.

## 2024-05-11 ENCOUNTER — Ambulatory Visit: Admitting: Physical Therapy

## 2024-05-11 DIAGNOSIS — M6281 Muscle weakness (generalized): Secondary | ICD-10-CM

## 2024-05-11 DIAGNOSIS — R2689 Other abnormalities of gait and mobility: Secondary | ICD-10-CM

## 2024-05-11 DIAGNOSIS — R29818 Other symptoms and signs involving the nervous system: Secondary | ICD-10-CM

## 2024-05-11 DIAGNOSIS — I69351 Hemiplegia and hemiparesis following cerebral infarction affecting right dominant side: Secondary | ICD-10-CM | POA: Diagnosis not present

## 2024-05-11 DIAGNOSIS — R278 Other lack of coordination: Secondary | ICD-10-CM | POA: Diagnosis not present

## 2024-05-11 DIAGNOSIS — R29898 Other symptoms and signs involving the musculoskeletal system: Secondary | ICD-10-CM

## 2024-05-11 DIAGNOSIS — R2681 Unsteadiness on feet: Secondary | ICD-10-CM | POA: Diagnosis not present

## 2024-05-11 DIAGNOSIS — R208 Other disturbances of skin sensation: Secondary | ICD-10-CM | POA: Diagnosis not present

## 2024-05-12 ENCOUNTER — Encounter: Payer: Self-pay | Admitting: "Endocrinology

## 2024-05-12 ENCOUNTER — Other Ambulatory Visit: Payer: Self-pay | Admitting: "Endocrinology

## 2024-05-12 DIAGNOSIS — E119 Type 2 diabetes mellitus without complications: Secondary | ICD-10-CM

## 2024-05-12 MED ORDER — SEMAGLUTIDE (1 MG/DOSE) 4 MG/3ML ~~LOC~~ SOPN
1.0000 mg | PEN_INJECTOR | SUBCUTANEOUS | 1 refills | Status: DC
Start: 2024-05-12 — End: 2024-06-29

## 2024-05-15 ENCOUNTER — Encounter: Payer: Self-pay | Admitting: Physical Therapy

## 2024-05-15 NOTE — Therapy (Signed)
 OUTPATIENT PHYSICAL THERAPY NEURO TREATMENT   Patient Name: Amber Rose MRN: [id number] DOB:[birth year], 48 y.o., female Today's Date: 05/15/2024   PCP: Delbert Clam, MD REFERRING PROVIDER: Danton Jon HERO, PA-C  END OF SESSION:   05/11/24 1015  PT Visits / Re-Eval  Visit Number 12  Number of Visits 22 (17+5)  Date for PT Re-Evaluation 06/16/24 (pushed out due to future multi-disciplinary and aquatic needs at 7/7 re-cert)  Authorization  Authorization Type [employer] dual complete  Progress Note Due on Visit 10  PT Time Calculation  PT Start Time 1015  PT Stop Time 1109  PT Time Calculation (min) 54 min  PT - End of Session  Equipment Utilized During Treatment Floatation devices as needed for safety  Activity Tolerance Patient tolerated treatment well  Behavior During Therapy WFL for tasks assessed/performed    Past Medical History:  Diagnosis Date   Abnormal Pap smear of cervix    yrs ago   Adenomyosis    Allergy     Anemia    Anxiety    Aortic atherosclerosis (HCC)    Arthritis    Asthma    Chronic UTI    Clostridium difficile infection    Depression    Diabetes mellitus without complication (HCC) 10/22/2023   A1C 8.8   Diverticulosis    Endometriosis    Fibroid    Fibromyalgia    GERD (gastroesophageal reflux disease)    Hiatal hernia    Hyperlipidemia    Hypertension    Internal hemorrhoids    Meningitis, viral    Migraines    Pancreatitis    Pure hypercholesterolemia 12/23/2020   Tubular adenoma of colon    Past Surgical History:  Procedure Laterality Date   APPENDECTOMY  1990   CERVICAL BIOPSY  W/ LOOP ELECTRODE EXCISION     LEEP   CESAREAN SECTION  2002   CHOLECYSTECTOMY  2011   COLONOSCOPY     UPPER GASTROINTESTINAL ENDOSCOPY     URINARY SURGERY     urethra sling, removal, and then revision 2016 (4 surgeries)   WISDOM TOOTH EXTRACTION     Patient Active Problem List   Diagnosis Date Noted   CVA (cerebral vascular accident)  (HCC) 01/08/2024   Leukocytosis 01/08/2024   Type 2 diabetes mellitus with chronic kidney disease, with long-term current use of insulin  (HCC) 01/08/2024   Hyperlipidemia 01/08/2024   GERD (gastroesophageal reflux disease) 01/08/2024   Overweight (BMI 25.0-29.9) 01/08/2024   Moderately severe major depression (HCC) 01/06/2024   Yeast vaginitis 08/30/2023   Iron  deficiency anemia 03/11/2023   PMDD (premenstrual dysphoric disorder) 11/17/2022   Abnormal uterine bleeding (AUB) 11/17/2022   Statin myopathy 09/23/2022   Steatorrhea 08/17/2022   Irregular menses 11/28/2021   Perimenopause 11/28/2021   Episode of recurrent major depressive disorder (HCC) 04/09/2021   BMI 33.0-33.9,adult 04/09/2021   Pure hypercholesterolemia 12/23/2020   Menorrhagia with regular cycle 09/13/2020   Well woman exam 09/13/2020   History of herpes zoster 01/17/2018   Overactive bladder 12/28/2017   Recurrent infections 11/19/2017   Cyst of nasal cavity 11/19/2017   Angio-edema 11/19/2017   Abdominal pain, epigastric 11/04/2017   Dysphagia 11/04/2017   Gastroesophageal reflux disease 07/12/2017   Degenerative disc disease at L5-S1 level 05/14/2017   Fibromyalgia 05/14/2017   ADD (attention deficit disorder) 05/14/2017   Urinary incontinence 04/05/2017   Thigh pain 02/02/2017   Anxiety and depression 02/02/2017   Tobacco abuse 02/02/2017   Facial flushing 02/02/2017   Hypertension 10/23/2016   Hidradenitis  10/23/2016   Diabetes (HCC) 09/12/2014   Lupus anticoagulant positive 07/03/2014   Vitamin D  deficiency 06/06/2014    ONSET DATE: 01/07/2024 (Lacunar CVA)  REFERRING DIAG: G81.91 (ICD-10-CM) - Right hemiparesis (HCC) I63.9 (ICD-10-CM) - Cerebrovascular accident (CVA), unspecified mechanism (HCC)  THERAPY DIAG:  Muscle weakness (generalized)  Other lack of coordination  Other disturbances of skin sensation  Other symptoms and signs involving the nervous system  Other symptoms and signs  involving the musculoskeletal system  Other abnormalities of gait and mobility  Unsteadiness on feet  Rationale for Evaluation and Treatment: Rehabilitation  SUBJECTIVE:                                                                                                                                                                                             SUBJECTIVE STATEMENT: Patient reports she has been following up with her doctor in regards to stress incontinence when she coughs and sneezes, etc.  She states she is being medically managed for this.  She reports she is feeling better today and her BP meds have been changed and she feels this has helped her prior symptoms as her eye puffiness has improved.  She has had no falls and her pain is some better today. Pt accompanied by: self (drove herself)  PERTINENT HISTORY: HTN, anxiety, depression, fibromyalgia, herpes zoster, DM2, statin intolerance, thalamic CVA, CKD  PAIN:  Are you having pain? Yes: NPRS scale: 7 Pain location: right hemibody Pain description: tingling/cramping Aggravating factors: using the arm, laying on the right Relieving factors: sitting still  PRECAUTIONS: Fall  RED FLAGS: Bowel or bladder incontinence: Yes: urinary incontinence - wearing depends today and MD aware per report   WEIGHT BEARING RESTRICTIONS: No  FALLS: Has patient fallen in last 6 months? No  LIVING ENVIRONMENT: Lives with: lives with an adult companion Lives in: House/apartment Stairs: Yes: External: 3 steps; none Has following equipment at home: Grab bars  PLOF: Independent with gait, Independent with transfers, Needs assistance with ADLs, and Needs assistance with homemaking  PATIENT GOALS: Everything is really up top so I don't really know about PT.  OBJECTIVE:  Note: Objective measures were completed at Evaluation unless otherwise noted.  DIAGNOSTIC FINDINGS:  MR Brain 01/08/2024: IMPRESSION: Acute lacunar infarct in the  left thalamus.  COGNITION: Overall cognitive status: pt reports some difficulty with memory/losing her place with tasks due to focus   SENSATION: Light touch: WFL - pt reports decreased R > L  COORDINATION: LE RAMS;  impaired Heel-to-shin:  WNL bilaterally  EDEMA:  WNL  MUSCLE TONE: Difficulty discerning flexion spasticity due to pt difficulty relaxing  POSTURE: rounded shoulders and forward head  LOWER EXTREMITY ROM:     Active  Right Eval Left Eval  Hip flexion WNL WNL  Hip extension    Hip abduction    Hip adduction    Hip internal rotation    Hip external rotation    Knee flexion    Knee extension    Ankle dorsiflexion    Ankle plantarflexion    Ankle inversion    Ankle eversion     (Blank rows = not tested)  LOWER EXTREMITY MMT:    MMT Right Eval Left Eval  Hip flexion 4-/5 4+/5  Hip extension    Hip abduction    Hip adduction    Hip internal rotation    Hip external rotation    Knee flexion 4-/5 4+/5  Knee extension 4/5 5/5  Ankle dorsiflexion 4+/5 5/5  Ankle plantarflexion    Ankle inversion    Ankle eversion    (Blank rows = not tested)  BED MOBILITY:  Pt sleeps in standard bed and requires increased time and momentum to get roll and has difficulty managing bedding  TRANSFERS: Assistive device utilized: None  Sit to stand: SBA Stand to sit: SBA Chair to chair: SBA  GAIT: Gait pattern: bilateral ER, step through pattern, decreased stride length, decreased hip/knee flexion- Right, decreased hip/knee flexion- Left, decreased ankle dorsiflexion- Right, decreased ankle dorsiflexion- Left, trendelenburg, and wide BOS Distance walked: various clinic distances Assistive device utilized: None Level of assistance: SBA Comments: Pt has severe drift from pathway to left then right when entering clinic, increased time to correct path with cuing.  Truncal ataxia?  FUNCTIONAL TESTS:  5 times sit to stand: 15.13 sec w/ light BUE support, severe  hyperextension in standing, mild weight shift left during sitting Timed up and go (TUG): 11.28 sec SBA, no instability in turning with small radius 10 meter walk test: 8.50 sec = 1.18 m/sec OR 3.88 ft/sec Functional gait assessment: To be assessed.  PATIENT SURVEYS:  None completed due to time.                                                                                                                              TREATMENT DATE: 05/11/2024  Aquatic therapy at [facility] - pool temperature 92 degrees   Patient seen for aquatic therapy today.  Treatment took place in water 3.6-4.8 feet deep depending upon activity.  Patient entered and exited the pool via stairs using reciprocal pattern and left rail at mod I level.   Exercises: Water walking warmup unsupported 4x18 ft forward > backward > laterally Using yellow bilateral DB:  shoulder flexion > abduction > chest press > push downs > horizontal abduction 2x20 performed circuit style Quad stretch at pool wall with noodles 2x1 minute each side, pt prefers slow pulse to static stretch Side-to-side hamstring stretch w/ pool noodle 2x2 minutes each LE Walking lunges forward and backward 4x18 ft each Squat walk laterally  4x18 ft each direction STS x10 Seated fwd > reverse bicycle x1 minute each Unsupported sitting flutter kicks using large amplitude and contralateral reach for increased core engagement x2 minutes  Patient requires buoyancy of the water for support for reduced fall risk with gait training and balance exercises with SBA support. Exercises able to be performed safely in water without the risk of fall compared to those same exercises performed on land; viscosity of water needed for resistance for strengthening. Current of water provides perturbations for challenging static and dynamic balance.   PATIENT EDUCATION: Education details:  Aquatic rationale. Person educated: Patient Education method: Explanation Education  comprehension: verbalized understanding and needs further education  HOME EXERCISE PROGRAM: Access Code: [email or web address] URL: [email or web address] Date: 04/17/2024 Prepared by: Daved Bull  Exercises - Sit to Stand with Armchair  - 1 x daily - 7 x weekly - 3 sets - 10 reps - Corner Balance Feet Together With Eyes Open  - 1 x daily - 5 x weekly - 1 sets - 3-4 reps - 30 seconds hold - Corner Balance Feet Together With Eyes Closed  - 1 x daily - 5 x weekly - 1 sets - 3-4 reps - 30 seconds hold - Corner Balance Feet Together: Eyes Open With Head Turns  - 1 x daily - 5 x weekly - 1 sets - 3-4 reps - 30 seconds hold - Romberg Stance with Head Nods  - 1 x daily - 5 x weekly - 2-3 sets - 10 reps - Tandem Walking with Counter Support  - 1 x daily - 5 x weekly - 3 sets - 10 reps - Side Stepping with Resistance at Thighs and Counter Support  - 1 x daily - 4 x weekly - 3 sets - 10 reps - Forward Backward Monster Walk with Band at Thighs and Counter Support  - 1 x daily - 4 x weekly - 3 sets - 10 reps - Squat with Counter Support  - 1 x daily - 4 x weekly - 2 sets - 10 reps  Chair yoga poses reviewed 5/7  GOALS: Goals reviewed with patient? Yes  SHORT TERM GOALS: Target date: 02/25/2024  Pt will be independent and compliant with initial strength and balance focused HEP in order to maintain functional progress and improve mobility. Baseline:  IND and compliant (5/7) Goal status: MET  2.  Pt will decrease 5xSTS to </=12 seconds w/ improved upright neutral in order to demonstrate decreased risk for falls and improved functional bilateral LE strength and power. Baseline: 15.13 sec w/ light BUE support and severe lumbar hyperextension on upright; 15.21 sec w/o UE support (5/7) Goal status: IN PROGRESS  LONG TERM GOALS: Target date: 03/24/2024  Pt will be independent and compliant with advanced and finalized strength and balance focused HEP in order to maintain functional progress and  improve mobility. Baseline: Pt compliant and IND (5/28); updated (7/7) Goal status: MET  2.  Pt will demonstrate a gait speed of >/=4.08 feet/sec in order to decrease risk for falls. Baseline: 3.88 ft/sec; 3.37 ft/sec (5/28); 3.46 ft/sec (7/7) Goal status: PARTIALLY MET  3.  Pt will ambulate >/=500 feet with LRAD at no more than mod I level of assist over unlevel/compliant surfaces, curb step and ramp with improved trunk stability to promote household and community access. Baseline: instability over level ground/SBA; IND ramp/curb/500 ft over unlevel sidewalk and grass w/ only mild lateral wobble w/ transitions (5/28) Goal status: MET  4.  Pt will improve FGA score  to >/=19/30 in order to demonstrate improved balance and decreased fall risk. Baseline: 13/30 (4/11); 25/30 (5/28) Goal status: MET  GOALS:   Goals reviewed with patient? Yes SHORT TERM GOALS = LONG TERM GOALS: Target date: 06/16/2024  Pt will ambulate >/=1189 feet on IND to demonstrate improved functional endurance for home and community participation. Baseline: 989 ft SBA due to left stumble Goal status: INITIAL  2.  Pt will demonstrate a gait speed of >/=3.66 feet/sec in order to decrease risk for falls. Baseline: 3.88 ft/sec; 3.37 ft/sec (5/28); 3.46 ft/sec (7/7) Goal status: REVISED  ASSESSMENT:  CLINICAL IMPRESSION: Patient seen for aquatic session at [facility] facility today.  She is moving very well and remains limited by sensory deficits.  She tolerates all interventions well in pool demonstrating improved pain tolerance throughout.  She continues to benefit from skilled PT to further improve mobility tolerance and build upon land based strengthening and balance strategies practiced previously.  Continue per POC.  OBJECTIVE IMPAIRMENTS: Abnormal gait, decreased activity tolerance, decreased balance, decreased coordination, decreased knowledge of condition, decreased knowledge of use of DME, decreased strength,  increased muscle spasms, impaired sensation, improper body mechanics, postural dysfunction, and pain.   ACTIVITY LIMITATIONS: carrying, lifting, bending, standing, squatting, stairs, transfers, bed mobility, continence, bathing, dressing, self feeding, reach over head, and locomotion level  PARTICIPATION LIMITATIONS: meal prep, cleaning, laundry, driving, community activity, and occupation  PERSONAL FACTORS: Fitness, Past/current experiences, Transportation, and 3+ comorbidities: HTN, fibromyalgia, DM2 are also affecting patient's functional outcome.   REHAB POTENTIAL: Good  CLINICAL DECISION MAKING: Evolving/moderate complexity  EVALUATION COMPLEXITY: Moderate  PLAN:  PT FREQUENCY: 1x/week + 2x/wk (1x in aquatics and 1x on land)  PT DURATION: 8 weeks + 4 wks  PLANNED INTERVENTIONS: 02835- PT Re-evaluation, 97750- Physical Performance Testing, 97110-Therapeutic exercises, 97530- Therapeutic activity, [id number]- Neuromuscular re-education, 97535- Self Care, 02859- Manual therapy, [phone]- Gait training, [phone]- Orthotic Initial, [phone]- Aquatic Therapy, [phone]- Electrical stimulation (manual), Patient/Family education, Balance training, Stair training, Taping, Dry Needling, Joint mobilization, Spinal mobilization, Vestibular training, DME instructions, Cryotherapy, and Moist heat  PLAN FOR NEXT SESSION:    AQUATICS:  No longer planning for aquatic HEP per pt request 6/23, UE mobility/sensation and pain management, LE stretching and strengthening, general balance  Daved KATHEE Bull, PT, DPT 05/15/2024, 8:17 AM

## 2024-05-17 ENCOUNTER — Encounter: Payer: Self-pay | Admitting: Pharmacist Clinician (PhC)/ Clinical Pharmacy Specialist

## 2024-05-18 ENCOUNTER — Ambulatory Visit: Admitting: Physical Therapy

## 2024-05-19 ENCOUNTER — Ambulatory Visit
Attending: Pharmacist Clinician (PhC)/ Clinical Pharmacy Specialist | Admitting: Pharmacist Clinician (PhC)/ Clinical Pharmacy Specialist

## 2024-05-25 ENCOUNTER — Ambulatory Visit: Payer: Self-pay | Admitting: Physical Therapy

## 2024-05-25 ENCOUNTER — Encounter: Payer: Self-pay | Admitting: Physical Therapy

## 2024-05-25 DIAGNOSIS — R208 Other disturbances of skin sensation: Secondary | ICD-10-CM

## 2024-05-25 DIAGNOSIS — M6281 Muscle weakness (generalized): Secondary | ICD-10-CM

## 2024-05-25 DIAGNOSIS — R2689 Other abnormalities of gait and mobility: Secondary | ICD-10-CM

## 2024-05-25 DIAGNOSIS — R2681 Unsteadiness on feet: Secondary | ICD-10-CM

## 2024-05-25 DIAGNOSIS — R278 Other lack of coordination: Secondary | ICD-10-CM

## 2024-05-25 DIAGNOSIS — R29898 Other symptoms and signs involving the musculoskeletal system: Secondary | ICD-10-CM

## 2024-05-25 DIAGNOSIS — I69351 Hemiplegia and hemiparesis following cerebral infarction affecting right dominant side: Secondary | ICD-10-CM

## 2024-05-25 DIAGNOSIS — E785 Hyperlipidemia, unspecified: Secondary | ICD-10-CM | POA: Diagnosis not present

## 2024-05-25 DIAGNOSIS — R29818 Other symptoms and signs involving the nervous system: Secondary | ICD-10-CM | POA: Diagnosis not present

## 2024-05-25 NOTE — Therapy (Signed)
 OUTPATIENT PHYSICAL THERAPY NEURO TREATMENT   Patient Name: Amber Rose MRN: [id number] DOB:01-06-1976, 48 y.o., female Today's Date: 05/25/2024   PCP: Delbert Clam, MD REFERRING PROVIDER: Danton Jon HERO, PA-C  END OF SESSION:  PT End of Session - 05/25/24 0949     Visit Number 13    Number of Visits 22   17+5   Date for PT Re-Evaluation 06/16/24   pushed out due to future multi-disciplinary and aquatic needs at 7/7 re-cert   Authorization Type [employer] dual complete    Progress Note Due on Visit 10    PT Start Time 0937    PT Stop Time 1015    PT Time Calculation (min) 38 min    Equipment Utilized During Treatment Other (comment)   Floatation devices as needed for safety   Activity Tolerance Patient tolerated treatment well    Behavior During Therapy [facility] for tasks assessed/performed           Past Medical History:  Diagnosis Date   Abnormal Pap smear of cervix    yrs ago   Adenomyosis    Allergy     Anemia    Anxiety    Aortic atherosclerosis (HCC)    Arthritis    Asthma    Chronic UTI    Clostridium difficile infection    Depression    Diabetes mellitus without complication (HCC) 10/22/2023   A1C 8.8   Diverticulosis    Endometriosis    Fibroid    Fibromyalgia    GERD (gastroesophageal reflux disease)    Hiatal hernia    Hyperlipidemia    Hypertension    Internal hemorrhoids    Meningitis, viral    Migraines    Pancreatitis    Pure hypercholesterolemia 12/23/2020   Tubular adenoma of colon    Past Surgical History:  Procedure Laterality Date   APPENDECTOMY  1990   CERVICAL BIOPSY  W/ LOOP ELECTRODE EXCISION     LEEP   CESAREAN SECTION  2002   CHOLECYSTECTOMY  2011   COLONOSCOPY     UPPER GASTROINTESTINAL ENDOSCOPY     URINARY SURGERY     urethra sling, removal, and then revision 2016 (4 surgeries)   WISDOM TOOTH EXTRACTION     Patient Active Problem List   Diagnosis Date Noted   CVA (cerebral vascular accident) (HCC) 01/08/2024    Leukocytosis 01/08/2024   Type 2 diabetes mellitus with chronic kidney disease, with long-term current use of insulin  (HCC) 01/08/2024   Hyperlipidemia 01/08/2024   GERD (gastroesophageal reflux disease) 01/08/2024   Overweight (BMI 25.0-29.9) 01/08/2024   Moderately severe major depression (HCC) 01/06/2024   Yeast vaginitis 08/30/2023   Iron  deficiency anemia 03/11/2023   PMDD (premenstrual dysphoric disorder) 11/17/2022   Abnormal uterine bleeding (AUB) 11/17/2022   Statin myopathy 09/23/2022   Steatorrhea 08/17/2022   Irregular menses 11/28/2021   Perimenopause 11/28/2021   Episode of recurrent major depressive disorder (HCC) 04/09/2021   BMI 33.0-33.9,adult 04/09/2021   Pure hypercholesterolemia 12/23/2020   Menorrhagia with regular cycle 09/13/2020   Well woman exam 09/13/2020   History of herpes zoster 01/17/2018   Overactive bladder 12/28/2017   Recurrent infections 11/19/2017   Cyst of nasal cavity 11/19/2017   Angio-edema 11/19/2017   Abdominal pain, epigastric 11/04/2017   Dysphagia 11/04/2017   Gastroesophageal reflux disease 07/12/2017   Degenerative disc disease at L5-S1 level 05/14/2017   Fibromyalgia 05/14/2017   ADD (attention deficit disorder) 05/14/2017   Urinary incontinence 04/05/2017   Thigh pain 02/02/2017  Anxiety and depression 02/02/2017   Tobacco abuse 02/02/2017   Facial flushing 02/02/2017   Hypertension 10/23/2016   Hidradenitis 10/23/2016   Diabetes (HCC) 09/12/2014   Lupus anticoagulant positive 07/03/2014   Vitamin D  deficiency 06/06/2014    ONSET DATE: 01/07/2024 (Lacunar CVA)  REFERRING DIAG: G81.91 (ICD-10-CM) - Right hemiparesis (HCC) I63.9 (ICD-10-CM) - Cerebrovascular accident (CVA), unspecified mechanism (HCC)  THERAPY DIAG:  Muscle weakness (generalized)  Other lack of coordination  Other disturbances of skin sensation  Other symptoms and signs involving the nervous system  Other symptoms and signs involving the  musculoskeletal system  Other abnormalities of gait and mobility  Unsteadiness on feet  Hemiplegia and hemiparesis following cerebral infarction affecting right dominant side (HCC)  Rationale for Evaluation and Treatment: Rehabilitation  SUBJECTIVE:                                                                                                                                                                                             SUBJECTIVE STATEMENT: Patient reports recent BP med made her sick.  She is no longer taking this or birth control.  She presents to [facility] alone without AD. Pt accompanied by: self (drove herself)  PERTINENT HISTORY: HTN, anxiety, depression, fibromyalgia, herpes zoster, DM2, statin intolerance, thalamic CVA, CKD  PAIN:  Are you having pain? Yes: NPRS scale: 7 Pain location: right hemibody Pain description: tingling/cramping Aggravating factors: using the arm, laying on the right Relieving factors: sitting still  PRECAUTIONS: Fall  RED FLAGS: Bowel or bladder incontinence: Yes: urinary incontinence - wearing depends today and MD aware per report   WEIGHT BEARING RESTRICTIONS: No  FALLS: Has patient fallen in last 6 months? No  LIVING ENVIRONMENT: Lives with: lives with an adult companion Lives in: House/apartment Stairs: Yes: External: 3 steps; none Has following equipment at home: Grab bars  PLOF: Independent with gait, Independent with transfers, Needs assistance with ADLs, and Needs assistance with homemaking  PATIENT GOALS: Everything is really up top so I don't really know about PT.  OBJECTIVE:  Note: Objective measures were completed at Evaluation unless otherwise noted.  DIAGNOSTIC FINDINGS:  MR Brain 01/08/2024: IMPRESSION: Acute lacunar infarct in the left thalamus.  COGNITION: Overall cognitive status: pt reports some difficulty with memory/losing her place with tasks due to focus   SENSATION: Light touch: WFL - pt reports  decreased R > L  COORDINATION: LE RAMS;  impaired Heel-to-shin:  WNL bilaterally  EDEMA:  WNL  MUSCLE TONE: Difficulty discerning flexion spasticity due to pt difficulty relaxing  POSTURE: rounded shoulders and forward head  LOWER EXTREMITY ROM:     Active  Right Eval Left Eval  Hip flexion WNL WNL  Hip extension    Hip abduction    Hip adduction    Hip internal rotation    Hip external rotation    Knee flexion    Knee extension    Ankle dorsiflexion    Ankle plantarflexion    Ankle inversion    Ankle eversion     (Blank rows = not tested)  LOWER EXTREMITY MMT:    MMT Right Eval Left Eval  Hip flexion 4-/5 4+/5  Hip extension    Hip abduction    Hip adduction    Hip internal rotation    Hip external rotation    Knee flexion 4-/5 4+/5  Knee extension 4/5 5/5  Ankle dorsiflexion 4+/5 5/5  Ankle plantarflexion    Ankle inversion    Ankle eversion    (Blank rows = not tested)  BED MOBILITY:  Pt sleeps in standard bed and requires increased time and momentum to get roll and has difficulty managing bedding  TRANSFERS: Assistive device utilized: None  Sit to stand: SBA Stand to sit: SBA Chair to chair: SBA  GAIT: Gait pattern: bilateral ER, step through pattern, decreased stride length, decreased hip/knee flexion- Right, decreased hip/knee flexion- Left, decreased ankle dorsiflexion- Right, decreased ankle dorsiflexion- Left, trendelenburg, and wide BOS Distance walked: various clinic distances Assistive device utilized: None Level of assistance: SBA Comments: Pt has severe drift from pathway to left then right when entering clinic, increased time to correct path with cuing.  Truncal ataxia?  FUNCTIONAL TESTS:  5 times sit to stand: 15.13 sec w/ light BUE support, severe hyperextension in standing, mild weight shift left during sitting Timed up and go (TUG): 11.28 sec SBA, no instability in turning with small radius 10 meter walk test: 8.50 sec =  1.18 m/sec OR 3.88 ft/sec Functional gait assessment: To be assessed.  PATIENT SURVEYS:  None completed due to time.                                                                                                                              TREATMENT DATE: 05/25/2024  Aquatic therapy at [facility] - pool temperature 92 degrees   Patient seen for aquatic therapy today.  Treatment took place in water 3.6-4.8 feet deep depending upon activity.  Patient entered and exited the pool via stairs using reciprocal pattern and left rail at mod I level.   Exercises: Water walking warmup unsupported 4x18 ft forward > backward > laterally Stretching: Seated piriformis 3x45 seconds each side Long-sitting hamstring stretch 2x1 minute each side Bilateral gastroc stretch on step 6x20 seconds Ai Chi: Postures 2-11, return demo and cues for improved form  Patient requires buoyancy of the water for support for reduced fall risk with gait training and balance exercises with multimodal cuing, pt is mod I. Exercises able to be performed safely in water without the risk of fall compared to those same exercises performed  on land; viscosity of water needed for resistance for strengthening. Current of water provides perturbations for challenging static and dynamic balance.   PATIENT EDUCATION: Education details:  Aquatic rationale. Person educated: Patient Education method: Explanation Education comprehension: verbalized understanding and needs further education  HOME EXERCISE PROGRAM: Access Code: [email or web address] URL: [email or web address] Date: 04/17/2024 Prepared by: Daved Bull  Exercises - Sit to Stand with Armchair  - 1 x daily - 7 x weekly - 3 sets - 10 reps - Corner Balance Feet Together With Eyes Open  - 1 x daily - 5 x weekly - 1 sets - 3-4 reps - 30 seconds hold - Corner Balance Feet Together With Eyes Closed  - 1 x daily - 5 x weekly - 1 sets - 3-4 reps - 30 seconds hold - Corner  Balance Feet Together: Eyes Open With Head Turns  - 1 x daily - 5 x weekly - 1 sets - 3-4 reps - 30 seconds hold - Romberg Stance with Head Nods  - 1 x daily - 5 x weekly - 2-3 sets - 10 reps - Tandem Walking with Counter Support  - 1 x daily - 5 x weekly - 3 sets - 10 reps - Side Stepping with Resistance at Thighs and Counter Support  - 1 x daily - 4 x weekly - 3 sets - 10 reps - Forward Backward Monster Walk with Band at Thighs and Counter Support  - 1 x daily - 4 x weekly - 3 sets - 10 reps - Squat with Counter Support  - 1 x daily - 4 x weekly - 2 sets - 10 reps  Chair yoga poses reviewed 5/7  GOALS: Goals reviewed with patient? Yes  SHORT TERM GOALS: Target date: 02/25/2024  Pt will be independent and compliant with initial strength and balance focused HEP in order to maintain functional progress and improve mobility. Baseline:  IND and compliant (5/7) Goal status: MET  2.  Pt will decrease 5xSTS to </=12 seconds w/ improved upright neutral in order to demonstrate decreased risk for falls and improved functional bilateral LE strength and power. Baseline: 15.13 sec w/ light BUE support and severe lumbar hyperextension on upright; 15.21 sec w/o UE support (5/7) Goal status: IN PROGRESS  LONG TERM GOALS: Target date: 03/24/2024  Pt will be independent and compliant with advanced and finalized strength and balance focused HEP in order to maintain functional progress and improve mobility. Baseline: Pt compliant and IND (5/28); updated (7/7) Goal status: MET  2.  Pt will demonstrate a gait speed of >/=4.08 feet/sec in order to decrease risk for falls. Baseline: 3.88 ft/sec; 3.37 ft/sec (5/28); 3.46 ft/sec (7/7) Goal status: PARTIALLY MET  3.  Pt will ambulate >/=500 feet with LRAD at no more than mod I level of assist over unlevel/compliant surfaces, curb step and ramp with improved trunk stability to promote household and community access. Baseline: instability over level ground/SBA;  IND ramp/curb/500 ft over unlevel sidewalk and grass w/ only mild lateral wobble w/ transitions (5/28) Goal status: MET  4.  Pt will improve FGA score to >/=19/30 in order to demonstrate improved balance and decreased fall risk. Baseline: 13/30 (4/11); 25/30 (5/28) Goal status: MET  GOALS:   Goals reviewed with patient? Yes SHORT TERM GOALS = LONG TERM GOALS: Target date: 06/16/2024  Pt will ambulate >/=1189 feet on IND to demonstrate improved functional endurance for home and community participation. Baseline: 989 ft SBA due to left stumble Goal status: INITIAL  2.  Pt will demonstrate a gait speed of >/=3.66 feet/sec in order to decrease risk for falls. Baseline: 3.88 ft/sec; 3.37 ft/sec (5/28); 3.46 ft/sec (7/7) Goal status: REVISED  ASSESSMENT:  CLINICAL IMPRESSION: Patient seen for aquatic session at [facility] facility today.  Focus of skilled session on continued pain and tone management to facilitate high level activity tolerance.  She was primarily challenged by progression to single leg standing with ai chi postures.  PT to continue reviewing these as they assist with core engagement and fluidity of movement.  She decided she would like an aquatic HEP this visit so PT will prepare and provide at discharge visit next week.  Continue per POC.  OBJECTIVE IMPAIRMENTS: Abnormal gait, decreased activity tolerance, decreased balance, decreased coordination, decreased knowledge of condition, decreased knowledge of use of DME, decreased strength, increased muscle spasms, impaired sensation, improper body mechanics, postural dysfunction, and pain.   ACTIVITY LIMITATIONS: carrying, lifting, bending, standing, squatting, stairs, transfers, bed mobility, continence, bathing, dressing, self feeding, reach over head, and locomotion level  PARTICIPATION LIMITATIONS: meal prep, cleaning, laundry, driving, community activity, and occupation  PERSONAL FACTORS: Fitness, Past/current experiences,  Transportation, and 3+ comorbidities: HTN, fibromyalgia, DM2 are also affecting patient's functional outcome.   REHAB POTENTIAL: Good  CLINICAL DECISION MAKING: Evolving/moderate complexity  EVALUATION COMPLEXITY: Moderate  PLAN:  PT FREQUENCY: 1x/week + 2x/wk (1x in aquatics and 1x on land)  PT DURATION: 8 weeks + 4 wks  PLANNED INTERVENTIONS: 97164- PT Re-evaluation, 97750- Physical Performance Testing, 97110-Therapeutic exercises, 97530- Therapeutic activity, [id number]- Neuromuscular re-education, 97535- Self Care, [id number]- Manual therapy, [id number]- Gait training, [phone]- Orthotic Initial, [phone]- Aquatic Therapy, [phone]- Electrical stimulation (manual), Patient/Family education, Balance training, Stair training, Taping, Dry Needling, Joint mobilization, Spinal mobilization, Vestibular training, DME instructions, Cryotherapy, and Moist heat  PLAN FOR NEXT SESSION:    ASSESS LTGs - D/C!  Provide aquatic HEP and ai chi!  Daved KATHEE Bull, PT, DPT 05/25/2024, 9:51 AM

## 2024-05-26 LAB — LIPID PANEL
Chol/HDL Ratio: 4.9 ratio — ABNORMAL HIGH (ref 0.0–4.4)
Cholesterol, Total: 251 mg/dL — ABNORMAL HIGH (ref 100–199)
HDL: 51 mg/dL (ref >39)
LDL Chol Calc (NIH): 177 mg/dL — ABNORMAL HIGH (ref 0–99)
Triglycerides: 130 mg/dL (ref 0–149)
VLDL Cholesterol Cal: 23 mg/dL (ref 5–40)

## 2024-05-29 ENCOUNTER — Encounter: Payer: Self-pay | Admitting: Obstetrics and Gynecology

## 2024-05-29 ENCOUNTER — Other Ambulatory Visit (HOSPITAL_COMMUNITY)
Admission: RE | Admit: 2024-05-29 | Discharge: 2024-05-29 | Disposition: A | Discharge status: Home / Self Care | Source: Ambulatory Visit | Attending: Obstetrics and Gynecology | Admitting: Obstetrics and Gynecology

## 2024-05-29 ENCOUNTER — Ambulatory Visit (INDEPENDENT_AMBULATORY_CARE_PROVIDER_SITE_OTHER): Admitting: Obstetrics and Gynecology

## 2024-05-29 ENCOUNTER — Other Ambulatory Visit: Payer: Self-pay | Admitting: Obstetrics and Gynecology

## 2024-05-29 VITALS — BP 151/99 | HR 78 | Wt 156.0 lb

## 2024-05-29 DIAGNOSIS — N898 Other specified noninflammatory disorders of vagina: Secondary | ICD-10-CM

## 2024-05-29 DIAGNOSIS — N939 Abnormal uterine and vaginal bleeding, unspecified: Secondary | ICD-10-CM

## 2024-05-29 DIAGNOSIS — R350 Frequency of micturition: Secondary | ICD-10-CM | POA: Diagnosis not present

## 2024-05-29 MED ORDER — FREESTYLE LIBRE 3 READER DEVI
1.0000 | 0 refills | Status: AC
Start: 2024-05-29 — End: ?

## 2024-05-29 MED ORDER — TESTOSTERONE 10 MG/ACT (2%) TD GEL
TRANSDERMAL | 2 refills | Status: DC
Start: 2024-05-29 — End: 2024-06-22

## 2024-05-29 NOTE — Progress Notes (Signed)
 Pt states she has had some changes since last visit.  Pt had been on Aygestin  and recently stopped since she had cycle.  Pt had a cycle x 6 days and no bleeding since.   Lobular lesion noted on exam last visit- would like check today.  Pt states she would like to discus some hormone check and decrease libido.   Pt does have increase in d/c and would like check today.

## 2024-05-29 NOTE — Progress Notes (Signed)
 48 yo P3 with LMP 04/25/24 and BMI 27 who is here to follow up on AUB and cervical exam. Patient reports experiencing a normal cycle in July lasting 6 days. She discontinued Aygestin  and is not interested in restarting at this time. Patient is no longer interested in a D&C as she is hopeful that her normal cycle has returned. Patient also reports decrease libido and desires testosterone . Patient reports the presence of a vaginal discharge without odor or pruritus and is requesting testing. Patient reports urinary frequency without urgency and dysuria. Patient is without any other complaints  Past Medical History:  Diagnosis Date   Abnormal Pap smear of cervix    yrs ago   Adenomyosis    Allergy     Anemia    Anxiety    Aortic atherosclerosis (HCC)    Arthritis    Asthma    Chronic kidney disease    Chronic UTI    Clostridium difficile infection    Depression    Diabetes mellitus without complication (HCC) 10/22/2023   A1C 8.8   Diverticulosis    Endometriosis    Fibroid    Fibromyalgia    GERD (gastroesophageal reflux disease)    Hiatal hernia    Hyperlipidemia    Hypertension    Internal hemorrhoids    Meningitis, viral    Migraines    Pancreatitis    Pure hypercholesterolemia 12/23/2020   Tubular adenoma of colon    Past Surgical History:  Procedure Laterality Date   APPENDECTOMY  1990   CERVICAL BIOPSY  W/ LOOP ELECTRODE EXCISION     LEEP   CESAREAN SECTION  2002   CHOLECYSTECTOMY  2011   COLONOSCOPY     UPPER GASTROINTESTINAL ENDOSCOPY     URINARY SURGERY     urethra sling, removal, and then revision 2016 (4 surgeries)   WISDOM TOOTH EXTRACTION     Family History  Problem Relation Age of Onset   Cancer Mother        cervical, breast, lung, skin, bladder-ex smoker   Hypertension Mother    Diabetes Father    Heart attack Maternal Grandfather    Multiple sclerosis Paternal Grandmother    Social History   Tobacco Use   Smoking status: Former    Current  packs/day: 0.10    Average packs/day: 0.1 packs/day for 20.0 years (2.0 ttl pk-yrs)    Types: Cigarettes   Smokeless tobacco: Never   Tobacco comments:    Patient is engaged in health coaching for smoking cessation as of 12/26/20. Patient stated that she has stopped smoking, drinking and any form of drug usage since her stroke.  Vaping Use   Vaping status: Never Used  Substance Use Topics   Alcohol use: Not Currently    Comment: occ   Drug use: Not Currently    Types: Marijuana    Comment: occ   ROS See pertinent in HPI. All other systems reviewed and non contributory Blood pressure (!) 151/99, pulse 78, weight 156 lb (70.8 kg), last menstrual period 04/25/2024. GENERAL: Well-developed, well-nourished female in no acute distress.  ABDOMEN: Soft, nontender, nondistended. No organomegaly. PELVIC: Normal external female genitalia. Vagina is pink and rugated.  Normal discharge. Normal appearing cervix. Uterus is normal in size. No adnexal mass or tenderness. Chaperone present during the pelvic exam EXTREMITIES: No cyanosis, clubbing, or edema, 2+ distal pulses.  A/P 48 yo here for follow up - Vaginal swab collected to rule out vaginitis - Urine culture collected - patient encouraged  to keep a menstrual calendar. Discussed ablation as a better option than a simple D&C if needed - Reassurance provided regarding appearance of cervix as it seems to be related to a cervical trauma- patient later reports forceps delivery associated with poor healing - Rx testosterone  gel provided to help with libido - RTC prn

## 2024-05-30 ENCOUNTER — Ambulatory Visit: Payer: Self-pay | Admitting: Physical Therapy

## 2024-05-30 LAB — CERVICOVAGINAL ANCILLARY ONLY
Bacterial Vaginitis (gardnerella): NEGATIVE
Candida Glabrata: NEGATIVE
Candida Vaginitis: NEGATIVE
Comment: NEGATIVE
Comment: NEGATIVE
Comment: NEGATIVE
Comment: NEGATIVE
Trichomonas: NEGATIVE

## 2024-05-31 LAB — URINE CULTURE: Organism ID, Bacteria: NO GROWTH

## 2024-06-02 ENCOUNTER — Encounter: Payer: Self-pay | Admitting: Physical Therapy

## 2024-06-02 ENCOUNTER — Ambulatory Visit: Attending: Physician Assistant | Admitting: Physical Therapy

## 2024-06-02 VITALS — BP 164/94 | HR 63

## 2024-06-02 DIAGNOSIS — R208 Other disturbances of skin sensation: Secondary | ICD-10-CM | POA: Diagnosis not present

## 2024-06-02 DIAGNOSIS — R2689 Other abnormalities of gait and mobility: Secondary | ICD-10-CM | POA: Insufficient documentation

## 2024-06-02 DIAGNOSIS — R278 Other lack of coordination: Secondary | ICD-10-CM | POA: Diagnosis not present

## 2024-06-02 DIAGNOSIS — M6281 Muscle weakness (generalized): Secondary | ICD-10-CM | POA: Insufficient documentation

## 2024-06-02 DIAGNOSIS — R29898 Other symptoms and signs involving the musculoskeletal system: Secondary | ICD-10-CM | POA: Insufficient documentation

## 2024-06-02 DIAGNOSIS — R29818 Other symptoms and signs involving the nervous system: Secondary | ICD-10-CM | POA: Insufficient documentation

## 2024-06-02 NOTE — Patient Instructions (Signed)
 Aquatic HEP:  Access Code: [email or web address] URL: [email or web address] Date: 06/02/2024 Prepared by: Daved Bull  Exercises - Standing Hip Abduction Adduction at [facility]  - 1 x daily - 7 x weekly - 3 sets - 10 reps - Standing Hip Flexion Extension at [employer]  - 1 x daily - 7 x weekly - 3 sets - 10 reps - Backward March  - 1 x daily - 7 x weekly - 3 sets - 10 reps - Side Stepping  - 1 x daily - 7 x weekly - 3 sets - 10 reps - Forward Walking  - 1 x daily - 7 x weekly - 3 sets - 10 reps - Squat  - 1 x daily - 7 x weekly - 3 sets - 10 reps - Backward Walking  - 1 x daily - 7 x weekly - 3 sets - 10 reps - Heel Toe Raises at Pool Wall  - 1 x daily - 7 x weekly - 3 sets - 10 reps - Bilateral Shoulder Flexion Extension with Hand Floats at [employer]  - 1 x daily - 7 x weekly - 3 sets - 10 reps - Side to Side Hamstring Stretch with Noodle at [employer]  - 1 x daily - 7 x weekly - 3 sets - 10 reps - Pec Stretch at [employer]  - 1 x daily - 7 x weekly - 3 sets - 10 reps - Wide Stance on Pool Noodle  - 1 x daily - 7 x weekly - 3 sets - 10 reps

## 2024-06-02 NOTE — Therapy (Signed)
 OUTPATIENT PHYSICAL THERAPY NEURO TREATMENT - DISCHARGE SUMMARY   Patient Name: Amber Rose MRN: [id number] DOB:[birth year], 48 y.o., female Today's Date: 06/02/2024   PCP: Delbert Clam, MD REFERRING PROVIDER: Danton Jon HERO, PA-C  PHYSICAL THERAPY DISCHARGE SUMMARY  Visits from Start of Care: 14  Current functional level related to goals / functional outcomes: See clinical impression statement   Remaining deficits: Primarily impaired right sensation/spasms   Education / Equipment: BP monitoring.  She reports difficulty with depth perception and driving (hitting curbs) and recommended neuro-ophthalmology or driver rehab - she is open to eye doctor more than driver rehab.  Aquatic HEP.  Discussed plan for discharge, progress towards goals and progression to WNL for gait speed and walking tolerance.  Encouraged her to maintain her aquatic routine as previously planned as she is independent and capable of accessing a pool environment.   Patient agrees to discharge. Patient goals were partially met. Patient is being discharged due to maximized rehab potential.    END OF SESSION:  PT End of Session - 06/02/24 1453     Visit Number 14    Number of Visits 22   17+5   Date for PT Re-Evaluation 06/16/24   pushed out due to future multi-disciplinary and aquatic needs at 7/7 re-cert   Authorization Type [employer] dual complete    Progress Note Due on Visit 10    PT Start Time 1447    PT Stop Time 1525    PT Time Calculation (min) 38 min    Equipment Utilized During Treatment Gait belt    Activity Tolerance Patient tolerated treatment well    Behavior During Therapy WFL for tasks assessed/performed           Past Medical History:  Diagnosis Date   Abnormal Pap smear of cervix    yrs ago   Adenomyosis    Allergy     Anemia    Anxiety    Aortic atherosclerosis (HCC)    Arthritis    Asthma    Chronic kidney disease    Chronic UTI    Clostridium difficile infection     Depression    Diabetes mellitus without complication (HCC) 10/22/2023   A1C 8.8   Diverticulosis    Endometriosis    Fibroid    Fibromyalgia    GERD (gastroesophageal reflux disease)    Hiatal hernia    Hyperlipidemia    Hypertension    Internal hemorrhoids    Meningitis, viral    Migraines    Pancreatitis    Pure hypercholesterolemia 12/23/2020   Tubular adenoma of colon    Past Surgical History:  Procedure Laterality Date   APPENDECTOMY  1990   CERVICAL BIOPSY  W/ LOOP ELECTRODE EXCISION     LEEP   CESAREAN SECTION  2002   CHOLECYSTECTOMY  2011   COLONOSCOPY     UPPER GASTROINTESTINAL ENDOSCOPY     URINARY SURGERY     urethra sling, removal, and then revision 2016 (4 surgeries)   WISDOM TOOTH EXTRACTION     Patient Active Problem List   Diagnosis Date Noted   CVA (cerebral vascular accident) (HCC) 01/08/2024   Leukocytosis 01/08/2024   Type 2 diabetes mellitus with chronic kidney disease, with long-term current use of insulin  (HCC) 01/08/2024   Hyperlipidemia 01/08/2024   GERD (gastroesophageal reflux disease) 01/08/2024   Overweight (BMI 25.0-29.9) 01/08/2024   Moderately severe major depression (HCC) 01/06/2024   Yeast vaginitis 08/30/2023   Iron  deficiency anemia 03/11/2023   PMDD (premenstrual  dysphoric disorder) 11/17/2022   Abnormal uterine bleeding (AUB) 11/17/2022   Statin myopathy 09/23/2022   Steatorrhea 08/17/2022   Irregular menses 11/28/2021   Perimenopause 11/28/2021   Episode of recurrent major depressive disorder (HCC) 04/09/2021   BMI 33.0-33.9,adult 04/09/2021   Pure hypercholesterolemia 12/23/2020   Menorrhagia with regular cycle 09/13/2020   Well woman exam 09/13/2020   History of herpes zoster 01/17/2018   Overactive bladder 12/28/2017   Recurrent infections 11/19/2017   Cyst of nasal cavity 11/19/2017   Angio-edema 11/19/2017   Abdominal pain, epigastric 11/04/2017   Dysphagia 11/04/2017   Gastroesophageal reflux disease 07/12/2017    Degenerative disc disease at L5-S1 level 05/14/2017   Fibromyalgia 05/14/2017   ADD (attention deficit disorder) 05/14/2017   Urinary incontinence 04/05/2017   Thigh pain 02/02/2017   Anxiety and depression 02/02/2017   Tobacco abuse 02/02/2017   Facial flushing 02/02/2017   Hypertension 10/23/2016   Hidradenitis 10/23/2016   Diabetes (HCC) 09/12/2014   Lupus anticoagulant positive 07/03/2014   Vitamin D  deficiency 06/06/2014    ONSET DATE: 01/07/2024 (Lacunar CVA)  REFERRING DIAG: G81.91 (ICD-10-CM) - Right hemiparesis (HCC) I63.9 (ICD-10-CM) - Cerebrovascular accident (CVA), unspecified mechanism (HCC)  THERAPY DIAG:  Muscle weakness (generalized)  Other lack of coordination  Other disturbances of skin sensation  Other symptoms and signs involving the nervous system  Other symptoms and signs involving the musculoskeletal system  Other abnormalities of gait and mobility  Rationale for Evaluation and Treatment: Rehabilitation  SUBJECTIVE:                                                                                                                                                                                             SUBJECTIVE STATEMENT: Patient reports her BP recently has been sky high and she spiked her blood glucose by accident today by eating one pancake.  She presents to clinic alone without AD. Pt accompanied by: self (drove herself)  PERTINENT HISTORY: HTN, anxiety, depression, fibromyalgia, herpes zoster, DM2, statin intolerance, thalamic CVA, CKD  PAIN:  Are you having pain? Yes: NPRS scale: 10 Pain location: right hemibody Pain description: tingling/cramping Aggravating factors: using the arm, laying on the right Relieving factors: sitting still  PRECAUTIONS: Fall  RED FLAGS: Bowel or bladder incontinence: Yes: urinary incontinence - wearing depends today and MD aware per report   WEIGHT BEARING RESTRICTIONS: No  FALLS: Has patient fallen  in last 6 months? No  LIVING ENVIRONMENT: Lives with: lives with an adult companion Lives in: House/apartment Stairs: Yes: External: 3 steps; none Has following equipment at home: Grab bars  PLOF: Independent with gait, Independent with transfers,  Needs assistance with ADLs, and Needs assistance with homemaking  PATIENT GOALS: Everything is really up top so I don't really know about PT.  OBJECTIVE:  Note: Objective measures were completed at Evaluation unless otherwise noted.  DIAGNOSTIC FINDINGS:  MR Brain 01/08/2024: IMPRESSION: Acute lacunar infarct in the left thalamus.  COGNITION: Overall cognitive status: pt reports some difficulty with memory/losing her place with tasks due to focus   SENSATION: Light touch: WFL - pt reports decreased R > L  COORDINATION: LE RAMS;  impaired Heel-to-shin:  WNL bilaterally  EDEMA:  WNL  MUSCLE TONE: Difficulty discerning flexion spasticity due to pt difficulty relaxing  POSTURE: rounded shoulders and forward head  LOWER EXTREMITY ROM:     Active  Right Eval Left Eval  Hip flexion WNL WNL  Hip extension    Hip abduction    Hip adduction    Hip internal rotation    Hip external rotation    Knee flexion    Knee extension    Ankle dorsiflexion    Ankle plantarflexion    Ankle inversion    Ankle eversion     (Blank rows = not tested)  LOWER EXTREMITY MMT:    MMT Right Eval Left Eval  Hip flexion 4-/5 4+/5  Hip extension    Hip abduction    Hip adduction    Hip internal rotation    Hip external rotation    Knee flexion 4-/5 4+/5  Knee extension 4/5 5/5  Ankle dorsiflexion 4+/5 5/5  Ankle plantarflexion    Ankle inversion    Ankle eversion    (Blank rows = not tested)  BED MOBILITY:  Pt sleeps in standard bed and requires increased time and momentum to get roll and has difficulty managing bedding  TRANSFERS: Assistive device utilized: None  Sit to stand: SBA Stand to sit: SBA Chair to chair:  SBA  GAIT: Gait pattern: bilateral ER, step through pattern, decreased stride length, decreased hip/knee flexion- Right, decreased hip/knee flexion- Left, decreased ankle dorsiflexion- Right, decreased ankle dorsiflexion- Left, trendelenburg, and wide BOS Distance walked: various clinic distances Assistive device utilized: None Level of assistance: SBA Comments: Pt has severe drift from pathway to left then right when entering clinic, increased time to correct path with cuing.  Truncal ataxia?  FUNCTIONAL TESTS:  5 times sit to stand: 15.13 sec w/ light BUE support, severe hyperextension in standing, mild weight shift left during sitting Timed up and go (TUG): 11.28 sec SBA, no instability in turning with small radius 10 meter walk test: 8.50 sec = 1.18 m/sec OR 3.88 ft/sec Functional gait assessment: To be assessed.  PATIENT SURVEYS:  None completed due to time.                                                                                                                              TREATMENT DATE: 06/02/2024  LUE BP in sitting prior to goal assessment: Today's Vitals   06/02/24  1450  BP: (!) 164/94  Pulse: 63   -Provided copies of aquatic HEP and ai chi - answered questions for pt:  Access Code: [email or web address] URL: [email or web address] Date: 06/02/2024 Prepared by: Daved Bull  Exercises - Standing Hip Abduction Adduction at [facility]  - 1 x daily - 7 x weekly - 3 sets - 10 reps - Standing Hip Flexion Extension at [employer]  - 1 x daily - 7 x weekly - 3 sets - 10 reps - Backward March  - 1 x daily - 7 x weekly - 3 sets - 10 reps - Side Stepping  - 1 x daily - 7 x weekly - 3 sets - 10 reps - Forward Walking  - 1 x daily - 7 x weekly - 3 sets - 10 reps - Squat  - 1 x daily - 7 x weekly - 3 sets - 10 reps - Backward Walking  - 1 x daily - 7 x weekly - 3 sets - 10 reps - Heel Toe Raises at Pool Wall  - 1 x daily - 7 x weekly - 3 sets - 10 reps - Bilateral  Shoulder Flexion Extension with Hand Floats at [employer]  - 1 x daily - 7 x weekly - 3 sets - 10 reps - Side to Side Hamstring Stretch with Noodle at [employer]  - 1 x daily - 7 x weekly - 3 sets - 10 reps - Pec Stretch at [employer]  - 1 x daily - 7 x weekly - 3 sets - 10 reps - Wide Stance on Pool Noodle  - 1 x daily - 7 x weekly - 3 sets - 10 reps  - : 1142 ft - : 7.94 sec SBA no AD = 1.26 m/sec OR 4.16 ft/sec  PATIENT EDUCATION: Education details:  BP monitoring.  She reports difficulty with depth perception and driving (hitting curbs) and recommended neuro-ophthalmology or driver rehab - she is open to eye doctor more than driver rehab.  Aquatic HEP.  Discussed plan for discharge, progress towards goals and progression to WNL for gait speed and walking tolerance.  Encouraged her to maintain her aquatic routine as previously planned as she is independent and capable of accessing a pool environment. Person educated: Patient Education method: Explanation Education comprehension: verbalized understanding and needs further education  HOME EXERCISE PROGRAM: Access Code: [email or web address] URL: [email or web address] Date: 04/17/2024 Prepared by: Daved Bull  Exercises - Sit to Stand with Armchair  - 1 x daily - 7 x weekly - 3 sets - 10 reps - Corner Balance Feet Together With Eyes Open  - 1 x daily - 5 x weekly - 1 sets - 3-4 reps - 30 seconds hold - Corner Balance Feet Together With Eyes Closed  - 1 x daily - 5 x weekly - 1 sets - 3-4 reps - 30 seconds hold - Corner Balance Feet Together: Eyes Open With Head Turns  - 1 x daily - 5 x weekly - 1 sets - 3-4 reps - 30 seconds hold - Romberg Stance with Head Nods  - 1 x daily - 5 x weekly - 2-3 sets - 10 reps - Tandem Walking with Counter Support  - 1 x daily - 5 x weekly - 3 sets - 10 reps - Side Stepping with Resistance at Thighs and Counter Support  - 1 x daily - 4 x weekly - 3 sets - 10 reps - Forward Backward Monster Walk  with Band at [employer]  -  1 x daily - 4 x weekly - 3 sets - 10 reps - Squat with Counter Support  - 1 x daily - 4 x weekly - 2 sets - 10 reps  Chair yoga poses reviewed 5/7  AQUATIC HEP: Access Code: [email or web address] URL: [email or web address] Date: 06/02/2024 Prepared by: Daved Bull  Exercises - Standing Hip Abduction Adduction at [facility]  - 1 x daily - 7 x weekly - 3 sets - 10 reps - Standing Hip Flexion Extension at [employer]  - 1 x daily - 7 x weekly - 3 sets - 10 reps - Backward March  - 1 x daily - 7 x weekly - 3 sets - 10 reps - Side Stepping  - 1 x daily - 7 x weekly - 3 sets - 10 reps - Forward Walking  - 1 x daily - 7 x weekly - 3 sets - 10 reps - Squat  - 1 x daily - 7 x weekly - 3 sets - 10 reps - Backward Walking  - 1 x daily - 7 x weekly - 3 sets - 10 reps - Heel Toe Raises at Pool Wall  - 1 x daily - 7 x weekly - 3 sets - 10 reps - Bilateral Shoulder Flexion Extension with Hand Floats at [employer]  - 1 x daily - 7 x weekly - 3 sets - 10 reps - Side to Side Hamstring Stretch with Noodle at [employer]  - 1 x daily - 7 x weekly - 3 sets - 10 reps - Pec Stretch at [employer]  - 1 x daily - 7 x weekly - 3 sets - 10 reps - Wide Stance on Pool Noodle  - 1 x daily - 7 x weekly - 3 sets - 10 reps  GOALS: Goals reviewed with patient? Yes  SHORT TERM GOALS: Target date: 02/25/2024  Pt will be independent and compliant with initial strength and balance focused HEP in order to maintain functional progress and improve mobility. Baseline:  IND and compliant (5/7) Goal status: MET  2.  Pt will decrease 5xSTS to </=12 seconds w/ improved upright neutral in order to demonstrate decreased risk for falls and improved functional bilateral LE strength and power. Baseline: 15.13 sec w/ light BUE support and severe lumbar hyperextension on upright; 15.21 sec w/o UE support (5/7) Goal status: IN PROGRESS  LONG TERM GOALS: Target date: 03/24/2024  Pt will be  independent and compliant with advanced and finalized strength and balance focused HEP in order to maintain functional progress and improve mobility. Baseline: Pt compliant and IND (5/28); updated (7/7) Goal status: MET  2.  Pt will demonstrate a gait speed of >/=4.08 feet/sec in order to decrease risk for falls. Baseline: 3.88 ft/sec; 3.37 ft/sec (5/28); 3.46 ft/sec (7/7) Goal status: PARTIALLY MET  3.  Pt will ambulate >/=500 feet with LRAD at no more than mod I level of assist over unlevel/compliant surfaces, curb step and ramp with improved trunk stability to promote household and community access. Baseline: instability over level ground/SBA; IND ramp/curb/500 ft over unlevel sidewalk and grass w/ only mild lateral wobble w/ transitions (5/28) Goal status: MET  4.  Pt will improve FGA score to >/=19/30 in order to demonstrate improved balance and decreased fall risk. Baseline: 13/30 (4/11); 25/30 (5/28) Goal status: MET  GOALS:   Goals reviewed with patient? Yes SHORT TERM GOALS = LONG TERM GOALS: Target date: 06/16/2024  Pt will ambulate >/=1189 feet on IND to demonstrate improved  functional endurance for home and community participation. Baseline: 989 ft SBA due to left stumble; 1142 ft CGA due to sandals (8/8) Goal status: PARTIALLY MET  2.  Pt will demonstrate a gait speed of >/=3.66 feet/sec in order to decrease risk for falls. Baseline: 3.88 ft/sec; 3.37 ft/sec (5/28); 3.46 ft/sec (7/7); 4.16 ft/sec SBA no AD (8/8) Goal status: MET  ASSESSMENT:  CLINICAL IMPRESSION: Assessed LTGs this session with pt improving gait speed to WNL for community ambulation.  She continues to have high levels of pain and fluctuation of activity tolerance based on pain and spasms that have had limited response to land or aquatic therapy.  Established aquatic HEP today as pt expressed interest at last session in this as she plans to continue on her own.  She has demonstrated independence in  setting and with HEP has no further needs for skilled intervention in aquatic setting at this time.  She continues to decline medical management of BP and cholesterol and presents to clinic frequently with smell of cigarette smoke which has been a PT concern for ongoing lifestyle factors limiting safe progression secondary to hypertension.  From a functional capacity standpoint she is independent in mobility and has reached a plateau for physical therapy.  Will proceed with discharge.   OBJECTIVE IMPAIRMENTS: Abnormal gait, decreased activity tolerance, decreased balance, decreased coordination, decreased knowledge of condition, decreased knowledge of use of DME, decreased strength, increased muscle spasms, impaired sensation, improper body mechanics, postural dysfunction, and pain.   ACTIVITY LIMITATIONS: carrying, lifting, bending, standing, squatting, stairs, transfers, bed mobility, continence, bathing, dressing, self feeding, reach over head, and locomotion level  PARTICIPATION LIMITATIONS: meal prep, cleaning, laundry, driving, community activity, and occupation  PERSONAL FACTORS: Fitness, Past/current experiences, Transportation, and 3+ comorbidities: HTN, fibromyalgia, DM2 are also affecting patient's functional outcome.   REHAB POTENTIAL: Good  CLINICAL DECISION MAKING: Evolving/moderate complexity  EVALUATION COMPLEXITY: Moderate  PLAN:  PT FREQUENCY: 1x/week + 2x/wk (1x in aquatics and 1x on land)  PT DURATION: 8 weeks + 4 wks  PLANNED INTERVENTIONS: [id number]- PT Re-evaluation, 97750- Physical Performance Testing, 97110-Therapeutic exercises, 97530- Therapeutic activity, [id number]- Neuromuscular re-education, 97535- Self Care, [id number]- Manual therapy, [phone]- Gait training, [phone]- Orthotic Initial, [phone]- Aquatic Therapy, [phone]- Electrical stimulation (manual), Patient/Family education, Balance training, Stair training, Taping, Dry Needling, Joint mobilization, Spinal mobilization, Vestibular  training, DME instructions, Cryotherapy, and Moist heat  PLAN FOR NEXT SESSION:   N/A  Daved KATHEE Bull, PT, DPT 06/02/2024, 4:01 PM

## 2024-06-05 ENCOUNTER — Ambulatory Visit: Admitting: Dietician

## 2024-06-20 ENCOUNTER — Ambulatory Visit (HOSPITAL_BASED_OUTPATIENT_CLINIC_OR_DEPARTMENT_OTHER): Admitting: Pharmacist Clinician (PhC)/ Clinical Pharmacy Specialist

## 2024-06-22 ENCOUNTER — Other Ambulatory Visit: Payer: Self-pay

## 2024-06-22 ENCOUNTER — Encounter: Payer: Self-pay | Admitting: Obstetrics and Gynecology

## 2024-06-22 MED ORDER — TESTOSTERONE 10 MG/ACT (2%) TD GEL: TRANSDERMAL | 2 refills | Status: DC

## 2024-06-22 NOTE — Progress Notes (Signed)
 PDMP review performed for testosterone  rx as RN was trying to correct Dr. Keven rx however the compounding pharmacy was able to fill the rx without a new rx from me.

## 2024-06-22 NOTE — Progress Notes (Signed)
 Testosterone  rx resent due to pharmacy issues.

## 2024-06-26 ENCOUNTER — Encounter: Payer: Self-pay | Admitting: "Endocrinology

## 2024-06-29 ENCOUNTER — Ambulatory Visit (INDEPENDENT_AMBULATORY_CARE_PROVIDER_SITE_OTHER): Admitting: "Endocrinology

## 2024-06-29 ENCOUNTER — Encounter: Payer: Self-pay | Admitting: "Endocrinology

## 2024-06-29 VITALS — BP 130/80 | HR 84 | Ht 63.0 in | Wt 155.0 lb

## 2024-06-29 DIAGNOSIS — E782 Mixed hyperlipidemia: Secondary | ICD-10-CM | POA: Diagnosis not present

## 2024-06-29 DIAGNOSIS — Z7985 Long-term (current) use of injectable non-insulin antidiabetic drugs: Secondary | ICD-10-CM

## 2024-06-29 DIAGNOSIS — Z7984 Long term (current) use of oral hypoglycemic drugs: Secondary | ICD-10-CM | POA: Diagnosis not present

## 2024-06-29 DIAGNOSIS — E11649 Type 2 diabetes mellitus with hypoglycemia without coma: Secondary | ICD-10-CM | POA: Diagnosis not present

## 2024-06-29 MED ORDER — RYBELSUS 14 MG PO TABS: 14.0000 mg | ORAL_TABLET | Freq: Every day | ORAL | 1 refills | Status: AC

## 2024-06-29 NOTE — Patient Instructions (Signed)

## 2024-06-29 NOTE — Progress Notes (Signed)
 Outpatient Endocrinology Note Obadiah Birmingham, MD  06/29/24   Amber Rose 1975/12/05 [id number]  Referring Provider: Delbert Clam, MD Primary Care Provider: Delbert Clam, MD Reason for consultation: Subjective   Assessment & Plan  Diagnoses and all orders for this visit:  Uncontrolled type 2 diabetes mellitus with hypoglycemia without coma (HCC)  Long term (current) use of oral hypoglycemic drugs  Long-term (current) use of injectable non-insulin  antidiabetic drugs  Mixed hypercholesterolemia and hypertriglyceridemia  Other orders -     Semaglutide  (RYBELSUS ) 14 MG TABS; Take 1 tablet (14 mg total) by mouth daily.   Diabetes Type II complicated by hyperglycemia,  Lab Results  Component Value Date   GFR 106.09 02/20/2022   Hba1c goal less than 7, current Hba1c is  Lab Results  Component Value Date   HGBA1C 6.7 (A) 04/13/2024   Will recommend the following: Metformin  500mg  2 tabs bid Start rybelsus  14 mg qam   06/29/24: Stop Ozempic  1 mg per week, S/E of abdominal pain, poor appetite, excess weight loss  Humalog  scale: Use it 15 min before you eat: Space it out by 4 hours.  201 - 225: 3 units 226 - 250: 4 units 251 - 275: 5 units 276 - 300: 6 units 301 - 325: 7 units 326 - 350: 8 units 351 - 375: 9 units 376 - 400: 10 units   Ordered DM education previously   No known contraindications/side effects to any of above medications No history of MEN syndrome/medullary thyroid  cancer/pancreatitis or pancreatic cancer in self or family Stopped ozempic  2mg  3 mo ago due to weight loss-lost 30-40 lbs but now wants it again Stopped Lantus  14 units qpm   -Last LD and Tg are as follows: Lab Results  Component Value Date   LDLCALC 177 (H) 05/25/2024    Lab Results  Component Value Date   TRIG 130 05/25/2024   -not on statin, not interested after detailed discussion, says she has the shot approved, scared to take it-counseled pt -Follow low fat diet  and exercise   -Blood pressure goal <140/90 - Microalbumin/creatinine goal is < 30 -Last MA/Cr is as follows: Lab Results  Component Value Date   MICROALBUR 1.3 11/22/2023   -not on ACE/ARB  -diet changes including salt restriction -limit eating outside -counseled BP targets per standards of diabetes care -uncontrolled blood pressure can lead to retinopathy, nephropathy and cardiovascular and atherosclerotic heart disease  Reviewed and counseled on: -A1C target -Blood sugar targets -Complications of uncontrolled diabetes  -Checking blood sugar before meals and bedtime and bring log next visit -All medications with mechanism of action and side effects -Hypoglycemia management: rule of 15's, Glucagon  Emergency Kit and medical alert ID -low-carb low-fat plate-method diet -At least 20 minutes of physical activity per day -Annual dilated retinal eye exam and foot exam -compliance and follow up needs -follow up as scheduled or earlier if problem gets worse  Call if blood sugar is less than 70 or consistently above 250    Take a 15 gm snack of carbohydrate at bedtime before you go to sleep if your blood sugar is less than 100.    If you are going to fast after midnight for a test or procedure, ask your physician for instructions on how to reduce/decrease your insulin  dose.    Call if blood sugar is less than 70 or consistently above 250  -Treating a low sugar by rule of 15  (15 gms of sugar every 15 min until sugar is  more than 70) If you feel your sugar is low, test your sugar to be sure If your sugar is low (less than 70), then take 15 grams of a fast acting Carbohydrate (3-4 glucose tablets or glucose gel or 4 ounces of juice or regular soda) Recheck your sugar 15 min after treating low to make sure it is more than 70 If sugar is still less than 70, treat again with 15 grams of carbohydrate          Don't drive the hour of hypoglycemia  If unconscious/unable to eat or drink by  mouth, use glucagon  injection or nasal spray baqsimi  and call 911. Can repeat again in 15 min if still unconscious.  Return in about 3 months (around 09/28/2024).   I have reviewed current medications, nurse's notes, allergies, vital signs, past medical and surgical history, family medical history, and social history for this encounter. Counseled patient on symptoms, examination findings, lab findings, imaging results, treatment decisions and monitoring and prognosis. The patient understood the recommendations and agrees with the treatment plan. All questions regarding treatment plan were fully answered.  Obadiah Birmingham, MD  06/29/24  History of Present Illness Amber Rose is a 48 y.o. year old female who presents for follow up of Type II diabetes mellitus.  Amber Rose was first diagnosed in around 2015.   Diabetes education +  Works at night 4 pm -7 am Sleeps [place]-5am, 8:30am-11:30am  8am is BF, lunch is around noon, dinner at 5:30pm and 9 pm is snack   Home diabetes regimen: Metformin  500mg  2 tabs bid Ozempic  1 mg per week   Stopped Lantus  18 units qpm Stopped ozempic  2mg  3 mo ago due to weight loss-lost 30-40 lbs   COMPLICATIONS -  MI/Stroke -  retinopathy -  neuropathy -  nephropathy   BLOOD SUGAR DATA CGM interpretation: At today's visit, we reviewed her CGM downloads. The full report is scanned in the media. Reviewing the CGM trends, BG are well controlled across the day with frequent lows scattered across the day.  Physical Exam  BP 130/80   Pulse 84   Ht 5' 3 (1.6 m)   Wt 155 lb (70.3 kg)   SpO2 96%   BMI 27.46 kg/m    Constitutional: well developed, well nourished Head: normocephalic, atraumatic Eyes: sclera anicteric, no redness Neck: supple Lungs: normal respiratory effort Neurology: alert and oriented Skin: dry, no appreciable rashes Musculoskeletal: no appreciable defects Psychiatric: normal mood and affect Diabetic Foot Exam - Simple    No data filed      Current Medications Patient's Medications  New Prescriptions   SEMAGLUTIDE  (RYBELSUS ) 14 MG TABS    Take 1 tablet (14 mg total) by mouth daily.  Previous Medications   ALBUTEROL  (PROAIR  HFA) 108 (90 BASE) MCG/ACT INHALER    Inhale 2 puffs into the lungs every 4 (four) hours as needed for wheezing or shortness of breath.   ASPIRIN  EC 81 MG TABLET    Take 1 tablet (81 mg total) by mouth daily. Swallow whole.   BACLOFEN  (LIORESAL ) 10 MG TABLET    Take 1 tablet (10 mg total) by mouth 3 (three) times daily as needed for muscle spasms.   BLOOD GLUCOSE MONITORING SUPPL (ACCU-CHEK AVIVA PLUS) W/DEVICE KIT    USE AS DIRECTED DAILY. E11.9   BUPROPION  (WELLBUTRIN  XL) 150 MG 24 HR TABLET    Take 1 tablet (150 mg total) by mouth daily.   CONTINUOUS GLUCOSE RECEIVER (FREESTYLE LIBRE 3 READER) DEVI  1 Device by Does not apply route continuous.   CONTINUOUS GLUCOSE SENSOR (FREESTYLE LIBRE 3 PLUS SENSOR) MISC    Inject 1 Device into the skin continuous. Change every 15 days   GLUCAGON  (BAQSIMI  ONE PACK) 3 MG/DOSE POWD    Place 1 Device into the nose as needed (Low blood sugar with impaired consciousness).   GLUCAGON  (GVOKE HYPOPEN  1-PACK) 1 MG/0.2ML SOAJ    Inject 1 mg into the skin as needed (low blood sugar with impaired consciousness).   GLUCOSE BLOOD (FREESTYLE TEST STRIPS) TEST STRIP    USE TO CHECK BLOOD SUGAR THREE TIMES DAILY. E11.49   HYDROXYZINE  (ATARAX ) 50 MG TABLET    Take 1 tablet (50 mg total) by mouth 3 (three) times daily as needed.   INDAPAMIDE  (LOZOL ) 1.25 MG TABLET    Take 1 tablet (1.25 mg total) by mouth daily.   INSULIN  GLARGINE (LANTUS  SOLOSTAR) 100 UNIT/ML SOLOSTAR PEN    Inject 25 Units into the skin daily. Increase by 2 units to a maximum daily dose of 30 units every 4th day until blood sugars are at goal   INSULIN  LISPRO (HUMALOG  KWIKPEN) 100 UNIT/ML KWIKPEN    Inject 1-15 Units into the skin 3 (three) times daily.   INSULIN  PEN NEEDLE 31G X 5 MM MISC    1 each  by Does not apply route at bedtime.   INSULIN  PEN NEEDLE 32G X 4 MM MISC    1 Needle by Does not apply route 3 (three) times daily.   LANCET DEVICES (ACCU-CHEK SOFTCLIX) LANCETS    Use as instructed daily.   LANCETS (FREESTYLE) LANCETS    Use to check blood sugar three times daily. E11.49   LUPRON DEPOT, 81-MONTH, 11.25 MG INJECTION    Inject 11.25 mg into the muscle every 3 (three) months.   METFORMIN  (GLUCOPHAGE ) 500 MG TABLET    Take 2 tablets (1,000 mg total) by mouth 2 (two) times daily with a meal.   NICOTINE  (NICODERM CQ  - DOSED IN MG/24 HOURS) 21 MG/24HR PATCH    Place 1 patch (21 mg total) onto the skin daily.   NIFEDIPINE  (PROCARDIA -XL/NIFEDICAL-XL) 30 MG 24 HR TABLET    Take 1 tablet (30 mg total) by mouth daily.   NORETHINDRONE  (AYGESTIN ) 5 MG TABLET    TAKE 1 TABLET BY MOUTH THREE TIMES A DAY   OMEPRAZOLE  (PRILOSEC) 40 MG CAPSULE    TAKE 1 CAPSULE BY MOUTH EVERY  MORNING   PREGABALIN  (LYRICA ) 100 MG CAPSULE    Take 1 capsule (100 mg total) by mouth 2 (two) times daily.   SACCHAROMYCES BOULARDII (FLORASTOR) 250 MG CAPSULE    Take 1 capsule (250 mg total) by mouth 2 (two) times daily.   SPIRONOLACTONE  (ALDACTONE ) 50 MG TABLET    Take 1 tablet (50 mg total) by mouth daily.   VALACYCLOVIR  (VALTREX ) 500 MG TABLET    TAKE 1 TABLET (500 MG TOTAL) BY MOUTH 2 (TWO) TIMES DAILY. FOR HERPES PROPHYLAXIS  Modified Medications   No medications on file  Discontinued Medications   SEMAGLUTIDE , 1 MG/DOSE, 4 MG/3ML SOPN    Inject 1 mg as directed once a week.    Allergies Allergies  Allergen Reactions   Amlodipine      Leg pain, swelling around eyes    Atorvastatin  Other (See Comments)     Joint pain   Crestor  [Rosuvastatin ]     Joint pain    Irbesartan      Leg pain, swelling around eyes   Metronidazole  Nausea  And Vomiting   Penicillins     childhood   Xanax [Alprazolam]     Caused upper respiratory symptoms per pt   Glyburide Other (See Comments)    blood sugar dropped  uncontrollably   Linagliptin Diarrhea and Other (See Comments)    Stomach pain, sinus infection   Moxifloxacin Diarrhea    Past Medical History Past Medical History:  Diagnosis Date   Abnormal Pap smear of cervix    yrs ago   Adenomyosis    Allergy     Anemia    Anxiety    Aortic atherosclerosis (HCC)    Arthritis    Asthma    Chronic kidney disease    Chronic UTI    Clostridium difficile infection    Depression    Diabetes mellitus without complication (HCC) 10/22/2023   A1C 8.8   Diverticulosis    Endometriosis    Fibroid    Fibromyalgia    GERD (gastroesophageal reflux disease)    Hiatal hernia    Hyperlipidemia    Hypertension    Internal hemorrhoids    Meningitis, viral    Migraines    Pancreatitis    Pure hypercholesterolemia 12/23/2020   Tubular adenoma of colon     Past Surgical History Past Surgical History:  Procedure Laterality Date   APPENDECTOMY  1990   CERVICAL BIOPSY  W/ LOOP ELECTRODE EXCISION     LEEP   CESAREAN SECTION  2002   CHOLECYSTECTOMY  2011   COLONOSCOPY     UPPER GASTROINTESTINAL ENDOSCOPY     URINARY SURGERY     urethra sling, removal, and then revision 2016 (4 surgeries)   WISDOM TOOTH EXTRACTION      Family History family history includes Cancer in her mother; Diabetes in her father; Heart attack in her maternal grandfather; Hypertension in her mother; Multiple sclerosis in her paternal grandmother.  Social History Social History   Socioeconomic History   Marital status: Single    Spouse name: Not on file   Number of children: 3   Years of education: Not on file   Highest education level: Not on file  Occupational History   Not on file  Tobacco Use   Smoking status: Former    Current packs/day: 0.10    Average packs/day: 0.1 packs/day for 20.0 years (2.0 ttl pk-yrs)    Types: Cigarettes   Smokeless tobacco: Never   Tobacco comments:    Patient is engaged in health coaching for smoking cessation as of 12/26/20.  Patient stated that she has stopped smoking, drinking and any form of drug usage since her stroke.  Vaping Use   Vaping status: Never Used  Substance and Sexual Activity   Alcohol use: Not Currently    Comment: occ   Drug use: Not Currently    Types: Marijuana    Comment: occ   Sexual activity: Yes    Partners: Male    Birth control/protection: Surgical    Comment: BTL  Other Topics Concern   Not on file  Social History Narrative   Lives home with adult niece.  Not working.  Disability pending.  Pt is single.  Education GED.  3 children.    Social Drivers of Corporate investment banker Strain: Low Risk  (01/25/2024)   Overall Financial Resource Strain (CARDIA)    Difficulty of Paying Living Expenses: Not very hard  Food Insecurity: Food Insecurity Present (01/25/2024)   Hunger Vital Sign    Worried About Running Out of Food  in the Last Year: Sometimes true    Ran Out of Food in the Last Year: Sometimes true  Transportation Needs: No Transportation Needs (01/25/2024)   PRAPARE - Administrator, Civil Service (Medical): No    Lack of Transportation (Non-Medical): No  Physical Activity: Insufficiently Active (01/25/2024)   Exercise Vital Sign    Days of Exercise per Week: 3 days    Minutes of Exercise per Session: 30 min  Stress: Stress Concern Present (01/25/2024)   [employer] of Occupational Health - Occupational Stress Questionnaire    Feeling of Stress : Rather much  Social Connections: Socially Isolated (01/25/2024)   Social Connection and Isolation Panel    Frequency of Communication with Friends and Family: Three times a week    Frequency of Social Gatherings with Friends and Family: Once a week    Attends Religious Services: Never    Active Member of Clubs or Organizations: No    Attends Banker Meetings: Never    Marital Status: Never married  Intimate Partner Violence: Not At Risk (01/25/2024)   Humiliation, Afraid, Rape, and Kick questionnaire     Fear of Current or Ex-Partner: No    Emotionally Abused: No    Physically Abused: No    Sexually Abused: No    Lab Results  Component Value Date   HGBA1C 6.7 (A) 04/13/2024   HGBA1C 6.4 (H) 01/08/2024   HGBA1C 7.7 (H) 11/22/2023   Lab Results  Component Value Date   CHOL 251 (H) 05/25/2024   Lab Results  Component Value Date   HDL 51 05/25/2024   Lab Results  Component Value Date   LDLCALC 177 (H) 05/25/2024   Lab Results  Component Value Date   TRIG 130 05/25/2024   Lab Results  Component Value Date   CHOLHDL 4.9 (H) 05/25/2024   Lab Results  Component Value Date   CREATININE 1.17 (H) 05/05/2024   Lab Results  Component Value Date   GFR 106.09 02/20/2022   Lab Results  Component Value Date   MICROALBUR 1.3 11/22/2023      Component Value Date/Time   NA 140 05/05/2024 1456   K 5.0 05/05/2024 1456   CL 103 05/05/2024 1456   CO2 21 05/05/2024 1456   GLUCOSE 140 (H) 05/05/2024 1456   GLUCOSE 120 (H) 02/08/2024 1109   BUN 17 05/05/2024 1456   CREATININE 1.17 (H) 05/05/2024 1456   CREATININE 0.97 02/08/2024 1109   CREATININE 0.70 11/22/2023 0950   CALCIUM  9.3 05/05/2024 1456   PROT 7.7 02/08/2024 1109   PROT 6.6 01/20/2024 1630   ALBUMIN 4.3 02/08/2024 1109   ALBUMIN 4.1 01/20/2024 1630   AST 12 (L) 02/08/2024 1109   ALT 13 02/08/2024 1109   ALKPHOS 52 02/08/2024 1109   BILITOT 0.4 02/08/2024 1109   GFRNONAA >60 02/08/2024 1109   GFRNONAA >89 10/28/2016 0851   GFRAA 127 12/12/2020 1149   GFRAA >89 10/28/2016 0851      Latest Ref Rng & Units 05/05/2024    2:56 PM 03/28/2024    1:25 PM 03/08/2024    3:01 PM  BMP  Glucose 70 - 99 mg/dL 859  838  787   BUN 6 - 24 mg/dL 17  22  28    Creatinine 0.57 - 1.00 mg/dL 8.82  8.84  8.77   BUN/Creat Ratio 9 - 23 15  19  23    Sodium 134 - 144 mmol/L 140  134  138   Potassium 3.5 -  5.2 mmol/L 5.0  4.1  4.7   Chloride 96 - 106 mmol/L 103  98  99   CO2 20 - 29 mmol/L 21  21  21    Calcium  8.7 - 10.2 mg/dL  9.3  9.6  9.5        Component Value Date/Time   WBC 10.6 (H) 02/08/2024 1109   WBC 11.4 (H) 01/09/2024 0530   RBC 4.88 02/08/2024 1109   HGB 15.2 (H) 02/08/2024 1109   HGB 15.0 01/20/2024 1630   HCT 43.6 02/08/2024 1109   HCT 44.9 01/20/2024 1630   PLT 316 02/08/2024 1109   PLT 330 01/20/2024 1630   MCV 89.3 02/08/2024 1109   MCV 95 01/20/2024 1630   MCH 31.1 02/08/2024 1109   MCHC 34.9 02/08/2024 1109   RDW 13.7 02/08/2024 1109   RDW 12.7 01/20/2024 1630   LYMPHSABS 2.5 02/08/2024 1109   LYMPHSABS 2.5 01/20/2024 1630   MONOABS 0.6 02/08/2024 1109   EOSABS 0.1 02/08/2024 1109   EOSABS 0.1 01/20/2024 1630   BASOSABS 0.1 02/08/2024 1109   BASOSABS 0.1 01/20/2024 1630     Parts of this note may have been dictated using voice recognition software. There may be variances in spelling and vocabulary which are unintentional. Not all errors are proofread. Please notify the dino if any discrepancies are noted or if the meaning of any statement is not clear.

## 2024-07-03 ENCOUNTER — Encounter: Payer: Self-pay | Admitting: Family Medicine

## 2024-07-03 ENCOUNTER — Encounter: Payer: Self-pay | Admitting: "Endocrinology

## 2024-07-04 ENCOUNTER — Telehealth: Payer: Self-pay

## 2024-07-04 ENCOUNTER — Encounter: Payer: Self-pay | Admitting: Family Medicine

## 2024-07-04 NOTE — Telephone Encounter (Signed)
 I called the patient and spoke to her about CAP services.  She said she dropped the forms off to be completed /signed.  She did not drop off the release of information and said she will do that tomorrow. I told her that we will submit the entire application once Dr Newlin has signed off.  I then explained to her that the wait for CAP services can be a long one, possibly a year, and she said she understood.  I then told her about PCS program and explained that she could qualify for PCS while she is waiting for CAP services to become available.  [facility]S does not have a wait list; however, her daughter would not be paid to be her caregiver.  She thought about and then decided she would like us  to place a PCS referral.

## 2024-07-05 ENCOUNTER — Encounter: Payer: Self-pay | Admitting: "Endocrinology

## 2024-07-06 ENCOUNTER — Encounter: Payer: Self-pay | Admitting: Family Medicine

## 2024-07-06 ENCOUNTER — Telehealth (HOSPITAL_BASED_OUTPATIENT_CLINIC_OR_DEPARTMENT_OTHER): Admitting: Family Medicine

## 2024-07-06 DIAGNOSIS — I152 Hypertension secondary to endocrine disorders: Secondary | ICD-10-CM | POA: Diagnosis not present

## 2024-07-06 DIAGNOSIS — Z8673 Personal history of transient ischemic attack (TIA), and cerebral infarction without residual deficits: Secondary | ICD-10-CM

## 2024-07-06 DIAGNOSIS — Z79899 Other long term (current) drug therapy: Secondary | ICD-10-CM

## 2024-07-06 DIAGNOSIS — E1159 Type 2 diabetes mellitus with other circulatory complications: Secondary | ICD-10-CM | POA: Diagnosis not present

## 2024-07-06 DIAGNOSIS — Z794 Long term (current) use of insulin: Secondary | ICD-10-CM

## 2024-07-06 DIAGNOSIS — F17210 Nicotine dependence, cigarettes, uncomplicated: Secondary | ICD-10-CM | POA: Diagnosis not present

## 2024-07-06 DIAGNOSIS — M797 Fibromyalgia: Secondary | ICD-10-CM | POA: Diagnosis not present

## 2024-07-06 DIAGNOSIS — Z7984 Long term (current) use of oral hypoglycemic drugs: Secondary | ICD-10-CM | POA: Diagnosis not present

## 2024-07-06 DIAGNOSIS — I69351 Hemiplegia and hemiparesis following cerebral infarction affecting right dominant side: Secondary | ICD-10-CM | POA: Diagnosis not present

## 2024-07-06 NOTE — Patient Instructions (Signed)
 I have placed a referral to an external physical therapy center to evaluate you for aquatic therapy.  Just like we discussed on the phone and a community pool will also serve the same purpose and you can explore options such as the [facility] or other [facility] aquatic center as well.

## 2024-07-06 NOTE — Progress Notes (Signed)
 Virtual Visit via Video Note  I connected with Amber Rose, on 07/06/2024 at 11:55 AM by video enabled telemedicine device and verified that I am speaking with the correct person using two identifiers.   Consent: I discussed the limitations, risks, security and privacy concerns of performing an evaluation and management service by telemedicine and the availability of in person appointments. I also discussed with the patient that there may be a patient responsible charge related to this service. The patient expressed understanding and agreed to proceed.   Location of Patient: Home  Location of Provider: Clinic   Persons participating in Telemedicine visit: Arlyne Kluver Dr. Delbert    Discussed the use of AI scribe software for clinical note transcription with the patient, who gave verbal consent to proceed.  History of Present Illness Amber Rose is a 48 year old female with  a history of  type 2 diabetes mellitus , hypertension, depression and anxiety, hydradenitis, tobacco abuse, fibromyalgia, left thalamic infarct in 12/2023 who presents with concerns regarding her rehabilitation therapy and blood pressure management.  I had received a staff message from the physical therapist regarding her aquatic therapy stating she had met the physical therapy goals with recommendation to continue at a community pool.  Also cited was her uncontrolled blood pressure and persistent smoking which was not a good combination with the heated pool.  She had requested another aquatic therapy referral and this message was communicated to her.  Has concerns about changes in her therapist's behavior after expressing interest in aquatic therapy and mentioning blood pressure issues. Her therapist stopped monitoring her blood pressure during sessions per patient, which she found unusual. She has difficulty measuring her blood pressure at home and has been advised to discuss her consistently  high readings with her physician.  She smokes approximately four cigarettes daily, having reduced from a higher amount. Despite her stroke history, she continues to smoke occasionally. She believes aquatic therapy could benefit her due to her hypertension, diabetes, fibromyalgia, and muscle issues from statin use.        Past Medical History:  Diagnosis Date   Abnormal Pap smear of cervix    yrs ago   Adenomyosis    Allergy     Anemia    Anxiety    Aortic atherosclerosis (HCC)    Arthritis    Asthma    Chronic kidney disease    Chronic UTI    Clostridium difficile infection    Depression    Diabetes mellitus without complication (HCC) 10/22/2023   A1C 8.8   Diverticulosis    Endometriosis    Fibroid    Fibromyalgia    GERD (gastroesophageal reflux disease)    Hiatal hernia    Hyperlipidemia    Hypertension    Internal hemorrhoids    Meningitis, viral    Migraines    Pancreatitis    Pure hypercholesterolemia 12/23/2020   Tubular adenoma of colon    Allergies  Allergen Reactions   Amlodipine      Leg pain, swelling around eyes    Atorvastatin  Other (See Comments)     Joint pain   Crestor  [Rosuvastatin ]     Joint pain    Irbesartan      Leg pain, swelling around eyes   Metronidazole  Nausea And Vomiting   Penicillins     childhood   Xanax [Alprazolam]     Caused upper respiratory symptoms per pt   Glyburide Other (See Comments)    blood sugar dropped uncontrollably  Linagliptin Diarrhea and Other (See Comments)    Stomach pain, sinus infection   Moxifloxacin Diarrhea    Current Outpatient Medications on File Prior to Visit  Medication Sig Dispense Refill   albuterol  (PROAIR  HFA) 108 (90 Base) MCG/ACT inhaler Inhale 2 puffs into the lungs every 4 (four) hours as needed for wheezing or shortness of breath. 3 Inhaler 0   aspirin  EC 81 MG tablet Take 1 tablet (81 mg total) by mouth daily. Swallow whole. 30 tablet 12   baclofen  (LIORESAL ) 10 MG tablet Take  1 tablet (10 mg total) by mouth 3 (three) times daily as needed for muscle spasms. 90 each 11   Blood Glucose Monitoring Suppl (ACCU-CHEK AVIVA PLUS) w/Device KIT USE AS DIRECTED DAILY. E11.9 1 kit 0   buPROPion  (WELLBUTRIN  XL) 150 MG 24 hr tablet Take 1 tablet (150 mg total) by mouth daily. 90 tablet 1   Continuous Glucose Receiver (FREESTYLE LIBRE 3 READER) DEVI 1 Device by Does not apply route continuous. 1 each 0   Continuous Glucose Sensor (FREESTYLE LIBRE 3 PLUS SENSOR) MISC Inject 1 Device into the skin continuous. Change every 15 days 6 each 3   Glucagon  (BAQSIMI  ONE PACK) 3 MG/DOSE POWD Place 1 Device into the nose as needed (Low blood sugar with impaired consciousness). 2 each 3   Glucagon  (GVOKE HYPOPEN  1-PACK) 1 MG/0.2ML SOAJ Inject 1 mg into the skin as needed (low blood sugar with impaired consciousness). 0.4 mL 2   glucose blood (FREESTYLE TEST STRIPS) test strip USE TO CHECK BLOOD SUGAR THREE TIMES DAILY. E11.49 100 strip 6   hydrOXYzine  (ATARAX ) 50 MG tablet Take 1 tablet (50 mg total) by mouth 3 (three) times daily as needed. 30 tablet 5   indapamide  (LOZOL ) 1.25 MG tablet Take 1 tablet (1.25 mg total) by mouth daily. 90 tablet 3   insulin  glargine (LANTUS  SOLOSTAR) 100 UNIT/ML Solostar Pen Inject 25 Units into the skin daily. Increase by 2 units to a maximum daily dose of 30 units every 4th day until blood sugars are at goal 15 mL 6   insulin  lispro (HUMALOG  KWIKPEN) 100 UNIT/ML KwikPen Inject 1-15 Units into the skin 3 (three) times daily. 43 mL 1   Insulin  Pen Needle 31G X 5 MM MISC 1 each by Does not apply route at bedtime. 30 each 5   Insulin  Pen Needle 32G X 4 MM MISC 1 Needle by Does not apply route 3 (three) times daily. 200 each 5   Lancet Devices (ACCU-CHEK SOFTCLIX) lancets Use as instructed daily. 1 each 5   Lancets (FREESTYLE) lancets Use to check blood sugar three times daily. E11.49 100 each 3   LUPRON DEPOT, 90-MONTH, 11.25 MG injection Inject 11.25 mg into the muscle  every 3 (three) months.     metFORMIN  (GLUCOPHAGE ) 500 MG tablet Take 2 tablets (1,000 mg total) by mouth 2 (two) times daily with a meal. 120 tablet 4   nicotine  (NICODERM CQ  - DOSED IN MG/24 HOURS) 21 mg/24hr patch Place 1 patch (21 mg total) onto the skin daily. 28 patch 0   NIFEdipine  (PROCARDIA -XL/NIFEDICAL-XL) 30 MG 24 hr tablet Take 1 tablet (30 mg total) by mouth daily. (Patient not taking: Reported on 06/29/2024) 30 tablet 6   norethindrone  (AYGESTIN ) 5 MG tablet TAKE 1 TABLET BY MOUTH THREE TIMES A DAY (Patient not taking: Reported on 06/29/2024) 252 tablet 2   omeprazole  (PRILOSEC) 40 MG capsule TAKE 1 CAPSULE BY MOUTH EVERY  MORNING 100 capsule 0  pregabalin  (LYRICA ) 100 MG capsule Take 1 capsule (100 mg total) by mouth 2 (two) times daily. 60 capsule 5   saccharomyces boulardii (FLORASTOR) 250 MG capsule Take 1 capsule (250 mg total) by mouth 2 (two) times daily. 60 capsule 0   Semaglutide  (RYBELSUS ) 14 MG TABS Take 1 tablet (14 mg total) by mouth daily. 90 tablet 1   spironolactone  (ALDACTONE ) 50 MG tablet Take 1 tablet (50 mg total) by mouth daily. 90 tablet 3   valACYclovir  (VALTREX ) 500 MG tablet TAKE 1 TABLET (500 MG TOTAL) BY MOUTH 2 (TWO) TIMES DAILY. FOR HERPES PROPHYLAXIS 90 tablet 1   No current facility-administered medications on file prior to visit.    ROS: See HPI  Observations/Objective: Awake, alert, oriented x3 Not in acute distress Normal mood      Latest Ref Rng & Units 05/05/2024    2:56 PM 03/28/2024    1:25 PM 03/08/2024    3:01 PM  CMP  Glucose 70 - 99 mg/dL [phone]  838  [phone]   BUN 6 - 24 mg/dL 17  22  28    Creatinine 0.57 - 1.00 mg/dL 8.82  8.84  8.77   Sodium 134 - 144 mmol/L 140  134  138   Potassium 3.5 - 5.2 mmol/L 5.0  4.1  4.7   Chloride 96 - 106 mmol/L 103  98  99   CO2 20 - 29 mmol/L 21  21  21    Calcium  8.7 - 10.2 mg/dL 9.3  9.6  9.5     Lipid Panel     Component Value Date/Time   CHOL 251 (H) 05/25/2024 1035   TRIG 130 05/25/2024 1035    HDL 51 05/25/2024 1035   CHOLHDL 4.9 (H) 05/25/2024 1035   CHOLHDL 10.0 01/08/2024 0730   VLDL 33 01/08/2024 0730   LDLCALC 177 (H) 05/25/2024 1035   LDLCALC 184 (H) 11/22/2023 0950   LDLDIRECT 51 08/14/2022 1431   LABVLDL 23 05/25/2024 1035    Lab Results  Component Value Date   HGBA1C 6.7 (A) 04/13/2024     Assessment and plan:   Assessment & Plan 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 Physical therapy, including aquatic therapy, completed with goals met.  Physical therapy. - Recommendation was to continue with the community pool but the patient is unhappy about this as she would like to continue with prescribed aquatic therapy. - Coordinate with case manager Slater to find alternative aquatic therapy options. - I have placed a referral to an external physical therapy center to evaluate her for aquatic therapy if possible - Secondary risk factor modification including adequate blood pressure control, smoking cessation and lipid control is imperative.  She has not tolerated statin and declined PCSK9 inhibitors  Hypertension associated with type 2 diabetes mellitus Uncontrolled Blood pressure management under Dr. Raford.  Advised that optimal blood pressure control is necessary for physical therapy Unfortunately she has been intolerant of multiple antihypertensives and continues to smoke  Nicotine  dependence, cigarettes Chronic nicotine  dependence with current smoking of four cigarettes daily. Smoking cessation critical due to stroke and hypertension risk.  Fibromyalgia Chronic fibromyalgia with musculoskeletal pain. Aquatic therapy beneficial for pain management and mobility. - Explore alternative options for aquatic therapy. - Consult with case manager Slater to identify other centers for therapy.       No orders of the defined types were placed in this encounter.   Follow Up  Instructions: Keep previously scheduled appointment   I  discussed the assessment and treatment plan with the patient. The patient was provided an opportunity to ask questions and all were answered. The patient agreed with the plan and demonstrated an understanding of the instructions.   The patient was advised to call back or seek an in-person evaluation if the symptoms worsen or if the condition fails to improve as anticipated.     I provided 22 minutes total of Telehealth time during this encounter including median intraservice time, reviewing previous notes, investigations, ordering medications, medical decision making, coordinating care and patient verbalized understanding at the end of the visit.     Corrina Sabin, MD, FAAFP. [facility] [place], [place] [phone]   07/06/2024, 11:55 AM

## 2024-07-08 ENCOUNTER — Encounter: Payer: Self-pay | Admitting: *Deleted

## 2024-07-08 LAB — AMB RESULTS CONSOLE CBG: Glucose: 105

## 2024-07-10 ENCOUNTER — Encounter: Attending: "Endocrinology | Admitting: Dietician

## 2024-07-10 DIAGNOSIS — E119 Type 2 diabetes mellitus without complications: Secondary | ICD-10-CM | POA: Diagnosis not present

## 2024-07-10 NOTE — Patient Instructions (Addendum)
 Increase your carbohydrate intake at each meal! Start by having ~30-45 grams (2-3 servings) at each meal and look to see a rise in your blood sugar of no more than 40-60 points. Eat a small meal or snack every 3-5 hours.  Move your breakfast meal to within an hour of waking and be sure to have some carbs with it!  Keep up the great work being active!

## 2024-07-10 NOTE — Progress Notes (Unsigned)
 Diabetes Self-Management Education  Visit Type: Follow-up  Appt. Start Time: 1500 Appt. End Time: 1600  03/06/2024  [name], identified by name and date of birth, is a 48 y.o. female with a diagnosis of Diabetes: Type 2.   ASSESSMENT Pt reports bouts of frequent hypoglycemia since last visit, was taken off of Lantus  4 days ago. Pt reports a little more difficulty with glycemic control since then. Pt states they have been using Humalog  as a corrective dose for post prandial hyperglycemia following sliding scale guidelines, which has led to subsequent hypoglycemia. Pt reports treating lows with juice, bread or PB, and these items would spike glucose >200 mg/dL. Pt reports not having to take Humalog  very often, and was never taking 15 minutes prior to meals. Pt reports continuing to take metformin  BID, Ozempic  @1  mg weekly. Pt reports frustration over carbohydrate intake and uncertainty in glycemic response. Pt states they will cheat occasionally with foods they have been trying to avoid.  CGM Report (90 days): >240: 2% 181-240: 9% 70 - 180: 88% <70: 1% Daily patterns indicate good glucose control throughout the day, with some highs happening mostly in the afternoon and/or overnight (>200 mg/dL). Average glucose 131 mg/dL (90 day).   Pt reports frequent hypoglycemia, states that they do not feel symptomatic when it happens.  Pt reports being taken off of Ozempic  and switched to Rybelsus , pt reports feeling much better on Rybelsus  but is still getting low readings on CGM.  Magnesium, Vit D/K2 B12  Pt reports increasing exercise, has been doing more resistance exercise, water aerobic therapy.  CGM reading 54 mg/dL during visit, finger stick read 84 mg/dL, pt ate a peppermint out of precaution   CGM Results from download:   Average glucose:   100 mg/dL for 81 days  Glucose management indicator:   5.7 %  Time in range (70-180 mg/dL):   97 %   (Goal >29%)  Time High  (181-250 mg/dL):   1 %   (Goal < 74%)  Time Very High (>250 mg/dL):    0 %   (Goal < 5%)  Time Low (54-69 mg/dL):   2 %   (Goal <5%)  Time Very Low (<54 mg/dL):   0 %   (Goal <8%)     B -1 boiled egg, 1 whole avocado, 2 strawberries, mixed nuts L - Chicken, 2 slices of butter bread S- Nuts D- Yogurt and peaches S- Nuts Bev - Water, hot tea   s

## 2024-07-11 ENCOUNTER — Other Ambulatory Visit: Payer: Self-pay | Admitting: Family Medicine

## 2024-07-11 ENCOUNTER — Encounter: Payer: Self-pay | Admitting: Dietician

## 2024-07-11 DIAGNOSIS — N3281 Overactive bladder: Secondary | ICD-10-CM

## 2024-07-11 NOTE — Telephone Encounter (Signed)
 [facility] referral efaxed to [facility] : [phone]  [facility] referral efaxed to [facility]: [phone].

## 2024-07-13 DIAGNOSIS — M25551 Pain in right hip: Secondary | ICD-10-CM | POA: Diagnosis not present

## 2024-07-13 DIAGNOSIS — M545 Low back pain, unspecified: Secondary | ICD-10-CM | POA: Diagnosis not present

## 2024-07-13 DIAGNOSIS — R2689 Other abnormalities of gait and mobility: Secondary | ICD-10-CM | POA: Diagnosis not present

## 2024-07-13 DIAGNOSIS — M6281 Muscle weakness (generalized): Secondary | ICD-10-CM | POA: Diagnosis not present

## 2024-07-13 DIAGNOSIS — M79601 Pain in right arm: Secondary | ICD-10-CM | POA: Diagnosis not present

## 2024-07-14 ENCOUNTER — Encounter: Payer: Self-pay | Admitting: Adult Health

## 2024-07-15 ENCOUNTER — Other Ambulatory Visit: Payer: Self-pay | Admitting: "Endocrinology

## 2024-07-15 DIAGNOSIS — Z794 Long term (current) use of insulin: Secondary | ICD-10-CM

## 2024-07-17 NOTE — Telephone Encounter (Signed)
 Requested Prescriptions   Pending Prescriptions Disp Refills   metFORMIN  (GLUCOPHAGE ) 500 MG tablet [Pharmacy Med Name: METFORMIN  HCL 500 MG TABLET] 360 tablet 1    Sig: TAKE 2 TABLETS (1,000 MG TOTAL) BY MOUTH 2 (TWO) TIMES DAILY WITH A MEAL.

## 2024-07-18 DIAGNOSIS — R2689 Other abnormalities of gait and mobility: Secondary | ICD-10-CM | POA: Diagnosis not present

## 2024-07-18 DIAGNOSIS — M25551 Pain in right hip: Secondary | ICD-10-CM | POA: Diagnosis not present

## 2024-07-18 DIAGNOSIS — M545 Low back pain, unspecified: Secondary | ICD-10-CM | POA: Diagnosis not present

## 2024-07-18 DIAGNOSIS — M6281 Muscle weakness (generalized): Secondary | ICD-10-CM | POA: Diagnosis not present

## 2024-07-18 DIAGNOSIS — M79601 Pain in right arm: Secondary | ICD-10-CM | POA: Diagnosis not present

## 2024-07-25 DIAGNOSIS — M6281 Muscle weakness (generalized): Secondary | ICD-10-CM | POA: Diagnosis not present

## 2024-07-25 DIAGNOSIS — R2689 Other abnormalities of gait and mobility: Secondary | ICD-10-CM | POA: Diagnosis not present

## 2024-07-25 DIAGNOSIS — M25551 Pain in right hip: Secondary | ICD-10-CM | POA: Diagnosis not present

## 2024-07-25 DIAGNOSIS — M545 Low back pain, unspecified: Secondary | ICD-10-CM | POA: Diagnosis not present

## 2024-07-25 DIAGNOSIS — M79601 Pain in right arm: Secondary | ICD-10-CM | POA: Diagnosis not present

## 2024-07-26 ENCOUNTER — Encounter: Payer: Self-pay | Admitting: Physician Assistant

## 2024-07-26 ENCOUNTER — Other Ambulatory Visit: Payer: Self-pay

## 2024-07-26 ENCOUNTER — Other Ambulatory Visit (HOSPITAL_COMMUNITY): Payer: Self-pay

## 2024-07-26 ENCOUNTER — Emergency Department (HOSPITAL_COMMUNITY)

## 2024-07-26 ENCOUNTER — Ambulatory Visit (INDEPENDENT_AMBULATORY_CARE_PROVIDER_SITE_OTHER): Admitting: Physician Assistant

## 2024-07-26 ENCOUNTER — Emergency Department (HOSPITAL_COMMUNITY)
Admission: EM | Admit: 2024-07-26 | Discharge: 2024-07-26 | Disposition: A | Discharge status: Home / Self Care | Attending: Emergency Medicine | Admitting: Emergency Medicine

## 2024-07-26 ENCOUNTER — Encounter (HOSPITAL_COMMUNITY): Payer: Self-pay

## 2024-07-26 ENCOUNTER — Other Ambulatory Visit (INDEPENDENT_AMBULATORY_CARE_PROVIDER_SITE_OTHER)

## 2024-07-26 VITALS — BP 148/106 | Ht 63.0 in | Wt 149.2 lb

## 2024-07-26 DIAGNOSIS — K625 Hemorrhage of anus and rectum: Secondary | ICD-10-CM

## 2024-07-26 DIAGNOSIS — R051 Acute cough: Secondary | ICD-10-CM | POA: Diagnosis not present

## 2024-07-26 DIAGNOSIS — R519 Headache, unspecified: Secondary | ICD-10-CM | POA: Insufficient documentation

## 2024-07-26 DIAGNOSIS — Z8673 Personal history of transient ischemic attack (TIA), and cerebral infarction without residual deficits: Secondary | ICD-10-CM | POA: Diagnosis not present

## 2024-07-26 DIAGNOSIS — I2510 Atherosclerotic heart disease of native coronary artery without angina pectoris: Secondary | ICD-10-CM | POA: Diagnosis not present

## 2024-07-26 DIAGNOSIS — E1122 Type 2 diabetes mellitus with diabetic chronic kidney disease: Secondary | ICD-10-CM | POA: Insufficient documentation

## 2024-07-26 DIAGNOSIS — R634 Abnormal weight loss: Secondary | ICD-10-CM

## 2024-07-26 DIAGNOSIS — R197 Diarrhea, unspecified: Secondary | ICD-10-CM

## 2024-07-26 DIAGNOSIS — R110 Nausea: Secondary | ICD-10-CM

## 2024-07-26 DIAGNOSIS — Z7984 Long term (current) use of oral hypoglycemic drugs: Secondary | ICD-10-CM | POA: Insufficient documentation

## 2024-07-26 DIAGNOSIS — R059 Cough, unspecified: Secondary | ICD-10-CM | POA: Diagnosis not present

## 2024-07-26 DIAGNOSIS — E1165 Type 2 diabetes mellitus with hyperglycemia: Secondary | ICD-10-CM | POA: Insufficient documentation

## 2024-07-26 DIAGNOSIS — K649 Unspecified hemorrhoids: Secondary | ICD-10-CM

## 2024-07-26 DIAGNOSIS — Z9049 Acquired absence of other specified parts of digestive tract: Secondary | ICD-10-CM | POA: Diagnosis not present

## 2024-07-26 DIAGNOSIS — K5901 Slow transit constipation: Secondary | ICD-10-CM

## 2024-07-26 DIAGNOSIS — R1084 Generalized abdominal pain: Secondary | ICD-10-CM

## 2024-07-26 DIAGNOSIS — Z7982 Long term (current) use of aspirin: Secondary | ICD-10-CM | POA: Insufficient documentation

## 2024-07-26 DIAGNOSIS — R109 Unspecified abdominal pain: Secondary | ICD-10-CM

## 2024-07-26 DIAGNOSIS — R030 Elevated blood-pressure reading, without diagnosis of hypertension: Secondary | ICD-10-CM

## 2024-07-26 DIAGNOSIS — N189 Chronic kidney disease, unspecified: Secondary | ICD-10-CM | POA: Diagnosis not present

## 2024-07-26 DIAGNOSIS — Z860101 Personal history of adenomatous and serrated colon polyps: Secondary | ICD-10-CM | POA: Diagnosis not present

## 2024-07-26 DIAGNOSIS — D72829 Elevated white blood cell count, unspecified: Secondary | ICD-10-CM | POA: Diagnosis not present

## 2024-07-26 DIAGNOSIS — Z794 Long term (current) use of insulin: Secondary | ICD-10-CM | POA: Insufficient documentation

## 2024-07-26 DIAGNOSIS — K5900 Constipation, unspecified: Secondary | ICD-10-CM | POA: Diagnosis not present

## 2024-07-26 DIAGNOSIS — R1032 Left lower quadrant pain: Secondary | ICD-10-CM | POA: Diagnosis not present

## 2024-07-26 DIAGNOSIS — I10 Essential (primary) hypertension: Secondary | ICD-10-CM | POA: Diagnosis not present

## 2024-07-26 DIAGNOSIS — I129 Hypertensive chronic kidney disease with stage 1 through stage 4 chronic kidney disease, or unspecified chronic kidney disease: Secondary | ICD-10-CM | POA: Insufficient documentation

## 2024-07-26 LAB — COMPREHENSIVE METABOLIC PANEL WITH GFR
ALT: 17 U/L (ref 0–44)
AST: 20 U/L (ref 15–41)
Albumin: 4.1 g/dL (ref 3.5–5.0)
Alkaline Phosphatase: 79 U/L (ref 38–126)
Anion gap: 13 (ref 5–15)
BUN: 18 mg/dL (ref 6–20)
CO2: 22 mmol/L (ref 22–32)
Calcium: 9.6 mg/dL (ref 8.9–10.3)
Chloride: 102 mmol/L (ref 98–111)
Creatinine, Ser: 0.86 mg/dL (ref 0.44–1.00)
GFR, Estimated: >60 mL/min (ref >60)
Glucose, Bld: 131 mg/dL — ABNORMAL HIGH (ref 70–99)
Potassium: 4 mmol/L (ref 3.5–5.1)
Sodium: 137 mmol/L (ref 135–145)
Total Bilirubin: 0.4 mg/dL (ref 0.0–1.2)
Total Protein: 6.9 g/dL (ref 6.5–8.1)

## 2024-07-26 LAB — CBC WITH DIFFERENTIAL/PLATELET
Abs Immature Granulocytes: 0.03 K/uL (ref 0.00–0.07)
Basophils Absolute: 0.1 K/uL (ref 0.0–0.1)
Basophils Relative: 0 %
Eosinophils Absolute: 0.1 K/uL (ref 0.0–0.5)
Eosinophils Relative: 1 %
HCT: 41.5 % (ref 36.0–46.0)
Hemoglobin: 13.8 g/dL (ref 12.0–15.0)
Immature Granulocytes: 0 %
Lymphocytes Relative: 26 %
Lymphs Abs: 2.9 K/uL (ref 0.7–4.0)
MCH: 31.6 pg (ref 26.0–34.0)
MCHC: 33.3 g/dL (ref 30.0–36.0)
MCV: 95 fL (ref 80.0–100.0)
Monocytes Absolute: 0.6 K/uL (ref 0.1–1.0)
Monocytes Relative: 5 %
Neutro Abs: 7.6 K/uL (ref 1.7–7.7)
Neutrophils Relative %: 68 %
Platelets: 354 K/uL (ref 150–400)
RBC: 4.37 MIL/uL (ref 3.87–5.11)
RDW: 13.2 % (ref 11.5–15.5)
WBC: 11.3 K/uL — ABNORMAL HIGH (ref 4.0–10.5)
nRBC: 0 % (ref 0.0–0.2)

## 2024-07-26 LAB — TROPONIN T, HIGH SENSITIVITY: Troponin T High Sensitivity: <15 ng/L (ref 0–19)

## 2024-07-26 LAB — HCG, SERUM, QUALITATIVE: Preg, Serum: NEGATIVE

## 2024-07-26 MED ORDER — HYDROCHLOROTHIAZIDE 25 MG PO TABS
25.0000 mg | ORAL_TABLET | Freq: Every day | ORAL | 0 refills | Status: DC
  Filled 2024-07-26: qty 14, 14d supply, fill #0

## 2024-07-26 MED ORDER — HYDROCORTISONE (PERIANAL) 2.5 % EX CREA: 1.0000 | TOPICAL_CREAM | Freq: Two times a day (BID) | CUTANEOUS | 1 refills | Status: AC

## 2024-07-26 MED ORDER — IOHEXOL 350 MG/ML SOLN
100.0000 mL | Freq: Once | INTRAVENOUS | Status: AC | PRN
  Administered 2024-07-26: 100 mL via INTRAVENOUS

## 2024-07-26 NOTE — Patient Instructions (Addendum)
 Your provider has requested that you go to the basement level for lab work before leaving today. Press B on the elevator. The lab is located at the first door on the left as you exit the elevator.  We have sent the following medications to your pharmacy for you to pick up at your convenience: Hydrocortisone Cream 2.5% twice rectally at bedtime   Please follow up with your Primary care Provider  Please follow up sooner if symptoms increase or worsen __________________________________________________________________________  Due to recent changes in healthcare laws, you may see the results of your imaging and laboratory studies on MyChart before your provider has had a chance to review them.  We understand that in some cases there may be results that are confusing or concerning to you. Not all laboratory results come back in the same time frame and the provider may be waiting for multiple results in order to interpret others.  Please give us  48 hours in order for your provider to thoroughly review all the results before contacting the office for clarification of your results.   Thank you for trusting me with your gastrointestinal care!   Ellouise Console, PA-C _______________________________________________________  If your blood pressure at your visit was 140/90 or greater, please contact your primary care physician to follow up on this.  _______________________________________________________  If you are age 19 or older, your body mass index should be between 23-30. Your Body mass index is 26.44 kg/m. If this is out of the aforementioned range listed, please consider follow up with your Primary Care Provider.  If you are age 1 or younger, your body mass index should be between 19-25. Your Body mass index is 26.44 kg/m. If this is out of the aformentioned range listed, please consider follow up with your Primary Care Provider.   ________________________________________________________  The  [facility] providers would like to encourage you to use MYCHART to communicate with providers for non-urgent requests or questions.  Due to long hold times on the telephone, sending your provider a message by [facility] may be a faster and more efficient way to get a response.  Please allow 48 business hours for a response.  Please remember that this is for non-urgent requests.  _______________________________________________________

## 2024-07-26 NOTE — ED Provider Notes (Signed)
 [place] EMERGENCY DEPARTMENT AT [facility] Provider Note   CSN: [id number] Arrival date & time: 07/26/24  1230     Patient presents with: Hypertension   Amber Lepkowski is a 48 y.o. female.    Hypertension     48 year old female with medical history significant for DM2, HTN, depression, anxiety, fibromyalgia, viral meningitis, pancreatitis, GERD, HLD, diverticulosis, C. difficile infection and colonization, CVA with residual right hemibody numbness, CKD presenting to the emergency department with multiple complaints, primarily high blood pressure at her gastroenterologist office.  The patient states that she had an episode of what sounds to be gastroenteritis all last week with nausea, vomiting, diarrhea and crampy abdominal discomfort.  She also was endorsing headache as well, denied any sudden onset maximal onset headache.  She stopped taking her home spironolactone  because she thought that could possibly be contributing to her symptoms.  Her GI symptoms have been improving.  She had followed up with her gastroenterologist in the office today and it was noted that her blood pressure was over 180 systolic and her diastolic blood pressure was elevated.  She was sent to the emergency department for further evaluation due to concern for uncontrolled hypertension.  The patient has had cramping in her right calf, described as a charley horse like sensation.  She denies any history of DVT or PE.  She is not on anticoagulation.  She also has had a dry cough for the past week.  No fevers.  She still endorses some headache, mild neck discomfort, no neck rigidity.  Her abdominal bloating, swelling, nausea, diarrhea, abdominal pain has been improving over the past 2 days. Headache has been improving also, more mild today.   On chart review, she has a history of C. difficile treated with vancomycin , subsequently treated with Dificid  and thought to to be likely colonized per ID notes [facility]  2024.    Denies any new strokelike symptoms, no difficulty swallowing, had blurred vision with headache, no double vision, no new facial numbness or weakness, difficulty swallowing or speaking, focal numbness or weakness that is new compared to her baseline right hemibody numbness.  Prior to Admission medications   Medication Sig Start Date End Date Taking? Authorizing Provider  albuterol  (PROAIR  HFA) 108 (90 Base) MCG/ACT inhaler Inhale 2 puffs into the lungs every 4 (four) hours as needed for wheezing or shortness of breath. 04/05/18   Newlin, Enobong, MD  aspirin  EC 81 MG tablet Take 1 tablet (81 mg total) by mouth daily. Swallow whole. 01/10/24   Amin, Sumayya, MD  baclofen  (LIORESAL ) 10 MG tablet Take 1 tablet (10 mg total) by mouth 3 (three) times daily as needed for muscle spasms. 02/29/24   Whitfield Raisin, NP  Blood Glucose Monitoring Suppl (ACCU-CHEK AVIVA PLUS) w/Device KIT USE AS DIRECTED DAILY. E11.9 09/13/17   Newlin, Enobong, MD  buPROPion  (WELLBUTRIN  XL) 150 MG 24 hr tablet Take 1 tablet (150 mg total) by mouth daily. 01/06/24   Newlin, Enobong, MD  Continuous Glucose Receiver (FREESTYLE LIBRE 3 READER) DEVI 1 Device by Does not apply route continuous. 05/29/24   Motwani, Komal, MD  Continuous Glucose Sensor (FREESTYLE LIBRE 3 PLUS SENSOR) MISC Inject 1 Device into the skin continuous. Change every 15 days 03/15/24   Dartha Ernst, MD  Glucagon  (BAQSIMI  ONE PACK) 3 MG/DOSE POWD Place 1 Device into the nose as needed (Low blood sugar with impaired consciousness). 03/02/24   Motwani, Komal, MD  Glucagon  (GVOKE HYPOPEN  1-PACK) 1 MG/0.2ML SOAJ Inject 1 mg  into the skin as needed (low blood sugar with impaired consciousness). 03/02/24   Motwani, Komal, MD  glucose blood (FREESTYLE TEST STRIPS) test strip USE TO CHECK BLOOD SUGAR THREE TIMES DAILY. E11.49 03/14/24   Newlin, Enobong, MD  hydrocortisone (ANUSOL-HC) 2.5 % rectal cream Place 1 Application rectally 2 (two) times daily. 07/26/24   [place], PA-C  hydrOXYzine  (ATARAX ) 50 MG tablet Take 1 tablet (50 mg total) by mouth 3 (three) times daily as needed. 09/21/23   Iva Marty Saltness, MD  indapamide  (LOZOL ) 1.25 MG tablet Take 1 tablet (1.25 mg total) by mouth daily. Patient not taking: Reported on 07/26/2024 03/16/24   Raford Riggs, MD  insulin  glargine (LANTUS  SOLOSTAR) 100 UNIT/ML Solostar Pen Inject 25 Units into the skin daily. Increase by 2 units to a maximum daily dose of 30 units every 4th day until blood sugars are at goal Patient not taking: Reported on 07/26/2024 09/20/23   Newlin, Enobong, MD  insulin  lispro (HUMALOG  KWIKPEN) 100 UNIT/ML KwikPen Inject 1-15 Units into the skin 3 (three) times daily. 01/20/24 07/26/24  Motwani, Komal, MD  Insulin  Pen Needle 31G X 5 MM MISC 1 each by Does not apply route at bedtime. 08/25/23   Newlin, Enobong, MD  Insulin  Pen Needle 32G X 4 MM MISC 1 Needle by Does not apply route 3 (three) times daily. 01/21/24   Dartha Ernst, MD  Lancet Devices [facility]) lancets Use as instructed daily. 05/14/17   Delbert Clam, MD  Lancets (FREESTYLE) lancets Use to check blood sugar three times daily. E11.49 02/16/23   Newlin, Enobong, MD  LUPRON DEPOT, 78-MONTH, 11.25 MG injection Inject 11.25 mg into the muscle every 3 (three) months. Patient not taking: Reported on 07/26/2024 12/23/23   [provider]  metFORMIN  (GLUCOPHAGE ) 500 MG tablet TAKE 2 TABLETS (1,000 MG TOTAL) BY MOUTH 2 (TWO) TIMES DAILY WITH A MEAL. 07/17/24   Motwani, Ernst, MD  nicotine  (NICODERM CQ  - DOSED IN MG/24 HOURS) 21 mg/24hr patch Place 1 patch (21 mg total) onto the skin daily. 01/10/24   Amin, Sumayya, MD  NIFEdipine  (PROCARDIA -XL/NIFEDICAL-XL) 30 MG 24 hr tablet Take 1 tablet (30 mg total) by mouth daily. 05/05/24   Raford Riggs, MD  norethindrone  (AYGESTIN ) 5 MG tablet TAKE 1 TABLET BY MOUTH THREE TIMES A DAY 12/20/23   Cleotilde Ronal RAMAN, MD  omeprazole  (PRILOSEC) 40 MG capsule TAKE 1 CAPSULE BY MOUTH  EVERY  MORNING 08/09/23   Newlin, Enobong, MD  pregabalin  (LYRICA ) 100 MG capsule Take 1 capsule (100 mg total) by mouth 2 (two) times daily. 01/06/24   Newlin, Enobong, MD  saccharomyces boulardii (FLORASTOR) 250 MG capsule Take 1 capsule (250 mg total) by mouth 2 (two) times daily. 05/12/22   Beather Delon Gibson, PA  Semaglutide  (RYBELSUS ) 14 MG TABS Take 1 tablet (14 mg total) by mouth daily. Patient not taking: Reported on 07/26/2024 06/29/24   Motwani, Komal, MD  spironolactone  (ALDACTONE ) 50 MG tablet Take 1 tablet (50 mg total) by mouth daily. Patient not taking: Reported on 07/26/2024 09/16/23   Raford Riggs, MD  valACYclovir  (VALTREX ) 500 MG tablet TAKE 1 TABLET (500 MG TOTAL) BY MOUTH 2 (TWO) TIMES DAILY. FOR HERPES PROPHYLAXIS 07/11/24   Newlin, Enobong, MD    Allergies: Amlodipine , Atorvastatin , Crestor  [rosuvastatin ], Interferon beta-1a, Irbesartan , Metronidazole , Penicillins, Xanax [alprazolam], Glyburide, Linagliptin, and Moxifloxacin    Review of Systems  All other systems reviewed and are negative.   Updated Vital Signs BP (!) 180/107   Pulse 64  Temp 98.2 F (36.8 C) (Oral)   Resp (!) 21   LMP  (Within Weeks)   SpO2 98%   Physical Exam Vitals and nursing note reviewed.  Constitutional:      General: She is not in acute distress.    Appearance: She is well-developed.  HENT:     Head: Normocephalic and atraumatic.  Eyes:     Conjunctiva/sclera: Conjunctivae normal.  Neck:     Comments: No meningismus, ROM intact actively Cardiovascular:     Rate and Rhythm: Normal rate and regular rhythm.     Heart sounds: No murmur heard. Pulmonary:     Effort: Pulmonary effort is normal. No respiratory distress.     Breath sounds: Normal breath sounds.  Abdominal:     Palpations: Abdomen is soft.     Tenderness: There is abdominal tenderness in the left lower quadrant. There is no guarding.  Musculoskeletal:        General: No swelling.     Cervical back: Neck  supple.  Skin:    General: Skin is warm and dry.     Capillary Refill: Capillary refill takes less than 2 seconds.  Neurological:     Mental Status: She is alert.     Comments: MENTAL STATUS EXAM:    Orientation: Alert and oriented to person, place and time.  Memory: Cooperative, follows commands well.  Language: Speech is clear and language is normal.   CRANIAL NERVES:    CN 2 (Optic): Visual fields intact to confrontation.  CN 3,4,6 (EOM): Pupils equal and reactive to light. Full extraocular eye movement without nystagmus.  CN 5 (Trigeminal): Facial sensation is normal, no weakness of masticatory muscles.  CN 7 (Facial): No facial weakness or asymmetry.  CN 8 (Auditory): Auditory acuity grossly normal.  CN 9,10 (Glossophar): The uvula is midline, the palate elevates symmetrically.  CN 11 (spinal access): Normal sternocleidomastoid and trapezius strength.  CN 12 (Hypoglossal): The tongue is midline. No atrophy or fasciculations.SABRA   MOTOR:  Muscle Strength: 5/5RUE, 5/5LUE, 5/5RLE, 5/5LLE.   COORDINATION:   No tremor.   SENSATION:   At baseline with persistent right hemibody numbness.  GAIT: Gait not assessed   Psychiatric:        Mood and Affect: Mood normal.     (all labs ordered are listed, but only abnormal results are displayed) Labs Reviewed  CBC WITH DIFFERENTIAL/PLATELET - Abnormal; Notable for the following components:      Result Value   WBC 11.3 (*)    All other components within normal limits  COMPREHENSIVE METABOLIC PANEL WITH GFR - Abnormal; Notable for the following components:   Glucose, Bld 131 (*)    All other components within normal limits  RESP PANEL BY RT-PCR (RSV, FLU A&B, COVID)  [id number]  HCG, SERUM, QUALITATIVE  TROPONIN T, HIGH SENSITIVITY    EKG: EKG Interpretation Date/Time:  Wednesday July 26 2024 14:12:44 EDT Ventricular Rate:  72 PR Interval:  172 QRS Duration:  84 QT Interval:  384 QTC Calculation: 421 R Axis:   74  Text  Interpretation: Sinus rhythm Confirmed by Jerrol Agent ([id number]) on 07/26/2024 2:25:15 PM  Radiology: DG Chest Portable 1 View Result Date: 07/26/2024 CLINICAL DATA:  htn. EXAM: PORTABLE CHEST 1 VIEW COMPARISON:  02/24/2017. FINDINGS: Bilateral lung fields are clear. Bilateral costophrenic angles are clear. Normal cardio-mediastinal silhouette. No acute osseous abnormalities. The soft tissues are within normal limits. IMPRESSION: No active disease. Electronically Signed   By: Ree Kimberlee HERO.D.  On: 07/26/2024 14:41     Procedures   Medications Ordered in the ED  iohexol  (OMNIPAQUE ) 350 MG/ML injection 100 mL (has no administration in time range)                                    Medical Decision Making Amount and/or Complexity of Data Reviewed Labs: ordered. Radiology: ordered.  Risk Prescription drug management.    48 year old female with medical history significant for DM2, HTN, depression, anxiety, fibromyalgia, viral meningitis, pancreatitis, GERD, HLD, diverticulosis, C. difficile infection and colonization, CVA with residual right hemibody numbness, CKD presenting to the emergency department with multiple complaints, primarily high blood pressure at her gastroenterologist office.  The patient states that she had an episode of what sounds to be gastroenteritis all last week with nausea, vomiting, diarrhea and crampy abdominal discomfort.  She also was endorsing headache as well, denied any sudden onset maximal onset headache.  She stopped taking her home spironolactone  because she thought that could possibly be contributing to her symptoms.  Her GI symptoms have been improving.  She had followed up with her gastroenterologist in the office today and it was noted that her blood pressure was over 180 systolic and her diastolic blood pressure was elevated.  She was sent to the emergency department for further evaluation due to concern for uncontrolled hypertension.  The patient has had  cramping in her right calf, described as a charley horse like sensation.  She denies any history of DVT or PE.  She is not on anticoagulation.  She also has had a dry cough for the past week.  No fevers.  She still endorses some headache, mild neck discomfort, no neck rigidity.  Her abdominal bloating, swelling, nausea, diarrhea, abdominal pain has been improving over the past 2 days. Headache has been improving also, more mild today.   On chart review, she has a history of C. difficile treated with vancomycin , subsequently treated with Dificid  and thought to to be likely colonized per ID notes [facility] 2024.    Denies any new strokelike symptoms, no difficulty swallowing, had blurred vision with headache, no double vision, no new facial numbness or weakness, difficulty swallowing or speaking, focal numbness or weakness that is new compared to her baseline right hemibody numbness.  On arrival, the patient was afebrile, not tachycardic, hypertensive BP 175/117, tachypneic RR 18, saturating 96% on room air.  Neurologic exam at baseline, patient on exam had left lower quadrant abdominal tenderness to palpation.  She has no clear swelling of the lower extremity but endorses crampy discomfort in the right calf.  She endorses a dry cough.  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  Low concern for acute CVA.  Without thunderclap headache last week, low concern for [facility], low concern for meningitis with duration of symptoms and range of motion of the neck intact, patient not encephalopathic or confused, overall well-appearing.  Sent to the emerged apartment for further evaluation of blood pressure but presents with additional complaints based on her overall presentation for the past week.  Initial EKG: Sinus  rhythm, ventricular rate 72, no acute ischemic changes.  Initial chest x-ray no active disease  Labs: CBC with a nonspecific leukocytosis to 11.3, hemoglobin 13.8, CMP generally unremarkable, mild hyperglycemia to 131,  hCG negative, cardiac troponin less than 15.  COVID flu and RSV PCR testing was ordered and pending.  Setting the patient's multiple complaints and exam findings, we will obtain ultrasound of the right lower extremity to evaluate for DVT, CTA PE study to evaluate for PE given crampy leg pain and dry cough.  With left lower quadrant tenderness on exam, will evaluate for diverticulitis versus other colitis with CT of the abdomen pelvis.  Diarrheal illness appears to be resolving.  Patient is tolerating p.o. intake.  Signout given to Dr. Ginger to follow-up results of CT imaging, ultimate disposition pending results of diagnostic testing and reassessment.  Signout given at 1530.  Final diagnoses:  None    ED Discharge Orders     None          Jerrol Agent, MD 07/26/24 1534

## 2024-07-26 NOTE — ED Provider Notes (Signed)
 Care assumed from Dr.Lawsing.  At time of transfer of care, patient is waiting for results of CT scans to look for concerning etiology of her chest symptoms and abdominal discomfort.  Plan of care per previous team was if CT imaging is reassuring, plan of care to discharge home and have her restart her home medications and follow-up with her PCP team.  4:58 PM CT scans returned without evidence of acute surgical problem in the abdomen pelvis or evidence of pulm embolism.  There was a right sided ovarian cyst but the patient's pain was on the left side.  Do not suspect torsion at this time based on the description of symptoms and prior team's exam.  Patient requested some blood pressure medicine but did not want to take the spironolactone  she is on before.  She does state that she does tolerate HCTZ well in the past so we will do a dose of HCTZ daily until she sees her PCP in the next week.  She agrees with this plan and will maintain hydration.  She had no other questions or concerns and understood return precautions.  Patient discharged in good condition.   Clinical Impression: 1. Elevated blood pressure reading   2. Acute cough   3. Abdominal pain, unspecified abdominal location     Disposition: Discharge  Condition: Good  I have discussed the results, Dx and Tx plan with the pt(& family if present). He/she/they expressed understanding and agree(s) with the plan. Discharge instructions discussed at great length. Strict return precautions discussed and pt &/or family have verbalized understanding of the instructions. No further questions at time of discharge.    New Prescriptions   HYDROCHLOROTHIAZIDE  (HYDRODIURIL ) 25 MG TABLET    Take 1 tablet (25 mg total) by mouth daily for 14 days.    Follow Up: Delbert Clam, MD [address] [place] [address] [phone]        Maymie Brunke, Lonni PARAS, MD 07/26/24 [phone]

## 2024-07-26 NOTE — ED Triage Notes (Signed)
 Pt was sent over from [facility] due to her blood pressure being high. Pt states she recently stopped taking her blood pressure meds due to the side effects of vomiting, diarrhea, head pressure and leg pain. Since stopping the meds she has only had some head pressure and neck pain.

## 2024-07-26 NOTE — Progress Notes (Signed)
 Amber Console, PA-C [address] [place], [place]  [address] Phone: [phone]   Primary Care Physician: Delbert Clam, MD  Primary Gastroenterologist:  Amber Console, PA-C / Dr. Gordy Starch   Chief Complaint: Follow-up diarrhea and rectal pain       HPI:   Amber Rose is a 48 y.o. female, established patient of Dr. Starch, presents for follow-up of diarrhea and rectal pain.  Current symptoms: Patient has a multitude of chronic intermittent GI symptoms.  Her GI symptoms worsened when she was on Ozempic  and spironolactone .  She recently stopped Ozempic  and spironolactone  and her GI symptoms have improved.  GI symptoms included generalized abdominal pain, gas, abdominal bloating, swelling, nausea, diarrhea, constipation, hemorrhoids, and rectal bleeding.  Her weight is down 20 pounds in the past 7 months.  Typically has 1 or 2 loose stools per day.  She denies watery diarrhea or nocturnal diarrhea.  Patient had a stroke March 2025.  She was started on a lot of new medications after that including 81 mg aspirin  daily.  Her blood pressure is very elevated today.  She has been unable to tolerate multiple blood pressure medications.  She has follow-up with cardiology and hypertension specialist in the next week.  Patient last saw Con Blower in our office 07/2023.  She has history of probable gastroparesis, GERD, uncontrolled diabetes, esophageal dysphagia responsive to prior dilation, C. difficile colitis, hypertension, hyperlipidemia.  Current tobacco and marijuana use.  History of C. Difficile treated with vancomycin .  PCR toxin positivity 02/24/2022 with fecal leukocyte negative and fecal elastase normal.  C. difficile negative 02/2022, toxin positive 05/13/2022, toxin negative 06/11/2022, toxin positive 07/07/2022.  Patient was given Dificid  therapy September 2024 and then seen by ID October 2024 and was likely colonized.   08/2023 alpha gal negative.  ANA negative.  Fecal  pancreatic elastase greater than 800, normal.  07/2019 colonoscopy (for change in bowel habits): 1 small 4 mm tubular adenoma polyp removed.  Otherwise normal.  7-year repeat (due 07/2026).  07/2019 EGD (for epigastric pain, dysphagia, nausea): Duodenitis, otherwise normal.  Stomach and esophagus normal.  Biopsies negative for H. pylori.  02/2022 CT abdomen pelvis with contrast (for LLQ pain): Moderate stool throughout the colon.  Constipation.  S/p cholecystectomy.  Otherwise normal.  Current Outpatient Medications  Medication Sig Dispense Refill   albuterol  (PROAIR  HFA) 108 (90 Base) MCG/ACT inhaler Inhale 2 puffs into the lungs every 4 (four) hours as needed for wheezing or shortness of breath. 3 Inhaler 0   aspirin  EC 81 MG tablet Take 1 tablet (81 mg total) by mouth daily. Swallow whole. 30 tablet 12   baclofen  (LIORESAL ) 10 MG tablet Take 1 tablet (10 mg total) by mouth 3 (three) times daily as needed for muscle spasms. 90 each 11   Blood Glucose Monitoring Suppl (ACCU-CHEK AVIVA PLUS) w/Device KIT USE AS DIRECTED DAILY. E11.9 1 kit 0   buPROPion  (WELLBUTRIN  XL) 150 MG 24 hr tablet Take 1 tablet (150 mg total) by mouth daily. 90 tablet 1   Continuous Glucose Receiver (FREESTYLE LIBRE 3 READER) DEVI 1 Device by Does not apply route continuous. 1 each 0   Continuous Glucose Sensor (FREESTYLE LIBRE 3 PLUS SENSOR) MISC Inject 1 Device into the skin continuous. Change every 15 days 6 each 3   Glucagon  (BAQSIMI  ONE PACK) 3 MG/DOSE POWD Place 1 Device into the nose as needed (Low blood sugar with impaired consciousness). 2 each 3   Glucagon  (GVOKE HYPOPEN   1-PACK) 1 MG/0.2ML SOAJ Inject 1 mg into the skin as needed (low blood sugar with impaired consciousness). 0.4 mL 2   glucose blood (FREESTYLE TEST STRIPS) test strip USE TO CHECK BLOOD SUGAR THREE TIMES DAILY. E11.49 100 strip 6   hydrocortisone (ANUSOL-HC) 2.5 % rectal cream Place 1 Application rectally 2 (two) times daily. 30 g 1   hydrOXYzine   (ATARAX ) 50 MG tablet Take 1 tablet (50 mg total) by mouth 3 (three) times daily as needed. 30 tablet 5   insulin  lispro (HUMALOG  KWIKPEN) 100 UNIT/ML KwikPen Inject 1-15 Units into the skin 3 (three) times daily. 43 mL 1   Insulin  Pen Needle 31G X 5 MM MISC 1 each by Does not apply route at bedtime. 30 each 5   Insulin  Pen Needle 32G X 4 MM MISC 1 Needle by Does not apply route 3 (three) times daily. 200 each 5   Lancet Devices (ACCU-CHEK SOFTCLIX) lancets Use as instructed daily. 1 each 5   Lancets (FREESTYLE) lancets Use to check blood sugar three times daily. E11.49 100 each 3   metFORMIN  (GLUCOPHAGE ) 500 MG tablet TAKE 2 TABLETS (1,000 MG TOTAL) BY MOUTH 2 (TWO) TIMES DAILY WITH A MEAL. 360 tablet 1   nicotine  (NICODERM CQ  - DOSED IN MG/24 HOURS) 21 mg/24hr patch Place 1 patch (21 mg total) onto the skin daily. 28 patch 0   NIFEdipine  (PROCARDIA -XL/NIFEDICAL-XL) 30 MG 24 hr tablet Take 1 tablet (30 mg total) by mouth daily. 30 tablet 6   norethindrone  (AYGESTIN ) 5 MG tablet TAKE 1 TABLET BY MOUTH THREE TIMES A DAY 252 tablet 2   omeprazole  (PRILOSEC) 40 MG capsule TAKE 1 CAPSULE BY MOUTH EVERY  MORNING 100 capsule 0   pregabalin  (LYRICA ) 100 MG capsule Take 1 capsule (100 mg total) by mouth 2 (two) times daily. 60 capsule 5   saccharomyces boulardii (FLORASTOR) 250 MG capsule Take 1 capsule (250 mg total) by mouth 2 (two) times daily. 60 capsule 0   valACYclovir  (VALTREX ) 500 MG tablet TAKE 1 TABLET (500 MG TOTAL) BY MOUTH 2 (TWO) TIMES DAILY. FOR HERPES PROPHYLAXIS 180 tablet 1   indapamide  (LOZOL ) 1.25 MG tablet Take 1 tablet (1.25 mg total) by mouth daily. (Patient not taking: Reported on 07/26/2024) 90 tablet 3   insulin  glargine (LANTUS  SOLOSTAR) 100 UNIT/ML Solostar Pen Inject 25 Units into the skin daily. Increase by 2 units to a maximum daily dose of 30 units every 4th day until blood sugars are at goal (Patient not taking: Reported on 07/26/2024) 15 mL 6   LUPRON DEPOT, 64-MONTH, 11.25 MG  injection Inject 11.25 mg into the muscle every 3 (three) months. (Patient not taking: Reported on 07/26/2024)     Semaglutide  (RYBELSUS ) 14 MG TABS Take 1 tablet (14 mg total) by mouth daily. (Patient not taking: Reported on 07/26/2024) 90 tablet 1   spironolactone  (ALDACTONE ) 50 MG tablet Take 1 tablet (50 mg total) by mouth daily. (Patient not taking: Reported on 07/26/2024) 90 tablet 3   No current facility-administered medications for this visit.    Allergies as of 07/26/2024 - Review Complete 07/26/2024  Allergen Reaction Noted   Amlodipine   08/14/2022   Atorvastatin  Other (See Comments) 12/12/2020   Crestor  [rosuvastatin ]  01/01/2021   Interferon beta-1a Other (See Comments) 07/26/2024   Irbesartan   08/14/2022   Metronidazole  Nausea And Vomiting 08/26/2018   Penicillins  10/12/2016   Xanax [alprazolam]  10/12/2016   Glyburide Other (See Comments) 02/04/2015   Linagliptin Diarrhea and Other (See  Comments) 07/18/2014   Moxifloxacin Diarrhea 04/10/2014    Past Medical History:  Diagnosis Date   Abnormal Pap smear of cervix    yrs ago   Adenomyosis    Allergy     Anemia    Anxiety    Aortic atherosclerosis    Arthritis    Asthma    Chronic kidney disease    Chronic UTI    Clostridium difficile infection    Depression    Diabetes mellitus without complication (HCC) 10/22/2023   A1C 8.8   Diverticulosis    Endometriosis    Fibroid    Fibromyalgia    GERD (gastroesophageal reflux disease)    Hiatal hernia    Hyperlipidemia    Hypertension    Internal hemorrhoids    Meningitis, viral    Migraines    Pancreatitis    Pure hypercholesterolemia 12/23/2020   Stroke (HCC)    Tubular adenoma of colon     Past Surgical History:  Procedure Laterality Date   APPENDECTOMY  1990   CERVICAL BIOPSY  W/ LOOP ELECTRODE EXCISION     LEEP   CESAREAN SECTION  2002   CHOLECYSTECTOMY  2011   COLONOSCOPY     UPPER GASTROINTESTINAL ENDOSCOPY     URINARY SURGERY     urethra  sling, removal, and then revision 2016 (4 surgeries)   WISDOM TOOTH EXTRACTION      Review of Systems:    All systems reviewed and negative except where noted in HPI.    Physical Exam:  BP (!) 148/106   Ht 5' 3 (1.6 m)   Wt 149 lb 4 oz (67.7 kg)   BMI 26.44 kg/m  No LMP recorded.  General: Well-nourished, well-developed in no acute distress.  Lungs: Clear to auscultation bilaterally. Non-labored. Heart: Regular rate and rhythm, no murmurs rubs or gallops.  Abdomen: Bowel sounds are normal; Abdomen is Soft; No hepatosplenomegaly, masses or hernias; mild diffuse generalized abdominal Tenderness; No guarding or rebound tenderness. Rectal: Moderate large external hemorrhoids present.  1 swollen hemorrhoid, nonthrombosed.  Normal anal sphincter tone.  No rectal masses.  No anal fissure.  No stool in the vault.  Trace heme positive. Neuro: Alert and oriented x 3.  Grossly intact.  Psych: Alert and cooperative, anxious and manic mood and affect.  Chaperone for Exam:  Amber Rose, CMA   Imaging Studies: DG Chest Portable 1 View Result Date: 07/26/2024 CLINICAL DATA:  htn. EXAM: PORTABLE CHEST 1 VIEW COMPARISON:  02/24/2017. FINDINGS: Bilateral lung fields are clear. Bilateral costophrenic angles are clear. Normal cardio-mediastinal silhouette. No acute osseous abnormalities. The soft tissues are within normal limits. IMPRESSION: No active disease. Electronically Signed   By: Ree Molt M.D.   On: 07/26/2024 14:41    Labs: CBC    Component Value Date/Time   WBC 11.3 (H) 07/26/2024 1252   RBC 4.37 07/26/2024 1252   HGB 13.8 07/26/2024 1252   HGB 15.2 (H) 02/08/2024 1109   HGB 15.0 01/20/2024 1630   HCT 41.5 07/26/2024 1252   HCT 44.9 01/20/2024 1630   PLT 354 07/26/2024 1252   PLT 316 02/08/2024 1109   PLT 330 01/20/2024 1630   MCV 95.0 07/26/2024 1252   MCV 95 01/20/2024 1630   MCH 31.6 07/26/2024 1252   MCHC 33.3 07/26/2024 1252   RDW 13.2 07/26/2024 1252   RDW 12.7  01/20/2024 1630   LYMPHSABS 2.9 07/26/2024 1252   LYMPHSABS 2.5 01/20/2024 1630   MONOABS 0.6 07/26/2024 1252   EOSABS  0.1 07/26/2024 1252   EOSABS 0.1 01/20/2024 1630   BASOSABS 0.1 07/26/2024 1252   BASOSABS 0.1 01/20/2024 1630    CMP     Component Value Date/Time   NA 137 07/26/2024 1252   NA 140 05/05/2024 1456   K 4.0 07/26/2024 1252   CL 102 07/26/2024 1252   CO2 22 07/26/2024 1252   GLUCOSE 131 (H) 07/26/2024 1252   BUN 18 07/26/2024 1252   BUN 17 05/05/2024 1456   CREATININE 0.86 07/26/2024 1252   CREATININE 0.97 02/08/2024 1109   CREATININE 0.70 11/22/2023 0950   CALCIUM  9.6 07/26/2024 1252   PROT 6.9 07/26/2024 1252   PROT 6.6 01/20/2024 1630   ALBUMIN 4.1 07/26/2024 1252   ALBUMIN 4.1 01/20/2024 1630   AST 20 07/26/2024 1252   AST 12 (L) 02/08/2024 1109   ALT 17 07/26/2024 1252   ALT 13 02/08/2024 1109   ALKPHOS 79 07/26/2024 1252   BILITOT 0.4 07/26/2024 1252   BILITOT 0.4 02/08/2024 1109   GFRNONAA >60 07/26/2024 1252   GFRNONAA >60 02/08/2024 1109   GFRNONAA >89 10/28/2016 0851   GFRAA 127 12/12/2020 1149   GFRAA >89 10/28/2016 0851     Assessment and Plan:   Amber Rose is a 48 y.o. y/o female returns for follow-up of:  1.  External Hemorrhoids with bleeding -Rx hydrocortisone 2.5% cream apply 2-3 times daily for 2 weeks.  2.  Generalized abdominal pain - Labs:  CBC, CMP, Lipase, CRP  3.  Nausea  - Remain off Ozempic  - Continue omeprazole  40 mg daily for GERD - Recommend tight blood sugar diabetic control.  4.  Diarrhea and constipation - suspect irritable bowel syndrome  -Stool tests: C. difficile toxin PCR, fecal calprotectin - Celiac labs - Start OTC fiber supplement daily  5.  Weight loss: Most likely due to taking Ozempic  which was recently discontinued. - Labs TSH - Continue to monitor.  6.  Comorbidities: CVA 12/2023, uncontrolled hypertension, diabetes -I advised patient to follow-up with PCP and cardiology ASAP for  further management of uncontrolled hypertension.  I do not feel safe scheduling endoscopy procedures until blood pressure is under better control.  ED precautions discussed.  7.  History of adenomatous colon polyp - 7-year repeat colonoscopy will be due 07/2026.   Amber Console, PA-C  Follow up 4 to 6 weeks.  Also follow-up based on lab results.

## 2024-07-26 NOTE — Discharge Instructions (Signed)
 Your history, exam, workup today did not reveal findings that require admission or surgical management at this time.  As we discussed, I do suspect you may have some cold or viral illness causing the coughing but otherwise the CT scans did not show clear pneumonia or intra-abdominal pathology such as diverticulitis at this time.  We feel you are safe for discharge home and please start taking the blood pressure medicine and follow-up with your primary doctor to discuss further blood pressure management.  If any symptoms change or worsen acutely, please return to the nearest emergency department.

## 2024-07-27 ENCOUNTER — Other Ambulatory Visit

## 2024-07-27 DIAGNOSIS — M79601 Pain in right arm: Secondary | ICD-10-CM | POA: Diagnosis not present

## 2024-07-27 DIAGNOSIS — M545 Low back pain, unspecified: Secondary | ICD-10-CM | POA: Diagnosis not present

## 2024-07-27 DIAGNOSIS — R2689 Other abnormalities of gait and mobility: Secondary | ICD-10-CM | POA: Diagnosis not present

## 2024-07-27 DIAGNOSIS — R197 Diarrhea, unspecified: Secondary | ICD-10-CM | POA: Diagnosis not present

## 2024-07-27 DIAGNOSIS — M25551 Pain in right hip: Secondary | ICD-10-CM | POA: Diagnosis not present

## 2024-07-27 DIAGNOSIS — M6281 Muscle weakness (generalized): Secondary | ICD-10-CM | POA: Diagnosis not present

## 2024-07-27 LAB — CBC WITH DIFFERENTIAL/PLATELET
Basophils Absolute: 0.1 K/uL (ref 0.0–0.1)
Basophils Relative: 0.8 % (ref 0.0–3.0)
Eosinophils Absolute: 0.1 K/uL (ref 0.0–0.7)
Eosinophils Relative: 1.1 % (ref 0.0–5.0)
HCT: 41.4 % (ref 36.0–46.0)
Hemoglobin: 13.9 g/dL (ref 12.0–15.0)
Lymphocytes Relative: 23.3 % (ref 12.0–46.0)
Lymphs Abs: 2.4 K/uL (ref 0.7–4.0)
MCHC: 33.6 g/dL (ref 30.0–36.0)
MCV: 96.1 fl (ref 78.0–100.0)
Monocytes Absolute: 0.6 K/uL (ref 0.1–1.0)
Monocytes Relative: 5.7 % (ref 3.0–12.0)
Neutro Abs: 7 K/uL (ref 1.4–7.7)
Neutrophils Relative %: 69.1 % (ref 43.0–77.0)
Platelets: 343 K/uL (ref 150.0–400.0)
RBC: 4.31 Mil/uL (ref 3.87–5.11)
RDW: 13.9 % (ref 11.5–15.5)
WBC: 10.1 K/uL (ref 4.0–10.5)

## 2024-07-27 LAB — COMPREHENSIVE METABOLIC PANEL WITH GFR
ALT: 17 U/L (ref 0–35)
AST: 18 U/L (ref 0–37)
Albumin: 4.1 g/dL (ref 3.5–5.2)
Alkaline Phosphatase: 63 U/L (ref 39–117)
BUN: 17 mg/dL (ref 6–23)
CO2: 23 meq/L (ref 19–32)
Calcium: 9.2 mg/dL (ref 8.4–10.5)
Chloride: 101 meq/L (ref 96–112)
Creatinine, Ser: 0.8 mg/dL (ref 0.40–1.20)
GFR: 86.95 mL/min (ref >60.00)
Glucose, Bld: 90 mg/dL (ref 70–99)
Potassium: 4.1 meq/L (ref 3.5–5.1)
Sodium: 133 meq/L — ABNORMAL LOW (ref 135–145)
Total Bilirubin: 0.3 mg/dL (ref 0.2–1.2)
Total Protein: 7.2 g/dL (ref 6.0–8.3)

## 2024-07-27 LAB — C-REACTIVE PROTEIN: CRP: <0.5 mg/dL (ref 0.5–20.0)

## 2024-07-27 LAB — LIPASE: Lipase: 26 U/L (ref 11.0–59.0)

## 2024-07-27 LAB — TSH: TSH: 0.94 u[IU]/mL (ref 0.35–5.50)

## 2024-07-28 DIAGNOSIS — R197 Diarrhea, unspecified: Secondary | ICD-10-CM | POA: Diagnosis not present

## 2024-07-29 LAB — SPECIMEN STATUS REPORT

## 2024-07-29 LAB — CLOSTRIDIUM DIFFICILE BY PCR: Toxigenic C. Difficile by PCR: NEGATIVE

## 2024-07-30 ENCOUNTER — Ambulatory Visit: Payer: Self-pay | Admitting: Physician Assistant

## 2024-07-30 LAB — CELIAC DISEASE AB SCREEN W/RFX
Antigliadin Abs, IgA: 5 U (ref 0–19)
IgA/Immunoglobulin A, Serum: 390 mg/dL — ABNORMAL HIGH (ref 87–352)
Transglutaminase IgA: <2 U/mL (ref 0–3)

## 2024-07-30 LAB — CALPROTECTIN, FECAL: Calprotectin, Fecal: 12 ug/g (ref 0–120)

## 2024-07-31 ENCOUNTER — Encounter (HOSPITAL_BASED_OUTPATIENT_CLINIC_OR_DEPARTMENT_OTHER): Payer: Self-pay | Admitting: Pharmacist Clinician (PhC)/ Clinical Pharmacy Specialist

## 2024-07-31 ENCOUNTER — Ambulatory Visit (INDEPENDENT_AMBULATORY_CARE_PROVIDER_SITE_OTHER): Admitting: Pharmacist Clinician (PhC)/ Clinical Pharmacy Specialist

## 2024-07-31 VITALS — BP 170/112

## 2024-07-31 DIAGNOSIS — I1A0 Resistant hypertension: Secondary | ICD-10-CM | POA: Diagnosis not present

## 2024-07-31 MED ORDER — CARVEDILOL 6.25 MG PO TABS: 6.2500 mg | ORAL_TABLET | Freq: Two times a day (BID) | ORAL | 3 refills | Status: DC

## 2024-07-31 NOTE — Assessment & Plan Note (Addendum)
 Assessment: BP is uncontrolled in office BP 170/112 mmHg;  above the goal (<130/80). Currently only on hydrochlorothiazide  25 mg daily Tolerates hydrochlorothiazide , although reporting muscle cramping in legs Denies SOB, palpitation, chest pain, headaches,or swelling Reiterated the importance of regular exercise and low salt diet   Plan:  Start taking carvedilol  6.25 mg twice daily Continue taking hydrochlorothiazide  25 mg daily Try using calcium /magnesium/zinc combination to alleviate leg cramps Patient to keep record of BP readings with heart rate and report to us  at the next visit Patient to follow up with me in 2 months  Labs ordered today:  none Will review with Dr. Raford if pt eligible for renal denervation 2/2 medication intolerances.

## 2024-07-31 NOTE — Patient Instructions (Signed)
 Follow up appointment: Thursday Nov 20 at 11:30 am  Take your BP meds as follows:  Start carvedilol  6.25 mg twice daily.  Continue hydrochlorothiazide  25 mg once daily.     Try calcium /magnesium/zinc daily to help with muscle cramping.    Check your blood pressure at home 3-4 days per week and keep record of the readings.  Your blood pressure goal is < 130/80  To check your pressure at home you will need to:  1. Sit up in a chair, with feet flat on the floor and back supported. Do not cross your ankles or legs. 2. Rest your left arm so that the cuff is about heart level. If the cuff goes on your upper arm,  then just relax the arm on the table, arm of the chair or your lap. If you have a wrist cuff, we  suggest relaxing your wrist against your chest (think of it as Pledging the Flag with the  wrong arm).  3. Place the cuff snugly around your arm, about 1 inch above the crook of your elbow. The  cords should be inside the groove of your elbow.  4. Sit quietly, with the cuff in place, for about 5 minutes. After that 5 minutes press the power  button to start a reading. 5. Do not talk or move while the reading is taking place.  6. Record your readings on a sheet of paper. Although most cuffs have a memory, it is often  easier to see a pattern developing when the numbers are all in front of you.  7. You can repeat the reading after 1-3 minutes if it is recommended  Make sure your bladder is empty and you have not had caffeine or tobacco within the last 30 min  Always bring your blood pressure log with you to your appointments. If you have not brought your monitor in to be double checked for accuracy, please bring it to your next appointment.  You can find a list of quality blood pressure cuffs at [email or web address]  Important lifestyle changes to control high blood pressure  Intervention  Effect on the BP  Lose extra pounds and watch your waistline Weight loss is one of the most effective  lifestyle changes for controlling blood pressure. If you're overweight or obese, losing even a small amount of weight can help reduce blood pressure. Blood pressure might go down by about 1 millimeter of mercury (mm Hg) with each kilogram (about 2.2 pounds) of weight lost.  Exercise regularly As a general goal, aim for at least 30 minutes of moderate physical activity every day. Regular physical activity can lower high blood pressure by about 5 to 8 mm Hg.  Eat a healthy diet Eating a diet rich in whole grains, fruits, vegetables, and low-fat dairy products and low in saturated fat and cholesterol. A healthy diet can lower high blood pressure by up to 11 mm Hg.  Reduce salt (sodium) in your diet Even a small reduction of sodium in the diet can improve heart health and reduce high blood pressure by about 5 to 6 mm Hg.  Limit alcohol One drink equals 12 ounces of beer, 5 ounces of wine, or 1.5 ounces of 80-proof liquor.  Limiting alcohol to less than one drink a day for women or two drinks a day for men can help lower blood pressure by about 4 mm Hg.   If you have any questions or concerns please use My Chart to send questions or call the  office at [phone]

## 2024-07-31 NOTE — Progress Notes (Signed)
 07/31/2024 Peola Joynt 1976-07-01 [id number]   HPI:  Gwyn Hieronymus is a 48 y.o. female patient of Dr Raford, with a PMH below who presents today for advanced hypertension clinic follow up.  Patient was referred to our clinic by Dr Delbert in early 2022 for hypertension associated with multiple medication intolerances.  She had moved to [place] from the [place] and states that all of her current problems began about 6 years ago (in [place] ) when she was put on a cholesterol medication and 2 blood pressure medications, all at the same time.  She developed severe leg pains that at times left her unable to walk.  She also developed facial swelling that comes and goes, she believes in relation to her BP readings.  She states that she continued on statin drugs for most of that time and nobody associated it with her leg pains, and once she finally stopped, the pains decreased by 60-70%.  She has been started on multiple medications in different drug classes, but continues to note side effects, mostly leg pain with and/or facial swelling.  She believed the leg pains were due to the medications tightening and relaxing the blood vessels and asked for a medication that would not do this.  She ultimately chose to work on lifestyle modifications, including transitioning to a plant based diet.    I have seen her several times over the last couple of years and she continued to have challenges controlling her pressure.  Much of it comes from intolerances to multiple medications.  She will tolerate one medication for several months, then it becomes problematic and we find something else.  Currently she takes hydrochlorothiazide  25 mg daily.  She was in the ED for elevated BP last week (175/115), had stopped the spironolactone  because of stomach issues.  ED started hctz and encouraged follow up.       Past Medical History: HLD 10/23  LDL 51 - on Repatha   DM2 8/22  A1c 7.3 - on Ozempic  1 mg, not significant  weight loss ~6-8 pounds  CAD Aortic atherosclerosis  fibromyalgia Tolerates pregabalin   Tobacco abuse States has tried to quit multiple times, down to 4 cigarettes/day     Blood Pressure Goal:  130/80  Current Medications: spironolactone  50 mg  Family Hx:   father has hypertension, no heart disease; mother has had multiple cancers; doesn't know much about siblings health, kids 23/28/21 - no health issues  Social Hx:    smokes 4 cigarettes/day- had one this morning; over the past month has cut back from 1 ppd, also using 21 mg nicotine  patches; only occasional alcohol; no coffee or tea regularly,   Diet:   has stopped eating out, variety of vegetables and proteins,  has cut back on carbs like pastas, rice, potatoes.  More fruits and vegetables on a daily basis.  Occasional beans or eggs  Exercise: stretches, yoga pilates You-Tube videos; exercises from therapy; walk, bouncing ball  Home BP readings:   machine many years old - arm cuff   AM 18 readings average 143/88 HR 74  (range 129-168/80-97)  PM 10 readings average 142/93 HR 75  (range 115-171/78-99)  Intolerances:  atorvastatin , rosuvastatin  - joint pains  Lisinopril   - cough  Hydralazine  - headache, hot flashes  Amlodipine  - leg cramps, facial swelling  Irbesartan  - leg cramps, facial swelling  hctz - leg cramps  Candesartan  - leg cramps, facial swelling  Labs: 10/23:  Na 139, K 4.0, Glu 99, BUN 14, SCr 0.89, GFR  81   Wt Readings from Last 3 Encounters:  07/26/24 149 lb 4 oz (67.7 kg)  06/29/24 155 lb (70.3 kg)  05/29/24 156 lb (70.8 kg)   BP Readings from Last 3 Encounters:  07/31/24 (!) 170/112  07/26/24 (!) 187/101  07/26/24 (!) 148/106   Pulse Readings from Last 3 Encounters:  07/26/24 66  07/08/24 87  06/29/24 84    Current Outpatient Medications  Medication Sig Dispense Refill   albuterol  (PROAIR  HFA) 108 (90 Base) MCG/ACT inhaler Inhale 2 puffs into the lungs every 4 (four) hours as needed for wheezing or  shortness of breath. 3 Inhaler 0   aspirin  EC 81 MG tablet Take 1 tablet (81 mg total) by mouth daily. Swallow whole. 30 tablet 12   baclofen  (LIORESAL ) 10 MG tablet Take 1 tablet (10 mg total) by mouth 3 (three) times daily as needed for muscle spasms. 90 each 11   Blood Glucose Monitoring Suppl (ACCU-CHEK AVIVA PLUS) w/Device KIT USE AS DIRECTED DAILY. E11.9 1 kit 0   buPROPion  (WELLBUTRIN  XL) 150 MG 24 hr tablet Take 1 tablet (150 mg total) by mouth daily. 90 tablet 1   carvedilol  (COREG ) 6.25 MG tablet Take 1 tablet (6.25 mg total) by mouth 2 (two) times daily. 180 tablet 3   Continuous Glucose Receiver (FREESTYLE LIBRE 3 READER) DEVI 1 Device by Does not apply route continuous. 1 each 0   Continuous Glucose Sensor (FREESTYLE LIBRE 3 PLUS SENSOR) MISC Inject 1 Device into the skin continuous. Change every 15 days 6 each 3   Glucagon  (BAQSIMI  ONE PACK) 3 MG/DOSE POWD Place 1 Device into the nose as needed (Low blood sugar with impaired consciousness). 2 each 3   Glucagon  (GVOKE HYPOPEN  1-PACK) 1 MG/0.2ML SOAJ Inject 1 mg into the skin as needed (low blood sugar with impaired consciousness). 0.4 mL 2   glucose blood (FREESTYLE TEST STRIPS) test strip USE TO CHECK BLOOD SUGAR THREE TIMES DAILY. E11.49 100 strip 6   hydrochlorothiazide  (HYDRODIURIL ) 25 MG tablet Take 1 tablet (25 mg total) by mouth daily for 14 days. 14 tablet 0   hydrocortisone (ANUSOL-HC) 2.5 % rectal cream Place 1 Application rectally 2 (two) times daily. 30 g 1   hydrOXYzine  (ATARAX ) 50 MG tablet Take 1 tablet (50 mg total) by mouth 3 (three) times daily as needed. 30 tablet 5   insulin  lispro (HUMALOG  KWIKPEN) 100 UNIT/ML KwikPen Inject 1-15 Units into the skin 3 (three) times daily. 43 mL 1   Insulin  Pen Needle 31G X 5 MM MISC 1 each by Does not apply route at bedtime. 30 each 5   Insulin  Pen Needle 32G X 4 MM MISC 1 Needle by Does not apply route 3 (three) times daily. 200 each 5   Lancet Devices (ACCU-CHEK SOFTCLIX) lancets  Use as instructed daily. 1 each 5   Lancets (FREESTYLE) lancets Use to check blood sugar three times daily. E11.49 100 each 3   metFORMIN  (GLUCOPHAGE ) 500 MG tablet TAKE 2 TABLETS (1,000 MG TOTAL) BY MOUTH 2 (TWO) TIMES DAILY WITH A MEAL. 360 tablet 1   omeprazole  (PRILOSEC) 40 MG capsule TAKE 1 CAPSULE BY MOUTH EVERY  MORNING 100 capsule 0   pregabalin  (LYRICA ) 100 MG capsule Take 1 capsule (100 mg total) by mouth 2 (two) times daily. 60 capsule 5   saccharomyces boulardii (FLORASTOR) 250 MG capsule Take 1 capsule (250 mg total) by mouth 2 (two) times daily. 60 capsule 0   valACYclovir  (VALTREX ) 500 MG tablet TAKE  1 TABLET (500 MG TOTAL) BY MOUTH 2 (TWO) TIMES DAILY. FOR HERPES PROPHYLAXIS 180 tablet 1   Semaglutide  (RYBELSUS ) 14 MG TABS Take 1 tablet (14 mg total) by mouth daily. (Patient not taking: Reported on 07/31/2024) 90 tablet 1   No current facility-administered medications for this visit.    Allergies  Allergen Reactions   Amlodipine      Leg pain, swelling around eyes    Atorvastatin  Other (See Comments)     Joint pain   Crestor  [Rosuvastatin ]     Joint pain    Interferon Beta-1a Other (See Comments)   Irbesartan      Leg pain, swelling around eyes   Metronidazole  Nausea And Vomiting   Penicillins     childhood   Xanax [Alprazolam]     Caused upper respiratory symptoms per pt   Glyburide Other (See Comments)    blood sugar dropped uncontrollably   Linagliptin Diarrhea and Other (See Comments)    Stomach pain, sinus infection   Moxifloxacin Diarrhea    Past Medical History:  Diagnosis Date   Abnormal Pap smear of cervix    yrs ago   Adenomyosis    Allergy     Anemia    Anxiety    Aortic atherosclerosis    Arthritis    Asthma    Chronic kidney disease    Chronic UTI    Clostridium difficile infection    Depression    Diabetes mellitus without complication (HCC) 10/22/2023   A1C 8.8   Diverticulosis    Endometriosis    Fibroid    Fibromyalgia    GERD  (gastroesophageal reflux disease)    Hiatal hernia    Hyperlipidemia    Hypertension    Internal hemorrhoids    Meningitis, viral    Migraines    Pancreatitis    Pure hypercholesterolemia 12/23/2020   Stroke (HCC)    Tubular adenoma of colon     Blood pressure (!) 170/112.   Hypertension Assessment: BP is uncontrolled in office BP 170/112 mmHg;  above the goal (<130/80). Currently only on hydrochlorothiazide  25 mg daily Tolerates hydrochlorothiazide , although reporting muscle cramping in legs Denies SOB, palpitation, chest pain, headaches,or swelling Reiterated the importance of regular exercise and low salt diet   Plan:  Start taking carvedilol  6.25 mg twice daily Continue taking hydrochlorothiazide  25 mg daily Try using calcium /magnesium/zinc combination to alleviate leg cramps Patient to keep record of BP readings with heart rate and report to us  at the next visit Patient to follow up with me in 2 months  Labs ordered today:  none Will review with Dr. Raford if pt eligible for renal denervation 2/2 medication intolerances.    Lorisa Scheid PharmD CPP [facility] [address]  [place], [place] [address] [phone]

## 2024-08-02 ENCOUNTER — Other Ambulatory Visit: Payer: Self-pay | Admitting: Family Medicine

## 2024-08-03 DIAGNOSIS — M79601 Pain in right arm: Secondary | ICD-10-CM | POA: Diagnosis not present

## 2024-08-03 DIAGNOSIS — M6281 Muscle weakness (generalized): Secondary | ICD-10-CM | POA: Diagnosis not present

## 2024-08-03 DIAGNOSIS — R2689 Other abnormalities of gait and mobility: Secondary | ICD-10-CM | POA: Diagnosis not present

## 2024-08-03 DIAGNOSIS — M545 Low back pain, unspecified: Secondary | ICD-10-CM | POA: Diagnosis not present

## 2024-08-03 DIAGNOSIS — M25551 Pain in right hip: Secondary | ICD-10-CM | POA: Diagnosis not present

## 2024-08-08 DIAGNOSIS — R2689 Other abnormalities of gait and mobility: Secondary | ICD-10-CM | POA: Diagnosis not present

## 2024-08-08 DIAGNOSIS — M6281 Muscle weakness (generalized): Secondary | ICD-10-CM | POA: Diagnosis not present

## 2024-08-08 DIAGNOSIS — M79601 Pain in right arm: Secondary | ICD-10-CM | POA: Diagnosis not present

## 2024-08-08 DIAGNOSIS — M545 Low back pain, unspecified: Secondary | ICD-10-CM | POA: Diagnosis not present

## 2024-08-08 DIAGNOSIS — M25551 Pain in right hip: Secondary | ICD-10-CM | POA: Diagnosis not present

## 2024-08-09 ENCOUNTER — Other Ambulatory Visit: Payer: Self-pay

## 2024-08-09 ENCOUNTER — Telehealth: Payer: Self-pay

## 2024-08-09 DIAGNOSIS — E559 Vitamin D deficiency, unspecified: Secondary | ICD-10-CM

## 2024-08-09 DIAGNOSIS — D500 Iron deficiency anemia secondary to blood loss (chronic): Secondary | ICD-10-CM

## 2024-08-09 DIAGNOSIS — E538 Deficiency of other specified B group vitamins: Secondary | ICD-10-CM

## 2024-08-09 NOTE — Telephone Encounter (Signed)
 Spoke with patient and confirmed appointment on 10/16

## 2024-08-10 ENCOUNTER — Inpatient Hospital Stay: Admitting: Hematology and Oncology

## 2024-08-10 ENCOUNTER — Ambulatory Visit: Payer: Self-pay | Admitting: Hematology and Oncology

## 2024-08-10 ENCOUNTER — Inpatient Hospital Stay: Attending: Hematology and Oncology

## 2024-08-10 ENCOUNTER — Telehealth: Payer: Self-pay | Admitting: Pharmacy Technician

## 2024-08-10 VITALS — BP 162/90 | HR 72 | Temp 98.2°F | Resp 16 | Wt 158.5 lb

## 2024-08-10 DIAGNOSIS — M6281 Muscle weakness (generalized): Secondary | ICD-10-CM | POA: Diagnosis not present

## 2024-08-10 DIAGNOSIS — Z8673 Personal history of transient ischemic attack (TIA), and cerebral infarction without residual deficits: Secondary | ICD-10-CM | POA: Diagnosis not present

## 2024-08-10 DIAGNOSIS — N920 Excessive and frequent menstruation with regular cycle: Secondary | ICD-10-CM | POA: Diagnosis not present

## 2024-08-10 DIAGNOSIS — Z7982 Long term (current) use of aspirin: Secondary | ICD-10-CM | POA: Diagnosis not present

## 2024-08-10 DIAGNOSIS — D500 Iron deficiency anemia secondary to blood loss (chronic): Secondary | ICD-10-CM

## 2024-08-10 DIAGNOSIS — M79601 Pain in right arm: Secondary | ICD-10-CM | POA: Diagnosis not present

## 2024-08-10 DIAGNOSIS — D509 Iron deficiency anemia, unspecified: Secondary | ICD-10-CM | POA: Diagnosis present

## 2024-08-10 DIAGNOSIS — M25551 Pain in right hip: Secondary | ICD-10-CM | POA: Diagnosis not present

## 2024-08-10 DIAGNOSIS — R2689 Other abnormalities of gait and mobility: Secondary | ICD-10-CM | POA: Diagnosis not present

## 2024-08-10 DIAGNOSIS — E538 Deficiency of other specified B group vitamins: Secondary | ICD-10-CM | POA: Insufficient documentation

## 2024-08-10 DIAGNOSIS — M545 Low back pain, unspecified: Secondary | ICD-10-CM | POA: Diagnosis not present

## 2024-08-10 DIAGNOSIS — E559 Vitamin D deficiency, unspecified: Secondary | ICD-10-CM

## 2024-08-10 LAB — CBC WITH DIFFERENTIAL (CANCER CENTER ONLY)
Abs Immature Granulocytes: 0.02 K/uL (ref 0.00–0.07)
Basophils Absolute: 0.1 K/uL (ref 0.0–0.1)
Basophils Relative: 1 %
Eosinophils Absolute: 0.2 K/uL (ref 0.0–0.5)
Eosinophils Relative: 2 %
HCT: 33 % — ABNORMAL LOW (ref 36.0–46.0)
Hemoglobin: 11.3 g/dL — ABNORMAL LOW (ref 12.0–15.0)
Immature Granulocytes: 0 %
Lymphocytes Relative: 17 %
Lymphs Abs: 1.6 K/uL (ref 0.7–4.0)
MCH: 32 pg (ref 26.0–34.0)
MCHC: 34.2 g/dL (ref 30.0–36.0)
MCV: 93.5 fL (ref 80.0–100.0)
Monocytes Absolute: 0.6 K/uL (ref 0.1–1.0)
Monocytes Relative: 6 %
Neutro Abs: 7 K/uL (ref 1.7–7.7)
Neutrophils Relative %: 74 %
Platelet Count: 345 K/uL (ref 150–400)
RBC: 3.53 MIL/uL — ABNORMAL LOW (ref 3.87–5.11)
RDW: 14.5 % (ref 11.5–15.5)
WBC Count: 9.4 K/uL (ref 4.0–10.5)
nRBC: 0 % (ref 0.0–0.2)

## 2024-08-10 LAB — CMP (CANCER CENTER ONLY)
ALT: 21 U/L (ref 0–44)
AST: 17 U/L (ref 15–41)
Albumin: 3.6 g/dL (ref 3.5–5.0)
Alkaline Phosphatase: 60 U/L (ref 38–126)
Anion gap: 3 — ABNORMAL LOW (ref 5–15)
BUN: 15 mg/dL (ref 6–20)
CO2: 27 mmol/L (ref 22–32)
Calcium: 8.9 mg/dL (ref 8.9–10.3)
Chloride: 109 mmol/L (ref 98–111)
Creatinine: 0.8 mg/dL (ref 0.44–1.00)
GFR, Estimated: >60 mL/min (ref >60)
Glucose, Bld: 107 mg/dL — ABNORMAL HIGH (ref 70–99)
Potassium: 4.1 mmol/L (ref 3.5–5.1)
Sodium: 139 mmol/L (ref 135–145)
Total Bilirubin: 0.2 mg/dL (ref 0.0–1.2)
Total Protein: 6.2 g/dL — ABNORMAL LOW (ref 6.5–8.1)

## 2024-08-10 LAB — VITAMIN B12: Vitamin B-12: 537 pg/mL (ref 180–914)

## 2024-08-10 LAB — IRON AND IRON BINDING CAPACITY (CC-WL,HP ONLY)
Iron: 24 ug/dL — ABNORMAL LOW (ref 28–170)
Saturation Ratios: 7 % — ABNORMAL LOW (ref 10.4–31.8)
TIBC: 351 ug/dL (ref 250–450)
UIBC: 327 ug/dL (ref 148–442)

## 2024-08-10 LAB — FERRITIN: Ferritin: 32 ng/mL (ref 11–307)

## 2024-08-10 MED ORDER — CLONIDINE 0.1 MG/24HR TD PTWK: 0.1000 mg | MEDICATED_PATCH | TRANSDERMAL | 12 refills | Status: DC

## 2024-08-10 NOTE — Telephone Encounter (Signed)
 Good afternoon,   Please see messages from patient below. Do you believe aspirin  is causing her anemia or at least contributing to it? I would recommend she continue to take more so from a stroke standpoint but if you feel the risk outweighs the benefit, please let me know!   Thank you for your input! Harlene, NP

## 2024-08-10 NOTE — Progress Notes (Signed)
 [facility] Cancer Follow up:    Amber Clam, MD [address] [place] [address] [place] [address]   DIAGNOSIS: IRON  DEFICIENCY ANEMIA   SUMMARY OF HEMATOLOGIC HISTORY: Initial patient consult with Dr. Loretha on Mar 06, 2023 for leukocytosis, testing indicated iron  deficiency with a ferritin of 6 and iron  saturation of 7%. Venofer  given at 300 mg weekly x 3 beginning March 31, 2023.  CURRENT THERAPY: intermittent IV iron   INTERVAL HISTORY:  Discussed the use of AI scribe software for clinical note transcription with the patient, who gave verbal consent to proceed.  Amber Rose 48 y.o. female , a patient with a history of stroke, iron  deficiency, B12 deficiency, and gynecological issues, presents for follow up.  Discussed the use of AI scribe software for clinical note transcription with the patient, who gave verbal consent to proceed.  History of Present Illness Amber Rose is a 48 year old female with anemia who presents with heavy menstrual bleeding.  She has been experiencing heavy menstrual bleeding, describing it as 'like a murder scene,' which she attributes to a recent drop in hemoglobin levels from 13.8 to 11.3 g/dL. Her menstrual cycles have become more regular, occurring every 26 days, but the bleeding is significantly heavier since starting aspirin  therapy after a stroke. Each cycle lasts seven to eight days, with bleeding described as 'watery and heavy.' Despite the heavy bleeding, there are no associated cramps or pain, which she previously experienced when more anemic.  She has a history of stroke and is currently on aspirin  therapy, which she suspects may be contributing to the increased menstrual bleeding. She describes the flow as 'like water pouring out' and notes that it lasts longer than usual. She uses tampons, pads, and additional coverage, needing to change them every hour due to the heaviness of the flow.  She has a history of  anemia and has previously received iron  infusions, which she tolerated well. She feels tired and has been taking supplements, including B12. She previously had a high B12 level after receiving a B12 shot and has since switched to a pill form to avoid excessive levels.  She mentions a persistent cough lasting about a month and a half, which coincided with a period of high blood pressure that led to hospitalization. Various tests, including scans, did not reveal significant findings.     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/2022   Steatorrhea 08/17/2022   Irregular menses 11/28/2021   Perimenopause 11/28/2021   Episode of recurrent major depressive disorder 04/09/2021   BMI 33.0-33.9,adult 04/09/2021   Pure hypercholesterolemia 12/23/2020   Menorrhagia with regular cycle 09/13/2020   Well woman exam 09/13/2020   History of herpes zoster 01/17/2018   Overactive bladder 12/28/2017   Recurrent infections 11/19/2017   Cyst of nasal cavity 11/19/2017   Angio-edema 11/19/2017   Abdominal pain, epigastric 11/04/2017   Dysphagia 11/04/2017   Gastroesophageal reflux disease 07/12/2017   Degenerative disc disease at L5-S1 level 05/14/2017  Fibromyalgia 05/14/2017   ADD (attention deficit disorder) 05/14/2017   Urinary incontinence 04/05/2017   Thigh pain 02/02/2017   Anxiety and depression 02/02/2017   Tobacco abuse 02/02/2017    Facial flushing 02/02/2017   Hypertension 10/23/2016   Hidradenitis 10/23/2016   Diabetes (HCC) 09/12/2014   Lupus anticoagulant positive 07/03/2014   Vitamin D  deficiency 06/06/2014    is allergic to amlodipine , atorvastatin , crestor  [rosuvastatin ], interferon beta-1a, irbesartan , metronidazole , penicillins, xanax [alprazolam], glyburide, linagliptin, and moxifloxacin.  MEDICAL HISTORY: Past Medical History:  Diagnosis Date   Abnormal Pap smear of cervix    yrs ago   Adenomyosis    Allergy     Anemia    Anxiety    Aortic atherosclerosis    Arthritis    Asthma    Chronic kidney disease    Chronic UTI    Clostridium difficile infection    Depression    Diabetes mellitus without complication (HCC) 10/22/2023   A1C 8.8   Diverticulosis    Endometriosis    Fibroid    Fibromyalgia    GERD (gastroesophageal reflux disease)    Hiatal hernia    Hyperlipidemia    Hypertension    Internal hemorrhoids    Meningitis, viral    Migraines    Pancreatitis    Pure hypercholesterolemia 12/23/2020   Stroke (HCC)    Tubular adenoma of colon     SURGICAL HISTORY: Past Surgical History:  Procedure Laterality Date   APPENDECTOMY  1990   CERVICAL BIOPSY  W/ LOOP ELECTRODE EXCISION     LEEP   CESAREAN SECTION  2002   CHOLECYSTECTOMY  2011   COLONOSCOPY     UPPER GASTROINTESTINAL ENDOSCOPY     URINARY SURGERY     urethra sling, removal, and then revision 2016 (4 surgeries)   WISDOM TOOTH EXTRACTION      SOCIAL HISTORY: Social History   Socioeconomic History   Marital status: Single    Spouse name: Not on file   Number of children: 3   Years of education: Not on file   Highest education level: Not on file  Occupational History   Not on file  Tobacco Use   Smoking status: Former    Current packs/day: 0.10    Average packs/day: 0.1 packs/day for 20.0 years (2.0 ttl pk-yrs)    Types: Cigarettes   Smokeless tobacco: Never   Tobacco comments:    Patient is engaged in  health coaching for smoking cessation as of 12/26/20. Patient stated that she has stopped smoking, drinking and any form of drug usage since her stroke.  Vaping Use   Vaping status: Never Used  Substance and Sexual Activity   Alcohol use: Not Currently    Comment: occ   Drug use: Not Currently    Types: Marijuana    Comment: occ   Sexual activity: Yes    Partners: Male    Birth control/protection: Surgical    Comment: BTL  Other Topics Concern   Not on file  Social History Narrative   Lives home with adult niece.  Not working.  Disability pending.  Pt is single.  Education GED.  3 children.    Social Drivers of Corporate investment banker Strain: Low Risk  (01/25/2024)   Overall Financial Resource Strain (CARDIA)    Difficulty of Paying Living Expenses: Not very hard  Food Insecurity: Food Insecurity Present (01/25/2024)   Hunger Vital Sign    Worried About Running Out of Food in the Last Year: Sometimes  true    Ran Out of Food in the Last Year: Sometimes true  Transportation Needs: No Transportation Needs (01/25/2024)   PRAPARE - Administrator, Civil Service (Medical): No    Lack of Transportation (Non-Medical): No  Physical Activity: Insufficiently Active (01/25/2024)   Exercise Vital Sign    Days of Exercise per Week: 3 days    Minutes of Exercise per Session: 30 min  Stress: Stress Concern Present (01/25/2024)   [employer] of Occupational Health - Occupational Stress Questionnaire    Feeling of Stress : Rather much  Social Connections: Socially Isolated (01/25/2024)   Social Connection and Isolation Panel    Frequency of Communication with Friends and Family: Three times a week    Frequency of Social Gatherings with Friends and Family: Once a week    Attends Religious Services: Never    Database administrator or Organizations: No    Attends Banker Meetings: Never    Marital Status: Never married  Intimate Partner Violence: Not At Risk (01/25/2024)    Humiliation, Afraid, Rape, and Kick questionnaire    Fear of Current or Ex-Partner: No    Emotionally Abused: No    Physically Abused: No    Sexually Abused: No    FAMILY HISTORY: Family History  Problem Relation Age of Onset   Cancer Mother        cervical, breast, lung, skin, bladder-ex smoker   Hypertension Mother    Diabetes Father    Heart attack Maternal Grandfather    Multiple sclerosis Paternal Grandmother      PHYSICAL EXAMINATION    Vitals:   08/10/24 0949  BP: (!) 162/90  Pulse: 72  Resp: 16  Temp: 98.2 F (36.8 C)  SpO2: 100%    Physical Exam Constitutional:      General: She is not in acute distress.    Appearance: Normal appearance. She is not toxic-appearing.  HENT:     Head: Normocephalic and atraumatic.     Mouth/Throat:     Mouth: Mucous membranes are moist.     Pharynx: Oropharynx is clear. No oropharyngeal exudate or posterior oropharyngeal erythema.  Eyes:     General: No scleral icterus. Cardiovascular:     Rate and Rhythm: Normal rate and regular rhythm.     Pulses: Normal pulses.     Heart sounds: Normal heart sounds.  Pulmonary:     Effort: Pulmonary effort is normal.     Breath sounds: Normal breath sounds.  Abdominal:     General: Abdomen is flat. Bowel sounds are normal. There is no distension.     Palpations: Abdomen is soft.     Tenderness: There is no abdominal tenderness.  Musculoskeletal:        General: No swelling.     Cervical back: Neck supple.  Lymphadenopathy:     Cervical: No cervical adenopathy.  Skin:    General: Skin is warm and dry.     Findings: No rash.  Neurological:     General: No focal deficit present.     Mental Status: She is alert.  Psychiatric:        Mood and Affect: Mood normal.        Behavior: Behavior normal.     LABORATORY DATA:  CBC    Component Value Date/Time   WBC 9.4 08/10/2024 0913   WBC 11.3 (H) 07/26/2024 1252   RBC 3.53 (L) 08/10/2024 0913   HGB 11.3 (L) 08/10/2024 0913  HGB 15.0 01/20/2024 1630   HCT 33.0 (L) 08/10/2024 0913   HCT 44.9 01/20/2024 1630   PLT 345 08/10/2024 0913   PLT 330 01/20/2024 1630   MCV 93.5 08/10/2024 0913   MCV 95 01/20/2024 1630   MCH 32.0 08/10/2024 0913   MCHC 34.2 08/10/2024 0913   RDW 14.5 08/10/2024 0913   RDW 12.7 01/20/2024 1630   LYMPHSABS 1.6 08/10/2024 0913   LYMPHSABS 2.5 01/20/2024 1630   MONOABS 0.6 08/10/2024 0913   EOSABS 0.2 08/10/2024 0913   EOSABS 0.1 01/20/2024 1630   BASOSABS 0.1 08/10/2024 0913   BASOSABS 0.1 01/20/2024 1630    CMP     Component Value Date/Time   NA 137 07/26/2024 1252   NA 140 05/05/2024 1456   K 4.0 07/26/2024 1252   CL 102 07/26/2024 1252   CO2 22 07/26/2024 1252   GLUCOSE 131 (H) 07/26/2024 1252   BUN 18 07/26/2024 1252   BUN 17 05/05/2024 1456   CREATININE 0.86 07/26/2024 1252   CREATININE 0.97 02/08/2024 1109   CREATININE 0.70 11/22/2023 0950   CALCIUM  9.6 07/26/2024 1252   PROT 6.9 07/26/2024 1252   PROT 6.6 01/20/2024 1630   ALBUMIN 4.1 07/26/2024 1252   ALBUMIN 4.1 01/20/2024 1630   AST 20 07/26/2024 1252   AST 12 (L) 02/08/2024 1109   ALT 17 07/26/2024 1252   ALT 13 02/08/2024 1109   ALKPHOS 79 07/26/2024 1252   BILITOT 0.4 07/26/2024 1252   BILITOT 0.4 02/08/2024 1109   GFRNONAA >60 07/26/2024 1252   GFRNONAA >60 02/08/2024 1109   GFRNONAA >89 10/28/2016 0851   GFRAA 127 12/12/2020 1149   GFRAA >89 10/28/2016 0851     ASSESSMENT and THERAPY PLAN:   Assessment and Plan Assessment & Plan Iron  deficiency anemia secondary to heavy menstrual bleeding Hemoglobin decreased from 13.8 to 11.3 g/dL, likely due to heavy menstrual bleeding. Iron  deficiency suspected. - Order iron  studies. - Arrange Venofer  iron  infusion weekly for three sessions. - Coordinate infusion scheduling at [facility].  Perimenopausal state with menstrual irregularity Experiencing menstrual irregularities consistent with perimenopause, including heavy and prolonged bleeding. -  Advise gynecologist consultation for menstrual irregularities.  Ischemic stroke on aspirin  therapy Aspirin  therapy continued for stroke prevention. Benefits outweigh bleeding risks. - Continue aspirin  therapy.  B12 supplementation Previously elevated B12 levels due to supplementation.  B12 levels today normal Ok to continue intermittent B12 supplementation.  Recent imaging and lab results Mild bronchial thickening and small right adnexal cyst noted. Pt reports no FH of ovarian cancer, mom have had cervical cancer Recommended to discuss with PCP if she learns of FH of ovarian cancer for further evaluation of recently noted adnexal cyst.   All questions were answered. The patient knows to call the clinic with any problems, questions or concerns. We can certainly see the patient much sooner if necessary.  Total encounter time:30 minutes*in face-to-face visit time, chart review, lab review, care coordination, order entry, and documentation of the encounter time.  *Total Encounter Time as defined by the Centers for Medicare and Medicaid Services includes, in addition to the face-to-face time of a patient visit (documented in the note above) non-face-to-face time: obtaining and reviewing outside history, ordering and reviewing medications, tests or procedures, care coordination (communications with other health care professionals or caregivers) and documentation in the medical record.

## 2024-08-10 NOTE — Telephone Encounter (Signed)
 Auth Submission: NO AUTH NEEDED Site of care: Site of care: [facility] Payer: [employer] Medication & CPT/J Code(s) submitted: Venofer  (Iron  Sucrose) J1756 Diagnosis Code: D50.9 Route of submission (phone, fax, portal):  Phone # Fax # Auth type: Buy/Bill PB Units/visits requested: 300MG  X3 DOSES Reference number:  Approval from: 08/10/24 to 10/25/24

## 2024-08-10 NOTE — Assessment & Plan Note (Signed)
 Assessment and Plan Assessment & Plan

## 2024-08-11 ENCOUNTER — Other Ambulatory Visit: Payer: Self-pay

## 2024-08-11 ENCOUNTER — Encounter: Payer: Self-pay | Admitting: Internal Medicine

## 2024-08-11 ENCOUNTER — Encounter: Payer: Self-pay | Admitting: Neurology

## 2024-08-11 ENCOUNTER — Encounter: Payer: Self-pay | Admitting: Hematology and Oncology

## 2024-08-11 DIAGNOSIS — R1084 Generalized abdominal pain: Secondary | ICD-10-CM

## 2024-08-11 DIAGNOSIS — R197 Diarrhea, unspecified: Secondary | ICD-10-CM

## 2024-08-11 NOTE — Telephone Encounter (Signed)
-----   Message from [place] Iruku sent at 08/10/2024  6:42 PM EDT ----- Ferritin dropping, so like we talked its a good idea to move forward with iron  infusion. B12 levels are normal. Now regarding the adnexal cyst noted on most recent imaging, can u call her gyn and ask  them to follow up on this. Please let the pt know the recommendations from gynecology, ----- Message ----- From: Interface, Lab In [place] Sent: 08/10/2024   9:18 AM EDT To: Amber Stalls, MD

## 2024-08-11 NOTE — Telephone Encounter (Signed)
 Per Dr.Iruku, called pt with message above. Pt verbalized understanding.

## 2024-08-14 MED ORDER — CARVEDILOL 12.5 MG PO TABS: 12.5000 mg | ORAL_TABLET | Freq: Two times a day (BID) | ORAL | 3 refills | Status: AC

## 2024-08-14 NOTE — Addendum Note (Signed)
 Addended by: Wah Sabic L on: 08/14/2024 06:23 AM   Modules accepted: Orders

## 2024-08-14 NOTE — Telephone Encounter (Signed)
 Thank you for your input.  I do recommend she continue aspirin  therapy due to her history of a stroke. She should f/u with OBGYN as advised below (and as you also advised at recent visit) in regards to heavy menstrual cycle. Thank you for your input!

## 2024-08-15 ENCOUNTER — Other Ambulatory Visit (INDEPENDENT_AMBULATORY_CARE_PROVIDER_SITE_OTHER)

## 2024-08-15 ENCOUNTER — Ambulatory Visit: Admitting: Nurse Practitioner

## 2024-08-15 DIAGNOSIS — R197 Diarrhea, unspecified: Secondary | ICD-10-CM | POA: Diagnosis not present

## 2024-08-15 DIAGNOSIS — M25551 Pain in right hip: Secondary | ICD-10-CM | POA: Diagnosis not present

## 2024-08-15 DIAGNOSIS — R2689 Other abnormalities of gait and mobility: Secondary | ICD-10-CM | POA: Diagnosis not present

## 2024-08-15 DIAGNOSIS — M6281 Muscle weakness (generalized): Secondary | ICD-10-CM | POA: Diagnosis not present

## 2024-08-15 DIAGNOSIS — R1084 Generalized abdominal pain: Secondary | ICD-10-CM

## 2024-08-15 DIAGNOSIS — M79601 Pain in right arm: Secondary | ICD-10-CM | POA: Diagnosis not present

## 2024-08-15 DIAGNOSIS — M545 Low back pain, unspecified: Secondary | ICD-10-CM | POA: Diagnosis not present

## 2024-08-16 ENCOUNTER — Ambulatory Visit

## 2024-08-16 ENCOUNTER — Other Ambulatory Visit (HOSPITAL_COMMUNITY): Payer: Self-pay | Admitting: Pharmacy Technician

## 2024-08-16 ENCOUNTER — Encounter: Payer: Self-pay | Admitting: "Endocrinology

## 2024-08-16 VITALS — BP 143/88 | HR 72 | Temp 98.7°F | Resp 16 | Ht 64.0 in | Wt 158.2 lb

## 2024-08-16 DIAGNOSIS — R1084 Generalized abdominal pain: Secondary | ICD-10-CM | POA: Diagnosis not present

## 2024-08-16 DIAGNOSIS — D509 Iron deficiency anemia, unspecified: Secondary | ICD-10-CM | POA: Diagnosis not present

## 2024-08-16 DIAGNOSIS — R197 Diarrhea, unspecified: Secondary | ICD-10-CM | POA: Diagnosis not present

## 2024-08-16 MED ORDER — SODIUM CHLORIDE 0.9 % IV SOLN
300.0000 mg | Freq: Once | INTRAVENOUS | Status: AC
  Administered 2024-08-16: 300 mg via INTRAVENOUS
  Filled 2024-08-16: qty 15

## 2024-08-16 NOTE — Progress Notes (Signed)
 Diagnosis: Iron  Deficiency Anemia  Provider:  Praveen Mannam MD  Procedure: IV Infusion  IV Type: Peripheral, IV Location: L Forearm  Venofer  (Iron  Sucrose), Dose: 300 mg  Infusion Start Time: 0845  Infusion Stop Time: 1026  Post Infusion IV Care: Observation period completed and Peripheral IV Discontinued  Discharge: Condition: Good, Destination: Home . AVS Declined  Performed by:  Leita FORBES Miles, LPN

## 2024-08-17 DIAGNOSIS — M25551 Pain in right hip: Secondary | ICD-10-CM | POA: Diagnosis not present

## 2024-08-17 DIAGNOSIS — M545 Low back pain, unspecified: Secondary | ICD-10-CM | POA: Diagnosis not present

## 2024-08-17 DIAGNOSIS — R2689 Other abnormalities of gait and mobility: Secondary | ICD-10-CM | POA: Diagnosis not present

## 2024-08-17 DIAGNOSIS — M6281 Muscle weakness (generalized): Secondary | ICD-10-CM | POA: Diagnosis not present

## 2024-08-17 DIAGNOSIS — M79601 Pain in right arm: Secondary | ICD-10-CM | POA: Diagnosis not present

## 2024-08-22 ENCOUNTER — Encounter: Payer: Self-pay | Admitting: Family Medicine

## 2024-08-22 ENCOUNTER — Other Ambulatory Visit: Payer: Self-pay | Admitting: Family Medicine

## 2024-08-22 ENCOUNTER — Encounter: Payer: Self-pay | Admitting: Obstetrics and Gynecology

## 2024-08-22 ENCOUNTER — Other Ambulatory Visit (HOSPITAL_COMMUNITY)
Admission: RE | Admit: 2024-08-22 | Discharge: 2024-08-22 | Disposition: A | Discharge status: Home / Self Care | Source: Ambulatory Visit

## 2024-08-22 ENCOUNTER — Ambulatory Visit (INDEPENDENT_AMBULATORY_CARE_PROVIDER_SITE_OTHER): Admitting: "Endocrinology

## 2024-08-22 ENCOUNTER — Encounter: Payer: Self-pay | Admitting: "Endocrinology

## 2024-08-22 ENCOUNTER — Ambulatory Visit (INDEPENDENT_AMBULATORY_CARE_PROVIDER_SITE_OTHER)

## 2024-08-22 ENCOUNTER — Encounter: Payer: Self-pay | Admitting: Adult Health

## 2024-08-22 VITALS — BP 130/80 | HR 71 | Ht 64.0 in | Wt 155.0 lb

## 2024-08-22 VITALS — BP 148/94 | HR 72 | Ht 64.0 in | Wt 154.1 lb

## 2024-08-22 DIAGNOSIS — E119 Type 2 diabetes mellitus without complications: Secondary | ICD-10-CM

## 2024-08-22 DIAGNOSIS — Z7984 Long term (current) use of oral hypoglycemic drugs: Secondary | ICD-10-CM | POA: Diagnosis not present

## 2024-08-22 DIAGNOSIS — E11649 Type 2 diabetes mellitus with hypoglycemia without coma: Secondary | ICD-10-CM

## 2024-08-22 DIAGNOSIS — N898 Other specified noninflammatory disorders of vagina: Secondary | ICD-10-CM

## 2024-08-22 DIAGNOSIS — R82998 Other abnormal findings in urine: Secondary | ICD-10-CM | POA: Diagnosis not present

## 2024-08-22 DIAGNOSIS — E782 Mixed hyperlipidemia: Secondary | ICD-10-CM

## 2024-08-22 DIAGNOSIS — Z794 Long term (current) use of insulin: Secondary | ICD-10-CM

## 2024-08-22 DIAGNOSIS — I152 Hypertension secondary to endocrine disorders: Secondary | ICD-10-CM

## 2024-08-22 LAB — PANCREATIC ELASTASE, FECAL: Pancreatic Elastase-1, Stool: >800 ug/g (ref >200)

## 2024-08-22 LAB — POCT GLYCOSYLATED HEMOGLOBIN (HGB A1C): Hemoglobin A1C: 5.2 % (ref 4.0–5.6)

## 2024-08-22 MED ORDER — LIDOCAINE 5 % EX PTCH: 1.0000 | MEDICATED_PATCH | CUTANEOUS | 1 refills | Status: DC

## 2024-08-22 NOTE — Progress Notes (Signed)
   GYNECOLOGY PROGRESS NOTE  History:  48 y.o. [id number] presents to [facility] office today for problem gyn visit. She reports ammonia-like odor coming from the vagina, urine and stool for about a year. She also notes red specks in the underwear and urine. She has had other health concerns during this timeframe so has been unable to address it.  She denies h/a, dizziness, shortness of breath, n/v, or fever/chills.    The following portions of the patient's history were reviewed and updated as appropriate: allergies, current medications, past family history, past medical history, past social history, past surgical history and problem list. Last pap smear on 09/13/2020 was normal, negative HRHPV.  Health Maintenance Due  Topic Date Due   COVID-19 Vaccine (1) Never done   Hepatitis B Vaccines 19-59 Average Risk (1 of 3 - 19+ 3-dose series) Never done   Influenza Vaccine  Never done   DTaP/Tdap/Td (2 - Td or Tdap) 07/08/2024   OPHTHALMOLOGY EXAM  07/27/2024     Review of Systems:  Pertinent items are noted in HPI.   Objective:  Physical Exam Blood pressure (!) 148/94, pulse 72, height 5' 4 (1.626 m), weight 154 lb 1.6 oz (69.9 kg), last menstrual period 07/30/2024. VS reviewed, nursing note reviewed,  Constitutional: well developed, well nourished, no distress HEENT: normocephalic CV: normal rate Pulm/chest wall: normal effort Breast Exam: deferred Abdomen: soft Neuro: alert and oriented x 3 Skin: warm, dry Psych: affect normal Pelvic exam: Deferred  Assessment & Plan:  1. Vaginal odor (Primary) History of recurrent BV, now presenting with 1 year of ammonia-like odor from vagina, urine and stool. Has been getting stool testing with PCP office. Requesting testing for Mycoplasmas. Denies itching or bleeding at this time. - Cervicovaginal ancillary only( [place]) - Genital Mycoplasmas NAA, Swab  2. Foamy urine Concern for ammonia-like smell in urine, along with foam and red  specks in underwear. Will send urine for further testing. - Urinalysis, Routine w reflex microscopic    Return if symptoms worsen or fail to improve.   Charlie DELENA Courts, MD 2:30 PM

## 2024-08-22 NOTE — Progress Notes (Signed)
 Outpatient Endocrinology Note Amber Birmingham, MD  08/22/24   Amber Rose 1976-04-16 [id number]  Referring Provider: Delbert Clam, MD Primary Care Provider: Delbert Clam, MD Reason for consultation: Subjective   Assessment & Plan  Diagnoses and all orders for this visit:  Uncontrolled type 2 diabetes mellitus with hypoglycemia without coma (HCC) -     POCT glycosylated hemoglobin (Hb A1C)  Long term (current) use of oral hypoglycemic drugs  Mixed hypercholesterolemia and hypertriglyceridemia   Diabetes Type II complicated by hyperglycemia,  Lab Results  Component Value Date   GFR 86.95 07/26/2024   Hba1c goal less than 7, current Hba1c is  Lab Results  Component Value Date   HGBA1C 5.2 08/22/2024   Will recommend the following: Metformin  500 mg 2 tabs bid  Stop libre as patient finds it misleading  Stop Rybelsus  14 mg qam given low BG and poor appetite 06/29/24: Stop Ozempic  1 mg per week, S/E of abdominal pain, poor appetite, excess weight loss  Stop Humalog  scale: Use it 15 min before you eat: Space it out by 4 hours.  201 - 225: 3 units 226 - 250: 4 units 251 - 275: 5 units 276 - 300: 6 units 301 - 325: 7 units 326 - 350: 8 units 351 - 375: 9 units 376 - 400: 10 units   Ordered DM education previously   No known contraindications/side effects to any of above medications No history of MEN syndrome/medullary thyroid  cancer/pancreatitis or pancreatic cancer in self or family Stopped ozempic  2mg  3 mo ago due to weight loss-lost 30-40 lbs but now wants it again Stopped Lantus  14 units qpm   -Last LD and Tg are as follows: Lab Results  Component Value Date   LDLCALC 177 (H) 05/25/2024    Lab Results  Component Value Date   TRIG 130 05/25/2024   -not on statin, not interested after detailed discussion, says she has the shot approved, scared to take it-counseled pt -Follow low fat diet and exercise   -Blood pressure goal <140/90 -  Microalbumin/creatinine goal is < 30 -Last MA/Cr is as follows: Lab Results  Component Value Date   MICROALBUR 1.3 11/22/2023   -not on ACE/ARB  -diet changes including salt restriction -limit eating outside -counseled BP targets per standards of diabetes care -uncontrolled blood pressure can lead to retinopathy, nephropathy and cardiovascular and atherosclerotic heart disease  Reviewed and counseled on: -A1C target -Blood sugar targets -Complications of uncontrolled diabetes  -Checking blood sugar before meals and bedtime and bring log next visit -All medications with mechanism of action and side effects -Hypoglycemia management: rule of 15's, Glucagon  Emergency Kit and medical alert ID -low-carb low-fat plate-method diet -At least 20 minutes of physical activity per day -Annual dilated retinal eye exam and foot exam -compliance and follow up needs -follow up as scheduled or earlier if problem gets worse  Call if blood sugar is less than 70 or consistently above 250    Take a 15 gm snack of carbohydrate at bedtime before you go to sleep if your blood sugar is less than 100.    If you are going to fast after midnight for a test or procedure, ask your physician for instructions on how to reduce/decrease your insulin  dose.    Call if blood sugar is less than 70 or consistently above 250  -Treating a low sugar by rule of 15  (15 gms of sugar every 15 min until sugar is more than 70) If you feel your  sugar is low, test your sugar to be sure If your sugar is low (less than 70), then take 15 grams of a fast acting Carbohydrate (3-4 glucose tablets or glucose gel or 4 ounces of juice or regular soda) Recheck your sugar 15 min after treating low to make sure it is more than 70 If sugar is still less than 70, treat again with 15 grams of carbohydrate          Don't drive the hour of hypoglycemia  If unconscious/unable to eat or drink by mouth, use glucagon  injection or nasal spray  baqsimi  and call 911. Can repeat again in 15 min if still unconscious.  Return in about 3 months (around 11/22/2024).   I have reviewed current medications, nurse's notes, allergies, vital signs, past medical and surgical history, family medical history, and social history for this encounter. Counseled patient on symptoms, examination findings, lab findings, imaging results, treatment decisions and monitoring and prognosis. The patient understood the recommendations and agrees with the treatment plan. All questions regarding treatment plan were fully answered.  Amber Birmingham, MD  08/22/24  History of Present Illness Amber Rose is a 48 y.o. year old female who presents for follow up of Type II diabetes mellitus.  Amber Rose was first diagnosed in around 2015.   Diabetes education +  Works at night 4 pm -7 am Sleeps [place]-5am, 8:30am-11:30am  8am is BF, lunch is around noon, dinner at 5:30pm and 9 pm is snack   Home diabetes regimen: Metformin  500mg  2 tabs bid Rybelsus  14 mg qam   Stopped Lantus  18 units qpm Stopped ozempic  2mg  3 mo ago due to weight loss-lost 30-40 lbs   COMPLICATIONS -  MI/Stroke -  retinopathy -  neuropathy -  nephropathy   BLOOD SUGAR DATA CGM interpretation: At today's visit, we reviewed her CGM downloads. The full report is scanned in the media. Reviewing the CGM trends, BG are well controlled across the day with frequent lows scattered across the day as well as some rare highs.  Physical Exam  BP 130/80   Pulse 71   Ht 5' 4 (1.626 m)   Wt 155 lb (70.3 kg)   SpO2 98%   BMI 26.61 kg/m    Constitutional: well developed, well nourished Head: normocephalic, atraumatic Eyes: sclera anicteric, no redness Neck: supple Lungs: normal respiratory effort Neurology: alert and oriented Skin: dry, no appreciable rashes Musculoskeletal: no appreciable defects Psychiatric: normal mood and affect Diabetic Foot Exam - Simple   No data filed       Current Medications Patient's Medications  New Prescriptions   No medications on file  Previous Medications   ALBUTEROL  (PROAIR  HFA) 108 (90 BASE) MCG/ACT INHALER    Inhale 2 puffs into the lungs every 4 (four) hours as needed for wheezing or shortness of breath.   ASPIRIN  EC 81 MG TABLET    Take 1 tablet (81 mg total) by mouth daily. Swallow whole.   BACLOFEN  (LIORESAL ) 10 MG TABLET    Take 1 tablet (10 mg total) by mouth 3 (three) times daily as needed for muscle spasms.   BLOOD GLUCOSE MONITORING SUPPL (ACCU-CHEK AVIVA PLUS) W/DEVICE KIT    USE AS DIRECTED DAILY. E11.9   BUPROPION  (WELLBUTRIN  XL) 150 MG 24 HR TABLET    Take 1 tablet (150 mg total) by mouth daily.   CARVEDILOL  (COREG ) 12.5 MG TABLET    Take 1 tablet (12.5 mg total) by mouth 2 (two) times daily.   CLONIDINE (CATAPRES -  DOSED IN MG/24 HR) 0.1 MG/24HR PATCH    Place 1 patch (0.1 mg total) onto the skin once a week.   CONTINUOUS GLUCOSE RECEIVER (FREESTYLE LIBRE 3 READER) DEVI    1 Device by Does not apply route continuous.   CONTINUOUS GLUCOSE SENSOR (FREESTYLE LIBRE 3 PLUS SENSOR) MISC    Inject 1 Device into the skin continuous. Change every 15 days   GLUCAGON  (BAQSIMI  ONE PACK) 3 MG/DOSE POWD    Place 1 Device into the nose as needed (Low blood sugar with impaired consciousness).   GLUCAGON  (GVOKE HYPOPEN  1-PACK) 1 MG/0.2ML SOAJ    Inject 1 mg into the skin as needed (low blood sugar with impaired consciousness).   GLUCOSE BLOOD (FREESTYLE TEST STRIPS) TEST STRIP    USE TO CHECK BLOOD SUGAR THREE TIMES DAILY. E11.49   HYDROCHLOROTHIAZIDE  (HYDRODIURIL ) 25 MG TABLET    Take 1 tablet (25 mg total) by mouth daily for 14 days.   HYDROCORTISONE (ANUSOL-HC) 2.5 % RECTAL CREAM    Place 1 Application rectally 2 (two) times daily.   HYDROXYZINE  (ATARAX ) 50 MG TABLET    Take 1 tablet (50 mg total) by mouth 3 (three) times daily as needed.   INSULIN  LISPRO (HUMALOG  KWIKPEN) 100 UNIT/ML KWIKPEN    Inject 1-15 Units into the skin 3  (three) times daily.   INSULIN  PEN NEEDLE 31G X 5 MM MISC    1 each by Does not apply route at bedtime.   INSULIN  PEN NEEDLE 32G X 4 MM MISC    1 Needle by Does not apply route 3 (three) times daily.   LANCET DEVICES (ACCU-CHEK SOFTCLIX) LANCETS    Use as instructed daily.   LANCETS (FREESTYLE) LANCETS    Use to check blood sugar three times daily. E11.49   METFORMIN  (GLUCOPHAGE ) 500 MG TABLET    TAKE 2 TABLETS (1,000 MG TOTAL) BY MOUTH 2 (TWO) TIMES DAILY WITH A MEAL.   OMEPRAZOLE  (PRILOSEC) 40 MG CAPSULE    TAKE 1 CAPSULE BY MOUTH EVERY  MORNING   PREGABALIN  (LYRICA ) 100 MG CAPSULE    Take 1 capsule (100 mg total) by mouth 2 (two) times daily.   SACCHAROMYCES BOULARDII (FLORASTOR) 250 MG CAPSULE    Take 1 capsule (250 mg total) by mouth 2 (two) times daily.   SEMAGLUTIDE  (RYBELSUS ) 14 MG TABS    Take 1 tablet (14 mg total) by mouth daily.   VALACYCLOVIR  (VALTREX ) 500 MG TABLET    TAKE 1 TABLET (500 MG TOTAL) BY MOUTH 2 (TWO) TIMES DAILY. FOR HERPES PROPHYLAXIS  Modified Medications   No medications on file  Discontinued Medications   No medications on file    Allergies Allergies  Allergen Reactions   Amlodipine      Leg pain, swelling around eyes    Atorvastatin  Other (See Comments)     Joint pain   Crestor  [Rosuvastatin ]     Joint pain    Interferon Beta-1a Other (See Comments)   Irbesartan      Leg pain, swelling around eyes   Metronidazole  Nausea And Vomiting   Penicillins     childhood   Xanax [Alprazolam]     Caused upper respiratory symptoms per pt   Glyburide Other (See Comments)    blood sugar dropped uncontrollably   Linagliptin Diarrhea and Other (See Comments)    Stomach pain, sinus infection   Moxifloxacin Diarrhea    Past Medical History Past Medical History:  Diagnosis Date   Abnormal Pap smear of cervix  yrs ago   Adenomyosis    Allergy     Anemia    Anxiety    Aortic atherosclerosis    Arthritis    Asthma    Chronic kidney disease    Chronic  UTI    Clostridium difficile infection    Depression    Diabetes mellitus without complication (HCC) 10/22/2023   A1C 8.8   Diverticulosis    Endometriosis    Fibroid    Fibromyalgia    GERD (gastroesophageal reflux disease)    Hiatal hernia    Hyperlipidemia    Hypertension    Internal hemorrhoids    Meningitis, viral    Migraines    Pancreatitis    Pure hypercholesterolemia 12/23/2020   Stroke (HCC)    Tubular adenoma of colon     Past Surgical History Past Surgical History:  Procedure Laterality Date   APPENDECTOMY  1990   CERVICAL BIOPSY  W/ LOOP ELECTRODE EXCISION     LEEP   CESAREAN SECTION  2002   CHOLECYSTECTOMY  2011   COLONOSCOPY     UPPER GASTROINTESTINAL ENDOSCOPY     URINARY SURGERY     urethra sling, removal, and then revision 2016 (4 surgeries)   WISDOM TOOTH EXTRACTION      Family History family history includes Cancer in her mother; Diabetes in her father; Heart attack in her maternal grandfather; Hypertension in her mother; Multiple sclerosis in her paternal grandmother.  Social History Social History   Socioeconomic History   Marital status: Single    Spouse name: Not on file   Number of children: 3   Years of education: Not on file   Highest education level: Not on file  Occupational History   Not on file  Tobacco Use   Smoking status: Former    Current packs/day: 0.10    Average packs/day: 0.1 packs/day for 20.0 years (2.0 ttl pk-yrs)    Types: Cigarettes   Smokeless tobacco: Never   Tobacco comments:    Patient is engaged in health coaching for smoking cessation as of 12/26/20. Patient stated that she has stopped smoking, drinking and any form of drug usage since her stroke.  Vaping Use   Vaping status: Never Used  Substance and Sexual Activity   Alcohol use: Not Currently    Comment: occ   Drug use: Not Currently    Types: Marijuana    Comment: occ   Sexual activity: Yes    Partners: Male    Birth control/protection: Surgical     Comment: BTL  Other Topics Concern   Not on file  Social History Narrative   Lives home with adult niece.  Not working.  Disability pending.  Pt is single.  Education GED.  3 children.    Social Drivers of Corporate Investment Banker Strain: Low Risk  (01/25/2024)   Overall Financial Resource Strain (CARDIA)    Difficulty of Paying Living Expenses: Not very hard  Food Insecurity: Food Insecurity Present (01/25/2024)   Hunger Vital Sign    Worried About Running Out of Food in the Last Year: Sometimes true    Ran Out of Food in the Last Year: Sometimes true  Transportation Needs: No Transportation Needs (01/25/2024)   PRAPARE - Administrator, Civil Service (Medical): No    Lack of Transportation (Non-Medical): No  Physical Activity: Insufficiently Active (01/25/2024)   Exercise Vital Sign    Days of Exercise per Week: 3 days    Minutes of Exercise  per Session: 30 min  Stress: Stress Concern Present (01/25/2024)   [employer] of Occupational Health - Occupational Stress Questionnaire    Feeling of Stress : Rather much  Social Connections: Socially Isolated (01/25/2024)   Social Connection and Isolation Panel    Frequency of Communication with Friends and Family: Three times a week    Frequency of Social Gatherings with Friends and Family: Once a week    Attends Religious Services: Never    Active Member of Clubs or Organizations: No    Attends Banker Meetings: Never    Marital Status: Never married  Intimate Partner Violence: Not At Risk (01/25/2024)   Humiliation, Afraid, Rose, and Kick questionnaire    Fear of Current or Ex-Partner: No    Emotionally Abused: No    Physically Abused: No    Sexually Abused: No    Lab Results  Component Value Date   HGBA1C 5.2 08/22/2024   HGBA1C 6.7 (A) 04/13/2024   HGBA1C 6.4 (H) 01/08/2024   Lab Results  Component Value Date   CHOL 251 (H) 05/25/2024   Lab Results  Component Value Date   HDL 51 05/25/2024    Lab Results  Component Value Date   LDLCALC 177 (H) 05/25/2024   Lab Results  Component Value Date   TRIG 130 05/25/2024   Lab Results  Component Value Date   CHOLHDL 4.9 (H) 05/25/2024   Lab Results  Component Value Date   CREATININE 0.80 08/10/2024   Lab Results  Component Value Date   GFR 86.95 07/26/2024   Lab Results  Component Value Date   MICROALBUR 1.3 11/22/2023      Component Value Date/Time   NA 139 08/10/2024 0913   NA 140 05/05/2024 1456   K 4.1 08/10/2024 0913   CL 109 08/10/2024 0913   CO2 27 08/10/2024 0913   GLUCOSE 107 (H) 08/10/2024 0913   BUN 15 08/10/2024 0913   BUN 17 05/05/2024 1456   CREATININE 0.80 08/10/2024 0913   CREATININE 0.70 11/22/2023 0950   CALCIUM  8.9 08/10/2024 0913   PROT 6.2 (L) 08/10/2024 0913   PROT 6.6 01/20/2024 1630   ALBUMIN 3.6 08/10/2024 0913   ALBUMIN 4.1 01/20/2024 1630   AST 17 08/10/2024 0913   ALT 21 08/10/2024 0913   ALKPHOS 60 08/10/2024 0913   BILITOT 0.2 08/10/2024 0913   GFRNONAA >60 08/10/2024 0913   GFRNONAA >89 10/28/2016 0851   GFRAA 127 12/12/2020 1149   GFRAA >89 10/28/2016 0851      Latest Ref Rng & Units 08/10/2024    9:13 AM 07/26/2024   12:52 PM 07/26/2024   11:40 AM  BMP  Glucose 70 - 99 mg/dL 892  868  90   BUN 6 - 20 mg/dL 15  18  17    Creatinine 0.44 - 1.00 mg/dL 9.19  9.13  9.19   Sodium 135 - 145 mmol/L 139  137  133   Potassium 3.5 - 5.1 mmol/L 4.1  4.0  4.1   Chloride 98 - 111 mmol/L 109  102  101   CO2 22 - 32 mmol/L 27  22  23    Calcium  8.9 - 10.3 mg/dL 8.9  9.6  9.2        Component Value Date/Time   WBC 9.4 08/10/2024 0913   WBC 11.3 (H) 07/26/2024 1252   RBC 3.53 (L) 08/10/2024 0913   HGB 11.3 (L) 08/10/2024 0913   HGB 15.0 01/20/2024 1630   HCT 33.0 (L)  08/10/2024 0913   HCT 44.9 01/20/2024 1630   PLT 345 08/10/2024 0913   PLT 330 01/20/2024 1630   MCV 93.5 08/10/2024 0913   MCV 95 01/20/2024 1630   MCH 32.0 08/10/2024 0913   MCHC 34.2 08/10/2024 0913   RDW  14.5 08/10/2024 0913   RDW 12.7 01/20/2024 1630   LYMPHSABS 1.6 08/10/2024 0913   LYMPHSABS 2.5 01/20/2024 1630   MONOABS 0.6 08/10/2024 0913   EOSABS 0.2 08/10/2024 0913   EOSABS 0.1 01/20/2024 1630   BASOSABS 0.1 08/10/2024 0913   BASOSABS 0.1 01/20/2024 1630     Parts of this note may have been dictated using voice recognition software. There may be variances in spelling and vocabulary which are unintentional. Not all errors are proofread. Please notify the dino if any discrepancies are noted or if the meaning of any statement is not clear.

## 2024-08-22 NOTE — Patient Instructions (Signed)

## 2024-08-22 NOTE — Progress Notes (Signed)
 Pt presents for recurrent vaginal odor (possible bv) spotting, and leakage of urine. Pt states that she had a pap within the last few years.

## 2024-08-23 ENCOUNTER — Ambulatory Visit (INDEPENDENT_AMBULATORY_CARE_PROVIDER_SITE_OTHER)

## 2024-08-23 ENCOUNTER — Ambulatory Visit: Payer: Self-pay | Admitting: Physician Assistant

## 2024-08-23 VITALS — BP 145/83 | HR 67 | Temp 98.0°F | Resp 20 | Ht 64.0 in | Wt 154.4 lb

## 2024-08-23 DIAGNOSIS — D509 Iron deficiency anemia, unspecified: Secondary | ICD-10-CM | POA: Diagnosis not present

## 2024-08-23 LAB — CERVICOVAGINAL ANCILLARY ONLY
Bacterial Vaginitis (gardnerella): NEGATIVE
Candida Glabrata: NEGATIVE
Candida Vaginitis: NEGATIVE
Chlamydia: NEGATIVE
Comment: NEGATIVE
Comment: NEGATIVE
Comment: NEGATIVE
Comment: NEGATIVE
Comment: NEGATIVE
Comment: NORMAL
Neisseria Gonorrhea: NEGATIVE
Trichomonas: NEGATIVE

## 2024-08-23 LAB — URINALYSIS, ROUTINE W REFLEX MICROSCOPIC
Bilirubin, UA: NEGATIVE
Glucose, UA: NEGATIVE
Ketones, UA: NEGATIVE
Leukocytes,UA: NEGATIVE
Nitrite, UA: NEGATIVE
Protein,UA: NEGATIVE
RBC, UA: NEGATIVE
Specific Gravity, UA: 1.017 (ref 1.005–1.030)
Urobilinogen, Ur: 0.2 mg/dL (ref 0.2–1.0)
pH, UA: 5.5 (ref 5.0–7.5)

## 2024-08-23 MED ORDER — SODIUM CHLORIDE 0.9 % IV SOLN
300.0000 mg | Freq: Once | INTRAVENOUS | Status: AC
  Administered 2024-08-23: 300 mg via INTRAVENOUS
  Filled 2024-08-23: qty 15

## 2024-08-23 NOTE — Progress Notes (Signed)
 Diagnosis: Iron  Deficiency Anemia  Provider:  Praveen Mannam MD  Procedure: IV Infusion  IV Type: Peripheral, IV Location: L Forearm  Venofer  (Iron  Sucrose), Dose: 300 mg  Infusion Start Time: 0834  Infusion Stop Time: 1008  Post Infusion IV Care: Patient declined observation and Peripheral IV Discontinued  Discharge: Condition: Good, Destination: Home . AVS Declined  Performed by:  Maximiano JONELLE Pouch, LPN

## 2024-08-24 DIAGNOSIS — M25551 Pain in right hip: Secondary | ICD-10-CM | POA: Diagnosis not present

## 2024-08-24 DIAGNOSIS — M79601 Pain in right arm: Secondary | ICD-10-CM | POA: Diagnosis not present

## 2024-08-24 DIAGNOSIS — R2689 Other abnormalities of gait and mobility: Secondary | ICD-10-CM | POA: Diagnosis not present

## 2024-08-24 DIAGNOSIS — M6281 Muscle weakness (generalized): Secondary | ICD-10-CM | POA: Diagnosis not present

## 2024-08-24 DIAGNOSIS — M545 Low back pain, unspecified: Secondary | ICD-10-CM | POA: Diagnosis not present

## 2024-08-25 ENCOUNTER — Encounter: Payer: Self-pay | Admitting: "Endocrinology

## 2024-08-25 ENCOUNTER — Other Ambulatory Visit: Payer: Self-pay | Admitting: Family Medicine

## 2024-08-25 DIAGNOSIS — R202 Paresthesia of skin: Secondary | ICD-10-CM

## 2024-08-25 DIAGNOSIS — M79652 Pain in left thigh: Secondary | ICD-10-CM

## 2024-08-25 LAB — GENITAL MYCOPLASMAS NAA, SWAB
Mycoplasma genitalium NAA: NEGATIVE
Mycoplasma hominis NAA: NEGATIVE
Ureaplasma spp NAA: POSITIVE — AB

## 2024-08-26 ENCOUNTER — Ambulatory Visit: Payer: Self-pay

## 2024-08-26 MED ORDER — DOXYCYCLINE HYCLATE 100 MG PO CAPS: 100.0000 mg | ORAL_CAPSULE | Freq: Two times a day (BID) | ORAL | 0 refills | Status: AC

## 2024-08-27 ENCOUNTER — Telehealth: Payer: Self-pay | Admitting: Physician Assistant

## 2024-08-27 DIAGNOSIS — R197 Diarrhea, unspecified: Secondary | ICD-10-CM

## 2024-08-27 MED ORDER — CHOLESTYRAMINE 4 G PO PACK: 4.0000 g | PACK | Freq: Every day | ORAL | 5 refills | Status: DC

## 2024-08-27 NOTE — Telephone Encounter (Signed)
 I sent a prescription for cholestyramine powder, 1 packet in a drink once daily for diarrhea, #30, 3 refills.

## 2024-08-30 ENCOUNTER — Ambulatory Visit

## 2024-08-31 ENCOUNTER — Ambulatory Visit: Admitting: Obstetrics

## 2024-09-06 ENCOUNTER — Ambulatory Visit

## 2024-09-12 ENCOUNTER — Ambulatory Visit: Admitting: Physician Assistant

## 2024-09-14 ENCOUNTER — Ambulatory Visit (INDEPENDENT_AMBULATORY_CARE_PROVIDER_SITE_OTHER)

## 2024-09-14 ENCOUNTER — Ambulatory Visit (INDEPENDENT_AMBULATORY_CARE_PROVIDER_SITE_OTHER): Admitting: Pharmacist Clinician (PhC)/ Clinical Pharmacy Specialist

## 2024-09-14 VITALS — BP 136/72 | HR 71 | Ht 64.0 in | Wt 159.6 lb

## 2024-09-14 VITALS — BP 124/77 | HR 73 | Temp 98.5°F | Resp 16 | Ht 64.0 in | Wt 159.6 lb

## 2024-09-14 DIAGNOSIS — D509 Iron deficiency anemia, unspecified: Secondary | ICD-10-CM | POA: Diagnosis not present

## 2024-09-14 DIAGNOSIS — I1A0 Resistant hypertension: Secondary | ICD-10-CM | POA: Diagnosis not present

## 2024-09-14 MED ORDER — IRON SUCROSE 300 MG IVPB - SIMPLE MED
300.0000 mg | Freq: Once | Status: AC
  Administered 2024-09-14: 300 mg via INTRAVENOUS
  Filled 2024-09-14: qty 265

## 2024-09-14 NOTE — Progress Notes (Signed)
 09/18/2024 Amber Rose Apr 12, 1976 [id number]   HPI:  Amber Rose is a 48 y.o. female patient of Dr Raford, with a PMH below who presents today for advanced hypertension clinic follow up.  Patient was referred to our clinic by Dr Delbert in early 2022 for hypertension associated with multiple medication intolerances.  She had moved to [place] from the [place] and states that all of her current problems began about 6 years ago (in [place] ) when she was put on a cholesterol medication and 2 blood pressure medications, all at the same time.  She developed severe leg pains that at times left her unable to walk.  She also developed facial swelling that comes and goes, she believes in relation to her BP readings.  She states that she continued on statin drugs for most of that time and nobody associated it with her leg pains, and once she finally stopped, the pains decreased by 60-70%.  She has been started on multiple medications in different drug classes, but continues to note side effects, mostly leg pain with and/or facial swelling.  She believed the leg pains were due to the medications tightening and relaxing the blood vessels and asked for a medication that would not do this.  She ultimately chose to work on lifestyle modifications, including transitioning to a plant based diet.    I have seen her several times over the last couple of years and she continued to have challenges controlling her pressure.  Much of it comes from intolerances to multiple medications.  She will tolerate one medication for several months, then it becomes problematic and we find something else.  Most recently we had her on HCTZ.  When second medication is added to BP regimen, she routinely will stop the first because of ongoing side effects.  Currently she takes carvedilol  12.5 mg and reports that it has caused her to have shoulder pain, which she is treating with lidocaine  patches provided by her PCP.  Her pressure  in the office today is about as good as we've seen.   She requested a nephrology consult from PCP for her BP issues, is awaiting appointment.     Past Medical History: HLD 10/23  LDL 51 - on Repatha   DM2 8/22  A1c 7.3 - on Ozempic  1 mg, not significant weight loss ~6-8 pounds  CAD Aortic atherosclerosis  fibromyalgia Tolerates pregabalin   Tobacco abuse States has tried to quit multiple times, down to 4 cigarettes/day     Blood Pressure Goal:  130/80  Current Medications: carvedilol  12.5 mg bid  Family Hx:   father has hypertension, no heart disease; mother has had multiple cancers; doesn't know much about siblings health, kids 23/28/21 - no health issues  Social Hx:    smokes 4 cigarettes/day- had one this morning; over the past month has cut back from 1 ppd, also using 21 mg nicotine  patches; only occasional alcohol; no coffee or tea regularly,   Diet:   has stopped eating out, variety of vegetables and proteins,  has cut back on carbs like pastas, rice, potatoes.  More fruits and vegetables on a daily basis.  Occasional beans or eggs  Exercise: stretches, yoga pilates You-Tube videos; exercises from therapy; walk, bouncing ball  Home BP readings:   machine many years old - arm cuff - no readings with her today   Intolerances:  atorvastatin , rosuvastatin  - joint pains  Lisinopril   - cough  Hydralazine  - headache, hot flashes  Amlodipine  - leg cramps, facial swelling  Irbesartan  - leg cramps, facial swelling  hctz - leg cramps  Candesartan  - leg cramps, facial swelling  Labs: 10/23:  Na 139, K 4.0, Glu 99, BUN 14, SCr 0.89, GFR 81   Wt Readings from Last 3 Encounters:  09/15/24 161 lb (73 kg)  09/14/24 159 lb 9.6 oz (72.4 kg)  09/14/24 159 lb 9.6 oz (72.4 kg)   BP Readings from Last 3 Encounters:  09/15/24 (!) 136/93  09/14/24 124/77  09/14/24 136/72   Pulse Readings from Last 3 Encounters:  09/15/24 67  09/14/24 73  09/14/24 71    Current Outpatient Medications   Medication Sig Dispense Refill   albuterol  (PROAIR  HFA) 108 (90 Base) MCG/ACT inhaler Inhale 2 puffs into the lungs every 4 (four) hours as needed for wheezing or shortness of breath. 3 Inhaler 0   aspirin  EC 81 MG tablet Take 1 tablet (81 mg total) by mouth daily. Swallow whole. 30 tablet 12   baclofen  (LIORESAL ) 10 MG tablet Take 1 tablet (10 mg total) by mouth 3 (three) times daily as needed for muscle spasms. 90 each 11   Blood Glucose Monitoring Suppl (ACCU-CHEK AVIVA PLUS) w/Device KIT USE AS DIRECTED DAILY. E11.9 1 kit 0   buPROPion  (WELLBUTRIN  XL) 150 MG 24 hr tablet Take 1 tablet (150 mg total) by mouth daily. 90 tablet 1   carvedilol  (COREG ) 12.5 MG tablet Take 1 tablet (12.5 mg total) by mouth 2 (two) times daily. 180 tablet 3   cholestyramine  (QUESTRAN ) 4 g packet Take 1 packet (4 g total) by mouth daily. (Patient taking differently: Take 4 g by mouth daily as needed.) 30 packet 5   Continuous Glucose Receiver (FREESTYLE LIBRE 3 READER) DEVI 1 Device by Does not apply route continuous. 1 each 0   Continuous Glucose Sensor (FREESTYLE LIBRE 3 PLUS SENSOR) MISC Inject 1 Device into the skin continuous. Change every 15 days 6 each 3   EC-RX Testosterone  0.2 % CREA Place onto the skin as directed.     Glucagon  (BAQSIMI  ONE PACK) 3 MG/DOSE POWD Place 1 Device into the nose as needed (Low blood sugar with impaired consciousness). 2 each 3   Glucagon  (GVOKE HYPOPEN  1-PACK) 1 MG/0.2ML SOAJ Inject 1 mg into the skin as needed (low blood sugar with impaired consciousness). 0.4 mL 2   glucose blood (FREESTYLE TEST STRIPS) test strip USE TO CHECK BLOOD SUGAR THREE TIMES DAILY. E11.49 100 strip 6   hydrocortisone  (ANUSOL -HC) 2.5 % rectal cream Place 1 Application rectally 2 (two) times daily. 30 g 1   hydrOXYzine  (ATARAX ) 50 MG tablet Take 1 tablet (50 mg total) by mouth 3 (three) times daily as needed. 30 tablet 5   insulin  lispro (HUMALOG  KWIKPEN) 100 UNIT/ML KwikPen Inject 1-15 Units into the skin  3 (three) times daily. 43 mL 1   Insulin  Pen Needle 31G X 5 MM MISC 1 each by Does not apply route at bedtime. 30 each 5   Insulin  Pen Needle 32G X 4 MM MISC 1 Needle by Does not apply route 3 (three) times daily. 200 each 5   Lancet Devices (ACCU-CHEK SOFTCLIX) lancets Use as instructed daily. 1 each 5   Lancets (FREESTYLE) lancets Use to check blood sugar three times daily. E11.49 100 each 3   lidocaine  (LIDODERM ) 5 % Place 1 patch onto the skin daily. Remove & Discard patch within 12 hours or as directed by MD 30 patch 1   metFORMIN  (GLUCOPHAGE ) 500 MG tablet TAKE 2 TABLETS (1,000 MG  TOTAL) BY MOUTH 2 (TWO) TIMES DAILY WITH A MEAL. 360 tablet 1   omeprazole  (PRILOSEC) 40 MG capsule TAKE 1 CAPSULE BY MOUTH EVERY  MORNING 100 capsule 0   pregabalin  (LYRICA ) 100 MG capsule TAKE 1 CAPSULE BY MOUTH TWICE A DAY 60 capsule 3   saccharomyces boulardii (FLORASTOR) 250 MG capsule Take 1 capsule (250 mg total) by mouth 2 (two) times daily. 60 capsule 0   valACYclovir  (VALTREX ) 500 MG tablet TAKE 1 TABLET (500 MG TOTAL) BY MOUTH 2 (TWO) TIMES DAILY. FOR HERPES PROPHYLAXIS 180 tablet 1   azithromycin  (ZITHROMAX  Z-PAK) 250 MG tablet Take 2 tablets po load, then take 1 tablet po daily. 6 each 0   clotrimazole  (LOTRIMIN ) 1 % cream Apply 1 Application topically 2 (two) times daily. 60 g 2   moxifloxacin  (AVELOX ) 400 MG tablet Take 1 tablet (400 mg total) by mouth daily at 8 pm. 7 tablet 0   Semaglutide  (RYBELSUS ) 14 MG TABS Take 1 tablet (14 mg total) by mouth daily. (Patient not taking: Reported on 09/14/2024) 90 tablet 1   No current facility-administered medications for this visit.   Assessment & Plan    Hypertension Assessment: BP is uncontrolled in office BP 136/72 mmHg;  above the goal (<130/80). Tolerates carvedilol , reports it causes shoulder pain (unilateral), for which PCP prescribed lidocaine  patches Denies SOB, palpitation, chest pain, headaches,or swelling Reiterated the importance of regular  exercise and low salt diet   Plan:  Continue taking carvedilol  12.5 mg twice daily Patient to keep record of BP readings with heart rate and report to us  at the next visit Patient to follow up with neprhology - we will reach out in January to see if she needs further intervention.      Yuvin Bussiere PharmD CPP [facility]  [address] [place], [place] [address] [phone]

## 2024-09-14 NOTE — Progress Notes (Signed)
 Diagnosis: Iron  Deficiency Anemia  Provider:  Praveen Mannam MD  Procedure: IV Infusion  IV Type: Peripheral, IV Location: L Antecubital  Venofer  (Iron  Sucrose), Dose: 300 mg  Infusion Start Time: 1341  Infusion Stop Time: 1522  Post Infusion IV Care: Patient declined observation and Peripheral IV Discontinued  Discharge: Condition: Good, Destination: Home . AVS Declined  Performed by:  Maximiano JONELLE Pouch, LPN

## 2024-09-14 NOTE — Patient Instructions (Addendum)
 Reach out to me after seeing Nephrology, otherwise I'll check in with you in late January to see how your BP is doing.    Take your BP meds as follows:  CONTINUE WITH CARVEDILOL  12.5 MG TWICE DAILY  Check your blood pressure at home daily and keep record of the readings.  Your blood pressure goal is < 130/80  To check your pressure at home you will need to:  1. Sit up in a chair, with feet flat on the floor and back supported. Do not cross your ankles or legs. 2. Rest your left arm so that the cuff is about heart level. If the cuff goes on your upper arm,  then just relax the arm on the table, arm of the chair or your lap. If you have a wrist cuff, we  suggest relaxing your wrist against your chest (think of it as Pledging the Flag with the  wrong arm).  3. Place the cuff snugly around your arm, about 1 inch above the crook of your elbow. The  cords should be inside the groove of your elbow.  4. Sit quietly, with the cuff in place, for about 5 minutes. After that 5 minutes press the power  button to start a reading. 5. Do not talk or move while the reading is taking place.  6. Record your readings on a sheet of paper. Although most cuffs have a memory, it is often  easier to see a pattern developing when the numbers are all in front of you.  7. You can repeat the reading after 1-3 minutes if it is recommended  Make sure your bladder is empty and you have not had caffeine or tobacco within the last 30 min  Always bring your blood pressure log with you to your appointments. If you have not brought your monitor in to be double checked for accuracy, please bring it to your next appointment.  You can find a list of quality blood pressure cuffs at [email or web address]  Important lifestyle changes to control high blood pressure  Intervention  Effect on the BP  Lose extra pounds and watch your waistline Weight loss is one of the most effective lifestyle changes for controlling blood pressure. If  you're overweight or obese, losing even a small amount of weight can help reduce blood pressure. Blood pressure might go down by about 1 millimeter of mercury (mm Hg) with each kilogram (about 2.2 pounds) of weight lost.  Exercise regularly As a general goal, aim for at least 30 minutes of moderate physical activity every day. Regular physical activity can lower high blood pressure by about 5 to 8 mm Hg.  Eat a healthy diet Eating a diet rich in whole grains, fruits, vegetables, and low-fat dairy products and low in saturated fat and cholesterol. A healthy diet can lower high blood pressure by up to 11 mm Hg.  Reduce salt (sodium) in your diet Even a small reduction of sodium in the diet can improve heart health and reduce high blood pressure by about 5 to 6 mm Hg.  Limit alcohol One drink equals 12 ounces of beer, 5 ounces of wine, or 1.5 ounces of 80-proof liquor.  Limiting alcohol to less than one drink a day for women or two drinks a day for men can help lower blood pressure by about 4 mm Hg.   If you have any questions or concerns please use My Chart to send questions or call the office at [phone]

## 2024-09-15 ENCOUNTER — Other Ambulatory Visit: Payer: Self-pay | Admitting: Family Medicine

## 2024-09-15 ENCOUNTER — Ambulatory Visit (INDEPENDENT_AMBULATORY_CARE_PROVIDER_SITE_OTHER): Admitting: Obstetrics

## 2024-09-15 ENCOUNTER — Encounter: Payer: Self-pay | Admitting: Family Medicine

## 2024-09-15 ENCOUNTER — Encounter: Payer: Self-pay | Admitting: Obstetrics

## 2024-09-15 VITALS — BP 136/93 | HR 67 | Ht 64.0 in | Wt 161.0 lb

## 2024-09-15 DIAGNOSIS — B3731 Acute candidiasis of vulva and vagina: Secondary | ICD-10-CM

## 2024-09-15 DIAGNOSIS — R300 Dysuria: Secondary | ICD-10-CM | POA: Diagnosis not present

## 2024-09-15 DIAGNOSIS — M533 Sacrococcygeal disorders, not elsewhere classified: Secondary | ICD-10-CM

## 2024-09-15 DIAGNOSIS — Z01419 Encounter for gynecological examination (general) (routine) without abnormal findings: Secondary | ICD-10-CM

## 2024-09-15 DIAGNOSIS — Z2239 Carrier of other specified bacterial diseases: Secondary | ICD-10-CM

## 2024-09-15 MED ORDER — CLOTRIMAZOLE 1 % EX CREA: 1.0000 | TOPICAL_CREAM | Freq: Two times a day (BID) | CUTANEOUS | 2 refills | Status: AC

## 2024-09-15 MED ORDER — AZITHROMYCIN 250 MG PO TABS: ORAL_TABLET | ORAL | 0 refills | Status: AC

## 2024-09-15 MED ORDER — FLUCONAZOLE 150 MG PO TABS: 150.0000 mg | ORAL_TABLET | Freq: Once | ORAL | 0 refills | Status: AC

## 2024-09-15 MED ORDER — MOXIFLOXACIN HCL 400 MG PO TABS: 400.0000 mg | ORAL_TABLET | Freq: Every day | ORAL | 0 refills | Status: DC

## 2024-09-15 MED ORDER — DOXYCYCLINE HYCLATE 100 MG PO CAPS: 100.0000 mg | ORAL_CAPSULE | Freq: Two times a day (BID) | ORAL | 0 refills | Status: DC

## 2024-09-15 NOTE — Progress Notes (Signed)
 Patient ID: Amber Rose, female   DOB: 11-Feb-1976, 48 y.o.   MRN: [id number]  HPI Amber Rose is Ca 48 y.o. female.  Complains of burning and irritation with urination.  Also itching of vulva and groin. HPI  Past Medical History:  Diagnosis Date   Abnormal Pap smear of cervix    yrs ago   Adenomyosis    Allergy     Anemia    Anxiety    Aortic atherosclerosis    Arthritis    Asthma    Chronic kidney disease    Chronic UTI    Clostridium difficile infection    Depression    Diabetes mellitus without complication (HCC) 10/22/2023   A1C 8.8   Diverticulosis    Endometriosis    Fibroid    Fibromyalgia    GERD (gastroesophageal reflux disease)    Hiatal hernia    Hyperlipidemia    Hypertension    Internal hemorrhoids    Meningitis, viral    Migraines    Pancreatitis    Pure hypercholesterolemia 12/23/2020   Stroke (HCC)    Tubular adenoma of colon     Past Surgical History:  Procedure Laterality Date   APPENDECTOMY  1990   CERVICAL BIOPSY  W/ LOOP ELECTRODE EXCISION     LEEP   CESAREAN SECTION  2002   CHOLECYSTECTOMY  2011   COLONOSCOPY     UPPER GASTROINTESTINAL ENDOSCOPY     URINARY SURGERY     urethra sling, removal, and then revision 2016 (4 surgeries)   WISDOM TOOTH EXTRACTION      Family History  Problem Relation Age of Onset   Cancer Mother        cervical, breast, lung, skin, bladder-ex smoker   Hypertension Mother    Diabetes Father    Heart attack Maternal Grandfather    Multiple sclerosis Paternal Grandmother     Social History Social History   Tobacco Use   Smoking status: Former    Current packs/day: 0.10    Average packs/day: 0.1 packs/day for 20.0 years (2.0 ttl pk-yrs)    Types: Cigarettes   Smokeless tobacco: Never   Tobacco comments:    Patient is engaged in health coaching for smoking cessation as of 12/26/20. Patient stated that she has stopped smoking, drinking and any form of drug usage since her stroke.  Vaping Use    Vaping status: Never Used  Substance Use Topics   Alcohol use: Not Currently    Comment: occ   Drug use: Not Currently    Types: Marijuana    Comment: occ    Allergies  Allergen Reactions   Amlodipine      Leg pain, swelling around eyes    Atorvastatin  Other (See Comments)     Joint pain   Crestor  [Rosuvastatin ]     Joint pain    Interferon Beta-1a Other (See Comments)   Irbesartan      Leg pain, swelling around eyes   Metronidazole  Nausea And Vomiting   Penicillins     childhood   Xanax [Alprazolam]     Caused upper respiratory symptoms per pt   Glyburide Other (See Comments)    blood sugar dropped uncontrollably   Linagliptin Diarrhea and Other (See Comments)    Stomach pain, sinus infection   Moxifloxacin  Diarrhea    Current Outpatient Medications  Medication Sig Dispense Refill   albuterol  (PROAIR  HFA) 108 (90 Base) MCG/ACT inhaler Inhale 2 puffs into the lungs every 4 (four) hours as needed for wheezing or  shortness of breath. 3 Inhaler 0   aspirin  EC 81 MG tablet Take 1 tablet (81 mg total) by mouth daily. Swallow whole. 30 tablet 12   azithromycin  (ZITHROMAX  Z-PAK) 250 MG tablet Take 2 tablets po load, then take 1 tablet po daily. 6 each 0   baclofen  (LIORESAL ) 10 MG tablet Take 1 tablet (10 mg total) by mouth 3 (three) times daily as needed for muscle spasms. 90 each 11   Blood Glucose Monitoring Suppl (ACCU-CHEK AVIVA PLUS) w/Device KIT USE AS DIRECTED DAILY. E11.9 1 kit 0   buPROPion  (WELLBUTRIN  XL) 150 MG 24 hr tablet Take 1 tablet (150 mg total) by mouth daily. 90 tablet 1   carvedilol  (COREG ) 12.5 MG tablet Take 1 tablet (12.5 mg total) by mouth 2 (two) times daily. 180 tablet 3   clotrimazole  (LOTRIMIN ) 1 % cream Apply 1 Application topically 2 (two) times daily. 60 g 2   Continuous Glucose Receiver (FREESTYLE LIBRE 3 READER) DEVI 1 Device by Does not apply route continuous. 1 each 0   Continuous Glucose Sensor (FREESTYLE LIBRE 3 PLUS SENSOR) MISC Inject  1 Device into the skin continuous. Change every 15 days 6 each 3   EC-RX Testosterone  0.2 % CREA Place onto the skin as directed.     fluconazole  (DIFLUCAN ) 150 MG tablet Take 1 tablet (150 mg total) by mouth once for 1 dose. 1 tablet 0   Glucagon  (BAQSIMI  ONE PACK) 3 MG/DOSE POWD Place 1 Device into the nose as needed (Low blood sugar with impaired consciousness). 2 each 3   Glucagon  (GVOKE HYPOPEN  1-PACK) 1 MG/0.2ML SOAJ Inject 1 mg into the skin as needed (low blood sugar with impaired consciousness). 0.4 mL 2   glucose blood (FREESTYLE TEST STRIPS) test strip USE TO CHECK BLOOD SUGAR THREE TIMES DAILY. E11.49 100 strip 6   hydrocortisone  (ANUSOL -HC) 2.5 % rectal cream Place 1 Application rectally 2 (two) times daily. 30 g 1   hydrOXYzine  (ATARAX ) 50 MG tablet Take 1 tablet (50 mg total) by mouth 3 (three) times daily as needed. 30 tablet 5   insulin  lispro (HUMALOG  KWIKPEN) 100 UNIT/ML KwikPen Inject 1-15 Units into the skin 3 (three) times daily. 43 mL 1   Insulin  Pen Needle 31G X 5 MM MISC 1 each by Does not apply route at bedtime. 30 each 5   Insulin  Pen Needle 32G X 4 MM MISC 1 Needle by Does not apply route 3 (three) times daily. 200 each 5   Lancet Devices (ACCU-CHEK SOFTCLIX) lancets Use as instructed daily. 1 each 5   Lancets (FREESTYLE) lancets Use to check blood sugar three times daily. E11.49 100 each 3   lidocaine  (LIDODERM ) 5 % Place 1 patch onto the skin daily. Remove & Discard patch within 12 hours or as directed by MD 30 patch 1   metFORMIN  (GLUCOPHAGE ) 500 MG tablet TAKE 2 TABLETS (1,000 MG TOTAL) BY MOUTH 2 (TWO) TIMES DAILY WITH A MEAL. 360 tablet 1   moxifloxacin  (AVELOX ) 400 MG tablet Take 1 tablet (400 mg total) by mouth daily at 8 pm. 7 tablet 0   omeprazole  (PRILOSEC) 40 MG capsule TAKE 1 CAPSULE BY MOUTH EVERY  MORNING 100 capsule 0   pregabalin  (LYRICA ) 100 MG capsule TAKE 1 CAPSULE BY MOUTH TWICE A DAY 60 capsule 3   saccharomyces boulardii (FLORASTOR) 250 MG capsule  Take 1 capsule (250 mg total) by mouth 2 (two) times daily. 60 capsule 0   valACYclovir  (VALTREX ) 500 MG tablet TAKE 1  TABLET (500 MG TOTAL) BY MOUTH 2 (TWO) TIMES DAILY. FOR HERPES PROPHYLAXIS 180 tablet 1   cholestyramine  (QUESTRAN ) 4 g packet Take 1 packet (4 g total) by mouth daily. (Patient taking differently: Take 4 g by mouth daily as needed.) 30 packet 5   Semaglutide  (RYBELSUS ) 14 MG TABS Take 1 tablet (14 mg total) by mouth daily. (Patient not taking: Reported on 09/14/2024) 90 tablet 1   No current facility-administered medications for this visit.    Review of Systems Review of Systems Constitutional: negative for fatigue and weight loss Respiratory: negative for cough and wheezing Cardiovascular: negative for chest pain, fatigue and palpitations Gastrointestinal: negative for abdominal pain and change in bowel habits Genitourinary: positive for burning, irritation and odor with urination Integument/breast: negative for nipple discharge Musculoskeletal:negative for myalgias Neurological: negative for gait problems and tremors Behavioral/Psych: negative for abusive relationship, depression Endocrine: negative for temperature intolerance      Blood pressure (!) 136/93, pulse 67, height 5' 4 (1.626 m), weight 161 lb (73 kg), last menstrual period 09/08/2024.  Physical Exam Physical Exam General:   Alert and no distress  Skin:   no rash or abnormalities  Lungs:   clear to auscultation bilaterally  Heart:   regular rate and rhythm, S1, S2 normal, no murmur, click, rub or gallop  Breasts:   Not examined  Abdomen:  normal findings: no organomegaly, soft, non-tender and no hernia  Pelvis:  External genitalia: normal general appearance Urinary system: urethral meatus normal and bladder without fullness, nontender Vaginal: normal without tenderness, induration or masses Cervix: normal appearance Adnexa: normal bimanual exam Uterus: anteverted and non-tender, normal size    I  have spent a total of 20 minutes of face-to-face time, excluding clinical staff time, reviewing notes and preparing to see patient, ordering tests and/or medications, and counseling the patient.   Data Reviewed Wet Prep Cultures  Assessment     1. 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  Dispense: 7 tablet; Refill: 0 - azithromycin  (ZITHROMAX  Z-PAK) 250 MG tablet; Take 2 tablets po load, then take 1 tablet po daily.  Dispense: 6 each; Refill: 0  3. Candida vaginitis Rx: - fluconazole  (DIFLUCAN ) 150 MG tablet; Take 1 tablet (150 mg total) by mouth once for 1 dose.  Dispense: 1 tablet; Refill: 0  4. Candidal vulvitis Rx: - clotrimazole  (LOTRIMIN ) 1 % cream; Apply 1 Application topically 2 (two) times daily.  Dispense: 60 g; Refill: 2  5. Dysuria Rx: - Mycoplasma / ureaplasma culture    Orders Placed This Encounter  Procedures   Urine Culture   Mycoplasma / ureaplasma culture   Meds ordered this encounter  Medications   clotrimazole  (LOTRIMIN ) 1 % cream    Sig: Apply 1 Application topically 2 (two) times daily.    Dispense:  60 g    Refill:  2   DISCONTD: doxycycline  (VIBRAMYCIN ) 100 MG capsule    Sig: Take 1 capsule (100 mg total) by mouth 2 (two) times daily.    Dispense:  14 capsule    Refill:  0   fluconazole  (DIFLUCAN ) 150 MG tablet    Sig: Take 1 tablet (150 mg total) by mouth once for 1 dose.    Dispense:  1 tablet    Refill:  0   moxifloxacin  (AVELOX ) 400 MG tablet  Sig: Take 1 tablet (400 mg total) by mouth daily at 8 pm.    Dispense:  7 tablet    Refill:  0   azithromycin  (ZITHROMAX  Z-PAK) 250 MG tablet    Sig: Take 2 tablets po load, then take 1 tablet po daily.    Dispense:  6 each    Refill:  0    CARLIN RONAL CENTERS, MD, FACOG Attending Obstetrician &  Gynecologist, [facility] for [facility], [facility], [place] 09/15/2024

## 2024-09-15 NOTE — Progress Notes (Signed)
 Pt presents for Mycoplasma. Pt has questions that she would like to discuss with Dr. Rudy.

## 2024-09-17 ENCOUNTER — Other Ambulatory Visit: Payer: Self-pay | Admitting: Obstetrics

## 2024-09-17 LAB — URINE CULTURE

## 2024-09-18 ENCOUNTER — Encounter (HOSPITAL_BASED_OUTPATIENT_CLINIC_OR_DEPARTMENT_OTHER): Payer: Self-pay | Admitting: Pharmacist Clinician (PhC)/ Clinical Pharmacy Specialist

## 2024-09-18 NOTE — Assessment & Plan Note (Signed)
 Assessment: BP is uncontrolled in office BP 136/72 mmHg;  above the goal (<130/80). Tolerates carvedilol , reports it causes shoulder pain (unilateral), for which PCP prescribed lidocaine  patches Denies SOB, palpitation, chest pain, headaches,or swelling Reiterated the importance of regular exercise and low salt diet   Plan:  Continue taking carvedilol  12.5 mg twice daily Patient to keep record of BP readings with heart rate and report to us  at the next visit Patient to follow up with neprhology - we will reach out in January to see if she needs further intervention.

## 2024-09-22 LAB — MYCOPLASMA / UREAPLASMA CULTURE
Mycoplasma hominis Culture: NEGATIVE
Ureaplasma urealyticum: NEGATIVE

## 2024-09-27 ENCOUNTER — Ambulatory Visit: Payer: Self-pay

## 2024-09-27 ENCOUNTER — Encounter: Payer: Self-pay | Admitting: Family Medicine

## 2024-09-27 ENCOUNTER — Other Ambulatory Visit (HOSPITAL_COMMUNITY)
Admission: RE | Admit: 2024-09-27 | Discharge: 2024-09-27 | Disposition: A | Discharge status: Home / Self Care | Source: Ambulatory Visit | Attending: Family Medicine | Admitting: Family Medicine

## 2024-09-27 ENCOUNTER — Ambulatory Visit: Attending: Family Medicine | Admitting: Family Medicine

## 2024-09-27 VITALS — BP 158/82 | HR 66 | Temp 98.6°F | Ht 64.0 in | Wt 164.6 lb

## 2024-09-27 DIAGNOSIS — Z111 Encounter for screening for respiratory tuberculosis: Secondary | ICD-10-CM

## 2024-09-27 DIAGNOSIS — Z113 Encounter for screening for infections with a predominantly sexual mode of transmission: Secondary | ICD-10-CM | POA: Diagnosis present

## 2024-09-27 DIAGNOSIS — I69351 Hemiplegia and hemiparesis following cerebral infarction affecting right dominant side: Secondary | ICD-10-CM

## 2024-09-27 DIAGNOSIS — Z7984 Long term (current) use of oral hypoglycemic drugs: Secondary | ICD-10-CM

## 2024-09-27 DIAGNOSIS — M533 Sacrococcygeal disorders, not elsewhere classified: Secondary | ICD-10-CM

## 2024-09-27 DIAGNOSIS — E1149 Type 2 diabetes mellitus with other diabetic neurological complication: Secondary | ICD-10-CM | POA: Diagnosis not present

## 2024-09-27 DIAGNOSIS — E1159 Type 2 diabetes mellitus with other circulatory complications: Secondary | ICD-10-CM

## 2024-09-27 DIAGNOSIS — I152 Hypertension secondary to endocrine disorders: Secondary | ICD-10-CM

## 2024-09-27 DIAGNOSIS — R059 Cough, unspecified: Secondary | ICD-10-CM

## 2024-09-27 DIAGNOSIS — R399 Unspecified symptoms and signs involving the genitourinary system: Secondary | ICD-10-CM

## 2024-09-27 DIAGNOSIS — R102 Pelvic and perineal pain unspecified side: Secondary | ICD-10-CM | POA: Diagnosis not present

## 2024-09-27 LAB — POCT URINALYSIS DIP (CLINITEK)
Bilirubin, UA: NEGATIVE
Glucose, UA: NEGATIVE mg/dL
Ketones, POC UA: NEGATIVE mg/dL
Leukocytes, UA: NEGATIVE
Nitrite, UA: NEGATIVE
POC PROTEIN,UA: NEGATIVE
Spec Grav, UA: >=1.03 — AB (ref 1.010–1.025)
Urobilinogen, UA: 0.2 U/dL
pH, UA: 5.5 (ref 5.0–8.0)

## 2024-09-27 NOTE — Progress Notes (Signed)
 Subjective:  Patient ID: Amber Rose, female    DOB: April 20, 1976  Age: 48 y.o. MRN: [id number]  CC: Medical Management of Chronic Issues (Tailbone pain X3 months )     Discussed the use of AI scribe software for clinical note transcription with the patient, who gave verbal consent to proceed.  History of Present Illness Amber Rose is a 48 year old female with a history of type 2 diabetes mellitus , hypertension, depression and anxiety, hydradenitis, tobacco abuse, fibromyalgia, left thalamic infarct in 12/2023  who presents with persistent muscle spasms and concerns about ureaplasma infection. She is concerned about ongoing genitourinary infection. She had two years of odor, discharge, and soreness and tested positive for ureaplasma before October 21. She completed a 7 day course of doxycycline  three weeks ago. The extreme odor resolved, but she still has some discharge, itching, and irritation and is worried about other infections.  She has increased urinary frequency and a sensation of heat with urination without burning. She also has intermittent sharp urethral pain that is not limited to urination  She has persistent right-sided muscle spasms since her stroke. Symptoms are described as tightness and are worse in cold weather.She notes some stumbling but no falls and monitors her sodium intake.  .She has had tailbone pain for three months, worse when sitting or touching the area.  Denies prior trauma.  Pain intensity varies. She has not tried pain medications or topical treatments.  She has diabetes followed by endocrinology and is taking metformin . She has hypertension and takes carvedilol , which she tolerates except for persistent cough and wheezing since starting it.  Hypertension is managed by the advanced hypertensive clinic with cardiology and she has had intolerance to multiple antihypertensives.    She is also requesting a preemployment physical form to be completed  for application to [facility].  Past Medical History:  Diagnosis Date   Abnormal Pap smear of cervix    yrs ago   Adenomyosis    Allergy     Anemia    Anxiety    Aortic atherosclerosis    Arthritis    Asthma    Chronic kidney disease    Chronic UTI    Clostridium difficile infection    Depression    Diabetes mellitus without complication (HCC) 10/22/2023   A1C 8.8   Diverticulosis    Endometriosis    Fibroid    Fibromyalgia    GERD (gastroesophageal reflux disease)    Hiatal hernia    Hyperlipidemia    Hypertension    Internal hemorrhoids    Meningitis, viral    Migraines    Pancreatitis    Pure hypercholesterolemia 12/23/2020   Stroke (HCC)    Tubular adenoma of colon     Past Surgical History:  Procedure Laterality Date   APPENDECTOMY  1990   CERVICAL BIOPSY  W/ LOOP ELECTRODE EXCISION     LEEP   CESAREAN SECTION  2002   CHOLECYSTECTOMY  2011   COLONOSCOPY     UPPER GASTROINTESTINAL ENDOSCOPY     URINARY SURGERY     urethra sling, removal, and then revision 2016 (4 surgeries)   WISDOM TOOTH EXTRACTION      Family History  Problem Relation Age of Onset   Cancer Mother        cervical, breast, lung, skin, bladder-ex smoker   Hypertension Mother    Diabetes Father    Heart attack Maternal Grandfather    Multiple sclerosis Paternal Grandmother  Social History   Socioeconomic History   Marital status: Single    Spouse name: Not on file   Number of children: 3   Years of education: Not on file   Highest education level: Not on file  Occupational History   Not on file  Tobacco Use   Smoking status: Former    Current packs/day: 0.10    Average packs/day: 0.1 packs/day for 20.0 years (2.0 ttl pk-yrs)    Types: Cigarettes   Smokeless tobacco: Never   Tobacco comments:    Patient is engaged in health coaching for smoking cessation as of 12/26/20. Patient stated that she has stopped smoking, drinking and any form of drug usage since her  stroke.  Vaping Use   Vaping status: Never Used  Substance and Sexual Activity   Alcohol use: Not Currently    Comment: occ   Drug use: Not Currently    Types: Marijuana    Comment: occ   Sexual activity: Yes    Partners: Male    Birth control/protection: Surgical    Comment: BTL  Other Topics Concern   Not on file  Social History Narrative   Lives home with adult niece.  Not working.  Disability pending.  Pt is single.  Education GED.  3 children.    Social Drivers of Corporate Investment Banker Strain: Low Risk  (01/25/2024)   Overall Financial Resource Strain (CARDIA)    Difficulty of Paying Living Expenses: Not very hard  Food Insecurity: Food Insecurity Present (01/25/2024)   Hunger Vital Sign    Worried About Running Out of Food in the Last Year: Sometimes true    Ran Out of Food in the Last Year: Sometimes true  Transportation Needs: No Transportation Needs (01/25/2024)   PRAPARE - Administrator, Civil Service (Medical): No    Lack of Transportation (Non-Medical): No  Physical Activity: Insufficiently Active (01/25/2024)   Exercise Vital Sign    Days of Exercise per Week: 3 days    Minutes of Exercise per Session: 30 min  Stress: Stress Concern Present (01/25/2024)   [employer] of Occupational Health - Occupational Stress Questionnaire    Feeling of Stress : Rather much  Social Connections: Socially Isolated (01/25/2024)   Social Connection and Isolation Panel    Frequency of Communication with Friends and Family: Three times a week    Frequency of Social Gatherings with Friends and Family: Once a week    Attends Religious Services: Never    Database Administrator or Organizations: No    Attends Engineer, Structural: Never    Marital Status: Never married    Allergies  Allergen Reactions   Amlodipine      Leg pain, swelling around eyes    Atorvastatin  Other (See Comments)     Joint pain   Crestor  [Rosuvastatin ]     Joint pain     Interferon Beta-1a Other (See Comments)   Irbesartan      Leg pain, swelling around eyes   Metronidazole  Nausea And Vomiting   Penicillins     childhood   Xanax [Alprazolam]     Caused upper respiratory symptoms per pt   Glyburide Other (See Comments)    blood sugar dropped uncontrollably   Linagliptin Diarrhea and Other (See Comments)    Stomach pain, sinus infection   Moxifloxacin  Diarrhea    Outpatient Medications Prior to Visit  Medication Sig Dispense Refill   albuterol  (PROAIR  HFA) 108 (90 Base) MCG/ACT inhaler  Inhale 2 puffs into the lungs every 4 (four) hours as needed for wheezing or shortness of breath. 3 Inhaler 0   aspirin  EC 81 MG tablet Take 1 tablet (81 mg total) by mouth daily. Swallow whole. 30 tablet 12   baclofen  (LIORESAL ) 10 MG tablet Take 1 tablet (10 mg total) by mouth 3 (three) times daily as needed for muscle spasms. 90 each 11   Blood Glucose Monitoring Suppl (ACCU-CHEK AVIVA PLUS) w/Device KIT USE AS DIRECTED DAILY. E11.9 1 kit 0   buPROPion  (WELLBUTRIN  XL) 150 MG 24 hr tablet Take 1 tablet (150 mg total) by mouth daily. 90 tablet 1   carvedilol  (COREG ) 12.5 MG tablet Take 1 tablet (12.5 mg total) by mouth 2 (two) times daily. 180 tablet 3   clotrimazole  (LOTRIMIN ) 1 % cream Apply 1 Application topically 2 (two) times daily. 60 g 2   Continuous Glucose Receiver (FREESTYLE LIBRE 3 READER) DEVI 1 Device by Does not apply route continuous. 1 each 0   Continuous Glucose Sensor (FREESTYLE LIBRE 3 PLUS SENSOR) MISC Inject 1 Device into the skin continuous. Change every 15 days 6 each 3   EC-RX Testosterone  0.2 % CREA Place onto the skin as directed.     Glucagon  (BAQSIMI  ONE PACK) 3 MG/DOSE POWD Place 1 Device into the nose as needed (Low blood sugar with impaired consciousness). 2 each 3   Glucagon  (GVOKE HYPOPEN  1-PACK) 1 MG/0.2ML SOAJ Inject 1 mg into the skin as needed (low blood sugar with impaired consciousness). 0.4 mL 2   glucose blood (FREESTYLE TEST  STRIPS) test strip USE TO CHECK BLOOD SUGAR THREE TIMES DAILY. E11.49 100 strip 6   hydrocortisone  (ANUSOL -HC) 2.5 % rectal cream Place 1 Application rectally 2 (two) times daily. 30 g 1   hydrOXYzine  (ATARAX ) 50 MG tablet Take 1 tablet (50 mg total) by mouth 3 (three) times daily as needed. 30 tablet 5   insulin  lispro (HUMALOG  KWIKPEN) 100 UNIT/ML KwikPen Inject 1-15 Units into the skin 3 (three) times daily. 43 mL 1   Insulin  Pen Needle 31G X 5 MM MISC 1 each by Does not apply route at bedtime. 30 each 5   Insulin  Pen Needle 32G X 4 MM MISC 1 Needle by Does not apply route 3 (three) times daily. 200 each 5   Lancet Devices (ACCU-CHEK SOFTCLIX) lancets Use as instructed daily. 1 each 5   Lancets (FREESTYLE) lancets Use to check blood sugar three times daily. E11.49 100 each 3   lidocaine  (LIDODERM ) 5 % Place 1 patch onto the skin daily. Remove & Discard patch within 12 hours or as directed by MD 30 patch 1   metFORMIN  (GLUCOPHAGE ) 500 MG tablet TAKE 2 TABLETS (1,000 MG TOTAL) BY MOUTH 2 (TWO) TIMES DAILY WITH A MEAL. 360 tablet 1   omeprazole  (PRILOSEC) 40 MG capsule TAKE 1 CAPSULE BY MOUTH EVERY  MORNING 100 capsule 0   pregabalin  (LYRICA ) 100 MG capsule TAKE 1 CAPSULE BY MOUTH TWICE A DAY 60 capsule 3   saccharomyces boulardii (FLORASTOR) 250 MG capsule Take 1 capsule (250 mg total) by mouth 2 (two) times daily. 60 capsule 0   Semaglutide  (RYBELSUS ) 14 MG TABS Take 1 tablet (14 mg total) by mouth daily. 90 tablet 1   valACYclovir  (VALTREX ) 500 MG tablet TAKE 1 TABLET (500 MG TOTAL) BY MOUTH 2 (TWO) TIMES DAILY. FOR HERPES PROPHYLAXIS 180 tablet 1   azithromycin  (ZITHROMAX  Z-PAK) 250 MG tablet Take 2 tablets po load, then take 1 tablet  po daily. (Patient not taking: Reported on 09/27/2024) 6 each 0   cholestyramine  (QUESTRAN ) 4 g packet Take 1 packet (4 g total) by mouth daily. (Patient taking differently: Take 4 g by mouth daily as needed.) 30 packet 5   moxifloxacin  (AVELOX ) 400 MG tablet Take 1  tablet (400 mg total) by mouth daily at 8 pm. (Patient not taking: Reported on 09/27/2024) 7 tablet 0   No facility-administered medications prior to visit.     ROS Review of Systems  Constitutional:  Negative for activity change and appetite change.  HENT:  Negative for sinus pressure and sore throat.   Respiratory:  Positive for cough and wheezing. Negative for chest tightness and shortness of breath.   Cardiovascular:  Negative for chest pain and palpitations.  Gastrointestinal:  Negative for abdominal distention, abdominal pain and constipation.  Genitourinary:  Positive for vaginal discharge.  Musculoskeletal: Negative.   Neurological:  Positive for weakness.  Psychiatric/Behavioral:  Negative for behavioral problems and dysphoric mood.     Objective:  BP (!) 158/82   Pulse 66   Temp 98.6 F (37 C) (Oral)   Ht 5' 4 (1.626 m)   Wt 164 lb 9.6 oz (74.7 kg)   LMP 09/08/2024 (Exact Date)   SpO2 99%   BMI 28.25 kg/m      09/27/2024    3:00 PM 09/15/2024    8:41 AM 09/15/2024    8:37 AM  BP/Weight  Systolic BP 158 136 141  Diastolic BP 82 93 92  Wt. (Lbs) 164.6  161  BMI 28.25 kg/m2  27.64 kg/m2      Physical Exam Constitutional:      Appearance: She is well-developed.  Cardiovascular:     Rate and Rhythm: Normal rate.     Heart sounds: Normal heart sounds. No murmur heard. Pulmonary:     Effort: Pulmonary effort is normal.     Breath sounds: Normal breath sounds. No wheezing or rales.  Chest:     Chest wall: No tenderness.  Abdominal:     General: Bowel sounds are normal. There is no distension.     Palpations: Abdomen is soft. There is no mass.     Tenderness: There is no abdominal tenderness.  Musculoskeletal:        General: Normal range of motion.     Right lower leg: No edema.     Left lower leg: No edema.     Comments: Slightly prominent coccyx with TTP  Neurological:     Mental Status: She is alert and oriented to person, place, and time.      Comments: Right sided weakness  Psychiatric:        Mood and Affect: Mood normal.        Latest Ref Rng & Units 08/10/2024    9:13 AM 07/26/2024   12:52 PM 07/26/2024   11:40 AM  CMP  Glucose 70 - 99 mg/dL 892  868  90   BUN 6 - 20 mg/dL 15  18  17    Creatinine 0.44 - 1.00 mg/dL 9.19  9.13  9.19   Sodium 135 - 145 mmol/L 139  137  133   Potassium 3.5 - 5.1 mmol/L 4.1  4.0  4.1   Chloride 98 - 111 mmol/L 109  102  101   CO2 22 - 32 mmol/L 27  22  23    Calcium  8.9 - 10.3 mg/dL 8.9  9.6  9.2   Total Protein 6.5 - 8.1 g/dL 6.2  6.9  7.2   Total Bilirubin 0.0 - 1.2 mg/dL 0.2  0.4  0.3   Alkaline Phos 38 - 126 U/L 60  79  63   AST 15 - 41 U/L 17  20  18    ALT 0 - 44 U/L 21  17  17      Lipid Panel     Component Value Date/Time   CHOL 251 (H) 05/25/2024 1035   TRIG 130 05/25/2024 1035   HDL 51 05/25/2024 1035   CHOLHDL 4.9 (H) 05/25/2024 1035   CHOLHDL 10.0 01/08/2024 0730   VLDL 33 01/08/2024 0730   LDLCALC 177 (H) 05/25/2024 1035   LDLCALC 184 (H) 11/22/2023 0950   LDLDIRECT 51 08/14/2022 1431    CBC    Component Value Date/Time   WBC 9.4 08/10/2024 0913   WBC 11.3 (H) 07/26/2024 1252   RBC 3.53 (L) 08/10/2024 0913   HGB 11.3 (L) 08/10/2024 0913   HGB 15.0 01/20/2024 1630   HCT 33.0 (L) 08/10/2024 0913   HCT 44.9 01/20/2024 1630   PLT 345 08/10/2024 0913   PLT 330 01/20/2024 1630   MCV 93.5 08/10/2024 0913   MCV 95 01/20/2024 1630   MCH 32.0 08/10/2024 0913   MCHC 34.2 08/10/2024 0913   RDW 14.5 08/10/2024 0913   RDW 12.7 01/20/2024 1630   LYMPHSABS 1.6 08/10/2024 0913   LYMPHSABS 2.5 01/20/2024 1630   MONOABS 0.6 08/10/2024 0913   EOSABS 0.2 08/10/2024 0913   EOSABS 0.1 01/20/2024 1630   BASOSABS 0.1 08/10/2024 0913   BASOSABS 0.1 01/20/2024 1630    Lab Results  Component Value Date   HGBA1C 5.2 08/22/2024       Assessment & Plan Genitourinary symptoms with history of ureaplasma infection/screening for STDs Persistent symptoms despite doxycycline   treatment.  Initial swab was positive but repeat performed on 11/21 by GYN was negative - Ordered ureaplasma swab test per patient request - Checked urine for infection -UA negative for UTI - Cervicovaginal ancillary testing sent off  Coccyx pain Chronic pain exacerbated by sitting and palpation. Possible anatomical abnormality. - Ordered x-ray of sacrum and coccyx. - Recommended Voltaren gel for topical pain relief.  Type 2 diabetes mellitus Last A1c was 5.2 Discontinued Ozempic  due to inaccurate glucose readings. Increased appetite and weight gain noted. Managed with metformin . - Follow up with endocrinologist for diabetes management.  Hypertension associated with type 2 diabetes mellitus Uncontrolled History of intolerance to multiple antihypertensives in the past Managed with carvedilol . Persistent cough and wheezing possibly related to medication, lung exam is clear. Blood pressure improved but remains elevated. CT angiogram showed prominent right hilar lymph node thought to be reactive from 07/2024 - Continue carvedilol  for blood pressure management. - Consider allergy  medications like Zyrtec  for cough management.  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 Muscle spasms noted, particularly in colder weather. -Secondary risk factor modification is imperative including adequate glycemic control, optimization of blood pressure and maintaining LDL less than 70.  Unfortunately she has declined taking statins and PCSK9 inhibitors - Continue current management and monitoring of symptoms.   Cough Patient attributes this to carvedilol  use She does have some postnasal drip and it is possible there is a sinus etiology - Advised to use antihistamines OTC   General Health Maintenance Encounter for PPD placement-discussion of immunizations and TB testing for employment requirements. Recent TB  blood panel performed. - Checked immunization records for tetanus, hepatitis B, and  MMR.      No orders of the defined types were placed in this encounter.   Follow-up: Return in about 6 months (around 03/28/2025).       Corrina Sabin, MD, FAAFP. [facility] [place], [place] [phone]   09/27/2024, 3:12 PM

## 2024-09-27 NOTE — Telephone Encounter (Signed)
 FYI Only or Action Required?: FYI only for provider: appointment scheduled on 09/27/24.  Patient was last seen in primary care on 07/06/2024 by Delbert Clam, MD.  Called Nurse Triage reporting Tailbone Pain.  Symptoms began several months ago.  Interventions attempted: OTC medications: Tylenol  and Rest, hydration, or home remedies.  Symptoms are: unchanged.  Triage Disposition: See PCP When Office is Open (Within 3 Days)  Patient/caregiver understands and will follow disposition?: Yes      Copied from CRM #[id number]. Topic: Clinical - Red Word Triage >> Sep 27, 2024  9:01 AM Amber Rose wrote: [facility] that prompted transfer to Nurse Triage: Patient is having pain in her lower part of her tailbone. Been ongoing for a couple months but the pain has gotten worse over time. Reason for Disposition  [1] MODERATE back pain (e.g., interferes with normal activities) AND [2] present > 3 days  Answer Assessment - Initial Assessment Questions 1. ONSET: When did the pain begin? (e.g., minutes, hours, days)     X 2 months, not worsening but not going away 2. LOCATION: Where does it hurt? (upper, mid or lower back)     Tailbone at top of buttocks, feels like a lump like the bone is protruding 3. SEVERITY: How bad is the pain?  (e.g., Scale 1-10; mild, moderate, or severe)     7/10, intemittent, sometimes sharp 4. PATTERN: Is the pain constant? (e.g., yes, no; constant, intermittent)      Intermittent, primarily notices it with touch/sitting 5. RADIATION: Does the pain shoot into your legs or somewhere else?     None 6. CAUSE:  What do you think is causing the back pain?      Unknown 7. BACK OVERUSE:  Any recent lifting of heavy objects, strenuous work or exercise?     None 8. MEDICINES: What have you taken so far for the pain? (e.g., nothing, acetaminophen , NSAIDS)     None 9. NEUROLOGIC SYMPTOMS: Do you have any weakness, numbness, or problems with bowel/bladder  control?     Pt notes having to get to the bathroom quicker for BM 10. OTHER SYMPTOMS: Do you have any other symptoms? (e.g., fever, abdomen pain, burning with urination, blood in urine)       Pt notes she has some dysuria but believes it is related to an issue GYN is treating her for  Protocols used: Back Pain-A-AH

## 2024-09-27 NOTE — Patient Instructions (Signed)
 VISIT SUMMARY:  During today's visit, we discussed your persistent muscle spasms, concerns about a possible ongoing genitourinary infection, increased urinary frequency, tailbone pain, diabetes management, hypertension, and general health maintenance. We reviewed your symptoms and made plans for further testing and treatment adjustments.  YOUR PLAN:  -GENITOURINARY SYMPTOMS WITH HISTORY OF UREAPLASMA INFECTION: You have ongoing symptoms despite completing a course of doxycycline . We have ordered a ureaplasma swab test and checked your urine for infection to determine if there is an ongoing issue.  -COCCYX PAIN: You have chronic tailbone pain that worsens when sitting or touching the area. We have ordered an x-ray of your sacrum and coccyx and recommended using Voltaren gel for topical pain relief.  -TYPE 2 DIABETES MELLITUS: Your diabetes is currently managed with metformin . We have discontinued Ozempic  due to inaccurate glucose readings and noted increased appetite and weight gain. Please follow up with your endocrinologist for further diabetes management.  -HYPERTENSION: Your blood pressure is managed with carvedilol , but you have a persistent cough and wheezing that may be related to the medication. We suggest continuing carvedilol  and considering allergy  medications like Zyrtec  to manage the cough.  -HEMIPARESIS FOLLOWING CEREBROVASCULAR ACCIDENT: You have chronic muscle weakness on your left side and muscle spasms, especially in colder weather. We will continue with your current management and monitor your symptoms.  -GENERAL HEALTH MAINTENANCE: We discussed your immunizations and TB testing for employment requirements. We checked your immunization records for tetanus, hepatitis B, and MMR.  INSTRUCTIONS:  Please follow up with your endocrinologist for diabetes management. We will contact you with the results of the ureaplasma swab test and urine check. Use Voltaren gel as directed for  tailbone pain. Consider taking Zyrtec  for your cough. Continue monitoring your symptoms and keep us  updated on any changes.

## 2024-09-28 ENCOUNTER — Ambulatory Visit
Admission: RE | Admit: 2024-09-28 | Discharge: 2024-09-28 | Disposition: A | Source: Ambulatory Visit | Attending: Family Medicine | Admitting: Family Medicine

## 2024-09-28 DIAGNOSIS — M533 Sacrococcygeal disorders, not elsewhere classified: Secondary | ICD-10-CM

## 2024-09-28 LAB — CERVICOVAGINAL ANCILLARY ONLY
Bacterial Vaginitis (gardnerella): NEGATIVE
Candida Glabrata: NEGATIVE
Candida Vaginitis: NEGATIVE
Chlamydia: NEGATIVE
Comment: NEGATIVE
Comment: NEGATIVE
Comment: NEGATIVE
Comment: NEGATIVE
Comment: NEGATIVE
Comment: NORMAL
Neisseria Gonorrhea: NEGATIVE
Trichomonas: NEGATIVE

## 2024-09-29 ENCOUNTER — Ambulatory Visit: Attending: Family Medicine

## 2024-09-29 LAB — TB SKIN TEST
Induration: 0 mm
TB Skin Test: NEGATIVE

## 2024-09-29 NOTE — Progress Notes (Signed)
PPD Reading Note  PPD read and results entered in EpicCare.  Result: 0 mm induration.  Interpretation: NEGATIVE

## 2024-09-30 LAB — GENITAL MYCOPLASMAS NAA, SWAB
Mycoplasma genitalium NAA: NEGATIVE
Mycoplasma hominis NAA: NEGATIVE
Ureaplasma spp NAA: NEGATIVE

## 2024-10-02 ENCOUNTER — Ambulatory Visit: Payer: Self-pay | Admitting: Family Medicine

## 2024-10-14 ENCOUNTER — Other Ambulatory Visit: Payer: Self-pay | Admitting: Family Medicine

## 2024-10-16 ENCOUNTER — Ambulatory Visit: Admitting: "Endocrinology

## 2024-10-17 ENCOUNTER — Other Ambulatory Visit (HOSPITAL_COMMUNITY)
Admission: RE | Admit: 2024-10-17 | Discharge: 2024-10-17 | Disposition: A | Discharge status: Home / Self Care | Source: Ambulatory Visit | Attending: Obstetrics | Admitting: Obstetrics

## 2024-10-17 ENCOUNTER — Encounter: Payer: Self-pay | Admitting: Obstetrics

## 2024-10-17 ENCOUNTER — Ambulatory Visit: Admitting: Obstetrics

## 2024-10-17 VITALS — BP 122/81 | HR 69 | Ht 63.58 in | Wt 159.6 lb

## 2024-10-17 DIAGNOSIS — Z9889 Other specified postprocedural states: Secondary | ICD-10-CM

## 2024-10-17 DIAGNOSIS — I639 Cerebral infarction, unspecified: Secondary | ICD-10-CM | POA: Diagnosis not present

## 2024-10-17 DIAGNOSIS — Z720 Tobacco use: Secondary | ICD-10-CM

## 2024-10-17 DIAGNOSIS — Z87898 Personal history of other specified conditions: Secondary | ICD-10-CM | POA: Diagnosis not present

## 2024-10-17 DIAGNOSIS — R351 Nocturia: Secondary | ICD-10-CM | POA: Diagnosis not present

## 2024-10-17 DIAGNOSIS — N941 Unspecified dyspareunia: Secondary | ICD-10-CM

## 2024-10-17 DIAGNOSIS — N939 Abnormal uterine and vaginal bleeding, unspecified: Secondary | ICD-10-CM | POA: Diagnosis not present

## 2024-10-17 DIAGNOSIS — R82998 Other abnormal findings in urine: Secondary | ICD-10-CM | POA: Insufficient documentation

## 2024-10-17 DIAGNOSIS — R319 Hematuria, unspecified: Secondary | ICD-10-CM | POA: Insufficient documentation

## 2024-10-17 DIAGNOSIS — N3946 Mixed incontinence: Secondary | ICD-10-CM

## 2024-10-17 DIAGNOSIS — B3731 Acute candidiasis of vulva and vagina: Secondary | ICD-10-CM

## 2024-10-17 DIAGNOSIS — R102 Pelvic and perineal pain unspecified side: Secondary | ICD-10-CM | POA: Insufficient documentation

## 2024-10-17 DIAGNOSIS — R159 Full incontinence of feces: Secondary | ICD-10-CM | POA: Diagnosis not present

## 2024-10-17 LAB — CERVICOVAGINAL ANCILLARY ONLY
Bacterial Vaginitis (gardnerella): NEGATIVE
Candida Glabrata: NEGATIVE
Candida Vaginitis: NEGATIVE
Chlamydia: NEGATIVE
Comment: NEGATIVE
Comment: NEGATIVE
Comment: NEGATIVE
Comment: NEGATIVE
Comment: NORMAL
Neisseria Gonorrhea: NEGATIVE

## 2024-10-17 LAB — URINALYSIS, COMPLETE (UACMP) WITH MICROSCOPIC
Bacteria, UA: NONE SEEN
Bilirubin Urine: NEGATIVE
Glucose, UA: NEGATIVE mg/dL
Hgb urine dipstick: NEGATIVE
Ketones, ur: NEGATIVE mg/dL
Leukocytes,Ua: NEGATIVE
Nitrite: NEGATIVE
Protein, ur: NEGATIVE mg/dL
Specific Gravity, Urine: 1.02 (ref 1.005–1.030)
pH: 5 (ref 5.0–8.0)

## 2024-10-17 LAB — POCT URINALYSIS DIP (CLINITEK)
Bilirubin, UA: NEGATIVE
Bilirubin, UA: NEGATIVE
Glucose, UA: NEGATIVE mg/dL
Glucose, UA: NEGATIVE mg/dL
Ketones, POC UA: NEGATIVE mg/dL
Ketones, POC UA: NEGATIVE mg/dL
Leukocytes, UA: NEGATIVE
Leukocytes, UA: NEGATIVE
Nitrite, UA: NEGATIVE
Nitrite, UA: NEGATIVE
POC PROTEIN,UA: NEGATIVE
POC PROTEIN,UA: NEGATIVE
Spec Grav, UA: 1.01
Spec Grav, UA: 1.025
Urobilinogen, UA: 0.2 U/dL
Urobilinogen, UA: 0.2 U/dL
pH, UA: 5.5
pH, UA: 5.5

## 2024-10-17 MED ORDER — TROSPIUM CHLORIDE 20 MG PO TABS: 20.0000 mg | ORAL_TABLET | Freq: Two times a day (BID) | ORAL | 2 refills | Status: AC

## 2024-10-17 NOTE — Assessment & Plan Note (Addendum)
-   h/o CVA with R hemiparesis, limited ROM of L hip, coccydynia and left sided pelvic floor myofascial pain in the setting of fibromyalgia - pain with deep penetration alleviated by position changes - The origin of pelvic floor muscle spasm can be multifactorial, including primary, reactive to a different pain source, trauma, or even part of a centralized pain syndrome.Treatment options include pelvic floor physical therapy, local (vaginal) or oral  muscle relaxants, pelvic muscle trigger point injections or centrally acting pain medications.   - referral to pelvic floor PT - Nuswab to rule out infectious etiology and urine testing to r/o ureaplasma - consider Rx topical lidocaine  1g PRN pain up to 3x/day or Amitriptyline 2.5%/ gabapentin  2.5%/ baclofen  2.5% in vaginal cream - discussed conservative management options with cold compress after intercourse  - encouraged position change and lubrication use during intercourse - consider treatment for DIV due to history of recurrent BV, negative Nuswab 09/27/24, 08/22/24, 05/29/24, 01/06/24 with repeat pending

## 2024-10-17 NOTE — Assessment & Plan Note (Signed)
-   history of rUTIs and BV after intercourse - discussed association of tobacco use and recurrent vaginal infection - encouraged tobacco cessation

## 2024-10-17 NOTE — Assessment & Plan Note (Signed)
-   discussed increased risk of neurogenic bladder - discussed need for urodynamic testing due to increased risk of refractory urinary symptoms and abnormal compliance - reassess symptoms after pelvic floor PT

## 2024-10-17 NOTE — Assessment & Plan Note (Addendum)
-   POCT UA + heme, pending catheterized UA microscopy and culture with ureaplasma testing. PVR 55mL - encouraged tobacco cessation - For management of gross hematuria, we discussed the importance of work-up including assessing the upper and lower GU tract with CT urogram and cystoscopy.  - Cr 0.8 in 08/10/24 - CT abd/pelvis w contrast 07/26/24 without renal calculi, focal lesion, or hydronephrosis noted - office to schedule cystoscopy

## 2024-10-17 NOTE — Assessment & Plan Note (Addendum)
-   encouraged follow-up with Dr. Alger due to persistent AUB and encouraged to proceed with D&C due to persistent symptoms - h/o IV iron  transfusion due to anemia, last Hgb 11.3 in 08/10/24 - previously declined IUD,  - h/o fibroids with TVUS 11/18/23 that showed possible polyp and ES 7.74mm with some vascularity  - EMB 07/29/23 by Dr. Cleotilde with benign endometrium with progestational effect. Negative for polyp, breakdown, atypia, hyperplasia, and carcinoma.

## 2024-10-17 NOTE — Assessment & Plan Note (Signed)
-   POCT UA + heme, pending catheterized urine testing and ureaplamsa to r/o infectious etiology. PVR 40mL - h/o transobturator sling placement and removal, failed botox 200U, Vesicare, Trospium , toviaz, Mybetriq - cystoscopy with small bladder capacity by Dr. Chavin in the past - We discussed the symptoms of overactive bladder (OAB), which include urinary urgency, urinary frequency, nocturia, with or without urge incontinence.  While we do not know the exact etiology of OAB, several treatment options exist. We discussed management including behavioral therapy (decreasing bladder irritants, urge suppression strategies, timed voids, bladder retraining), physical therapy, medication; for refractory cases posterior tibial nerve stimulation, sacral neuromodulation, and intravesical botulinum toxin injection.  For anticholinergic medications, we discussed the potential side effects of anticholinergics including dry eyes, dry mouth, constipation, cognitive impairment and urinary retention. For Beta-3 agonist medication, we discussed the potential side effect of elevated blood pressure which is more likely to occur in individuals with uncontrolled hypertension. 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 For irritative bladder we reviewed treatment options including altering her diet to avoid irritative beverages and foods as well as attempting to decrease stress and other exacerbating factors.  We also discussed using pyridium and similar over-the-counter medications for pain relief as needed. We discussed the medications including Tums, an antihistamine such as Vistaril , amitriptyline, and L-arginine.  We also discussed in-office bladder instillations for pain flares, as well as cystoscopy with  hydrodistention in the operating room, which can be both diagnostic and therapeutic.

## 2024-10-17 NOTE — Progress Notes (Incomplete)
 " New Patient Evaluation and Consultation  Referring Provider: Delbert Clam, MD PCP: Delbert Clam, MD Date of Service: 10/17/2024  SUBJECTIVE Chief Complaint: New Patient (Initial Visit) Amber Rose is a 48 y.o. female here today for urinary urgancy.)  History of Present Illness: Amber Rose is a 48 y.o. Black or African-American female seen in consultation at the request of Amber Rose for evaluation of urgency urinary incontinence.     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  Mixed urinary leakage started around 2014, stress > urgency prior to surgery Per chart review: - diagnosed with mixed urinary incontience by Amber Chavin, MD, underwent transobturator sling placement 02/05/15 with subseqeuent mesh exposure status post excision of mesh 04/01/15 with concomitant botox 200u injection due to painful intercourse, postcoital bleeding, 05/27/15 and 07/09/15. Denies need for self catheterization - 09/24/15 underwent radical excision by Amber. Cordella Libel, MD at [facility] with worsening mixed urinary incontinence refractory to multiple anticholinergics (Vesicare, Trospium , toviaz), Mybetriq and Botox - cystoscopy by Amber Rose (Urologist) noted small capacity bladder  - evaluated by Amber. Delos (urology) in [place], [place] 07/27/18, offered PTNS and patient declines additional treatments at this time  Reports history of rUTIs and BV in the past after intercourse, reduced since move from [place] due to reduced intercourse. Reports pain with intercourse since onset of sexual activity prior to mesh surgery.  Increased vaginal discharge, odor, diarrhea, urinary frequency and sensation of heat with urination without burning or blood x 2 years with positive ureaplasma testing 08/22/24.  Symptoms improved after doxycycline  08/26/24 Rx by Amber. Jomarie. Urinary symptoms returned and pt was  Azithromycin  and moxifloxacin  but caused diarrhea Rx 09/15/24 by Amber. Rudy along with diflucan  for candidiasis and Clotrimazole  Pain at tailbone and urethral pain worsens by sitting and light tough. Recommended topical lidocaine  by Amber. Delbert  Repeat ureaplasma testing and Nuswab negative 09/27/24 09/15/24 Urine culture with mixed flora and negative ureaplasma Odor resolved but continues to have discharge, itching, and irritation.  Using probiotics  H/o AUB, adenomyosis, menorrhagia, anemia, fibroids. Used micronor , prometrium  200mg  at bedtime in the past .TVUS 11/18/23 by Amber. Glennon with 10.92cm uterus, which is larger from last ultrasound Normal ovaries Fibroids seen measuring 1.14cm and 1.11cm EM 7.66mm with some vascularity seen ?polyp Patient reports taking 4 micronor  pills/day to avoid IV iron , offered Lupron, Mirena IUD, surgical management. Declined IUD, Nexplanon due to reaction to implants, Depo, [place].  EMB 07/29/23 by Amber. Cleotilde with benign endometrium with progestational effect. Negative for polyp, breakdown, atypia, hyperplasia, and carcinoma. Using testosterone  for decreased libido managed by by Amber. Alger, declined D&C for AUB Cervical trauma with lesion noted at 1 o'clock on exam 04/21/24, attributed to forceps delivery Smokes 0-4 cigarettes/day  Diarrhea and rectal pain managed by Honora, PA (gastro) last seen 07/26/24, improved after stopping ozempic  and spironolactone . History of esophageal dysphagia with dilation and possible gastroparesis Rx cholestyramine , denies starting Rx  Review of records significant for: CVA with lacunar infarct in the left thalamus 01/08/24, T2DM on insulin  and semaglutide , L5-S1 degenerative disc disease, Hidradenitis, tobacco use, angioedema Cholestyramine  s/p cholecystectomy, adenomyosis, CKD, h/o C. Diff. Endometriosis, migraine, fibromyalgia, pancreatitis, HTN managed by cardiology  CLINICAL DATA:  Coccygeal pain.   EXAM: DG  SACRUM/COCCYX 1-2+V   COMPARISON:  Reformats from abdominopelvic CT 07/26/2024   FINDINGS: There is no evidence of fracture or other focal bone lesions. The sacral ala are maintained. The sacroiliac  joints are congruent. Trace spurring is sclerosis about the inferior aspect of the sacroiliac joints, left greater than right. Chronic bilateral hip arthropathy.   IMPRESSION: 1. No acute findings. 2. Mild degenerative change of the sacroiliac joints, left greater than right.     Electronically Signed   By: Amber Rose M.D.   On: 10/01/2024 16:43  CT abd/pelvis w/ contrast 07/26/24 CLINICAL DATA:  Left lower quadrant abdominal pain.   EXAM: CT ABDOMEN AND PELVIS WITH CONTRAST   TECHNIQUE: Multidetector CT imaging of the abdomen and pelvis was performed using the standard protocol following bolus administration of intravenous contrast.   RADIATION DOSE REDUCTION: This exam was performed according to the departmental dose-optimization program which includes automated exposure control, adjustment of the mA and/or kV according to patient size and/or use of iterative reconstruction technique.   CONTRAST:  OMNIPAQUE  IOHEXOL  350 MG/ML SOLN   COMPARISON:  03/02/2022   FINDINGS: Lower chest: No acute abnormality.   Hepatobiliary: No focal liver abnormality is seen. Status post cholecystectomy. No biliary dilatation.   Pancreas: Unremarkable. No pancreatic ductal dilatation or surrounding inflammatory changes.   Spleen: Normal in size without focal abnormality.   Adrenals/Urinary Tract: Adrenal glands are unremarkable. Kidneys are normal, without renal calculi, focal lesion, or hydronephrosis. Bladder is unremarkable.   Stomach/Bowel: Moderate gastric distension. No bowel obstruction or ileus. No free intraperitoneal air. The appendix is surgically absent. No significant diverticulosis.   Vascular/Lymphatic: Mild atherosclerosis of the abdominal aorta without  aneurysm. No lymphadenopathy.   Reproductive: Normal uterus. Approximately 4.5 cm right adnexal cyst is likely physiologic.   Other: No abdominal wall hernia or abnormality. No abdominopelvic ascites.   Musculoskeletal: No acute or significant osseous findings.   IMPRESSION: 1. Moderate gastric distension. 2. 4.5 cm right adnexal cyst is likely physiologic. No follow-up imaging recommended. Note: This recommendation does not apply to premenarchal patients and to those with increased risk (genetic, family history, elevated tumor markers or other high-risk factors) of ovarian cancer. Reference: JACR 2020 Feb; 17(2):248-254 3. Aortic atherosclerosis.     Electronically Signed   By: Marcey Moan M.D.   On: 07/26/2024 16:16  MR pelvic w/ and w/o contrast 03/12/23 CLINICAL DATA:  Left ovarian mass identified by ultrasound, patient postmenopausal   EXAM: MRI PELVIS WITHOUT AND WITH CONTRAST   TECHNIQUE: Multiplanar multisequence MR imaging of the pelvis was performed both before and after administration of intravenous contrast.   CONTRAST:  7 mL Vueway  gadolinium contrast IV   COMPARISON:  Pelvic ultrasound, 02/06/2023, CT abdomen pelvis, 03/02/2022   FINDINGS: Urinary Tract:  No abnormality visualized.   Bowel:  Unremarkable visualized pelvic bowel loops.   Vascular/Lymphatic: No pathologically enlarged lymph nodes. No significant vascular abnormality seen.   Reproductive: Uterine adenomyosis. Intramural fibroids, largest at the left aspect of the uterine fundus measuring 3.2 cm (series 12, image 26). Multiple small follicles of the bilateral ovaries. No mass or suspicious cystic lesion.   Other:  Small volume free fluid in the low pelvis.   Musculoskeletal: No suspicious bone lesions identified.   IMPRESSION: 1. Multiple small follicles of the bilateral ovaries. No mass or suspicious cystic lesion. Cystic lesion identified in the left ovary on prior  ultrasound presumably reflected a since resolved hemorrhagic ovarian cyst. 2. Uterine adenomyosis. 3. Intramural fibroids, largest at the left aspect of the uterine fundus measuring 3.2 cm. 4. Small volume free fluid in the low pelvis.     Electronically Signed   By: Marolyn JONETTA Marlyce CHRISTELLA.D.  On: 03/12/2023 10:00  Urinary Symptoms: Leaks urine with cough/ sneeze, exercise, during sex, and with urgency Leaks 10 time(s) per days attributed to cough from blood pressure medication and movement during intercourse Leak 3-4x/day with urgency Pad use: 5 liners/ mini-pads per day, sometimes uses absorbant underwear with 1 change/day Patient is bothered by UI symptoms.  Day time voids 6-8.  Nocturia: 3 times per night to void. Denies snoring Reports leg swelling Denies decreasing fluid intake before bedtime, takes medications at 8pm Voiding dysfunction:  empties bladder well.  Patient does not use a catheter to empty bladder.  When urinating, patient feels she has no difficulties Drinks: 64-80oz water per day, zero sugar soda 2x/week, 8oz tea/day   UTIs: 1 UTI's in the last year.   Reports history of blood in urine and orange color in urine No results found for the last 90 days.   Pelvic Organ Prolapse Symptoms:                  Patient Denies a feeling of a bulge the vaginal area. Patient Denies seeing a bulge.   Bowel Symptom: Bowel movements: 2 time(s) per day Stool consistency: hard, soft , or loose, Type VI-VII mostly, intermittent type IV  Straining: yes.  Splinting: yes.  Incomplete evacuation: yes.  Patient Admits to accidental bowel leakage / fecal incontinence with fecal smearing  Occurs: 2 time(s) per day  Consistency with leakage: soft  Bowel regimen: diet and fiber S/p gallbladder surgery with onset of leakage, denies cholestyramine  use with Rx sent 08/27/24 Last colonoscopy: Results 4 mm tubular adenoma polyp, due for repeat 07/2026 [facility] Colonoscopy          Upcoming      Colonoscopy (Every 7 Years) Next due on 08/16/2026    08/17/2019  COLONOSCOPY  Only the first 1 history entries have been loaded, but more history exists.               Sexual Function Sexually active: no.  Sexual orientation: Straight Pain with sex: Yes, deep in the pelvis alleviated by position changes  Pelvic Pain Admits to pelvic pain Location: midline lower abdomen Pain occurs: during intercourse with pushing on abdomen/bladder Prior pain treatment: birth control pill Improved by: resting, position changes Worsened by: intercourse   Past Medical History:  Past Medical History:  Diagnosis Date   Abnormal Pap smear of cervix    yrs ago   Adenomyosis    Allergy     Anemia    Anxiety    Aortic atherosclerosis    Arthritis    Asthma    Chronic kidney disease    Chronic UTI    Clostridium difficile infection    Depression    Diabetes mellitus without complication (HCC) 10/22/2023   A1C 8.8   Diverticulosis    Endometriosis    Fibroid    Fibromyalgia    GERD (gastroesophageal reflux disease)    Hiatal hernia    Hyperlipidemia    Hypertension    Internal hemorrhoids    Meningitis, viral    Migraines    Pancreatitis    Pure hypercholesterolemia 12/23/2020   Stroke (HCC) 12/2023   Tubular adenoma of colon      Past Surgical History:   Past Surgical History:  Procedure Laterality Date   APPENDECTOMY  1990   CERVICAL BIOPSY  W/ LOOP ELECTRODE EXCISION     LEEP   CESAREAN SECTION  2002   CHOLECYSTECTOMY  2011   COLONOSCOPY  UPPER GASTROINTESTINAL ENDOSCOPY     URINARY SURGERY     urethra sling, removal, and then revision 2016 (4 surgeries)   WISDOM TOOTH EXTRACTION       Past OB/GYN History: OB History  Gravida Para Term Preterm AB Living  4 3 3  0 1 3  SAB IAB Ectopic Multiple Live Births  0 1 0 0 3    # Outcome Date GA Lbr Len/2nd Weight Sex Type Anes PTL Lv  4 IAB           3 Term     F  CS-LTranv   LIV  2 Term     M Vag-Forceps   LIV  1 Term     M Vag-Spont   LIV    Vaginal deliveries: largest infant 7lb8oz, perineal laceration repaired with pain for 2-3 years and dyspareunia postpartum. Denies FI postpartum. Forceps/ Vacuum deliveries: 1 forcep, Cesarean section: 1 Menopausal: No, LMP No LMP recorded. Contraception: BTL. Last pap smear.  Any history of abnormal pap smears: yes. H/o LEEP    Component Value Date/Time   DIAGPAP  09/13/2020 0940    - Negative for intraepithelial lesion or malignancy (NILM)   DIAGPAP - Benign reactive/reparative changes 09/13/2020 0940   DIAGPAP  12/25/2016 0000    NEGATIVE FOR INTRAEPITHELIAL LESIONS OR MALIGNANCY.   HPVHIGH Negative 09/13/2020 0940   ADEQPAP  09/13/2020 0940    Satisfactory for evaluation; transformation zone component PRESENT.   ADEQPAP  12/25/2016 0000    Satisfactory for evaluation  endocervical/transformation zone component ABSENT.    Medications: Patient has a current medication list which includes the following prescription(s): albuterol , aspirin  ec, baclofen , accu-chek aviva plus, bupropion , carvedilol , clotrimazole , freestyle libre 3 reader, freestyle libre 3 plus sensor, cyanocobalamin , ec-rx testosterone , baqsimi  one pack, gvoke hypopen  1-pack, freestyle test strips, hydrocortisone , hydroxyzine , insulin  lispro, insulin  pen needle, insulin  pen needle, accu-chek softclix, freestyle, lidocaine , metformin , omeprazole , pregabalin , saccharomyces boulardii, trospium , valacyclovir , azithromycin , and rybelsus .   Allergies: Patient is allergic to amlodipine , atorvastatin , crestor  [rosuvastatin ], interferon beta-1a, irbesartan , losartan, metronidazole , penicillins, xanax [alprazolam], glyburide, linagliptin, and moxifloxacin .   Social History: Social History[1]  Relationship status: single Patient lives alone.   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 Regular exercise: Yes: walking 10K steps History of abuse: No  Family History:   Family History  Problem Relation Age of Onset   Cancer Mother        cervical, breast, lung, skin, bladder-ex smoker   Hypertension Mother    Renal cancer Mother    Bladder Cancer Mother    Diabetes Father    Heart attack Maternal Grandfather    Multiple sclerosis Paternal Grandmother    Uterine cancer Neg Hx      Review of Systems: Review of Systems  Constitutional:  Negative for fever, malaise/fatigue and weight loss.  Respiratory:  Positive for cough. Negative for shortness of breath and wheezing.   Cardiovascular:  Negative for chest pain, palpitations and leg swelling.  Gastrointestinal:  Positive for abdominal pain. Negative for blood in stool.       Leakage  Genitourinary:  Positive for dysuria, frequency and urgency. Negative for hematuria.       Vaginal discharge, painful menses  Skin:  Negative for rash.  Neurological:  Positive for weakness. Negative for dizziness and headaches.  Endo/Heme/Allergies:  Does not bruise/bleed easily.       Hot flashes  Psychiatric/Behavioral:  Positive for depression. The  patient is nervous/anxious.      OBJECTIVE Physical Exam: Vitals:   10/17/24 1110  BP: 122/81  Pulse: 69  Weight: 159 lb 9.6 oz (72.4 kg)  Height: 5' 3.58 (1.615 m)    Physical Exam Constitutional:      General: She is not in acute distress.    Appearance: Normal appearance.  Genitourinary:     Bladder and urethral meatus normal.     No lesions in the vagina.     Genitourinary Comments: Pain with palpation of puborectalis and over coccyx, limited left hip abduction and range of motion     Right Labia: No rash, tenderness, lesions, skin changes or Bartholin's cyst.    Left Labia: No tenderness, lesions, skin changes, Bartholin's cyst or rash.       No vaginal discharge, erythema, tenderness, bleeding, ulceration or granulation tissue.     Anterior and  posterior vaginal prolapse present.    No vaginal atrophy present.     Right Adnexa: not tender, not full and no mass present.    Left Adnexa: not tender, not full and no mass present.    No cervical motion tenderness, discharge, friability, lesion, polyp or nabothian cyst.     Uterus is not enlarged, fixed, tender or irregular.     No uterine mass detected.    Urethral meatus caruncle not present.    No urethral prolapse, tenderness, mass, hypermobility, discharge or stress urinary incontinence with cough stress test present.     Bladder is not tender, urgency on palpation not present and masses not present.      Pelvic Floor: Levator muscle strength is 4/5.    Levator ani is tender (right sided) and obturator internus is tender (right sided).     No asymmetrical contractions present and no pelvic spasms present.    Asymmetrical pelvic sensation.     Anal wink present and BC reflex present. Cardiovascular:     Rate and Rhythm: Normal rate.  Pulmonary:     Effort: Pulmonary effort is normal. No respiratory distress.  Abdominal:     General: There is no distension.     Palpations: Abdomen is soft. There is no mass.     Tenderness: There is abdominal tenderness.     Hernia: No hernia is present.   Neurological:     Mental Status: She is alert.  Vitals reviewed. Exam conducted with a chaperone present.      POP-Q:   POP-Q  -2                                            Aa   -2                                           Ba  -9                                              C   3  Gh  5                                            Pb  10                                            tvl   -1                                            Ap  -1                                            Bp  -8                                              D     Post-Void Residual (PVR) by Bladder Scan: In order to evaluate bladder emptying, we  discussed obtaining a postvoid residual and patient agreed to this procedure.  Procedure: The ultrasound unit was placed on the patient's abdomen in the suprapubic region after the patient had voided.    Post Void Residual - 10/17/24 1201       Post Void Residual   Post Void Residual 55 mL         Straight Catheterization Procedure for PVR: After verbal consent was obtained from the patient for catheterization to assess bladder emptying and residual volume the urethra and surrounding tissues were prepped with betadine and an in and out catheterization was performed.  PVR was 40mL.  Urine appeared neon yellow from supplementation. The patient tolerated the procedure well.   Laboratory Results: Lab Results  Component Value Date   COLORU yellow 10/17/2024   CLARITYU clear 10/17/2024   GLUCOSEUR negative 10/17/2024   BILIRUBINUR NEGATIVE 10/17/2024   KETONESU Negative 08/22/2024   SPECGRAV 1.025 10/17/2024   RBCUR trace-lysed (A) 10/17/2024   PHUR 5.5 10/17/2024   PROTEINUR NEGATIVE 10/17/2024   UROBILINOGEN 0.2 10/17/2024   LEUKOCYTESUR NEGATIVE 10/17/2024    Lab Results  Component Value Date   CREATININE 0.80 08/10/2024   CREATININE 0.86 07/26/2024   CREATININE 0.80 07/26/2024    Lab Results  Component Value Date   HGBA1C 5.2 08/22/2024    Lab Results  Component Value Date   HGB 11.3 (L) 08/10/2024     ASSESSMENT AND PLAN [name] is a 48 y.o. with:  1. Mixed stress and urge urinary incontinence   2. Nocturia   3. Tobacco abuse   4. Dyspareunia in female   5. History of pelvic surgery   6. History of gross hematuria   7. Incontinence of feces, unspecified fecal incontinence type   8. Abnormal uterine bleeding (AUB)   9. Cerebrovascular accident (CVA), unspecified mechanism (HCC)   10. Yeast vaginitis     Mixed stress and urge urinary incontinence Assessment & Plan: - POCT UA + heme, pending catheterize UA microscopy, culture with ureplasma testing.  PVR 40mL - stress > urgency s/p transobturator sling excision and CVA - discussed urodynamic testing needed due to history of pelvic surgery and refractory symptoms - For treatment of stress urinary incontinence,  non-surgical options include expectant management, weight loss, physical therapy, as well as a pessary.  Surgical options include a midurethral sling, Burch urethropexy, and transurethral injection of a bulking agent. - referral to pelvic floor PT - pt desires to avoid surgical intervention and mesh use, encouraged to consider pessary fitting - h/o transobturator sling placement and removal, failed botox 200U, Vesicare, Trospium , toviaz, Mybetriq - cystoscopy with small bladder capacity by Amber. Chavin in the past - We discussed the symptoms of overactive bladder (OAB), which include urinary urgency, urinary frequency, nocturia, with or without urge incontinence.  While we do not know the exact etiology of OAB, several treatment options exist. We discussed management including behavioral therapy (decreasing bladder irritants, urge suppression strategies, timed voids, bladder retraining), physical therapy, medication; for refractory cases posterior tibial nerve stimulation, sacral neuromodulation, and intravesical botulinum toxin injection.  For anticholinergic medications, we discussed the potential side effects of anticholinergics including dry eyes, dry mouth, constipation, cognitive impairment and urinary retention. For Beta-3 agonist medication, we discussed the potential side effect of elevated blood pressure which is more likely to occur in individuals with uncontrolled hypertension. 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 For irritative bladder we reviewed treatment options  including altering her diet to avoid irritative beverages and foods as well as attempting to decrease stress and other exacerbating factors.  We also discussed using pyridium and similar over-the-counter medications for pain relief as needed. We discussed the medications including Tums, an antihistamine such as Vistaril , amitriptyline, and L-arginine.  We also discussed in-office bladder instillations for pain flares, as well as cystoscopy with hydrodistention in the operating room, which can be both diagnostic and therapeutic.   Orders: -     POCT URINALYSIS DIP (CLINITEK) -     AMB referral to rehabilitation -     POCT URINALYSIS DIP (CLINITEK) -     Trospium  Chloride; Take 1 tablet (20 mg total) by mouth 2 (two) times daily.  Dispense: 60 tablet; Refill: 2  Nocturia Assessment & Plan: - avoid fluid intake 3 hours before bedtime - elevated feet during the day or use compression socks to reduce lower extremity swelling - trial of trospium  20mg  twice daily   Orders: -     POCT URINALYSIS DIP (CLINITEK) -     AMB referral to rehabilitation  Tobacco abuse Assessment & Plan: - history of rUTIs and BV after intercourse - discussed association of tobacco use and recurrent vaginal infection - encouraged tobacco cessation   Dyspareunia in female Assessment & Plan: - h/o CVA with R hemiparesis, limited ROM of L hip, coccydynia and left sided pelvic floor myofascial pain in the setting of fibromyalgia - pain with deep penetration alleviated by position changes - The origin of pelvic floor muscle spasm can be multifactorial, including primary, reactive to a different pain source, trauma, or even part of a centralized pain syndrome.Treatment options include pelvic floor physical therapy, local (vaginal) or oral  muscle relaxants, pelvic muscle trigger point injections or centrally acting pain medications.   - referral to pelvic floor PT - Nuswab to rule out infectious etiology and urine  testing  to r/o ureaplasma - consider Rx topical lidocaine  1g PRN pain up to 3x/day or Amitriptyline 2.5%/ gabapentin  2.5%/ baclofen  2.5% in vaginal cream - discussed conservative management options with cold compress after intercourse  - encouraged position change and lubrication use during intercourse - consider treatment for DIV due to history of recurrent BV, negative Nuswab 09/27/24, 08/22/24, 05/29/24, 01/06/24 with repeat pending  Orders: -     AMB referral to rehabilitation -     Cervicovaginal ancillary only  History of pelvic surgery Assessment & Plan: - transobturator sling 02/05/15 by Amber. Chavin - s/p excision of mesh 04/01/15 with concomitant botox 200u injection, 05/27/15 and 07/09/15 - 09/24/15 radical excision by Amber. Cordella Libel, MD at [facility] - will need urodynamics prior to additional treatment - pt desires to avoid foreign material and surgical intervention - PVR 40mL by catheterization   History of gross hematuria Assessment & Plan:  >>ASSESSMENT AND PLAN FOR ABNORMAL URINALYSIS WRITTEN ON 10/17/2024 11:18 PM BY Joyceline Maiorino T, MD  - POCT UA + heme, pending catheterized UA microscopy and culture with ureaplasma testing. PVR 55mL - encouraged tobacco cessation - For management of gross hematuria, we discussed the importance of work-up including assessing the upper and lower GU tract with CT urogram and cystoscopy.  - Cr 0.8 in 08/10/24 - CT abd/pelvis w contrast 07/26/24 without renal calculi, focal lesion, or hydronephrosis noted - office to schedule cystoscopy   Orders: -     Urine Culture; Future -     Urinalysis, Complete w Microscopic; Future -     Cervicovaginal ancillary only  Incontinence of feces, unspecified fecal incontinence type Assessment & Plan: - encouraged to start cholestyramine   - diarrhea improved after discontinuation of Ozempic  - Treatment options include anti-diarrhea medication (loperamide/ Imodium OTC or prescription lomotil), fiber  supplements, physical therapy, and possible sacral neuromodulation or surgery.   - referral to pelvic floor PT - trial of Trospium  20mg  BID   Abnormal uterine bleeding (AUB) Assessment & Plan: - encouraged follow-up with Amber. Alger due to persistent AUB and encouraged to proceed with D&C due to persistent symptoms - h/o IV iron  transfusion due to anemia, last Hgb 11.3 in 08/10/24 - previously declined IUD,  - h/o fibroids with TVUS 11/18/23 that showed possible polyp and ES 7.67mm with some vascularity  - EMB 07/29/23 by Amber. Cleotilde with benign endometrium with progestational effect. Negative for polyp, breakdown, atypia, hyperplasia, and carcinoma.   Cerebrovascular accident (CVA), unspecified mechanism (HCC) Assessment & Plan: - discussed increased risk of neurogenic bladder - discussed need for urodynamic testing due to increased risk of refractory urinary symptoms and abnormal compliance - reassess symptoms after pelvic floor PT   Yeast vaginitis Assessment & Plan: - consider treatment for DIV due to history of recurrent BV, negative Nuswab 09/27/24, 08/22/24, 05/29/24, 01/06/24 with repeat pending - continue to monitor glucose control with last HbA1C 5.2 in 08/22/24   Time spent: I spent *** minutes dedicated to the care of this patient on the date of this encounter to include pre-visit review of records, face-to-face time with the patient discussing *** and post visit documentation and ordering medication/ testing.    Lianne ONEIDA Gillis, MD          [1] Social History Tobacco Use   Smoking status: Some Days    Current packs/day: 0.10    Average packs/day: 0.1 packs/day for 20.0 years (2.0 ttl pk-yrs)    Types: Cigarettes   Smokeless tobacco: Never  Tobacco comments:    Patient is engaged in health coaching for smoking cessation as of 12/26/20. Patient stated that she has stopped smoking, drinking and any form of drug usage since her stroke.  Vaping Use   Vaping status:  Never Used  Substance Use Topics   Alcohol use: Yes    Comment: occ   Drug use: Yes    Types: Marijuana    Comment: occ  "

## 2024-10-17 NOTE — Assessment & Plan Note (Addendum)
-   POCT UA + heme, pending catheterize UA microscopy, culture with ureplasma testing. PVR 40mL - stress > urgency s/p transobturator sling excision and CVA - discussed urodynamic testing needed due to history of pelvic surgery and refractory symptoms - For treatment of stress urinary incontinence,  non-surgical options include expectant management, weight loss, physical therapy, as well as a pessary.  Surgical options include a midurethral sling, Burch urethropexy, and transurethral injection of a bulking agent. - referral to pelvic floor PT - pt desires to avoid surgical intervention and mesh use, encouraged to consider pessary fitting - h/o transobturator sling placement and removal, failed botox 200U, Vesicare, Trospium , toviaz, Mybetriq - cystoscopy with small bladder capacity by Dr. Chavin in the past - We discussed the symptoms of overactive bladder (OAB), which include urinary urgency, urinary frequency, nocturia, with or without urge incontinence.  While we do not know the exact etiology of OAB, several treatment options exist. We discussed management including behavioral therapy (decreasing bladder irritants, urge suppression strategies, timed voids, bladder retraining), physical therapy, medication; for refractory cases posterior tibial nerve stimulation, sacral neuromodulation, and intravesical botulinum toxin injection.  For anticholinergic medications, we discussed the potential side effects of anticholinergics including dry eyes, dry mouth, constipation, cognitive impairment and urinary retention. For Beta-3 agonist medication, we discussed the potential side effect of elevated blood pressure which is more likely to occur in individuals with uncontrolled hypertension. - discussed fluid management and caffeine reduction - referral to pelvic floor PT - Gemtesa not covered by insurance, Rx trospium  20mg  BID due to cost and reassess fecal leakage symptoms - discussed overlapping  symptomatology with bladder pain syndrome. For irritative bladder we reviewed treatment options including altering her diet to avoid irritative beverages and foods as well as attempting to decrease stress and other exacerbating factors.  We also discussed using pyridium and similar over-the-counter medications for pain relief as needed. We discussed the medications including Tums, an antihistamine such as Vistaril , amitriptyline, and L-arginine.  We also discussed in-office bladder instillations for pain flares, as well as cystoscopy with hydrodistention in the operating room, which can be both diagnostic and therapeutic.

## 2024-10-17 NOTE — Assessment & Plan Note (Signed)
-   avoid fluid intake 3 hours before bedtime - elevated feet during the day or use compression socks to reduce lower extremity swelling - trial of trospium  20mg  twice daily

## 2024-10-17 NOTE — Assessment & Plan Note (Addendum)
-   transobturator sling 02/05/15 by Dr. Chavin - s/p excision of mesh 04/01/15 with concomitant botox 200u injection, 05/27/15 and 07/09/15 - 09/24/15 radical excision by Dr. Cordella Libel, MD at [facility] - will need urodynamics prior to additional treatment - pt desires to avoid foreign material and surgical intervention - PVR 40mL by catheterization

## 2024-10-17 NOTE — Patient Instructions (Addendum)
 We discussed the symptoms of overactive bladder (OAB), which include urinary urgency, urinary frequency, night-time urination, with or without urge incontinence.  We discussed management including behavioral therapy (decreasing bladder irritants by following a bladder diet, urge suppression strategies, timed voids, bladder retraining), physical therapy, medication; and for refractory cases posterior tibial nerve stimulation, sacral neuromodulation, and intravesical botulinum toxin injection.   Start Trospium  20mg  twice daily for urinary symptoms. Please review side effects of anticholinergic medications and the potential side effects of anticholinergics including dry eyes, dry mouth, constipation, cognitive impairment and urinary retention. Stop medication and seek care immediately if she experiences visual changes or inability to void  Please visit the website below to sign up for an account. We will need an email address to send along with your prescription to verify your prescription once you have signed up.  [email or web address]  Trospium  Chloride ER Capsule Extended Release  60mg   30 count $31.11  Or    Trospium  Chloride (2 times a day dosing) Tablet  20mg   60 count  $14.03  For night time frequency: - avoid fluid intake 3 hours before bedtime - elevated your feet during the day or use compression socks to reduce lower extremity swelling  Accidental Bowel Leakage:  - Treatment options include anti-diarrhea medication (loperamide/ Imodium OTC or prescription lomotil), fiber supplements, physical therapy, and possible sacral neuromodulation or surgery.     The origin of pelvic floor muscle spasm can be multifactorial, including primary, reactive to a different pain source, trauma, or even part of a centralized pain syndrome.Treatment options include pelvic floor physical therapy, local (vaginal) or oral  muscle relaxants, pelvic muscle trigger point injections or  centrally acting pain medications.     Please call [phone] to schedule the earliest appointment for pelvic floor PT.  Please continue to reduce tobacco use.

## 2024-10-17 NOTE — Progress Notes (Signed)
 " New Patient Evaluation and Consultation  Referring Provider: Delbert Clam, MD PCP: Delbert Clam, MD Date of Service: 10/17/2024  SUBJECTIVE Chief Complaint: New Patient (Initial Visit) Amber Rose is a 48 y.o. female here today for urinary urgancy.)  History of Present Illness: Amber Rose is a 48 y.o. Black or African-American female seen in consultation at the request of Dr Newlin for evaluation of urgency urinary incontinence.     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  Mixed urinary leakage started around 2014, stress > urgency prior to surgery Per chart review: - diagnosed with mixed urinary incontience by Grant Chavin, MD, underwent transobturator sling placement 02/05/15 with subseqeuent mesh exposure status post excision of mesh 04/01/15 with concomitant botox 200u injection due to painful intercourse, postcoital bleeding, 05/27/15 and 07/09/15. Denies need for self catheterization - 09/24/15 underwent radical excision by Dr. Cordella Libel, MD at [facility] with worsening mixed urinary incontinence refractory to multiple anticholinergics (Vesicare, Trospium , toviaz), Mybetriq and Botox - cystoscopy by Dr Chavin (Urologist) noted small capacity bladder  - evaluated by Dr. Delos (urology) in [place], [place] 07/27/18, offered PTNS and patient declines additional treatments at this time  Reports history of rUTIs and BV in the past after intercourse, reduced since move from [place] due to reduced intercourse. Reports pain with intercourse since onset of sexual activity prior to mesh surgery.  Increased vaginal discharge, odor, diarrhea, urinary frequency and sensation of heat with urination without burning or blood x 2 years with positive ureaplasma testing 08/22/24.  Symptoms improved after doxycycline  08/26/24 Rx by Dr. Jomarie. Urinary symptoms returned and pt was  Azithromycin  and moxifloxacin  but caused diarrhea Rx 09/15/24 by Dr. Rudy along with diflucan  for candidiasis and Clotrimazole  Pain at tailbone and urethral pain worsens by sitting and light tough. Recommended topical lidocaine  by Dr. Delbert  Repeat ureaplasma testing and Nuswab negative 09/27/24 09/15/24 Urine culture with mixed flora and negative ureaplasma Odor resolved but continues to have discharge, itching, and irritation.  Using probiotics  H/o AUB, adenomyosis, menorrhagia, anemia, fibroids. Used micronor , prometrium  200mg  at bedtime in the past .TVUS 11/18/23 by Dr. Glennon with 10.92cm uterus, which is larger from last ultrasound Normal ovaries Fibroids seen measuring 1.14cm and 1.11cm EM 7.35mm with some vascularity seen ?polyp Patient reports taking 4 micronor  pills/day to avoid IV iron , offered Lupron, Mirena IUD, surgical management. Declined IUD, Nexplanon due to reaction to implants, Depo, [place].  EMB 07/29/23 by Dr. Cleotilde with benign endometrium with progestational effect. Negative for polyp, breakdown, atypia, hyperplasia, and carcinoma. Using testosterone  for decreased libido managed by by Dr. Alger, declined D&C for AUB Cervical trauma with lesion noted at 1 o'clock on exam 04/21/24, attributed to forceps delivery Smokes 0-4 cigarettes/day  Diarrhea and rectal pain managed by Honora, PA (gastro) last seen 07/26/24, improved after stopping ozempic  and spironolactone . History of esophageal dysphagia with dilation and possible gastroparesis Rx cholestyramine , denies starting Rx  Review of records significant for: CVA with lacunar infarct in the left thalamus 01/08/24, T2DM on insulin  and semaglutide , L5-S1 degenerative disc disease, Hidradenitis, tobacco use, angioedema Cholestyramine  s/p cholecystectomy, adenomyosis, CKD, h/o C. Diff. Endometriosis, migraine, fibromyalgia, pancreatitis, HTN managed by cardiology  CLINICAL DATA:  Coccygeal pain.   EXAM: DG  SACRUM/COCCYX 1-2+V   COMPARISON:  Reformats from abdominopelvic CT 07/26/2024   FINDINGS: There is no evidence of fracture or other focal bone lesions. The sacral ala are maintained. The sacroiliac  joints are congruent. Trace spurring is sclerosis about the inferior aspect of the sacroiliac joints, left greater than right. Chronic bilateral hip arthropathy.   IMPRESSION: 1. No acute findings. 2. Mild degenerative change of the sacroiliac joints, left greater than right.     Electronically Signed   By: Andrea Gasman M.D.   On: 10/01/2024 16:43  CT abd/pelvis w/ contrast 07/26/24 CLINICAL DATA:  Left lower quadrant abdominal pain.   EXAM: CT ABDOMEN AND PELVIS WITH CONTRAST   TECHNIQUE: Multidetector CT imaging of the abdomen and pelvis was performed using the standard protocol following bolus administration of intravenous contrast.   RADIATION DOSE REDUCTION: This exam was performed according to the departmental dose-optimization program which includes automated exposure control, adjustment of the mA and/or kV according to patient size and/or use of iterative reconstruction technique.   CONTRAST:  OMNIPAQUE  IOHEXOL  350 MG/ML SOLN   COMPARISON:  03/02/2022   FINDINGS: Lower chest: No acute abnormality.   Hepatobiliary: No focal liver abnormality is seen. Status post cholecystectomy. No biliary dilatation.   Pancreas: Unremarkable. No pancreatic ductal dilatation or surrounding inflammatory changes.   Spleen: Normal in size without focal abnormality.   Adrenals/Urinary Tract: Adrenal glands are unremarkable. Kidneys are normal, without renal calculi, focal lesion, or hydronephrosis. Bladder is unremarkable.   Stomach/Bowel: Moderate gastric distension. No bowel obstruction or ileus. No free intraperitoneal air. The appendix is surgically absent. No significant diverticulosis.   Vascular/Lymphatic: Mild atherosclerosis of the abdominal aorta without  aneurysm. No lymphadenopathy.   Reproductive: Normal uterus. Approximately 4.5 cm right adnexal cyst is likely physiologic.   Other: No abdominal wall hernia or abnormality. No abdominopelvic ascites.   Musculoskeletal: No acute or significant osseous findings.   IMPRESSION: 1. Moderate gastric distension. 2. 4.5 cm right adnexal cyst is likely physiologic. No follow-up imaging recommended. Note: This recommendation does not apply to premenarchal patients and to those with increased risk (genetic, family history, elevated tumor markers or other high-risk factors) of ovarian cancer. Reference: JACR 2020 Feb; 17(2):248-254 3. Aortic atherosclerosis.     Electronically Signed   By: Marcey Moan M.D.   On: 07/26/2024 16:16  MR pelvic w/ and w/o contrast 03/12/23 CLINICAL DATA:  Left ovarian mass identified by ultrasound, patient postmenopausal   EXAM: MRI PELVIS WITHOUT AND WITH CONTRAST   TECHNIQUE: Multiplanar multisequence MR imaging of the pelvis was performed both before and after administration of intravenous contrast.   CONTRAST:  7 mL Vueway  gadolinium contrast IV   COMPARISON:  Pelvic ultrasound, 02/06/2023, CT abdomen pelvis, 03/02/2022   FINDINGS: Urinary Tract:  No abnormality visualized.   Bowel:  Unremarkable visualized pelvic bowel loops.   Vascular/Lymphatic: No pathologically enlarged lymph nodes. No significant vascular abnormality seen.   Reproductive: Uterine adenomyosis. Intramural fibroids, largest at the left aspect of the uterine fundus measuring 3.2 cm (series 12, image 26). Multiple small follicles of the bilateral ovaries. No mass or suspicious cystic lesion.   Other:  Small volume free fluid in the low pelvis.   Musculoskeletal: No suspicious bone lesions identified.   IMPRESSION: 1. Multiple small follicles of the bilateral ovaries. No mass or suspicious cystic lesion. Cystic lesion identified in the left ovary on prior  ultrasound presumably reflected a since resolved hemorrhagic ovarian cyst. 2. Uterine adenomyosis. 3. Intramural fibroids, largest at the left aspect of the uterine fundus measuring 3.2 cm. 4. Small volume free fluid in the low pelvis.     Electronically Signed   By: Marolyn JONETTA Marlyce CHRISTELLA.D.  On: 03/12/2023 10:00  Urinary Symptoms: Leaks urine with cough/ sneeze, exercise, during sex, and with urgency Leaks 10 time(s) per days attributed to cough from blood pressure medication and movement during intercourse Leak 3-4x/day with urgency Pad use: 5 liners/ mini-pads per day, sometimes uses absorbant underwear with 1 change/day Patient is bothered by UI symptoms.  Day time voids 6-8.  Nocturia: 3 times per night to void. Denies snoring Reports leg swelling Denies decreasing fluid intake before bedtime, takes medications at 8pm Voiding dysfunction:  empties bladder well.  Patient does not use a catheter to empty bladder.  When urinating, patient feels she has no difficulties Drinks: 64-80oz water per day, zero sugar soda 2x/week, 8oz tea/day   UTIs: 1 UTI's in the last year.   Reports history of blood in urine and orange color in urine No results found for the last 90 days.   Pelvic Organ Prolapse Symptoms:                  Patient Denies a feeling of a bulge the vaginal area. Patient Denies seeing a bulge.   Bowel Symptom: Bowel movements: 2 time(s) per day Stool consistency: hard, soft , or loose, Type VI-VII mostly, intermittent type IV  Straining: yes.  Splinting: yes.  Incomplete evacuation: yes.  Patient Admits to accidental bowel leakage / fecal incontinence with fecal smearing  Occurs: 2 time(s) per day  Consistency with leakage: soft  Bowel regimen: diet and fiber S/p gallbladder surgery with onset of leakage, denies cholestyramine  use with Rx sent 08/27/24 Last colonoscopy: Results 4 mm tubular adenoma polyp, due for repeat 07/2026 [facility] Colonoscopy          Upcoming      Colonoscopy (Every 7 Years) Next due on 08/16/2026    08/17/2019  COLONOSCOPY  Only the first 1 history entries have been loaded, but more history exists.               Sexual Function Sexually active: no.  Sexual orientation: Straight Pain with sex: Yes, deep in the pelvis alleviated by position changes  Pelvic Pain Admits to pelvic pain Location: midline lower abdomen Pain occurs: during intercourse with pushing on abdomen/bladder Prior pain treatment: birth control pill Improved by: resting, position changes Worsened by: intercourse   Past Medical History:  Past Medical History:  Diagnosis Date   Abnormal Pap smear of cervix    yrs ago   Adenomyosis    Allergy     Anemia    Anxiety    Aortic atherosclerosis    Arthritis    Asthma    Chronic kidney disease    Chronic UTI    Clostridium difficile infection    Depression    Diabetes mellitus without complication (HCC) 10/22/2023   A1C 8.8   Diverticulosis    Endometriosis    Fibroid    Fibromyalgia    GERD (gastroesophageal reflux disease)    Hiatal hernia    Hyperlipidemia    Hypertension    Internal hemorrhoids    Meningitis, viral    Migraines    Pancreatitis    Pure hypercholesterolemia 12/23/2020   Stroke (HCC) 12/2023   Tubular adenoma of colon      Past Surgical History:   Past Surgical History:  Procedure Laterality Date   APPENDECTOMY  1990   CERVICAL BIOPSY  W/ LOOP ELECTRODE EXCISION     LEEP   CESAREAN SECTION  2002   CHOLECYSTECTOMY  2011   COLONOSCOPY  UPPER GASTROINTESTINAL ENDOSCOPY     URINARY SURGERY     urethra sling, removal, and then revision 2016 (4 surgeries)   WISDOM TOOTH EXTRACTION       Past OB/GYN History: OB History  Gravida Para Term Preterm AB Living  4 3 3  0 1 3  SAB IAB Ectopic Multiple Live Births  0 1 0 0 3    # Outcome Date GA Lbr Len/2nd Weight Sex Type Anes PTL Lv  4 IAB           3 Term     F CS-LTranv   LIV  2 Term     M Vag-Forceps    LIV  1 Term     M Vag-Spont   LIV    Vaginal deliveries: largest infant 7lb8oz, perineal laceration repaired with pain for 2-3 years and dyspareunia postpartum. Denies FI postpartum. Forceps/ Vacuum deliveries: 1 forcep, Cesarean section: 1 Menopausal: No, LMP No LMP recorded. Contraception: BTL. Last pap smear.  Any history of abnormal pap smears: yes. H/o LEEP    Component Value Date/Time   DIAGPAP  09/13/2020 0940    - Negative for intraepithelial lesion or malignancy (NILM)   DIAGPAP - Benign reactive/reparative changes 09/13/2020 0940   DIAGPAP  12/25/2016 0000    NEGATIVE FOR INTRAEPITHELIAL LESIONS OR MALIGNANCY.   HPVHIGH Negative 09/13/2020 0940   ADEQPAP  09/13/2020 0940    Satisfactory for evaluation; transformation zone component PRESENT.   ADEQPAP  12/25/2016 0000    Satisfactory for evaluation  endocervical/transformation zone component ABSENT.    Medications: Patient has a current medication list which includes the following prescription(s): albuterol , aspirin  ec, baclofen , accu-chek aviva plus, bupropion , carvedilol , clotrimazole , freestyle libre 3 reader, freestyle libre 3 plus sensor, cyanocobalamin , ec-rx testosterone , baqsimi  one pack, gvoke hypopen  1-pack, freestyle test strips, hydrocortisone , hydroxyzine , insulin  lispro, insulin  pen needle, insulin  pen needle, accu-chek softclix, freestyle, lidocaine , metformin , omeprazole , pregabalin , saccharomyces boulardii, trospium , valacyclovir , azithromycin , and rybelsus .   Allergies: Patient is allergic to amlodipine , atorvastatin , crestor  [rosuvastatin ], interferon beta-1a, irbesartan , losartan, metronidazole , penicillins, xanax [alprazolam], glyburide, linagliptin, and moxifloxacin .   Social History: Social History[1]  Relationship status: single Patient lives alone.   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 Regular exercise: Yes: walking 10K  steps History of abuse: No  Family History:   Family History  Problem Relation Age of Onset   Cancer Mother        cervical, breast, lung, skin, bladder-ex smoker   Hypertension Mother    Renal cancer Mother    Bladder Cancer Mother    Diabetes Father    Heart attack Maternal Grandfather    Multiple sclerosis Paternal Grandmother    Uterine cancer Neg Hx      Review of Systems: Review of Systems  Constitutional:  Negative for fever, malaise/fatigue and weight loss.  Respiratory:  Positive for cough. Negative for shortness of breath and wheezing.   Cardiovascular:  Negative for chest pain, palpitations and leg swelling.  Gastrointestinal:  Positive for abdominal pain. Negative for blood in stool.       Leakage  Genitourinary:  Positive for dysuria, frequency and urgency. Negative for hematuria.       Vaginal discharge, painful menses  Skin:  Negative for rash.  Neurological:  Positive for weakness. Negative for dizziness and headaches.  Endo/Heme/Allergies:  Does not bruise/bleed easily.       Hot flashes  Psychiatric/Behavioral:  Positive for depression. The  patient is nervous/anxious.      OBJECTIVE Physical Exam: Vitals:   10/17/24 1110  BP: 122/81  Pulse: 69  Weight: 159 lb 9.6 oz (72.4 kg)  Height: 5' 3.58 (1.615 m)    Physical Exam Constitutional:      General: She is not in acute distress.    Appearance: Normal appearance.  Genitourinary:     Bladder and urethral meatus normal.     No lesions in the vagina.     Genitourinary Comments: Pain with palpation of puborectalis and over coccyx, limited left hip abduction and range of motion     Right Labia: No rash, tenderness, lesions, skin changes or Bartholin's cyst.    Left Labia: No tenderness, lesions, skin changes, Bartholin's cyst or rash.       No vaginal discharge, erythema, tenderness, bleeding, ulceration or granulation tissue.     Anterior and posterior vaginal prolapse present.    No vaginal  atrophy present.     Right Adnexa: not tender, not full and no mass present.    Left Adnexa: not tender, not full and no mass present.    No cervical motion tenderness, discharge, friability, lesion, polyp or nabothian cyst.     Uterus is not enlarged, fixed, tender or irregular.     No uterine mass detected.    Urethral meatus caruncle not present.    No urethral prolapse, tenderness, mass, hypermobility, discharge or stress urinary incontinence with cough stress test present.     Bladder is not tender, urgency on palpation not present and masses not present.      Pelvic Floor: Levator muscle strength is 4/5.    Levator ani is tender (right sided) and obturator internus is tender (right sided).     No asymmetrical contractions present and no pelvic spasms present.    Asymmetrical pelvic sensation.     Anal wink present and BC reflex present. Cardiovascular:     Rate and Rhythm: Normal rate.  Pulmonary:     Effort: Pulmonary effort is normal. No respiratory distress.  Abdominal:     General: There is no distension.     Palpations: Abdomen is soft. There is no mass.     Tenderness: There is abdominal tenderness.     Hernia: No hernia is present.   Neurological:     Mental Status: She is alert.  Vitals reviewed. Exam conducted with a chaperone present.      POP-Q:   POP-Q  -2                                            Aa   -2                                           Ba  -9                                              C   3  Gh  5                                            Pb  10                                            tvl   -1                                            Ap  -1                                            Bp  -8                                              D     Post-Void Residual (PVR) by Bladder Scan: In order to evaluate bladder emptying, we discussed obtaining a postvoid residual and patient  agreed to this procedure.  Procedure: The ultrasound unit was placed on the patient's abdomen in the suprapubic region after the patient had voided.    Post Void Residual - 10/17/24 1201       Post Void Residual   Post Void Residual 55 mL         Straight Catheterization Procedure for PVR: After verbal consent was obtained from the patient for catheterization to assess bladder emptying and residual volume the urethra and surrounding tissues were prepped with betadine and an in and out catheterization was performed.  PVR was 40mL.  Urine appeared neon yellow from supplementation. The patient tolerated the procedure well.   Laboratory Results: Lab Results  Component Value Date   COLORU yellow 10/17/2024   CLARITYU clear 10/17/2024   GLUCOSEUR negative 10/17/2024   BILIRUBINUR NEGATIVE 10/17/2024   KETONESU Negative 08/22/2024   SPECGRAV 1.025 10/17/2024   RBCUR trace-lysed (A) 10/17/2024   PHUR 5.5 10/17/2024   PROTEINUR NEGATIVE 10/17/2024   UROBILINOGEN 0.2 10/17/2024   LEUKOCYTESUR NEGATIVE 10/17/2024    Lab Results  Component Value Date   CREATININE 0.80 08/10/2024   CREATININE 0.86 07/26/2024   CREATININE 0.80 07/26/2024    Lab Results  Component Value Date   HGBA1C 5.2 08/22/2024    Lab Results  Component Value Date   HGB 11.3 (L) 08/10/2024     ASSESSMENT AND PLAN Amber Rose is a 48 y.o. with:  1. Mixed stress and urge urinary incontinence   2. Nocturia   3. Tobacco abuse   4. Dyspareunia in female   5. History of pelvic surgery   6. History of gross hematuria   7. Incontinence of feces, unspecified fecal incontinence type   8. Abnormal uterine bleeding (AUB)   9. Cerebrovascular accident (CVA), unspecified mechanism (HCC)   10. Yeast vaginitis     Mixed stress and urge urinary incontinence Assessment & Plan: - POCT UA + heme, pending catheterize UA microscopy, culture with ureplasma testing. PVR  40mL - stress > urgency s/p transobturator  sling excision and CVA - discussed urodynamic testing needed due to history of pelvic surgery and refractory symptoms - For treatment of stress urinary incontinence,  non-surgical options include expectant management, weight loss, physical therapy, as well as a pessary.  Surgical options include a midurethral sling, Burch urethropexy, and transurethral injection of a bulking agent. - referral to pelvic floor PT - pt desires to avoid surgical intervention and mesh use, encouraged to consider pessary fitting - h/o transobturator sling placement and removal, failed botox 200U, Vesicare, Trospium , toviaz, Mybetriq - cystoscopy with small bladder capacity by Dr. Chavin in the past - We discussed the symptoms of overactive bladder (OAB), which include urinary urgency, urinary frequency, nocturia, with or without urge incontinence.  While we do not know the exact etiology of OAB, several treatment options exist. We discussed management including behavioral therapy (decreasing bladder irritants, urge suppression strategies, timed voids, bladder retraining), physical therapy, medication; for refractory cases posterior tibial nerve stimulation, sacral neuromodulation, and intravesical botulinum toxin injection.  For anticholinergic medications, we discussed the potential side effects of anticholinergics including dry eyes, dry mouth, constipation, cognitive impairment and urinary retention. For Beta-3 agonist medication, we discussed the potential side effect of elevated blood pressure which is more likely to occur in individuals with uncontrolled hypertension. 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 For irritative bladder we reviewed treatment options including altering her diet to avoid irritative  beverages and foods as well as attempting to decrease stress and other exacerbating factors.  We also discussed using pyridium and similar over-the-counter medications for pain relief as needed. We discussed the medications including Tums, an antihistamine such as Vistaril , amitriptyline, and L-arginine.  We also discussed in-office bladder instillations for pain flares, as well as cystoscopy with hydrodistention in the operating room, which can be both diagnostic and therapeutic.   Orders: -     POCT URINALYSIS DIP (CLINITEK) -     AMB referral to rehabilitation -     POCT URINALYSIS DIP (CLINITEK) -     Trospium  Chloride; Take 1 tablet (20 mg total) by mouth 2 (two) times daily.  Dispense: 60 tablet; Refill: 2  Nocturia Assessment & Plan: - avoid fluid intake 3 hours before bedtime - elevated feet during the day or use compression socks to reduce lower extremity swelling - trial of trospium  20mg  twice daily   Orders: -     POCT URINALYSIS DIP (CLINITEK) -     AMB referral to rehabilitation  Tobacco abuse Assessment & Plan: - history of rUTIs and BV after intercourse - discussed association of tobacco use and recurrent vaginal infection - encouraged tobacco cessation   Dyspareunia in female Assessment & Plan: - h/o CVA with R hemiparesis, limited ROM of L hip, coccydynia and left sided pelvic floor myofascial pain in the setting of fibromyalgia - pain with deep penetration alleviated by position changes - The origin of pelvic floor muscle spasm can be multifactorial, including primary, reactive to a different pain source, trauma, or even part of a centralized pain syndrome.Treatment options include pelvic floor physical therapy, local (vaginal) or oral  muscle relaxants, pelvic muscle trigger point injections or centrally acting pain medications.   - referral to pelvic floor PT - Nuswab to rule out infectious etiology and urine  testing to r/o ureaplasma - consider Rx topical  lidocaine  1g PRN pain up to 3x/day or Amitriptyline 2.5%/ gabapentin  2.5%/ baclofen  2.5% in vaginal cream - discussed conservative management options with cold compress after intercourse  - encouraged position change and lubrication use during intercourse - consider treatment for DIV due to history of recurrent BV, negative Nuswab 09/27/24, 08/22/24, 05/29/24, 01/06/24 with repeat pending  Orders: -     AMB referral to rehabilitation -     Cervicovaginal ancillary only  History of pelvic surgery Assessment & Plan: - transobturator sling 02/05/15 by Dr. Chavin - s/p excision of mesh 04/01/15 with concomitant botox 200u injection, 05/27/15 and 07/09/15 - 09/24/15 radical excision by Dr. Cordella Libel, [facility] at [facility] - will need urodynamics prior to additional treatment - pt desires to avoid foreign material and surgical intervention - PVR 40mL by catheterization   History of gross hematuria Assessment & Plan:  >>ASSESSMENT AND PLAN FOR ABNORMAL URINALYSIS WRITTEN ON 10/17/2024 11:18 PM BY Somaly Marteney T, MD  - POCT UA + heme, pending catheterized UA microscopy and culture with ureaplasma testing. PVR 55mL - encouraged tobacco cessation - For management of gross hematuria, we discussed the importance of work-up including assessing the upper and lower GU tract with CT urogram and cystoscopy.  - Cr 0.8 in 08/10/24 - CT abd/pelvis w contrast 07/26/24 without renal calculi, focal lesion, or hydronephrosis noted - office to schedule cystoscopy   Orders: -     Urine Culture; Future -     Urinalysis, Complete w Microscopic; Future -     Cervicovaginal ancillary only  Incontinence of feces, unspecified fecal incontinence type Assessment & Plan: - encouraged to start cholestyramine   - diarrhea improved after discontinuation of Ozempic  - Treatment options include anti-diarrhea medication (loperamide/ Imodium OTC or prescription lomotil), fiber supplements, physical therapy, and possible  sacral neuromodulation or surgery.   - referral to pelvic floor PT - trial of Trospium  20mg  BID   Abnormal uterine bleeding (AUB) Assessment & Plan: - encouraged follow-up with Dr. Alger due to persistent AUB and encouraged to proceed with D&C due to persistent symptoms - h/o IV iron  transfusion due to anemia, last Hgb 11.3 in 08/10/24 - previously declined IUD,  - h/o fibroids with TVUS 11/18/23 that showed possible polyp and ES 7.82mm with some vascularity  - EMB 07/29/23 by Dr. Cleotilde with benign endometrium with progestational effect. Negative for polyp, breakdown, atypia, hyperplasia, and carcinoma.   Cerebrovascular accident (CVA), unspecified mechanism (HCC) Assessment & Plan: - discussed increased risk of neurogenic bladder - discussed need for urodynamic testing due to increased risk of refractory urinary symptoms and abnormal compliance - reassess symptoms after pelvic floor PT   Yeast vaginitis Assessment & Plan: - consider treatment for DIV due to history of recurrent BV, negative Nuswab 09/27/24, 08/22/24, 05/29/24, 01/06/24 with repeat pending - continue to monitor glucose control with last HbA1C 5.2 in 08/22/24   Time spent: I spent 71 minutes dedicated to the care of this patient on the date of this encounter to include pre-visit review of records, face-to-face time with the patient discussing mixed urinary incontinence, nocturia, tobacco use, history of gross hematuria, fecal incontinence, abnormal uterine bleeding, h/o CVA, recurrent vaginitis, and post visit documentation and ordering medication/ testing.   Lianne ONEIDA Gillis, MD        [1]  Social History Tobacco Use   Smoking status: Some Days    Current packs/day: 0.10    Average packs/day: 0.1 packs/day  for 20.0 years (2.0 ttl pk-yrs)    Types: Cigarettes   Smokeless tobacco: Never   Tobacco comments:    Patient is engaged in health coaching for smoking cessation as of 12/26/20. Patient stated that she has  stopped smoking, drinking and any form of drug usage since her stroke.  Vaping Use   Vaping status: Never Used  Substance Use Topics   Alcohol use: Yes    Comment: occ   Drug use: Yes    Types: Marijuana    Comment: occ   "

## 2024-10-17 NOTE — Assessment & Plan Note (Signed)
-   POCT UA + heme, pending catheterized UA microscopy and culture. PVR 55mL - tobacco use - For management of gross hematuria, we discussed the importance of work-up including assessing the upper and lower GU tract with CT urogram and cystoscopy.  - Cr 0.8 in 08/10/24 - CT abd/pelvis w contrast 07/26/24 without renal calculi, focal lesion, or hydronephrosis noted - office to schedule cystoscopy

## 2024-10-17 NOTE — Assessment & Plan Note (Addendum)
-   encouraged to start cholestyramine   - diarrhea improved after discontinuation of Ozempic  - Treatment options include anti-diarrhea medication (loperamide/ Imodium OTC or prescription lomotil), fiber supplements, physical therapy, and possible sacral neuromodulation or surgery.   - referral to pelvic floor PT - trial of Trospium  20mg  BID

## 2024-10-17 NOTE — Assessment & Plan Note (Addendum)
-   consider treatment for DIV due to history of recurrent BV, negative Nuswab 09/27/24, 08/22/24, 05/29/24, 01/06/24 with repeat pending - continue to monitor glucose control with last HbA1C 5.2 in 08/22/24

## 2024-10-18 LAB — URINE CULTURE: Culture: NO GROWTH

## 2024-10-20 ENCOUNTER — Ambulatory Visit: Payer: Self-pay | Admitting: Obstetrics

## 2024-10-23 ENCOUNTER — Encounter: Payer: Self-pay | Admitting: "Endocrinology

## 2024-10-23 ENCOUNTER — Telehealth: Admitting: "Endocrinology

## 2024-10-23 VITALS — Ht 63.0 in | Wt 159.0 lb

## 2024-10-23 DIAGNOSIS — E11649 Type 2 diabetes mellitus with hypoglycemia without coma: Secondary | ICD-10-CM | POA: Diagnosis not present

## 2024-10-23 DIAGNOSIS — Z7984 Long term (current) use of oral hypoglycemic drugs: Secondary | ICD-10-CM | POA: Diagnosis not present

## 2024-10-23 DIAGNOSIS — E7800 Pure hypercholesterolemia, unspecified: Secondary | ICD-10-CM

## 2024-10-23 MED ORDER — REPAGLINIDE 1 MG PO TABS: 1.0000 mg | ORAL_TABLET | Freq: Three times a day (TID) | ORAL | 3 refills | Status: AC | PRN

## 2024-10-23 MED ORDER — BLOOD GLUCOSE TEST VI STRP: 1.0000 | ORAL_STRIP | Freq: Three times a day (TID) | 3 refills | Status: AC

## 2024-10-23 NOTE — Patient Instructions (Signed)

## 2024-10-23 NOTE — Progress Notes (Signed)
 "  The patient reports they are currently: Amber Rose. I spent 11 minutes on the video with the patient on the date of service. I spent an additional 5 minutes on pre- and post-visit activities on the date of service.   The patient was physically located in [place]  or a state in which I am permitted to provide care. The patient and/or parent/guardian understood that s/he may incur co-pays and cost sharing, and agreed to the telemedicine visit. The visit was reasonable and appropriate under the circumstances given the patient's presentation at the time.  The patient and/or parent/guardian understands the potential risks and limitations of this mode of treatment (including, but not limited to, the absence of in-person examination) and has agreed to be treated using telemedicine. The patient's/patient's family's questions regarding telemedicine have been answered.   The patient and/or parent/guardian will contact their provider's office for worsening conditions, and seek emergency medical treatment and/or call 911 if the patient deems either necessary.    Outpatient Endocrinology Note Amber Birmingham, MD  10/23/2024   Amber Rose 06/08/1976 [id number]  Referring Provider: Delbert Clam, MD Primary Care Provider: Delbert Clam, MD Reason for consultation: Subjective   Assessment & Plan  Diagnoses and all orders for this visit:  Uncontrolled type 2 diabetes mellitus with hypoglycemia without coma (HCC)  Long term (current) use of oral hypoglycemic drugs  Pure hypercholesterolemia  Other orders -     repaglinide  (PRANDIN ) 1 MG tablet; Take 1 tablet (1 mg total) by mouth 3 (three) times daily as needed (for blood sugar >220). -     Glucose Blood (BLOOD GLUCOSE TEST STRIPS) STRP; 1 each by In Vitro route in the morning, at noon, and at bedtime. May substitute to any manufacturer covered by patient's insurance.   Diabetes Type II complicated by hyperglycemia,  Lab Results   Component Value Date   GFR 86.95 07/26/2024   Hba1c goal less than 7, current Hba1c is  Lab Results  Component Value Date   HGBA1C 5.2 08/22/2024   Will recommend the following: Metformin  500 mg 2 tabs bid Repaglinide  1 mg PRN BG >220  Stop libre as patient finds it misleading  Stop Rybelsus  14 mg qam given low BG and poor appetite 06/29/24: Stop Ozempic  1 mg per week, S/E of abdominal pain, poor appetite, excess weight loss  Stop Humalog  scale: Use it 15 min before you eat: Space it out by 4 hours.  201 - 225: 3 units 226 - 250: 4 units 251 - 275: 5 units 276 - 300: 6 units 301 - 325: 7 units 326 - 350: 8 units 351 - 375: 9 units 376 - 400: 10 units   Ordered DM education previously   No known contraindications/side effects to any of above medications No history of MEN syndrome/medullary thyroid  cancer/pancreatitis or pancreatic cancer in self or family Stopped ozempic  2mg  3 mo ago due to weight loss-lost 30-40 lbs but now wants it again Stopped Lantus  14 units qpm   -Last LD and Tg are as follows: Lab Results  Component Value Date   LDLCALC 177 (H) 05/25/2024    Lab Results  Component Value Date   TRIG 130 05/25/2024   -not on statin, not interested after detailed discussion, says she has the shot approved, scared to take it-counseled pt -Follow low fat diet and exercise   -Blood pressure goal <140/90 - Microalbumin/creatinine goal is < 30 -Last MA/Cr is as follows: Lab Results  Component Value Date   MICROALBUR 1.3 11/22/2023   -  not on ACE/ARB  -diet changes including salt restriction -limit eating outside -counseled BP targets per standards of diabetes care -uncontrolled blood pressure can lead to retinopathy, nephropathy and cardiovascular and atherosclerotic heart disease  Reviewed and counseled on: -A1C target -Blood sugar targets -Complications of uncontrolled diabetes  -Checking blood sugar before meals and bedtime and bring log next visit -All  medications with mechanism of action and side effects -Hypoglycemia management: rule of 15's, Glucagon  Emergency Kit and medical alert ID -low-carb low-fat plate-method diet -At least 20 minutes of physical activity per day -Annual dilated retinal eye exam and foot exam -compliance and follow up needs -follow up as scheduled or earlier if problem gets worse  Call if blood sugar is less than 70 or consistently above 250    Take a 15 gm snack of carbohydrate at bedtime before you go to sleep if your blood sugar is less than 100.    If you are going to fast after midnight for a test or procedure, ask your physician for instructions on how to reduce/decrease your insulin  dose.    Call if blood sugar is less than 70 or consistently above 250  -Treating a low sugar by rule of 15  (15 gms of sugar every 15 min until sugar is more than 70) If you feel your sugar is low, test your sugar to be sure If your sugar is low (less than 70), then take 15 grams of a fast acting Carbohydrate (3-4 glucose tablets or glucose gel or 4 ounces of juice or regular soda) Recheck your sugar 15 min after treating low to make sure it is more than 70 If sugar is still less than 70, treat again with 15 grams of carbohydrate          Don't drive the hour of hypoglycemia  If unconscious/unable to eat or drink by mouth, use glucagon  injection or nasal spray baqsimi  and call 911. Can repeat again in 15 min if still unconscious.  Return in about 3 months (around 01/19/2025).   I have reviewed current medications, nurse's notes, allergies, vital signs, past medical and surgical history, family medical history, and social history for this encounter. Counseled patient on symptoms, examination findings, lab findings, imaging results, treatment decisions and monitoring and prognosis. The patient understood the recommendations and agrees with the treatment plan. All questions regarding treatment plan were fully answered.  Amber Birmingham, MD  10/23/2024  History of Present Illness Amber Rose is a 48 y.o. year old female who presents for follow up of Type II diabetes mellitus.  [name] was first diagnosed in around 2015.   Diabetes education +  Works at night 4 pm -7 am Sleeps [place]-5am, 8:30am-11:30am  8am is BF, lunch is around noon, dinner at 5:30pm and 9 pm is snack   Home diabetes regimen: Metformin  500mg  2 tabs bid Rybelsus  14 mg qam   Stopped Lantus  18 units qpm Stopped ozempic  2mg  3 mo ago due to weight loss-lost 30-40 lbs   COMPLICATIONS -  MI/Stroke -  retinopathy -  neuropathy -  nephropathy  BLOOD SUGAR DATA  CGM interpretation: At today's visit, we reviewed her CGM downloads. The full report is scanned in the media. Reviewing the CGM trends, BG are well controlled across the day with some lows and highs.  Physical Exam  Ht 5' 3 (1.6 m)   Wt 159 lb (72.1 kg)   BMI 28.17 kg/m    Constitutional: well developed, well nourished Head: normocephalic, atraumatic  Eyes: sclera anicteric, no redness Neck: supple Lungs: normal respiratory effort Neurology: alert and oriented Skin: dry, no appreciable rashes Musculoskeletal: no appreciable defects Psychiatric: normal mood and affect Diabetic Foot Exam - Simple   No data filed      Current Medications Patient's Medications  New Prescriptions   GLUCOSE BLOOD (BLOOD GLUCOSE TEST STRIPS) STRP    1 each by In Vitro route in the morning, at noon, and at bedtime. May substitute to any manufacturer covered by patient's insurance.   REPAGLINIDE  (PRANDIN ) 1 MG TABLET    Take 1 tablet (1 mg total) by mouth 3 (three) times daily as needed (for blood sugar >220).  Previous Medications   ALBUTEROL  (PROAIR  HFA) 108 (90 BASE) MCG/ACT INHALER    Inhale 2 puffs into the lungs every 4 (four) hours as needed for wheezing or shortness of breath.   ASPIRIN  EC 81 MG TABLET    Take 1 tablet (81 mg total) by mouth daily. Swallow whole.    AZITHROMYCIN  (ZITHROMAX  Z-PAK) 250 MG TABLET    Take 2 tablets po load, then take 1 tablet po daily.   BACLOFEN  (LIORESAL ) 10 MG TABLET    Take 1 tablet (10 mg total) by mouth 3 (three) times daily as needed for muscle spasms.   BLOOD GLUCOSE MONITORING SUPPL (ACCU-CHEK AVIVA PLUS) W/DEVICE KIT    USE AS DIRECTED DAILY. E11.9   BUPROPION  (WELLBUTRIN  XL) 150 MG 24 HR TABLET    Take 1 tablet (150 mg total) by mouth daily.   CARVEDILOL  (COREG ) 12.5 MG TABLET    Take 1 tablet (12.5 mg total) by mouth 2 (two) times daily.   CLOTRIMAZOLE  (LOTRIMIN ) 1 % CREAM    Apply 1 Application topically 2 (two) times daily.   CONTINUOUS GLUCOSE RECEIVER (FREESTYLE LIBRE 3 READER) DEVI    1 Device by Does not apply route continuous.   CONTINUOUS GLUCOSE SENSOR (FREESTYLE LIBRE 3 PLUS SENSOR) MISC    Inject 1 Device into the skin continuous. Change every 15 days   CYANOCOBALAMIN  (B-12 PO)    Take by mouth.   EC-RX TESTOSTERONE  0.2 % CREA    Place onto the skin as directed.   GLUCAGON  (BAQSIMI  ONE PACK) 3 MG/DOSE POWD    Place 1 Device into the nose as needed (Low blood sugar with impaired consciousness).   GLUCAGON  (GVOKE HYPOPEN  1-PACK) 1 MG/0.2ML SOAJ    Inject 1 mg into the skin as needed (low blood sugar with impaired consciousness).   GLUCOSE BLOOD (FREESTYLE TEST STRIPS) TEST STRIP    USE TO CHECK BLOOD SUGAR THREE TIMES DAILY. E11.49   HYDROCORTISONE  (ANUSOL -HC) 2.5 % RECTAL CREAM    Place 1 Application rectally 2 (two) times daily.   HYDROXYZINE  (ATARAX ) 50 MG TABLET    Take 1 tablet (50 mg total) by mouth 3 (three) times daily as needed.   INSULIN  LISPRO (HUMALOG  KWIKPEN) 100 UNIT/ML KWIKPEN    Inject 1-15 Units into the skin 3 (three) times daily.   INSULIN  PEN NEEDLE 31G X 5 MM MISC    1 each by Does not apply route at bedtime.   INSULIN  PEN NEEDLE 32G X 4 MM MISC    1 Needle by Does not apply route 3 (three) times daily.   LANCET DEVICES (ACCU-CHEK SOFTCLIX) LANCETS    Use as instructed daily.   LANCETS  (FREESTYLE) LANCETS    Use to check blood sugar three times daily. E11.49   LIDOCAINE  (LIDODERM ) 5 %    PLACE 1  PATCH ONTO THE SKIN DAILY. REMOVE & DISCARD PATCH WITHIN 12 HOURS OR AS DIRECTED BY MD   METFORMIN  (GLUCOPHAGE ) 500 MG TABLET    TAKE 2 TABLETS (1,000 MG TOTAL) BY MOUTH 2 (TWO) TIMES DAILY WITH A MEAL.   OMEPRAZOLE  (PRILOSEC) 40 MG CAPSULE    TAKE 1 CAPSULE BY MOUTH EVERY  MORNING   PREGABALIN  (LYRICA ) 100 MG CAPSULE    TAKE 1 CAPSULE BY MOUTH TWICE A DAY   SACCHAROMYCES BOULARDII (FLORASTOR) 250 MG CAPSULE    Take 1 capsule (250 mg total) by mouth 2 (two) times daily.   SEMAGLUTIDE  (RYBELSUS ) 14 MG TABS    Take 1 tablet (14 mg total) by mouth daily.   TROSPIUM  (SANCTURA ) 20 MG TABLET    Take 1 tablet (20 mg total) by mouth 2 (two) times daily.   VALACYCLOVIR  (VALTREX ) 500 MG TABLET    TAKE 1 TABLET (500 MG TOTAL) BY MOUTH 2 (TWO) TIMES DAILY. FOR HERPES PROPHYLAXIS  Modified Medications   No medications on file  Discontinued Medications   No medications on file    Allergies Allergies  Allergen Reactions   Amlodipine      Leg pain, swelling around eyes    Atorvastatin  Other (See Comments)     Joint pain   Crestor  [Rosuvastatin ]     Joint pain    Interferon Beta-1a Other (See Comments)   Irbesartan      Leg pain, swelling around eyes   Losartan Other (See Comments)   Metronidazole  Nausea And Vomiting   Penicillins Other (See Comments)    childhood   Xanax [Alprazolam]     Caused upper respiratory symptoms per pt   Glyburide Other (See Comments)    blood sugar dropped uncontrollably   Linagliptin Diarrhea and Other (See Comments)    Stomach pain, sinus infection   Moxifloxacin  Diarrhea    Past Medical History Past Medical History:  Diagnosis Date   Abnormal Pap smear of cervix    yrs ago   Adenomyosis    Allergy     Anemia    Anxiety    Aortic atherosclerosis    Arthritis    Asthma    Chronic kidney disease    Chronic UTI    Clostridium difficile  infection    Depression    Diabetes mellitus without complication (HCC) 10/22/2023   A1C 8.8   Diverticulosis    Endometriosis    Fibroid    Fibromyalgia    GERD (gastroesophageal reflux disease)    Hiatal hernia    Hyperlipidemia    Hypertension    Internal hemorrhoids    Meningitis, viral    Migraines    Pancreatitis    Pure hypercholesterolemia 12/23/2020   Stroke (HCC) 12/2023   Tubular adenoma of colon     Past Surgical History Past Surgical History:  Procedure Laterality Date   APPENDECTOMY  1990   CERVICAL BIOPSY  W/ LOOP ELECTRODE EXCISION     LEEP   CESAREAN SECTION  2002   CHOLECYSTECTOMY  2011   COLONOSCOPY     UPPER GASTROINTESTINAL ENDOSCOPY     URINARY SURGERY     urethra sling, removal, and then revision 2016 (4 surgeries)   WISDOM TOOTH EXTRACTION      Family History family history includes Bladder Cancer in her mother; Cancer in her mother; Diabetes in her father; Heart attack in her maternal grandfather; Hypertension in her mother; Multiple sclerosis in her paternal grandmother; Renal cancer in her mother.  Social History Social History  Socioeconomic History   Marital status: Single    Spouse name: Not on file   Number of children: 3   Years of education: Not on file   Highest education level: Not on file  Occupational History   Not on file  Tobacco Use   Smoking status: Some Days    Current packs/day: 0.10    Average packs/day: 0.1 packs/day for 20.0 years (2.0 ttl pk-yrs)    Types: Cigarettes   Smokeless tobacco: Never   Tobacco comments:    Patient is engaged in health coaching for smoking cessation as of 12/26/20. Patient stated that she has stopped smoking, drinking and any form of drug usage since her stroke.  Vaping Use   Vaping status: Never Used  Substance and Sexual Activity   Alcohol use: Yes    Comment: occ   Drug use: Yes    Types: Marijuana    Comment: occ   Sexual activity: Not Currently    Partners: Male    Birth  control/protection: Surgical    Comment: BTL  Other Topics Concern   Not on file  Social History Narrative   Lives home with adult niece.  Not working.  Disability pending.  Pt is single.  Education GED.  3 children.    Social Drivers of Health   Tobacco Use: High Risk (10/23/2024)   Patient History    Smoking Tobacco Use: Some Days    Smokeless Tobacco Use: Never    Passive Exposure: Not on file  Financial Resource Strain: Low Risk (01/25/2024)   Overall Financial Resource Strain (CARDIA)    Difficulty of Paying Living Expenses: Not very hard  Food Insecurity: Food Insecurity Present (01/25/2024)   Hunger Vital Sign    Worried About Running Out of Food in the Last Year: Sometimes true    Ran Out of Food in the Last Year: Sometimes true  Transportation Needs: No Transportation Needs (01/25/2024)   PRAPARE - Administrator, Civil Service (Medical): No    Lack of Transportation (Non-Medical): No  Physical Activity: Insufficiently Active (01/25/2024)   Exercise Vital Sign    Days of Exercise per Week: 3 days    Minutes of Exercise per Session: 30 min  Stress: Stress Concern Present (01/25/2024)   [employer] of Occupational Health - Occupational Stress Questionnaire    Feeling of Stress : Rather much  Social Connections: Socially Isolated (01/25/2024)   Social Connection and Isolation Panel    Frequency of Communication with Friends and Family: Three times a week    Frequency of Social Gatherings with Friends and Family: Once a week    Attends Religious Services: Never    Database Administrator or Organizations: No    Attends Banker Meetings: Never    Marital Status: Never married  Intimate Partner Violence: Not At Risk (01/25/2024)   Humiliation, Afraid, Rape, and Kick questionnaire    Fear of Current or Ex-Partner: No    Emotionally Abused: No    Physically Abused: No    Sexually Abused: No  Depression (PHQ2-9): High Risk (04/19/2024)   Depression  (PHQ2-9)    PHQ-2 Score: 13  Alcohol Screen: Low Risk (01/25/2024)   Alcohol Screen    Last Alcohol Screening Score (AUDIT): 0  Housing: High Risk (01/25/2024)   Housing Stability Vital Sign    Unable to Pay for Housing in the Last Year: Yes    Number of Times Moved in the Last Year: 0  Homeless in the Last Year: No  Utilities: Not At Risk (01/25/2024)   AHC Utilities    Threatened with loss of utilities: No  Health Literacy: Adequate Health Literacy (01/25/2024)   B1300 Health Literacy    Frequency of need for help with medical instructions: Rarely    Lab Results  Component Value Date   HGBA1C 5.2 08/22/2024   HGBA1C 6.7 (A) 04/13/2024   HGBA1C 6.4 (H) 01/08/2024   Lab Results  Component Value Date   CHOL 251 (H) 05/25/2024   Lab Results  Component Value Date   HDL 51 05/25/2024   Lab Results  Component Value Date   LDLCALC 177 (H) 05/25/2024   Lab Results  Component Value Date   TRIG 130 05/25/2024   Lab Results  Component Value Date   CHOLHDL 4.9 (H) 05/25/2024   Lab Results  Component Value Date   CREATININE 0.80 08/10/2024   Lab Results  Component Value Date   GFR 86.95 07/26/2024   Lab Results  Component Value Date   MICROALBUR 1.3 11/22/2023      Component Value Date/Time   NA 139 08/10/2024 0913   NA 140 05/05/2024 1456   K 4.1 08/10/2024 0913   CL 109 08/10/2024 0913   CO2 27 08/10/2024 0913   GLUCOSE 107 (H) 08/10/2024 0913   BUN 15 08/10/2024 0913   BUN 17 05/05/2024 1456   CREATININE 0.80 08/10/2024 0913   CREATININE 0.70 11/22/2023 0950   CALCIUM  8.9 08/10/2024 0913   PROT 6.2 (L) 08/10/2024 0913   PROT 6.6 01/20/2024 1630   ALBUMIN 3.6 08/10/2024 0913   ALBUMIN 4.1 01/20/2024 1630   AST 17 08/10/2024 0913   ALT 21 08/10/2024 0913   ALKPHOS 60 08/10/2024 0913   BILITOT 0.2 08/10/2024 0913   GFRNONAA >60 08/10/2024 0913   GFRNONAA >89 10/28/2016 0851   GFRAA 127 12/12/2020 1149   GFRAA >89 10/28/2016 0851      Latest Ref Rng &  Units 08/10/2024    9:13 AM 07/26/2024   12:52 PM 07/26/2024   11:40 AM  BMP  Glucose 70 - 99 mg/dL 892  868  90   BUN 6 - 20 mg/dL 15  18  17    Creatinine 0.44 - 1.00 mg/dL 9.19  9.13  9.19   Sodium 135 - 145 mmol/L 139  137  133   Potassium 3.5 - 5.1 mmol/L 4.1  4.0  4.1   Chloride 98 - 111 mmol/L 109  102  101   CO2 22 - 32 mmol/L 27  22  23    Calcium  8.9 - 10.3 mg/dL 8.9  9.6  9.2        Component Value Date/Time   WBC 9.4 08/10/2024 0913   WBC 11.3 (H) 07/26/2024 1252   RBC 3.53 (L) 08/10/2024 0913   HGB 11.3 (L) 08/10/2024 0913   HGB 15.0 01/20/2024 1630   HCT 33.0 (L) 08/10/2024 0913   HCT 44.9 01/20/2024 1630   PLT 345 08/10/2024 0913   PLT 330 01/20/2024 1630   MCV 93.5 08/10/2024 0913   MCV 95 01/20/2024 1630   MCH 32.0 08/10/2024 0913   MCHC 34.2 08/10/2024 0913   RDW 14.5 08/10/2024 0913   RDW 12.7 01/20/2024 1630   LYMPHSABS 1.6 08/10/2024 0913   LYMPHSABS 2.5 01/20/2024 1630   MONOABS 0.6 08/10/2024 0913   EOSABS 0.2 08/10/2024 0913   EOSABS 0.1 01/20/2024 1630   BASOSABS 0.1 08/10/2024 0913   BASOSABS 0.1 01/20/2024 1630  Parts of this note may have been dictated using voice recognition software. There may be variances in spelling and vocabulary which are unintentional. Not all errors are proofread. Please notify the dino if any discrepancies are noted or if the meaning of any statement is not clear.   "

## 2024-10-25 ENCOUNTER — Other Ambulatory Visit: Payer: Self-pay | Admitting: Family Medicine

## 2024-10-25 DIAGNOSIS — F322 Major depressive disorder, single episode, severe without psychotic features: Secondary | ICD-10-CM

## 2024-10-30 ENCOUNTER — Encounter: Payer: Self-pay | Admitting: Internal Medicine

## 2024-10-31 ENCOUNTER — Ambulatory Visit: Admitting: Physician Assistant

## 2024-10-31 NOTE — Progress Notes (Deleted)
 "     Amber Console, PA-C [address] [place], [place]  [address] Phone: [phone]   Primary Care Physician: Delbert Clam, MD  Primary Gastroenterologist:  Amber Console, PA-C / Dr. Gordy Starch   Chief Complaint: Follow-up    HPI:   Discussed the use of AI scribe software for clinical note transcription with the patient, who gave verbal consent to proceed.  A last saw patient 07/26/2024 for external hemorrhoids, rectal bleeding, generalized abdominal pain, nausea, diarrhea, constipation, weight loss.  History of adenomatous colon polyps and a 7-year repeat colonoscopy will be due 07/2026.  She was advised to remain off Ozempic .  Continued on omeprazole  40 mg daily.  Given hydrocortisone  2.5% cream for hemorrhoids.  Started on fiber supplement daily.  She was having uncontrolled hypertension with history of CVA 12/2023.  She was instructed to follow-up with PCP ASAP for management of uncontrolled hypertension.  07/2024 labs (CBC, CMP, lipase, CRP, celiac, TSH): Negative celiac.  Normal hemoglobin 13.8.  Mildly elevated white count 11.3.  Negative hCG.  Normal CMP, CRP, TSH, and lipase.  08/10/24 labs showed low iron  24, iron  saturation 7%.  Iron  deficiency.  Normal vitamin B12.  07/2024: C. difficile negative.  Normal fecal calprotectin.  Normal fecal pancreatic elastase.  She was started on cholestyramine  powder for possible bile salt diarrhea postcholecystectomy.  Also thought probably had diarrhea from metformin . History of Present Illness      Patient had a stroke March 2025.  She was started on a lot of new medications after that including 81 mg aspirin  daily.    She has been unable to tolerate multiple blood pressure medications.  She has follow-up with cardiology and hypertension specialist.   Patient last saw Con Blower in our office 07/2023.  She has history of probable gastroparesis, GERD, uncontrolled diabetes, esophageal dysphagia responsive to prior dilation,  C. difficile colitis, hypertension, hyperlipidemia.  Current tobacco and marijuana use.   08/2023 alpha gal negative.  ANA negative.  Fecal pancreatic elastase greater than 800, normal.   07/2019 colonoscopy (for change in bowel habits): 1 small 4 mm tubular adenoma polyp removed.  Otherwise normal.  7-year repeat (due 07/2026).   07/2019 EGD (for epigastric pain, dysphagia, nausea): Duodenitis, otherwise normal.  Stomach and esophagus normal.  Biopsies negative for H. pylori.   02/2022 CT abdomen pelvis with contrast (for LLQ pain): Moderate stool throughout the colon.  Constipation.  S/p cholecystectomy.  Otherwise normal.  Current Outpatient Medications  Medication Sig Dispense Refill   albuterol  (PROAIR  HFA) 108 (90 Base) MCG/ACT inhaler Inhale 2 puffs into the lungs every 4 (four) hours as needed for wheezing or shortness of breath. 3 Inhaler 0   aspirin  EC 81 MG tablet Take 1 tablet (81 mg total) by mouth daily. Swallow whole. 30 tablet 12   azithromycin  (ZITHROMAX  Z-PAK) 250 MG tablet Take 2 tablets po load, then take 1 tablet po daily. 6 each 0   baclofen  (LIORESAL ) 10 MG tablet Take 1 tablet (10 mg total) by mouth 3 (three) times daily as needed for muscle spasms. 90 each 11   Blood Glucose Monitoring Suppl (ACCU-CHEK AVIVA PLUS) w/Device KIT USE AS DIRECTED DAILY. E11.9 1 kit 0   buPROPion  (WELLBUTRIN  XL) 150 MG 24 hr tablet TAKE 1 TABLET BY MOUTH EVERY DAY 90 tablet 1   carvedilol  (COREG ) 12.5 MG tablet Take 1 tablet (12.5 mg total) by mouth 2 (two) times daily. 180 tablet 3   clotrimazole  (LOTRIMIN ) 1 % cream Apply  1 Application topically 2 (two) times daily. 60 g 2   Continuous Glucose Receiver (FREESTYLE LIBRE 3 READER) DEVI 1 Device by Does not apply route continuous. 1 each 0   Continuous Glucose Sensor (FREESTYLE LIBRE 3 PLUS SENSOR) MISC Inject 1 Device into the skin continuous. Change every 15 days 6 each 3   Cyanocobalamin  (B-12 PO) Take by mouth.     EC-RX Testosterone  0.2  % CREA Place onto the skin as directed.     Glucagon  (BAQSIMI  ONE PACK) 3 MG/DOSE POWD Place 1 Device into the nose as needed (Low blood sugar with impaired consciousness). 2 each 3   Glucagon  (GVOKE HYPOPEN  1-PACK) 1 MG/0.2ML SOAJ Inject 1 mg into the skin as needed (low blood sugar with impaired consciousness). 0.4 mL 2   Glucose Blood (BLOOD GLUCOSE TEST STRIPS) STRP 1 each by In Vitro route in the morning, at noon, and at bedtime. May substitute to any manufacturer covered by patient's insurance. 100 each 3   glucose blood (FREESTYLE TEST STRIPS) test strip USE TO CHECK BLOOD SUGAR THREE TIMES DAILY. E11.49 100 strip 6   hydrocortisone  (ANUSOL -HC) 2.5 % rectal cream Place 1 Application rectally 2 (two) times daily. 30 g 1   hydrOXYzine  (ATARAX ) 50 MG tablet Take 1 tablet (50 mg total) by mouth 3 (three) times daily as needed. 30 tablet 5   insulin  lispro (HUMALOG  KWIKPEN) 100 UNIT/ML KwikPen Inject 1-15 Units into the skin 3 (three) times daily. 43 mL 1   Insulin  Pen Needle 31G X 5 MM MISC 1 each by Does not apply route at bedtime. 30 each 5   Insulin  Pen Needle 32G X 4 MM MISC 1 Needle by Does not apply route 3 (three) times daily. 200 each 5   Lancet Devices (ACCU-CHEK SOFTCLIX) lancets Use as instructed daily. 1 each 5   Lancets (FREESTYLE) lancets Use to check blood sugar three times daily. E11.49 100 each 3   lidocaine  (LIDODERM ) 5 % PLACE 1 PATCH ONTO THE SKIN DAILY. REMOVE & DISCARD PATCH WITHIN 12 HOURS OR AS DIRECTED BY MD 30 patch 1   metFORMIN  (GLUCOPHAGE ) 500 MG tablet TAKE 2 TABLETS (1,000 MG TOTAL) BY MOUTH 2 (TWO) TIMES DAILY WITH A MEAL. 360 tablet 1   omeprazole  (PRILOSEC) 40 MG capsule TAKE 1 CAPSULE BY MOUTH EVERY  MORNING 100 capsule 0   pregabalin  (LYRICA ) 100 MG capsule TAKE 1 CAPSULE BY MOUTH TWICE A DAY 60 capsule 3   repaglinide  (PRANDIN ) 1 MG tablet Take 1 tablet (1 mg total) by mouth 3 (three) times daily as needed (for blood sugar >220). 90 tablet 3   saccharomyces  boulardii (FLORASTOR) 250 MG capsule Take 1 capsule (250 mg total) by mouth 2 (two) times daily. 60 capsule 0   Semaglutide  (RYBELSUS ) 14 MG TABS Take 1 tablet (14 mg total) by mouth daily. 90 tablet 1   trospium  (SANCTURA ) 20 MG tablet Take 1 tablet (20 mg total) by mouth 2 (two) times daily. 60 tablet 2   valACYclovir  (VALTREX ) 500 MG tablet TAKE 1 TABLET (500 MG TOTAL) BY MOUTH 2 (TWO) TIMES DAILY. FOR HERPES PROPHYLAXIS 180 tablet 1   No current facility-administered medications for this visit.    Allergies as of 10/31/2024 - Review Complete 10/23/2024  Allergen Reaction Noted   Amlodipine   08/14/2022   Atorvastatin  Other (See Comments) 12/12/2020   Crestor  [rosuvastatin ]  01/01/2021   Interferon beta-1a Other (See Comments) 07/26/2024   Irbesartan   08/14/2022   Losartan Other (See Comments)  10/17/2024   Metronidazole  Nausea And Vomiting 08/26/2018   Penicillins Other (See Comments) 10/12/2016   Xanax [alprazolam]  10/12/2016   Glyburide Other (See Comments) 02/04/2015   Linagliptin Diarrhea and Other (See Comments) 07/18/2014   Moxifloxacin  Diarrhea 04/10/2014    Past Medical History:  Diagnosis Date   Abnormal Pap smear of cervix    yrs ago   Adenomyosis    Allergy     Anemia    Anxiety    Aortic atherosclerosis    Arthritis    Asthma    Chronic kidney disease    Chronic UTI    Clostridium difficile infection    Depression    Diabetes mellitus without complication (HCC) 10/22/2023   A1C 8.8   Diverticulosis    Endometriosis    Fibroid    Fibromyalgia    GERD (gastroesophageal reflux disease)    Hiatal hernia    Hyperlipidemia    Hypertension    Internal hemorrhoids    Meningitis, viral    Migraines    Pancreatitis    Pure hypercholesterolemia 12/23/2020   Stroke (HCC) 12/2023   Tubular adenoma of colon     Past Surgical History:  Procedure Laterality Date   APPENDECTOMY  1990   CERVICAL BIOPSY  W/ LOOP ELECTRODE EXCISION     LEEP   CESAREAN SECTION   2002   CHOLECYSTECTOMY  2011   COLONOSCOPY     UPPER GASTROINTESTINAL ENDOSCOPY     URINARY SURGERY     urethra sling, removal, and then revision 2016 (4 surgeries)   WISDOM TOOTH EXTRACTION      Review of Systems:    All systems reviewed and negative except where noted in HPI.    Physical Exam:  There were no vitals taken for this visit. No LMP recorded.  General: Well-nourished, well-developed in no acute distress.  Lungs: Clear to auscultation bilaterally. Non-labored. Heart: Regular rate and rhythm, no murmurs rubs or gallops.  Abdomen: Bowel sounds are normal; Abdomen is Soft; No hepatosplenomegaly, masses or hernias;  No Abdominal Tenderness; No guarding or rebound tenderness. Neuro: Alert and oriented x 3.  Grossly intact.  Psych: Alert and cooperative, normal mood and affect.   Imaging Studies: No results found.  Labs: CBC    Component Value Date/Time   WBC 9.4 08/10/2024 0913   WBC 11.3 (H) 07/26/2024 1252   RBC 3.53 (L) 08/10/2024 0913   HGB 11.3 (L) 08/10/2024 0913   HGB 15.0 01/20/2024 1630   HCT 33.0 (L) 08/10/2024 0913   HCT 44.9 01/20/2024 1630   PLT 345 08/10/2024 0913   PLT 330 01/20/2024 1630   MCV 93.5 08/10/2024 0913   MCV 95 01/20/2024 1630   MCH 32.0 08/10/2024 0913   MCHC 34.2 08/10/2024 0913   RDW 14.5 08/10/2024 0913   RDW 12.7 01/20/2024 1630   LYMPHSABS 1.6 08/10/2024 0913   LYMPHSABS 2.5 01/20/2024 1630   MONOABS 0.6 08/10/2024 0913   EOSABS 0.2 08/10/2024 0913   EOSABS 0.1 01/20/2024 1630   BASOSABS 0.1 08/10/2024 0913   BASOSABS 0.1 01/20/2024 1630    CMP     Component Value Date/Time   NA 139 08/10/2024 0913   NA 140 05/05/2024 1456   K 4.1 08/10/2024 0913   CL 109 08/10/2024 0913   CO2 27 08/10/2024 0913   GLUCOSE 107 (H) 08/10/2024 0913   BUN 15 08/10/2024 0913   BUN 17 05/05/2024 1456   CREATININE 0.80 08/10/2024 0913   CREATININE 0.70 11/22/2023 0950  CALCIUM  8.9 08/10/2024 0913   PROT 6.2 (L) 08/10/2024 0913    PROT 6.6 01/20/2024 1630   ALBUMIN 3.6 08/10/2024 0913   ALBUMIN 4.1 01/20/2024 1630   AST 17 08/10/2024 0913   ALT 21 08/10/2024 0913   ALKPHOS 60 08/10/2024 0913   BILITOT 0.2 08/10/2024 0913   GFRNONAA >60 08/10/2024 0913   GFRNONAA >89 10/28/2016 0851   GFRAA 127 12/12/2020 1149   GFRAA >89 10/28/2016 0851       Assessment and Plan:   Amber Rose is a 49 y.o. y/o female ***  Assessment and Plan Assessment & Plan       Amber Console, PA-C  Follow up ***   "

## 2024-11-06 ENCOUNTER — Encounter: Payer: Self-pay | Admitting: *Deleted

## 2024-11-23 ENCOUNTER — Encounter: Payer: Self-pay | Admitting: Internal Medicine

## 2024-11-26 ENCOUNTER — Telehealth: Payer: Self-pay | Admitting: Home Health

## 2024-11-26 NOTE — Telephone Encounter (Signed)
 Patient called after hour line, states she had a headache this morning, some pressure behind her eye, has a lot of stress, took tylenol  and has some relief. BP is 138/87 and HR 58. She is not sure if she took her Coreg  12.5mg . She is asking if she should wait or take extra dose. Explained that her BP is fair controlled currently. She can take 1/2 tablet of Coreg  12.5mg  now or wait and monitor her BP for the rest of the day, if BP elevated, take a dose of Coreg , if BP normal, just take PM scheduled dosing. Explained that skipping one dose or taking one extra dose unlikely causing catastrophic event, especially she feels well. She verbalized understanding.

## 2024-11-26 NOTE — Telephone Encounter (Signed)
 Patient called after hour line reporting headache and question about BP meds. Called the patient at [phone] twice, voice mail left for her to call back if further concern.

## 2024-11-30 ENCOUNTER — Other Ambulatory Visit: Payer: Self-pay

## 2024-12-01 ENCOUNTER — Encounter: Payer: Self-pay | Admitting: Internal Medicine

## 2024-12-01 ENCOUNTER — Other Ambulatory Visit: Payer: Self-pay

## 2024-12-07 ENCOUNTER — Other Ambulatory Visit: Admitting: Obstetrics

## 2024-12-13 ENCOUNTER — Ambulatory Visit: Admitting: Physical Therapy

## 2025-01-17 ENCOUNTER — Ambulatory Visit: Admitting: Obstetrics

## 2025-01-19 ENCOUNTER — Telehealth: Admitting: "Endocrinology

## 2025-01-30 ENCOUNTER — Ambulatory Visit

## 2025-02-09 ENCOUNTER — Inpatient Hospital Stay

## 2025-02-09 ENCOUNTER — Inpatient Hospital Stay: Admitting: Hematology and Oncology

## 2025-03-28 ENCOUNTER — Ambulatory Visit: Admitting: Family Medicine
